# Patient Record
Sex: Female | Born: 1945 | Race: White | Hispanic: No | Marital: Married | Smoking: Former smoker
Health system: Southern US, Community
[De-identification: ages and names within clinical notes are randomized; demographics above are authoritative.]

## PROBLEM LIST (undated history)

## (undated) DIAGNOSIS — J329 Chronic sinusitis, unspecified: Secondary | ICD-10-CM

## (undated) DIAGNOSIS — I712 Thoracic aortic aneurysm, without rupture: Secondary | ICD-10-CM

## (undated) DIAGNOSIS — R112 Nausea with vomiting, unspecified: Secondary | ICD-10-CM

## (undated) DIAGNOSIS — H269 Unspecified cataract: Secondary | ICD-10-CM

## (undated) DIAGNOSIS — E785 Hyperlipidemia, unspecified: Secondary | ICD-10-CM

## (undated) DIAGNOSIS — I10 Essential (primary) hypertension: Secondary | ICD-10-CM

## (undated) DIAGNOSIS — F419 Anxiety disorder, unspecified: Secondary | ICD-10-CM

## (undated) DIAGNOSIS — M1990 Unspecified osteoarthritis, unspecified site: Secondary | ICD-10-CM

## (undated) DIAGNOSIS — G8929 Other chronic pain: Secondary | ICD-10-CM

## (undated) DIAGNOSIS — I6789 Other cerebrovascular disease: Secondary | ICD-10-CM

## (undated) DIAGNOSIS — K2990 Gastroduodenitis, unspecified, without bleeding: Secondary | ICD-10-CM

## (undated) DIAGNOSIS — E119 Type 2 diabetes mellitus without complications: Secondary | ICD-10-CM

## (undated) DIAGNOSIS — R0602 Shortness of breath: Secondary | ICD-10-CM

## (undated) DIAGNOSIS — Z9841 Cataract extraction status, right eye: Secondary | ICD-10-CM

## (undated) DIAGNOSIS — Z8673 Personal history of transient ischemic attack (TIA), and cerebral infarction without residual deficits: Secondary | ICD-10-CM

## (undated) DIAGNOSIS — R7989 Other specified abnormal findings of blood chemistry: Secondary | ICD-10-CM

## (undated) DIAGNOSIS — J309 Allergic rhinitis, unspecified: Secondary | ICD-10-CM

## (undated) DIAGNOSIS — I639 Cerebral infarction, unspecified: Secondary | ICD-10-CM

## (undated) DIAGNOSIS — K449 Diaphragmatic hernia without obstruction or gangrene: Secondary | ICD-10-CM

## (undated) DIAGNOSIS — R05 Cough: Secondary | ICD-10-CM

## (undated) DIAGNOSIS — F17200 Nicotine dependence, unspecified, uncomplicated: Secondary | ICD-10-CM

## (undated) DIAGNOSIS — T7840XA Allergy, unspecified, initial encounter: Secondary | ICD-10-CM

## (undated) DIAGNOSIS — M549 Dorsalgia, unspecified: Secondary | ICD-10-CM

## (undated) DIAGNOSIS — J069 Acute upper respiratory infection, unspecified: Secondary | ICD-10-CM

## (undated) DIAGNOSIS — G500 Trigeminal neuralgia: Secondary | ICD-10-CM

## (undated) DIAGNOSIS — I2510 Atherosclerotic heart disease of native coronary artery without angina pectoris: Secondary | ICD-10-CM

## (undated) DIAGNOSIS — D649 Anemia, unspecified: Secondary | ICD-10-CM

## (undated) DIAGNOSIS — I509 Heart failure, unspecified: Secondary | ICD-10-CM

## (undated) DIAGNOSIS — M5481 Occipital neuralgia: Secondary | ICD-10-CM

## (undated) DIAGNOSIS — F329 Major depressive disorder, single episode, unspecified: Secondary | ICD-10-CM

## (undated) DIAGNOSIS — K253 Acute gastric ulcer without hemorrhage or perforation: Secondary | ICD-10-CM

## (undated) DIAGNOSIS — N189 Chronic kidney disease, unspecified: Secondary | ICD-10-CM

## (undated) DIAGNOSIS — G459 Transient cerebral ischemic attack, unspecified: Secondary | ICD-10-CM

## (undated) DIAGNOSIS — D3000 Benign neoplasm of unspecified kidney: Secondary | ICD-10-CM

## (undated) DIAGNOSIS — J449 Chronic obstructive pulmonary disease, unspecified: Secondary | ICD-10-CM

## (undated) DIAGNOSIS — E559 Vitamin D deficiency, unspecified: Secondary | ICD-10-CM

## (undated) DIAGNOSIS — Z9842 Cataract extraction status, left eye: Secondary | ICD-10-CM

## (undated) DIAGNOSIS — M542 Cervicalgia: Secondary | ICD-10-CM

## (undated) DIAGNOSIS — G479 Sleep disorder, unspecified: Secondary | ICD-10-CM

## (undated) DIAGNOSIS — N183 Chronic kidney disease, stage 3 unspecified: Secondary | ICD-10-CM

## (undated) DIAGNOSIS — Z85528 Personal history of other malignant neoplasm of kidney: Secondary | ICD-10-CM

## (undated) DIAGNOSIS — K219 Gastro-esophageal reflux disease without esophagitis: Secondary | ICD-10-CM

## (undated) DIAGNOSIS — S42111A Displaced fracture of body of scapula, right shoulder, initial encounter for closed fracture: Secondary | ICD-10-CM

## (undated) DIAGNOSIS — K589 Irritable bowel syndrome without diarrhea: Secondary | ICD-10-CM

## (undated) DIAGNOSIS — Z7902 Long term (current) use of antithrombotics/antiplatelets: Secondary | ICD-10-CM

## (undated) DIAGNOSIS — I4719 Other supraventricular tachycardia: Secondary | ICD-10-CM

## (undated) DIAGNOSIS — G2581 Restless legs syndrome: Secondary | ICD-10-CM

## (undated) DIAGNOSIS — J439 Emphysema, unspecified: Secondary | ICD-10-CM

## (undated) DIAGNOSIS — R0683 Snoring: Secondary | ICD-10-CM

## (undated) DIAGNOSIS — Z86010 Personal history of colonic polyps: Secondary | ICD-10-CM

## (undated) DIAGNOSIS — I739 Peripheral vascular disease, unspecified: Secondary | ICD-10-CM

## (undated) DIAGNOSIS — Z8739 Personal history of other diseases of the musculoskeletal system and connective tissue: Secondary | ICD-10-CM

## (undated) DIAGNOSIS — F319 Bipolar disorder, unspecified: Secondary | ICD-10-CM

## (undated) DIAGNOSIS — I700 Atherosclerosis of aorta: Secondary | ICD-10-CM

## (undated) DIAGNOSIS — I5030 Unspecified diastolic (congestive) heart failure: Secondary | ICD-10-CM

## (undated) DIAGNOSIS — K2970 Gastritis, unspecified, without bleeding: Secondary | ICD-10-CM

## (undated) DIAGNOSIS — C801 Malignant (primary) neoplasm, unspecified: Secondary | ICD-10-CM

## (undated) DIAGNOSIS — R1310 Dysphagia, unspecified: Secondary | ICD-10-CM

## (undated) DIAGNOSIS — R42 Dizziness and giddiness: Secondary | ICD-10-CM

## (undated) HISTORY — DX: Trigeminal neuralgia: G50.0

## (undated) HISTORY — DX: Bipolar disorder, unspecified: F31.9

## (undated) HISTORY — DX: Acute gastric ulcer without hemorrhage or perforation: K25.3

## (undated) HISTORY — DX: Nicotine dependence, unspecified, uncomplicated: F17.200

## (undated) HISTORY — DX: Vitamin D deficiency, unspecified: E55.9

## (undated) HISTORY — DX: Unspecified osteoarthritis, unspecified site: M19.90

## (undated) HISTORY — DX: Allergy, unspecified, initial encounter: T78.40XA

## (undated) HISTORY — PX: RECTOCELE REPAIR: SHX761

## (undated) HISTORY — DX: Gastritis, unspecified, without bleeding: K29.70

## (undated) HISTORY — DX: Allergic rhinitis, unspecified: J30.9

## (undated) HISTORY — DX: Dizziness and giddiness: R42

## (undated) HISTORY — DX: Diaphragmatic hernia without obstruction or gangrene: K44.9

## (undated) HISTORY — DX: Sleep disorder, unspecified: G47.9

## (undated) HISTORY — DX: Anxiety disorder, unspecified: F41.9

## (undated) HISTORY — DX: Cough: R05

## (undated) HISTORY — DX: Restless legs syndrome: G25.81

## (undated) HISTORY — DX: Anemia, unspecified: D64.9

## (undated) HISTORY — DX: Dorsalgia, unspecified: M54.9

## (undated) HISTORY — DX: Chronic sinusitis, unspecified: J32.9

## (undated) HISTORY — DX: Atherosclerotic heart disease of native coronary artery without angina pectoris: I25.10

## (undated) HISTORY — PX: ESOPHAGOGASTRODUODENOSCOPY: SHX1529

## (undated) HISTORY — DX: Major depressive disorder, single episode, unspecified: F32.9

## (undated) HISTORY — PX: ORTHOPEDIC SURGERY: SHX850

## (undated) HISTORY — DX: Chronic kidney disease, unspecified: N18.9

## (undated) HISTORY — PX: CATARACT EXTRACTION: SUR2

## (undated) HISTORY — DX: Type 2 diabetes mellitus without complications: E11.9

## (undated) HISTORY — DX: Acute upper respiratory infection, unspecified: J06.9

## (undated) HISTORY — DX: Dysphagia, unspecified: R13.10

## (undated) HISTORY — PX: UPPER GASTROINTESTINAL ENDOSCOPY: SHX188

## (undated) HISTORY — DX: Personal history of other diseases of the musculoskeletal system and connective tissue: Z87.39

## (undated) HISTORY — DX: Gastroduodenitis, unspecified, without bleeding: K29.90

## (undated) HISTORY — DX: Thoracic aortic aneurysm, without rupture: I71.2

## (undated) HISTORY — DX: Occipital neuralgia: M54.81

## (undated) HISTORY — PX: CYSTOCELE REPAIR: SHX163

## (undated) HISTORY — DX: Unspecified cataract: H26.9

## (undated) HISTORY — DX: Other chronic pain: G89.29

## (undated) HISTORY — DX: Personal history of colonic polyps: Z86.010

## (undated) HISTORY — DX: Cervicalgia: M54.2

## (undated) HISTORY — DX: Shortness of breath: R06.02

## (undated) HISTORY — PX: COLONOSCOPY: SHX174

## (undated) HISTORY — PX: KIDNEY SURGERY: SHX687

## (undated) HISTORY — DX: Nausea with vomiting, unspecified: R11.2

## (undated) HISTORY — DX: Snoring: R06.83

## (undated) HISTORY — PX: APPENDECTOMY: SHX54

## (undated) HISTORY — DX: Personal history of transient ischemic attack (TIA), and cerebral infarction without residual deficits: Z86.73

## (undated) HISTORY — DX: Other specified abnormal findings of blood chemistry: R79.89

## (undated) HISTORY — DX: Irritable bowel syndrome without diarrhea: K58.9

## (undated) HISTORY — DX: Personal history of other malignant neoplasm of kidney: Z85.528

## (undated) HISTORY — PX: POLYPECTOMY: SHX149

## (undated) HISTORY — PX: ABDOMINAL HYSTERECTOMY: SHX81

---

## 1966-02-26 HISTORY — PX: TONSILECTOMY, ADENOIDECTOMY, BILATERAL MYRINGOTOMY AND TUBES: SHX2538

## 2004-01-25 ENCOUNTER — Encounter: Admission: RE | Admit: 2004-01-25 | Discharge: 2004-01-25 | Payer: Self-pay | Admitting: Internal Medicine

## 2004-02-16 ENCOUNTER — Encounter: Admission: RE | Admit: 2004-02-16 | Discharge: 2004-02-16 | Payer: Self-pay | Admitting: Orthopedic Surgery

## 2005-03-20 ENCOUNTER — Ambulatory Visit (HOSPITAL_COMMUNITY): Admission: AD | Admit: 2005-03-20 | Discharge: 2005-03-23 | Payer: Self-pay | Admitting: Orthopedic Surgery

## 2005-03-20 ENCOUNTER — Encounter (INDEPENDENT_AMBULATORY_CARE_PROVIDER_SITE_OTHER): Payer: Self-pay | Admitting: *Deleted

## 2006-04-08 ENCOUNTER — Inpatient Hospital Stay (HOSPITAL_COMMUNITY): Admission: EM | Admit: 2006-04-08 | Discharge: 2006-04-09 | Payer: Self-pay | Admitting: Emergency Medicine

## 2006-04-09 ENCOUNTER — Ambulatory Visit: Payer: Self-pay | Admitting: *Deleted

## 2007-03-26 ENCOUNTER — Encounter: Admission: RE | Admit: 2007-03-26 | Discharge: 2007-03-26 | Payer: Self-pay | Admitting: Family Medicine

## 2007-03-31 ENCOUNTER — Encounter: Admission: RE | Admit: 2007-03-31 | Discharge: 2007-03-31 | Payer: Self-pay | Admitting: Family Medicine

## 2007-04-04 ENCOUNTER — Ambulatory Visit: Payer: Self-pay | Admitting: Internal Medicine

## 2007-04-04 DIAGNOSIS — J309 Allergic rhinitis, unspecified: Secondary | ICD-10-CM | POA: Insufficient documentation

## 2007-04-04 DIAGNOSIS — G2581 Restless legs syndrome: Secondary | ICD-10-CM

## 2007-04-04 DIAGNOSIS — M549 Dorsalgia, unspecified: Secondary | ICD-10-CM

## 2007-04-04 DIAGNOSIS — F319 Bipolar disorder, unspecified: Secondary | ICD-10-CM | POA: Insufficient documentation

## 2007-04-04 DIAGNOSIS — M1990 Unspecified osteoarthritis, unspecified site: Secondary | ICD-10-CM

## 2007-04-04 DIAGNOSIS — J449 Chronic obstructive pulmonary disease, unspecified: Secondary | ICD-10-CM | POA: Insufficient documentation

## 2007-04-04 DIAGNOSIS — K589 Irritable bowel syndrome without diarrhea: Secondary | ICD-10-CM

## 2007-04-04 HISTORY — DX: Bipolar disorder, unspecified: F31.9

## 2007-04-04 HISTORY — DX: Unspecified osteoarthritis, unspecified site: M19.90

## 2007-04-04 HISTORY — DX: Allergic rhinitis, unspecified: J30.9

## 2007-04-04 HISTORY — DX: Dorsalgia, unspecified: M54.9

## 2007-04-04 HISTORY — DX: Restless legs syndrome: G25.81

## 2007-04-04 HISTORY — DX: Irritable bowel syndrome, unspecified: K58.9

## 2007-04-07 ENCOUNTER — Ambulatory Visit: Payer: Self-pay | Admitting: Internal Medicine

## 2007-04-07 DIAGNOSIS — K253 Acute gastric ulcer without hemorrhage or perforation: Secondary | ICD-10-CM | POA: Insufficient documentation

## 2007-04-07 DIAGNOSIS — K449 Diaphragmatic hernia without obstruction or gangrene: Secondary | ICD-10-CM

## 2007-04-07 DIAGNOSIS — K2990 Gastroduodenitis, unspecified, without bleeding: Secondary | ICD-10-CM

## 2007-04-07 DIAGNOSIS — K2970 Gastritis, unspecified, without bleeding: Secondary | ICD-10-CM

## 2007-04-07 HISTORY — DX: Gastritis, unspecified, without bleeding: K29.70

## 2007-04-07 HISTORY — DX: Acute gastric ulcer without hemorrhage or perforation: K25.3

## 2007-04-07 HISTORY — DX: Diaphragmatic hernia without obstruction or gangrene: K44.9

## 2007-05-13 ENCOUNTER — Ambulatory Visit: Payer: Self-pay | Admitting: Internal Medicine

## 2007-05-29 ENCOUNTER — Ambulatory Visit (HOSPITAL_COMMUNITY): Admission: RE | Admit: 2007-05-29 | Discharge: 2007-05-29 | Payer: Self-pay | Admitting: Internal Medicine

## 2007-05-29 ENCOUNTER — Ambulatory Visit: Payer: Self-pay | Admitting: Internal Medicine

## 2007-05-29 ENCOUNTER — Encounter: Payer: Self-pay | Admitting: Internal Medicine

## 2007-05-29 DIAGNOSIS — Z860100 Personal history of colon polyps, unspecified: Secondary | ICD-10-CM | POA: Insufficient documentation

## 2007-10-29 ENCOUNTER — Telehealth: Payer: Self-pay | Admitting: Internal Medicine

## 2007-11-19 ENCOUNTER — Encounter: Admission: RE | Admit: 2007-11-19 | Discharge: 2007-11-19 | Payer: Self-pay | Admitting: Urology

## 2007-12-17 ENCOUNTER — Ambulatory Visit: Payer: Self-pay | Admitting: Internal Medicine

## 2007-12-17 DIAGNOSIS — F17200 Nicotine dependence, unspecified, uncomplicated: Secondary | ICD-10-CM

## 2007-12-17 HISTORY — DX: Nicotine dependence, unspecified, uncomplicated: F17.200

## 2007-12-18 ENCOUNTER — Telehealth (INDEPENDENT_AMBULATORY_CARE_PROVIDER_SITE_OTHER): Payer: Self-pay

## 2007-12-24 ENCOUNTER — Ambulatory Visit: Payer: Self-pay | Admitting: Internal Medicine

## 2007-12-24 ENCOUNTER — Ambulatory Visit: Admission: RE | Admit: 2007-12-24 | Discharge: 2007-12-24 | Payer: Self-pay | Admitting: Internal Medicine

## 2007-12-24 DIAGNOSIS — R0602 Shortness of breath: Secondary | ICD-10-CM | POA: Insufficient documentation

## 2007-12-24 HISTORY — DX: Shortness of breath: R06.02

## 2007-12-29 ENCOUNTER — Ambulatory Visit: Payer: Self-pay | Admitting: Internal Medicine

## 2007-12-29 DIAGNOSIS — J329 Chronic sinusitis, unspecified: Secondary | ICD-10-CM | POA: Insufficient documentation

## 2007-12-29 HISTORY — DX: Chronic sinusitis, unspecified: J32.9

## 2008-02-02 ENCOUNTER — Ambulatory Visit (HOSPITAL_COMMUNITY): Admission: RE | Admit: 2008-02-02 | Discharge: 2008-02-03 | Payer: Self-pay | Admitting: Urology

## 2008-02-15 ENCOUNTER — Observation Stay (HOSPITAL_COMMUNITY): Admission: EM | Admit: 2008-02-15 | Discharge: 2008-02-16 | Payer: Self-pay | Admitting: Emergency Medicine

## 2008-02-25 ENCOUNTER — Emergency Department (HOSPITAL_COMMUNITY): Admission: EM | Admit: 2008-02-25 | Discharge: 2008-02-26 | Payer: Self-pay | Admitting: Emergency Medicine

## 2008-02-26 ENCOUNTER — Inpatient Hospital Stay (HOSPITAL_COMMUNITY): Admission: AD | Admit: 2008-02-26 | Discharge: 2008-03-03 | Payer: Self-pay | Admitting: Urology

## 2008-06-01 ENCOUNTER — Ambulatory Visit (HOSPITAL_COMMUNITY): Admission: RE | Admit: 2008-06-01 | Discharge: 2008-06-01 | Payer: Self-pay | Admitting: Urology

## 2008-11-02 ENCOUNTER — Encounter: Admission: RE | Admit: 2008-11-02 | Discharge: 2008-11-02 | Payer: Self-pay | Admitting: Urology

## 2008-12-10 ENCOUNTER — Inpatient Hospital Stay (HOSPITAL_COMMUNITY): Admission: RE | Admit: 2008-12-10 | Discharge: 2008-12-12 | Payer: Self-pay | Admitting: Urology

## 2009-01-07 ENCOUNTER — Ambulatory Visit (HOSPITAL_COMMUNITY): Admission: RE | Admit: 2009-01-07 | Discharge: 2009-01-08 | Payer: Self-pay | Admitting: Interventional Radiology

## 2009-01-07 ENCOUNTER — Encounter (INDEPENDENT_AMBULATORY_CARE_PROVIDER_SITE_OTHER): Payer: Self-pay | Admitting: Interventional Radiology

## 2009-02-02 ENCOUNTER — Telehealth (INDEPENDENT_AMBULATORY_CARE_PROVIDER_SITE_OTHER): Payer: Self-pay | Admitting: *Deleted

## 2009-02-04 ENCOUNTER — Ambulatory Visit (HOSPITAL_COMMUNITY): Admission: RE | Admit: 2009-02-04 | Discharge: 2009-02-04 | Payer: Self-pay | Admitting: Interventional Radiology

## 2009-02-08 ENCOUNTER — Encounter: Admission: RE | Admit: 2009-02-08 | Discharge: 2009-02-08 | Payer: Self-pay | Admitting: Interventional Radiology

## 2009-06-28 ENCOUNTER — Ambulatory Visit (HOSPITAL_COMMUNITY): Admission: RE | Admit: 2009-06-28 | Discharge: 2009-06-28 | Payer: Self-pay | Admitting: Interventional Radiology

## 2009-06-28 ENCOUNTER — Encounter: Admission: RE | Admit: 2009-06-28 | Discharge: 2009-06-28 | Payer: Self-pay | Admitting: Interventional Radiology

## 2009-06-30 ENCOUNTER — Ambulatory Visit: Payer: Self-pay | Admitting: Cardiology

## 2009-06-30 ENCOUNTER — Inpatient Hospital Stay (HOSPITAL_COMMUNITY): Admission: EM | Admit: 2009-06-30 | Discharge: 2009-07-05 | Payer: Self-pay | Admitting: Emergency Medicine

## 2009-07-01 ENCOUNTER — Ambulatory Visit: Payer: Self-pay | Admitting: Vascular Surgery

## 2009-07-01 ENCOUNTER — Ambulatory Visit: Payer: Self-pay | Admitting: Physical Medicine & Rehabilitation

## 2009-07-01 ENCOUNTER — Encounter (INDEPENDENT_AMBULATORY_CARE_PROVIDER_SITE_OTHER): Payer: Self-pay | Admitting: Pediatrics

## 2009-07-04 ENCOUNTER — Encounter (INDEPENDENT_AMBULATORY_CARE_PROVIDER_SITE_OTHER): Payer: Self-pay | Admitting: Neurology

## 2009-07-04 ENCOUNTER — Encounter (INDEPENDENT_AMBULATORY_CARE_PROVIDER_SITE_OTHER): Payer: Self-pay | Admitting: Pediatrics

## 2009-12-12 ENCOUNTER — Observation Stay (HOSPITAL_COMMUNITY): Admission: EM | Admit: 2009-12-12 | Discharge: 2009-12-16 | Payer: Self-pay | Admitting: Emergency Medicine

## 2009-12-12 ENCOUNTER — Ambulatory Visit: Payer: Self-pay | Admitting: Internal Medicine

## 2009-12-15 ENCOUNTER — Encounter: Payer: Self-pay | Admitting: Cardiology

## 2010-03-30 NOTE — Miscellaneous (Signed)
Summary: Orders Update pft charges  Clinical Lists Changes  Orders: Added new Service order of Carbon Monoxide diffusing w/capacity (94720) - Signed Added new Service order of Lung Volumes (94240) - Signed Added new Service order of Spirometry (Pre & Post) (94060) - Signed 

## 2010-03-30 NOTE — Assessment & Plan Note (Signed)
Summary: 6 min walk   Nurse Visit   Vital Signs:  Patient Profile:   65 Years Old Female Height:     65 inches Pulse rate:   73 / minute BP sitting:   132 / 72                 Prior Medications: ADVAIR DISKUS 500-50 MCG/DOSE MISC (FLUTICASONE-SALMETEROL) 1 puff once daily CRESTOR 10 MG TABS (ROSUVASTATIN CALCIUM) once daily NITROFURANTOIN MONOHYD MACRO 100 MG CAPS (NITROFURANTOIN MONOHYD MACRO) 1 tab two times a day VERAMYST 27.5 MCG/SPRAY SUSP (FLUTICASONE FUROATE) as needed FLONASE 50 MCG/ACT SUSP (FLUTICASONE PROPIONATE) as needed VENTOLIN HFA 108 (90 BASE) MCG/ACT AERS (ALBUTEROL SULFATE) as needed NEXIUM 20 MG CPDR (ESOMEPRAZOLE MAGNESIUM) once daily Current Allergies: No known allergies     Orders Added: 1)  Pulmonary Stress (6 min walk) [email or web address]    ]  Six Minute Walk Test Medications taken before test(dose and time): none this morning Supplemental oxygen during the test: No  Lap counter(place a tick mark inside a square for each lap completed) lap 1 complete  lap 2 complete   lap 3 complete   lap 4 complete  lap 5 complete  lap 6 complete  lap 7 complete    Baseline  BP sitting: 132/ 72 Heart rate: 73 Dyspnea ( Borg scale) 0 Fatigue (Borg scale) 0 SPO2 97  End Of Test  BP sitting: 134/ 74 Heart rate: 89 Dyspnea ( Borg scale) 0 Fatigue (Borg scale) 0 SPO2 97  2 Minutes post  BP sitting: 130/ 72 Heart rate: 76 SPO2 97  Stopped or paused before six minutes? No  Interpretation: Number of laps  7 X 48 meters =   336 meters =    meters   Total distance walked in six minutes: 336 meters  Tech ID: Tivis Ringer (December 24, 2007 11:44 AM) Jeremy Johann Comments pt completed test w/ 0 rest breaks and 0 complaints

## 2010-03-30 NOTE — Assessment & Plan Note (Signed)
Summary: pre-op clearance/apc   Visit Type:  Initial Consult Referred by:  Dr. Patsi [facility] PCP:  Dr. Kandis Mannan, Dr. [facility]  Chief Complaint:  pulmonary consult......Marland Kitchenreviewed meds......Marland Kitchenneeds clearnace for surgery.  History of Present Illness: IOV 12/17/2007: 65 year old heavy smoker, normal cxr Jan 2007, obese bmi 31, history of mold exposure in 2001 followed briefly at [facility], has had diagnosis of renal mass and urinary incontinence and vaginal prolapse per history. Due to undergo pelvic floor reconstruction surgery and presents for pre-operative evaluation.  She has chronic cough with some white sputum at baseline. Few weeks/months ago had URTI and had worsening of cough in the post-viral phase but now slowly returning to baseline. In addition, has chronic doe for 1 flight of stairs and lifting heavy objects at work. Also, has chronic sinus drainage. She continues to smoke and has had difficulty quitting. As part of pre-op pulm eval she says she had office spiro at PMD's office which showed "33%" and was placed on advair.   :  Comments: NOTE BELOW WAS ENTERED IN ERROR.......ON THE WRONG ACCT.   Serial Vital Signs/Assessments:  Comments: Ambulatory Pulse Oximetry  Resting; HR_75____    02 Sat_94____  Lap1 (185 feet)   HR_81____   02 Sat_94____ Lap2 (185 feet)   HR_84____   02 Sat_93____    Lap3 (185 feet)   HR_72____   02 Sat_94____  _x__Test Completed without Difficulty ___Test Stopped due to:  Clarise Cruz Duncan Dull)  December 17, 2007 3:06 PM  By: Clarise Cruz Duncan Dull)      Updated Prior Medication List: ADVAIR DISKUS 500-50 MCG/DOSE MISC (FLUTICASONE-SALMETEROL) 1 puff once daily CRESTOR 10 MG TABS (ROSUVASTATIN CALCIUM) once daily NITROFURANTOIN MONOHYD MACRO 100 MG CAPS (NITROFURANTOIN MONOHYD MACRO) 1 tab two times a day VERAMYST 27.5 MCG/SPRAY SUSP (FLUTICASONE FUROATE) as needed FLONASE 50 MCG/ACT SUSP (FLUTICASONE  PROPIONATE) as needed VENTOLIN HFA 108 (90 BASE) MCG/ACT AERS (ALBUTEROL SULFATE) as needed NEXIUM 20 MG CPDR (ESOMEPRAZOLE MAGNESIUM) once daily  Current Allergies: No known allergies   Past Medical History:    IRRITABLE BOWEL SYNDROME (ICD-564.1)    OSTEOARTHRITIS (ICD-715.90)    BACK PAIN, CHRONIC (ICD-724.5)    RHINOSINUSITIS, 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  Diabetes in her sister    and father.  Coronary artery bypass surgery in father and mother.    Thyroid problems in her mother.  No colon cancer.       Social History:    married lives with husband and son    2 dogs and 7 wolves    works at Product/process development scientist at [employer]      She is married.  She works in Investment banker, operational at [employer].    [place].  She continues to smoke a pack per day  There is no    alcohol or drug use.    Risk Factors:  Tobacco use:  current    Year started:  35 yrs  Cigarettes:  Yes -- 2 pack(s) per day Alcohol use:  no   Review of Systems       The patient complains of dyspnea on exertion, prolonged cough, severe indigestion/heartburn, and incontinence.  The patient denies anorexia, fever, weight loss, weight gain, vision loss, decreased hearing, hoarseness, chest pain, syncope, peripheral edema, headaches, hemoptysis, abdominal pain, melena, hematochezia, hematuria, genital sores, muscle weakness, suspicious skin lesions, transient blindness, difficulty walking, depression, unusual weight change, abnormal bleeding, enlarged lymph nodes, angioedema, breast masses, and testicular masses.     Vital  Signs:  Patient Profile:   65 Years Old Female Height:     65 inches Weight:      186.25 pounds BMI:     31.11 O2 Sat:      94 % O2 treatment:    Room Air Temp:     97.8 degrees F oral Pulse rate:   82 / minute BP sitting:   110 / 70  (left arm) Cuff size:   regular  Pt. in pain?   no  Vitals Entered By: Clarise Cruz Duncan Dull) (December 17, 2007 2:35 PM)                  Physical Exam  General:     well developed, well nourished, in no acute distressobese.   Head:     normocephalic and atraumatic Eyes:     PERRLA/EOM intact; conjunctiva and sclera clear Ears:     TMs intact and clear with normal canals Nose:     no deformity, discharge, inflammation, or lesions Mouth:     no deformity or lesions Neck:     no masses, thyromegaly, or abnormal cervical nodes Chest Wall:     no deformities noted Lungs:     coarse BS throughout and prolonged exhilation.   Heart:     regular rate and rhythm, S1, S2 without murmurs, rubs, gallops, or clicks Abdomen:     bowel sounds positive; abdomen soft and non-tender without masses, or organomegaly Msk:     no deformity or scoliosis noted with normal posture Pulses:     pulses normal Extremities:     no clubbing, cyanosis, edema, or deformity noted Neurologic:     CN II-XII grossly intact with normal reflexes, coordination, muscle strength and tone Skin:     intact without lesions or rashes Cervical Nodes:     no significant adenopathy Axillary Nodes:     no significant adenopathy Psych:     alert and cooperative; normal mood and affect; normal attention span and concentration    Pulmonary Function Test Height (in.): 65 Gender: Female    Impression & Recommendations:  Problem # 1:  PREOPERATIVE EXAMINATION (ICD-V72.84) Assessment: New Smoker, obese, likely copd, age 56: already at moderate risk for pulmonary complications from surgery. She ideally needs to quit smoking for >6 weeks prior to surgery but she is  unwilling to do that because she is keen to have surgery soon  plan get full pfts get 6 minute walk test get abg on room air better risk quantification after above tests continue advair    Orders: Consultation Level V (21308) Pulmonary Referral (Pulmonary) Consultation Level V (65784)   Problem # 2:  COPD (ICD-496) get pft's to diagnose and assess severity Her updated medication list for this problem includes:    Advair Diskus 500-50 Mcg/dose Misc (Fluticasone-salmeterol) .[name]... 1 puff once daily    Ventolin Hfa 108 (90 Base) Mcg/act Aers (Albuterol sulfate) .[name]... As needed  Orders: Consultation Level V [id number]) Pulmonary Referral (Pulmonary) Consultation Level V (32951)   Problem # 5:  TOBACCO ABUSE (ICD-305.1) Assessment: New spent 5 minutes counselling on hazards of smoking. She is unwilling to quit smoking as of now. She feels she will get nicotine patch and chantix in post-op period and that should suffice  plan will continue to encourage quitting smoking pre-op Orders: Consultation Level V (88416)   Medications Added to Medication List This Visit: 1)  Advair Diskus 500-50 Mcg/dose Misc (Fluticasone-salmeterol) .[name].. 1 puff once daily 2)  Crestor 10 Mg Tabs (Rosuvastatin calcium) .... Once daily 3)  Nitrofurantoin Monohyd Macro 100 Mg Caps (Nitrofurantoin monohyd macro) .[name].. 1 tab two times a day 4)  Veramyst 27.5 Mcg/spray Susp (Fluticasone furoate) .... As needed 5)  Flonase 50 Mcg/act Susp (Fluticasone propionate) .... As needed 6)  Ventolin Hfa 108 (90 Base) Mcg/act Aers (Albuterol sulfate) .... As needed 7)  Nexium 20 Mg Cpdr (Esomeprazole magnesium) .... Once daily   Patient Instructions: 1)  please have full pfts and walking test at [place] office 2)  pleaes have blood gas test at [facility] 3)  return after above   ]

## 2010-03-30 NOTE — Op Note (Signed)
Summary: EGD    Upper Endoscopy  Procedure date:  04/07/2007  Findings:      1) MULTIPLE ANTRAL ULCERS AND PYLORIC INFLAMMATION. I THINK THEY ARE FROM NSAIDS BASED UPON SIZE, #, APPEARANCE AND HX OF NSAID USE. H. PYLORI AB WAS ALREADY NEGATIVE. 2) 2 CM HIATAL HERNIA 3) OTHERWISE NEGATIVE  Comments:      STOP NSAIDS TAKE NEXIUM 2 TIMES/DAY FOR 1 MONTH RETURN OFFICE VISIT 6-8 WKS

## 2010-03-30 NOTE — Progress Notes (Signed)
Summary: appt  Phone Note Call from Patient   Caller: Patient Call For: [email or web address] Summary of Call: returning your call  Initial call taken by: Rickard Patience,  December 18, 2007 8:26 AM  Follow-up for Phone Call        scheduled pt to see MR on 11/2 at 12:00 Follow-up by: Philipp Deputy CMA,  December 18, 2007 11:38 AM

## 2010-03-30 NOTE — Progress Notes (Signed)
Summary: ULCER ATTACK   Phone Note Call from Patient Call back at [phone] WORK #   Call For: DR Obbie Lewallen Reason for Call: Talk to Nurse Summary of Call: HAVING ULCER ATTACKS. WANTS TO KNOW WHAT TO TAKE TO EASE THE PAIN. USES [facility] ON [address]. Initial call taken by: Leanor [facility],  October 29, 2007 9:14 AM  Follow-up for Phone Call        pt having reflux and pain, Pt not taking nexium properly.  Pt instructed to maintain anti-reflux diet. Advised to avoid caffeine, mint, citrus foods/juices, tomatoes, chocolate. Instructed not to eat within 2 hours of exercise or bed, small meals are better than 3 large. Need to take PPI 30 minutes prior to 1st meal of the day and at bedtime.  Pt instructed to call back if no improvment with dietary and medication changes.  . Follow-up by: Darcey Nora RN,  October 29, 2007 10:57 AM

## 2010-03-30 NOTE — Assessment & Plan Note (Signed)
Summary: rov/to discuss results//td   Visit Type:  Follow-up Referred by:  Dr. Patsi [facility] PCP:  Dr. Kandis Mannan, Dr. [facility]  Chief Complaint:  fu visit.....Kerry Kitchenno problems...reviewed meds...Kerry Kitchenneeds surgery for clearance.......  History of Present Illness: IOV 12/17/2007: 65 year old heavy smoker, normal cxr Jan 2007, obese bmi 31, history of mold exposure in 2001 followed briefly at [facility], has had diagnosis of renal mass and urinary incontinence and vaginal prolapse per history. Due to undergo pelvic floor reconstruction surgery and presents for pre-operative evaluation.  She has chronic cough with some white sputum at baseline. Few weeks/months ago had URTI and had worsening of cough in the post-viral phase but now slowly returning to baseline. In addition, has chronic doe for 1 flight of stairs and lifting heavy objects at work. Also, has chronic sinus drainage. She continues to smoke and has had difficulty quitting. As part of pre-op pulm eval she says she had office spiro at PMD's office which showed "33%" and was placed on advair. REC: PFts, , abg prior to surgery  Followup OV: 12/29/2007: No new symptoms. Stil with DOE for 1 flight stairs; taking advair. Still with sinus drainge despite flonase and veramyst. Here to review test results.  Presents with husband.  Full PFTs 12/24/2007 - is normal. FEv1 2.43/106%, FVC 3.35/107%, Ratio 73 73), TLC 4.4L/86%, DLCO 18/79%. 6 minute walk test 12/24/2007 is normal - 336 meters, no desaturation, no dyspnea/fatigue, normal heart rate and bp response is normal. ABG on room air 12/24/2007  - shows only hypoxemia for age;  31.42/40/65     Updated Prior Medication List: ADVAIR DISKUS 500-50 MCG/DOSE MISC (FLUTICASONE-SALMETEROL) 1 puff once daily CRESTOR 10 MG TABS (ROSUVASTATIN CALCIUM) once daily NITROFURANTOIN MONOHYD MACRO 100 MG CAPS (NITROFURANTOIN MONOHYD MACRO) 1 tab two times a day VERAMYST 27.5 MCG/SPRAY SUSP  (FLUTICASONE FUROATE) as needed FLONASE 50 MCG/ACT SUSP (FLUTICASONE PROPIONATE) as needed VENTOLIN HFA 108 (90 BASE) MCG/ACT AERS (ALBUTEROL SULFATE) as needed NEXIUM 20 MG CPDR (ESOMEPRAZOLE MAGNESIUM) once daily  Current Allergies (reviewed today): No known allergies   Past Medical History:    IRRITABLE BOWEL SYNDROME (ICD-564.1)    OSTEOARTHRITIS (ICD-715.90)    BACK PAIN, CHRONIC (ICD-724.5)    RHINOSINUSITIS, 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 Incomplete Colonoscopy 2005   Family History:    Reviewed history from 12/17/2007 and no changes required:       heart attack- father       Ovarian cancer-mother       asthma-mother       allergies-sister                Ovarian cancer in her mother.  Diabetes in her sister       and father.  Coronary artery bypass surgery in father and mother.       Thyroid problems in her mother.  No colon cancer.  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  She works in Investment banker, operational at [employer].       [place].  She continues to smoke a pack per day  There is no       alcohol or drug use.    Risk Factors: Tobacco use:  current    Year started:  35 yrs    Cigarettes:  Yes -- 2 pack(s) per day Alcohol use:  no   Review of Systems      See HPI   Vital Signs:  Patient Profile:   65 Years Old Female Height:     65 inches Weight:       188.50 pounds BMI:     31.48 O2 Sat:      95 % O2 treatment:    Room Air Temp:     97.7 degrees F oral Pulse rate:   79 / minute BP sitting:   120 / 60  (left arm) Cuff size:   regular  Pt. in pain?   no  Vitals Entered By: Clarise Cruz Duncan Dull) (December 29, 2007 12:32 PM)                  Physical Exam  General:     well developed, well nourished, in no acute distressobese.   Head:     normocephalic and atraumatic Eyes:     PERRLA/EOM intact; conjunctiva and sclera clear Ears:     TMs intact and clear with normal canals Nose:     no deformity, discharge, inflammation, or lesions Mouth:     no deformity or lesions Neck:     no masses, thyromegaly, or abnormal cervical nodes Chest Wall:     no deformities noted Lungs:     coarse BS throughout and prolonged exhilation.   Heart:     regular rate and rhythm, S1, S2 without murmurs, rubs, gallops, or clicks Abdomen:     bowel sounds positive; abdomen soft and non-tender without masses, or organomegaly Msk:     no deformity or scoliosis noted with normal posture Pulses:     pulses normal Extremities:     no clubbing, cyanosis, edema, or deformity noted Neurologic:     CN II-XII grossly intact with normal reflexes, coordination, muscle strength and tone Skin:     intact without lesions or rashes Cervical Nodes:     no significant adenopathy Axillary Nodes:     no significant adenopathy Psych:     alert and cooperative; normal mood and affect; normal attention span and concentration      Impression & Recommendations:  Problem # 1:  PREOPERATIVE EXAMINATION (ICD-V72.84) Assessment: Unchanged AGe >38, Smoking, Obesity seem the only risk factors for pulmonary complications following surgery. If she has upper abdominal surgery incision then that is an addeded risk factor. Alleviating risk factors are hx of normal nutritional, renal status, and independent ADLs. No evidence of COPD on PFT 12/24/2007 (? advair  effet). Arozulah score for post-operative pulmonary complication is 4. If she has upper abdominal incision then score is 18. This translates into 0.5% - 2% risk for major pulmonary complications following surgery. This is LOW-INTERMEDIATE RISK.  She ideally needs  to quit smoking for >6 weeks prior to surgery but she is unwilling to do that because she is keen to have surgery soon  plan Low risk for pulmonary complicationss Continue advair till prior to surgery and hten in post-op period Immeidate post-op admin nebs; albuterol/atrovent  DVT prophylaxis with heparin/lovenox post-op aggressive incentive spirometry to prevent atelectasis    Orders: Consultation Level V (29562) Pulmonary Referral (Pulmonary)  Orders: Est. Patient Level III (13086) Tobacco use cessation intermediate 3-10 minutes (57846)   Problem # 2:  TOBACCO ABUSE (ICD-305.1) Assessment: Unchanged spent 5 minutescounselling on hazards of smoking. She is unwilling to quit smoking as of now. She feels she will get nicotine patch and chantix in post-op period and that should suffice  plan will continue to encourage quitting smoking pre-op  Orders: Est. Patient Level III (96295) Tobacco use cessation intermediate 3-10 minutes (28413)   Problem # 3:  COPD (ICD-496) Assessment: Unchanged No evidence of copd on pft's. ? normal despite smoking hx v ? advair effect  plan continue advair followup and reassess in post surgical followupo Her updated medication list for this problem includes:    Advair Diskus 500-50 Mcg/dose Misc (Fluticasone-salmeterol) .Kerry Powell... 1 puff once daily    Ventolin Hfa 108 (90 Base) Mcg/act Aers (Albuterol sulfate) .Kerry Powell... As needed   Problem # 4:  DYSPNEA (ICD-786.05) Assessment: Unchanged 1 flight. ? due to obesity  plan reassess after surgery  Orders: Est. 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 As needed  Her updated medication list for this problem includes:    Flonase 50 Mcg/act Susp (Fluticasone propionate) .Kerry Powell... As needed    Chlorphen 4 Mg Tabs (Chlorpheniramine maleate) .Kerry Powell... Take 2 tabets at bedtime  Orders: Est. Patient Level III (02725)   Medications Added to Medication List This Visit: 1)  Chlorphen 4 Mg Tabs (Chlorpheniramine maleate) .... Take 2 tabets at bedtime   Patient Instructions: 1)  Stop veramyst 2)  Start chlorpheniramine 2 tabs at bedtime; if too drowsy cut down to 1 tablet at bedtime 3)  Continue flonase scheduled 4)  Contineu advair scheduled 5)  Take your other medicines 6)  If possible, stop smoking and have surgery in 6 weeks 7)  You are at low risk from pulmonary complications from surgery 8)  I wil make specific recommendations for the Urologist for your surgery 9)  Followup 2 months   Prescriptions: CHLORPHEN 4 MG TABS (CHLORPHENIRAMINE MALEATE) take 2 tabets at bedtime  #60 x 1   Entered and Authorized by:   Kalman Shan MD   Signed by:   Kalman Shan MD on 12/29/2007   Method used:   Print then Give to Patient   RxID:   [id number]  ]

## 2010-05-10 LAB — CBC
HCT: 34.7 % — ABNORMAL LOW (ref 36.0–46.0)
HCT: 36.1 % (ref 36.0–46.0)
HCT: 40.1 % (ref 36.0–46.0)
Hemoglobin: 11.3 g/dL — ABNORMAL LOW (ref 12.0–15.0)
Hemoglobin: 12.1 g/dL (ref 12.0–15.0)
Hemoglobin: 13.4 g/dL (ref 12.0–15.0)
MCH: 30.8 pg (ref 26.0–34.0)
MCH: 31.5 pg (ref 26.0–34.0)
MCH: 31.8 pg (ref 26.0–34.0)
MCHC: 32.6 g/dL (ref 30.0–36.0)
MCHC: 33.4 g/dL (ref 30.0–36.0)
MCHC: 33.5 g/dL (ref 30.0–36.0)
MCV: 94 fL (ref 78.0–100.0)
MCV: 94.6 fL (ref 78.0–100.0)
MCV: 95.2 fL (ref 78.0–100.0)
Platelets: 319 10*3/uL (ref 150–400)
Platelets: 348 10*3/uL (ref 150–400)
Platelets: 369 10*3/uL (ref 150–400)
RBC: 3.67 MIL/uL — ABNORMAL LOW (ref 3.87–5.11)
RBC: 3.84 MIL/uL — ABNORMAL LOW (ref 3.87–5.11)
RBC: 4.21 MIL/uL (ref 3.87–5.11)
RDW: 12.4 % (ref 11.5–15.5)
RDW: 12.4 % (ref 11.5–15.5)
RDW: 12.4 % (ref 11.5–15.5)
WBC: 10 10*3/uL (ref 4.0–10.5)
WBC: 12 10*3/uL — ABNORMAL HIGH (ref 4.0–10.5)
WBC: 9.5 10*3/uL (ref 4.0–10.5)

## 2010-05-10 LAB — COMPREHENSIVE METABOLIC PANEL
ALT: 14 U/L (ref 0–35)
ALT: 14 U/L (ref 0–35)
AST: 18 U/L (ref 0–37)
AST: 21 U/L (ref 0–37)
Albumin: 3.7 g/dL (ref 3.5–5.2)
Albumin: 4.4 g/dL (ref 3.5–5.2)
Alkaline Phosphatase: 100 U/L (ref 39–117)
Alkaline Phosphatase: 84 U/L (ref 39–117)
BUN: 22 mg/dL (ref 6–23)
BUN: 27 mg/dL — ABNORMAL HIGH (ref 6–23)
CO2: 22 mEq/L (ref 19–32)
CO2: 24 mEq/L (ref 19–32)
Calcium: 8.9 mg/dL (ref 8.4–10.5)
Calcium: 9.6 mg/dL (ref 8.4–10.5)
Chloride: 101 mEq/L (ref 96–112)
Chloride: 104 mEq/L (ref 96–112)
Creatinine, Ser: 1.54 mg/dL — ABNORMAL HIGH (ref 0.4–1.2)
Creatinine, Ser: 1.86 mg/dL — ABNORMAL HIGH (ref 0.4–1.2)
GFR calc Af Amer: 33 mL/min — ABNORMAL LOW (ref >60)
GFR calc Af Amer: 41 mL/min — ABNORMAL LOW (ref >60)
GFR calc non Af Amer: 27 mL/min — ABNORMAL LOW (ref >60)
GFR calc non Af Amer: 34 mL/min — ABNORMAL LOW (ref >60)
Glucose, Bld: 120 mg/dL — ABNORMAL HIGH (ref 70–99)
Glucose, Bld: 87 mg/dL (ref 70–99)
Potassium: 4 mEq/L (ref 3.5–5.1)
Potassium: 4.5 mEq/L (ref 3.5–5.1)
Sodium: 133 mEq/L — ABNORMAL LOW (ref 135–145)
Sodium: 135 mEq/L (ref 135–145)
Total Bilirubin: 0.7 mg/dL (ref 0.3–1.2)
Total Bilirubin: 0.9 mg/dL (ref 0.3–1.2)
Total Protein: 7.2 g/dL (ref 6.0–8.3)
Total Protein: 8.3 g/dL (ref 6.0–8.3)

## 2010-05-10 LAB — PHOSPHORUS: Phosphorus: 4 mg/dL (ref 2.3–4.6)

## 2010-05-10 LAB — URINALYSIS, ROUTINE W REFLEX MICROSCOPIC
Bilirubin Urine: NEGATIVE
Glucose, UA: NEGATIVE mg/dL
Hgb urine dipstick: NEGATIVE
Ketones, ur: NEGATIVE mg/dL
Nitrite: NEGATIVE
Protein, ur: NEGATIVE mg/dL
Specific Gravity, Urine: 1.03 (ref 1.005–1.030)
Urobilinogen, UA: 0.2 mg/dL (ref 0.0–1.0)
pH: 5.5 (ref 5.0–8.0)

## 2010-05-10 LAB — CARDIAC PANEL(CRET KIN+CKTOT+MB+TROPI)
CK, MB: 1 ng/mL (ref 0.3–4.0)
CK, MB: 1.1 ng/mL (ref 0.3–4.0)
CK, MB: 1.1 ng/mL (ref 0.3–4.0)
Relative Index: INVALID (ref 0.0–2.5)
Relative Index: INVALID (ref 0.0–2.5)
Relative Index: INVALID (ref 0.0–2.5)
Total CK: 61 U/L (ref 7–177)
Total CK: 64 U/L (ref 7–177)
Total CK: 66 U/L (ref 7–177)
Troponin I: <0.01 ng/mL (ref 0.00–0.06)
Troponin I: <0.01 ng/mL (ref 0.00–0.06)
Troponin I: <0.01 ng/mL (ref 0.00–0.06)

## 2010-05-10 LAB — TSH: TSH: 2.351 u[IU]/mL (ref 0.350–4.500)

## 2010-05-10 LAB — MAGNESIUM: Magnesium: 2.1 mg/dL (ref 1.5–2.5)

## 2010-05-10 LAB — BASIC METABOLIC PANEL
BUN: 13 mg/dL (ref 6–23)
CO2: 24 mEq/L (ref 19–32)
Calcium: 8.8 mg/dL (ref 8.4–10.5)
Chloride: 102 mEq/L (ref 96–112)
Creatinine, Ser: 1.57 mg/dL — ABNORMAL HIGH (ref 0.4–1.2)
GFR calc Af Amer: 40 mL/min — ABNORMAL LOW (ref >60)
GFR calc non Af Amer: 33 mL/min — ABNORMAL LOW (ref >60)
Glucose, Bld: 100 mg/dL — ABNORMAL HIGH (ref 70–99)
Potassium: 4 mEq/L (ref 3.5–5.1)
Sodium: 136 mEq/L (ref 135–145)

## 2010-05-10 LAB — URINE MICROSCOPIC-ADD ON

## 2010-05-10 LAB — HEMOGLOBIN A1C
Hgb A1c MFr Bld: 5.9 % — ABNORMAL HIGH (ref <5.7)
Mean Plasma Glucose: 123 mg/dL — ABNORMAL HIGH (ref <117)

## 2010-05-10 LAB — POCT CARDIAC MARKERS
CKMB, poc: <1 ng/mL — ABNORMAL LOW (ref 1.0–8.0)
Myoglobin, poc: 105 ng/mL (ref 12–200)
Troponin i, poc: <0.05 ng/mL (ref 0.00–0.09)

## 2010-05-10 LAB — DIFFERENTIAL
Basophils Absolute: 0 10*3/uL (ref 0.0–0.1)
Basophils Relative: 0 % (ref 0–1)
Eosinophils Absolute: 0.3 10*3/uL (ref 0.0–0.7)
Eosinophils Relative: 3 % (ref 0–5)
Lymphocytes Relative: 27 % (ref 12–46)
Lymphs Abs: 3.2 10*3/uL (ref 0.7–4.0)
Monocytes Absolute: 1.1 10*3/uL — ABNORMAL HIGH (ref 0.1–1.0)
Monocytes Relative: 9 % (ref 3–12)
Neutro Abs: 7.3 10*3/uL (ref 1.7–7.7)
Neutrophils Relative %: 61 % (ref 43–77)

## 2010-05-10 LAB — LIPID PANEL
Cholesterol: 132 mg/dL (ref 0–200)
HDL: 32 mg/dL — ABNORMAL LOW (ref >39)
LDL Cholesterol: 60 mg/dL (ref 0–99)
Total CHOL/HDL Ratio: 4.1 RATIO
Triglycerides: 199 mg/dL — ABNORMAL HIGH (ref <150)
VLDL: 40 mg/dL (ref 0–40)

## 2010-05-16 LAB — BASIC METABOLIC PANEL
BUN: 12 mg/dL (ref 6–23)
BUN: 16 mg/dL (ref 6–23)
BUN: 19 mg/dL (ref 6–23)
CO2: 26 mEq/L (ref 19–32)
CO2: 27 mEq/L (ref 19–32)
CO2: 27 mEq/L (ref 19–32)
Calcium: 8.8 mg/dL (ref 8.4–10.5)
Calcium: 9.1 mg/dL (ref 8.4–10.5)
Calcium: 9.5 mg/dL (ref 8.4–10.5)
Chloride: 102 mEq/L (ref 96–112)
Chloride: 104 mEq/L (ref 96–112)
Chloride: 104 mEq/L (ref 96–112)
Creatinine, Ser: 0.79 mg/dL (ref 0.4–1.2)
Creatinine, Ser: 1.18 mg/dL (ref 0.4–1.2)
Creatinine, Ser: 1.3 mg/dL — ABNORMAL HIGH (ref 0.4–1.2)
GFR calc Af Amer: 50 mL/min — ABNORMAL LOW (ref >60)
GFR calc Af Amer: 56 mL/min — ABNORMAL LOW (ref >60)
GFR calc Af Amer: >60 mL/min (ref >60)
GFR calc non Af Amer: 41 mL/min — ABNORMAL LOW (ref >60)
GFR calc non Af Amer: 46 mL/min — ABNORMAL LOW (ref >60)
GFR calc non Af Amer: >60 mL/min (ref >60)
Glucose, Bld: 102 mg/dL — ABNORMAL HIGH (ref 70–99)
Glucose, Bld: 108 mg/dL — ABNORMAL HIGH (ref 70–99)
Glucose, Bld: 94 mg/dL (ref 70–99)
Potassium: 3.4 mEq/L — ABNORMAL LOW (ref 3.5–5.1)
Potassium: 4.1 mEq/L (ref 3.5–5.1)
Potassium: 4.2 mEq/L (ref 3.5–5.1)
Sodium: 137 mEq/L (ref 135–145)
Sodium: 138 mEq/L (ref 135–145)
Sodium: 139 mEq/L (ref 135–145)

## 2010-05-16 LAB — GLUCOSE, CAPILLARY
Glucose-Capillary: 102 mg/dL — ABNORMAL HIGH (ref 70–99)
Glucose-Capillary: 107 mg/dL — ABNORMAL HIGH (ref 70–99)
Glucose-Capillary: 109 mg/dL — ABNORMAL HIGH (ref 70–99)
Glucose-Capillary: 109 mg/dL — ABNORMAL HIGH (ref 70–99)
Glucose-Capillary: 114 mg/dL — ABNORMAL HIGH (ref 70–99)
Glucose-Capillary: 115 mg/dL — ABNORMAL HIGH (ref 70–99)
Glucose-Capillary: 118 mg/dL — ABNORMAL HIGH (ref 70–99)
Glucose-Capillary: 118 mg/dL — ABNORMAL HIGH (ref 70–99)
Glucose-Capillary: 123 mg/dL — ABNORMAL HIGH (ref 70–99)
Glucose-Capillary: 129 mg/dL — ABNORMAL HIGH (ref 70–99)
Glucose-Capillary: 130 mg/dL — ABNORMAL HIGH (ref 70–99)
Glucose-Capillary: 155 mg/dL — ABNORMAL HIGH (ref 70–99)
Glucose-Capillary: 94 mg/dL (ref 70–99)
Glucose-Capillary: 98 mg/dL (ref 70–99)
Glucose-Capillary: 99 mg/dL (ref 70–99)

## 2010-05-16 LAB — DIFFERENTIAL
Basophils Absolute: 0 10*3/uL (ref 0.0–0.1)
Basophils Relative: 0 % (ref 0–1)
Eosinophils Absolute: 0 10*3/uL (ref 0.0–0.7)
Eosinophils Relative: 0 % (ref 0–5)
Lymphocytes Relative: 11 % — ABNORMAL LOW (ref 12–46)
Lymphs Abs: 1.7 10*3/uL (ref 0.7–4.0)
Monocytes Absolute: 0.4 10*3/uL (ref 0.1–1.0)
Monocytes Relative: 2 % — ABNORMAL LOW (ref 3–12)
Neutro Abs: 13.8 10*3/uL — ABNORMAL HIGH (ref 1.7–7.7)
Neutrophils Relative %: 87 % — ABNORMAL HIGH (ref 43–77)

## 2010-05-16 LAB — CBC
HCT: 43.9 % (ref 36.0–46.0)
Hemoglobin: 15 g/dL (ref 12.0–15.0)
MCHC: 34.1 g/dL (ref 30.0–36.0)
MCV: 94.1 fL (ref 78.0–100.0)
Platelets: 222 10*3/uL (ref 150–400)
RBC: 4.67 MIL/uL (ref 3.87–5.11)
RDW: 13.4 % (ref 11.5–15.5)
WBC: 15.9 10*3/uL — ABNORMAL HIGH (ref 4.0–10.5)

## 2010-05-16 LAB — URINALYSIS, ROUTINE W REFLEX MICROSCOPIC
Bilirubin Urine: NEGATIVE
Glucose, UA: NEGATIVE mg/dL
Hgb urine dipstick: NEGATIVE
Ketones, ur: NEGATIVE mg/dL
Nitrite: NEGATIVE
Protein, ur: NEGATIVE mg/dL
Specific Gravity, Urine: 1.008 (ref 1.005–1.030)
Urobilinogen, UA: 0.2 mg/dL (ref 0.0–1.0)
pH: 7.5 (ref 5.0–8.0)

## 2010-05-16 LAB — COMPREHENSIVE METABOLIC PANEL
ALT: 15 U/L (ref 0–35)
AST: 17 U/L (ref 0–37)
Albumin: 4 g/dL (ref 3.5–5.2)
Alkaline Phosphatase: 94 U/L (ref 39–117)
BUN: 12 mg/dL (ref 6–23)
CO2: 26 mEq/L (ref 19–32)
Calcium: 8.9 mg/dL (ref 8.4–10.5)
Chloride: 103 mEq/L (ref 96–112)
Creatinine, Ser: 0.68 mg/dL (ref 0.4–1.2)
GFR calc Af Amer: >60 mL/min (ref >60)
GFR calc non Af Amer: >60 mL/min (ref >60)
Glucose, Bld: 116 mg/dL — ABNORMAL HIGH (ref 70–99)
Potassium: 3.7 mEq/L (ref 3.5–5.1)
Sodium: 136 mEq/L (ref 135–145)
Total Bilirubin: 0.5 mg/dL (ref 0.3–1.2)
Total Protein: 7.5 g/dL (ref 6.0–8.3)

## 2010-05-16 LAB — LIPID PANEL
Cholesterol: 286 mg/dL — ABNORMAL HIGH (ref 0–200)
HDL: 32 mg/dL — ABNORMAL LOW (ref >39)
LDL Cholesterol: 199 mg/dL — ABNORMAL HIGH (ref 0–99)
Total CHOL/HDL Ratio: 8.9 RATIO
Triglycerides: 276 mg/dL — ABNORMAL HIGH (ref <150)
VLDL: 55 mg/dL — ABNORMAL HIGH (ref 0–40)

## 2010-05-16 LAB — TROPONIN I: Troponin I: 0.02 ng/mL (ref 0.00–0.06)

## 2010-05-16 LAB — HEMOGLOBIN A1C
Hgb A1c MFr Bld: 5.9 % — ABNORMAL HIGH (ref <5.7)
Mean Plasma Glucose: 123 mg/dL — ABNORMAL HIGH (ref <117)

## 2010-05-16 LAB — PROTIME-INR
INR: 0.91 (ref 0.00–1.49)
Prothrombin Time: 12.2 seconds (ref 11.6–15.2)

## 2010-05-16 LAB — CK TOTAL AND CKMB (NOT AT ARMC)
CK, MB: 2.4 ng/mL (ref 0.3–4.0)
Relative Index: 2.4 (ref 0.0–2.5)
Total CK: 102 U/L (ref 7–177)

## 2010-05-16 LAB — APTT: aPTT: 35 seconds (ref 24–37)

## 2010-05-22 ENCOUNTER — Other Ambulatory Visit: Payer: Self-pay | Admitting: Interventional Radiology

## 2010-05-22 DIAGNOSIS — N2889 Other specified disorders of kidney and ureter: Secondary | ICD-10-CM

## 2010-05-31 LAB — CROSSMATCH
ABO/RH(D): A POS
Antibody Screen: NEGATIVE

## 2010-05-31 LAB — CBC
HCT: 36.3 % (ref 36.0–46.0)
HCT: 41 % (ref 36.0–46.0)
Hemoglobin: 12.6 g/dL (ref 12.0–15.0)
Hemoglobin: 13.8 g/dL (ref 12.0–15.0)
MCHC: 33.8 g/dL (ref 30.0–36.0)
MCHC: 34.8 g/dL (ref 30.0–36.0)
MCV: 94.1 fL (ref 78.0–100.0)
MCV: 94.3 fL (ref 78.0–100.0)
Platelets: 259 10*3/uL (ref 150–400)
Platelets: 330 10*3/uL (ref 150–400)
RBC: 3.85 MIL/uL — ABNORMAL LOW (ref 3.87–5.11)
RBC: 4.36 MIL/uL (ref 3.87–5.11)
RDW: 12.5 % (ref 11.5–15.5)
RDW: 13.3 % (ref 11.5–15.5)
WBC: 11.9 10*3/uL — ABNORMAL HIGH (ref 4.0–10.5)
WBC: 17 10*3/uL — ABNORMAL HIGH (ref 4.0–10.5)

## 2010-05-31 LAB — BASIC METABOLIC PANEL
BUN: 10 mg/dL (ref 6–23)
BUN: 12 mg/dL (ref 6–23)
CO2: 26 mEq/L (ref 19–32)
CO2: 27 mEq/L (ref 19–32)
Calcium: 8.7 mg/dL (ref 8.4–10.5)
Calcium: 8.9 mg/dL (ref 8.4–10.5)
Chloride: 100 mEq/L (ref 96–112)
Chloride: 107 mEq/L (ref 96–112)
Creatinine, Ser: 0.65 mg/dL (ref 0.4–1.2)
Creatinine, Ser: 0.75 mg/dL (ref 0.4–1.2)
GFR calc Af Amer: >60 mL/min (ref >60)
GFR calc Af Amer: >60 mL/min (ref >60)
GFR calc non Af Amer: >60 mL/min (ref >60)
GFR calc non Af Amer: >60 mL/min (ref >60)
Glucose, Bld: 119 mg/dL — ABNORMAL HIGH (ref 70–99)
Glucose, Bld: 79 mg/dL (ref 70–99)
Potassium: 4.2 mEq/L (ref 3.5–5.1)
Potassium: 4.6 mEq/L (ref 3.5–5.1)
Sodium: 138 mEq/L (ref 135–145)
Sodium: 140 mEq/L (ref 135–145)

## 2010-05-31 LAB — ABO/RH: ABO/RH(D): A POS

## 2010-05-31 LAB — APTT: aPTT: 38 seconds — ABNORMAL HIGH (ref 24–37)

## 2010-05-31 LAB — PROTIME-INR
INR: 0.94 (ref 0.00–1.49)
Prothrombin Time: 12.5 seconds (ref 11.6–15.2)

## 2010-06-01 LAB — HEMOGLOBIN AND HEMATOCRIT, BLOOD
HCT: 39.7 % (ref 36.0–46.0)
HCT: 39.9 % (ref 36.0–46.0)
Hemoglobin: 13.3 g/dL (ref 12.0–15.0)
Hemoglobin: 13.5 g/dL (ref 12.0–15.0)

## 2010-07-04 ENCOUNTER — Other Ambulatory Visit: Payer: Self-pay

## 2010-07-11 NOTE — Discharge Summary (Signed)
[name], [name]                ACCOUNT NO.:  [id number]   MEDICAL RECORD NO.:  [id number]          PATIENT TYPE:  INP   LOCATION:  1418                         FACILITY:  [facility]   PHYSICIAN:  Sigmund I. Patsi Sears, M.D.DATE OF BIRTH:  [birth year]   DATE OF ADMISSION:  02/26/2008  DATE OF DISCHARGE:  03/03/2008                               DISCHARGE SUMMARY   FINAL DIAGNOSES:  1. Retained bilateral double-J stent.  2. Chronic constipation.  3. Chronic pelvic pain.  4. Depression.   OPERATION THIS ADMISSION:  Took place on March 01, 2008.  Operation is  cystourethroscopy at bedside and double-J stent removal.   DISCHARGE MEDICATIONS:  1. Valium 5 mg q.i.d.  2. Zoloft 50 mg per day x3 days; then Zoloft 100 mg p.o. per day.  3. Tramadol 50 mg t.i.d. p.r.n.  4. Trimethoprim 100 mg 1 p.o. per day.   HISTORY:  There is no copy of the patient's history is as follows:  Ms.  Kerry Powell is a 65 year old female with a history of dysfunctional  elimination syndrome and a history of untreated bipolar disease, status  post pelvic floor prolapse repair in the last month.  Postoperatively,  the patient had complained of flank pain, pelvic and pubic pain, and  chronic constipation.  The patient had urinary retention postoperatively  of 500 mL, was taught to self intermittent catheterize.  However, the  patient had bloody discharge on self-cath and was seen in the emergency  room.  She was felt to have possible infection.  Because of the  patient's chronic pain, exhaustion, depression, a Foley catheter was  placed.  She is admitted to the hospital for further evaluation.  She  had double-J stents placed at the time of surgery because of grade 4  pelvic floor prolapse, chronic dilation and stretching of the ureters  bilaterally.  Double-J stents were placed at time of surgery to protect  the ureters.   PAST MEDICAL HISTORY:  1. Significant for untreated bipolar/depression in the past.  2.  Colonic polyps post excision by Dr. Leone Payor on colonoscopy.  3. Arthritis.  4. Chronic reflux esophagitis.  5. Elevated cholesterol.  6. Hypertension.  7. History of peptic ulcer disease.  8. History of pyelonephritis.   PAST SURGERIES:  1. Decompression of median nerve and carpal tunnel.  2. History of hysterectomy.  3. History of tonsillectomy.   ADMISSION MEDICATIONS:  1. Hydrocodone.  2. Nexium 40 mg a day.  3. Nitrofurantoin 100 mg per day.   ALLERGIES:  NONE KNOWN.   FAMILY HISTORY:  Heart disease, benign familial hematuria, diabetes and  ovarian cancer.   SOCIAL HISTORY:  The patient drinks 6-8 cups of coffee per day and  drinks at least 1 soft drink per day.  She is currently married and is  employed by [employer], currently on medical leave.  She smokes 1-1/2 half  packs a day x 46 years.  She uses no alcohol, no other drugs.   REVIEW OF SYSTEMS:  CONSTITUTIONAL:  Significant for severe fatigue,  urinary frequency, urgency, nocturia, urinary intermittency, some  blurred vision, dizziness,  headache and anxiety.   PHYSICAL EXAMINATION ON ADMISSION:  VITAL SIGNS:  Blood pressure 135/76,  temperature 97.3.  Remaining physical examination is as noted in the H&P  of February 26, 2008.   HOSPITAL COURSE:  The patient was admitted to [facility] where  she remained afebrile during her hospital stay.  She was noted to be  severely constipated and received cathartics during her stay to help her  have a bowel movement.  The patient had severe pubic and pelvic pain,  and underwent CT scan which shows an incidental 1.6 cm exophytic mass in  the right lower pole of the kidney which enhances.  It is compatible  with a small renal cell carcinoma.  Bilateral double-J stents were in  place.  Remaining laboratories showed a normal white blood cell count.   The patient underwent a cystoscopy at the bedside and removal of double-  J stents.  She received cathartics.  A Foley  catheter was placed, but  after double-J stents were removed, the patient was placed on Valium 5  mg q.i.d., and responded to this drug by voiding well with zero postvoid  residual.  She has no more incontinence.  The patient did have a  coughing episode during her hospitalization and did not leak urine.  In  addition, note that the patient has a history of bipolar disorder,  untreated.  After pharmacy consultation, it was elected to begin her on  Zoloft 50 mg 1 per day for 3 days; then Zoloft 100 mg 1 p.o. per day.  She will take Tramadol if she needs it for pain, continue her Valium and  Zoloft, and take trimethoprim 100 mg 1e p.o. per day at home.  The  patient will help her bowel with dietary changes including better cereal  choices in the morning and give herself 1-2 weeks to see if her bowels  will improve at home.  If not, she will be referred for high colonic  enema to relieve her colon problems and will be referred to Dr. Lina Sar for further functional bowel evaluation.  The patient has been on  MiraLax since her surgery at home.  She is discharged in good condition,  voiding well, on the medications as noted above.   FOLLOW UP:  She will return to my office in 1-2 weeks for followup.   FINAL DIAGNOSES:  1. Urinary retention post pelvic floor repair.  2. Retained double-J stents - removed in the hospital.  3. History of untreated bipolar disorder with anxiety, now treated      with Valium and Zoloft.  4. Suspected urinary tract infection treated with Rocephin during the      hospitalization.  5. Incidental right lower pole renal cell carcinoma under observation.      Sigmund I. Patsi Sears, M.D.  Electronically Signed     SIT/MEDQ  D:  03/03/2008  T:  03/03/2008  Job:  [id number]

## 2010-07-11 NOTE — Assessment & Plan Note (Signed)
[facility]                         GASTROENTEROLOGY OFFICE NOTE   Kerry Powell, Kerry Powell                       MRN:          [id number]  DATE:04/04/2007                            DOB:          09-18-1945    REASON FOR CONSULTATION:  Severe stomach and back pain.   ASSESSMENT:  A 65 year old white woman who has had at least a several-  week, if not a couple-month history of epigastric pain radiating to the  intrascapular region.  Workup so far, including barium swallow and  abdominal ultrasound, have been unrevealing.  Laboratory workup from Dr.  Hal Hope has also been unrevealing, a CBC, CMET, amylase, lipase, H.  pylori antibodies negative.  The etiology is not clear.  Esophageal  spasm is possible, biliary dyskinesia is possible, undiagnosed gastric  ulcer, other functional GI disorder certainly seems possible.   She also has chronic intermittently urgent defecation with alternating  bowel habits, including fecal incontinence at times.  This really sounds  like irritable bowel syndrome.  She had an incomplete colonoscopy  because of the inability to withstand the exam just by standard sedation  in 2005.  We will request those records.   Note, she had a questionably trace Hemoccult positive stool on Dr.  Wendee Copp exam on March 28, 2007, but home Hemoccults were negative.  She has brown stool on physical exam today.   RECOMMENDATIONS/PLANS:  1. Upper GI endoscopy to investigate the upper GI symptoms first.      Risks, benefits and indications are explained.  She agrees and      understands.  2. She might need a complete colonoscopy at some point, but I want to      see the records of that to see exactly what was found and how      complete or incomplete the exam was.  She will probably need      propofol therapy.  3. Continue Nexium at this time as well as Carafate as well, as that      has mitigated but not totally relieved her symptoms.  4.  Also note that the patient takes four Advils a day, raising the      possibility of NSAID upper GI disease.   HISTORY:  This is a 65 year old white woman with problems as outlined  above.  These problems described started several weeks to months ago.  The lower GI complaints are chronic.  Full details of her medical  history are outlined on my medical office form.  She is describing a  fairly intense epigastric pain radiating up into the lower chest and  into the intrascapular area, frequently at night.  Now that she is on  Nexium and Carafate, it is a little bit better but is still severe.  Is  episodic.  It is not necessarily related to food.  She had the  ultrasound and barium swallow, as described.  She has waves of nausea  intermittently.  She has a globus sensation at times.  Bowel habits as  described above.  She has not had any rectal bleeding.   MEDICATIONS:  1.  Nexium 40 mg daily.  2. Carafate 4 times daily.  3. Cyclobenzaprine 10 mg at bedtime.  4. Advil 4 daily.   There are no known drug allergies.   PAST MEDICAL HISTORY:  1. Dyslipidemia.  2. Osteoarthritis.  3. Restless legs syndrome.  4. COPD.  5. Bipolar disorder, although she says she does not need medication      for this now.  6. Allergies/sinus problems.  7. Prior hysterectomy in 1977.  8. Appendectomy.  9. Mold and mildew allergies.   FAMILY HISTORY:  Ovarian cancer in her mother.  Diabetes in her sister  and father.  Coronary artery bypass surgery in father and mother.  Thyroid problems in her mother.  No colon cancer.   SOCIAL HISTORY:  She is married.  She works in Investment banker, operational at [employer].  [place].  She continues to smoke a pack per day and says she has cut  down.  She is counseled to quit and as to the reasons why.  There is no  alcohol or drug use.   REVIEW OF SYSTEMS:  Notable for some hoarseness and fatigue.  She says  the hoarseness has been on for these past few months with these other   symptoms.  See medical history report for full details.   PHYSICAL EXAMINATION:  Height 5 feet 6.  Weight 189 pounds.  Blood  pressure 122/72.  Pulse 82.  This is a well-nourished, middle-aged white woman in no acute distress.  HEENT:  Eyes are anicteric.  Normal mouth, lips, and pharynx.  NECK:  Supple.  No mass.  CHEST:  Clear.  HEART:  S1 and S2.  No murmurs or gallops.  ABDOMEN:  Slightly tender in the left lower quadrant.  RECTAL:  Normal tone, brown stool.  Soft.  No mass.  LYMPHATICS:  No neck or supraclavicular nodes.  EXTREMITIES:  No peripheral edema.  SKIN:  No rash.  PSYCH:  She is alert and oriented x3.  NEURO:  Cranial nerves II-XII are intact.   I have reviewed office notes, labs, and records from Dr. Collins Scotland.   Note that the patient has also has a history of an admission in  February, 2008 with transient neurologic deficit, confusion, and  dysarthria.  MRI was negative.  She also had chronic neck and back pain  listed there as well as gastroesophageal reflux disease.   I appreciate the opportunity to care for this patient.     Iva Boop, MD,FACG  Electronically Signed    CEG/MedQ  DD: 04/04/2007  DT: 04/04/2007  Job #: [id number]   cc:   Clydie Braun L. Hal Hope, M.D.

## 2010-07-11 NOTE — Op Note (Signed)
Kerry Powell, Kerry Powell                ACCOUNT NO.:  [id number]   MEDICAL RECORD NO.:  [id number]          PATIENT TYPE:  INP   LOCATION:  1418                         FACILITY:  [facility]   PHYSICIAN:  Sigmund I. Patsi Sears, M.D.DATE OF BIRTH:  Jun 26, 1945   DATE OF PROCEDURE:  03/01/2008  DATE OF DISCHARGE:                               OPERATIVE REPORT   SURGEON:  Sigmund I. Patsi Sears, M.D.   ASSISTANT:  Dr. Aldean Baker.   PROCEDURE:  1. Pan cystourethroscopy.  2. Removal of bilateral double-J soft flex ureteral stents.   PREOPERATIVE DIAGNOSES:  History of pelvic organ prolapse, status post  mesh repair with indwelling stents.   POSTOPERATIVE DIAGNOSES:  History of pelvic organ prolapse, status post  mesh repair with indwelling stents.   INDICATIONS:  This is a 65 year old female with a history of high-grade  pelvic organ prolapse.  Preoperative stents were placed.  While in the  hospital, we elected to remove the stents electively.  Risks and  benefits were discussed.   PROCEDURE IN DETAIL:  The patient was prepped and draped in the supine  position on her hospital bed.  Using a flexible cystoscope, her urethra  and bladder were examined.  Both appeared normal except to double-J  stents seen emanating from each ureteral orifice.  At this point both  stents were grasped and brought to the meatus and removed in their  entirety.  At this point the procedure was ended.  Please note Dr.  Patsi Sears was present throughout the entirety of the case.   ESTIMATED BLOOD LOSS:  Minimal.   URINE OUTPUT:  Unrecorded.   DRAINS:  None.   SPECIMENS:  Two stents were discarded.   DISPOSITION:  The patient will continue in the care of the hospitalist  as planned.      [name], MD      Sigmund I. Patsi Sears, M.D.  Electronically Signed    DW/MEDQ  D:  03/01/2008  T:  03/02/2008  Job:  [id number]

## 2010-07-11 NOTE — H&P (Signed)
Kerry Powell, Kerry Powell                ACCOUNT NO.:  [id number]   MEDICAL RECORD NO.:  [id number]          PATIENT TYPE:  OBV   LOCATION:  1436                         FACILITY:  [facility]   PHYSICIAN:  Excell Seltzer. Annabell Howells, M.D.    DATE OF BIRTH:  May 18, 1945   DATE OF ADMISSION:  02/15/2008  DATE OF DISCHARGE:                              HISTORY & PHYSICAL   Patient of Dr. Alexis Frock.   CHIEF COMPLAINT:  Right flank pain.   HISTORY:  Kerry Powell is a 65 year old white female who is status post a  vault suspension and bilateral ureteral stent placement on February 02, 2008.  She was seen in the office on February 10, 2008.  At that time  she had residual urine of 500 mL.  Culture was negative.  She had the  onset approximately 12 hours ago of severe right flank pain without  fever but with some nausea and decreased urine output.  She was seen in  the emergency room where CT scan revealed nonspecific changes without  significant hydronephrosis, stents were in good position.  She had  moderate bladder volume.  Her white count was 18,500.  A Foley was  placed with a 400+ mL residual.  Urine was positive but she has stents  and a recent vaginal surgery, and negative culture earlier this week.   PAST HISTORY:  Significant for no drug allergies.   MEDICATIONS:  Nexium, Flomax, tramadol.   MEDICAL HISTORY:  Pertinent for arthritis, reflux, hypercholesterolemia,  hypertension, peptic ulcer disease, pyelonephritis.   SURGICAL HISTORY:  Pertinent for decompression of carpal tunnel,  hysterectomy, and a tonsillectomy, as well as the recent surgery which  included cystoscopy, bilateral retrograde pyelograms, bilateral double-J  stent insertion, with a 6 x 24 cm double-J stent on the right and a 4.7  x 24 cm double-J stent on the left.  She had a Conservation officer, historic buildings and a Presenter, broadcasting  urethro-pubovaginal sling.   FAMILY HISTORY:  Significant for  heart disease and diabetes, and stones.   SOCIAL HISTORY:  She drinks significant coffee.  She is a pack-and-a-  half a day smoker.  She denies alcohol.   EXAM:  VITAL SIGNS:  Her most recent blood pressure 97/56, heart rate  69, respirations 16, temperature 97.4.  GENERAL:  She is a well-developed, well-nourished white female in no  acute distress, alert and orient x3.  HEAD/FACE:  Normocephalic, atraumatic.  LUNGS:  Clear with normal effort.  HEART:  Regular rate and rhythm.  ABDOMEN:  Soft, moderately obese with mild right CVA tenderness.  Right  lower quadrant tenderness.  No mass, hepatosplenomegaly, or hernias  noted.  EXTREMITIES:  Have full range of motion without edema.  NEUROLOGIC:  She is grossly intact.  SKIN:  Warm and dry.   ACCESSORY CLINICAL INFLAMMATION:  Noted above.  Her urinalysis has 21-50  white cells.  Too numerous to count red cells and few bacteria.  Nitrite  negative.   IMPRESSION:  Right flank pain which is most likely secondary to renal  back pressure  from urinary retention.  It predominates on the right  because the stent is larger on that side.  Although pyelonephritis is a  possibility, she does have a small right lower pole renal mass that is  being evaluated.   PLAN:  Will leave Foley catheter to straight drainage.  Admit her to 23-  hour evaluation for pain control.  Begin Cipro p.o. pending her culture.  She did received Cipro and Zosyn in the OR.  At this time it may be  worthwhile to consider early stent removal and Autocath instruction.      Excell Seltzer. Annabell Howells, M.D.  Electronically Signed     JJW/MEDQ  D:  02/15/2008  T:  02/15/2008  Job:  [id number]   cc:   Vonzell Schlatter. Patsi Sears, M.D.  Fax: [phone]

## 2010-07-11 NOTE — Op Note (Signed)
Kerry Powell, Kerry Powell                ACCOUNT NO.:  [id number]   MEDICAL RECORD NO.:  [id number]          PATIENT TYPE:  AMB   LOCATION:  DAY                          FACILITY:  [facility]   PHYSICIAN:  Sigmund I. 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   OPERATIONS:  1. Cystourethroscopy.  2. Bilateral retrograde pyelogram.  3. Bilateral double-J stents with 6-French x 24-cm double-J stent on      the right side and 4.7 x 24-cm double-J catheter on the left side;      Transport planner,      with Associate Professor TRAM urethral pubovaginal sling.   SURGEON:  Dr. Patsi Sears.   ANESTHESIA:  General endotracheal.   ESTIMATED BLOOD LOSS:  200 mL.   PREPARATION:  After appropriate preanesthesia, the patient was brought  to the operating room and placed on the operating room table in the  dorsal supine position where general endotracheal anesthesia was  induced.  She was then replaced in dorsal lithotomy position where the  pubis was shaved and prepped with Betadine solution and draped in usual  fashion.   REVIEW OF HISTORY:  Kerry Powell is a 65 year old smoker, with history of  the bipolar affected disorder, COPD, obesity, who was seen complaining  of stress urinary continence, with pelvic floor prolapse with cystocele  which she could feel at the vaginal ports and also a rectocele.  The  patient underwent urodynamics,which showed that the patient had a grade  3 cystocele, but leak-point pressure could not be established at the  time of urodynamics.  The patient is now for anterior vault  repair,  pubovaginal sling and possible rectocele repair.   PROCEDURE IN DETAIL:  With cystourethroscopy accomplished a bilateral  retrograde pyelogram was performed.  The ureters appeared normal in  caliber, with no intraureteral abnormality.  On the left side, however,  I could not pass a 6-French open-ended catheter pass the pelvic ureter.  A retrograde pyelogram showed a patent ureter and indigo carmine was  given and showed blue dye from each ureter.  A guidewire was placed with  some difficulty in the renal pelvis and a 6-French x 24-cm double-J  stent was placed in the right side, but a only 4.7 x 24-cm double-J  stent was placed on the left side.  This will need further investigation  with possible pelvic CT scan in the future.   Attention was then directed to the pelvic floor anatomy.  The patient  had a large midline cystocele, grade 3.  There was no enterocele noted,  but there was detachment of the vaginal apex.  There was a rectocele  which was felt to be a grade 2 only.  There was no enterocele found.  There was urethrocele noted.  It is elected to proceed with uphold vaginal apical reconstruction and a  Solex sling around the urethra, because of the patient's severe smoking,  and deep bronchitic cough.  It was noted that there would be an  additional 25% contraction within the vagina, which showed elevate the  rectocele even more, making it inconsequential.   Outline of the uphold transvaginal sling was then accomplished, from the  right side to around the urethral line, to the left side.  Using a 15-  blade, an incision was accomplished and subcutaneous tissue dissected  with sharp and blunt dissection.  The dissection was accomplished to the  level of the cervix to level of the surgical scar, and also laterally so  that I could feel the ischial spines.  Bleeding was controlled with  electrosurgical unit.  The patient was previously injected with 10 mL of  Xylocaine  with epinephrine 1:100,000.   Using the Capio device sutures were placed 1 fingerbreadth medial and  proximal to the spine, through the ligament.  Following this, the sling  was placed in excellent position and the periapical sling material was  fixed to the apex with 3-0 and also with 2-0 Vicryl interrupted sutures.  The wings of the sling were then brought into the vagina, and the  remaining portion of sling was tacked to the wall of the bladder with 3-  0 Vicryl suture.  Following this, the wound was irrigated with  antibiotic irrigation and closed with two separate 3-0 Vicryl sutures.  No bleeding was noted.  Attention was then directed to the Celexa sling,  4 mL of Xylocaine with epinephrine was injected into the midline  periurethral area, and using a 15 blade, and a 1.5-cm incision was made  in the mid urethra.  The subcutaneous tissue was dissected sharply and  bluntly.  The Solex transurethral sling was then placed in the obturator  internus on the right side and then on the left side.  Inspection  revealed that the sling had excellent contour, with no abnormalities.  The periurethral tissue was taken through the sling.  The sling was not  too tight.  The wound was closed with running 3-0 Vicryl suture.  Vaginal packing x2 was placed and the blue strings tied together.  Cystoscopy revealed that the double-J stents were in excellent position.  The patient was awakened and taken recovery room in good condition.  Estimated blood loss was 200 mL.      Sigmund I. Patsi Sears, M.D.  Electronically Signed     SIT/MEDQ  D:  02/02/2008  T:  02/02/2008  Job:  [id number]   cc:   Kalman Shan, MD  [address] [place] [address]  [name] [address]

## 2010-07-14 NOTE — H&P (Signed)
Kerry Powell, WOODROOF                ACCOUNT NO.:  [id number]   MEDICAL RECORD NO.:  [id number]          PATIENT TYPE:  EMS   LOCATION:  MAJO                         FACILITY:  [facility]   PHYSICIAN:  Deirdre Peer. Polite, M.D. DATE OF BIRTH:  03/07/1945   DATE OF ADMISSION:  04/08/2006  DATE OF DISCHARGE:                              HISTORY & PHYSICAL   CHIEF COMPLAINT:  Neuro deficit characterized by confusional state,  dysarthria x10 minutes.   HISTORY OF PRESENT ILLNESS:  A 65 year old female with known history of  asthma, depression, GERD, chronic neck and back pain who presents to the  office with the above chief complaint.  The patient states she had been  at work at [employer], and while there had a transit confusional state as  described above.  She was packaging products wrong.  She was trying to  talk with a customer and they said her speech was intelligible.  The  patient also complained of some perioral numbness.  She denied any limb  weakness or any visual changes.  Because of that, the patient presented  to her primary M.D. for further evaluation.  At the office, the patient  appears to be back to her baseline.  However, still states that her gait  is off.   PAST MEDICAL HISTORY:  As stated above.   MEDICATIONS:  None.  The patient has been noncompliant with all meds  times at least two years and in the past had been on Depakote, Premarin,  Singulair, Zyrtec, Flonase, Nexium, Ambien.   SOCIAL HISTORY:  Positive for tobacco, one pack per days.  No drugs.  No  alcohol.   PAST SURGICAL HISTORY:  To be determined.   ALLERGIES:  NONE.   FAMILY HISTORY:  Noncontributory.   PHYSICAL EXAMINATION:  GENERAL:  The patient is alert and oriented x3.  VITAL SIGNS:  BP 140/80, weight 180, pulse 72, temp 97.8.  HEENT:  Pupils equal, round, reactive to light.  Anicteric sclerae.  Tympanic membranes pearly.  Moist oral mucosa.  NECK:  Supple with no adenopathy.  No carotid bruits.  CHEST:  Clear to auscultation bilaterally.  CARDIOVASCULAR:  Regular.  No S3.  ABDOMEN:  Soft and nontender.  EXTREMITIES:  No clubbing, cyanosis, or edema.  Pulses 2+.  NEUROLOGIC:  Cranial nerves II-XII are intact.  Motor 5/5.  Deep tendon  reflexes depressed bilateral.  The patient has downgoing toes bilateral.  Tandem gait intact.  No sensory deficits.   ASSESSMENT:  1. Transient neurologic deficit characterized by perioral numbness and      dysarthria.  Rule out transient ischemic attack versus      cerebrovascular accident.  2. Depression.  3. Tobacco dependence.  4. Asthma.   Recommend the patient be admitted to a medicine floor bed for further  evaluation of probable TIA.  The patient will have a STAT CT of the  head, MRI/MRA, carotid Doppler studies.  I will also obtain a 2D echo.  We will obtain baseline lipid status, CBC, and a C-MET.  We will make  further recommendations after review of the  above studies.  Thank you in  advance.      Deirdre Peer. Polite, M.D.  Electronically Signed     RDP/MEDQ  D:  04/08/2006  T:  04/08/2006  Job:  [id number]

## 2010-07-14 NOTE — Discharge Summary (Signed)
Kerry Powell, Kerry Powell                ACCOUNT NO.:  [id number]   MEDICAL RECORD NO.:  [id number]          PATIENT TYPE:  INP   LOCATION:  5023                         FACILITY:  [facility]   PHYSICIAN:  Dionne Ano. Gramig, M.D.DATE OF BIRTH:  12-Jun-1945   DATE OF ADMISSION:  03/20/2005  DATE OF DISCHARGE:  03/23/2005                                 DISCHARGE SUMMARY   ADMISSION DIAGNOSES:  1.  Comminuted complex interarticular distal radius fracture right upper      extremity.  2.  Right middle finger mass.  3.  History of bipolar disorder.  4.  History of chronic obstructive pulmonary disease.  5.  History of gastroesophageal reflux disease.   SURGEON:  Dionne Ano. Amanda Pea, M.D.   CONSULTATIONS:  None.   PROCEDURE:  Open reduction, internal fixation about right distal radius.   HISTORY OF PRESENT ILLNESS:  Briefly, Kerry Powell is a pleasant 65 year old  female who unfortunately sustained a complex interarticular distal radius  fracture about the right wrist requiring surgical intervention.  She was  also noted to have a history of right middle finger mass that electively she  decided to have removed during the time of the open reduction, internal  fixation procedure.   HOSPITAL COURSE:  Kerry Powell was admitted on March 21, 2005 and underwent  an open reduction, internal fixation about the right wrist as well as a  right middle finger mass.  Please see the operative report for full details.  She tolerated the procedure very well and there were no complications.  On  postoperative day #1 the patient was noted to be in a significant amount of  pain.  Apparently she had not been offered any p.o. medications while  inpatient and only using her PCA.  She denied any numbness, tingling,  nausea, vomiting, fevers or chills.  Her vital signs were stable. She was  afebrile.  Oxygen saturation was 94% on 3L.  Evaluation of her right upper  extremity showed digits to have minimal edema.  Her  sensation was intact and  refill was intact.  She had no signs of infection.  She was slightly tender  with range of motion.  Her drain was removed without difficulty.  Chest was  clear to auscultation.  Heart with S1, S2.  Abdomen was nontender and bowel  sounds were positive.  Her situation was discussed at length with her  attending nurses and p.o. pain medications scheduled were ordered as well as  continuation of her PCA.  Ativan was ordered for anxiety related issues.  She would continue standard intravenous antibiotics, elevation and edema  control.  On postoperative day #2 she was alert and oriented and her vital  signs were stable.  She was afebrile and neurovascularly intact to the right  upper extremity.  She was tolerating her diet without difficulties.  Close  observation and antibiotics were continued and pain control and on the  following day, postoperative day #3 she was doing very well overall.  She  had stable vital signs and was afebrile.  Neurovascularly she was intact and  had good  range of motion of the wrist and had no signs of compartment  syndrome and therefore decision was made to discharge her home.   FINAL DIAGNOSES:  1.  Status post open reduction, internal fixation right distal radius.  2.  Right middle finger mass removal.  3.  History of bipolar disorder.  4.  History of chronic obstructive pulmonary disease.   CONDITION ON DISCHARGE:  Improved.   DIET:  Regular.   ACTIVITY:  She is to keep her dressings clean, dry and intact.  She may move  her fingers frequently within the confines of the splint.  She will elevate  frequently.   DISCHARGE MEDICATIONS:  Discharge medications included:  1.  Percocet.  2.  Robaxin.  3.  Peri-Colace.   FOLLOW UP:  Follow up will be in 10 days with Dr. Amanda Pea.  She will call us  to schedule this appointment.      Karie Chimera, P.A.-C.    ______________________________  Dionne Ano. Amanda Pea, M.D.    BB/MEDQ   D:  05/01/2005  T:  05/02/2005  Job:  [id number]

## 2010-07-14 NOTE — Discharge Summary (Signed)
Kerry Powell, Kerry Powell                ACCOUNT NO.:  [id number]   MEDICAL RECORD NO.:  [id number]          PATIENT TYPE:  INP   LOCATION:  5524                         FACILITY:  [facility]   PHYSICIAN:  Deirdre Peer. Polite, M.D. DATE OF BIRTH:  [birth year]   DATE OF ADMISSION:  04/08/2006  DATE OF DISCHARGE:  04/09/2006                               DISCHARGE SUMMARY   DISCHARGE DIAGNOSES:  1. Transient neurologic deficit, characterized by confusion and      dysarthria.  MRI negative for cerebrovascular accident, therefore      consistent with transient ischemic attack.  2. History of depression.  3. Chronic sinusitis.  4. Urinary tract infection.  5. Tobacco dependence.  6. Chronic neck and back pain.  7. History of asthma.  8. Gastroesophageal reflux disease.   DISCHARGE MEDICATIONS:  1. Levaquin 500 mg daily times five days.  2. Zyrtec 10 mg daily.  3. Chantix 0.5 mg daily times three, then 0.5 mg b.i.d. times four,      then 1 mg b.i.d.  4. The patient may resume Nexium and Singulair.  5. Aspirin 81 mg daily, enteric-coated.   The patient is asked to stop smoking.   DISPOSITION:  The patient will be discharged to home in stable  condition.  Again, asked to stop smoking immediately.  The patient will  require an outpatient angiogram to evaluate abnormal MRA studies.  Patient had a CT of the head which showed no acute intracranial  findings, ethmoid sinus mucosal thickening, no significant abnormalities  of the brain, chronic sinusitis.  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  There is no large vessel occlusion.  Carotid ultrasound:  Approximately  40 to 60% stenosis on the left.  Please note that on the imaging, x-ray  shows MRA of the head stenosis of the right A1 and mild left MCA  disease.  Actual dictated report states moderate stenosis in the  proximal right A1 segment.  There is mild irregularity in the  right  middle cerebral artery, negative for large vessel occlusion is seen.  Negative for aneurysm, both vertebral arteries, vascular artery and  posterior cerebral arteries are patent without significant stenosis.  CBC:  White count 12, hemoglobin 14, platelets 310,000.  BMET within  normal limits.  U/A cloudy, nitrite negative, many leukocytes, many  squames, few bacteria.  Urine culture pending.  TSH 2.1.   HISTORY OF PRESENT ILLNESS:  A 65 year old female who presented to the  office after having a neuro deficit at work, characterized by confusion  and dysarthria.  Upon evaluation in the office, we recommended to have  the patient admitted to the hospital for further evaluation.  Please see  dictated H&P for further details on past medical history, meds, social  history, surgical history, allergies and family history.   HOSPITAL COURSE:  Problem #1 - The patient is admitted to a medicine  floor and prepared for evaluation and treatment of neuro deficit.  The  patient underwent CT, MRI, MRA, carotid Doppler as well as 2D  echo.  The  patient's 2D echo is pending at the time of this dictation, however CT,  MRI, MRA as stated above.  MRA did show abnormalities in the A1 segment.  This was reviewed with the neuro-radiologist, Dr. Benard Rink, who  recommended formal angiogram.  At this time, do not recommend the  patient continue in the hospital at this time.  This can be pursued on  an outpatient basis.  Currently the patient is asymptomatic and has been  stable without any recurrent neurologic deficits.  Problem #2 - Probable UTI.  Problem #3 - Chronic sinusitis:  Recommended treatment with Levaquin,  which will cover urine as well as sinus disease.  Problem #4 - Tobacco dependence:  Recommended she stop now.  The patient  is very interested in stopping.  Has been given prescription for  Chantix.   At this time, the patient is hemodynamically stable and will be  discharged to home.   Asked to follow-up with primary MD in approximately  one week.  Please note the patient did have an abnormal MRA and is  recommended to have a formal angiogram.  Our office will schedule this.  The patient has been advised as well of the need to have this follow-up  test and has been strongly encouraged to stop smoking.      Deirdre Peer. Polite, M.D.  Electronically Signed     RDP/MEDQ  D:  04/09/2006  T:  04/10/2006  Job:  [id number]

## 2010-07-14 NOTE — Op Note (Signed)
Kerry Powell, Kerry Powell                ACCOUNT NO.:  [id number]   MEDICAL RECORD NO.:  [id number]          PATIENT TYPE:  OIB   LOCATION:  5501                         FACILITY:  [facility]   PHYSICIAN:  Dionne Ano. Gramig III, M.D.DATE OF BIRTH:  09-14-1945   DATE OF PROCEDURE:  03/20/2005  DATE OF DISCHARGE:                                 OPERATIVE REPORT   PREOPERATIVE DIAGNOSES:  1.  Comminuted complex intra-articular distal radius fracture, right upper      extremity.  2.  Middle finger mass, right hand.  3.  Increased swelling about the volar compartment, right forearm.   POSTOPERATIVE DIAGNOSES:  1.  Comminuted complex intra-articular distal radius fracture, right upper      extremity.  2.  Middle finger mass, right hand.  3.  Increased swelling about the volar compartment, right forearm.   PROCEDURE:  1.  Open reduction and internal fixation, comminuted complex wrist fracture      with DVR volar plate; this was a fracture of greater than 3 parts, intra-      articular in nature, right upper extremity.  2.  Fasciotomy, volar forearm, right upper extremity.  3.  Stress radiography.  4.  Removal of right middle finger mass.   SURGEON:  Dionne Ano. Amanda Pea, M.D.   ASSISTANT:  None.   COMPLICATIONS:  None.   ANESTHESIA:  General.   TOURNIQUET TIME:  Less than an hour.   DRAINS:  One.   INDICATION FOR THE PROCEDURE:  Ms. Kerry Powell presents with the above-  mentioned diagnoses.  I have counseled her in regards to risks and benefits  of surgery including risks of infection, bleeding, anesthesia, damage to  normal structures and failure of surgery to accomplish its intended goals  and relieving symptoms and restoring function.  She has had progressive  swelling, significant pain, deformity and an unstable fracture pattern.  I  have counseled her in regards to risks and benefits of surgery and she  desires to proceed.   OPERATIVE PROCEDURE IN DETAIL:  The patient was seen by  myself and  Anesthesia, taken to the operating suite, underwent preoperative Ancef  administration, was laid supine, given a general anesthetic, and was then  prepped and draped in the usual sterile fashion with Betadine scrub and  paint.  Following this, the patient then underwent elevation of the  tourniquet and under a sterile field, a volar radial approach to the radius  was made.  I performed a deep and superficial volar forearm fasciotomy with  a slightly extended incision.  The patient tolerated this well.  Compartments were released nicely.  There was no nonviable muscle and I was  pleased with this.  Once this was done, I then retracted the FCR tendon  radially and retracted the carpal canal contents ulnar.  The pronator  quadratus was then lifted off of the fracture site via an L-shaped incision.  Periosteal elevation was accomplished for later closure and following this,  ORIF of the fracture was accomplished.  The fracture was reduced,  provisionally held and following this, application of a DVR  plate and screws  was accomplished according to standard AO technique.  The patient tolerated  this well without difficulty.  Once this was done, I performed a stress  radiography, revealing excellent stability.  I was pleased with this and the  findings.  She had excellent stability, good restoration of radial height,  inclination and tilt.  I then closed the pronator quadratus, deflated the  tourniquet, obtained hemostasis, closed the subcu with Vicryl, placed a #7  TLS drain and closed the skin with staple clips.  Following this, I then  inflated the tourniquet and made a mid-lateral incision around the radial  aspect of the middle finger.  I dissected underneath the extensor apparatus,  where a large ganglion approximately 1 x 1.5 cm was located.  It was  circumferentially dissected and removed from the PIP joint, where its origin  occurred.  It was sent for specimen.  This was a  deep mass removal about the  finger.  Following this, I deflated the tourniquet once again and obtained  hemostasis, irrigated and closed the wound with Prolene.  She tolerated this  well.   The patient will be admitted for observation, IV antibiotics, pain  management and general postoperative needs and cares.  I discussed with the  patient these notes, etc, and all questions have been encouraged and  answered.  It was a pleasure to see her today.           ______________________________  Dionne Ano. Everlene Other, M.D.     Nash Mantis  D:  03/21/2005  T:  03/21/2005  Job:  [id number]

## 2010-07-14 NOTE — Assessment & Plan Note (Signed)
[facility]                         GASTROENTEROLOGY OFFICE NOTE   Kerry Powell, Kerry Powell                       MRN:          [id number]  DATE:05/13/2007                            DOB:          04-17-1945    CHIEF COMPLAINT:  Followup after esophagogastroduodenoscopy .   She had multiple NSAID ulcers at her esophagogastroduodenoscopy. This  was on April 07, 2007. She is now on Nexium 40 mg b.i.d. She feels  much better. She is still having interscapular pain and probably has  spine problems causing that. A little bit of constipation which she  notices after starting the Nexium. She has also in her review of systems  had a viral syndrome and some low back pain after coughing. Tylenol has  not helped her arthritis pain. She is now on a steroid pack per Dr.  Ethelene Hal regarding the back pain. She is essentially full of arthritis  she says. She cannot sleep because of the pain at times. Dr. Hal Hope had  given her Ultram to use. However, Dr. Hal Hope does not want her to  overuse that or take that for more than a month it sounds like.   PAST MEDICAL HISTORY:  Otherwise reviewed and unchanged from my note of  April 04, 2007.   Weight 185 pounds. Pulse 100. Blood pressure 124/80 on the right.   ASSESSMENT:  1. 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  2. History of incomplete colonoscopy, has not really had screening.  3. Degenerative arthritis causing quite a bit of problems.   PLAN:  1. Given the mild constipation we are going to change from Nexium to      Pantoprazole 40 mg daily.  2. I am starting her on Celebrex 200 mg daily. This is a selective COX-      inhibitor, and hopefully will be less toxic to her gastrointestinal      tract. I do think she can take NSAIDs, provided she stay on chronic      proton pump inhibitor therapy. That is not a guarantee she will not      have ulcers, but lessens the risk.  Given her overall situation, it      sounds like NSAIDs may be necessary for this lady. Hopefully the      Celebrex will work and be cost effective. However, she could take a      nonselective NSAID, but again must stay on proton pump inhibitor      therapy.  3. We will schedule a screening colonoscopy under Propofol as she      could not tolerate standard sedation when she attempted this in      [place] in 2005.  4. Otherwise, I plan to see her back as needed. She will follow with      Dr. Ethelene Hal and Dr. Hal Hope.     Iva Boop, MD,FACG  Electronically Signed    CEG/MedQ  DD: 05/14/2007  DT: 05/14/2007  Job #: [id number]   cc:   Clydie Braun L. Hal Hope, M.D.  Richard D. Ramos,  M.D. 

## 2010-07-25 ENCOUNTER — Encounter: Payer: Self-pay | Admitting: Radiology

## 2010-10-21 ENCOUNTER — Emergency Department (HOSPITAL_COMMUNITY)
Admission: EM | Admit: 2010-10-21 | Discharge: 2010-10-22 | Disposition: A | Discharge status: Home / Self Care | Payer: Medicare Other | Attending: Emergency Medicine | Admitting: Emergency Medicine

## 2010-10-21 ENCOUNTER — Emergency Department (HOSPITAL_COMMUNITY): Payer: Medicare Other

## 2010-10-21 DIAGNOSIS — S92909A Unspecified fracture of unspecified foot, initial encounter for closed fracture: Secondary | ICD-10-CM | POA: Insufficient documentation

## 2010-10-21 DIAGNOSIS — Z7901 Long term (current) use of anticoagulants: Secondary | ICD-10-CM | POA: Insufficient documentation

## 2010-10-21 DIAGNOSIS — S82899A Other fracture of unspecified lower leg, initial encounter for closed fracture: Secondary | ICD-10-CM | POA: Insufficient documentation

## 2010-10-21 DIAGNOSIS — Z8673 Personal history of transient ischemic attack (TIA), and cerebral infarction without residual deficits: Secondary | ICD-10-CM | POA: Insufficient documentation

## 2010-10-21 DIAGNOSIS — Y92009 Unspecified place in unspecified non-institutional (private) residence as the place of occurrence of the external cause: Secondary | ICD-10-CM | POA: Insufficient documentation

## 2010-10-21 DIAGNOSIS — W108XXA Fall (on) (from) other stairs and steps, initial encounter: Secondary | ICD-10-CM | POA: Insufficient documentation

## 2010-10-21 DIAGNOSIS — I10 Essential (primary) hypertension: Secondary | ICD-10-CM | POA: Insufficient documentation

## 2010-10-31 ENCOUNTER — Emergency Department (HOSPITAL_COMMUNITY)
Admission: EM | Admit: 2010-10-31 | Discharge: 2010-10-31 | Disposition: A | Discharge status: Home / Self Care | Payer: Medicare Other | Attending: Emergency Medicine | Admitting: Emergency Medicine

## 2010-10-31 DIAGNOSIS — Z8673 Personal history of transient ischemic attack (TIA), and cerebral infarction without residual deficits: Secondary | ICD-10-CM | POA: Insufficient documentation

## 2010-10-31 DIAGNOSIS — I10 Essential (primary) hypertension: Secondary | ICD-10-CM | POA: Insufficient documentation

## 2010-10-31 DIAGNOSIS — L0390 Cellulitis, unspecified: Secondary | ICD-10-CM | POA: Insufficient documentation

## 2010-10-31 DIAGNOSIS — L0291 Cutaneous abscess, unspecified: Secondary | ICD-10-CM | POA: Insufficient documentation

## 2010-10-31 DIAGNOSIS — M79609 Pain in unspecified limb: Secondary | ICD-10-CM | POA: Insufficient documentation

## 2010-10-31 LAB — GLUCOSE, CAPILLARY: Glucose-Capillary: 115 mg/dL — ABNORMAL HIGH (ref 70–99)

## 2010-11-03 ENCOUNTER — Emergency Department (HOSPITAL_COMMUNITY): Payer: Medicare Other

## 2010-11-03 ENCOUNTER — Emergency Department (HOSPITAL_COMMUNITY)
Admission: EM | Admit: 2010-11-03 | Discharge: 2010-11-03 | Disposition: A | Discharge status: Home / Self Care | Payer: Medicare Other | Attending: Emergency Medicine | Admitting: Emergency Medicine

## 2010-11-03 DIAGNOSIS — Z79899 Other long term (current) drug therapy: Secondary | ICD-10-CM | POA: Insufficient documentation

## 2010-11-03 DIAGNOSIS — E7800 Pure hypercholesterolemia, unspecified: Secondary | ICD-10-CM | POA: Insufficient documentation

## 2010-11-03 DIAGNOSIS — R110 Nausea: Secondary | ICD-10-CM | POA: Insufficient documentation

## 2010-11-03 DIAGNOSIS — E876 Hypokalemia: Secondary | ICD-10-CM | POA: Insufficient documentation

## 2010-11-03 DIAGNOSIS — F411 Generalized anxiety disorder: Secondary | ICD-10-CM | POA: Insufficient documentation

## 2010-11-03 DIAGNOSIS — I10 Essential (primary) hypertension: Secondary | ICD-10-CM | POA: Insufficient documentation

## 2010-11-03 DIAGNOSIS — M79609 Pain in unspecified limb: Secondary | ICD-10-CM | POA: Insufficient documentation

## 2010-11-03 DIAGNOSIS — T3695XA Adverse effect of unspecified systemic antibiotic, initial encounter: Secondary | ICD-10-CM | POA: Insufficient documentation

## 2010-11-03 DIAGNOSIS — Z8673 Personal history of transient ischemic attack (TIA), and cerebral infarction without residual deficits: Secondary | ICD-10-CM | POA: Insufficient documentation

## 2010-11-03 DIAGNOSIS — F319 Bipolar disorder, unspecified: Secondary | ICD-10-CM | POA: Insufficient documentation

## 2010-11-03 LAB — DIFFERENTIAL
Basophils Absolute: 0 10*3/uL (ref 0.0–0.1)
Basophils Relative: 0 % (ref 0–1)
Eosinophils Absolute: 0.1 10*3/uL (ref 0.0–0.7)
Eosinophils Relative: 0 % (ref 0–5)
Lymphocytes Relative: 25 % (ref 12–46)
Lymphs Abs: 2.9 10*3/uL (ref 0.7–4.0)
Monocytes Absolute: 1.1 10*3/uL — ABNORMAL HIGH (ref 0.1–1.0)
Monocytes Relative: 10 % (ref 3–12)
Neutro Abs: 7.3 10*3/uL (ref 1.7–7.7)
Neutrophils Relative %: 65 % (ref 43–77)

## 2010-11-03 LAB — POCT I-STAT, CHEM 8
BUN: 19 mg/dL (ref 6–23)
Calcium, Ion: 1.13 mmol/L (ref 1.12–1.32)
Chloride: 102 mEq/L (ref 96–112)
Creatinine, Ser: 1.5 mg/dL — ABNORMAL HIGH (ref 0.50–1.10)
Glucose, Bld: 109 mg/dL — ABNORMAL HIGH (ref 70–99)
HCT: 46 % (ref 36.0–46.0)
Hemoglobin: 15.6 g/dL — ABNORMAL HIGH (ref 12.0–15.0)
Potassium: 3 mEq/L — ABNORMAL LOW (ref 3.5–5.1)
Sodium: 139 mEq/L (ref 135–145)
TCO2: 23 mmol/L (ref 0–100)

## 2010-11-03 LAB — POCT I-STAT TROPONIN I: Troponin i, poc: 0 ng/mL (ref 0.00–0.08)

## 2010-11-03 LAB — CBC
HCT: 41.2 % (ref 36.0–46.0)
Hemoglobin: 14.5 g/dL (ref 12.0–15.0)
MCH: 31.3 pg (ref 26.0–34.0)
MCHC: 35.2 g/dL (ref 30.0–36.0)
MCV: 89 fL (ref 78.0–100.0)
Platelets: 404 10*3/uL — ABNORMAL HIGH (ref 150–400)
RBC: 4.63 MIL/uL (ref 3.87–5.11)
RDW: 13.6 % (ref 11.5–15.5)
WBC: 11.3 10*3/uL — ABNORMAL HIGH (ref 4.0–10.5)

## 2010-11-03 MED ORDER — IOHEXOL 300 MG/ML  SOLN
100.0000 mL | Freq: Once | INTRAMUSCULAR | Status: AC | PRN
  Administered 2010-11-03: 100 mL via INTRAVENOUS

## 2010-11-28 LAB — BLOOD GAS, ARTERIAL
Acid-Base Excess: 1.5
Bicarbonate: 25.4 — ABNORMAL HIGH
Drawn by: 101791
FIO2: 21
O2 Saturation: 93.6
Patient temperature: 98.6
TCO2: 22.3
pCO2 arterial: 39.9
pH, Arterial: 7.421 — ABNORMAL HIGH
pO2, Arterial: 65.1 — ABNORMAL LOW

## 2010-12-01 LAB — CBC
HCT: 38.9 % (ref 36.0–46.0)
HCT: 40.3 % (ref 36.0–46.0)
HCT: 40.5 % (ref 36.0–46.0)
HCT: 40.8 % (ref 36.0–46.0)
Hemoglobin: 12.9 g/dL (ref 12.0–15.0)
Hemoglobin: 13.4 g/dL (ref 12.0–15.0)
Hemoglobin: 13.5 g/dL (ref 12.0–15.0)
Hemoglobin: 13.6 g/dL (ref 12.0–15.0)
MCHC: 33 g/dL (ref 30.0–36.0)
MCHC: 33.1 g/dL (ref 30.0–36.0)
MCHC: 33.3 g/dL (ref 30.0–36.0)
MCHC: 33.4 g/dL (ref 30.0–36.0)
MCV: 93.4 fL (ref 78.0–100.0)
MCV: 93.5 fL (ref 78.0–100.0)
MCV: 94.3 fL (ref 78.0–100.0)
MCV: 95.5 fL (ref 78.0–100.0)
Platelets: 316 10*3/uL (ref 150–400)
Platelets: 317 10*3/uL (ref 150–400)
Platelets: 320 10*3/uL (ref 150–400)
Platelets: 347 10*3/uL (ref 150–400)
RBC: 4.08 MIL/uL (ref 3.87–5.11)
RBC: 4.3 MIL/uL (ref 3.87–5.11)
RBC: 4.32 MIL/uL (ref 3.87–5.11)
RBC: 4.36 MIL/uL (ref 3.87–5.11)
RDW: 13.2 % (ref 11.5–15.5)
RDW: 13.3 % (ref 11.5–15.5)
RDW: 13.3 % (ref 11.5–15.5)
RDW: 13.3 % (ref 11.5–15.5)
WBC: 10.3 10*3/uL (ref 4.0–10.5)
WBC: 11.7 10*3/uL — ABNORMAL HIGH (ref 4.0–10.5)
WBC: 16.8 10*3/uL — ABNORMAL HIGH (ref 4.0–10.5)
WBC: 18.5 10*3/uL — ABNORMAL HIGH (ref 4.0–10.5)

## 2010-12-01 LAB — URINALYSIS, ROUTINE W REFLEX MICROSCOPIC
Bilirubin Urine: NEGATIVE
Bilirubin Urine: NEGATIVE
Glucose, UA: NEGATIVE mg/dL
Glucose, UA: NEGATIVE mg/dL
Ketones, ur: NEGATIVE mg/dL
Nitrite: NEGATIVE
Nitrite: NEGATIVE
Protein, ur: >300 mg/dL — AB
Protein, ur: >300 mg/dL — AB
Specific Gravity, Urine: 1.017 (ref 1.005–1.030)
Specific Gravity, Urine: 1.019 (ref 1.005–1.030)
Urobilinogen, UA: 0.2 mg/dL (ref 0.0–1.0)
Urobilinogen, UA: 1 mg/dL (ref 0.0–1.0)
pH: 6 (ref 5.0–8.0)
pH: 6.5 (ref 5.0–8.0)

## 2010-12-01 LAB — COMPREHENSIVE METABOLIC PANEL
ALT: 14 U/L (ref 0–35)
AST: 14 U/L (ref 0–37)
Albumin: 3.4 g/dL — ABNORMAL LOW (ref 3.5–5.2)
Alkaline Phosphatase: 96 U/L (ref 39–117)
BUN: 11 mg/dL (ref 6–23)
CO2: 28 mEq/L (ref 19–32)
Calcium: 9 mg/dL (ref 8.4–10.5)
Chloride: 103 mEq/L (ref 96–112)
Creatinine, Ser: 0.71 mg/dL (ref 0.4–1.2)
GFR calc Af Amer: >60 mL/min (ref >60)
GFR calc non Af Amer: >60 mL/min (ref >60)
Glucose, Bld: 99 mg/dL (ref 70–99)
Potassium: 4.3 mEq/L (ref 3.5–5.1)
Sodium: 138 mEq/L (ref 135–145)
Total Bilirubin: 0.7 mg/dL (ref 0.3–1.2)
Total Protein: 6.6 g/dL (ref 6.0–8.3)

## 2010-12-01 LAB — URINE CULTURE
Colony Count: NO GROWTH
Colony Count: NO GROWTH
Culture: NO GROWTH
Culture: NO GROWTH

## 2010-12-01 LAB — DIFFERENTIAL
Basophils Absolute: 0 10*3/uL (ref 0.0–0.1)
Basophils Absolute: 0.2 10*3/uL — ABNORMAL HIGH (ref 0.0–0.1)
Basophils Relative: 0 % (ref 0–1)
Basophils Relative: 1 % (ref 0–1)
Eosinophils Absolute: 0.2 10*3/uL (ref 0.0–0.7)
Eosinophils Absolute: 0.4 10*3/uL (ref 0.0–0.7)
Eosinophils Relative: 1 % (ref 0–5)
Eosinophils Relative: 4 % (ref 0–5)
Lymphocytes Relative: 16 % (ref 12–46)
Lymphocytes Relative: 32 % (ref 12–46)
Lymphs Abs: 2.9 10*3/uL (ref 0.7–4.0)
Lymphs Abs: 3.7 10*3/uL (ref 0.7–4.0)
Monocytes Absolute: 0.7 10*3/uL (ref 0.1–1.0)
Monocytes Absolute: 1 10*3/uL (ref 0.1–1.0)
Monocytes Relative: 4 % (ref 3–12)
Monocytes Relative: 8 % (ref 3–12)
Neutro Abs: 14.5 10*3/uL — ABNORMAL HIGH (ref 1.7–7.7)
Neutro Abs: 6.5 10*3/uL (ref 1.7–7.7)
Neutrophils Relative %: 55 % (ref 43–77)
Neutrophils Relative %: 79 % — ABNORMAL HIGH (ref 43–77)

## 2010-12-01 LAB — BASIC METABOLIC PANEL
BUN: 8 mg/dL (ref 6–23)
CO2: 29 mEq/L (ref 19–32)
Calcium: 8.5 mg/dL (ref 8.4–10.5)
Chloride: 106 mEq/L (ref 96–112)
Creatinine, Ser: 0.86 mg/dL (ref 0.4–1.2)
GFR calc Af Amer: >60 mL/min (ref >60)
GFR calc non Af Amer: >60 mL/min (ref >60)
Glucose, Bld: 129 mg/dL — ABNORMAL HIGH (ref 70–99)
Potassium: 4.3 mEq/L (ref 3.5–5.1)
Sodium: 139 mEq/L (ref 135–145)

## 2010-12-01 LAB — URINE MICROSCOPIC-ADD ON

## 2010-12-01 LAB — CREATININE, SERUM
Creatinine, Ser: 0.67 mg/dL (ref 0.4–1.2)
GFR calc Af Amer: >60 mL/min (ref >60)
GFR calc non Af Amer: >60 mL/min (ref >60)

## 2010-12-01 LAB — HEMOGLOBIN AND HEMATOCRIT, BLOOD
HCT: 42.9 % (ref 36.0–46.0)
Hemoglobin: 14.4 g/dL (ref 12.0–15.0)

## 2011-02-18 ENCOUNTER — Emergency Department (INDEPENDENT_AMBULATORY_CARE_PROVIDER_SITE_OTHER)
Admission: EM | Admit: 2011-02-18 | Discharge: 2011-02-18 | Disposition: A | Discharge status: Home / Self Care | Payer: Medicare Other | Source: Home / Self Care | Attending: Emergency Medicine | Admitting: Emergency Medicine

## 2011-02-18 ENCOUNTER — Encounter: Payer: Self-pay | Admitting: *Deleted

## 2011-02-18 DIAGNOSIS — K047 Periapical abscess without sinus: Secondary | ICD-10-CM

## 2011-02-18 HISTORY — DX: Cerebral infarction, unspecified: I63.9

## 2011-02-18 HISTORY — DX: Hyperlipidemia, unspecified: E78.5

## 2011-02-18 HISTORY — DX: Transient cerebral ischemic attack, unspecified: G45.9

## 2011-02-18 HISTORY — DX: Essential (primary) hypertension: I10

## 2011-02-18 HISTORY — DX: Gastro-esophageal reflux disease without esophagitis: K21.9

## 2011-02-18 LAB — POCT RAPID STREP A: Streptococcus, Group A Screen (Direct): NEGATIVE

## 2011-02-18 MED ORDER — CLINDAMYCIN HCL 300 MG PO CAPS: 300.0000 mg | ORAL_CAPSULE | Freq: Four times a day (QID) | ORAL | Status: AC

## 2011-02-18 MED ORDER — TRAMADOL HCL 50 MG PO TABS: 100.0000 mg | ORAL_TABLET | Freq: Three times a day (TID) | ORAL | Status: AC | PRN

## 2011-02-18 NOTE — ED Notes (Addendum)
C/O "knot" that started underneath left jaw bone Thurs; swelling and discomfort getting significantly worse.  This AM woke up w/ generalized body aches and feeling feverish.   Has been applying warm compresses and took some Advil.  Also took Catering manager for sneezing.  C/O difficulty swallowing due to swelling, but states that's sometimes normal for her w/ CVA hx.

## 2011-02-18 NOTE — ED Provider Notes (Signed)
History     CSN: [id number]  Arrival date & time 02/18/11  1024   First MD Initiated Contact with Patient 02/18/11 1116      No chief complaint on file.   (Consider location/radiation/quality/duration/timing/severity/associated sxs/prior treatment) HPI Comments: Kerry Powell is a 65 year old female who has had a four-day history of pain and swelling just below the jaw on the left side. It is sore to touch. It feels hot. In addition that she felt feverish and chilled and achy. She denies any headache, nasal congestion, sore throat, oral lesions. She denies any toothache. She has had some hoarseness, postnasal drip, and some sneezing. She denies any coughing, wheezing, or shortness of breath.   Past Medical History  Diagnosis Date  . Stroke     x2 2011  . TIA (transient ischemic attack)   . Hypertension   . Hyperlipidemia   . GERD (gastroesophageal reflux disease)     Past Surgical History  Procedure Date  . Abdominal hysterectomy   . Kidney surgery     benign tumor removal  . Rectocele repair   . Cystocele repair   . Cataract extraction   . Orthopedic surgery     Wrist & elbow    History reviewed. No pertinent family history.  History  Substance Use Topics  . Smoking status: Former Games developer  . Smokeless tobacco: Not on file  . Alcohol Use: No    OB History    Grav Para Term Preterm Abortions TAB SAB Ect Mult Living                  Review of Systems  Constitutional: Positive for chills. Negative for fever.  HENT: Positive for facial swelling, sneezing, neck pain, dental problem and postnasal drip. Negative for ear pain, congestion, sore throat, rhinorrhea, trouble swallowing, neck stiffness and voice change.   Respiratory: Negative for chest tightness and shortness of breath.     Allergies  Sulfa antibiotics  Home Medications   Current Outpatient Rx  Name Route Sig Dispense Refill  . CLOPIDOGREL BISULFATE 75 MG PO TABS Oral Take 75 mg by mouth daily.       [name] ESCITALOPRAM OXALATE 5 MG PO TABS Oral Take 5 mg by mouth as needed.      [name] PANTOPRAZOLE SODIUM PO Oral Take by mouth daily.      [name] RAMELTEON 8 MG PO TABS Oral Take 8 mg by mouth at bedtime.      [name] ROSUVASTATIN CALCIUM 5 MG PO TABS Oral Take 5 mg by mouth daily.      . TRIAMTERENE-HCTZ 37.5-25 MG PO CAPS Oral Take 1 capsule by mouth every morning.      [name] CLINDAMYCIN HCL 300 MG PO CAPS Oral Take 1 capsule (300 mg total) by mouth 4 (four) times daily. 40 capsule 0  . TRAMADOL HCL 50 MG PO TABS Oral Take 2 tablets (100 mg total) by mouth every 8 (eight) hours as needed for pain. Maximum dose= 8 tablets per day 30 tablet 0    BP 143/77  Pulse 103  Temp(Src) 98.4 F (36.9 C) (Oral)  Resp 18  SpO2 98%  Physical Exam  Nursing note and vitals reviewed. Constitutional: She appears well-developed and well-nourished. No distress.  HENT:  Head: Normocephalic and atraumatic. No trismus in the jaw.  Right Ear: External ear normal.  Left Ear: External ear normal.  Nose: Nose normal.  Mouth/Throat: Uvula is midline and oropharynx is clear and moist. Mucous membranes are not pale  and not dry. No oral lesions. Abnormal dentition. Dental abscesses and dental caries present. No uvula swelling. No oropharyngeal exudate, posterior oropharyngeal edema or posterior oropharyngeal erythema.       Her pharynx is clear. No intraoral lesions. There is no swelling of the floor the mouth. There is no swelling over Stensen's duct. She has a few carious teeth on the top and on the bottom. There is no swelling of the gingiva. External exam reveals swelling and tenderness to palpation just below the jaw on the left standing from the angle of the jaw anteriorly. There is no fluctuance. This is very tender to palpation. No erythema or heat. No discrete adenopathy. It does not involve the parotid gland.  Eyes: Conjunctivae and EOM are normal. Pupils are equal, round, and reactive to light.  Neck: Normal range of motion.  Neck supple.  Cardiovascular: Normal rate, regular rhythm and normal heart sounds.   Pulmonary/Chest: Effort normal and breath sounds normal. No respiratory distress.  Lymphadenopathy:    She has no cervical adenopathy.  Skin: She is not diaphoretic.    ED Course  Procedures (including critical care time)  Results for orders placed during the hospital encounter of 02/18/11  POCT RAPID STREP A ([facility] ONLY)      Component Value Range   Streptococcus, Group A Screen (Direct) NEGATIVE  NEGATIVE       Labs Reviewed  POCT RAPID STREP A ([facility] ONLY)   No results found.   1. Dental abscess       MDM  This appears to be a dental infection, so we'll treat with clindamycin and pain meds and suggested she followup with her dentist as soon as possible.        Roque Lias, MD 02/18/11 [phone]

## 2011-03-04 ENCOUNTER — Emergency Department (INDEPENDENT_AMBULATORY_CARE_PROVIDER_SITE_OTHER)
Admission: EM | Admit: 2011-03-04 | Discharge: 2011-03-04 | Disposition: A | Discharge status: Home / Self Care | Payer: Medicare Other | Source: Home / Self Care | Attending: Emergency Medicine | Admitting: Emergency Medicine

## 2011-03-04 ENCOUNTER — Encounter (HOSPITAL_COMMUNITY): Payer: Self-pay | Admitting: Emergency Medicine

## 2011-03-04 DIAGNOSIS — T7840XA Allergy, unspecified, initial encounter: Secondary | ICD-10-CM

## 2011-03-04 DIAGNOSIS — Z888 Allergy status to other drugs, medicaments and biological substances status: Secondary | ICD-10-CM

## 2011-03-04 MED ORDER — METHYLPREDNISOLONE ACETATE 80 MG/ML IJ SUSP
INTRAMUSCULAR | Status: AC
  Filled 2011-03-04: qty 1

## 2011-03-04 MED ORDER — METHYLPREDNISOLONE ACETATE PF 80 MG/ML IJ SUSP
80.0000 mg | Freq: Once | INTRAMUSCULAR | Status: AC
  Administered 2011-03-04: 80 mg via INTRAMUSCULAR

## 2011-03-04 MED ORDER — PREDNISONE 10 MG PO TABS: ORAL_TABLET | ORAL | Status: DC

## 2011-03-04 NOTE — ED Provider Notes (Signed)
History     CSN: [id number]  Arrival date & time 03/04/11  1511   First MD Initiated Contact with Patient 03/04/11 1532      Chief Complaint  Patient presents with  . Rash  . Allergic Reaction    (Consider location/radiation/quality/duration/timing/severity/associated sxs/prior treatment) HPI Comments: Mrs. Koehne was seen here 2 weeks ago with a toothache and given clindamycin. She has been on it since then. Today she developed a generalized, mildly pruritic rash. She denies any difficulty breathing, wheezing, or shortness of breath. She has never had an allergic reaction to a medication previously. She's not taking any other new medications and has had no suspicious exposures or ingestions.  Patient is a 66 y.o. female presenting with rash and allergic reaction.  Rash   Allergic Reaction The primary symptoms are  rash.    Past Medical History  Diagnosis Date  . Stroke     x2 2011  . TIA (transient ischemic attack)   . Hypertension   . Hyperlipidemia   . GERD (gastroesophageal reflux disease)     Past Surgical History  Procedure Date  . Abdominal hysterectomy   . Kidney surgery     benign tumor removal  . Rectocele repair   . Cystocele repair   . Cataract extraction   . Orthopedic surgery     Wrist & elbow    History reviewed. No pertinent family history.  History  Substance Use Topics  . Smoking status: Former Games developer  . Smokeless tobacco: Not on file  . Alcohol Use: No    OB History    Grav Para Term Preterm Abortions TAB SAB Ect Mult Living                  Review of Systems  Constitutional: Negative for fever and chills.  Skin: Positive for rash. Negative for color change, pallor and wound.    Allergies  Sulfa antibiotics  Home Medications   Current Outpatient Rx  Name Route Sig Dispense Refill  . CLOPIDOGREL BISULFATE 75 MG PO TABS Oral Take 75 mg by mouth daily.      [name] ESCITALOPRAM OXALATE 5 MG PO TABS Oral Take 5 mg by mouth as needed.       [name] PANTOPRAZOLE SODIUM PO Oral Take by mouth daily.      [name] PREDNISONE 10 MG PO TABS  Take 4 tabs daily for 4 days, 3 tabs daily for 4 days, 2 tabs daily for 4 days, then 1 tab daily for 4 days.  Take all tabs at one time with food and preferably in the morning except for the first dose. 40 tablet 0  . RAMELTEON 8 MG PO TABS Oral Take 8 mg by mouth at bedtime.      [name] ROSUVASTATIN CALCIUM 5 MG PO TABS Oral Take 5 mg by mouth daily.      . TRIAMTERENE-HCTZ 37.5-25 MG PO CAPS Oral Take 1 capsule by mouth every morning.        BP 144/86  Pulse 88  Temp(Src) 97.9 F (36.6 C) (Oral)  Resp 20  SpO2 97%  Physical Exam  Nursing note and vitals reviewed. Constitutional: She appears well-developed and well-nourished. No distress.  HENT:  Head: Normocephalic and atraumatic.  Right Ear: External ear normal.  Left Ear: External ear normal.  Nose: Nose normal.  Mouth/Throat: Oropharynx is clear and moist. No oropharyngeal exudate.       No intraoral lesions. Her tooth infection has healed up. Pharynx was clear.  Pulmonary/Chest: Effort normal and breath sounds normal. No respiratory distress. She has no wheezes. She has no rales.  Skin: Skin is warm and dry. Rash noted. No abrasion, no bruising, no ecchymosis and no lesion noted. She is not diaphoretic. No erythema. No pallor.       There is a generalized, erythematous rash on the trunk and extremities. It is known the face and of the palms or soles.    ED Course  Procedures (including critical care time)  Labs Reviewed - No data to display No results found.   1. Allergic reaction caused by a drug       MDM          Roque Lias, MD 03/04/11 [phone]

## 2011-03-04 NOTE — ED Notes (Signed)
Pt here with sudden rash breakout all over body from chest to feet with feeling of swollen tongue and dry mouth.pt denies new med change or food allergy.red raised bumps seen all over.pt denies pain but min itching.the patient.pt has hx shingles in the past but states it was only in hair.

## 2012-06-07 ENCOUNTER — Encounter: Payer: Self-pay | Admitting: Internal Medicine

## 2012-06-07 DIAGNOSIS — Z860100 Personal history of colon polyps, unspecified: Secondary | ICD-10-CM

## 2012-06-07 HISTORY — DX: Personal history of colon polyps, unspecified: Z86.0100

## 2012-06-07 HISTORY — DX: Personal history of colonic polyps: Z86.010

## 2012-06-10 ENCOUNTER — Encounter: Payer: Self-pay | Admitting: Internal Medicine

## 2012-12-30 ENCOUNTER — Encounter: Payer: Self-pay | Admitting: Internal Medicine

## 2012-12-31 ENCOUNTER — Telehealth: Payer: Self-pay | Admitting: Emergency Medicine

## 2012-12-31 NOTE — Telephone Encounter (Signed)
LM FOR PT TO CALL BACK TO SET UP F/U APPT W/ DR YAMAGATA POST CT ON 12-11-12. I CALLED DR TANNENBAUM'S OFFICE AND PT APPT WAS CX ON 12-24-12.

## 2013-01-14 ENCOUNTER — Other Ambulatory Visit (HOSPITAL_COMMUNITY): Payer: Self-pay | Admitting: Interventional Radiology

## 2013-01-14 DIAGNOSIS — N2889 Other specified disorders of kidney and ureter: Secondary | ICD-10-CM

## 2013-01-28 ENCOUNTER — Ambulatory Visit
Admission: RE | Admit: 2013-01-28 | Discharge: 2013-01-28 | Disposition: A | Discharge status: Home / Self Care | Payer: MEDICARE | Source: Ambulatory Visit | Attending: Interventional Radiology | Admitting: Interventional Radiology

## 2013-01-28 DIAGNOSIS — N2889 Other specified disorders of kidney and ureter: Secondary | ICD-10-CM

## 2013-02-12 ENCOUNTER — Telehealth: Payer: Self-pay | Admitting: Emergency Medicine

## 2013-02-12 NOTE — Telephone Encounter (Signed)
PT CALLED TO SEE WHAT MEDS SHE CAN TAKE NOW WITH HER "KIDNEY SHRINKAGE".  IE: FOR HEADACHE OR MUSCLE ACHE. ANY MEDS SHE SHOULD AVOID?   4:25PM- CALLED PT BACK AFTER S/W DR Fredia Sorrow- SAID TO START MAINLY WITH TYLENOL IF NEEDED, BUT ANYTHING OTC IS OK .

## 2013-06-26 ENCOUNTER — Emergency Department (HOSPITAL_COMMUNITY)
Admission: EM | Admit: 2013-06-26 | Discharge: 2013-06-27 | Disposition: A | Discharge status: Home / Self Care | Payer: Medicare Other | Attending: Emergency Medicine | Admitting: Emergency Medicine

## 2013-06-26 DIAGNOSIS — Z8639 Personal history of other endocrine, nutritional and metabolic disease: Secondary | ICD-10-CM | POA: Insufficient documentation

## 2013-06-26 DIAGNOSIS — Z79899 Other long term (current) drug therapy: Secondary | ICD-10-CM | POA: Insufficient documentation

## 2013-06-26 DIAGNOSIS — R51 Headache: Secondary | ICD-10-CM | POA: Insufficient documentation

## 2013-06-26 DIAGNOSIS — R519 Headache, unspecified: Secondary | ICD-10-CM

## 2013-06-26 DIAGNOSIS — Z87891 Personal history of nicotine dependence: Secondary | ICD-10-CM | POA: Insufficient documentation

## 2013-06-26 DIAGNOSIS — Z7902 Long term (current) use of antithrombotics/antiplatelets: Secondary | ICD-10-CM | POA: Insufficient documentation

## 2013-06-26 DIAGNOSIS — Z862 Personal history of diseases of the blood and blood-forming organs and certain disorders involving the immune mechanism: Secondary | ICD-10-CM | POA: Insufficient documentation

## 2013-06-26 DIAGNOSIS — N390 Urinary tract infection, site not specified: Secondary | ICD-10-CM

## 2013-06-26 DIAGNOSIS — K219 Gastro-esophageal reflux disease without esophagitis: Secondary | ICD-10-CM | POA: Insufficient documentation

## 2013-06-26 DIAGNOSIS — Z860100 Personal history of colon polyps, unspecified: Secondary | ICD-10-CM | POA: Insufficient documentation

## 2013-06-26 DIAGNOSIS — I10 Essential (primary) hypertension: Secondary | ICD-10-CM

## 2013-06-26 DIAGNOSIS — R0602 Shortness of breath: Secondary | ICD-10-CM | POA: Insufficient documentation

## 2013-06-26 DIAGNOSIS — Z8673 Personal history of transient ischemic attack (TIA), and cerebral infarction without residual deficits: Secondary | ICD-10-CM | POA: Insufficient documentation

## 2013-06-26 DIAGNOSIS — H53149 Visual discomfort, unspecified: Secondary | ICD-10-CM | POA: Insufficient documentation

## 2013-06-26 DIAGNOSIS — R42 Dizziness and giddiness: Secondary | ICD-10-CM | POA: Insufficient documentation

## 2013-06-26 LAB — CBC WITH DIFFERENTIAL/PLATELET
Basophils Absolute: 0 10*3/uL (ref 0.0–0.1)
Basophils Relative: 0 % (ref 0–1)
Eosinophils Absolute: 0.4 10*3/uL (ref 0.0–0.7)
Eosinophils Relative: 3 % (ref 0–5)
HCT: 43.2 % (ref 36.0–46.0)
Hemoglobin: 14.4 g/dL (ref 12.0–15.0)
Lymphocytes Relative: 27 % (ref 12–46)
Lymphs Abs: 3.1 10*3/uL (ref 0.7–4.0)
MCH: 30.4 pg (ref 26.0–34.0)
MCHC: 33.3 g/dL (ref 30.0–36.0)
MCV: 91.1 fL (ref 78.0–100.0)
Monocytes Absolute: 1.1 10*3/uL — ABNORMAL HIGH (ref 0.1–1.0)
Monocytes Relative: 10 % (ref 3–12)
Neutro Abs: 7 10*3/uL (ref 1.7–7.7)
Neutrophils Relative %: 60 % (ref 43–77)
Platelets: 351 10*3/uL (ref 150–400)
RBC: 4.74 MIL/uL (ref 3.87–5.11)
RDW: 13.6 % (ref 11.5–15.5)
WBC: 11.6 10*3/uL — ABNORMAL HIGH (ref 4.0–10.5)

## 2013-06-26 MED ORDER — ACETAMINOPHEN 325 MG PO TABS
650.0000 mg | ORAL_TABLET | Freq: Once | ORAL | Status: AC
  Administered 2013-06-26: 650 mg via ORAL
  Filled 2013-06-26: qty 2

## 2013-06-26 MED ORDER — ONDANSETRON 8 MG PO TBDP
8.0000 mg | ORAL_TABLET | Freq: Once | ORAL | Status: AC
  Administered 2013-06-26: 8 mg via ORAL
  Filled 2013-06-26: qty 1

## 2013-06-26 NOTE — ED Notes (Signed)
Pt states she has had headache and high blood pressure readings all day today. Pt states she has had shortness of breath for months, but that it has become increasingly worse since. Pt has an appointment to perform a "pulmonary test" at [facility] next week to evaluate pt's SOB.

## 2013-06-27 ENCOUNTER — Emergency Department (HOSPITAL_COMMUNITY): Payer: Medicare Other

## 2013-06-27 LAB — URINALYSIS, ROUTINE W REFLEX MICROSCOPIC
Bilirubin Urine: NEGATIVE
Glucose, UA: NEGATIVE mg/dL
Hgb urine dipstick: NEGATIVE
Ketones, ur: NEGATIVE mg/dL
Nitrite: NEGATIVE
Protein, ur: NEGATIVE mg/dL
Specific Gravity, Urine: 1.007 (ref 1.005–1.030)
Urobilinogen, UA: 0.2 mg/dL (ref 0.0–1.0)
pH: 6.5 (ref 5.0–8.0)

## 2013-06-27 LAB — BASIC METABOLIC PANEL
BUN: 19 mg/dL (ref 6–23)
CO2: 27 mEq/L (ref 19–32)
Calcium: 9.6 mg/dL (ref 8.4–10.5)
Chloride: 98 mEq/L (ref 96–112)
Creatinine, Ser: 1.15 mg/dL — ABNORMAL HIGH (ref 0.50–1.10)
GFR calc Af Amer: 55 mL/min — ABNORMAL LOW (ref >90)
GFR calc non Af Amer: 48 mL/min — ABNORMAL LOW (ref >90)
Glucose, Bld: 111 mg/dL — ABNORMAL HIGH (ref 70–99)
Potassium: 4.1 mEq/L (ref 3.7–5.3)
Sodium: 140 mEq/L (ref 137–147)

## 2013-06-27 LAB — URINE MICROSCOPIC-ADD ON

## 2013-06-27 MED ORDER — CEFUROXIME AXETIL 250 MG PO TABS: 250.0000 mg | ORAL_TABLET | Freq: Two times a day (BID) | ORAL | Status: DC

## 2013-06-27 MED ORDER — CEFUROXIME AXETIL 250 MG PO TABS
250.0000 mg | ORAL_TABLET | Freq: Two times a day (BID) | ORAL | Status: DC
  Administered 2013-06-27: 250 mg via ORAL
  Filled 2013-06-27 (×4): qty 1

## 2013-06-27 NOTE — Discharge Instructions (Signed)
Continue on your current medications.  Take antibiotic as prescribed.  Take your blood pressure twice a day, in the morning and in the evening.  Keep a log of your numbers.  Bring that with you to your next doctor's appointment.  Return to the ER for chest pain, worsening shortness of breath, severe headache, weakness, or other concerning symptoms.    DASH Diet The DASH diet stands for "Dietary Approaches to Stop Hypertension." It is a healthy eating plan that has been shown to reduce high blood pressure (hypertension) in as little as 14 days, while also possibly providing other significant health benefits. These other health benefits include reducing the risk of breast cancer after menopause and reducing the risk of type 2 diabetes, heart disease, colon cancer, and stroke. Health benefits also include weight loss and slowing kidney failure in patients with chronic kidney disease.  DIET GUIDELINES  Limit salt (sodium). Your diet should contain less than 1500 mg of sodium daily.  Limit refined or processed carbohydrates. Your diet should include mostly whole grains. Desserts and added sugars should be used sparingly.  Include small amounts of heart-healthy fats. These types of fats include nuts, oils, and tub margarine. Limit saturated and trans fats. These fats have been shown to be harmful in the body. CHOOSING FOODS  The following food groups are based on a 2000 calorie diet. See your Registered Dietitian for individual calorie needs. Grains and Grain Products (6 to 8 servings daily)  Eat More Often: Whole-wheat bread, brown rice, whole-grain or wheat pasta, quinoa, popcorn without added fat or salt (air popped).  Eat Less Often: White bread, white pasta, white rice, cornbread. Vegetables (4 to 5 servings daily)  Eat More Often: Fresh, frozen, and canned vegetables. Vegetables may be raw, steamed, roasted, or grilled with a minimal amount of fat.  Eat Less Often/Avoid: Creamed or fried  vegetables. Vegetables in a cheese sauce. Fruit (4 to 5 servings daily)  Eat More Often: All fresh, canned (in natural juice), or frozen fruits. Dried fruits without added sugar. One hundred percent fruit juice ( cup [237 mL] daily).  Eat Less Often: Dried fruits with added sugar. Canned fruit in light or heavy syrup. [employer], Fish, and Poultry (2 servings or less daily. One serving is 3 to 4 oz [85-114 g]).  Eat More Often: Ninety percent or leaner ground beef, tenderloin, sirloin. Round cuts of beef, chicken breast, [place] breast. All fish. Grill, bake, or broil your meat. Nothing should be fried.  Eat Less Often/Avoid: Fatty cuts of meat, [place], or chicken leg, thigh, or wing. Fried cuts of meat or fish. Dairy (2 to 3 servings)  Eat More Often: Low-fat or fat-free milk, low-fat plain or light yogurt, reduced-fat or part-skim cheese.  Eat Less Often/Avoid: Milk (whole, 2%).Whole milk yogurt. Full-fat cheeses. Nuts, Seeds, and Legumes (4 to 5 servings per week)  Eat More Often: All without added salt.  Eat Less Often/Avoid: Salted nuts and seeds, canned beans with added salt. Fats and Sweets (limited)  Eat More Often: Vegetable oils, tub margarines without trans fats, sugar-free gelatin. Mayonnaise and salad dressings.  Eat Less Often/Avoid: Coconut oils, palm oils, butter, stick margarine, cream, half and half, cookies, candy, pie. FOR MORE INFORMATION The Dash Diet Eating Plan: www.dashdiet.org Document Released: 02/01/2011 Document Revised: 05/07/2011 Document Reviewed: 02/01/2011 [facility] Patient Information 2014 [place], [place].  Headaches, Frequently Asked Questions MIGRAINE HEADACHES Q: What is migraine? What causes it? How can I treat it? A: Generally, migraine headaches begin  as a dull ache. Then they develop into a constant, throbbing, and pulsating pain. You may experience pain at the temples. You may experience pain at the front or back of one or both sides of the  head. The pain is usually accompanied by a combination of:  Nausea.  Vomiting.  Sensitivity to light and noise. Some people (about 15%) experience an aura (see below) before an attack. The cause of migraine is believed to be chemical reactions in the brain. Treatment for migraine may include over-the-counter or prescription medications. It may also include self-help techniques. These include relaxation training and biofeedback.  Q: What is an aura? A: About 15% of people with migraine get an "aura". This is a sign of neurological symptoms that occur before a migraine headache. You may see wavy or jagged lines, dots, or flashing lights. You might experience tunnel vision or blind spots in one or both eyes. The aura can include visual or auditory hallucinations (something imagined). It may include disruptions in smell (such as strange odors), taste or touch. Other symptoms include:  Numbness.  A "pins and needles" sensation.  Difficulty in recalling or speaking the correct word. These neurological events may last as long as 60 minutes. These symptoms will fade as the headache begins. Q: What is a trigger? A: Certain physical or environmental factors can lead to or "trigger" a migraine. These include:  Foods.  Hormonal changes.  Weather.  Stress. It is important to remember that triggers are different for everyone. To help prevent migraine attacks, you need to figure out which triggers affect you. Keep a headache diary. This is a good way to track triggers. The diary will help you talk to your healthcare professional about your condition. Q: Does weather affect migraines? A: Bright sunshine, hot, humid conditions, and drastic changes in barometric pressure may lead to, or "trigger," a migraine attack in some people. But studies have shown that weather does not act as a trigger for everyone with migraines. Q: What is the link between migraine and hormones? A: Hormones start and regulate  many of your body's functions. Hormones keep your body in balance within a constantly changing environment. The levels of hormones in your body are unbalanced at times. Examples are during menstruation, pregnancy, or menopause. That can lead to a migraine attack. In fact, about three quarters of all women with migraine report that their attacks are related to the menstrual cycle.  Q: Is there an increased risk of stroke for migraine sufferers? A: The likelihood of a migraine attack causing a stroke is very remote. That is not to say that migraine sufferers cannot have a stroke associated with their migraines. In persons under age 40, the most common associated factor for stroke is migraine headache. But over the course of a person's normal life span, the occurrence of migraine headache may actually be associated with a reduced risk of dying from cerebrovascular disease due to stroke.  Q: What are acute medications for migraine? A: Acute medications are used to treat the pain of the headache after it has started. Examples over-the-counter medications, NSAIDs, ergots, and triptans.  Q: What are the triptans? A: Triptans are the newest class of abortive medications. They are specifically targeted to treat migraine. Triptans are vasoconstrictors. They moderate some chemical reactions in the brain. The triptans work on receptors in your brain. Triptans help to restore the balance of a neurotransmitter called serotonin. Fluctuations in levels of serotonin are thought to be a main cause of  migraine.  Q: Are over-the-counter medications for migraine effective? A: Over-the-counter, or "OTC," medications may be effective in relieving mild to moderate pain and associated symptoms of migraine. But you should see your caregiver before beginning any treatment regimen for migraine.  Q: What are preventive medications for migraine? A: Preventive medications for migraine are sometimes referred to as "prophylactic"  treatments. They are used to reduce the frequency, severity, and length of migraine attacks. Examples of preventive medications include antiepileptic medications, antidepressants, beta-blockers, calcium channel blockers, and NSAIDs (nonsteroidal anti-inflammatory drugs). Q: Why are anticonvulsants used to treat migraine? A: During the past few years, there has been an increased interest in antiepileptic drugs for the prevention of migraine. They are sometimes referred to as "anticonvulsants". Both epilepsy and migraine may be caused by similar reactions in the brain.  Q: Why are antidepressants used to treat migraine? A: Antidepressants are typically used to treat people with depression. They may reduce migraine frequency by regulating chemical levels, such as serotonin, in the brain.  Q: What alternative therapies are used to treat migraine? A: The term "alternative therapies" is often used to describe treatments considered outside the scope of conventional Western medicine. Examples of alternative therapy include acupuncture, acupressure, and yoga. Another common alternative treatment is herbal therapy. Some herbs are believed to relieve headache pain. Always discuss alternative therapies with your caregiver before proceeding. Some herbal products contain arsenic and other toxins. TENSION HEADACHES Q: What is a tension-type headache? What causes it? How can I treat it? A: Tension-type headaches occur randomly. They are often the result of temporary stress, anxiety, fatigue, or anger. Symptoms include soreness in your temples, a tightening band-like sensation around your head (a "vice-like" ache). Symptoms can also include a pulling feeling, pressure sensations, and contracting head and neck muscles. The headache begins in your forehead, temples, or the back of your head and neck. Treatment for tension-type headache may include over-the-counter or prescription medications. Treatment may also include  self-help techniques such as relaxation training and biofeedback. CLUSTER HEADACHES Q: What is a cluster headache? What causes it? How can I treat it? A: Cluster headache gets its name because the attacks come in groups. The pain arrives with little, if any, warning. It is usually on one side of the head. A tearing or bloodshot eye and a runny nose on the same side of the headache may also accompany the pain. Cluster headaches are believed to be caused by chemical reactions in the brain. They have been described as the most severe and intense of any headache type. Treatment for cluster headache includes prescription medication and oxygen. SINUS HEADACHES Q: What is a sinus headache? What causes it? How can I treat it? A: When a cavity in the bones of the face and skull (a sinus) becomes inflamed, the inflammation will cause localized pain. This condition is usually the result of an allergic reaction, a tumor, or an infection. If your headache is caused by a sinus blockage, such as an infection, you will probably have a fever. An x-ray will confirm a sinus blockage. Your caregiver's treatment might include antibiotics for the infection, as well as antihistamines or decongestants.  REBOUND HEADACHES Q: What is a rebound headache? What causes it? How can I treat it? A: A pattern of taking acute headache medications too often can lead to a condition known as "rebound headache." A pattern of taking too much headache medication includes taking it more than 2 days per week or in excessive amounts. That  means more than the label or a caregiver advises. With rebound headaches, your medications not only stop relieving pain, they actually begin to cause headaches. Doctors treat rebound headache by tapering the medication that is being overused. Sometimes your caregiver will gradually substitute a different type of treatment or medication. Stopping may be a challenge. Regularly overusing a medication increases the  potential for serious side effects. Consult a caregiver if you regularly use headache medications more than 2 days per week or more than the label advises. ADDITIONAL QUESTIONS AND ANSWERS Q: What is biofeedback? A: Biofeedback is a self-help treatment. Biofeedback uses special equipment to monitor your body's involuntary physical responses. Biofeedback monitors:  Breathing.  Pulse.  Heart rate.  Temperature.  Muscle tension.  Brain activity. Biofeedback helps you refine and perfect your relaxation exercises. You learn to control the physical responses that are related to stress. Once the technique has been mastered, you do not need the equipment any more. Q: Are headaches hereditary? A: Four out of five (80%) of people that suffer report a family history of migraine. Scientists are not sure if this is genetic or a family predisposition. Despite the uncertainty, a child has a 50% chance of having migraine if one parent suffers. The child has a 75% chance if both parents suffer.  Q: Can children get headaches? A: By the time they reach high school, most young people have experienced some type of headache. Many safe and effective approaches or medications can prevent a headache from occurring or stop it after it has begun.  Q: What type of doctor should I see to diagnose and treat my headache? A: Start with your primary caregiver. Discuss his or her experience and approach to headaches. Discuss methods of classification, diagnosis, and treatment. Your caregiver may decide to recommend you to a headache specialist, depending upon your symptoms or other physical conditions. Having diabetes, allergies, etc., may require a more comprehensive and inclusive approach to your headache. The National Headache Foundation will provide, upon request, a list of [facility] physician members in your state. Document Released: 05/05/2003 Document Revised: 05/07/2011 Document Reviewed: 10/13/2007 [facility] Patient  Information 2014 [place].  Hypertension As your heart beats, it forces blood through your arteries. This force is your blood pressure. If the pressure is too high, it is called hypertension (HTN) or high blood pressure. HTN is dangerous because you may have it and not know it. High blood pressure may mean that your heart has to work harder to pump blood. Your arteries may be narrow or stiff. The extra work puts you at risk for heart disease, stroke, and other problems.  Blood pressure consists of two numbers, a higher number over a lower, 110/72, for example. It is stated as "110 over 72." The ideal is below 120 for the top number (systolic) and under 80 for the bottom (diastolic). Write down your blood pressure today. You should pay close attention to your blood pressure if you have certain conditions such as:  Heart failure.  Prior heart attack.  Diabetes  Chronic kidney disease.  Prior stroke.  Multiple risk factors for heart disease. To see if you have HTN, your blood pressure should be measured while you are seated with your arm held at the level of the heart. It should be measured at least twice. A one-time elevated blood pressure reading (especially in the Emergency Department) does not mean that you need treatment. There may be conditions in which the blood pressure is different between your  right and left arms. It is important to see your caregiver soon for a recheck. Most people have essential hypertension which means that there is not a specific cause. This type of high blood pressure may be lowered by changing lifestyle factors such as:  Stress.  Smoking.  Lack of exercise.  Excessive weight.  Drug/tobacco/alcohol use.  Eating less salt. Most people do not have symptoms from high blood pressure until it has caused damage to the body. Effective treatment can often prevent, delay or reduce that damage. TREATMENT  When a cause has been identified, treatment for high  blood pressure is directed at the cause. There are a large number of medications to treat HTN. These fall into several categories, and your caregiver will help you select the medicines that are best for you. Medications may have side effects. You should review side effects with your caregiver. If your blood pressure stays high after you have made lifestyle changes or started on medicines,   Your medication(s) may need to be changed.  Other problems may need to be addressed.  Be certain you understand your prescriptions, and know how and when to take your medicine.  Be sure to follow up with your caregiver within the time frame advised (usually within two weeks) to have your blood pressure rechecked and to review your medications.  If you are taking more than one medicine to lower your blood pressure, make sure you know how and at what times they should be taken. Taking two medicines at the same time can result in blood pressure that is too low. SEEK IMMEDIATE MEDICAL CARE IF:  You develop a severe headache, blurred or changing vision, or confusion.  You have unusual weakness or numbness, or a faint feeling.  You have severe chest or abdominal pain, vomiting, or breathing problems. MAKE SURE YOU:   Understand these instructions.  Will watch your condition.  Will get help right away if you are not doing well or get worse. Document Released: 02/12/2005 Document Revised: 05/07/2011 Document Reviewed: 10/03/2007 [facility] Patient Information 2014 [place].  Urinary Tract Infection Urinary tract infections (UTIs) can develop anywhere along your urinary tract. Your urinary tract is your body's drainage system for removing wastes and extra water. Your urinary tract includes two kidneys, two ureters, a bladder, and a urethra. Your kidneys are a pair of bean-shaped organs. Each kidney is about the size of your fist. They are located below your ribs, one on each side of your  spine. CAUSES Infections are caused by microbes, which are microscopic organisms, including fungi, viruses, and bacteria. These organisms are so small that they can only be seen through a microscope. Bacteria are the microbes that most commonly cause UTIs. SYMPTOMS  Symptoms of UTIs may vary by age and gender of the patient and by the location of the infection. Symptoms in young women typically include a frequent and intense urge to urinate and a painful, burning feeling in the bladder or urethra during urination. Older women and men are more likely to be tired, shaky, and weak and have muscle aches and abdominal pain. A fever may mean the infection is in your kidneys. Other symptoms of a kidney infection include pain in your back or sides below the ribs, nausea, and vomiting. DIAGNOSIS To diagnose a UTI, your caregiver will ask you about your symptoms. Your caregiver also will ask to provide a urine sample. The urine sample will be tested for bacteria and white blood cells. White blood cells are  made by your body to help fight infection. TREATMENT  Typically, UTIs can be treated with medication. Because most UTIs are caused by a bacterial infection, they usually can be treated with the use of antibiotics. The choice of antibiotic and length of treatment depend on your symptoms and the type of bacteria causing your infection. HOME CARE INSTRUCTIONS  If you were prescribed antibiotics, take them exactly as your caregiver instructs you. Finish the medication even if you feel better after you have only taken some of the medication.  Drink enough water and fluids to keep your urine clear or pale yellow.  Avoid caffeine, tea, and carbonated beverages. They tend to irritate your bladder.  Empty your bladder often. Avoid holding urine for long periods of time.  Empty your bladder before and after sexual intercourse.  After a bowel movement, women should cleanse from front to back. Use each tissue only  once. SEEK MEDICAL CARE IF:   You have back pain.  You develop a fever.  Your symptoms do not begin to resolve within 3 days. SEEK IMMEDIATE MEDICAL CARE IF:   You have severe back pain or lower abdominal pain.  You develop chills.  You have nausea or vomiting.  You have continued burning or discomfort with urination. MAKE SURE YOU:   Understand these instructions.  Will watch your condition.  Will get help right away if you are not doing well or get worse. Document Released: 11/22/2004 Document Revised: 08/14/2011 Document Reviewed: 03/23/2011 [facility] Patient Information 2014 [place].

## 2013-06-27 NOTE — ED Provider Notes (Signed)
CSN: [id number]     Arrival date & time 06/26/13  2249 History   First MD Initiated Contact with Patient 06/26/13 2307     Chief Complaint  Patient presents with  . Headache  . Hypertension  . Shortness of Breath     (Consider location/radiation/quality/duration/timing/severity/associated sxs/prior Treatment) HPI 68 yo female presents to the ER from home with complaint of right posterior headache, photophobia, elevated blood pressure, and "not feeling well" x 1 day.  Pt has history of HTN, TIA/stroke, gerd.  Pt reports she saw her new PCP on Monday, had her beta blocker medication changed to hyzaar, and was started on wellbutrin for insomnia.  At her visit on Monday, she reports her BP was 174/80.  Today she woke up and felt dizzy, lightheaded.  She has been checking her BP throughout the day, and it has been elevated 170s/100.  No chest pain, abd pain, sob.  HA is behind right ear, goes down neck.   Past Medical History  Diagnosis Date  . Stroke     x2 2011  . TIA (transient ischemic attack)   . Hypertension   . Hyperlipidemia   . GERD (gastroesophageal reflux disease)   . GASTRIC ULCER, ACUTE 04/07/2007    Annotation: EGD 04/07/07 Qualifier: Diagnosis of  By: Carlean Purl MD, Dimas Millin Personal history of colonic adenoma 06/07/2012   Past Surgical History  Procedure Laterality Date  . Abdominal hysterectomy    . Kidney surgery      benign tumor removal  . Rectocele repair    . Cystocele repair    . Cataract extraction    . Orthopedic surgery      Wrist & elbow  . Esophagogastroduodenoscopy    . Colonoscopy     No family history on file. History  Substance Use Topics  . Smoking status: Former Research scientist (life sciences)  . Smokeless tobacco: Not on file  . Alcohol Use: No   OB History   Grav Para Term Preterm Abortions TAB SAB Ect Mult Living                 Review of Systems  All other systems reviewed and are negative.     Allergies  Sulfa antibiotics  Home Medications   Prior  to Admission medications   Medication Sig Start Date End Date Taking? Authorizing Provider  acetaminophen (TYLENOL) 500 MG tablet Take 1,000 mg by mouth every 6 (six) hours as needed for mild pain.   Yes Historical Provider, MD  buPROPion (WELLBUTRIN SR) 150 MG 12 hr tablet Take 150 mg by mouth 2 (two) times daily.   Yes Historical Provider, MD  clopidogrel (PLAVIX) 75 MG tablet Take 75 mg by mouth daily.     Yes Historical Provider, MD  gemfibrozil (LOPID) 600 MG tablet Take 600 mg by mouth 2 (two) times daily before a meal.   Yes Historical Provider, MD  losartan-hydrochlorothiazide (HYZAAR) 100-25 MG per tablet Take 1 tablet by mouth daily.   Yes Historical Provider, MD  omeprazole (PRILOSEC) 20 MG capsule Take 20 mg by mouth daily.   Yes Historical Provider, MD   BP 157/76  Pulse 89  Temp(Src) 98 F (36.7 C) (Oral)  Resp 15  SpO2 96% Physical Exam  Nursing note and vitals reviewed. Constitutional: She is oriented to person, place, and time. She appears well-developed and well-nourished.  HENT:  Head: Normocephalic and atraumatic.  Right Ear: External ear normal.  Left Ear: External ear normal.  Nose: Nose  normal.  Mouth/Throat: Oropharynx is clear and moist.  ttp to right posterior head behind ear, reproduces pain  Eyes: Conjunctivae and EOM are normal. Pupils are equal, round, and reactive to light.  Neck: Normal range of motion. Neck supple. No JVD present. No tracheal deviation present. No thyromegaly present.  Cardiovascular: Normal rate, regular rhythm, normal heart sounds and intact distal pulses.  Exam reveals no gallop and no friction rub.   No murmur heard. Pulmonary/Chest: Effort normal and breath sounds normal. No stridor. No respiratory distress. She has no wheezes. She has no rales. She exhibits no tenderness.  Abdominal: Soft. Bowel sounds are normal. She exhibits no distension and no mass. There is no tenderness. There is no rebound and no guarding.  Musculoskeletal:  Normal range of motion. She exhibits no edema and no tenderness.  Lymphadenopathy:    She has no cervical adenopathy.  Neurological: She is alert and oriented to person, place, and time. She has normal reflexes. No cranial nerve deficit. She exhibits normal muscle tone. Coordination normal.  Skin: Skin is warm and dry. No rash noted. No erythema. No pallor.  Psychiatric: She has a normal mood and affect. Her behavior is normal. Judgment and thought content normal.    ED Course  Procedures (including critical care time) Labs Review Labs Reviewed  CBC WITH DIFFERENTIAL - Abnormal; Notable for the following:    WBC 11.6 (*)    Monocytes Absolute 1.1 (*)    All other components within normal limits  URINALYSIS, ROUTINE W REFLEX MICROSCOPIC - Abnormal; Notable for the following:    APPearance CLOUDY (*)    Leukocytes, UA LARGE (*)    All other components within normal limits  BASIC METABOLIC PANEL - Abnormal; Notable for the following:    Glucose, Bld 111 (*)    Creatinine, Ser 1.15 (*)    GFR calc non Af Amer 48 (*)    GFR calc Af Amer 55 (*)    All other components within normal limits  URINE MICROSCOPIC-ADD ON - Abnormal; Notable for the following:    Squamous Epithelial / LPF FEW (*)    All other components within normal limits  URINE CULTURE    Imaging Review Ct Head Wo Contrast  06/27/2013   CLINICAL DATA:  Headaches and blurred vision. History of TIA and hypertension.  EXAM: CT HEAD WITHOUT CONTRAST  TECHNIQUE: Contiguous axial images were obtained from the base of the skull through the vertex without intravenous contrast.  COMPARISON:  CT HEAD W/O CM dated 12/15/2009; MR HEAD W/O CM dated 06/30/2009  FINDINGS: Ventricles and sulci appear symmetrical. Old lacunar infarct in the left basal ganglia and right periventricular white matter. No mass effect or midline shift. No abnormal extra-axial fluid collections. Gray-white matter junctions are distinct. Basal cisterns are not effaced.  No evidence of acute intracranial hemorrhage. No depressed skull fractures. Mucosal thickening in the paranasal sinuses. Vascular calcifications.  IMPRESSION: No acute intracranial abnormalities. Old lacunar infarcts in the deep white matter. Mucosal thickening in the paranasal sinuses.   Electronically Signed   By: Lucienne Capers M.D.   On: 06/27/2013 00:33     EKG Interpretation   Date/Time:  Friday Jun 26 2013 22:56:28 EDT Ventricular Rate:  85 PR Interval:  181 QRS Duration: 99 QT Interval:  389 QTC Calculation: 463 R Axis:   63 Text Interpretation:  Sinus rhythm No significant change since last  tracing Confirmed by Kortne All  MD, Avyon Herendeen ([id number]) on 06/26/2013 11:19:01 PM  MDM   Final diagnoses:  Hypertension  Headache  Urinary tract infection    68 yo female who has had elevated BP today, but by her report had elevated bp in office on Monday.  No signs of stroke, no end organ damage.  Urine with UTI, will treat.  Pt to f/u with pcm this week, to keep journal of bps over weekend.  Kalman Drape, MD 06/27/13 [phone]

## 2013-06-27 NOTE — ED Notes (Signed)
Patient transported to CT 

## 2013-06-29 LAB — URINE CULTURE
Colony Count: NO GROWTH
Culture: NO GROWTH

## 2013-07-16 ENCOUNTER — Other Ambulatory Visit (HOSPITAL_COMMUNITY): Payer: MEDICARE

## 2014-01-12 ENCOUNTER — Other Ambulatory Visit (HOSPITAL_COMMUNITY): Payer: Self-pay | Admitting: Interventional Radiology

## 2014-01-12 DIAGNOSIS — N2889 Other specified disorders of kidney and ureter: Secondary | ICD-10-CM

## 2014-05-13 ENCOUNTER — Emergency Department (HOSPITAL_BASED_OUTPATIENT_CLINIC_OR_DEPARTMENT_OTHER): Payer: Medicare Other

## 2014-05-13 ENCOUNTER — Encounter (HOSPITAL_BASED_OUTPATIENT_CLINIC_OR_DEPARTMENT_OTHER): Payer: Self-pay | Admitting: *Deleted

## 2014-05-13 ENCOUNTER — Emergency Department (HOSPITAL_BASED_OUTPATIENT_CLINIC_OR_DEPARTMENT_OTHER)
Admission: EM | Admit: 2014-05-13 | Discharge: 2014-05-13 | Disposition: A | Discharge status: Home / Self Care | Payer: Medicare Other | Attending: Emergency Medicine | Admitting: Emergency Medicine

## 2014-05-13 DIAGNOSIS — Z8673 Personal history of transient ischemic attack (TIA), and cerebral infarction without residual deficits: Secondary | ICD-10-CM | POA: Insufficient documentation

## 2014-05-13 DIAGNOSIS — R600 Localized edema: Secondary | ICD-10-CM

## 2014-05-13 DIAGNOSIS — Z86018 Personal history of other benign neoplasm: Secondary | ICD-10-CM | POA: Insufficient documentation

## 2014-05-13 DIAGNOSIS — R2242 Localized swelling, mass and lump, left lower limb: Secondary | ICD-10-CM | POA: Diagnosis not present

## 2014-05-13 DIAGNOSIS — Z87891 Personal history of nicotine dependence: Secondary | ICD-10-CM | POA: Diagnosis not present

## 2014-05-13 DIAGNOSIS — R5383 Other fatigue: Secondary | ICD-10-CM | POA: Diagnosis not present

## 2014-05-13 DIAGNOSIS — Z7902 Long term (current) use of antithrombotics/antiplatelets: Secondary | ICD-10-CM | POA: Diagnosis not present

## 2014-05-13 DIAGNOSIS — J441 Chronic obstructive pulmonary disease with (acute) exacerbation: Secondary | ICD-10-CM | POA: Diagnosis not present

## 2014-05-13 DIAGNOSIS — M7989 Other specified soft tissue disorders: Secondary | ICD-10-CM

## 2014-05-13 DIAGNOSIS — K219 Gastro-esophageal reflux disease without esophagitis: Secondary | ICD-10-CM | POA: Diagnosis not present

## 2014-05-13 DIAGNOSIS — I10 Essential (primary) hypertension: Secondary | ICD-10-CM | POA: Insufficient documentation

## 2014-05-13 DIAGNOSIS — Z86010 Personal history of colonic polyps: Secondary | ICD-10-CM | POA: Insufficient documentation

## 2014-05-13 DIAGNOSIS — Z79899 Other long term (current) drug therapy: Secondary | ICD-10-CM | POA: Insufficient documentation

## 2014-05-13 DIAGNOSIS — Z8739 Personal history of other diseases of the musculoskeletal system and connective tissue: Secondary | ICD-10-CM | POA: Diagnosis not present

## 2014-05-13 HISTORY — DX: Chronic obstructive pulmonary disease, unspecified: J44.9

## 2014-05-13 HISTORY — DX: Benign neoplasm of unspecified kidney: D30.00

## 2014-05-13 LAB — COMPREHENSIVE METABOLIC PANEL
ALT: 20 U/L (ref 0–35)
AST: 24 U/L (ref 0–37)
Albumin: 4.4 g/dL (ref 3.5–5.2)
Alkaline Phosphatase: 103 U/L (ref 39–117)
Anion gap: 9 (ref 5–15)
BUN: 17 mg/dL (ref 6–23)
CO2: 28 mmol/L (ref 19–32)
Calcium: 9.2 mg/dL (ref 8.4–10.5)
Chloride: 102 mmol/L (ref 96–112)
Creatinine, Ser: 1.02 mg/dL (ref 0.50–1.10)
GFR calc Af Amer: 64 mL/min — ABNORMAL LOW (ref >90)
GFR calc non Af Amer: 55 mL/min — ABNORMAL LOW (ref >90)
Glucose, Bld: 106 mg/dL — ABNORMAL HIGH (ref 70–99)
Potassium: 4 mmol/L (ref 3.5–5.1)
Sodium: 139 mmol/L (ref 135–145)
Total Bilirubin: 0.5 mg/dL (ref 0.3–1.2)
Total Protein: 8 g/dL (ref 6.0–8.3)

## 2014-05-13 LAB — CBC WITH DIFFERENTIAL/PLATELET
Basophils Absolute: 0 10*3/uL (ref 0.0–0.1)
Basophils Relative: 0 % (ref 0–1)
Eosinophils Absolute: 0.3 10*3/uL (ref 0.0–0.7)
Eosinophils Relative: 3 % (ref 0–5)
HCT: 42.9 % (ref 36.0–46.0)
Hemoglobin: 14 g/dL (ref 12.0–15.0)
Lymphocytes Relative: 26 % (ref 12–46)
Lymphs Abs: 2.7 10*3/uL (ref 0.7–4.0)
MCH: 30.5 pg (ref 26.0–34.0)
MCHC: 32.6 g/dL (ref 30.0–36.0)
MCV: 93.5 fL (ref 78.0–100.0)
Monocytes Absolute: 0.7 10*3/uL (ref 0.1–1.0)
Monocytes Relative: 7 % (ref 3–12)
Neutro Abs: 6.4 10*3/uL (ref 1.7–7.7)
Neutrophils Relative %: 64 % (ref 43–77)
Platelets: 316 10*3/uL (ref 150–400)
RBC: 4.59 MIL/uL (ref 3.87–5.11)
RDW: 13.9 % (ref 11.5–15.5)
WBC: 10.1 10*3/uL (ref 4.0–10.5)

## 2014-05-13 LAB — TROPONIN I: Troponin I: <0.03 ng/mL (ref <0.031)

## 2014-05-13 LAB — BRAIN NATRIURETIC PEPTIDE: B Natriuretic Peptide: 99.3 pg/mL (ref 0.0–100.0)

## 2014-05-13 MED ORDER — LOSARTAN POTASSIUM-HCTZ 100-25 MG PO TABS: 1.0000 | ORAL_TABLET | Freq: Every day | ORAL | Status: DC

## 2014-05-13 MED ORDER — CLONIDINE HCL 0.1 MG PO TABS
0.1000 mg | ORAL_TABLET | Freq: Once | ORAL | Status: AC
  Administered 2014-05-13: 0.1 mg via ORAL
  Filled 2014-05-13: qty 1

## 2014-05-13 NOTE — ED Notes (Signed)
MD at bedside discussing test results and dispo plan of care. 

## 2014-05-13 NOTE — ED Notes (Addendum)
Reports feet swelling x 3-4 weeks- pt reports she broke left foot 3 years ago and it swells intermittently- eval by ortho doctor today showed foot was fine- recommended to come to to ED for further eval for possible CHF- pt's b/p 208/105 in triage- denies chest pain. Pt also states she was supposed to have a CT scan today to eval kidney and was told to come to ED because bp was so high

## 2014-05-13 NOTE — ED Provider Notes (Signed)
CSN: [id number]     Arrival date & time 05/13/14  1423 History   First MD Initiated Contact with Patient 05/13/14 1541     Chief Complaint  Patient presents with  . Leg Swelling     (Consider location/radiation/quality/duration/timing/severity/associated sxs/prior Treatment) HPI Comments: Patient presents with leg swelling. She has a history of hypertension hyperlipidemia as well as a prior stroke and TIA. She's been off of all her medicines for about a year due to loss of insurance. She states that her blood pressures been uncontrolled during that time. She's had intermittent swelling of her left leg over the last year or so but over the last month her left leg is been persistently swollen and over the last week her right leg is become swollen. She's had some tenderness to both of her legs. She's had shortness of breath over the last couple years but it's unchanged from her baseline. She wasn't prior smoker but quit in 2012. She denies any chest pain or tightness. She denies any pleuritic-type chest pain. She denies any recent injuries to her legs. She had a prior orthopedic injury to her left foot but that was in 2012. She denies any dizziness. She's had some chronic headaches to the right side of her head since she had her prior stroke but this is unchanged from her baseline. She denies any numbness or weakness to her extremities other than some generalized fatigue.   Past Medical History  Diagnosis Date  . Stroke     x2 2011  . TIA (transient ischemic attack)   . Hypertension   . Hyperlipidemia   . GERD (gastroesophageal reflux disease)   . GASTRIC ULCER, ACUTE 04/07/2007    Annotation: EGD 04/07/07 Qualifier: Diagnosis of  By: Carlean Purl MD, Dimas Millin Personal history of colonic adenoma 06/07/2012  . Kidney tumor (benign)   . COPD (chronic obstructive pulmonary disease)    Past Surgical History  Procedure Laterality Date  . Abdominal hysterectomy    . Kidney surgery      benign  tumor removal  . Rectocele repair    . Cystocele repair    . Cataract extraction    . Orthopedic surgery      Wrist & elbow  . Esophagogastroduodenoscopy    . Colonoscopy     No family history on file. History  Substance Use Topics  . Smoking status: Former Research scientist (life sciences)  . Smokeless tobacco: Not on file  . Alcohol Use: No   OB History    No data available     Review of Systems  Constitutional: Positive for fatigue. Negative for fever, chills and diaphoresis.  HENT: Negative for congestion, rhinorrhea and sneezing.   Eyes: Negative.   Respiratory: Positive for shortness of breath (At baseline). Negative for cough and chest tightness.   Cardiovascular: Positive for leg swelling. Negative for chest pain.  Gastrointestinal: Negative for nausea, vomiting, abdominal pain, diarrhea and blood in stool.  Genitourinary: Negative for frequency, hematuria, flank pain and difficulty urinating.  Musculoskeletal: Negative for back pain and arthralgias.  Skin: Negative for rash.  Neurological: Negative for dizziness, speech difficulty, weakness, numbness and headaches.      Allergies  Sulfa antibiotics  Home Medications   Prior to Admission medications   Medication Sig Start Date End Date Taking? Authorizing Provider  acetaminophen (TYLENOL) 500 MG tablet Take 1,000 mg by mouth every 6 (six) hours as needed for mild pain.   Yes Historical Provider, MD  buPROPion [facility] SR)  150 MG 12 hr tablet Take 150 mg by mouth 2 (two) times daily.    Historical Provider, MD  cefUROXime (CEFTIN) 250 MG tablet Take 1 tablet (250 mg total) by mouth 2 (two) times daily with a meal. 06/27/13   Linton Flemings, MD  clopidogrel (PLAVIX) 75 MG tablet Take 75 mg by mouth daily.      Historical Provider, MD  gemfibrozil (LOPID) 600 MG tablet Take 600 mg by mouth 2 (two) times daily before a meal.    Historical Provider, MD  losartan-hydrochlorothiazide (HYZAAR) 100-25 MG per tablet Take 1 tablet by mouth daily.  05/13/14   Malvin Johns, MD  omeprazole (PRILOSEC) 20 MG capsule Take 20 mg by mouth daily.    Historical Provider, MD   BP 182/92 mmHg  Pulse 77  Temp(Src) 97.8 F (36.6 C) (Oral)  Resp 14  Ht 5' 5.5" (1.664 m)  Wt 222 lb (100.699 kg)  BMI 36.37 kg/m2  SpO2 93% Physical Exam  Constitutional: She is oriented to person, place, and time. She appears well-developed and well-nourished.  HENT:  Head: Normocephalic and atraumatic.  Mouth/Throat: Oropharynx is clear and moist.  Eyes: Pupils are equal, round, and reactive to light.  Neck: Normal range of motion. Neck supple.  Cardiovascular: Normal rate, regular rhythm and normal heart sounds.   Pulmonary/Chest: Effort normal and breath sounds normal. No respiratory distress. She has no wheezes. She has no rales. She exhibits no tenderness.  Abdominal: Soft. Bowel sounds are normal. There is no tenderness. There is no rebound and no guarding.  Musculoskeletal: Normal range of motion. She exhibits edema (2+ pitting edema bilaterally, left slightly greater than right).  Lymphadenopathy:    She has no cervical adenopathy.  Neurological: She is alert and oriented to person, place, and time. She has normal strength. No cranial nerve deficit or sensory deficit. GCS eye subscore is 4. GCS verbal subscore is 5. GCS motor subscore is 6.  Skin: Skin is warm and dry. No rash noted.  Psychiatric: She has a normal mood and affect.    ED Course  Procedures (including critical care time) Labs Review Labs Reviewed  COMPREHENSIVE METABOLIC PANEL - Abnormal; Notable for the following:    Glucose, Bld 106 (*)    GFR calc non Af Amer 55 (*)    GFR calc Af Amer 64 (*)    All other components within normal limits  CBC WITH DIFFERENTIAL/PLATELET  TROPONIN I  BRAIN NATRIURETIC PEPTIDE    Imaging Review Dg Chest 2 View  05/13/2014   CLINICAL DATA:  Leg swelling.  EXAM: CHEST  2 VIEW  COMPARISON:  CT 11/03/2010 and chest radiograph 12/12/2009  FINDINGS:  Heart and mediastinum are within normal limits. Heart size is normal. Atherosclerotic calcifications at the aortic arch. Lucency in the upper lungs compatible with emphysematous changes. No focal airspace disease. No acute bone abnormality.  IMPRESSION: No active cardiopulmonary disease.  Emphysematous changes.   Electronically Signed   By: Markus Daft M.D.   On: 05/13/2014 16:44   US Venous Img Lower Bilateral  05/13/2014   CLINICAL DATA:  Subacute onset of bilateral lower extremity swelling for 3 weeks, with left knee pain and swelling for 1 week. Initial encounter.  EXAM: BILATERAL LOWER EXTREMITY VENOUS DOPPLER ULTRASOUND  TECHNIQUE: Gray-scale sonography with graded compression, as well as color Doppler and duplex ultrasound were performed to evaluate the lower extremity deep venous systems from the level of the common femoral vein and including the common femoral, femoral,  profunda femoral, popliteal and calf veins including the posterior tibial, peroneal and gastrocnemius veins when visible. The superficial great saphenous vein was also interrogated. Spectral Doppler was utilized to evaluate flow at rest and with distal augmentation maneuvers in the common femoral, femoral and popliteal veins.  COMPARISON:  None.  FINDINGS: RIGHT LOWER EXTREMITY  Common Femoral Vein: No evidence of thrombus. Normal compressibility, respiratory phasicity and response to augmentation.  Saphenofemoral Junction: No evidence of thrombus. Normal compressibility and flow on color Doppler imaging.  Profunda Femoral Vein: No evidence of thrombus. Normal compressibility and flow on color Doppler imaging.  Femoral Vein: No evidence of thrombus. Normal compressibility, respiratory phasicity and response to augmentation.  Popliteal Vein: No evidence of thrombus. Normal compressibility, respiratory phasicity and response to augmentation.  Calf Veins: No evidence of thrombus. Normal compressibility and flow on color Doppler imaging.   Superficial Great Saphenous Vein: No evidence of thrombus. Normal compressibility and flow on color Doppler imaging.  Venous Reflux:  None.  Other Findings:  None.  LEFT LOWER EXTREMITY  Common Femoral Vein: No evidence of thrombus. Normal compressibility, respiratory phasicity and response to augmentation.  Saphenofemoral Junction: No evidence of thrombus. Normal compressibility and flow on color Doppler imaging.  Profunda Femoral Vein: No evidence of thrombus. Normal compressibility and flow on color Doppler imaging.  Femoral Vein: No evidence of thrombus. Normal compressibility, respiratory phasicity and response to augmentation.  Popliteal Vein: No evidence of thrombus. Normal compressibility, respiratory phasicity and response to augmentation.  Calf Veins: No evidence of thrombus. Normal compressibility and flow on color Doppler imaging.  Superficial Great Saphenous Vein: No evidence of thrombus. Normal compressibility and flow on color Doppler imaging.  Venous Reflux:  None.  Other Findings:  None.  IMPRESSION: No evidence of deep venous thrombosis.   Electronically Signed   By: Garald Balding M.D.   On: 05/13/2014 17:47     EKG Interpretation   Date/Time:  Thursday May 13 2014 15:02:14 EDT Ventricular Rate:  92 PR Interval:  180 QRS Duration: 88 QT Interval:  368 QTC Calculation: 455 R Axis:   66 Text Interpretation:  Normal sinus rhythm Biatrial enlargement Abnormal  ECG since last tracing no significant change Confirmed by Isadore Palecek  MD,  Moody Robben ([id number]) on 05/13/2014 4:10:55 PM      MDM   Final diagnoses:  Leg swelling  Essential hypertension  Pedal edema    Patient presents with markedly elevated blood pressure. She does not have any chest pain or acute shortness of breath. Her chest x-ray is clear without evidence of pulmonary edema. She does have some pedal edema which is been worsening over the last month or so. She was previously on losartan/hydrochlorothiazide. I will start  her back on this medication. She does not have any neurologic deficits. There is no evidence of a hypertensive emergency or urgency. She was given dose of clonidine in the ED and her blood pressure is starting to come down. She has no evidence of DVT. She does have insurance now and I gave her a list of local primary care physicians here at [facility] as an option for possible follow-up. I also advised her to call around if that is not an option for other primary care physicians. I advised her return here to the ED if she has any other worsening symptoms.    Malvin Johns, MD 05/13/14 [phone]

## 2014-05-13 NOTE — Discharge Instructions (Signed)
Edema °Edema is an abnormal buildup of fluids in your body tissues. Edema is somewhat dependent on gravity to pull the fluid to the lowest place in your body. That makes the condition more common in the legs and thighs (lower extremities). Painless swelling of the feet and ankles is common and becomes more likely as you get older. It is also common in looser tissues, like around your eyes.  °When the affected area is squeezed, the fluid may move out of that spot and leave a dent for a few moments. This dent is called pitting.  °CAUSES  °There are many possible causes of edema. Eating too much salt and being on your feet or sitting for a long time can cause edema in your legs and ankles. Hot weather may make edema worse. Common medical causes of edema include: °· Heart failure. °· Liver disease. °· Kidney disease. °· Weak blood vessels in your legs. °· Cancer. °· An injury. °· Pregnancy. °· Some medications. °· Obesity.  °SYMPTOMS  °Edema is usually painless. Your skin may look swollen or shiny.  °DIAGNOSIS  °Your health care provider may be able to diagnose edema by asking about your medical history and doing a physical exam. You may need to have tests such as X-rays, an electrocardiogram, or blood tests to check for medical conditions that may cause edema.  °TREATMENT  °Edema treatment depends on the cause. If you have heart, liver, or kidney disease, you need the treatment appropriate for these conditions. General treatment may include: °· Elevation of the affected body part above the level of your heart. °· Compression of the affected body part. Pressure from elastic bandages or support stockings squeezes the tissues and forces fluid back into the blood vessels. This keeps fluid from entering the tissues. °· Restriction of fluid and salt intake. °· Use of a water pill (diuretic). These medications are appropriate only for some types of edema. They pull fluid out of your body and make you urinate more often. This  gets rid of fluid and reduces swelling, but diuretics can have side effects. Only use diuretics as directed by your health care provider. °HOME CARE INSTRUCTIONS  °· Keep the affected body part above the level of your heart when you are lying down.   °· Do not sit still or stand for prolonged periods.   °· Do not put anything directly under your knees when lying down. °· Do not wear constricting clothing or garters on your upper legs.   °· Exercise your legs to work the fluid back into your blood vessels. This may help the swelling go down.   °· Wear elastic bandages or support stockings to reduce ankle swelling as directed by your health care provider.   °· Eat a low-salt diet to reduce fluid if your health care provider recommends it.   °· Only take medicines as directed by your health care provider.  °SEEK MEDICAL CARE IF:  °· Your edema is not responding to treatment. °· You have heart, liver, or kidney disease and notice symptoms of edema. °· You have edema in your legs that does not improve after elevating them.   °· You have sudden and unexplained weight gain. °SEEK IMMEDIATE MEDICAL CARE IF:  °· You develop shortness of breath or chest pain.   °· You cannot breathe when you lie down. °· You develop pain, redness, or warmth in the swollen areas.   °· You have heart, liver, or kidney disease and suddenly get edema. °· You have a fever and your symptoms suddenly get worse. °MAKE SURE YOU:  °·   Understand these instructions. °· Will watch your condition. °· Will get help right away if you are not doing well or get worse. °Document Released: 02/12/2005 Document Revised: 06/29/2013 Document Reviewed: 12/05/2012 °ExitCare® Patient Information ©2015 [employer]. This information is not intended to replace advice given to you by your health care provider. Make sure you discuss any questions you have with your health care provider. °Hypertension °Hypertension, commonly called high blood pressure, is when the force of  blood pumping through your arteries is too strong. Your arteries are the blood vessels that carry blood from your heart throughout your body. A blood pressure reading consists of a higher number over a lower number, such as 110/72. The higher number (systolic) is the pressure inside your arteries when your heart pumps. The lower number (diastolic) is the pressure inside your arteries when your heart relaxes. Ideally you want your blood pressure below 120/80. °Hypertension forces your heart to work harder to pump blood. Your arteries may become narrow or stiff. Having hypertension puts you at risk for heart disease, stroke, and other problems.  °RISK FACTORS °Some risk factors for high blood pressure are controllable. Others are not.  °Risk factors you cannot control include:  °· Race. You may be at higher risk if you are African American. °· Age. Risk increases with age. °· Gender. Men are at higher risk than women before age 45 years. After age 65, women are at higher risk than men. °Risk factors you can control include: °· Not getting enough exercise or physical activity. °· Being overweight. °· Getting too much fat, sugar, calories, or salt in your diet. °· Drinking too much alcohol. °SIGNS AND SYMPTOMS °Hypertension does not usually cause signs or symptoms. Extremely high blood pressure (hypertensive crisis) may cause headache, anxiety, shortness of breath, and nosebleed. °DIAGNOSIS  °To check if you have hypertension, your health care provider will measure your blood pressure while you are seated, with your arm held at the level of your heart. It should be measured at least twice using the same arm. Certain conditions can cause a difference in blood pressure between your right and left arms. A blood pressure reading that is higher than normal on one occasion does not mean that you need treatment. If one blood pressure reading is high, ask your health care provider about having it checked again. °TREATMENT    °Treating high blood pressure includes making lifestyle changes and possibly taking medicine. Living a healthy lifestyle can help lower high blood pressure. You may need to change some of your habits. °Lifestyle changes may include: °· Following the DASH diet. This diet is high in fruits, vegetables, and whole grains. It is low in salt, red meat, and added sugars. °· Getting at least 2½ hours of brisk physical activity every week. °· Losing weight if necessary. °· Not smoking. °· Limiting alcoholic beverages. °· Learning ways to reduce stress. ° If lifestyle changes are not enough to get your blood pressure under control, your health care provider may prescribe medicine. You may need to take more than one. Work closely with your health care provider to understand the risks and benefits. °HOME CARE INSTRUCTIONS °· Have your blood pressure rechecked as directed by your health care provider.   °· Take medicines only as directed by your health care provider. Follow the directions carefully. Blood pressure medicines must be taken as prescribed. The medicine does not work as well when you skip doses. Skipping doses also puts you at risk for problems.   °·   Do not smoke.   °· Monitor your blood pressure at home as directed by your health care provider.  °SEEK MEDICAL CARE IF:  °· You think you are having a reaction to medicines taken. °· You have recurrent headaches or feel dizzy. °· You have swelling in your ankles. °· You have trouble with your vision. °SEEK IMMEDIATE MEDICAL CARE IF: °· You develop a severe headache or confusion. °· You have unusual weakness, numbness, or feel faint. °· You have severe chest or abdominal pain. °· You vomit repeatedly. °· You have trouble breathing. °MAKE SURE YOU:  °· Understand these instructions. °· Will watch your condition. °· Will get help right away if you are not doing well or get worse. °Document Released: 02/12/2005 Document Revised: 06/29/2013 Document Reviewed:  12/05/2012 °ExitCare® Patient Information ©2015 [employer]. This information is not intended to replace advice given to you by your health care provider. Make sure you discuss any questions you have with your health care provider. ° °

## 2014-06-18 ENCOUNTER — Encounter: Payer: Self-pay | Admitting: Internal Medicine

## 2014-08-23 ENCOUNTER — Other Ambulatory Visit: Payer: Self-pay

## 2015-03-20 DIAGNOSIS — R600 Localized edema: Secondary | ICD-10-CM | POA: Insufficient documentation

## 2015-03-30 DIAGNOSIS — E119 Type 2 diabetes mellitus without complications: Secondary | ICD-10-CM

## 2015-03-30 HISTORY — DX: Type 2 diabetes mellitus without complications: E11.9

## 2015-04-26 ENCOUNTER — Other Ambulatory Visit (INDEPENDENT_AMBULATORY_CARE_PROVIDER_SITE_OTHER): Payer: Medicare Other

## 2015-04-26 ENCOUNTER — Encounter: Payer: Self-pay | Admitting: Internal Medicine

## 2015-04-26 ENCOUNTER — Ambulatory Visit (INDEPENDENT_AMBULATORY_CARE_PROVIDER_SITE_OTHER): Payer: Medicare Other | Admitting: Internal Medicine

## 2015-04-26 VITALS — BP 154/86 | HR 88 | Temp 97.7°F | Resp 16 | Wt 226.0 lb

## 2015-04-26 DIAGNOSIS — Z8639 Personal history of other endocrine, nutritional and metabolic disease: Secondary | ICD-10-CM | POA: Diagnosis not present

## 2015-04-26 DIAGNOSIS — Z8739 Personal history of other diseases of the musculoskeletal system and connective tissue: Secondary | ICD-10-CM

## 2015-04-26 DIAGNOSIS — R600 Localized edema: Secondary | ICD-10-CM | POA: Diagnosis not present

## 2015-04-26 DIAGNOSIS — Z8673 Personal history of transient ischemic attack (TIA), and cerebral infarction without residual deficits: Secondary | ICD-10-CM | POA: Diagnosis not present

## 2015-04-26 DIAGNOSIS — K219 Gastro-esophageal reflux disease without esophagitis: Secondary | ICD-10-CM | POA: Insufficient documentation

## 2015-04-26 DIAGNOSIS — N189 Chronic kidney disease, unspecified: Secondary | ICD-10-CM | POA: Diagnosis not present

## 2015-04-26 DIAGNOSIS — R0683 Snoring: Secondary | ICD-10-CM

## 2015-04-26 DIAGNOSIS — R059 Cough, unspecified: Secondary | ICD-10-CM

## 2015-04-26 DIAGNOSIS — I10 Essential (primary) hypertension: Secondary | ICD-10-CM | POA: Diagnosis not present

## 2015-04-26 DIAGNOSIS — I152 Hypertension secondary to endocrine disorders: Secondary | ICD-10-CM | POA: Insufficient documentation

## 2015-04-26 DIAGNOSIS — M109 Gout, unspecified: Secondary | ICD-10-CM | POA: Insufficient documentation

## 2015-04-26 DIAGNOSIS — R1310 Dysphagia, unspecified: Secondary | ICD-10-CM | POA: Insufficient documentation

## 2015-04-26 DIAGNOSIS — R05 Cough: Secondary | ICD-10-CM

## 2015-04-26 DIAGNOSIS — E1159 Type 2 diabetes mellitus with other circulatory complications: Secondary | ICD-10-CM | POA: Insufficient documentation

## 2015-04-26 HISTORY — DX: Snoring: R06.83

## 2015-04-26 HISTORY — DX: Chronic kidney disease, unspecified: N18.9

## 2015-04-26 HISTORY — DX: Personal history of other diseases of the musculoskeletal system and connective tissue: Z87.39

## 2015-04-26 HISTORY — DX: Dysphagia, unspecified: R13.10

## 2015-04-26 HISTORY — DX: Personal history of transient ischemic attack (TIA), and cerebral infarction without residual deficits: Z86.73

## 2015-04-26 HISTORY — DX: Cough, unspecified: R05.9

## 2015-04-26 LAB — COMPREHENSIVE METABOLIC PANEL
ALT: 15 U/L (ref 0–35)
AST: 16 U/L (ref 0–37)
Albumin: 4.7 g/dL (ref 3.5–5.2)
Alkaline Phosphatase: 118 U/L — ABNORMAL HIGH (ref 39–117)
BUN: 21 mg/dL (ref 6–23)
CO2: 27 mEq/L (ref 19–32)
Calcium: 9.3 mg/dL (ref 8.4–10.5)
Chloride: 100 mEq/L (ref 96–112)
Creatinine, Ser: 1.31 mg/dL — ABNORMAL HIGH (ref 0.40–1.20)
GFR: 42.65 mL/min — ABNORMAL LOW (ref >60.00)
Glucose, Bld: 123 mg/dL — ABNORMAL HIGH (ref 70–99)
Potassium: 3.8 mEq/L (ref 3.5–5.1)
Sodium: 136 mEq/L (ref 135–145)
Total Bilirubin: 0.4 mg/dL (ref 0.2–1.2)
Total Protein: 7.9 g/dL (ref 6.0–8.3)

## 2015-04-26 LAB — TSH: TSH: 4.04 u[IU]/mL (ref 0.35–4.50)

## 2015-04-26 LAB — CBC WITH DIFFERENTIAL/PLATELET
Basophils Absolute: 0 10*3/uL (ref 0.0–0.1)
Basophils Relative: 0.2 % (ref 0.0–3.0)
Eosinophils Absolute: 0.4 10*3/uL (ref 0.0–0.7)
Eosinophils Relative: 3 % (ref 0.0–5.0)
HCT: 40.4 % (ref 36.0–46.0)
Hemoglobin: 13.4 g/dL (ref 12.0–15.0)
Lymphocytes Relative: 26.1 % (ref 12.0–46.0)
Lymphs Abs: 3.5 10*3/uL (ref 0.7–4.0)
MCHC: 33.3 g/dL (ref 30.0–36.0)
MCV: 90.4 fl (ref 78.0–100.0)
Monocytes Absolute: 0.8 10*3/uL (ref 0.1–1.0)
Monocytes Relative: 6.2 % (ref 3.0–12.0)
Neutro Abs: 8.7 10*3/uL — ABNORMAL HIGH (ref 1.4–7.7)
Neutrophils Relative %: 64.5 % (ref 43.0–77.0)
Platelets: 361 10*3/uL (ref 150.0–400.0)
RBC: 4.47 Mil/uL (ref 3.87–5.11)
RDW: 14.1 % (ref 11.5–15.5)
WBC: 13.5 10*3/uL — ABNORMAL HIGH (ref 4.0–10.5)

## 2015-04-26 LAB — BRAIN NATRIURETIC PEPTIDE: Pro B Natriuretic peptide (BNP): 20 pg/mL (ref 0.0–100.0)

## 2015-04-26 LAB — HEMOGLOBIN A1C: Hgb A1c MFr Bld: 7.3 % — ABNORMAL HIGH (ref 4.6–6.5)

## 2015-04-26 LAB — T4, FREE: Free T4: 0.99 ng/dL (ref 0.60–1.60)

## 2015-04-26 NOTE — Assessment & Plan Note (Signed)
2011-she states it was a series of multiple small strokes Taking Plavix daily Blood pressure elevated here today-stressed the importance of getting her blood pressure better controlled Taking gemfibrozil Encouraged regular exercise and weight loss

## 2015-04-26 NOTE — Assessment & Plan Note (Signed)
She feels her GERD is well controlled ? Control given the cough and dysphagia Continue omeprazole 20 mg daily for now

## 2015-04-26 NOTE — Assessment & Plan Note (Signed)
She is a sleep study several years ago and was found only to have restless leg syndrome, not sleep apnea Since that time she has gained significant weight She snores, has witnessed apnea, has daytime fatigue and gasps for air at night She likely has sleep apnea and I have encouraged her to go for another sleep study-she would like to defer for now since she has some many things going on Discussed potential consequences of untreated sleep apnea

## 2015-04-26 NOTE — Progress Notes (Signed)
Subjective:    Patient ID: Kerry Powell, female    DOB: 1946/01/18, 70 y.o.   MRN: [id number]  HPI She is here to establish with a new primary care physician. She is here for follow-up and has many questions.  Ckd:  She was told in her past that she had stage III kidney disease, but her urologist told her after having blood work and everything was normal. 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 She wonders if she has kidney disease and how bad it is.  Continuous headaches: She has been having constant headaches. She has depression and anxiety due to the pain.  Saw neuro and then saw pain management.  Injections did not help.  Neuro is going to send her to [facility] for a nerve ablation.  His thought to be a pinched nerve at C2. The pain is from the neck and radiates up the head to the eye and around her cheek to the chin.  The pain is burning and very painful.  She was taken off the gabapentin and place on a different medication that worked for a few days only - the diclofenac.  She feels the pain causes anxiety and depression, as well as chronic fatigue, but she does not feel that she has these problems outside of the chronic head pain.    Depression, anxiety: She is taking Cymbalta daily as prescribed. She feels some depression and anxiety related to her chronic headaches, but denies any depression or anxiety outside of that.   CVA in 2011:  She had several small strokes.  She had paralysis on the right side, drooping of the face, jerking of left leg, and memory problems.  The left leg feels weird even now and sometimes she does have some jerking movements.    Leg and feet swellling:  She takes lasix every three days.  The leg swelling is not controlled and she is concerned about that. She is currently not exercising regularly. She feels she is compliant with a low sodium diet.  Hoarse, coughing:  She gets hoarse and coughs when she starts talking.   She has gained weight, especially since her stroke. She wants to have her tsh checked.  Her tsh was checked a couple of months ago and was normal.  It feels like something is three never throat when she swallows.  She does not wear her dentures - not comfortable, so she needs to eats soft foods.  She does have heartburn, but feels it is controlled.  Hypertension: She is taking her medication daily. She is compliant with a low sodium diet.  She denies chest pain. She does experience shortness of breath when she bends over only, but not walking on flat surface. She has chronic leg edema. She has palpitations when she lays down at night only.. She is not exercising regularly.  She does monitor her blood pressure at home.    GERD:  She is taking her medication daily as prescribed.  She denies any GERD symptoms and feels her GERD is well controlled. May feel it once a week depending on what she eats.   She follows with urology, neurology, pain management.   She sometimes can not catch her breath at night.  She snores.  She has witnessed apneas.  She was tested for sleep apnea prior to 2011 and was only told she had 2011.  She was told she had RLS.  She has gained  weight since her last sleep apnea test.   She drinks cranberry juice and coke.  She typically drinks on average 3 Cokes a day. She does not drink much water.     Medications and allergies reviewed with patient and updated if appropriate.  Patient Active Problem List   Diagnosis Date Noted  . History of CVA (cerebrovascular accident) 04/26/2015  . History of gout 04/26/2015  . CKD (chronic kidney disease) 04/26/2015  . Essential hypertension, benign 04/26/2015  . POSTNASAL DRIP SYNDROME 12/29/2007  . Personal history of colonic adenoma 05/29/2007  . HIATAL HERNIA 04/07/2007  . RESTLESS LEG SYNDROME 04/04/2007  . 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 COPD 04/04/2007  . IRRITABLE BOWEL SYNDROME 04/04/2007  . OSTEOARTHRITIS 04/04/2007    . BACK PAIN, CHRONIC 04/04/2007    Current Outpatient Prescriptions on File Prior to Visit  Medication Sig Dispense Refill  . buPROPion (WELLBUTRIN SR) 150 MG 12 hr tablet Take 150 mg by mouth 2 (two) times daily.    . clopidogrel (PLAVIX) 75 MG tablet Take 75 mg by mouth daily.      [name] gemfibrozil (LOPID) 600 MG tablet Take 600 mg by mouth 2 (two) times daily before a meal.    . omeprazole (PRILOSEC) 20 MG capsule Take 20 mg by mouth daily.     No current facility-administered medications on file prior to visit.    Past Medical History  Diagnosis Date  . Stroke [facility])     x2 2011  . TIA (transient ischemic attack)   . Hypertension   . Hyperlipidemia   . GERD (gastroesophageal reflux disease)   . GASTRIC ULCER, ACUTE 04/07/2007    Annotation: EGD 04/07/07 Qualifier: Diagnosis of  By: Carlean Purl MD, Dimas Millin Personal history of colonic adenoma 06/07/2012  . Kidney tumor (benign)   . COPD (chronic obstructive pulmonary disease) [facility])     Past Surgical History  Procedure Laterality Date  . Abdominal hysterectomy    . Kidney surgery      benign tumor removal  . Rectocele repair    . Cystocele repair    . Cataract extraction    . Orthopedic surgery      Wrist & elbow  . Esophagogastroduodenoscopy    . Colonoscopy      Social History   Social History  . Marital Status: Married    Spouse Name: N/A  . Number of Children: N/A  . Years of Education: N/A   Social History Main Topics  . Smoking status: Former Research scientist (life sciences)  . Smokeless tobacco: None  . Alcohol Use: No  . Drug Use: No  . Sexual Activity: Not Asked   Other Topics Concern  . None   Social History Narrative    Family History  Problem Relation Age of Onset  . Congestive Heart Failure Mother   . Cancer Mother     ovarian  . Heart disease Father   . Diabetes Father   . Kidney disease Father   . Diabetes Sister   . Kidney disease Sister     Review of Systems  Constitutional: Positive for fatigue.  Negative for fever and chills.  HENT: Negative for trouble swallowing.        Dry mouth  Eyes: Negative for visual disturbance.  Respiratory: Positive for apnea and shortness of breath (mostly with bending over). Negative for cough and wheezing.   Cardiovascular: Positive for palpitations (at night when laying down) and leg swelling (controlled with laying down). Negative for chest  pain.  Gastrointestinal: Negative for abdominal pain, diarrhea, constipation and blood in stool.       GERD once a week, on medication  Endocrine: Negative for polydipsia and polyuria.  Musculoskeletal: Positive for arthralgias (hands).  Neurological: Positive for dizziness, light-headedness (mild) and headaches.  Psychiatric/Behavioral: Negative for dysphoric mood. The patient is not nervous/anxious.        Objective:   Filed Vitals:   04/26/15 1314  BP: 154/86  Pulse: 88  Temp: 97.7 F (36.5 C)  Resp: 16   Filed Weights   04/26/15 1314  Weight: 226 lb (102.513 kg)   Body mass index is 37.02 kg/(m^2).   Physical Exam Constitutional: She appears well-developed and well-nourished. No distress.  HENT:  Head: Normocephalic and atraumatic.  Right Ear: External ear normal. Normal ear canal and TM Left Ear: External ear normal.  Normal ear canal and TM Mouth/Throat: Oropharynx is clear and moist.  Normal bilateral ear canals and tympanic membranes  Eyes: Conjunctivae and EOM are normal.  Neck: Neck supple. No tracheal deviation present. No thyromegaly present.  No carotid bruit  Cardiovascular: Normal rate, regular rhythm and normal heart sounds.   No murmur heard.  2+ pitting edema. Pulmonary/Chest: Effort normal and breath sounds normal. No respiratory distress. She has no wheezes. She has no rales.  Abdominal: Soft. She exhibits no distension. There is no tenderness.  Lymphadenopathy: She has no cervical adenopathy.  Skin: Skin is warm and dry. She is not diaphoretic.  Psychiatric: She has a  normal mood and affect. Her behavior is normal.       Assessment & Plan:   See Problem List for Assessment and Plan of chronic medical problems.  Her husband will be having back surgery this week and she does not drive so she will not be able to  Advised her to follow-up when she is able, but we will communicate through my chart for now

## 2015-04-26 NOTE — Assessment & Plan Note (Signed)
No episodes on current dose of allopurinol Continue 300 mg daily

## 2015-04-26 NOTE — Patient Instructions (Addendum)
  We have reviewed your prior records including labs and tests today.  Test(s) ordered today. Your results will be released to [place] (or called to you) after review, usually within 72hours after test completion. If any changes need to be made, you will be notified at that same time.  Medications reviewed and updated.  No changes recommended at this time.   A swallowing evaluation was ordered - you will hear from our office to schedule this.    Work on [employer] coke and juice intake.    Please followup when you can - sometimes in the next couple of months.[name]

## 2015-04-26 NOTE — Assessment & Plan Note (Signed)
Bilateral leg edema Taking Lasix 20 mg every 3 days, which does not control her symptoms Check CMP to see if we can increase the Lasix and better define her questionable kidney disease Edema is likely multifactorial Stressed the importance of low sodium diet, regular exercise and weight loss The amlodipine may be contributing we can consider decreasing the future, but advised her we may need to increase or add a different blood pressure medication

## 2015-04-26 NOTE — Assessment & Plan Note (Signed)
Experiencing dysphagia, hoarseness and coughing with eating Possible uncontrolled GERD Will order a swallow evaluation May need to adjust her medication May need to refer to GI for EGD

## 2015-04-26 NOTE — Assessment & Plan Note (Signed)
Blood pressure elevated here today ? Controlled Advised we may need to increase her medication Advised her to start monitoring her blood pressure regularly at home this evening is controlled or not-she will let me know if it is not controlled -- can consider changing to losartan 100 mg  to valsartan 320 mg May be able to increase furosemide for leg swelling or decrease amlodipine for leg swelling Check blood work first

## 2015-04-26 NOTE — Assessment & Plan Note (Signed)
She was told by her previous doctor that she did have chronic kidney disease, stage III, but her doctor told her she did not have any problems with her blood work and she wonders how her kidneys are Check CMP

## 2015-04-27 ENCOUNTER — Encounter: Payer: Self-pay | Admitting: Internal Medicine

## 2015-04-27 DIAGNOSIS — E119 Type 2 diabetes mellitus without complications: Secondary | ICD-10-CM | POA: Insufficient documentation

## 2015-04-27 DIAGNOSIS — E1122 Type 2 diabetes mellitus with diabetic chronic kidney disease: Secondary | ICD-10-CM | POA: Insufficient documentation

## 2015-04-27 HISTORY — DX: Type 2 diabetes mellitus without complications: E11.9

## 2015-04-30 ENCOUNTER — Encounter: Payer: Self-pay | Admitting: Internal Medicine

## 2015-04-30 DIAGNOSIS — M542 Cervicalgia: Secondary | ICD-10-CM

## 2015-04-30 DIAGNOSIS — G8929 Other chronic pain: Secondary | ICD-10-CM

## 2015-04-30 HISTORY — DX: Other chronic pain: G89.29

## 2015-05-14 ENCOUNTER — Encounter: Payer: Self-pay | Admitting: Internal Medicine

## 2015-05-14 DIAGNOSIS — Z8673 Personal history of transient ischemic attack (TIA), and cerebral infarction without residual deficits: Secondary | ICD-10-CM

## 2015-05-14 DIAGNOSIS — R1310 Dysphagia, unspecified: Secondary | ICD-10-CM

## 2015-05-16 ENCOUNTER — Ambulatory Visit (HOSPITAL_COMMUNITY)
Admission: RE | Admit: 2015-05-16 | Discharge: 2015-05-16 | Disposition: A | Discharge status: Home / Self Care | Payer: Medicare Other | Source: Ambulatory Visit | Attending: Urology | Admitting: Urology

## 2015-05-16 ENCOUNTER — Other Ambulatory Visit (HOSPITAL_COMMUNITY): Payer: Self-pay | Admitting: Urology

## 2015-05-16 DIAGNOSIS — C641 Malignant neoplasm of right kidney, except renal pelvis: Secondary | ICD-10-CM | POA: Insufficient documentation

## 2015-05-16 DIAGNOSIS — R918 Other nonspecific abnormal finding of lung field: Secondary | ICD-10-CM | POA: Insufficient documentation

## 2015-05-17 NOTE — Addendum Note (Signed)
Addended by: Binnie Rail on: 05/17/2015 08:44 PM   Modules accepted: Orders

## 2015-05-31 ENCOUNTER — Other Ambulatory Visit (HOSPITAL_COMMUNITY): Payer: Self-pay | Admitting: Internal Medicine

## 2015-05-31 DIAGNOSIS — R1310 Dysphagia, unspecified: Secondary | ICD-10-CM

## 2015-06-09 ENCOUNTER — Ambulatory Visit (HOSPITAL_COMMUNITY)
Admission: RE | Admit: 2015-06-09 | Discharge: 2015-06-09 | Disposition: A | Discharge status: Home / Self Care | Payer: Medicare Other | Source: Ambulatory Visit | Attending: Internal Medicine | Admitting: Internal Medicine

## 2015-06-09 DIAGNOSIS — R1310 Dysphagia, unspecified: Secondary | ICD-10-CM | POA: Diagnosis not present

## 2015-07-26 ENCOUNTER — Encounter: Payer: Self-pay | Admitting: Internal Medicine

## 2015-07-26 ENCOUNTER — Ambulatory Visit (INDEPENDENT_AMBULATORY_CARE_PROVIDER_SITE_OTHER): Payer: Medicare Other | Admitting: Internal Medicine

## 2015-07-26 VITALS — BP 142/86 | HR 89 | Temp 97.6°F | Resp 16 | Wt 228.0 lb

## 2015-07-26 DIAGNOSIS — R1310 Dysphagia, unspecified: Secondary | ICD-10-CM | POA: Diagnosis not present

## 2015-07-26 DIAGNOSIS — E119 Type 2 diabetes mellitus without complications: Secondary | ICD-10-CM | POA: Diagnosis not present

## 2015-07-26 DIAGNOSIS — R600 Localized edema: Secondary | ICD-10-CM

## 2015-07-26 DIAGNOSIS — I10 Essential (primary) hypertension: Secondary | ICD-10-CM

## 2015-07-26 DIAGNOSIS — M542 Cervicalgia: Secondary | ICD-10-CM

## 2015-07-26 DIAGNOSIS — G8929 Other chronic pain: Secondary | ICD-10-CM

## 2015-07-26 DIAGNOSIS — N189 Chronic kidney disease, unspecified: Secondary | ICD-10-CM

## 2015-07-26 DIAGNOSIS — F419 Anxiety disorder, unspecified: Secondary | ICD-10-CM | POA: Insufficient documentation

## 2015-07-26 MED ORDER — METOPROLOL SUCCINATE ER 25 MG PO TB24: 25.0000 mg | ORAL_TABLET | Freq: Every day | ORAL | Status: DC

## 2015-07-26 MED ORDER — CENTRUM SILVER 50+WOMEN PO TABS: 1.0000 | ORAL_TABLET | Freq: Every day | ORAL | Status: DC

## 2015-07-26 MED ORDER — AMLODIPINE BESYLATE 5 MG PO TABS: 5.0000 mg | ORAL_TABLET | Freq: Every day | ORAL | Status: DC

## 2015-07-26 MED ORDER — PANTOPRAZOLE SODIUM 40 MG PO TBEC: 40.0000 mg | DELAYED_RELEASE_TABLET | Freq: Every day | ORAL | Status: DC

## 2015-07-26 MED ORDER — ROSUVASTATIN CALCIUM 10 MG PO TABS: 10.0000 mg | ORAL_TABLET | Freq: Every day | ORAL | Status: DC

## 2015-07-26 MED ORDER — AMITRIPTYLINE HCL 25 MG PO TABS: 25.0000 mg | ORAL_TABLET | Freq: Every day | ORAL | Status: DC

## 2015-07-26 MED ORDER — TRAMADOL HCL 50 MG PO TABS: 50.0000 mg | ORAL_TABLET | Freq: Three times a day (TID) | ORAL | Status: DC | PRN

## 2015-07-26 NOTE — Patient Instructions (Signed)
  Test(s) ordered today. Your results will be released to [place] (or called to you) after review, usually within 72hours after test completion. If any changes need to be made, you will be notified at that same time.   Medications reviewed and updated.  Changes include decreasing the amlodipine to 5mg  and starting metoprolol for your blood pressure.  Decreasing cymbalta to 30 mg daily and starting amitriptyline 25 mg at night.   Your prescription(s) have been submitted to your pharmacy. Please take as directed and contact our office if you believe you are having problem(s) with the medication(s).  A referral was ordered for GI and neuro.   Please followup in 2 weeks

## 2015-07-26 NOTE — Assessment & Plan Note (Signed)
Recheck A1c at her next visit Stressed regular exercise and weight loss

## 2015-07-26 NOTE — Assessment & Plan Note (Signed)
Swallow evaluation complete-within normal limits Will refer to GI for possible EGD

## 2015-07-26 NOTE — Progress Notes (Signed)
Pre visit review using our clinic review tool, if applicable. No additional management support is needed unless otherwise documented below in the visit note. 

## 2015-07-26 NOTE — Assessment & Plan Note (Signed)
Taking Cymbalta 60 mg daily-we will decrease to 30 mg daily to start amitriptyline to see if that helps with her occipital neuralgia Follow-up next week

## 2015-07-26 NOTE — Assessment & Plan Note (Signed)
Blood pressure elevated here today and should be lower given her chronic kidney disease Amlodipine is likely causing the swelling-we will try decreasing this to 5 mg daily Continue losartan 100 mg daily Start metoprolol 25 mg daily Follow-up next week to recheck blood pressure Blood work at her next visit

## 2015-07-26 NOTE — Progress Notes (Signed)
Subjective:    Patient ID: Kerry Powell, female    DOB: 1945-08-27, 70 y.o.   MRN: [id number]  HPI She is here for follow up.  Leg swelling:  She has been having leg swelling over the past year and it has been getting worse.  She had a cold recently and stopped her normal medication and she did not have any swelling.  She feels the swelling is related to her medications. She does take the furosemide every 2-3 days and she is unsure if that helps.  Hypertension: She is taking her medication daily. She is somewhat compliant with a low sodium diet.  She denies chest pain, palpitations, and regular headaches. She is not exercising regularly.  She does monitor her blood pressure at home on occasion.    Chronic neck pain, headaches: She has been suffering with chronic neck pain and headaches for years. She has seen neurology, neurosurgery, orthopedics and pain management. They'll have different opinions regarding treatment and she would prefer to avoid surgery and nerve blocks. She is taking Cymbalta diclofenac and he did not seem to be helping. She does take tramadol, but that does not seem to provide much relief as well. She has tried gabapentin in the past, but it did not help. She would like to have some relief of the pain. She does not feel like she has any quality of life because of the severe, constant pain.  Anxiety: She is taking 60 mg of the Cymbalta daily. She still feels anxious at times.  Dysphagia: She did have a swallowing evaluation and was told this was normal. She would like to have this evaluated further because she still has some difficulty swallowing. She feels this started after she was intubated.  Medications and allergies reviewed with patient and updated if appropriate.  Patient Active Problem List   Diagnosis Date Noted  . Chronic neck pain 04/30/2015  . Diabetes ([place]) 04/27/2015  . History of CVA (cerebrovascular accident) 04/26/2015  . History of gout 04/26/2015    . CKD (chronic kidney disease) 04/26/2015  . Essential hypertension, benign 04/26/2015  . Snoring 04/26/2015  . Localized edema 04/26/2015  . Dysphagia 04/26/2015  . Cough 04/26/2015  . GERD (gastroesophageal reflux disease) 04/26/2015  . Personal history of colonic adenoma 05/29/2007  . HIATAL HERNIA 04/07/2007  . RESTLESS LEG SYNDROME 04/04/2007  . 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 COPD 04/04/2007  . IRRITABLE BOWEL SYNDROME 04/04/2007  . OSTEOARTHRITIS 04/04/2007  . BACK PAIN, CHRONIC 04/04/2007    Current Outpatient Prescriptions on File Prior to Visit  Medication Sig Dispense Refill  . ACETAMINOPHEN-BUTALBITAL 50-325 MG TABS Take by mouth every 6 (six) hours.    [name] allopurinol (ZYLOPRIM) 300 MG tablet Take 300 mg by mouth daily.    [name] AMLODIPINE BESYLATE PO Take 10 mg by mouth daily.    [name] buPROPion (WELLBUTRIN SR) 150 MG 12 hr tablet Take 150 mg by mouth 2 (two) times daily.    . clopidogrel (PLAVIX) 75 MG tablet Take 75 mg by mouth daily.      . diclofenac (CATAFLAM) 50 MG tablet Take 50 mg by mouth.    . DULoxetine (CYMBALTA) 30 MG capsule Take 30 mg by mouth daily.    . furosemide (LASIX) 20 MG tablet Take 20 mg by mouth every 3 (three) days.    [name] gemfibrozil (LOPID) 600 MG tablet Take 600 mg by mouth 2 (two) times daily before a meal.    . losartan (COZAAR) 100  MG tablet Take 100 mg by mouth daily.    . Pantothenic Acid 500 MG TABS Take by mouth.     No current facility-administered medications on file prior to visit.    Past Medical History  Diagnosis Date  . Stroke [facility])     x2 2011  . TIA (transient ischemic attack)   . Hypertension   . Hyperlipidemia   . GERD (gastroesophageal reflux disease)   . GASTRIC ULCER, ACUTE 04/07/2007    Annotation: EGD 04/07/07 Qualifier: Diagnosis of  By: Carlean Purl MD, Dimas Millin Personal history of colonic adenoma 06/07/2012  . Kidney tumor (benign)   . COPD (chronic obstructive pulmonary disease) [facility])     Past Surgical  History  Procedure Laterality Date  . Abdominal hysterectomy    . Kidney surgery      benign tumor removal  . Rectocele repair    . Cystocele repair    . Cataract extraction    . Orthopedic surgery      Wrist & elbow  . Esophagogastroduodenoscopy    . Colonoscopy      Social History   Social History  . Marital Status: Married    Spouse Name: N/A  . Number of Children: N/A  . Years of Education: N/A   Social History Main Topics  . Smoking status: Former Research scientist (life sciences)  . Smokeless tobacco: Not on file  . Alcohol Use: No  . Drug Use: No  . Sexual Activity: Not on file   Other Topics Concern  . Not on file   Social History Narrative    Family History  Problem Relation Age of Onset  . Congestive Heart Failure Mother   . Cancer Mother     ovarian  . Heart disease Father   . Diabetes Father   . Kidney disease Father   . Diabetes Sister   . Kidney disease Sister     Review of Systems  Constitutional: Negative for fever.  HENT: Positive for trouble swallowing.   Respiratory: Positive for shortness of breath (better) and wheezing (when sob). Negative for cough.   Cardiovascular: Positive for leg swelling. Negative for chest pain and palpitations.  Neurological: Positive for headaches. Negative for dizziness and light-headedness.       Objective:   Filed Vitals:   07/26/15 1349  BP: 142/86  Pulse: 89  Temp: 97.6 F (36.4 C)  Resp: 16   Filed Weights   07/26/15 1349  Weight: 228 lb (103.42 kg)   Body mass index is 37.35 kg/(m^2).   Physical Exam Constitutional: Appears well-developed and well-nourished. No distress.  Neck: Neck supple. No tracheal deviation present. No thyromegaly present.  No carotid bruit. No cervical adenopathy.   Cardiovascular: Normal rate, regular rhythm and normal heart sounds.   No murmur heard.  No edema Pulmonary/Chest: Effort normal and breath sounds normal. No respiratory distress. No wheezes.       Assessment & Plan:     See Problem List for Assessment and Plan of chronic medical problems.

## 2015-07-26 NOTE — Assessment & Plan Note (Addendum)
Stressed better blood pressure control She is currently taking diclofenac and discussed with her that ideally she should not take this due to her chronic kidney disease, especially if she does not feel it is helping Check CMP at her next visit

## 2015-07-26 NOTE — Assessment & Plan Note (Signed)
We'll decrease amlodipine to 5 mg daily Continue furosemide every 2-3 days Increase activity and work on weight loss

## 2015-07-26 NOTE — Assessment & Plan Note (Signed)
Associated with headaches-occipital neuralgia We'll refer to neurology for their opinion regarding treatment Has seen pain management, orthopedics and neurosurgery-all have different suggestions and she would prefer not to do an intervention or surgery Gabapentin, tramadol and neck brace were not effective only provided temporary relief Trial of amitriptyline at bedtime-we will need to decrease Cymbalta dose

## 2015-07-27 ENCOUNTER — Telehealth: Payer: Self-pay | Admitting: Internal Medicine

## 2015-07-27 DIAGNOSIS — M5481 Occipital neuralgia: Secondary | ICD-10-CM

## 2015-07-27 NOTE — Telephone Encounter (Signed)
Please attempt PA -- for occipital neuralgia.  Has not tolerated gabapentin.

## 2015-07-27 NOTE — Telephone Encounter (Signed)
Patient states that insurance will not cover amitriptyline

## 2015-07-27 NOTE — Telephone Encounter (Signed)
Would you like a PA done or to change Med. Please advise.

## 2015-07-28 NOTE — Telephone Encounter (Signed)
PA initiated via CoverMyMeds VEM[employer]Y

## 2015-07-29 NOTE — Telephone Encounter (Signed)
APPROVED 04/30/2015 - 07/28/2016. Pt advised

## 2015-08-05 ENCOUNTER — Other Ambulatory Visit (INDEPENDENT_AMBULATORY_CARE_PROVIDER_SITE_OTHER): Payer: Medicare Other

## 2015-08-05 ENCOUNTER — Encounter: Payer: Self-pay | Admitting: Internal Medicine

## 2015-08-05 DIAGNOSIS — E119 Type 2 diabetes mellitus without complications: Secondary | ICD-10-CM

## 2015-08-05 DIAGNOSIS — I10 Essential (primary) hypertension: Secondary | ICD-10-CM | POA: Diagnosis not present

## 2015-08-05 DIAGNOSIS — R1310 Dysphagia, unspecified: Secondary | ICD-10-CM | POA: Diagnosis not present

## 2015-08-05 DIAGNOSIS — N189 Chronic kidney disease, unspecified: Secondary | ICD-10-CM

## 2015-08-05 LAB — CBC WITH DIFFERENTIAL/PLATELET
Basophils Absolute: 0 10*3/uL (ref 0.0–0.1)
Basophils Relative: 0.4 % (ref 0.0–3.0)
Eosinophils Absolute: 0.6 10*3/uL (ref 0.0–0.7)
Eosinophils Relative: 5.5 % — ABNORMAL HIGH (ref 0.0–5.0)
HCT: 41 % (ref 36.0–46.0)
Hemoglobin: 13.5 g/dL (ref 12.0–15.0)
Lymphocytes Relative: 31 % (ref 12.0–46.0)
Lymphs Abs: 3.3 10*3/uL (ref 0.7–4.0)
MCHC: 33 g/dL (ref 30.0–36.0)
MCV: 90.9 fl (ref 78.0–100.0)
Monocytes Absolute: 0.8 10*3/uL (ref 0.1–1.0)
Monocytes Relative: 7.3 % (ref 3.0–12.0)
Neutro Abs: 6 10*3/uL (ref 1.4–7.7)
Neutrophils Relative %: 55.8 % (ref 43.0–77.0)
Platelets: 320 10*3/uL (ref 150.0–400.0)
RBC: 4.51 Mil/uL (ref 3.87–5.11)
RDW: 13.9 % (ref 11.5–15.5)
WBC: 10.7 10*3/uL — ABNORMAL HIGH (ref 4.0–10.5)

## 2015-08-05 LAB — LIPID PANEL
Cholesterol: 158 mg/dL (ref 0–200)
HDL: 33.8 mg/dL — ABNORMAL LOW (ref >39.00)
NonHDL: 124.3
Total CHOL/HDL Ratio: 5
Triglycerides: 224 mg/dL — ABNORMAL HIGH (ref 0.0–149.0)
VLDL: 44.8 mg/dL — ABNORMAL HIGH (ref 0.0–40.0)

## 2015-08-05 LAB — COMPREHENSIVE METABOLIC PANEL
ALT: 14 U/L (ref 0–35)
AST: 14 U/L (ref 0–37)
Albumin: 4.1 g/dL (ref 3.5–5.2)
Alkaline Phosphatase: 126 U/L — ABNORMAL HIGH (ref 39–117)
BUN: 21 mg/dL (ref 6–23)
CO2: 28 mEq/L (ref 19–32)
Calcium: 9 mg/dL (ref 8.4–10.5)
Chloride: 104 mEq/L (ref 96–112)
Creatinine, Ser: 1.26 mg/dL — ABNORMAL HIGH (ref 0.40–1.20)
GFR: 44.57 mL/min — ABNORMAL LOW (ref >60.00)
Glucose, Bld: 139 mg/dL — ABNORMAL HIGH (ref 70–99)
Potassium: 3.9 mEq/L (ref 3.5–5.1)
Sodium: 140 mEq/L (ref 135–145)
Total Bilirubin: 0.3 mg/dL (ref 0.2–1.2)
Total Protein: 7.2 g/dL (ref 6.0–8.3)

## 2015-08-05 LAB — LDL CHOLESTEROL, DIRECT: Direct LDL: 97 mg/dL

## 2015-08-05 LAB — HEMOGLOBIN A1C: Hgb A1c MFr Bld: 7 % — ABNORMAL HIGH (ref 4.6–6.5)

## 2015-08-05 LAB — TSH: TSH: 6.42 u[IU]/mL — ABNORMAL HIGH (ref 0.35–4.50)

## 2015-08-09 ENCOUNTER — Ambulatory Visit (INDEPENDENT_AMBULATORY_CARE_PROVIDER_SITE_OTHER): Payer: Medicare Other | Admitting: Internal Medicine

## 2015-08-09 ENCOUNTER — Encounter: Payer: Self-pay | Admitting: Internal Medicine

## 2015-08-09 VITALS — BP 130/84 | HR 84 | Temp 97.6°F | Resp 18 | Wt 223.0 lb

## 2015-08-09 DIAGNOSIS — I10 Essential (primary) hypertension: Secondary | ICD-10-CM

## 2015-08-09 DIAGNOSIS — G8929 Other chronic pain: Secondary | ICD-10-CM

## 2015-08-09 DIAGNOSIS — R7989 Other specified abnormal findings of blood chemistry: Secondary | ICD-10-CM

## 2015-08-09 DIAGNOSIS — M542 Cervicalgia: Secondary | ICD-10-CM

## 2015-08-09 DIAGNOSIS — F419 Anxiety disorder, unspecified: Secondary | ICD-10-CM | POA: Diagnosis not present

## 2015-08-09 DIAGNOSIS — N189 Chronic kidney disease, unspecified: Secondary | ICD-10-CM

## 2015-08-09 DIAGNOSIS — E119 Type 2 diabetes mellitus without complications: Secondary | ICD-10-CM

## 2015-08-09 DIAGNOSIS — R946 Abnormal results of thyroid function studies: Secondary | ICD-10-CM | POA: Diagnosis not present

## 2015-08-09 HISTORY — DX: Other specified abnormal findings of blood chemistry: R79.89

## 2015-08-09 MED ORDER — DULOXETINE HCL 60 MG PO CPEP
60.0000 mg | ORAL_CAPSULE | Freq: Every day | ORAL | Status: DC
Start: 2015-08-09 — End: 2015-10-19

## 2015-08-09 MED ORDER — AMITRIPTYLINE HCL 10 MG PO TABS: 10.0000 mg | ORAL_TABLET | Freq: Every day | ORAL | Status: DC

## 2015-08-09 NOTE — Assessment & Plan Note (Signed)
Recent TSH elevated Will recheck TSH, thyroid antibodies and free T4 in approximately 2 months Discussed that she may need medication.

## 2015-08-09 NOTE — Progress Notes (Signed)
Pre visit review using our clinic review tool, if applicable. No additional management support is needed unless otherwise documented below in the visit note. 

## 2015-08-09 NOTE — Assessment & Plan Note (Signed)
Dose of Cymbalta decreased in order to start amitriptyline and she has had increased anxiety. She also thinks that she has not been able to stay awake as well during the day from the amitriptyline and that is increasing her anxiety is well We will increase Cymbalta back 60 titrate as needed

## 2015-08-09 NOTE — Assessment & Plan Note (Signed)
Arthritis of the neck, headaches, occipital neuralgia Improvement with amitriptyline, but the medication is making her too drowsy Decrease amitriptyline dose to 10 mg at bedtime-we will titrate dose as needed

## 2015-08-09 NOTE — Progress Notes (Signed)
Subjective:    Patient ID: Kerry Powell, female    DOB: 1945-06-02, 70 y.o.   MRN: [id number]  HPI She is here for follow-up.  Occipital neuralgia:  She started amitriptyline 25 at night and feels very sleeping when she wakes up in the morning and during the day. He has significantly helped the pain and itching sensation in the back of her head. She was very excited regarding the improvement in that symptom, but is struggling to stay awake during the day. . Anxiety: We did decrease the Cymbalta from 60 mg daily to 30 mg at last visit since we were starting the amitriptyline. She has had increased anxiety.  She thinks her being tired throughout the day may be contributing to increased anxiety is well. She wonders if there is a different medication that she should take to help with some of the anxiety.     Hypertension: She is taking her medication daily-related had metoprolol at her last visit. She is compliant with a low sodium diet.  She denies chest pain, palpitations, edema, shortness of breath and lightheadedness. She is  not exercising regularly, But would like to start.  She does not monitor her blood pressure at home.     Medications and allergies reviewed with patient and updated if appropriate.  Patient Active Problem List   Diagnosis Date Noted  . Anxiety 07/26/2015  . Chronic neck pain 04/30/2015  . Diabetes ([place]) 04/27/2015  . History of CVA (cerebrovascular accident) 04/26/2015  . History of gout 04/26/2015  . CKD (chronic kidney disease) 04/26/2015  . Essential hypertension, benign 04/26/2015  . Snoring 04/26/2015  . Localized edema 04/26/2015  . Dysphagia 04/26/2015  . Cough 04/26/2015  . GERD (gastroesophageal reflux disease) 04/26/2015  . Personal history of colonic adenoma 05/29/2007  . HIATAL HERNIA 04/07/2007  . RESTLESS LEG SYNDROME 04/04/2007  . 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 COPD 04/04/2007  . IRRITABLE BOWEL SYNDROME 04/04/2007  .  OSTEOARTHRITIS 04/04/2007  . BACK PAIN, CHRONIC 04/04/2007    Current Outpatient Prescriptions on File Prior to Visit  Medication Sig Dispense Refill  . ACETAMINOPHEN-BUTALBITAL 50-325 MG TABS Take 50 mg by mouth as needed.     [name] allopurinol (ZYLOPRIM) 300 MG tablet Take 300 mg by mouth daily.    [name] amitriptyline (ELAVIL) 25 MG tablet Take 1 tablet (25 mg total) by mouth at bedtime. 30 tablet 5  . amLODipine (NORVASC) 5 MG tablet Take 1 tablet (5 mg total) by mouth daily. 30 tablet 3  . clopidogrel (PLAVIX) 75 MG tablet Take 75 mg by mouth daily.      . DULoxetine (CYMBALTA) 30 MG capsule Take 30 mg by mouth daily.    . furosemide (LASIX) 20 MG tablet Take 20 mg by mouth every 3 (three) days.    [name] losartan (COZAAR) 100 MG tablet Take 100 mg by mouth daily.    . metoprolol succinate (TOPROL-XL) 25 MG 24 hr tablet Take 1 tablet (25 mg total) by mouth daily. 30 tablet 3  . Multiple Vitamins-Minerals (CENTRUM SILVER 50+WOMEN) TABS Take 1 tablet by mouth daily.    . pantoprazole (PROTONIX) 40 MG tablet Take 1 tablet (40 mg total) by mouth daily. 30 tablet 3  . rosuvastatin (CRESTOR) 10 MG tablet Take 1 tablet (10 mg total) by mouth daily. 90 tablet 3   No current facility-administered medications on file prior to visit.    Past Medical History  Diagnosis Date  . Stroke [facility])  x2 2011  . TIA (transient ischemic attack)   . Hypertension   . Hyperlipidemia   . GERD (gastroesophageal reflux disease)   . GASTRIC ULCER, ACUTE 04/07/2007    Annotation: EGD 04/07/07 Qualifier: Diagnosis of  By: Carlean Purl MD, Dimas Millin Personal history of colonic adenoma 06/07/2012  . Kidney tumor (benign)   . COPD (chronic obstructive pulmonary disease) [facility])     Past Surgical History  Procedure Laterality Date  . Abdominal hysterectomy    . Kidney surgery      benign tumor removal  . Rectocele repair    . Cystocele repair    . Cataract extraction    . Orthopedic surgery      Wrist & elbow  .  Esophagogastroduodenoscopy    . Colonoscopy      Social History   Social History  . Marital Status: Married    Spouse Name: N/A  . Number of Children: N/A  . Years of Education: N/A   Social History Main Topics  . Smoking status: Former Research scientist (life sciences)  . Smokeless tobacco: Not on file  . Alcohol Use: No  . Drug Use: No  . Sexual Activity: Not on file   Other Topics Concern  . Not on file   Social History Narrative    Family History  Problem Relation Age of Onset  . Congestive Heart Failure Mother   . Cancer Mother     ovarian  . Heart disease Father   . Diabetes Father   . Kidney disease Father   . Diabetes Sister   . Kidney disease Sister     Review of Systems  Constitutional: Positive for fatigue.  Respiratory: Negative for shortness of breath.   Cardiovascular: Negative for chest pain, palpitations and leg swelling.  Musculoskeletal: Positive for neck pain and neck stiffness.  Neurological: Positive for headaches (from being tired).       Objective:   Filed Vitals:   08/09/15 1138  BP: 130/84  Pulse: 84  Temp: 97.6 F (36.4 C)  Resp: 18   Filed Weights   08/09/15 1138  Weight: 223 lb (101.152 kg)   Body mass index is 36.53 kg/(m^2).   Physical Exam CFollow-up in 6 monthsonstitutional: Appears well-developed and well-nourished. No distress.  Neck: Neck supple. No tracheal deviation present. No thyromegaly present.  No carotid bruit. No cervical adenopathy.   Cardiovascular: Normal rate, regular rhythm and normal heart sounds.   No murmur heard.  No edema Pulmonary/Chest: Effort normal and breath sounds normal. No respiratory distress. No wheezes.         Assessment & Plan:    See Problem List for Assessment and Plan of chronic medical problems.

## 2015-08-09 NOTE — Assessment & Plan Note (Signed)
A1c slightly improved, 7.0 No need for medication at this time  stressed regular exercise Work on weight loss Diabetic diet

## 2015-08-09 NOTE — Assessment & Plan Note (Signed)
Kidney function stable We will continue to monitor Avoid NSAIDs

## 2015-08-09 NOTE — Patient Instructions (Addendum)
Decrease the amitriptyline to 10 mg at night.   Increase cymbalta back to 60 mg daily.    Have blood work done in 2-3 months to recheck your thyroid blood work.   Follow up in 6 months.

## 2015-08-09 NOTE — Assessment & Plan Note (Signed)
BP well controlled Current regimen effective and well tolerated Continue current medications at current doses  

## 2015-08-10 ENCOUNTER — Encounter: Payer: Self-pay | Admitting: Internal Medicine

## 2015-08-15 NOTE — Telephone Encounter (Signed)
Referral to [facility] Neurology  They do not treat neck pain/   Who referral to?

## 2015-08-15 NOTE — Telephone Encounter (Signed)
It is actually occipital neuralgia.  I will enter a new referral.

## 2015-08-23 ENCOUNTER — Other Ambulatory Visit: Payer: Self-pay | Admitting: Emergency Medicine

## 2015-08-23 MED ORDER — AMLODIPINE BESYLATE 5 MG PO TABS: 5.0000 mg | ORAL_TABLET | Freq: Every day | ORAL | Status: DC

## 2015-08-23 MED ORDER — METOPROLOL SUCCINATE ER 25 MG PO TB24: 25.0000 mg | ORAL_TABLET | Freq: Every day | ORAL | Status: DC

## 2015-09-22 ENCOUNTER — Encounter: Payer: Self-pay | Admitting: Internal Medicine

## 2015-09-24 MED ORDER — BUTALBITAL-ACETAMINOPHEN 50-325 MG PO TABS: 50.0000 mg | ORAL_TABLET | ORAL | 0 refills | Status: DC | PRN

## 2015-09-24 MED ORDER — ALLOPURINOL 300 MG PO TABS: 300.0000 mg | ORAL_TABLET | Freq: Every day | ORAL | 1 refills | Status: DC

## 2015-10-06 ENCOUNTER — Encounter: Payer: Self-pay | Admitting: Internal Medicine

## 2015-10-06 DIAGNOSIS — Z1283 Encounter for screening for malignant neoplasm of skin: Secondary | ICD-10-CM

## 2015-10-19 ENCOUNTER — Encounter: Payer: Self-pay | Admitting: Internal Medicine

## 2015-10-19 ENCOUNTER — Ambulatory Visit (INDEPENDENT_AMBULATORY_CARE_PROVIDER_SITE_OTHER): Payer: Medicare Other | Admitting: Internal Medicine

## 2015-10-19 VITALS — BP 130/80 | HR 80 | Ht 65.0 in | Wt 223.4 lb

## 2015-10-19 DIAGNOSIS — M438X2 Other specified deforming dorsopathies, cervical region: Secondary | ICD-10-CM

## 2015-10-19 DIAGNOSIS — M489 Spondylopathy, unspecified: Secondary | ICD-10-CM

## 2015-10-19 DIAGNOSIS — R499 Unspecified voice and resonance disorder: Secondary | ICD-10-CM | POA: Diagnosis not present

## 2015-10-19 DIAGNOSIS — J387 Other diseases of larynx: Secondary | ICD-10-CM | POA: Diagnosis not present

## 2015-10-19 DIAGNOSIS — K219 Gastro-esophageal reflux disease without esophagitis: Secondary | ICD-10-CM

## 2015-10-19 DIAGNOSIS — R1310 Dysphagia, unspecified: Secondary | ICD-10-CM | POA: Diagnosis not present

## 2015-10-19 MED ORDER — PANTOPRAZOLE SODIUM 40 MG PO TBEC: 40.0000 mg | DELAYED_RELEASE_TABLET | Freq: Two times a day (BID) | ORAL | 1 refills | Status: DC

## 2015-10-19 NOTE — Patient Instructions (Addendum)
You have been scheduled for a Barium Esophogram at [facility]iology (1st floor of the hospital) on 10/26/15 at 10:30 am. Please arrive 15 minutes prior to your appointment for registration. Make certain not to have anything to eat or drink 6 hours prior to your test. If you need to reschedule for any reason, please contact radiology at [phone] to do so. __________________________________________________________________ A barium swallow is an examination that concentrates on views of the esophagus. This tends to be a double contrast exam (barium and two liquids which, when combined, create a gas to distend the wall of the oesophagus) or single contrast (non-ionic iodine based). The study is usually tailored to your symptoms so a good history is essential. Attention is paid during the study to the form, structure and configuration of the esophagus, looking for functional disorders (such as aspiration, dysphagia, achalasia, motility and reflux) EXAMINATION You may be asked to change into a gown, depending on the type of swallow being performed. A radiologist and radiographer will perform the procedure. The radiologist will advise you of the type of contrast selected for your procedure and direct you during the exam. You will be asked to stand, sit or lie in several different positions and to hold a small amount of fluid in your mouth before being asked to swallow while the imaging is performed .In some instances you may be asked to swallow barium coated marshmallows to assess the motility of a solid food bolus. The exam can be recorded as a digital or video fluoroscopy procedure. POST PROCEDURE It will take 1-2 days for the barium to pass through your system. To facilitate this, it is important, unless otherwise directed, to increase your fluids for the next 24-48hrs and to resume your normal diet.  This test typically takes about 30 minutes to  perform. __________________________________________________________________________________  We have sent the following medications to your pharmacy for you to pick up at your convenience: Pantoprazole twice a day.    I appreciate the opportunity to care for you. Silvano Rusk, MD, [facility]

## 2015-10-19 NOTE — Progress Notes (Signed)
Referred by Binnie Rail, MD [place], [place] [id number]  Subjective:    Patient ID: Kerry Powell, female    DOB: 1945/08/09, 70 y.o.   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 Has had worsening problems with dysphagia to solids and liquids "I get strangled". Can awaken at night - often does - strangling on salive and dyspneic, coughing. Has not worn dentures to eat since she got them 3 yrs ago - loose. Eats soft foods Gets strangled and loses breath Raspy voice No ENT evaluation to date  Sxs x years  heartburm ok on daily PPI mostly.  After TEE a few yrs ago awakened during withdrawal and had pain in esophageal area for some time.  Specch path eval 05/2015 Pt presents with functional oropharyngeal swallow ability without aspiration/penetration of any consistency tested.  Minimal delay in oral transiting and premature spillage of thin into pharynx with swallow triggering at pyriform sinus with thin.  Pharyngeal swallow was strong without residuals.   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  Pt scored 38/45 - which per RSI screen indicates high likelihood of LPR.  Provided pt with information regarding LPR given her known h/o reflux.    Of note, pt complains of globus with and without intake.  Thanks for this referral.      Impact on safety and function Mild aspiration risk   Allergies  Allergen Reactions  . Clindamycin Rash  . Sulfa Antibiotics Nausea And Vomiting   Outpatient Medications Prior to Visit  Medication Sig Dispense Refill  . ACETAMINOPHEN-BUTALBITAL 50-325 MG TABS Take 50 mg by mouth as needed. 30 each 0  . allopurinol (ZYLOPRIM) 300 MG tablet Take 1 tablet (300 mg total) by mouth daily. 90 tablet 1  .  amitriptyline (ELAVIL) 10 MG tablet Take 1 tablet (10 mg total) by mouth at bedtime. 90 tablet 1  . amLODipine (NORVASC) 5 MG tablet Take 1 tablet (5 mg total) by mouth daily. 90 tablet 1  . clopidogrel (PLAVIX) 75 MG tablet Take 75 mg by mouth daily.      . furosemide (LASIX) 20 MG tablet Take 20 mg by mouth every 3 (three) days.    [name] losartan (COZAAR) 100 MG tablet Take 100 mg by mouth daily.    . metoprolol succinate (TOPROL-XL) 25 MG 24 hr tablet Take 1 tablet (25 mg total) by mouth daily. 90 tablet 1  . Multiple Vitamins-Minerals (CENTRUM SILVER 50+WOMEN) TABS Take 1 tablet by mouth daily.    . rosuvastatin (CRESTOR) 10 MG tablet Take 1 tablet (10 mg total) by mouth daily. 90 tablet 3  . pantoprazole (PROTONIX) 40 MG tablet Take 1 tablet (40 mg total) by mouth daily. 30 tablet 3  . DULoxetine (CYMBALTA) 60 MG capsule Take 1 capsule (60 mg total) by mouth daily. 90 capsule 3   No facility-administered medications prior to visit.    Past Medical History:  Diagnosis Date  . COPD (chronic obstructive pulmonary disease) ([place])   . GASTRIC ULCER, ACUTE 04/07/2007   Annotation: EGD 04/07/07 Qualifier: Diagnosis of  By: Carlean Purl MD, Dimas Millin   . GERD (gastroesophageal reflux disease)   . Hyperlipidemia   . Hypertension   . Kidney tumor (benign)   . Personal history  of colonic adenoma 06/07/2012  . Stroke [facility])    x2 2011  . TIA (transient ischemic attack)    Past Surgical History:  Procedure Laterality Date  . ABDOMINAL HYSTERECTOMY    . CATARACT EXTRACTION    . COLONOSCOPY    . CYSTOCELE REPAIR    . ESOPHAGOGASTRODUODENOSCOPY    . KIDNEY SURGERY     benign tumor removal  . ORTHOPEDIC SURGERY     Wrist & elbow  . RECTOCELE REPAIR     Social History   Social History  . Marital status: Married    Spouse name: N/A  . Number of children: 2  . Years of education: N/A   Occupational History  . Retired    Social History Main Topics  . Smoking status: Former Research scientist (life sciences)  . Smokeless  tobacco: Never Used  . Alcohol use No  . Drug use: No  . Sexual activity: Not Asked   Other Topics Concern  . None   Social History Narrative   Married, retired   1 son 1 daughter   2 caffeine/day   Family History  Problem Relation Age of Onset  . Congestive Heart Failure Mother   . Cancer Mother     ovarian  . Heart disease Father   . Diabetes Father   . Kidney disease Father   . Diabetes Sister   . Kidney disease Sister      Review of Systems Constant pain right side of head - over ear and base of neck - a little better w/ amitriptyline behind ear and down  Sleeping difficulty w/ above pain Arthritis sxs, fatigue, pedal edema, coughing as above and some hoarseness All other ROS negative or as per HPI     Objective:   Physical Exam @BP  130/80 (BP Location: Left Arm, Patient Position: Sitting, Cuff Size: Large)   Pulse 80   Ht 5\' 5"  (1.651 m)   Wt 223 lb 6.4 oz (101.3 kg)   BMI 37.18 kg/m @  General:  Well-developed, well-nourished and in no acute distress Eyes:  anicteric. ENT:   Mouth and posterior pharynx free of lesions.  Neck:   supple w/o thyromegaly or mass. There is considerable hypertrophy and prominence of C 7 area Lungs: Clear to auscultation bilaterally. Heart:  S1S2, no rubs, murmurs, gallops. Abdomen:  soft, non-tender, no hepatosplenomegaly, hernia, or mass and BS+.  Lymph:  no cervical or supraclavicular adenopathy. Extremities:   no edema, cyanosis or clubbing Skin   no rash. Neuro:  A&O x 3.  Psych:  appropriate mood and  Affect.   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     Assessment & Plan:   Encounter Diagnoses  Name Primary?  [name] Dysphagia Yes  . Gastroesophageal reflux disease, esophagitis presence not  specified   . Change in voice   . Laryngopharyngeal reflux (LPR) - ?   . Cervical spine disease    Ba swallow, tablet first evaluatuon ? Mano/pH impedance ? ENT evaluation ? EGD/dili - off Plavix reviewed risks of routine EGD but higher risk procedure due to need to hold clopidogrel which creates a rare but extra risk of possible stroke. Await neuro evaluation  - ? Relationship to any of these sxs Suspect C  spine issues could be playing a major role and possible unifying dx She cannot eat with her dentures so that is playing a role in dysphagia also though not all sxs  I appreciate the opportunity to care for this patient. Cc::Stacy Lorretta Harp, MD

## 2015-10-20 ENCOUNTER — Ambulatory Visit (INDEPENDENT_AMBULATORY_CARE_PROVIDER_SITE_OTHER): Payer: Medicare Other | Admitting: Neurology

## 2015-10-20 ENCOUNTER — Encounter: Payer: Self-pay | Admitting: Neurology

## 2015-10-20 VITALS — BP 136/72 | HR 91 | Ht 65.5 in | Wt 225.0 lb

## 2015-10-20 DIAGNOSIS — M5481 Occipital neuralgia: Secondary | ICD-10-CM | POA: Diagnosis not present

## 2015-10-20 DIAGNOSIS — G4486 Cervicogenic headache: Secondary | ICD-10-CM

## 2015-10-20 DIAGNOSIS — R51 Headache: Secondary | ICD-10-CM

## 2015-10-20 DIAGNOSIS — M5030 Other cervical disc degeneration, unspecified cervical region: Secondary | ICD-10-CM

## 2015-10-20 MED ORDER — NORTRIPTYLINE HCL 10 MG PO CAPS
10.0000 mg | ORAL_CAPSULE | Freq: Every day | ORAL | 2 refills | Status: DC
Start: 2015-10-20 — End: 2016-02-08

## 2015-10-20 NOTE — Progress Notes (Addendum)
NEUROLOGY CONSULTATION NOTE  Kerry Powell MRN: [id number] DOB: 03/02/1945  Referring provider: Dr. Quay Burow Primary care provider: Dr. Quay Burow  Reason for consult:  Occipital neuralgia  HISTORY OF PRESENT ILLNESS: Kerry Powell is a 70 year old right-handed woman with HTN, CKD, diabetes, and history of stroke who presents for cervicogenic headache and occipital neuralgia.  History obtained by patient, her husband (who accompanies her) and PCP notes.  She has had history of headaches and chronic neck pain for many years.  They started in 2011 following a stroke, however they have gotten worse over the past year.  She reports burning and itching along the right side of her head from the occiput to the temple.  She also reports the severe pressure-like pain from the right occipital region, around the right ear, and radiating to the right eye and down the right side of her neck.  She has seen several specialists, including neurologists, pain specialists and orthopedic surgeons.  She was told something different by everybody.  One specialist told her it was occipital neuralgia, another told her it was from the neck, while another told her it was a cranial nerve issue.  She underwent steroid injections in the neck and occipital nerve blocks, which were ineffective.  She tried gabapentin, Cymbalta and Lyrica, which were all ineffective.  Epidural injections were recommended but she didn't pursue it.  She recently was started on amitriptyline which helped the burning and itching, but not the headache pain.  She is taking 10mg  at bedtime because 20mg  caused drowsiness.  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  MRI of cervical spine from 01/21/15 showed degenerative disc disease with spurring and degenerative edema at C1-C2, with nerve root impingement at C1-2, C2-3, C3-4 and C4-5.  PAST  MEDICAL HISTORY: Past Medical History:  Diagnosis Date  . COPD (chronic obstructive pulmonary disease) ([place])   . GASTRIC ULCER, ACUTE 04/07/2007   Annotation: EGD 04/07/07 Qualifier: Diagnosis of  By: Carlean Purl MD, Dimas Millin   . GERD (gastroesophageal reflux disease)   . Hyperlipidemia   . Hypertension   . Kidney tumor (benign)   . Personal history of colonic adenoma 06/07/2012  . Stroke [facility])    x2 2011  . TIA (transient ischemic attack)     PAST SURGICAL HISTORY: Past Surgical History:  Procedure Laterality Date  . ABDOMINAL HYSTERECTOMY    . CATARACT EXTRACTION    . COLONOSCOPY    . CYSTOCELE REPAIR    . ESOPHAGOGASTRODUODENOSCOPY    . KIDNEY SURGERY     benign tumor removal  . ORTHOPEDIC SURGERY     Wrist & elbow  . RECTOCELE REPAIR      MEDICATIONS: Current Outpatient Prescriptions on File Prior to Visit  Medication Sig Dispense Refill  . ACETAMINOPHEN-BUTALBITAL 50-325 MG TABS Take 50 mg by mouth as needed. 30 each 0  . allopurinol (ZYLOPRIM) 300 MG tablet Take 1 tablet (300 mg total) by mouth daily. 90 tablet 1  . amLODipine (NORVASC) 5 MG tablet Take 1 tablet (5 mg total) by mouth daily. 90 tablet 1  . clopidogrel (PLAVIX) 75 MG tablet Take 75 mg by mouth daily.      . furosemide (LASIX) 20 MG tablet Take 20 mg by mouth every 3 (three) days.    [name] losartan (COZAAR) 100 MG tablet Take 100 mg by mouth daily.    . metoprolol succinate (  TOPROL-XL) 25 MG 24 hr tablet Take 1 tablet (25 mg total) by mouth daily. 90 tablet 1  . Multiple Vitamins-Minerals (CENTRUM SILVER 50+WOMEN) TABS Take 1 tablet by mouth daily.    . pantoprazole (PROTONIX) 40 MG tablet Take 1 tablet (40 mg total) by mouth 2 (two) times daily. 180 tablet 1  . rosuvastatin (CRESTOR) 10 MG tablet Take 1 tablet (10 mg total) by mouth daily. 90 tablet 3   No current facility-administered medications on file prior to visit.     ALLERGIES: Allergies  Allergen Reactions  . Clindamycin Rash  . Sulfa  Antibiotics Nausea And Vomiting    FAMILY HISTORY: Family History  Problem Relation Age of Onset  . Congestive Heart Failure Mother   . Cancer Mother     ovarian  . Heart disease Father   . Diabetes Father   . Kidney disease Father   . Diabetes Sister   . Kidney disease Sister     SOCIAL HISTORY: Social History   Social History  . Marital status: Married    Spouse name: N/A  . Number of children: 2  . Years of education: N/A   Occupational History  . Retired    Social History Main Topics  . Smoking status: Former Research scientist (life sciences)  . Smokeless tobacco: Never Used  . Alcohol use No  . Drug use: No  . Sexual activity: Not on file   Other Topics Concern  . Not on file   Social History Narrative   Married, retired   1 son 1 daughter   2 caffeine/day    REVIEW OF SYSTEMS: Constitutional: No fevers, chills, or sweats, no generalized fatigue, change in appetite Eyes: No visual changes, double vision, eye pain Ear, nose and throat: No hearing loss, ear pain, nasal congestion, sore throat Cardiovascular: No chest pain, palpitations Respiratory:  No shortness of breath at rest or with exertion, wheezes GastrointestinaI: No nausea, vomiting, diarrhea, abdominal pain, fecal incontinence Genitourinary:  No dysuria, urinary retention or frequency Musculoskeletal:  No neck pain, back pain Integumentary: No rash, pruritus, skin lesions Neurological: as above Psychiatric: No depression, insomnia, anxiety Endocrine: No palpitations, fatigue, diaphoresis, mood swings, change in appetite, change in weight, increased thirst Hematologic/Lymphatic:  No purpura, petechiae. Allergic/Immunologic: no itchy/runny eyes, nasal congestion, recent allergic reactions, rashes  PHYSICAL EXAM: Vitals:   10/20/15 1242  BP: 136/72  Pulse: 91   General: No acute distress.  Patient appears well-groomed.  Head:  Normocephalic/atraumatic Eyes:  fundi examined but not visualized Neck: supple, right  paraspinal tenderness, full range of motion Back: No paraspinal tenderness Heart: regular rate and rhythm Lungs: Clear to auscultation bilaterally. Vascular: No carotid bruits. Neurological Exam: Mental status: alert and oriented to person, place, and time, recent and remote memory intact, fund of knowledge intact, attention and concentration intact, speech fluent and not dysarthric, language intact. Cranial nerves: CN I: not tested CN II: pupils equal, round and reactive to light, visual fields intact CN III, IV, VI:  full range of motion, no nystagmus, no ptosis CN V: facial sensation intact CN VII: upper and lower face symmetric CN VIII: hearing intact CN IX, X: gag intact, uvula midline CN XI: sternocleidomastoid and trapezius muscles intact CN XII: tongue midline Bulk & Tone: normal, no fasciculations. Motor:  5/5 throughout  Sensation: temperature and vibration sensation intact. Deep Tendon Reflexes:  2+ throughout, toes downgoing.  Finger to nose testing:  Without dysmetria.  Heel to shin:  Without dysmetria.  Gait:  Normal station and  stride.  Able to turn and tandem walk. Romberg negative.  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  We will refer her to another pain specialist for epidural injections 2.  In meantime, we will discontinue amitriptyline and start nortriptyline 10mg  at bedtime, which may be better tolerated.  We can increase dose as tolerated. 3.  PCP may want to consider NSAIDs to help treat arthritic pain. 4.  Follow up in 3 months.  Thank you for allowing me to take part in the care of this patient.  Metta Clines, DO  CC:  Billey Gosling, MD

## 2015-10-20 NOTE — Patient Instructions (Signed)
I think it is all coming from the neck 1.  Stop amitriptyline.  Instead, start nortriptyline 10mg  at bedtime.  If headaches not improved, and if tolerating, we can increase dose in 4 weeks. 2.  Refer to pain specialist.  I think epidural injections would be appropriate 3.  Follow up in 3 months.

## 2015-10-26 ENCOUNTER — Ambulatory Visit (HOSPITAL_COMMUNITY)
Admission: RE | Admit: 2015-10-26 | Discharge: 2015-10-26 | Disposition: A | Discharge status: Home / Self Care | Payer: Medicare Other | Source: Ambulatory Visit | Attending: Internal Medicine | Admitting: Internal Medicine

## 2015-10-26 ENCOUNTER — Encounter: Payer: Self-pay | Admitting: Internal Medicine

## 2015-10-26 DIAGNOSIS — K228 Other specified diseases of esophagus: Secondary | ICD-10-CM | POA: Diagnosis not present

## 2015-10-26 DIAGNOSIS — R1310 Dysphagia, unspecified: Secondary | ICD-10-CM | POA: Insufficient documentation

## 2015-10-26 NOTE — Progress Notes (Signed)
Some narrowing at top of esophagus - I think appropriate to schedule EGD and Maloney dilation  Need to check w/ prescriber of clopidogrel re: holding x 5 d I discussed these possibilities w/ her OV when I ordered DG esophagus

## 2015-11-03 NOTE — Progress Notes (Signed)
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 She has a prominent cricopharyngeal impression - tight at top of esophagus.  Based upon these findings and her symptoms I think an EGD and possible dilation make sense but she doesn't have to do it. It is appropriate in my opinion  In order for me to understand things more and possibly help her sxs. It's really up to her to do it.  Other testing options (if she still feels bad) and wants further evaluation include esophageal manometry and pH/impedance testing.  She may need an REV to discuss further perhaps - sorry but I thought I had explained the possible sequence ov events at [place]

## 2015-11-09 ENCOUNTER — Ambulatory Visit (AMBULATORY_SURGERY_CENTER): Payer: Self-pay | Admitting: *Deleted

## 2015-11-09 VITALS — Ht 65.5 in | Wt 228.0 lb

## 2015-11-09 DIAGNOSIS — K222 Esophageal obstruction: Secondary | ICD-10-CM

## 2015-11-09 NOTE — Progress Notes (Signed)
No egg or soy allergy known to patient  No issues with past sedation with any surgeries  or procedures, no intubation problems  No diet pills per patient No home 02 use per patient  takes blood thinners per patient - she is on plavix- ok to hold x 5 days per S burns Pt denies issues with constipation  No A fib or A flutter

## 2015-11-18 ENCOUNTER — Other Ambulatory Visit (INDEPENDENT_AMBULATORY_CARE_PROVIDER_SITE_OTHER): Payer: Medicare Other

## 2015-11-18 DIAGNOSIS — R7989 Other specified abnormal findings of blood chemistry: Secondary | ICD-10-CM

## 2015-11-18 DIAGNOSIS — R946 Abnormal results of thyroid function studies: Secondary | ICD-10-CM | POA: Diagnosis not present

## 2015-11-18 LAB — T4, FREE: Free T4: 0.7 ng/dL (ref 0.60–1.60)

## 2015-11-18 LAB — TSH: TSH: 2.39 u[IU]/mL (ref 0.35–4.50)

## 2015-11-19 ENCOUNTER — Encounter: Payer: Self-pay | Admitting: Internal Medicine

## 2015-11-19 LAB — THYROID ANTIBODIES
Thyroglobulin Ab: <1 IU/mL (ref <2)
Thyroperoxidase Ab SerPl-aCnc: <1 IU/mL (ref <9)

## 2015-11-24 ENCOUNTER — Encounter: Payer: Self-pay | Admitting: Internal Medicine

## 2015-11-24 ENCOUNTER — Ambulatory Visit (AMBULATORY_SURGERY_CENTER): Payer: Medicare Other | Admitting: Internal Medicine

## 2015-11-24 VITALS — BP 131/81 | HR 81 | Temp 98.0°F | Resp 15 | Ht 65.0 in | Wt 225.0 lb

## 2015-11-24 DIAGNOSIS — R1310 Dysphagia, unspecified: Secondary | ICD-10-CM | POA: Diagnosis not present

## 2015-11-24 DIAGNOSIS — K222 Esophageal obstruction: Secondary | ICD-10-CM | POA: Diagnosis not present

## 2015-11-24 MED ORDER — SODIUM CHLORIDE 0.9 % IV SOLN: 500.0000 mL | INTRAVENOUS | Status: DC

## 2015-11-24 NOTE — Progress Notes (Signed)
Entered in error at 1518 and 1515 that polyps were removed from colon. Error in entry.

## 2015-11-24 NOTE — Op Note (Signed)
[facility] Patient Name: Camber Drinnen Procedure Date: 11/24/2015 3:26 PM MRN: [id number] Endoscopist: Gatha Mayer , MD Age: 70 Referring MD:  Date of Birth: 1945/10/01 Gender: Female Account #: [id number] Procedure:                Upper GI endoscopy Indications:              Dysphagia Medicines:                Propofol per Anesthesia, Monitored Anesthesia Care Procedure:                Pre-Anesthesia Assessment:                           - Prior to the procedure, a History and Physical                            was performed, and patient medications and                            allergies were reviewed. The patient's tolerance of                            previous anesthesia was also reviewed. The risks                            and benefits of the procedure and the sedation                            options and risks were discussed with the patient.                            All questions were answered, and informed consent                            was obtained. Prior Anticoagulants: The patient                            last took Plavix (clopidogrel) 5 days prior to the                            procedure. ASA Grade Assessment: III - A patient                            with severe systemic disease. After reviewing the                            risks and benefits, the patient was deemed in                            satisfactory condition to undergo the procedure.                           After obtaining informed consent, the endoscope was  passed under direct vision. Throughout the                            procedure, the patient's blood pressure, pulse, and                            oxygen saturations were monitored continuously. The                            Model GIF-[id number]) scope was introduced                            through the mouth, and advanced to the second part                            of duodenum.  The upper GI endoscopy was                            accomplished without difficulty. The patient                            tolerated the procedure well. Scope In: Scope Out: Findings:                 The esophagus was normal.                           A 2 cm hiatal hernia was present.                           The examined duodenum was normal.                           The exam was otherwise without abnormality.                           The scope was withdrawn. Dilation was performed in                            the entire esophagus with a Maloney dilator with no                            resistance at 81 Fr. Estimated blood loss: none. Complications:            No immediate complications. Estimated Blood Loss:     Estimated blood loss: none. Impression:               - Normal esophagus.                           - 2 cm hiatal hernia.                           - Normal examined duodenum.                           - The examination  was otherwise normal.                           - Dilation performed in the entire esophagus. [place] W/O HEME                           - No specimens collected. Recommendation:           - Patient has a contact number available for                            emergencies. The signs and symptoms of potential                            delayed complications were discussed with the                            patient. Return to normal activities tomorrow.                            Written discharge instructions were provided to the                            patient.                           - Clear liquids x 1 hour then soft foods rest of                            day. Start prior diet tomorrow.                           - Continue present medications.                           - Resume Plavix (clopidogrel) at prior dose                            tomorrow.                           - SHE HAD A PROMINENT  CRICOPHARYNGEAL IMPRESSION                            SEEN ON BA SWALLOW - SO DILATION WAS PERFORMED.                           SHE SHOULD CALL BACK IN A FEW WEEKS IF THIS DOES                            NOT SOLVE HER PROBLEMS. Gatha Mayer, MD 11/24/2015 3:37:54 PM This report has been signed electronically.

## 2015-11-24 NOTE — Progress Notes (Signed)
Called to room to assist during endoscopic procedure.  Patient ID and intended procedure confirmed with present staff. Received instructions for my participation in the procedure from the performing physician.  

## 2015-11-24 NOTE — Progress Notes (Signed)
A and O x3. Report to RN. Tolerated MAC anesthesia well..de 

## 2015-11-24 NOTE — Patient Instructions (Signed)
Impressions/recommendations:  Hiatal hernia (handout given) Dilation (dilation diet handout given)  YOU HAD AN ENDOSCOPIC PROCEDURE TODAY AT [place]:   Refer to the procedure report that was given to you for any specific questions about what was found during the examination.  If the procedure report does not answer your questions, please call your gastroenterologist to clarify.  If you requested that your care partner not be given the details of your procedure findings, then the procedure report has been included in a sealed envelope for you to review at your convenience later.  YOU SHOULD EXPECT: Some feelings of bloating in the abdomen. Passage of more gas than usual.  Walking can help get rid of the air that was put into your GI tract during the procedure and reduce the bloating. If you had a lower endoscopy (such as a colonoscopy or flexible sigmoidoscopy) you may notice spotting of blood in your stool or on the toilet paper. If you underwent a bowel prep for your procedure, you may not have a normal bowel movement for a few days.  Please Note:  You might notice some irritation and congestion in your nose or some drainage.  This is from the oxygen used during your procedure.  There is no need for concern and it should clear up in a day or so.  SYMPTOMS TO REPORT IMMEDIATELY:   Following upper endoscopy (EGD)  Vomiting of blood or coffee ground material  New chest pain or pain under the shoulder blades  Painful or persistently difficult swallowing  New shortness of breath  Fever of 100F or higher  Black, tarry-looking stools  For urgent or emergent issues, a gastroenterologist can be reached at any hour by calling [phone].   DIET:  We do recommend a small meal at first, but then you may proceed to your regular diet.  Drink plenty of fluids but you should avoid alcoholic beverages for 24 hours.  ACTIVITY:  You should plan to take it easy for the rest of today  and you should NOT DRIVE or use heavy machinery until tomorrow (because of the sedation medicines used during the test).    FOLLOW UP: Our staff will call the number listed on your records the next business day following your procedure to check on you and address any questions or concerns that you may have regarding the information given to you following your procedure. If we do not reach you, we will leave a message.  However, if you are feeling well and you are not experiencing any problems, there is no need to return our call.  We will assume that you have returned to your regular daily activities without incident.  If any biopsies were taken you will be contacted by phone or by letter within the next 1-3 weeks.  Please call us at [phone] if you have not heard about the biopsies in 3 weeks.    SIGNATURES/CONFIDENTIALITY: You and/or your care partner have signed paperwork which will be entered into your electronic medical record.  These signatures attest to the fact that that the information above on your After Visit Summary has been reviewed and is understood.  Full responsibility of the confidentiality of this discharge information lies with you and/or your care-partner.

## 2015-11-25 ENCOUNTER — Telehealth: Payer: Self-pay | Admitting: *Deleted

## 2015-11-25 NOTE — Telephone Encounter (Signed)
  Follow up Call-  Call back number 11/24/2015  Post procedure Call Back phone  # [phone]  Permission to leave phone message Yes  Some recent data might be hidden     Patient questions:  Do you have a fever, pain , or abdominal swelling? No. Pain Score  0 *  Have you tolerated food without any problems? Yes.    Have you been able to return to your normal activities? Yes.    Do you have any questions about your discharge instructions: Diet   No. Medications  No. Follow up visit  No.  Do you have questions or concerns about your Care? No.  Actions: * If pain score is 4 or above: No action needed, pain <4.

## 2015-12-04 LAB — IFOBT (OCCULT BLOOD): IFOBT: NEGATIVE

## 2016-01-04 ENCOUNTER — Encounter: Payer: Self-pay | Admitting: Internal Medicine

## 2016-01-10 ENCOUNTER — Ambulatory Visit: Payer: Medicare Other | Admitting: Neurology

## 2016-02-04 ENCOUNTER — Other Ambulatory Visit: Payer: Self-pay | Admitting: Internal Medicine

## 2016-02-06 NOTE — Telephone Encounter (Signed)
Pt last seen on 08/09/15, you have not filled this before. Okay to fill?

## 2016-02-08 ENCOUNTER — Ambulatory Visit (INDEPENDENT_AMBULATORY_CARE_PROVIDER_SITE_OTHER): Payer: Medicare Other | Admitting: Internal Medicine

## 2016-02-08 ENCOUNTER — Other Ambulatory Visit (INDEPENDENT_AMBULATORY_CARE_PROVIDER_SITE_OTHER): Payer: Medicare Other

## 2016-02-08 ENCOUNTER — Encounter: Payer: Self-pay | Admitting: Internal Medicine

## 2016-02-08 VITALS — BP 142/90 | HR 88 | Temp 97.6°F | Resp 16 | Wt 239.0 lb

## 2016-02-08 DIAGNOSIS — G500 Trigeminal neuralgia: Secondary | ICD-10-CM | POA: Insufficient documentation

## 2016-02-08 DIAGNOSIS — R7989 Other specified abnormal findings of blood chemistry: Secondary | ICD-10-CM

## 2016-02-08 DIAGNOSIS — I10 Essential (primary) hypertension: Secondary | ICD-10-CM

## 2016-02-08 DIAGNOSIS — J069 Acute upper respiratory infection, unspecified: Secondary | ICD-10-CM | POA: Insufficient documentation

## 2016-02-08 DIAGNOSIS — R946 Abnormal results of thyroid function studies: Secondary | ICD-10-CM

## 2016-02-08 DIAGNOSIS — E119 Type 2 diabetes mellitus without complications: Secondary | ICD-10-CM

## 2016-02-08 DIAGNOSIS — N189 Chronic kidney disease, unspecified: Secondary | ICD-10-CM | POA: Diagnosis not present

## 2016-02-08 HISTORY — DX: Acute upper respiratory infection, unspecified: J06.9

## 2016-02-08 HISTORY — DX: Trigeminal neuralgia: G50.0

## 2016-02-08 LAB — COMPREHENSIVE METABOLIC PANEL
ALT: 16 U/L (ref 0–35)
AST: 17 U/L (ref 0–37)
Albumin: 4.6 g/dL (ref 3.5–5.2)
Alkaline Phosphatase: 128 U/L — ABNORMAL HIGH (ref 39–117)
BUN: 14 mg/dL (ref 6–23)
CO2: 28 mEq/L (ref 19–32)
Calcium: 9.3 mg/dL (ref 8.4–10.5)
Chloride: 90 mEq/L — ABNORMAL LOW (ref 96–112)
Creatinine, Ser: 0.96 mg/dL (ref 0.40–1.20)
GFR: 60.91 mL/min (ref >60.00)
Glucose, Bld: 113 mg/dL — ABNORMAL HIGH (ref 70–99)
Potassium: 4.6 mEq/L (ref 3.5–5.1)
Sodium: 127 mEq/L — ABNORMAL LOW (ref 135–145)
Total Bilirubin: 0.4 mg/dL (ref 0.2–1.2)
Total Protein: 7.6 g/dL (ref 6.0–8.3)

## 2016-02-08 LAB — HEMOGLOBIN A1C: Hgb A1c MFr Bld: 7.4 % — ABNORMAL HIGH (ref 4.6–6.5)

## 2016-02-08 LAB — TSH: TSH: 2.71 u[IU]/mL (ref 0.35–4.50)

## 2016-02-08 MED ORDER — AMOXICILLIN-POT CLAVULANATE 875-125 MG PO TABS: 1.0000 | ORAL_TABLET | Freq: Two times a day (BID) | ORAL | 0 refills | Status: DC

## 2016-02-08 NOTE — Assessment & Plan Note (Addendum)
Not on medication - anticipate needing to start medication Has gained weight Not compliant with a diabetic diet Check a1c - will start medication after blood work

## 2016-02-08 NOTE — Patient Instructions (Addendum)
  Test(s) ordered today. Your results will be released to MyChart (or called to you) after review, usually within 72hours after test completion. If any changes need to be made, you will be notified at that same time.  All other Health Maintenance issues reviewed.   All recommended immunizations and age-appropriate screenings are up-to-date or discussed.  No immunizations administered today.   Medications reviewed and updated.  No changes recommended at this time.   Please followup in 3 months   

## 2016-02-08 NOTE — Assessment & Plan Note (Signed)
BP elevated today, but is typically lower Start monitoring BP at home Low sodium diet Continue amlodipine,metoprolol, lasix and losartan at current doses cmp

## 2016-02-08 NOTE — Assessment & Plan Note (Signed)
Symptoms present for over two weeks and not improving /worsening Will start antibiotics - augmentin BID x 10 days Call if no improvement

## 2016-02-08 NOTE — Progress Notes (Signed)
Pre visit review using our clinic review tool, if applicable. No additional management support is needed unless otherwise documented below in the visit note. 

## 2016-02-08 NOTE — Assessment & Plan Note (Signed)
Rechecked and was normal  - will recheck today

## 2016-02-08 NOTE — Progress Notes (Signed)
Subjective:    Patient ID: Kerry Powell, female    DOB: 10/17/1945, 70 y.o.   MRN: [id number]  HPI The patient is here for follow up.  Hypertension: She is taking her medication daily. She is not compliant with a low sodium diet.  She denies chest pain, palpitations. She is not exercising regularly.  She does not monitor her blood pressure at home.    Diabetes: She has not been started on medication yet.  She has not been compliant with a diabetic diet. She is not exercising regularly and has been sedentary.  She has gained weight.    Trigeminal neuralgia:  Her pain has gotten worse.  The pain is constant.  She is not sleeping.  She has seen three different neurologists and a pain management doctor.  She is thinking about seeing neurosurgery and considering surgery - she thinks she was referred by the last neurologist, but has not heard anything.  With the pain she is not moving around much and has had increased congestion.  She has a cough - braking up phlegm at times.  The phlegm is very thick and yellow.  She has occasional wheeze and sob, which is more chronic.[name]  She denies fever.  She has had her symptoms for 2-3 weeks.    Medications and allergies reviewed with patient and updated if appropriate.  Patient Active Problem List   Diagnosis Date Noted  . Elevated TSH 08/09/2015  . Anxiety 07/26/2015  . Chronic neck pain 04/30/2015  . Diabetes ([place]) 04/27/2015  . History of CVA (cerebrovascular accident) 04/26/2015  . History of gout 04/26/2015  . CKD (chronic kidney disease) 04/26/2015  . Essential hypertension, benign 04/26/2015  . Snoring 04/26/2015  . Localized edema 04/26/2015  . Dysphagia 04/26/2015  . Cough 04/26/2015  . GERD (gastroesophageal reflux disease) 04/26/2015  . Personal history of colonic adenoma 05/29/2007  . HIATAL HERNIA 04/07/2007  . RESTLESS LEG SYNDROME 04/04/2007  . 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 COPD 04/04/2007  . IRRITABLE BOWEL SYNDROME  04/04/2007  . OSTEOARTHRITIS 04/04/2007  . BACK PAIN, CHRONIC 04/04/2007    Current Outpatient Prescriptions on File Prior to Visit  Medication Sig Dispense Refill  . ACETAMINOPHEN-BUTALBITAL 50-325 MG TABS Take 50 mg by mouth as needed. 30 each 0  . allopurinol (ZYLOPRIM) 300 MG tablet Take 1 tablet (300 mg total) by mouth daily. 90 tablet 1  . amLODipine (NORVASC) 5 MG tablet Take 1 tablet (5 mg total) by mouth daily. 90 tablet 1  . clopidogrel (PLAVIX) 75 MG tablet Take 75 mg by mouth daily.      . furosemide (LASIX) 20 MG tablet Take 20 mg by mouth every 3 (three) days.    [name] losartan (COZAAR) 100 MG tablet Take 100 mg by mouth daily.    . metoprolol succinate (TOPROL-XL) 25 MG 24 hr tablet Take 1 tablet (25 mg total) by mouth daily. 90 tablet 1  . pantoprazole (PROTONIX) 40 MG tablet Take 1 tablet (40 mg total) by mouth 2 (two) times daily. 180 tablet 1  . rosuvastatin (CRESTOR) 10 MG tablet Take 1 tablet (10 mg total) by mouth daily. 90 tablet 3   No current facility-administered medications on file prior to visit.     Past Medical History:  Diagnosis Date  . Anxiety   . Arthritis   . Cataract    removed both eyes  . COPD (chronic obstructive pulmonary disease) ([place])    pt denies - told this while was  smoking -  . GASTRIC ULCER, ACUTE 04/07/2007   Annotation: EGD 04/07/07 Qualifier: Diagnosis of  By: Carlean Purl MD, Dimas Millin   . GERD (gastroesophageal reflux disease)   . Hyperlipidemia   . Hypertension   . Kidney tumor (benign)   . Personal history of colonic adenoma 06/07/2012  . Stroke [facility])    x2 2011  . TIA (transient ischemic attack)     Past Surgical History:  Procedure Laterality Date  . ABDOMINAL HYSTERECTOMY    . CATARACT EXTRACTION    . COLONOSCOPY    . CYSTOCELE REPAIR    . ESOPHAGOGASTRODUODENOSCOPY    . KIDNEY SURGERY     benign tumor removal  . ORTHOPEDIC SURGERY     Wrist & elbow  . POLYPECTOMY    . RECTOCELE REPAIR    . UPPER GASTROINTESTINAL  ENDOSCOPY      Social History   Social History  . Marital status: Married    Spouse name: N/A  . Number of children: 2  . Years of education: N/A   Occupational History  . Retired    Social History Main Topics  . Smoking status: Former Smoker    Quit date: 06/30/2009  . Smokeless tobacco: Never Used  . Alcohol use No  . Drug use: No  . Sexual activity: Not on file   Other Topics Concern  . Not on file   Social History Narrative   Married, retired   1 son 1 daughter   2 caffeine/day    Family History  Problem Relation Age of Onset  . Congestive Heart Failure Mother   . Cancer Mother     ovarian  . Heart disease Father   . Diabetes Father   . Kidney disease Father   . Diabetes Sister   . Kidney disease Sister   . Colon cancer Neg Hx   . Colon polyps Neg Hx   . Esophageal cancer Neg Hx   . Rectal cancer Neg Hx   . Stomach cancer Neg Hx     Review of Systems  Constitutional: Negative for fever.  HENT: Positive for trouble swallowing (occ) and voice change.   Respiratory: Positive for cough (productive at times), shortness of breath and wheezing.   Cardiovascular: Positive for leg swelling (mild). Negative for chest pain and palpitations.  Gastrointestinal: Negative for abdominal pain.       No gerd  Neurological: Positive for light-headedness (occ), numbness and headaches.       Objective:   Vitals:   02/08/16 1320  BP: (!) 154/108  Pulse: 88  Resp: 16  Temp: 97.6 F (36.4 C)   Filed Weights   02/08/16 1320  Weight: 239 lb (108.4 kg)   Body mass index is 39.77 kg/m.   Physical Exam    Constitutional: Appears well-developed and well-nourished. No distress.  HENT:  Head: Normocephalic and atraumatic.  Neck: Neck supple. No tracheal deviation present. No thyromegaly present.  No cervical lymphadenopathy Cardiovascular: Normal rate, regular rhythm and normal heart sounds.   No murmur heard. No carotid bruit .  No edema Pulmonary/Chest: Effort  normal and breath sounds normal. No respiratory distress. No has no wheezes. No rales.  Skin: Skin is warm and dry. Not diaphoretic.  Psychiatric: Normal mood and affect. Behavior is normal.      Assessment & Plan:    See Problem List for Assessment and Plan of chronic medical problems.   F/u in 3 months for diabetes, htn

## 2016-02-08 NOTE — Assessment & Plan Note (Addendum)
Has seen 3 neurologists and pain management Has tried several medications that have not helped In constant severe pain that is affecting the quality of her life Advised her to follow up with Dr Dimas Millin - last neurologist - consider change in medication - request referral to neurosurgery if it has not been done

## 2016-02-09 ENCOUNTER — Other Ambulatory Visit: Payer: Self-pay | Admitting: Emergency Medicine

## 2016-02-09 MED ORDER — METFORMIN HCL 500 MG PO TABS: 500.0000 mg | ORAL_TABLET | Freq: Two times a day (BID) | ORAL | 1 refills | Status: DC

## 2016-02-10 ENCOUNTER — Encounter: Payer: Self-pay | Admitting: Internal Medicine

## 2016-02-13 ENCOUNTER — Other Ambulatory Visit: Payer: Self-pay | Admitting: Internal Medicine

## 2016-02-13 MED ORDER — OMEPRAZOLE 20 MG PO CPDR: 20.0000 mg | DELAYED_RELEASE_CAPSULE | Freq: Every day | ORAL | 1 refills | Status: DC

## 2016-02-13 NOTE — Telephone Encounter (Signed)
Please advise, Do not see on current med list.

## 2016-02-14 MED ORDER — CEFDINIR 300 MG PO CAPS: 300.0000 mg | ORAL_CAPSULE | Freq: Two times a day (BID) | ORAL | 0 refills | Status: DC

## 2016-02-14 NOTE — Addendum Note (Signed)
Addended by: Binnie Rail on: 02/14/2016 08:34 AM   Modules accepted: Orders

## 2016-03-16 ENCOUNTER — Other Ambulatory Visit: Payer: Self-pay | Admitting: Internal Medicine

## 2016-04-02 ENCOUNTER — Telehealth: Payer: Self-pay | Admitting: Internal Medicine

## 2016-04-02 MED ORDER — ROSUVASTATIN CALCIUM 10 MG PO TABS: 10.0000 mg | ORAL_TABLET | Freq: Every day | ORAL | 3 refills | Status: DC

## 2016-04-02 NOTE — Telephone Encounter (Signed)
Pt request refill for rosuvastatin (CRESTOR) 10 MG tablet 90 days supply send to [facility]. Please help

## 2016-05-08 ENCOUNTER — Ambulatory Visit: Payer: Medicare Other | Admitting: Internal Medicine

## 2016-05-09 ENCOUNTER — Other Ambulatory Visit (INDEPENDENT_AMBULATORY_CARE_PROVIDER_SITE_OTHER): Payer: Medicare Other

## 2016-05-09 ENCOUNTER — Ambulatory Visit (INDEPENDENT_AMBULATORY_CARE_PROVIDER_SITE_OTHER): Payer: Medicare Other | Admitting: Internal Medicine

## 2016-05-09 ENCOUNTER — Encounter: Payer: Self-pay | Admitting: Internal Medicine

## 2016-05-09 VITALS — BP 126/78 | HR 87 | Resp 16 | Wt 235.0 lb

## 2016-05-09 DIAGNOSIS — F419 Anxiety disorder, unspecified: Secondary | ICD-10-CM | POA: Diagnosis not present

## 2016-05-09 DIAGNOSIS — E119 Type 2 diabetes mellitus without complications: Secondary | ICD-10-CM

## 2016-05-09 DIAGNOSIS — Z8739 Personal history of other diseases of the musculoskeletal system and connective tissue: Secondary | ICD-10-CM | POA: Diagnosis not present

## 2016-05-09 DIAGNOSIS — N189 Chronic kidney disease, unspecified: Secondary | ICD-10-CM

## 2016-05-09 DIAGNOSIS — I10 Essential (primary) hypertension: Secondary | ICD-10-CM | POA: Diagnosis not present

## 2016-05-09 DIAGNOSIS — K219 Gastro-esophageal reflux disease without esophagitis: Secondary | ICD-10-CM

## 2016-05-09 DIAGNOSIS — R7989 Other specified abnormal findings of blood chemistry: Secondary | ICD-10-CM

## 2016-05-09 DIAGNOSIS — Z1159 Encounter for screening for other viral diseases: Secondary | ICD-10-CM | POA: Diagnosis not present

## 2016-05-09 DIAGNOSIS — M5481 Occipital neuralgia: Secondary | ICD-10-CM | POA: Insufficient documentation

## 2016-05-09 DIAGNOSIS — R946 Abnormal results of thyroid function studies: Secondary | ICD-10-CM

## 2016-05-09 HISTORY — DX: Occipital neuralgia: M54.81

## 2016-05-09 LAB — COMPREHENSIVE METABOLIC PANEL
ALT: 18 U/L (ref 0–35)
AST: 14 U/L (ref 0–37)
Albumin: 4.2 g/dL (ref 3.5–5.2)
Alkaline Phosphatase: 93 U/L (ref 39–117)
BUN: 24 mg/dL — ABNORMAL HIGH (ref 6–23)
CO2: 29 mEq/L (ref 19–32)
Calcium: 9.6 mg/dL (ref 8.4–10.5)
Chloride: 102 mEq/L (ref 96–112)
Creatinine, Ser: 1.18 mg/dL (ref 0.40–1.20)
GFR: 47.97 mL/min — ABNORMAL LOW (ref >60.00)
Glucose, Bld: 121 mg/dL — ABNORMAL HIGH (ref 70–99)
Potassium: 4.3 mEq/L (ref 3.5–5.1)
Sodium: 139 mEq/L (ref 135–145)
Total Bilirubin: 0.3 mg/dL (ref 0.2–1.2)
Total Protein: 7.2 g/dL (ref 6.0–8.3)

## 2016-05-09 LAB — LIPID PANEL
Cholesterol: 149 mg/dL (ref 0–200)
HDL: 38.9 mg/dL — ABNORMAL LOW (ref >39.00)
LDL Cholesterol: 76 mg/dL (ref 0–99)
NonHDL: 110.55
Total CHOL/HDL Ratio: 4
Triglycerides: 173 mg/dL — ABNORMAL HIGH (ref 0.0–149.0)
VLDL: 34.6 mg/dL (ref 0.0–40.0)

## 2016-05-09 LAB — TSH: TSH: 1.59 u[IU]/mL (ref 0.35–4.50)

## 2016-05-09 LAB — URIC ACID: Uric Acid, Serum: 3.4 mg/dL (ref 2.4–7.0)

## 2016-05-09 LAB — HEMOGLOBIN A1C: Hgb A1c MFr Bld: 7.5 % — ABNORMAL HIGH (ref 4.6–6.5)

## 2016-05-09 NOTE — Assessment & Plan Note (Signed)
Taking cymbalta Diclofenac as needed acet-butalbital as needed Following with Dr Dimas Millin

## 2016-05-09 NOTE — Progress Notes (Signed)
Pre visit review using our clinic review tool, if applicable. No additional management support is needed unless otherwise documented below in the visit note. 

## 2016-05-09 NOTE — Patient Instructions (Addendum)
  Test(s) ordered today. Your results will be released to [place] (or called to you) after review, usually within 72hours after test completion. If any changes need to be made, you will be notified at that same time.  All other Health Maintenance issues reviewed.   All recommended immunizations and age-appropriate screenings are up-to-date or discussed.  No immunizations administered today.   Medications reviewed and updated.  No changes recommended at this time.    Please followup in 6 months for a physical / wellness

## 2016-05-09 NOTE — Assessment & Plan Note (Signed)
BP well controlled Current regimen effective and well tolerated Continue current medications at current doses cmp  

## 2016-05-09 NOTE — Assessment & Plan Note (Signed)
No episodes of gout on allopurinol Check uric acid level Continue allopurinol

## 2016-05-09 NOTE — Assessment & Plan Note (Signed)
Was elevated at one point, continue to monitor Check tsh

## 2016-05-09 NOTE — Progress Notes (Signed)
Subjective:    Patient ID: Kerry Powell, female    DOB: 1945-04-29, 71 y.o.   MRN: [id number]  HPI The patient is here for follow up.  Hypertension: She is taking her medication daily. She is not compliant with a low sodium diet.  She denies chest pain, palpitations, edema, and lightheadedness. She is not exercising regularly.  She does not monitor her blood pressure at home.    Diabetes: She is taking her medication daily as prescribed - metformin was started 3 months ago. She denies side effects She is not compliant with a diabetic diet. She is not exercising regularly.    Hyperlipidemia: She is taking her medication daily. She is not compliant with a low fat/cholesterol diet. She is not exercising regularly. She denies myalgias.   GERD:  She is taking her medication daily as prescribed.  She denies any GERD symptoms and feels her GERD is well controlled.   Gout:  She has had some pain in her toe recently - it was transient.  She was not sure if it was gout or not. She is taking the allpurinol daily.   Medications and allergies reviewed with patient and updated if appropriate.  Patient Active Problem List   Diagnosis Date Noted  . Trigeminal neuralgia of right side of face 02/08/2016  . Elevated TSH 08/09/2015  . Anxiety 07/26/2015  . Chronic neck pain 04/30/2015  . Diabetes ([place]) 04/27/2015  . History of CVA (cerebrovascular accident) 04/26/2015  . History of gout 04/26/2015  . CKD (chronic kidney disease) 04/26/2015  . Essential hypertension, benign 04/26/2015  . Snoring 04/26/2015  . Localized edema 04/26/2015  . Dysphagia 04/26/2015  . GERD (gastroesophageal reflux disease) 04/26/2015  . Personal history of colonic adenoma 05/29/2007  . HIATAL HERNIA 04/07/2007  . RESTLESS LEG SYNDROME 04/04/2007  . 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 COPD 04/04/2007  . IRRITABLE BOWEL SYNDROME 04/04/2007  . OSTEOARTHRITIS 04/04/2007  . BACK PAIN, CHRONIC 04/04/2007     Current Outpatient Prescriptions on File Prior to Visit  Medication Sig Dispense Refill  . ACETAMINOPHEN-BUTALBITAL 50-325 MG TABS Take 50 mg by mouth as needed. 30 each 0  . allopurinol (ZYLOPRIM) 300 MG tablet Take 1 tablet (300 mg total) by mouth daily. 90 tablet 1  . amLODipine (NORVASC) 5 MG tablet TAKE 1 TABLET (5 MG TOTAL) BY MOUTH DAILY. 90 tablet 2  . clopidogrel (PLAVIX) 75 MG tablet Take 75 mg by mouth daily.      . DULOXETINE HCL PO Take by mouth daily.    . furosemide (LASIX) 20 MG tablet Take 20 mg by mouth every 3 (three) days.    [name] losartan (COZAAR) 100 MG tablet Take 100 mg by mouth daily.    . metFORMIN (GLUCOPHAGE) 500 MG tablet Take 1 tablet (500 mg total) by mouth 2 (two) times daily with a meal. 180 tablet 1  . metoprolol succinate (TOPROL-XL) 25 MG 24 hr tablet TAKE 1 TABLET (25 MG TOTAL) BY MOUTH DAILY. 90 tablet 2  . pantoprazole (PROTONIX) 40 MG tablet TAKE 1 TABLET BY MOUTH EVERY DAY 90 tablet 3  . rosuvastatin (CRESTOR) 10 MG tablet Take 1 tablet (10 mg total) by mouth daily. 90 tablet 3   No current facility-administered medications on file prior to visit.     Past Medical History:  Diagnosis Date  . Anxiety   . Arthritis   . Cataract    removed both eyes  . COPD (chronic obstructive pulmonary disease) ([place])  pt denies - told this while was smoking -  . GASTRIC ULCER, ACUTE 04/07/2007   Annotation: EGD 04/07/07 Qualifier: Diagnosis of  By: Carlean Purl MD, Dimas Millin   . GERD (gastroesophageal reflux disease)   . Hyperlipidemia   . Hypertension   . Kidney tumor (benign)   . Personal history of colonic adenoma 06/07/2012  . Stroke [facility])    x2 2011  . TIA (transient ischemic attack)     Past Surgical History:  Procedure Laterality Date  . ABDOMINAL HYSTERECTOMY    . CATARACT EXTRACTION    . COLONOSCOPY    . CYSTOCELE REPAIR    . ESOPHAGOGASTRODUODENOSCOPY    . KIDNEY SURGERY     benign tumor removal  . ORTHOPEDIC SURGERY     Wrist & elbow  .  POLYPECTOMY    . RECTOCELE REPAIR    . UPPER GASTROINTESTINAL ENDOSCOPY      Social History   Social History  . Marital status: Married    Spouse name: N/A  . Number of children: 2  . Years of education: N/A   Occupational History  . Retired    Social History Main Topics  . Smoking status: Former Smoker    Quit date: 06/30/2009  . Smokeless tobacco: Never Used  . Alcohol use No  . Drug use: No  . Sexual activity: Not Asked   Other Topics Concern  . None   Social History Narrative   Married, retired   1 son 1 daughter   2 caffeine/day    Family History  Problem Relation Age of Onset  . Congestive Heart Failure Mother   . Cancer Mother     ovarian  . Heart disease Father   . Diabetes Father   . Kidney disease Father   . Diabetes Sister   . Kidney disease Sister   . Colon cancer Neg Hx   . Colon polyps Neg Hx   . Esophageal cancer Neg Hx   . Rectal cancer Neg Hx   . Stomach cancer Neg Hx     Review of Systems  Constitutional: Negative for chills and fever.  Respiratory: Positive for shortness of breath (with exertionn - she feels it is related to her weight). Negative for cough and wheezing.   Cardiovascular: Positive for leg swelling (mild). Negative for chest pain and palpitations.  Neurological: Positive for headaches. Negative for light-headedness.       Objective:   Vitals:   05/09/16 1116  BP: 126/78  Pulse: 87  Resp: 16   Wt Readings from Last 3 Encounters:  05/09/16 235 lb (106.6 kg)  02/08/16 239 lb (108.4 kg)  11/24/15 225 lb (102.1 kg)   Body mass index is 39.11 kg/m.   Physical Exam    Constitutional: Appears well-developed and well-nourished. No distress.  HENT:  Head: Normocephalic and atraumatic.  Neck: Neck supple. No tracheal deviation present. No thyromegaly present.  No cervical lymphadenopathy Cardiovascular: Normal rate, regular rhythm and normal heart sounds.   No murmur heard. No carotid bruit .  No  edema Pulmonary/Chest: Effort normal and breath sounds normal. No respiratory distress. No has no wheezes. No rales.  Skin: Skin is warm and dry. Not diaphoretic.  Psychiatric: Normal mood and affect. Behavior is normal.      Assessment & Plan:    See Problem List for Assessment and Plan of chronic medical problems.   FU in 6 months - wellness with nurse, CPE with me

## 2016-05-09 NOTE — Assessment & Plan Note (Addendum)
GERD controlled Continue daily medication - protonix 40  Mg daily

## 2016-05-09 NOTE — Assessment & Plan Note (Addendum)
Started on metformin 3 months ago Has not been compliant with a diabetic diet Not exercising Check a1c Low sugar / carb diet -- stop donuts, coke, juice Stressed regular exercise, weight loss

## 2016-05-09 NOTE — Assessment & Plan Note (Signed)
Taking cymbalta daily

## 2016-05-09 NOTE — Assessment & Plan Note (Signed)
cmp

## 2016-05-10 LAB — HEPATITIS C ANTIBODY: HCV Ab: NEGATIVE

## 2016-05-12 ENCOUNTER — Encounter: Payer: Self-pay | Admitting: Internal Medicine

## 2016-05-28 ENCOUNTER — Other Ambulatory Visit: Payer: Self-pay | Admitting: Internal Medicine

## 2016-06-25 ENCOUNTER — Telehealth: Payer: Self-pay | Admitting: *Deleted

## 2016-06-25 DIAGNOSIS — E7800 Pure hypercholesterolemia, unspecified: Secondary | ICD-10-CM

## 2016-06-25 DIAGNOSIS — E1169 Type 2 diabetes mellitus with other specified complication: Secondary | ICD-10-CM | POA: Insufficient documentation

## 2016-06-25 DIAGNOSIS — E785 Hyperlipidemia, unspecified: Secondary | ICD-10-CM | POA: Insufficient documentation

## 2016-06-25 NOTE — Telephone Encounter (Signed)
We can try lipitor 20 mg daily - it is not as strong as crestor so it will need to be a higher dose.  Ok to send if she wants to try it.  Needs cmp, lipid panel 6-8 weeks after starting it (ordered)

## 2016-06-25 NOTE — Telephone Encounter (Signed)
Rec'd fax stating pt is requesting alternative for crestor Med is too expensive w/plan...Kerry Powell

## 2016-06-26 NOTE — Telephone Encounter (Signed)
Called pt no answer LMOM RTC.../lmb 

## 2016-06-27 ENCOUNTER — Encounter: Payer: Self-pay | Admitting: Internal Medicine

## 2016-06-27 NOTE — Telephone Encounter (Signed)
ok 

## 2016-06-27 NOTE — Telephone Encounter (Signed)
Called pt gave her MD response. Pt states she is just going to stay w/the crestor. Pharmacy was trying to save her some money, even w/the lipitor it's only a $10 difference so she will continue taking the crestor...Kerry Powell

## 2016-06-28 MED ORDER — CLOPIDOGREL BISULFATE 75 MG PO TABS: 75.0000 mg | ORAL_TABLET | Freq: Every day | ORAL | 1 refills | Status: DC

## 2016-10-22 ENCOUNTER — Other Ambulatory Visit: Payer: Self-pay | Admitting: Internal Medicine

## 2016-10-31 DIAGNOSIS — M5412 Radiculopathy, cervical region: Secondary | ICD-10-CM | POA: Insufficient documentation

## 2016-11-08 ENCOUNTER — Encounter: Payer: Self-pay | Admitting: Internal Medicine

## 2016-11-08 ENCOUNTER — Other Ambulatory Visit: Payer: Self-pay | Admitting: Internal Medicine

## 2016-11-09 ENCOUNTER — Ambulatory Visit: Payer: Medicare Other

## 2016-11-09 ENCOUNTER — Ambulatory Visit: Payer: Medicare Other | Admitting: Internal Medicine

## 2016-11-16 NOTE — Progress Notes (Signed)
Pre visit review using our clinic review tool, if applicable. No additional management support is needed unless otherwise documented below in the visit note. 

## 2016-11-16 NOTE — Progress Notes (Addendum)
Subjective:   Kerry Powell is a 71 y.o. female who presents for Medicare Annual (Subsequent) preventive examination.  Review of Systems:  No ROS.  Medicare Wellness Visit. Additional risk factors are reflected in the social history.  Cardiac Risk Factors include: advanced age (>25men, >56 women);diabetes mellitus;hypertension;obesity (BMI >30kg/m2);sedentary lifestyle Sleep patterns: feels rested on waking, gets up 1-2 times nightly to void and sleeps 3 hours nightly.  Patient reports insomnia issues, discussed recommended sleep tips and stress reduction tips.   Home Safety/Smoke Alarms: Feels safe in home. Smoke alarms in place.  Living environment; residence and Firearm Safety: [place], no firearms Lives with husband, no needs for DME, good support system. Seat Belt Safety/Bike Helmet: Wears seat belt.    Objective:     Vitals: BP (!) 144/85   Pulse 79   Temp 97.9 F (36.6 C)   Resp 20   Ht 5\' 5"  (1.651 m)   Wt 234 lb (106.1 kg)   SpO2 98%   BMI 38.94 kg/m   Body mass index is 38.94 kg/m.   Tobacco History  Smoking Status  . Former Smoker  . Quit date: 06/30/2009  Smokeless Tobacco  . Never Used     Counseling given: Not Answered   Past Medical History:  Diagnosis Date  . Anxiety   . Arthritis   . Cataract    removed both eyes  . COPD (chronic obstructive pulmonary disease) ([place])    pt denies - told this while was smoking -  . GASTRIC ULCER, ACUTE 04/07/2007   Annotation: EGD 04/07/07 Qualifier: Diagnosis of  By: Carlean Purl MD, Dimas Millin   . GERD (gastroesophageal reflux disease)   . Hyperlipidemia   . Hypertension   . Kidney tumor (benign)   . Personal history of colonic adenoma 06/07/2012  . Stroke [facility])    x2 2011  . TIA (transient ischemic attack)    Past Surgical History:  Procedure Laterality Date  . ABDOMINAL HYSTERECTOMY    . CATARACT EXTRACTION    . COLONOSCOPY    . CYSTOCELE REPAIR    . ESOPHAGOGASTRODUODENOSCOPY    . KIDNEY SURGERY      benign tumor removal  . ORTHOPEDIC SURGERY     Wrist & elbow  . POLYPECTOMY    . RECTOCELE REPAIR    . UPPER GASTROINTESTINAL ENDOSCOPY     Family History  Problem Relation Age of Onset  . Congestive Heart Failure Mother   . Cancer Mother        ovarian  . Heart disease Father   . Diabetes Father   . Kidney disease Father   . Diabetes Sister   . Kidney disease Sister   . Colon cancer Neg Hx   . Colon polyps Neg Hx   . Esophageal cancer Neg Hx   . Rectal cancer Neg Hx   . Stomach cancer Neg Hx    History  Sexual Activity  . Sexual activity: Not on file    Outpatient Encounter Prescriptions as of 11/19/2016  Medication Sig  . ACETAMINOPHEN-BUTALBITAL 50-325 MG TABS TAKE 1 TAB BY MOUTH AS NEEDED.  [name] allopurinol (ZYLOPRIM) 300 MG tablet TAKE 1 TABLET (300 MG TOTAL) BY MOUTH DAILY.  [name] amLODipine (NORVASC) 5 MG tablet TAKE 1 TABLET (5 MG TOTAL) BY MOUTH DAILY.  [name] clopidogrel (PLAVIX) 75 MG tablet Take 1 tablet (75 mg total) by mouth daily.  . diclofenac (CATAFLAM) 50 MG tablet Take 50 mg by mouth.  . diclofenac (VOLTAREN) 50  MG EC tablet Take 50 mg by mouth 3 (three) times daily.  . DULoxetine (CYMBALTA) 60 MG capsule Take 120 mg by mouth.  [name] ketoconazole (NIZORAL) 2 % cream APPLY TO AFFECTED AREA UP TO TWICE A DAY AS NEEDED  . losartan (COZAAR) 100 MG tablet TAKE 1 TABLET (100 MG TOTAL) BY MOUTH DAILY.  . metFORMIN (GLUCOPHAGE) 500 MG tablet TAKE 1 TABLET (500 MG TOTAL) BY MOUTH 2 (TWO) TIMES DAILY WITH A MEAL.  . metoprolol succinate (TOPROL-XL) 25 MG 24 hr tablet TAKE 1 TABLET (25 MG TOTAL) BY MOUTH DAILY.  [name] omeprazole (PRILOSEC) 20 MG capsule Take 20 mg by mouth daily.  . pantoprazole (PROTONIX) 40 MG tablet TAKE 1 TABLET BY MOUTH EVERY DAY  . rosuvastatin (CRESTOR) 10 MG tablet Take 1 tablet (10 mg total) by mouth daily.  [name] zolpidem (AMBIEN) 10 MG tablet 1 po 1 hour before bedtime  . [DISCONTINUED] ACETAMINOPHEN-BUTALBITAL 50-325 MG TABS TAKE 1 TAB BY MOUTH AS NEEDED.    . [DISCONTINUED] allopurinol (ZYLOPRIM) 300 MG tablet Take 300 mg by mouth.  . furosemide (LASIX) 20 MG tablet Take 1 tablet (20 mg total) by mouth every 3 (three) days.  . [DISCONTINUED] furosemide (LASIX) 20 MG tablet Take 20 mg by mouth every 3 (three) days.   No facility-administered encounter medications on file as of 11/19/2016.     Activities of Daily Living In your present state of health, do you have any difficulty performing the following activities: 11/19/2016  Hearing? N  Vision? N  Difficulty concentrating or making decisions? N  Walking or climbing stairs? N  Dressing or bathing? N  Doing errands, shopping? N  Preparing Food and eating ? N  Using the Toilet? N  In the past six months, have you accidently leaked urine? N  Do you have problems with loss of bowel control? N  Managing your Medications? N  Managing your Finances? N  Housekeeping or managing your Housekeeping? N  Some recent data might be hidden    Patient Care Team: Binnie Rail, MD as PCP - General (Internal Medicine) Grayland Ormond, MD as Referring Physician (Neurosurgery) Gatha Mayer, MD as Consulting Physician (Gastroenterology) Jackelyn Poling, MD as Referring Physician (Psychiatry)    Assessment:    Physical assessment deferred to PCP.  Exercise Activities and Dietary recommendations Current Exercise Habits: The patient does not participate in regular exercise at present (chair exercise pamphlets provided), Exercise limited by: orthopedic condition(s)  Diet (meal preparation, eat out, water intake, caffeinated beverages, dairy products, fruits and vegetables): in general, a "healthy" diet    Reviewed heart healthy and diabetic diet, encouraged patient to increase daily water intake.   Goals    . stay as healthy and as independent as possible      Fall Risk Fall Risk  11/19/2016 10/20/2015 04/26/2015  Falls in the past year? Yes No No  Number falls in past yr: 1 - -  Follow up Falls  prevention discussed - -   Depression Screen PHQ 2/9 Scores 11/19/2016 04/26/2015  PHQ - 2 Score 2 1  PHQ- 9 Score 8 -     Cognitive Function MMSE - Mini Mental State Exam 11/19/2016  Orientation to time 5  Orientation to Place 5  Registration 3  Attention/ Calculation 4  Recall 1  Language- name 2 objects 2  Language- repeat 1  Language- follow 3 step command 3  Language- read & follow direction 1  Write a sentence 1  Copy design 1  Total score 27         There is no immunization history on file for this patient. Screening Tests Health Maintenance  Topic Date Due  . OPHTHALMOLOGY EXAM  03/16/1955  . TETANUS/TDAP  03/15/1964  . MAMMOGRAM  03/16/1995  . DEXA SCAN  03/15/2010  . HEMOGLOBIN A1C  11/09/2016  . PNA vac Low Risk Adult (1 of 2 - PCV13) 02/26/2017 (Originally 03/15/2010)  . INFLUENZA VACCINE  05/26/2017 (Originally 09/26/2016)  . COLONOSCOPY  04/06/2017  . FOOT EXAM  11/19/2017  . Hepatitis C Screening  Completed      Plan:    Continue doing brain stimulating activities (puzzles, reading, adult coloring books, staying active) to keep memory sharp.   Continue to eat heart healthy diet (full of fruits, vegetables, whole grains, lean protein, water--limit salt, fat, and sugar intake) and increase physical activity as tolerated.   I have personally reviewed and noted the following in the patient's chart:   . Medical and social history . Use of alcohol, tobacco or illicit drugs  . Current medications and supplements . Functional ability and status . Nutritional status . Physical activity . Advanced directives . List of other physicians . Vitals . Screenings to include cognitive, depression, and falls . Referrals and appointments  In addition, I have reviewed and discussed with patient certain preventive protocols, quality metrics, and best practice recommendations. A written personalized care plan for preventive services as well as general preventive  health recommendations were provided to patient.     Michiel Cowboy, RN  11/19/2016   Medical screening examination/treatment/procedure(s) were performed by non-physician practitioner and as supervising physician I was immediately available for consultation/collaboration. I agree with above. Binnie Rail, MD

## 2016-11-18 NOTE — Progress Notes (Signed)
Subjective:    Patient ID: Kerry Powell, female    DOB: 10-26-1945, 71 y.o.   MRN: [id number]  HPI The patient is here for follow up.  Hypertension: She is taking her medication daily. She is compliant with a low sodium diet.  She denies chest pain, palpitations, edema. She is not exercising regularly.      Diabetes: She is taking her medication daily as prescribed. She is compliant with a diabetic diet. She is not exercising regularly.  She checks her feet daily and denies foot lesions. She is up-to-date with an ophthalmology examination.   GERD:  She is taking her medication daily as prescribed.  She denies any GERD symptoms and feels her GERD is well controlled.   Hyperlipidemia: She is taking her medication daily. She is compliant with a low fat/cholesterol diet. She is not exercising regularly. She denies myalgias.   H/o stroke 2011:  She is taking the statin and plavix daily.    Chronic neck pain, occipital neuralgia, cervical radiculopathy:  Has an appointment with neurosurgery on 10/4 for second opinion.  She has continuous pain that is severe and would consider surgery.    She strangles a lot - feels like there is phlegm in her throat..  She has burning and pulsating pain in right arm and right upper chest, which she thinks is from her neck..   She is taking ambien and getting some sleep.  The neck and head pain is severe and keeps her awake.   Medications and allergies reviewed with patient and updated if appropriate.  Patient Active Problem List   Diagnosis Date Noted  . Hyperlipidemia 06/25/2016  . Occipital neuralgia 05/09/2016  . Trigeminal neuralgia of right side of face 02/08/2016  . Anxiety 07/26/2015  . Chronic neck pain 04/30/2015  . Diabetes ([place]) 04/27/2015  . History of CVA (cerebrovascular accident) 04/26/2015  . History of gout 04/26/2015  . CKD (chronic kidney disease) 04/26/2015  . Essential hypertension, benign 04/26/2015  . Snoring 04/26/2015  .  Localized edema 04/26/2015  . Dysphagia 04/26/2015  . GERD (gastroesophageal reflux disease) 04/26/2015  . Personal history of colonic adenoma 05/29/2007  . HIATAL HERNIA 04/07/2007  . RESTLESS LEG SYNDROME 04/04/2007  . 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 COPD 04/04/2007  . IRRITABLE BOWEL SYNDROME 04/04/2007  . OSTEOARTHRITIS 04/04/2007  . BACK PAIN, CHRONIC 04/04/2007    Current Outpatient Prescriptions on File Prior to Visit  Medication Sig Dispense Refill  . allopurinol (ZYLOPRIM) 300 MG tablet TAKE 1 TABLET (300 MG TOTAL) BY MOUTH DAILY. 90 tablet 0  . amLODipine (NORVASC) 5 MG tablet TAKE 1 TABLET (5 MG TOTAL) BY MOUTH DAILY. 90 tablet 2  . clopidogrel (PLAVIX) 75 MG tablet Take 1 tablet (75 mg total) by mouth daily. 90 tablet 1  . diclofenac (VOLTAREN) 50 MG EC tablet Take 50 mg by mouth 3 (three) times daily.    . DULoxetine (CYMBALTA) 60 MG capsule Take 120 mg by mouth.    . losartan (COZAAR) 100 MG tablet TAKE 1 TABLET (100 MG TOTAL) BY MOUTH DAILY. 90 tablet 3  . metFORMIN (GLUCOPHAGE) 500 MG tablet TAKE 1 TABLET (500 MG TOTAL) BY MOUTH 2 (TWO) TIMES DAILY WITH A MEAL. 180 tablet 0  . metoprolol succinate (TOPROL-XL) 25 MG 24 hr tablet TAKE 1 TABLET (25 MG TOTAL) BY MOUTH DAILY. 90 tablet 2  . pantoprazole (PROTONIX) 40 MG tablet TAKE 1 TABLET BY MOUTH EVERY DAY 90 tablet 3  . rosuvastatin (CRESTOR)  10 MG tablet Take 1 tablet (10 mg total) by mouth daily. 90 tablet 3   No current facility-administered medications on file prior to visit.     Past Medical History:  Diagnosis Date  . Anxiety   . Arthritis   . Cataract    removed both eyes  . COPD (chronic obstructive pulmonary disease) ([place])    pt denies - told this while was smoking -  . GASTRIC ULCER, ACUTE 04/07/2007   Annotation: EGD 04/07/07 Qualifier: Diagnosis of  By: Carlean Purl MD, Dimas Millin   . GERD (gastroesophageal reflux disease)   . Hyperlipidemia   . Hypertension   . Kidney tumor (benign)   . Personal  history of colonic adenoma 06/07/2012  . Stroke [facility])    x2 2011  . TIA (transient ischemic attack)     Past Surgical History:  Procedure Laterality Date  . ABDOMINAL HYSTERECTOMY    . CATARACT EXTRACTION    . COLONOSCOPY    . CYSTOCELE REPAIR    . ESOPHAGOGASTRODUODENOSCOPY    . KIDNEY SURGERY     benign tumor removal  . ORTHOPEDIC SURGERY     Wrist & elbow  . POLYPECTOMY    . RECTOCELE REPAIR    . UPPER GASTROINTESTINAL ENDOSCOPY      Social History   Social History  . Marital status: Married    Spouse name: N/A  . Number of children: 2  . Years of education: N/A   Occupational History  . Retired    Social History Main Topics  . Smoking status: Former Smoker    Quit date: 06/30/2009  . Smokeless tobacco: Never Used  . Alcohol use No  . Drug use: No  . Sexual activity: Not Asked   Other Topics Concern  . None   Social History Narrative   Married, retired   1 son 1 daughter   2 caffeine/day    Family History  Problem Relation Age of Onset  . Congestive Heart Failure Mother   . Cancer Mother        ovarian  . Heart disease Father   . Diabetes Father   . Kidney disease Father   . Diabetes Sister   . Kidney disease Sister   . Colon cancer Neg Hx   . Colon polyps Neg Hx   . Esophageal cancer Neg Hx   . Rectal cancer Neg Hx   . Stomach cancer Neg Hx     Review of Systems  Constitutional: Negative for chills and fever.  Respiratory: Positive for cough, shortness of breath and wheezing.   Cardiovascular: Negative for chest pain, palpitations and leg swelling.  Musculoskeletal: Positive for neck pain (chronic).  Neurological: Positive for headaches (chronic).       Objective:   Vitals:   11/19/16 1513  BP: (!) 144/85  Pulse: 79  Resp: 20  Temp: 97.9 F (36.6 C)  SpO2: 98%   Wt Readings from Last 3 Encounters:  11/19/16 234 lb (106.1 kg)  05/09/16 235 lb (106.6 kg)  02/08/16 239 lb (108.4 kg)   Body mass index is 38.94 kg/m.    Physical Exam    Constitutional: Appears well-developed and well-nourished. No distress.  HENT:  Head: Normocephalic and atraumatic.  Neck: Neck supple. No tracheal deviation present. No thyromegaly present.  No cervical lymphadenopathy Cardiovascular: Normal rate, regular rhythm and normal heart sounds.   No murmur heard. No carotid bruit .  Mild b/l LE non pitting edema Pulmonary/Chest: Effort normal and breath  sounds normal. No respiratory distress. No has no wheezes. No rales.  Skin: Skin is warm and dry. Not diaphoretic.  Psychiatric: Normal mood and affect. Behavior is normal.      Assessment & Plan:    See Problem List for Assessment and Plan of chronic medical problems.

## 2016-11-19 ENCOUNTER — Encounter: Payer: Self-pay | Admitting: Internal Medicine

## 2016-11-19 ENCOUNTER — Other Ambulatory Visit (INDEPENDENT_AMBULATORY_CARE_PROVIDER_SITE_OTHER): Payer: Medicare Other

## 2016-11-19 ENCOUNTER — Ambulatory Visit (INDEPENDENT_AMBULATORY_CARE_PROVIDER_SITE_OTHER): Payer: Medicare Other | Admitting: Internal Medicine

## 2016-11-19 VITALS — BP 144/85 | HR 79 | Temp 97.9°F | Resp 20 | Ht 65.0 in | Wt 234.0 lb

## 2016-11-19 DIAGNOSIS — E119 Type 2 diabetes mellitus without complications: Secondary | ICD-10-CM | POA: Diagnosis not present

## 2016-11-19 DIAGNOSIS — R600 Localized edema: Secondary | ICD-10-CM | POA: Diagnosis not present

## 2016-11-19 DIAGNOSIS — Z8739 Personal history of other diseases of the musculoskeletal system and connective tissue: Secondary | ICD-10-CM

## 2016-11-19 DIAGNOSIS — K219 Gastro-esophageal reflux disease without esophagitis: Secondary | ICD-10-CM | POA: Diagnosis not present

## 2016-11-19 DIAGNOSIS — E7800 Pure hypercholesterolemia, unspecified: Secondary | ICD-10-CM | POA: Diagnosis not present

## 2016-11-19 DIAGNOSIS — Z0000 Encounter for general adult medical examination without abnormal findings: Secondary | ICD-10-CM

## 2016-11-19 DIAGNOSIS — I10 Essential (primary) hypertension: Secondary | ICD-10-CM | POA: Diagnosis not present

## 2016-11-19 DIAGNOSIS — Z8673 Personal history of transient ischemic attack (TIA), and cerebral infarction without residual deficits: Secondary | ICD-10-CM | POA: Diagnosis not present

## 2016-11-19 LAB — CBC WITH DIFFERENTIAL/PLATELET
Basophils Absolute: 0.1 10*3/uL (ref 0.0–0.1)
Basophils Relative: 0.5 % (ref 0.0–3.0)
Eosinophils Absolute: 0.5 10*3/uL (ref 0.0–0.7)
Eosinophils Relative: 4.4 % (ref 0.0–5.0)
HCT: 42.3 % (ref 36.0–46.0)
Hemoglobin: 13.6 g/dL (ref 12.0–15.0)
Lymphocytes Relative: 28.5 % (ref 12.0–46.0)
Lymphs Abs: 3.5 10*3/uL (ref 0.7–4.0)
MCHC: 32.1 g/dL (ref 30.0–36.0)
MCV: 91.8 fl (ref 78.0–100.0)
Monocytes Absolute: 1 10*3/uL (ref 0.1–1.0)
Monocytes Relative: 7.7 % (ref 3.0–12.0)
Neutro Abs: 7.2 10*3/uL (ref 1.4–7.7)
Neutrophils Relative %: 58.9 % (ref 43.0–77.0)
Platelets: 323 10*3/uL (ref 150.0–400.0)
RBC: 4.61 Mil/uL (ref 3.87–5.11)
RDW: 15.1 % (ref 11.5–15.5)
WBC: 12.3 10*3/uL — ABNORMAL HIGH (ref 4.0–10.5)

## 2016-11-19 LAB — COMPREHENSIVE METABOLIC PANEL
ALT: 14 U/L (ref 0–35)
AST: 15 U/L (ref 0–37)
Albumin: 4.5 g/dL (ref 3.5–5.2)
Alkaline Phosphatase: 109 U/L (ref 39–117)
BUN: 21 mg/dL (ref 6–23)
CO2: 29 mEq/L (ref 19–32)
Calcium: 9.6 mg/dL (ref 8.4–10.5)
Chloride: 100 mEq/L (ref 96–112)
Creatinine, Ser: 1.12 mg/dL (ref 0.40–1.20)
GFR: 50.87 mL/min — ABNORMAL LOW (ref >60.00)
Glucose, Bld: 106 mg/dL — ABNORMAL HIGH (ref 70–99)
Potassium: 4.2 mEq/L (ref 3.5–5.1)
Sodium: 139 mEq/L (ref 135–145)
Total Bilirubin: 0.5 mg/dL (ref 0.2–1.2)
Total Protein: 7.6 g/dL (ref 6.0–8.3)

## 2016-11-19 MED ORDER — FUROSEMIDE 20 MG PO TABS
20.0000 mg | ORAL_TABLET | ORAL | 5 refills | Status: DC
Start: 2016-11-19 — End: 2017-12-11

## 2016-11-19 MED ORDER — BUTALBITAL-ACETAMINOPHEN 50-325 MG PO TABS: ORAL_TABLET | ORAL | 2 refills | Status: DC

## 2016-11-19 NOTE — Assessment & Plan Note (Addendum)
BP Readings from Last 3 Encounters:  11/19/16 (!) 144/85  05/09/16 126/78  02/08/16 (!) 142/90   Continue current medications cmp

## 2016-11-19 NOTE — Assessment & Plan Note (Signed)
Continue plavix and statin

## 2016-11-19 NOTE — Assessment & Plan Note (Signed)
Check a1c Unable to exercise due to severe neck pain Foot exam done today

## 2016-11-19 NOTE — Assessment & Plan Note (Signed)
GERD controlled Continue daily medication  

## 2016-11-19 NOTE — Assessment & Plan Note (Signed)
Taking lasix as needed - has not been taking it recently

## 2016-11-19 NOTE — Assessment & Plan Note (Signed)
Continue crestor Will check lipid panel at next visit - will consider increasing crestor to 20 mg

## 2016-11-19 NOTE — Patient Instructions (Addendum)
Dr. Katy Fitch, Dr. Bing Plume, eye doctors  Continue doing brain stimulating activities (puzzles, reading, adult coloring books, staying active) to keep memory sharp.   Continue to eat heart healthy diet (full of fruits, vegetables, whole grains, lean protein, water--limit salt, fat, and sugar intake) and increase physical activity as tolerated.   Kerry Powell , Thank you for taking time to come for your Medicare Wellness Visit. I appreciate your ongoing commitment to your health goals. Please review the following plan we discussed and let me know if I can assist you in the future.   These are the goals we discussed: Goals    . stay as healthy and as independent as possible       This is a list of the screening recommended for you and due dates:  Health Maintenance  Topic Date Due  . Complete foot exam   03/16/1955  . Eye exam for diabetics  03/16/1955  . Tetanus Vaccine  03/15/1964  . Mammogram  03/16/1995  . DEXA scan (bone density measurement)  03/15/2010  . Hemoglobin A1C  11/09/2016  . Pneumonia vaccines (1 of 2 - PCV13) 02/26/2017*  . Flu Shot  05/26/2017*  . Colon Cancer Screening  04/06/2017  .  Hepatitis C: One time screening is recommended by Center for Disease Control  (CDC) for  adults born from [birth year] through 1965.   Completed  *Topic was postponed. The date shown is not the original due date.    Test(s) ordered today. Your results will be released to [facility] (or called to you) after review, usually within 72hours after test completion. If any changes need to be made, you will be notified at that same time.   Medications reviewed and updated.  Changes include continuing the lasix as needed.   Your prescription(s) have been submitted to your pharmacy. Please take as directed and contact our office if you believe you are having problem(s) with the medication(s).    Please followup in 6 months

## 2016-11-19 NOTE — Assessment & Plan Note (Signed)
No gout flares Continue allopurinol at current dose

## 2016-11-20 LAB — HEMOGLOBIN A1C: Hgb A1c MFr Bld: 7.6 % — ABNORMAL HIGH (ref 4.6–6.5)

## 2016-11-22 ENCOUNTER — Other Ambulatory Visit: Payer: Self-pay | Admitting: Emergency Medicine

## 2016-11-22 MED ORDER — METFORMIN HCL 1000 MG PO TABS: 1000.0000 mg | ORAL_TABLET | Freq: Two times a day (BID) | ORAL | 1 refills | Status: DC

## 2016-11-29 ENCOUNTER — Encounter: Payer: Self-pay | Admitting: Internal Medicine

## 2016-11-30 ENCOUNTER — Other Ambulatory Visit: Payer: Self-pay | Admitting: Internal Medicine

## 2017-01-09 ENCOUNTER — Encounter: Payer: Self-pay | Admitting: Internal Medicine

## 2017-01-12 ENCOUNTER — Other Ambulatory Visit: Payer: Self-pay | Admitting: Internal Medicine

## 2017-01-18 ENCOUNTER — Other Ambulatory Visit: Payer: Self-pay | Admitting: Internal Medicine

## 2017-01-21 ENCOUNTER — Other Ambulatory Visit: Payer: Self-pay | Admitting: Internal Medicine

## 2017-01-21 ENCOUNTER — Encounter: Payer: Self-pay | Admitting: Internal Medicine

## 2017-01-23 ENCOUNTER — Other Ambulatory Visit: Payer: Self-pay | Admitting: Emergency Medicine

## 2017-01-23 MED ORDER — METFORMIN HCL 1000 MG PO TABS: 1000.0000 mg | ORAL_TABLET | Freq: Two times a day (BID) | ORAL | 1 refills | Status: DC

## 2017-02-04 ENCOUNTER — Other Ambulatory Visit: Payer: Self-pay | Admitting: Internal Medicine

## 2017-04-01 ENCOUNTER — Other Ambulatory Visit: Payer: Self-pay | Admitting: Internal Medicine

## 2017-04-01 NOTE — Telephone Encounter (Signed)
Last refill sent 11/19/16 #60 with 2 refills

## 2017-04-04 ENCOUNTER — Other Ambulatory Visit (HOSPITAL_COMMUNITY): Payer: Self-pay | Admitting: Urology

## 2017-04-04 ENCOUNTER — Ambulatory Visit (HOSPITAL_COMMUNITY)
Admission: RE | Admit: 2017-04-04 | Discharge: 2017-04-04 | Disposition: A | Discharge status: Home / Self Care | Payer: Medicare Other | Source: Ambulatory Visit | Attending: Urology | Admitting: Urology

## 2017-04-04 DIAGNOSIS — Z85528 Personal history of other malignant neoplasm of kidney: Secondary | ICD-10-CM | POA: Diagnosis present

## 2017-04-15 ENCOUNTER — Telehealth: Payer: Self-pay | Admitting: Emergency Medicine

## 2017-04-15 DIAGNOSIS — E119 Type 2 diabetes mellitus without complications: Secondary | ICD-10-CM

## 2017-04-15 DIAGNOSIS — I10 Essential (primary) hypertension: Secondary | ICD-10-CM

## 2017-04-15 DIAGNOSIS — E785 Hyperlipidemia, unspecified: Secondary | ICD-10-CM

## 2017-04-15 NOTE — Telephone Encounter (Signed)
LVM for pt to call back and discuss.  

## 2017-04-15 NOTE — Telephone Encounter (Signed)
Received fax from pharmacy asking if pt could switch from rosuvastatin to simvastatin due to cost.

## 2017-04-15 NOTE — Telephone Encounter (Signed)
I would prefer lipitor  - 20 mg daily.  If ok with patient.

## 2017-04-16 NOTE — Telephone Encounter (Signed)
Spoke with pt, she is okay with staying on the Crestor until her appt. She would like to discuss decreasing the amount of meds that she was on. She would like to have her blood work done before the visit. Advsied pt to work on diet and exercise to help with sugars and cholesterol.

## 2017-05-17 ENCOUNTER — Other Ambulatory Visit (INDEPENDENT_AMBULATORY_CARE_PROVIDER_SITE_OTHER): Payer: Medicare Other

## 2017-05-17 DIAGNOSIS — E785 Hyperlipidemia, unspecified: Secondary | ICD-10-CM | POA: Diagnosis not present

## 2017-05-17 DIAGNOSIS — E119 Type 2 diabetes mellitus without complications: Secondary | ICD-10-CM | POA: Diagnosis not present

## 2017-05-17 DIAGNOSIS — I10 Essential (primary) hypertension: Secondary | ICD-10-CM | POA: Diagnosis not present

## 2017-05-17 LAB — CBC WITH DIFFERENTIAL/PLATELET
Basophils Absolute: 0.1 10*3/uL (ref 0.0–0.1)
Basophils Relative: 0.8 % (ref 0.0–3.0)
Eosinophils Absolute: 0.3 10*3/uL (ref 0.0–0.7)
Eosinophils Relative: 3.3 % (ref 0.0–5.0)
HCT: 40.5 % (ref 36.0–46.0)
Hemoglobin: 13.2 g/dL (ref 12.0–15.0)
Lymphocytes Relative: 26.6 % (ref 12.0–46.0)
Lymphs Abs: 2.5 10*3/uL (ref 0.7–4.0)
MCHC: 32.5 g/dL (ref 30.0–36.0)
MCV: 91.5 fl (ref 78.0–100.0)
Monocytes Absolute: 0.7 10*3/uL (ref 0.1–1.0)
Monocytes Relative: 7 % (ref 3.0–12.0)
Neutro Abs: 5.9 10*3/uL (ref 1.4–7.7)
Neutrophils Relative %: 62.3 % (ref 43.0–77.0)
Platelets: 338 10*3/uL (ref 150.0–400.0)
RBC: 4.42 Mil/uL (ref 3.87–5.11)
RDW: 14.5 % (ref 11.5–15.5)
WBC: 9.5 10*3/uL (ref 4.0–10.5)

## 2017-05-17 LAB — LIPID PANEL
Cholesterol: 154 mg/dL (ref 0–200)
HDL: 45.2 mg/dL (ref >39.00)
LDL Cholesterol: 83 mg/dL (ref 0–99)
NonHDL: 109.03
Total CHOL/HDL Ratio: 3
Triglycerides: 129 mg/dL (ref 0.0–149.0)
VLDL: 25.8 mg/dL (ref 0.0–40.0)

## 2017-05-17 LAB — COMPREHENSIVE METABOLIC PANEL
ALT: 16 U/L (ref 0–35)
AST: 15 U/L (ref 0–37)
Albumin: 4.2 g/dL (ref 3.5–5.2)
Alkaline Phosphatase: 102 U/L (ref 39–117)
BUN: 24 mg/dL — ABNORMAL HIGH (ref 6–23)
CO2: 27 mEq/L (ref 19–32)
Calcium: 9.6 mg/dL (ref 8.4–10.5)
Chloride: 102 mEq/L (ref 96–112)
Creatinine, Ser: 1.41 mg/dL — ABNORMAL HIGH (ref 0.40–1.20)
GFR: 38.95 mL/min — ABNORMAL LOW (ref >60.00)
Glucose, Bld: 135 mg/dL — ABNORMAL HIGH (ref 70–99)
Potassium: 4.2 mEq/L (ref 3.5–5.1)
Sodium: 140 mEq/L (ref 135–145)
Total Bilirubin: 0.4 mg/dL (ref 0.2–1.2)
Total Protein: 7.3 g/dL (ref 6.0–8.3)

## 2017-05-17 LAB — TSH: TSH: 3.5 u[IU]/mL (ref 0.35–4.50)

## 2017-05-19 NOTE — Progress Notes (Signed)
Subjective:    Patient ID: Kerry Powell, female    DOB: 04/01/1945, 72 y.o.   MRN: [id number]  HPI The patient is here for follow up.  Chronic neck pain/head:  She had two injections in her head and she has been pain free for two months.   She has been taking the diclofenac daily - she is concerned the headaches will come back.  She is also taking cymbalta daily.  She takes acetaminophen-butalbital only as needed.   Hypertension: She is taking her medication daily. She is compliant with a low sodium diet.  She denies chest pain, palpitations, edema, shortness of breath and regular headaches. She is not exercising regularly.  She does not monitor her blood pressure at home.    Diabetes: She is taking her medication daily as prescribed. She is compliant with a diabetic diet. She is not exercising regularly.       GERD:  She is taking her medication daily as prescribed.  She denies any GERD symptoms and feels her GERD is well controlled.   Hyperlipidemia: She is taking her medication daily. She is compliant with a low fat/cholesterol diet. She is not exercising regularly. She denies myalgias.   Gout;  She is taking allopurinol daily.  She denies any gout symptoms.    She just saw urology.  No evidence of kidney tumor.   Medications and allergies reviewed with patient and updated if appropriate.  Patient Active Problem List   Diagnosis Date Noted  . Hyperlipidemia 06/25/2016  . Occipital neuralgia 05/09/2016  . Trigeminal neuralgia of right side of face 02/08/2016  . Anxiety 07/26/2015  . Chronic neck pain 04/30/2015  . Diabetes ([place]) 04/27/2015  . History of CVA (cerebrovascular accident) 04/26/2015  . History of gout 04/26/2015  . CKD (chronic kidney disease) 04/26/2015  . Essential hypertension, benign 04/26/2015  . Snoring 04/26/2015  . Localized edema 04/26/2015  . Dysphagia 04/26/2015  . GERD (gastroesophageal reflux disease) 04/26/2015  . Personal history of colonic  adenoma 05/29/2007  . HIATAL HERNIA 04/07/2007  . RESTLESS LEG SYNDROME 04/04/2007  . 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 COPD 04/04/2007  . IRRITABLE BOWEL SYNDROME 04/04/2007  . OSTEOARTHRITIS 04/04/2007  . BACK PAIN, CHRONIC 04/04/2007    Current Outpatient Medications on File Prior to Visit  Medication Sig Dispense Refill  . ACETAMINOPHEN-BUTALBITAL 50-325 MG TABS TAKE 1 TABLET BY MOUTH EVERY DAY AS NEEDED 30 each 0  . allopurinol (ZYLOPRIM) 300 MG tablet TAKE 1 TABLET BY MOUTH EVERY DAY 90 tablet 1  . amLODipine (NORVASC) 5 MG tablet TAKE 1 TABLET (5 MG TOTAL) BY MOUTH DAILY. 90 tablet 3  . clopidogrel (PLAVIX) 75 MG tablet TAKE 1 TABLET BY MOUTH EVERY DAY 90 tablet 1  . diclofenac (VOLTAREN) 50 MG EC tablet Take 50 mg by mouth 3 (three) times daily.    . DULoxetine (CYMBALTA) 60 MG capsule Take 120 mg by mouth.    . furosemide (LASIX) 20 MG tablet Take 1 tablet (20 mg total) by mouth every 3 (three) days. 30 tablet 5  . ketoconazole (NIZORAL) 2 % cream APPLY TO AFFECTED AREA UP TO TWICE A DAY AS NEEDED    . losartan (COZAAR) 100 MG tablet TAKE 1 TABLET (100 MG TOTAL) BY MOUTH DAILY. 90 tablet 3  . metFORMIN (GLUCOPHAGE) 1000 MG tablet Take 1 tablet (1,000 mg total) by mouth 2 (two) times daily with a meal. 180 tablet 1  . metoprolol succinate (TOPROL-XL) 25 MG 24 hr tablet  TAKE 1 TABLET (25 MG TOTAL) BY MOUTH DAILY. 90 tablet 1  . omeprazole (PRILOSEC) 20 MG capsule Take 20 mg by mouth daily.    . pantoprazole (PROTONIX) 40 MG tablet TAKE 1 TABLET BY MOUTH EVERY DAY 90 tablet 1  . rosuvastatin (CRESTOR) 10 MG tablet Take 1 tablet (10 mg total) by mouth daily. 90 tablet 3  . zolpidem (AMBIEN) 10 MG tablet 1 po 1 hour before bedtime     No current facility-administered medications on file prior to visit.     Past Medical History:  Diagnosis Date  . Anxiety   . Arthritis   . Cataract    removed both eyes  . COPD (chronic obstructive pulmonary disease) ([place])    pt denies -  told this while was smoking -  . GASTRIC ULCER, ACUTE 04/07/2007   Annotation: EGD 04/07/07 Qualifier: Diagnosis of  By: Carlean Purl MD, Dimas Millin   . GERD (gastroesophageal reflux disease)   . Hyperlipidemia   . Hypertension   . Kidney tumor (benign)   . Personal history of colonic adenoma 06/07/2012  . Stroke [facility])    x2 2011  . TIA (transient ischemic attack)     Past Surgical History:  Procedure Laterality Date  . ABDOMINAL HYSTERECTOMY    . CATARACT EXTRACTION    . COLONOSCOPY    . CYSTOCELE REPAIR    . ESOPHAGOGASTRODUODENOSCOPY    . KIDNEY SURGERY     benign tumor removal  . ORTHOPEDIC SURGERY     Wrist & elbow  . POLYPECTOMY    . RECTOCELE REPAIR    . UPPER GASTROINTESTINAL ENDOSCOPY      Social History   Socioeconomic History  . Marital status: Married    Spouse name: Not on file  . Number of children: 2  . Years of education: Not on file  . Highest education level: Not on file  Occupational History  . Occupation: Retired  Scientific laboratory technician  . Financial resource strain: Not on file  . Food insecurity:    Worry: Not on file    Inability: Not on file  . Transportation needs:    Medical: Not on file    Non-medical: Not on file  Tobacco Use  . Smoking status: Former Smoker    Last attempt to quit: 06/30/2009    Years since quitting: 7.8  . Smokeless tobacco: Never Used  Substance and Sexual Activity  . Alcohol use: No  . Drug use: No  . Sexual activity: Not on file  Lifestyle  . Physical activity:    Days per week: Not on file    Minutes per session: Not on file  . Stress: Not on file  Relationships  . Social connections:    Talks on phone: Not on file    Gets together: Not on file    Attends religious service: Not on file    Active member of club or organization: Not on file    Attends meetings of clubs or organizations: Not on file    Relationship status: Not on file  Other Topics Concern  . Not on file  Social History Narrative   Married, retired    1 son 1 daughter   2 caffeine/day    Family History  Problem Relation Age of Onset  . Congestive Heart Failure Mother   . Cancer Mother        ovarian  . Heart disease Father   . Diabetes Father   . Kidney disease Father   .  Diabetes Sister   . Kidney disease Sister   . Colon cancer Neg Hx   . Colon polyps Neg Hx   . Esophageal cancer Neg Hx   . Rectal cancer Neg Hx   . Stomach cancer Neg Hx     Review of Systems  Constitutional: Negative for chills and fever.  Respiratory: Positive for shortness of breath (with stairs or lifting ). Negative for cough and wheezing.   Cardiovascular: Negative for chest pain, palpitations and leg swelling.  Neurological: Negative for light-headedness and headaches.       Objective:   Vitals:   05/20/17 1536  BP: (!) 150/94  Pulse: 84  Resp: 16  Temp: 98.3 F (36.8 C)  SpO2: 91%   BP Readings from Last 3 Encounters:  05/20/17 (!) 150/94  11/19/16 (!) 144/85  05/09/16 126/78   Wt Readings from Last 3 Encounters:  05/20/17 229 lb (103.9 kg)  11/19/16 234 lb (106.1 kg)  05/09/16 235 lb (106.6 kg)   Body mass index is 38.11 kg/m.   Physical Exam    Constitutional: Appears well-developed and well-nourished. No distress.  HENT:  Head: Normocephalic and atraumatic.  Neck: Neck supple. No tracheal deviation present. No thyromegaly present.  No cervical lymphadenopathy Cardiovascular: Normal rate, regular rhythm and normal heart sounds.   2/6 systolic  murmur heard. No carotid bruit .  No edema Pulmonary/Chest: Effort normal and breath sounds normal. No respiratory distress. No has no wheezes. No rales.  Skin: Skin is warm and dry. Not diaphoretic.  Psychiatric: Normal mood and affect. Behavior is normal.      Assessment & Plan:    See Problem List for Assessment and Plan of chronic medical problems.

## 2017-05-19 NOTE — Patient Instructions (Addendum)
  Test(s) ordered today. Your results will be released to [facility] (or called to you) after review, usually within 72hours after test completion. If any changes need to be made, you will be notified at that same time.   Medications reviewed and updated.  Changes include stopping losartan and starting telmisartan.  Increase amlodipine to 7.5 mg daily.   Your prescription(s) have been submitted to your pharmacy. Please take as directed and contact our office if you believe you are having problem(s) with the medication(s).    Please followup in 3 weeks

## 2017-05-20 ENCOUNTER — Encounter: Payer: Self-pay | Admitting: Internal Medicine

## 2017-05-20 ENCOUNTER — Other Ambulatory Visit (INDEPENDENT_AMBULATORY_CARE_PROVIDER_SITE_OTHER): Payer: Medicare Other

## 2017-05-20 ENCOUNTER — Ambulatory Visit: Payer: Medicare Other | Admitting: Internal Medicine

## 2017-05-20 VITALS — BP 150/94 | HR 84 | Temp 98.3°F | Resp 16 | Wt 229.0 lb

## 2017-05-20 DIAGNOSIS — G8929 Other chronic pain: Secondary | ICD-10-CM

## 2017-05-20 DIAGNOSIS — N189 Chronic kidney disease, unspecified: Secondary | ICD-10-CM

## 2017-05-20 DIAGNOSIS — Z8739 Personal history of other diseases of the musculoskeletal system and connective tissue: Secondary | ICD-10-CM | POA: Diagnosis not present

## 2017-05-20 DIAGNOSIS — M542 Cervicalgia: Secondary | ICD-10-CM | POA: Diagnosis not present

## 2017-05-20 DIAGNOSIS — E119 Type 2 diabetes mellitus without complications: Secondary | ICD-10-CM

## 2017-05-20 DIAGNOSIS — K219 Gastro-esophageal reflux disease without esophagitis: Secondary | ICD-10-CM

## 2017-05-20 DIAGNOSIS — I10 Essential (primary) hypertension: Secondary | ICD-10-CM

## 2017-05-20 LAB — HEMOGLOBIN A1C: Hgb A1c MFr Bld: 7.4 % — ABNORMAL HIGH (ref 4.6–6.5)

## 2017-05-20 MED ORDER — AMLODIPINE BESYLATE 5 MG PO TABS: 7.5000 mg | ORAL_TABLET | Freq: Every day | ORAL | 3 refills | Status: DC

## 2017-05-20 MED ORDER — TELMISARTAN 80 MG PO TABS: 80.0000 mg | ORAL_TABLET | Freq: Every day | ORAL | 3 refills | Status: DC

## 2017-05-20 NOTE — Assessment & Plan Note (Signed)
Controlled, no gout symptoms Continue allopurinol daily

## 2017-05-20 NOTE — Assessment & Plan Note (Signed)
GERD controlled Continue daily medication  

## 2017-05-20 NOTE — Assessment & Plan Note (Signed)
a1c not done  - will add to bloodwork Will dec metformin if possible cmp in 3 weeks stressed starting exercise

## 2017-05-20 NOTE — Assessment & Plan Note (Addendum)
Not controlled BP at home 165, 170/90's Stop losartan given recalls - start telmisartan 80 mg daily Increase amlodipine to 7.5 mg daily Recheck cmp at next visit F/u in 3 weeks

## 2017-05-20 NOTE — Assessment & Plan Note (Signed)
Chronic neck and posterior head pain Pain improved with injections Now no pain  Still taking cymbalta - continue Taking diclofenac - no pain -- but concerned pain will come back Advised to d/c diclofenac due to worsening of CKD

## 2017-05-20 NOTE — Assessment & Plan Note (Signed)
Worsening of CKD - ? Due to diclofenac, ? Uncontrolled BP Stop diclofenac cmp in 3 weeks May need to revise other medications Stressed starting exercise to limit other medications May need nephrology referral

## 2017-05-21 ENCOUNTER — Encounter: Payer: Self-pay | Admitting: Internal Medicine

## 2017-05-22 ENCOUNTER — Encounter: Payer: Self-pay | Admitting: Internal Medicine

## 2017-05-22 ENCOUNTER — Other Ambulatory Visit: Payer: Self-pay | Admitting: Internal Medicine

## 2017-05-23 ENCOUNTER — Encounter: Payer: Self-pay | Admitting: Internal Medicine

## 2017-05-23 DIAGNOSIS — Z85528 Personal history of other malignant neoplasm of kidney: Secondary | ICD-10-CM | POA: Insufficient documentation

## 2017-05-23 HISTORY — DX: Personal history of other malignant neoplasm of kidney: Z85.528

## 2017-05-30 ENCOUNTER — Ambulatory Visit: Payer: Self-pay | Admitting: *Deleted

## 2017-05-30 NOTE — Telephone Encounter (Signed)
Pt  Has   Symptoms   Of  dizziness  With  Loose  Stools  And   Nausea  As  Well .  Pt is  Awake  And  Alert  Denies  Any  Numbness    Or  Slurred  Speech . Appointment  Made  With  Jodi Mourning  For tomorrow  Am. Advised  Not to  Drive   And  Call or proceed  To [place] if any  Increase  In  Symptoms   Reason for Disposition . [1] MODERATE dizziness (e.g., interferes with normal activities) AND [2] has been evaluated by physician for this  Answer Assessment - Initial Assessment Questions 1. DESCRIPTION: "Describe your dizziness."       Feels  Like  Balance  Is  Off    2. LIGHTHEADED: "Do you feel lightheaded?" (e.g., somewhat faint, woozy, weak upon standing)       Weak    Upon  Standing    3. VERTIGO: "Do you feel like either you or the room is spinning or tilting?" (i.e. vertigo)         Unbalanced    4. SEVERITY: "How bad is it?"  "Do you feel like you are going to faint?" "Can you stand and walk?"   - MILD - walking normally   - MODERATE - interferes with normal activities (e.g., work, school)    - SEVERE - unable to stand, requires support to walk, feels like passing out now.       Moderate   5. ONSET:  "When did the dizziness begin?"        4  Days  Ago  6. AGGRAVATING FACTORS: "Does anything make it worse?" (e.g., standing, change in head position)       Changing  In  Head  position -gives  Fluttering  Sensation   7. HEART RATE: "Can you tell me your heart rate?" "How many beats in 15 seconds?"  (Note: not all patients can do this)         76      158/103    8. CAUSE: "What do you think is causing the dizziness?"      Possibly  bp   9. RECURRENT SYMPTOM: "Have you had dizziness before?" If so, ask: "When was the last time?" "What happened that time?"        20  Years   Ago   Had neurological   Issues   10. OTHER SYMPTOMS: "Do you have any other symptoms?" (e.g., fever, chest pain, vomiting, diarrhea, bleeding)          Pt  Had  Vomiting x  1  And   Frequent loose  Stools  X 4  Days   With  Waves  Of  Nausea    11. PREGNANCY: "Is there any chance you are pregnant?" "When was your last menstrual period?"      n/a  Protocols used: DIZZINESS [facility]

## 2017-05-31 ENCOUNTER — Encounter: Payer: Self-pay | Admitting: Family

## 2017-05-31 ENCOUNTER — Ambulatory Visit: Payer: Medicare Other | Admitting: Family

## 2017-05-31 VITALS — BP 138/90 | HR 80 | Temp 98.1°F | Ht 65.0 in | Wt 228.1 lb

## 2017-05-31 DIAGNOSIS — R42 Dizziness and giddiness: Secondary | ICD-10-CM | POA: Diagnosis not present

## 2017-05-31 MED ORDER — MECLIZINE HCL 25 MG PO TABS: 25.0000 mg | ORAL_TABLET | Freq: Three times a day (TID) | ORAL | 0 refills | Status: DC | PRN

## 2017-05-31 MED ORDER — CEFDINIR 300 MG PO CAPS: 300.0000 mg | ORAL_CAPSULE | Freq: Two times a day (BID) | ORAL | 0 refills | Status: DC

## 2017-05-31 MED ORDER — PROMETHAZINE HCL 25 MG PO TABS
25.0000 mg | ORAL_TABLET | Freq: Three times a day (TID) | ORAL | 0 refills | Status: DC | PRN
Start: 2017-05-31 — End: 2017-06-10

## 2017-05-31 NOTE — Progress Notes (Signed)
Kerry Powell is a 72 y.o. female with the following history as recorded in [employer]:  Patient Active Problem List   Diagnosis Date Noted  . H/O renal cell carcinoma 05/23/2017  . Hyperlipidemia 06/25/2016  . Occipital neuralgia 05/09/2016  . Trigeminal neuralgia of right side of face 02/08/2016  . Anxiety 07/26/2015  . Chronic neck pain 04/30/2015  . Diabetes ([place]) 04/27/2015  . History of CVA (cerebrovascular accident) 04/26/2015  . History of gout 04/26/2015  . CKD (chronic kidney disease) 04/26/2015  . Essential hypertension, benign 04/26/2015  . Snoring 04/26/2015  . Localized edema 04/26/2015  . Dysphagia 04/26/2015  . GERD (gastroesophageal reflux disease) 04/26/2015  . Personal history of colonic adenoma 05/29/2007  . HIATAL HERNIA 04/07/2007  . RESTLESS LEG SYNDROME 04/04/2007  . 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 COPD 04/04/2007  . IRRITABLE BOWEL SYNDROME 04/04/2007  . OSTEOARTHRITIS 04/04/2007  . BACK PAIN, CHRONIC 04/04/2007    Current Outpatient Medications  Medication Sig Dispense Refill  . ACETAMINOPHEN-BUTALBITAL 50-325 MG TABS TAKE 1 TABLET BY MOUTH EVERY DAY AS NEEDED 30 each 0  . allopurinol (ZYLOPRIM) 300 MG tablet TAKE 1 TABLET BY MOUTH EVERY DAY 90 tablet 1  . amLODipine (NORVASC) 5 MG tablet Take 1.5 tablets (7.5 mg total) by mouth daily. 135 tablet 3  . clopidogrel (PLAVIX) 75 MG tablet TAKE 1 TABLET BY MOUTH EVERY DAY 90 tablet 1  . cycloSPORINE (RESTASIS MULTIDOSE) 0.05 % ophthalmic emulsion INSTILL 1 DROP INTO BOTH EYES TWICE A DAY    . diclofenac (CATAFLAM) 50 MG tablet Take by mouth.    . diclofenac (VOLTAREN) 50 MG EC tablet Take 50 mg by mouth 3 (three) times daily.    . DULoxetine (CYMBALTA) 60 MG capsule Take 120 mg by mouth.    . fluorometholone (FML FORTE) 0.25 % ophthalmic suspension INSTILL 1 DROP INTO BOTH EYES TWICE A DAY FOR FOUR WEEKS THEN D/C    . furosemide (LASIX) 20 MG tablet Take 1 tablet (20 mg total) by mouth every 3  (three) days. 30 tablet 5  . ketoconazole (NIZORAL) 2 % cream APPLY TO AFFECTED AREA UP TO TWICE A DAY AS NEEDED    . losartan (COZAAR) 100 MG tablet Take by mouth.    . metFORMIN (GLUCOPHAGE) 1000 MG tablet Take 1 tablet (1,000 mg total) by mouth 2 (two) times daily with a meal. 180 tablet 1  . metoprolol succinate (TOPROL-XL) 25 MG 24 hr tablet TAKE 1 TABLET (25 MG TOTAL) BY MOUTH DAILY. 90 tablet 1  . neomycin-polymyxin b-dexamethasone (MAXITROL) 3.5-10000-0.1 OINT APPLY A SMALL AMOUNT INTO LEFT EYE AT BEDTIME    . pantoprazole (PROTONIX) 40 MG tablet TAKE 1 TABLET BY MOUTH EVERY DAY 90 tablet 1  . rosuvastatin (CRESTOR) 10 MG tablet TAKE 1 TABLET (10 MG TOTAL) BY MOUTH DAILY. 90 tablet 3  . telmisartan (MICARDIS) 80 MG tablet Take 1 tablet (80 mg total) by mouth daily. 90 tablet 3  . zaleplon (SONATA) 5 MG capsule 1-2 po 1 hour before bedtime    . cefdinir (OMNICEF) 300 MG capsule Take 1 capsule (300 mg total) by mouth 2 (two) times daily. 20 capsule 0  . meclizine (ANTIVERT) 25 MG tablet Take 1 tablet (25 mg total) by mouth 3 (three) times daily as needed for dizziness. 30 tablet 0  . promethazine (PHENERGAN) 25 MG tablet Take 1 tablet (25 mg total) by mouth every 8 (eight) hours as needed for nausea or vomiting. 20 tablet 0   No current  facility-administered medications for this visit.     Allergies: Gabapentin; Clindamycin; and Sulfa antibiotics  Past Medical History:  Diagnosis Date  . Anxiety   . Arthritis   . Cataract    removed both eyes  . COPD (chronic obstructive pulmonary disease) ([place])    pt denies - told this while was smoking -  . GASTRIC ULCER, ACUTE 04/07/2007   Annotation: EGD 04/07/07 Qualifier: Diagnosis of  By: Carlean Purl MD, Dimas Millin   . GERD (gastroesophageal reflux disease)   . Hyperlipidemia   . Hypertension   . Kidney tumor (benign)   . Personal history of colonic adenoma 06/07/2012  . Stroke [facility])    x2 2011  . TIA (transient ischemic attack)     Past  Surgical History:  Procedure Laterality Date  . ABDOMINAL HYSTERECTOMY    . CATARACT EXTRACTION    . COLONOSCOPY    . CYSTOCELE REPAIR    . ESOPHAGOGASTRODUODENOSCOPY    . KIDNEY SURGERY     benign tumor removal  . ORTHOPEDIC SURGERY     Wrist & elbow  . POLYPECTOMY    . RECTOCELE REPAIR    . UPPER GASTROINTESTINAL ENDOSCOPY      Family History  Problem Relation Age of Onset  . Congestive Heart Failure Mother   . Cancer Mother        ovarian  . Heart disease Father   . Diabetes Father   . Kidney disease Father   . Diabetes Sister   . Kidney disease Sister   . Colon cancer Neg Hx   . Colon polyps Neg Hx   . Esophageal cancer Neg Hx   . Rectal cancer Neg Hx   . Stomach cancer Neg Hx     Social History   Tobacco Use  . Smoking status: Former Smoker    Last attempt to quit: 06/30/2009    Years since quitting: 7.9  . Smokeless tobacco: Never Used  Substance Use Topics  . Alcohol use: No    Subjective:  Patient presents with concerns for dizziness x 4 days; notes that she feels like she is having "waves of dizziness." No history of vertigo; no vomiting but has felt nausea; + loose stools; notes that she feels hot/ overheated- blood pressure was up on one occasion earlier this week; denies any chest pain on exertion; notes she is very tired and irritable; has tried OTC Mucinex/ Sin-ex; feels pressure in her head/ behind her eyes; no blurred vision; has noticed metallic taste in her mouth in the past 1-2 weeks;    Objective:  Vitals:   05/31/17 0857  BP: 138/90  Pulse: 80  Temp: 98.1 F (36.7 C)  TempSrc: Oral  SpO2: 98%  Weight: 228 lb 1.3 oz (103.5 kg)  Height: 5\' 5"  (1.651 m)    General: Well developed, well nourished, in no acute distress  Skin : Warm and dry.  Head: Normocephalic and atraumatic  Eyes: Sclera and conjunctiva clear; pupils round and reactive to light; extraocular movements intact  Ears: External normal; canals clear; tympanic membranes normal   Oropharynx: Pink, supple. No suspicious lesions  Neck: Supple without thyromegaly, adenopathy  Lungs: Respirations unlabored; clear to auscultation bilaterally without wheeze, rales, rhonchi  CVS exam: normal rate and regular rhythm.  Musculoskeletal: No deformities; no active joint inflammation  Extremities: No edema, cyanosis, clubbing  Vessels: Symmetric bilaterally  Neurologic: Alert and oriented; speech intact; face symmetrical; moves all extremities well; CNII-XII intact without focal deficit   Assessment:  1. Dizziness     Plan:  Suspect vertigo/ underlying sinus/ inner ear infection; EKG shows NSR; reassurance provided; will treat with Antivert 25 mg tid prn; continue Flonase as well; Rx for Omnicef 300 mg bid x 10 days; follow-up worse, no better- if symptoms persist, will need to get imaging done. Strict ER precautions for upcoming weekend;   No follow-ups on file.  Orders Placed This Encounter  Procedures  . EKG 12-Lead    Requested Prescriptions   Signed Prescriptions Disp Refills  . meclizine (ANTIVERT) 25 MG tablet 30 tablet 0    Sig: Take 1 tablet (25 mg total) by mouth 3 (three) times daily as needed for dizziness.  . promethazine (PHENERGAN) 25 MG tablet 20 tablet 0    Sig: Take 1 tablet (25 mg total) by mouth every 8 (eight) hours as needed for nausea or vomiting.  . cefdinir (OMNICEF) 300 MG capsule 20 capsule 0    Sig: Take 1 capsule (300 mg total) by mouth 2 (two) times daily.

## 2017-05-31 NOTE — Patient Instructions (Signed)

## 2017-06-03 ENCOUNTER — Encounter: Payer: Self-pay | Admitting: Family

## 2017-06-03 DIAGNOSIS — R29818 Other symptoms and signs involving the nervous system: Secondary | ICD-10-CM

## 2017-06-04 ENCOUNTER — Encounter: Payer: Self-pay | Admitting: Internal Medicine

## 2017-06-04 NOTE — Telephone Encounter (Signed)
Please call her and let her know that I got her e-mail. I am sorry that she is feeling so badly. I am worried about the nature of these symptoms and her lack of response to the Antivert; it is probably best for her to go on and go to the ER- they can get a picture of her brain done very quickly. If she refuses ER, I am happy to get MRI in the outpatient setting but it may take a few days to get this done.Let me know her response.

## 2017-06-04 NOTE — Telephone Encounter (Signed)
Spoke with patient today. Info given to her but she didn't want to go to the ER to wait all day. She said she didn't pick up her antibiotic because pharmacists told her it could cause diarrhea  for up to a week. She then wanted to know if you felt the Antivert might be causing your symptoms. She stated she has an apt for labs on this Friday and apt to see Dr. Quay Burow on Monday. She wanted to know if you could possibly consult with Dr. Quay Burow when you were back in the office and get her thoughts regarding a plan and me follow up with her tomorrow. So as of now hold off on MRI scan and ER visit until she hears back from [place] (after you talk to Dr. Quay Burow)

## 2017-06-05 MED ORDER — DIAZEPAM 5 MG PO TABS: 5.0000 mg | ORAL_TABLET | Freq: Two times a day (BID) | ORAL | 0 refills | Status: DC | PRN

## 2017-06-05 NOTE — Telephone Encounter (Signed)
Spoke with patient; explained that her PCP is in agreement that she does need to get outpatient MRI as quickly as possible;  Will also send in Valium as an alternative to the Antivert for the dizziness; she is in agreement; she has not started antibiotics given at last appointment but will hold the prescription for now.

## 2017-06-08 ENCOUNTER — Ambulatory Visit
Admission: RE | Admit: 2017-06-08 | Discharge: 2017-06-08 | Disposition: A | Discharge status: Home / Self Care | Payer: Medicare Other | Source: Ambulatory Visit | Attending: Family | Admitting: Family

## 2017-06-08 DIAGNOSIS — R29818 Other symptoms and signs involving the nervous system: Secondary | ICD-10-CM

## 2017-06-09 ENCOUNTER — Ambulatory Visit
Admission: RE | Admit: 2017-06-09 | Discharge: 2017-06-09 | Disposition: A | Discharge status: Home / Self Care | Payer: Medicare Other | Source: Ambulatory Visit | Attending: Family | Admitting: Family

## 2017-06-09 NOTE — Progress Notes (Signed)
Subjective:    Patient ID: Kerry Powell, female    DOB: [birth year], 72 y.o.   MRN: [id number]  HPI The patient is here for follow up.      Dizziness:  She saw Mickel Baas on 4/5 for dizziness for 4 days.  The dizziness was coming in waves.  She had pressure in her head and behind her eyes.  She felt hot/overheated.  Some loose stools and nausea.  Mickel Baas was concerned about a sinus infection and prescribed omnicef but she did not take it - she was concerned about it worsening her diarrhea .  She has a stated some changes in her personality and overall not feeling well.  MRI was done yesterday - stable since 2016 - no explantion for dizziness.  There was small fluid level in right sphenoid sinus.  The dizziness would come if she looked to the right or left - it is a wave of dizziness/lightheadedness.  She will have it when she is walking or if the TV becomes lighter suddenly.    She did try the Valium, but it made her very sleepy.  The meclizine did not work.  In going through some of her medications she did state that after her last visit she not only stop the diclofenac, which I asked her to stop due to her chronic kidney disease-she also stopped the Cymbalta cold [place].  She was taking 120 mg daily.  Discussed with her that that could be causing some of the above symptoms.  CKD:  We stopped the diclofenac three weeks ago.  She is drinking water with lemon.    Chronic neck and posterior head pain:  She is not taking diclofenac or cymbatla.  She stopped both suddenly.  Her pain has increased.  She has increased pain - her injections have worn off.  She follows up for additional injections tomorrow.  Hypertension:  She stopped losartan three weeks ago and started telmisartan.  We also increased amlodipine to 7.5 mg daily.  She thinks the swelling in her legs has gotten slightly worse and may need to take the Lasix more frequently.  She denies any chest pain or palpitations.  Medications and allergies  reviewed with patient and updated if appropriate.  Patient Active Problem List   Diagnosis Date Noted  . Dizziness 06/10/2017  . H/O renal cell carcinoma 05/23/2017  . Hyperlipidemia 06/25/2016  . Occipital neuralgia 05/09/2016  . Anxiety 07/26/2015  . Chronic neck pain 04/30/2015  . Diabetes ([place]) 04/27/2015  . History of CVA (cerebrovascular accident) 04/26/2015  . History of gout 04/26/2015  . CKD (chronic kidney disease) 04/26/2015  . Essential hypertension, benign 04/26/2015  . Snoring 04/26/2015  . Localized edema 04/26/2015  . Dysphagia 04/26/2015  . GERD (gastroesophageal reflux disease) 04/26/2015  . Personal history of colonic adenoma 05/29/2007  . HIATAL HERNIA 04/07/2007  . RESTLESS LEG SYNDROME 04/04/2007  . 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 COPD 04/04/2007  . IRRITABLE BOWEL SYNDROME 04/04/2007  . OSTEOARTHRITIS 04/04/2007    Current Outpatient Medications on File Prior to Visit  Medication Sig Dispense Refill  . ACETAMINOPHEN-BUTALBITAL 50-325 MG TABS TAKE 1 TABLET BY MOUTH EVERY DAY AS NEEDED 30 each 0  . allopurinol (ZYLOPRIM) 300 MG tablet TAKE 1 TABLET BY MOUTH EVERY DAY 90 tablet 1  . amLODipine (NORVASC) 5 MG tablet Take 1.5 tablets (7.5 mg total) by mouth daily. 135 tablet 3  . clopidogrel (PLAVIX) 75 MG tablet TAKE 1 TABLET BY MOUTH EVERY DAY 90  tablet 1  . cycloSPORINE (RESTASIS MULTIDOSE) 0.05 % ophthalmic emulsion INSTILL 1 DROP INTO BOTH EYES TWICE A DAY    . diazepam (VALIUM) 5 MG tablet Take 1 tablet (5 mg total) by mouth every 12 (twelve) hours as needed. For dizziness 30 tablet 0  . fluorometholone (FML FORTE) 0.25 % ophthalmic suspension INSTILL 1 DROP INTO BOTH EYES TWICE A DAY FOR FOUR WEEKS THEN D/C    . furosemide (LASIX) 20 MG tablet Take 1 tablet (20 mg total) by mouth every 3 (three) days. 30 tablet 5  . ketoconazole (NIZORAL) 2 % cream APPLY TO AFFECTED AREA UP TO TWICE A DAY AS NEEDED    . losartan (COZAAR) 100 MG tablet Take by  mouth.    . metFORMIN (GLUCOPHAGE) 1000 MG tablet Take 1 tablet (1,000 mg total) by mouth 2 (two) times daily with a meal. 180 tablet 1  . metoprolol succinate (TOPROL-XL) 25 MG 24 hr tablet TAKE 1 TABLET (25 MG TOTAL) BY MOUTH DAILY. 90 tablet 1  . neomycin-polymyxin b-dexamethasone (MAXITROL) 3.5-10000-0.1 OINT APPLY A SMALL AMOUNT INTO LEFT EYE AT BEDTIME    . pantoprazole (PROTONIX) 40 MG tablet TAKE 1 TABLET BY MOUTH EVERY DAY 90 tablet 1  . rosuvastatin (CRESTOR) 10 MG tablet TAKE 1 TABLET (10 MG TOTAL) BY MOUTH DAILY. 90 tablet 3  . telmisartan (MICARDIS) 80 MG tablet Take 1 tablet (80 mg total) by mouth daily. 90 tablet 3  . zaleplon (SONATA) 5 MG capsule 1-2 po 1 hour before bedtime     No current facility-administered medications on file prior to visit.     Past Medical History:  Diagnosis Date  . Anxiety   . Arthritis   . Cataract    removed both eyes  . COPD (chronic obstructive pulmonary disease) ([place])    pt denies - told this while was smoking -  . GASTRIC ULCER, ACUTE 04/07/2007   Annotation: EGD 04/07/07 Qualifier: Diagnosis of  By: Carlean Purl MD, Dimas Millin   . GERD (gastroesophageal reflux disease)   . Hyperlipidemia   . Hypertension   . Kidney tumor (benign)   . Personal history of colonic adenoma 06/07/2012  . Stroke [facility])    x2 2011  . TIA (transient ischemic attack)     Past Surgical History:  Procedure Laterality Date  . ABDOMINAL HYSTERECTOMY    . CATARACT EXTRACTION    . COLONOSCOPY    . CYSTOCELE REPAIR    . ESOPHAGOGASTRODUODENOSCOPY    . KIDNEY SURGERY     benign tumor removal  . ORTHOPEDIC SURGERY     Wrist & elbow  . POLYPECTOMY    . RECTOCELE REPAIR    . UPPER GASTROINTESTINAL ENDOSCOPY      Social History   Socioeconomic History  . Marital status: Married    Spouse name: Not on file  . Number of children: 2  . Years of education: Not on file  . Highest education level: Not on file  Occupational History  . Occupation: Retired  Photographer  . Financial resource strain: Not on file  . Food insecurity:    Worry: Not on file    Inability: Not on file  . Transportation needs:    Medical: Not on file    Non-medical: Not on file  Tobacco Use  . Smoking status: Former Smoker    Last attempt to quit: 06/30/2009    Years since quitting: 7.9  . Smokeless tobacco: Never Used  Substance and Sexual Activity  .  Alcohol use: No  . Drug use: No  . Sexual activity: Not on file  Lifestyle  . Physical activity:    Days per week: Not on file    Minutes per session: Not on file  . Stress: Not on file  Relationships  . Social connections:    Talks on phone: Not on file    Gets together: Not on file    Attends religious service: Not on file    Active member of club or organization: Not on file    Attends meetings of clubs or organizations: Not on file    Relationship status: Not on file  Other Topics Concern  . Not on file  Social History Narrative   Married, retired   1 son 1 daughter   2 caffeine/day    Family History  Problem Relation Age of Onset  . Congestive Heart Failure Mother   . Cancer Mother        ovarian  . Heart disease Father   . Diabetes Father   . Kidney disease Father   . Diabetes Sister   . Kidney disease Sister   . Colon cancer Neg Hx   . Colon polyps Neg Hx   . Esophageal cancer Neg Hx   . Rectal cancer Neg Hx   . Stomach cancer Neg Hx     Review of Systems  Constitutional: Negative for chills and fever.  HENT: Negative for sinus pressure (pressure behind eyes).   Respiratory: Positive for cough (allergy related), shortness of breath (allergy related) and wheezing (allergy related).   Cardiovascular: Positive for leg swelling (slightly worse). Negative for chest pain and palpitations.  Musculoskeletal: Positive for neck pain.  Neurological: Positive for headaches.       Objective:   Vitals:   06/10/17 1354  BP: 118/76  Pulse: 94  Resp: 16  Temp: (!) 97.5 F (36.4 C)  SpO2: 95%    BP Readings from Last 3 Encounters:  06/10/17 118/76  05/31/17 138/90  05/20/17 (!) 150/94   Wt Readings from Last 3 Encounters:  06/10/17 234 lb (106.1 kg)  05/31/17 228 lb 1.3 oz (103.5 kg)  05/20/17 229 lb (103.9 kg)   Body mass index is 38.94 kg/m.   Physical Exam    Constitutional: Appears well-developed and well-nourished. No distress.  HENT:  Head: Normocephalic and atraumatic.  Neck: Neck supple. No tracheal deviation present. No thyromegaly present.  No cervical lymphadenopathy Cardiovascular: Normal rate, regular rhythm and normal heart sounds.   No murmur heard. No carotid bruit .  Trace bilateral lower extremity edema Pulmonary/Chest: Effort normal and breath sounds normal. No respiratory distress. No has no wheezes. No rales.  Skin: Skin is warm and dry. Not diaphoretic.  Psychiatric: Normal mood and affect. Behavior is normal.      Assessment & Plan:    See Problem List for Assessment and Plan of chronic medical problems.

## 2017-06-10 ENCOUNTER — Other Ambulatory Visit (INDEPENDENT_AMBULATORY_CARE_PROVIDER_SITE_OTHER): Payer: Medicare Other

## 2017-06-10 ENCOUNTER — Telehealth: Payer: Self-pay | Admitting: Internal Medicine

## 2017-06-10 ENCOUNTER — Encounter: Payer: Self-pay | Admitting: Internal Medicine

## 2017-06-10 ENCOUNTER — Other Ambulatory Visit: Payer: Self-pay | Admitting: Family

## 2017-06-10 ENCOUNTER — Ambulatory Visit: Payer: Medicare Other | Admitting: Internal Medicine

## 2017-06-10 VITALS — BP 118/76 | HR 94 | Temp 97.5°F | Resp 16 | Wt 234.0 lb

## 2017-06-10 DIAGNOSIS — I10 Essential (primary) hypertension: Secondary | ICD-10-CM

## 2017-06-10 DIAGNOSIS — E7800 Pure hypercholesterolemia, unspecified: Secondary | ICD-10-CM

## 2017-06-10 DIAGNOSIS — E119 Type 2 diabetes mellitus without complications: Secondary | ICD-10-CM

## 2017-06-10 DIAGNOSIS — M5481 Occipital neuralgia: Secondary | ICD-10-CM | POA: Diagnosis not present

## 2017-06-10 DIAGNOSIS — R600 Localized edema: Secondary | ICD-10-CM

## 2017-06-10 DIAGNOSIS — R42 Dizziness and giddiness: Secondary | ICD-10-CM | POA: Diagnosis not present

## 2017-06-10 DIAGNOSIS — N289 Disorder of kidney and ureter, unspecified: Secondary | ICD-10-CM | POA: Diagnosis not present

## 2017-06-10 DIAGNOSIS — N189 Chronic kidney disease, unspecified: Secondary | ICD-10-CM

## 2017-06-10 DIAGNOSIS — E7849 Other hyperlipidemia: Secondary | ICD-10-CM

## 2017-06-10 HISTORY — DX: Dizziness and giddiness: R42

## 2017-06-10 LAB — COMPREHENSIVE METABOLIC PANEL
ALT: 15 U/L (ref 0–35)
AST: 13 U/L (ref 0–37)
Albumin: 4.1 g/dL (ref 3.5–5.2)
Alkaline Phosphatase: 103 U/L (ref 39–117)
BUN: 23 mg/dL (ref 6–23)
CO2: 26 mEq/L (ref 19–32)
Calcium: 8.9 mg/dL (ref 8.4–10.5)
Chloride: 102 mEq/L (ref 96–112)
Creatinine, Ser: 1.33 mg/dL — ABNORMAL HIGH (ref 0.40–1.20)
GFR: 41.65 mL/min — ABNORMAL LOW (ref >60.00)
Glucose, Bld: 135 mg/dL — ABNORMAL HIGH (ref 70–99)
Potassium: 4.1 mEq/L (ref 3.5–5.1)
Sodium: 138 mEq/L (ref 135–145)
Total Bilirubin: 0.4 mg/dL (ref 0.2–1.2)
Total Protein: 7.2 g/dL (ref 6.0–8.3)

## 2017-06-10 LAB — LIPID PANEL
Cholesterol: 156 mg/dL (ref 0–200)
HDL: 41.8 mg/dL (ref >39.00)
LDL Cholesterol: 79 mg/dL (ref 0–99)
NonHDL: 114.13
Total CHOL/HDL Ratio: 4
Triglycerides: 174 mg/dL — ABNORMAL HIGH (ref 0.0–149.0)
VLDL: 34.8 mg/dL (ref 0.0–40.0)

## 2017-06-10 NOTE — Assessment & Plan Note (Signed)
Slightly increased-possibly related to increased dose of amlodipine Continue Lasix as needed

## 2017-06-10 NOTE — Telephone Encounter (Signed)
Patient would like to know if she needs to do labs before her 3 month fu in July.  Patient states she prefers to have labs done the week prior to her appt if needed.   States a message can be sent to her through [place].

## 2017-06-10 NOTE — Assessment & Plan Note (Signed)
BP well controlled Current regimen effective and well tolerated Continue current medications at current doses cmp done today

## 2017-06-10 NOTE — Telephone Encounter (Signed)
Yes, blood work ordered 

## 2017-06-10 NOTE — Patient Instructions (Addendum)
Start taking claritin or zyrtec daily.  Use the valium as needed for the dizziness - you can take 1/2 of a pill or a full pill.   If your dizziness has not improved we can consider PT.      For your neck pain you can take tylenol or the acetaminophen-butulbital.  If we have to we can try tramadol.     Follow up in 3 months

## 2017-06-10 NOTE — Assessment & Plan Note (Signed)
Worsening pain since injections have worn off Stopped Cymbalta-she is unsure if it was helping No longer able to take diclofenac due to chronic kidney disease Tylenol as needed Acetaminophen-butalbital as needed She may be able to have repeat injections tomorrow-has follow-up

## 2017-06-10 NOTE — Assessment & Plan Note (Addendum)
MRI over the weekend showed no cause - stable since 2016 Likely benign paroxysmal positional vertigo-may also be related to stopping Cymbalta cold turkey-was taking 120 mg daily Meclizine not effective Taking valium prn-advised she can try taking half of the pill if this makes her too drowsy Consider PT if no improvement

## 2017-06-10 NOTE — Assessment & Plan Note (Signed)
Stop diclofenac Kidney function improved slightly We will continue to monitor Continue increased fluids

## 2017-06-11 NOTE — Telephone Encounter (Signed)
Spoke with pt to inform.  

## 2017-06-18 ENCOUNTER — Encounter: Payer: Self-pay | Admitting: Internal Medicine

## 2017-06-18 NOTE — Telephone Encounter (Signed)
Do we know when Dr Quay Burow will return?  If soon, I think she may be best to answer the question  I am not sure what date the procedure she mentions is scheduled, so this may be a factor if Dr Quay Burow is not back relatively soon

## 2017-06-18 NOTE — Telephone Encounter (Signed)
Per schedule she will return back on Monday 06/24/17. Will forward to MD../lmb

## 2017-06-19 ENCOUNTER — Encounter: Payer: Self-pay | Admitting: Emergency Medicine

## 2017-06-24 ENCOUNTER — Other Ambulatory Visit: Payer: Self-pay | Admitting: Internal Medicine

## 2017-06-27 ENCOUNTER — Encounter: Payer: Self-pay | Admitting: Internal Medicine

## 2017-06-30 ENCOUNTER — Other Ambulatory Visit: Payer: Self-pay | Admitting: Internal Medicine

## 2017-06-30 NOTE — Progress Notes (Signed)
Subjective:    Patient ID: Kerry Powell, female    DOB: 11-17-1945, 72 y.o.   MRN: [id number]  HPI The patient is here for follow up.  Chronic neck and posterior head pain:  She stopped diclofenac upon my advise due to CKD.  She had injections 06/26/17 and it did not help.  Now they want to do a nerve ablation.  The first set of injections did help and her pain is better-currently 3/10.  She does not think the second injections helped much.  The injections were very painful to have been done she is not sure if she wants to do anything further.  Increased irritability:  She did also stop the cymbalta abruptly when she stoped the diclofenac.  She was under the impression that she was not able to take that medication.  Over the past few weeks she has felt more irritable and depressed.  She has no motivation to do anything.  She does not want to cook.  She has less energy and feels tired.  She is not handling things as well.  There has been some family stresses as well.  When she gets up from the chair or computer her legs hurt and feel weak.  She is not sleeping - she is up and down going to the bathroom.  She is fatigued.    Medications and allergies reviewed with patient and updated if appropriate.  Patient Active Problem List   Diagnosis Date Noted  . Dizziness 06/10/2017  . H/O renal cell carcinoma 05/23/2017  . Hyperlipidemia 06/25/2016  . Occipital neuralgia 05/09/2016  . Anxiety 07/26/2015  . Chronic neck pain 04/30/2015  . Diabetes ([place]) 04/27/2015  . History of CVA (cerebrovascular accident) 04/26/2015  . History of gout 04/26/2015  . CKD (chronic kidney disease) 04/26/2015  . Essential hypertension, benign 04/26/2015  . Snoring 04/26/2015  . Localized edema 04/26/2015  . Dysphagia 04/26/2015  . GERD (gastroesophageal reflux disease) 04/26/2015  . Personal history of colonic adenoma 05/29/2007  . HIATAL HERNIA 04/07/2007  . RESTLESS LEG SYNDROME 04/04/2007  .  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 COPD 04/04/2007  . IRRITABLE BOWEL SYNDROME 04/04/2007  . OSTEOARTHRITIS 04/04/2007    Current Outpatient Medications on File Prior to Visit  Medication Sig Dispense Refill  . ACETAMINOPHEN-BUTALBITAL 50-325 MG TABS TAKE 1 TABLET BY MOUTH EVERY DAY AS NEEDED 30 each 0  . allopurinol (ZYLOPRIM) 300 MG tablet TAKE 1 TABLET BY MOUTH EVERY DAY 90 tablet 1  . amLODipine (NORVASC) 5 MG tablet Take 1.5 tablets (7.5 mg total) by mouth daily. 135 tablet 3  . clopidogrel (PLAVIX) 75 MG tablet TAKE 1 TABLET BY MOUTH EVERY DAY 90 tablet 1  . diazepam (VALIUM) 5 MG tablet Take 1 tablet (5 mg total) by mouth every 12 (twelve) hours as needed. For dizziness 30 tablet 0  . fluorometholone (FML FORTE) 0.25 % ophthalmic suspension INSTILL 1 DROP INTO BOTH EYES TWICE A DAY FOR FOUR WEEKS THEN D/C    . furosemide (LASIX) 20 MG tablet Take 1 tablet (20 mg total) by mouth every 3 (three) days. 30 tablet 5  . ketoconazole (NIZORAL) 2 % cream APPLY TO AFFECTED AREA UP TO TWICE A DAY AS NEEDED    . metFORMIN (GLUCOPHAGE) 1000 MG tablet Take 1 tablet (1,000 mg total) by mouth 2 (two) times daily with a meal. 180 tablet 1  . metoprolol succinate (TOPROL-XL) 25 MG 24 hr tablet TAKE 1 TABLET (25 MG TOTAL) BY MOUTH DAILY.  90 tablet 1  . pantoprazole (PROTONIX) 40 MG tablet TAKE 1 TABLET BY MOUTH EVERY DAY 90 tablet 1  . rosuvastatin (CRESTOR) 10 MG tablet TAKE 1 TABLET (10 MG TOTAL) BY MOUTH DAILY. 90 tablet 3  . telmisartan (MICARDIS) 80 MG tablet Take 1 tablet (80 mg total) by mouth daily. 90 tablet 3   No current facility-administered medications on file prior to visit.     Past Medical History:  Diagnosis Date  . Anxiety   . Arthritis   . Cataract    removed both eyes  . COPD (chronic obstructive pulmonary disease) ([place])    pt denies - told this while was smoking -  . GASTRIC ULCER, ACUTE 04/07/2007   Annotation: EGD 04/07/07 Qualifier: Diagnosis of  By: Carlean Purl MD, Dimas Millin   . GERD (gastroesophageal reflux disease)   . Hyperlipidemia   . Hypertension   . Kidney tumor (benign)   . Personal history of colonic adenoma 06/07/2012  . Stroke [facility])    x2 2011  . TIA (transient ischemic attack)     Past Surgical History:  Procedure Laterality Date  . ABDOMINAL HYSTERECTOMY    . CATARACT EXTRACTION    . COLONOSCOPY    . CYSTOCELE REPAIR    . ESOPHAGOGASTRODUODENOSCOPY    . KIDNEY SURGERY     benign tumor removal  . ORTHOPEDIC SURGERY     Wrist & elbow  . POLYPECTOMY    . RECTOCELE REPAIR    . UPPER GASTROINTESTINAL ENDOSCOPY      Social History   Socioeconomic History  . Marital status: Married    Spouse name: Not on file  . Number of children: 2  . Years of education: Not on file  . Highest education level: Not on file  Occupational History  . Occupation: Retired  Scientific laboratory technician  . Financial resource strain: Not on file  . Food insecurity:    Worry: Not on file    Inability: Not on file  . Transportation needs:    Medical: Not on file    Non-medical: Not on file  Tobacco Use  . Smoking status: Former Smoker    Last attempt to quit: 06/30/2009    Years since quitting: 8.0  . Smokeless tobacco: Never Used  Substance and Sexual Activity  . Alcohol use: No  . Drug use: No  . Sexual activity: Not on file  Lifestyle  . Physical activity:    Days per week: Not on file    Minutes per session: Not on file  . Stress: Not on file  Relationships  . Social connections:    Talks on phone: Not on file    Gets together: Not on file    Attends religious service: Not on file    Active member of club or organization: Not on file    Attends meetings of clubs or organizations: Not on file    Relationship status: Not on file  Other Topics Concern  . Not on file  Social History Narrative   Married, retired   1 son 1 daughter   2 caffeine/day    Family History  Problem Relation Age of Onset  . Congestive Heart Failure Mother   . Cancer  Mother        ovarian  . Heart disease Father   . Diabetes Father   . Kidney disease Father   . Diabetes Sister   . Kidney disease Sister   . Colon cancer Neg Hx   .  Colon polyps Neg Hx   . Esophageal cancer Neg Hx   . Rectal cancer Neg Hx   . Stomach cancer Neg Hx     Review of Systems  Constitutional: Positive for fatigue. Negative for appetite change, chills and fever.  Musculoskeletal: Positive for back pain and neck pain.  Neurological: Positive for weakness (Generalized-not localized). Negative for headaches.  Psychiatric/Behavioral: Positive for agitation, dysphoric mood and sleep disturbance. The patient is nervous/anxious.        Objective:   Vitals:   07/01/17 1524  BP: 126/80  Pulse: 86  Resp: 16  Temp: 97.6 F (36.4 C)  SpO2: 95%   BP Readings from Last 3 Encounters:  07/01/17 126/80  06/10/17 118/76  05/31/17 138/90   Wt Readings from Last 3 Encounters:  07/01/17 234 lb (106.1 kg)  06/10/17 234 lb (106.1 kg)  05/31/17 228 lb 1.3 oz (103.5 kg)   Body mass index is 38.94 kg/m.   Physical Exam  Constitutional: She appears well-developed and well-nourished. No distress.  Skin: Skin is warm and dry. She is not diaphoretic.  Psychiatric: Her behavior is normal. Judgment and thought content normal.  Depressed mood and affect, crying intermittently, slightly anxious at times            Assessment & Plan:    See Problem List for Assessment and Plan of chronic medical problems.

## 2017-07-01 ENCOUNTER — Ambulatory Visit: Payer: Medicare Other | Admitting: Internal Medicine

## 2017-07-01 ENCOUNTER — Ambulatory Visit: Payer: Medicare Other | Admitting: Family Medicine

## 2017-07-01 ENCOUNTER — Encounter: Payer: Self-pay | Admitting: Internal Medicine

## 2017-07-01 VITALS — BP 126/80 | HR 86 | Temp 97.6°F | Resp 16 | Wt 234.0 lb

## 2017-07-01 DIAGNOSIS — F329 Major depressive disorder, single episode, unspecified: Secondary | ICD-10-CM | POA: Insufficient documentation

## 2017-07-01 DIAGNOSIS — M542 Cervicalgia: Secondary | ICD-10-CM | POA: Diagnosis not present

## 2017-07-01 DIAGNOSIS — F3289 Other specified depressive episodes: Secondary | ICD-10-CM | POA: Diagnosis not present

## 2017-07-01 DIAGNOSIS — F32A Depression, unspecified: Secondary | ICD-10-CM | POA: Insufficient documentation

## 2017-07-01 DIAGNOSIS — F419 Anxiety disorder, unspecified: Secondary | ICD-10-CM

## 2017-07-01 DIAGNOSIS — G8929 Other chronic pain: Secondary | ICD-10-CM

## 2017-07-01 HISTORY — DX: Depression, unspecified: F32.A

## 2017-07-01 MED ORDER — DULOXETINE HCL 30 MG PO CPEP: ORAL_CAPSULE | ORAL | 3 refills | Status: DC

## 2017-07-01 NOTE — Assessment & Plan Note (Signed)
She is primarily experiencing depression, but there is also some anxiety Restart Cymbalta-30 mg x 2 weeks and then 60 mg daily Continue diazepam as needed

## 2017-07-01 NOTE — Patient Instructions (Signed)
Restart the Cymbalta - take 30 mg daily for two weeks and then increase it to 60 mg daily.   Follow up with me in 6-8 weeks

## 2017-07-01 NOTE — Assessment & Plan Note (Signed)
Improvement with injections and pain level currently 3/10 Headaches improved Is unsure if she wants to do any further procedures as far as further injections or nerve ablation No longer taking diclofenac due to chronic kidney disease

## 2017-07-01 NOTE — Assessment & Plan Note (Signed)
Since stopping the Cymbalta she has been depressed, more irritable, experiencing decreased motivation and has not been sleeping as well She has had some depression in the past and she understands this is a chemical imbalance Discussed options Restart Cymbalta-start 30 mg daily for 2 weeks and then increase to 60 mg daily Follow-up in July, sooner if needed

## 2017-07-02 LAB — HM DIABETES EYE EXAM

## 2017-07-06 ENCOUNTER — Encounter: Payer: Self-pay | Admitting: Internal Medicine

## 2017-07-14 ENCOUNTER — Other Ambulatory Visit: Payer: Self-pay | Admitting: Internal Medicine

## 2017-07-24 ENCOUNTER — Other Ambulatory Visit: Payer: Self-pay | Admitting: Internal Medicine

## 2017-09-04 ENCOUNTER — Other Ambulatory Visit: Payer: Self-pay | Admitting: Internal Medicine

## 2017-09-08 NOTE — Progress Notes (Signed)
Subjective:    Patient ID: Kerry Powell, female    DOB: [birth year], 72 y.o.   MRN: [id number]  HPI The patient is here for follow up.  Hypertension: She is taking her medication daily. She is compliant with a low sodium diet.  She denies chest pain, palpitations, edema, shortness of breath and regular headaches. She is not exercising regularly.      Hyperlipidemia: She is taking her medication daily. She is compliant with a low fat/cholesterol diet. She is not exercising regularly. She denies myalgias.   Diabetes: She is taking her medication daily as prescribed. She is compliant with a diabetic diet. She is not exercising regularly.  She checks her feet daily and denies foot lesions. She is up-to-date with an ophthalmology examination.   Depression, irritability:  We restarted her cymbalta three months ago.  She feels her depression and anxiety are controlled.   Chronic neck pain:  Injections helped some.  Was considering a nerve ablation.  Not taking diclofenac.  The pain is always there, but it is not severe like it was before.    Nausea, vomiting:  She has occasional waves of nausea.  She has been vomiting after dinner. It can occur 3-4 hours after eating.  She will get a lower abdominal cramping prior to vomiting.  For a while she had diarrhea nad vomiting, then she had constipation and vomiting, but now her stools are normal.  She denies GERD, reflux.  She feels most of this is related to not sleeping.  She feels once her sleep gets better everything else will get better.  She is not sleeping well.  She has been on Ambien and Sonata in the past and they were not effective.  Part of her problem is she has difficulty falling to sleep because her husband snores.  She often does not go to sleep until 5 5:30 in the morning.  She often has to sleep during the day because she is so exhausted.  In the past she has gotten herself back on a good schedule by staying up all day and all night and  then going to sleep the next night at a reasonable time.  Medications and allergies reviewed with patient and updated if appropriate.  Patient Active Problem List   Diagnosis Date Noted  . Depression 07/01/2017  . Dizziness 06/10/2017  . H/O renal cell carcinoma 05/23/2017  . Hyperlipidemia 06/25/2016  . Occipital neuralgia 05/09/2016  . Anxiety 07/26/2015  . Chronic neck pain 04/30/2015  . Diabetes ([place]) 04/27/2015  . History of CVA (cerebrovascular accident) 04/26/2015  . History of gout 04/26/2015  . CKD (chronic kidney disease) 04/26/2015  . Essential hypertension, benign 04/26/2015  . Snoring 04/26/2015  . Localized edema 04/26/2015  . Dysphagia 04/26/2015  . GERD (gastroesophageal reflux disease) 04/26/2015  . Personal history of colonic adenoma 05/29/2007  . HIATAL HERNIA 04/07/2007  . RESTLESS LEG SYNDROME 04/04/2007  . 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 COPD 04/04/2007  . IRRITABLE BOWEL SYNDROME 04/04/2007  . OSTEOARTHRITIS 04/04/2007    Current Outpatient Medications on File Prior to Visit  Medication Sig Dispense Refill  . ACETAMINOPHEN-BUTALBITAL 50-325 MG TABS TAKE 1 TABLET BY MOUTH EVERY DAY AS NEEDED 30 each 0  . allopurinol (ZYLOPRIM) 300 MG tablet TAKE 1 TABLET BY MOUTH EVERY DAY 90 tablet 1  . amLODipine (NORVASC) 5 MG tablet Take 1.5 tablets (7.5 mg total) by mouth daily. 135 tablet 3  . clopidogrel (PLAVIX) 75 MG tablet TAKE 1  TABLET BY MOUTH EVERY DAY 90 tablet 1  . DULoxetine (CYMBALTA) 30 MG capsule TAKE 30 MG DAILY FOR TWO WEEKS THEN INCREASE TO 60 MG DAILY 180 capsule 0  . furosemide (LASIX) 20 MG tablet Take 1 tablet (20 mg total) by mouth every 3 (three) days. 30 tablet 5  . ketoconazole (NIZORAL) 2 % cream APPLY TO AFFECTED AREA UP TO TWICE A DAY AS NEEDED    . metFORMIN (GLUCOPHAGE) 1000 MG tablet TAKE 1 TABLET (1,000 MG TOTAL) BY MOUTH 2 (TWO) TIMES DAILY WITH A MEAL. 180 tablet 1  . metoprolol succinate (TOPROL-XL) 25 MG 24 hr tablet TAKE 1  TABLET BY MOUTH EVERY DAY 90 tablet 1  . pantoprazole (PROTONIX) 40 MG tablet TAKE 1 TABLET BY MOUTH EVERY DAY 90 tablet 1  . rosuvastatin (CRESTOR) 10 MG tablet TAKE 1 TABLET (10 MG TOTAL) BY MOUTH DAILY. 90 tablet 3  . telmisartan (MICARDIS) 80 MG tablet Take 1 tablet (80 mg total) by mouth daily. 90 tablet 3   No current facility-administered medications on file prior to visit.     Past Medical History:  Diagnosis Date  . Anxiety   . Arthritis   . Cataract    removed both eyes  . COPD (chronic obstructive pulmonary disease) ([place])    pt denies - told this while was smoking -  . GASTRIC ULCER, ACUTE 04/07/2007   Annotation: EGD 04/07/07 Qualifier: Diagnosis of  By: Carlean Purl MD, Dimas Millin   . GERD (gastroesophageal reflux disease)   . Hyperlipidemia   . Hypertension   . Kidney tumor (benign)   . Personal history of colonic adenoma 06/07/2012  . Stroke [facility])    x2 2011  . TIA (transient ischemic attack)     Past Surgical History:  Procedure Laterality Date  . ABDOMINAL HYSTERECTOMY    . CATARACT EXTRACTION    . COLONOSCOPY    . CYSTOCELE REPAIR    . ESOPHAGOGASTRODUODENOSCOPY    . KIDNEY SURGERY     benign tumor removal  . ORTHOPEDIC SURGERY     Wrist & elbow  . POLYPECTOMY    . RECTOCELE REPAIR    . UPPER GASTROINTESTINAL ENDOSCOPY      Social History   Socioeconomic History  . Marital status: Married    Spouse name: Not on file  . Number of children: 2  . Years of education: Not on file  . Highest education level: Not on file  Occupational History  . Occupation: Retired  Scientific laboratory technician  . Financial resource strain: Not on file  . Food insecurity:    Worry: Not on file    Inability: Not on file  . Transportation needs:    Medical: Not on file    Non-medical: Not on file  Tobacco Use  . Smoking status: Former Smoker    Last attempt to quit: 06/30/2009    Years since quitting: 8.2  . Smokeless tobacco: Never Used  Substance and Sexual Activity  . Alcohol  use: No  . Drug use: No  . Sexual activity: Not on file  Lifestyle  . Physical activity:    Days per week: Not on file    Minutes per session: Not on file  . Stress: Not on file  Relationships  . Social connections:    Talks on phone: Not on file    Gets together: Not on file    Attends religious service: Not on file    Active member of club or organization: Not  on file    Attends meetings of clubs or organizations: Not on file    Relationship status: Not on file  Other Topics Concern  . Not on file  Social History Narrative   Married, retired   1 son 1 daughter   2 caffeine/day    Family History  Problem Relation Age of Onset  . Congestive Heart Failure Mother   . Cancer Mother        ovarian  . Heart disease Father   . Diabetes Father   . Kidney disease Father   . Diabetes Sister   . Kidney disease Sister   . Colon cancer Neg Hx   . Colon polyps Neg Hx   . Esophageal cancer Neg Hx   . Rectal cancer Neg Hx   . Stomach cancer Neg Hx     Review of Systems  Constitutional: Positive for fatigue. Negative for chills and fever.  HENT: Positive for postnasal drip.   Respiratory: Positive for cough (PND) and shortness of breath. Negative for wheezing.   Cardiovascular: Positive for leg swelling (controlled with lasix). Negative for chest pain and palpitations.  Gastrointestinal: Positive for abdominal pain (prior to vomiting), nausea and vomiting.  Musculoskeletal: Positive for neck pain.  Neurological: Positive for headaches. Negative for light-headedness.  Psychiatric/Behavioral: Positive for decreased concentration (controlled) and sleep disturbance. The patient is nervous/anxious (controlled).        Objective:   Vitals:   09/09/17 1335  BP: 126/74  Pulse: 81  Resp: 16  Temp: 97.6 F (36.4 C)  SpO2: 96%   BP Readings from Last 3 Encounters:  09/09/17 126/74  07/01/17 126/80  06/10/17 118/76   Wt Readings from Last 3 Encounters:  09/09/17 234 lb  (106.1 kg)  07/01/17 234 lb (106.1 kg)  06/10/17 234 lb (106.1 kg)   Body mass index is 38.94 kg/m.   Physical Exam    Constitutional: Appears well-developed and well-nourished. No distress.  HENT:  Head: Normocephalic and atraumatic.  Neck: Neck supple. No tracheal deviation present. No thyromegaly present.  No cervical lymphadenopathy Cardiovascular: Normal rate, regular rhythm and normal heart sounds.   No murmur heard. No carotid bruit .  No edema Pulmonary/Chest: Effort normal and breath sounds normal. No respiratory distress. No has no wheezes. No rales.  Skin: Skin is warm and dry. Not diaphoretic.  Psychiatric: Normal mood and affect. Behavior is normal.      Assessment & Plan:    See Problem List for Assessment and Plan of chronic medical problems.

## 2017-09-09 ENCOUNTER — Other Ambulatory Visit (INDEPENDENT_AMBULATORY_CARE_PROVIDER_SITE_OTHER): Payer: Medicare Other

## 2017-09-09 ENCOUNTER — Encounter: Payer: Self-pay | Admitting: Internal Medicine

## 2017-09-09 ENCOUNTER — Ambulatory Visit: Payer: Medicare Other | Admitting: Internal Medicine

## 2017-09-09 VITALS — BP 126/74 | HR 81 | Temp 97.6°F | Resp 16 | Wt 234.0 lb

## 2017-09-09 DIAGNOSIS — F419 Anxiety disorder, unspecified: Secondary | ICD-10-CM | POA: Diagnosis not present

## 2017-09-09 DIAGNOSIS — M542 Cervicalgia: Secondary | ICD-10-CM

## 2017-09-09 DIAGNOSIS — R112 Nausea with vomiting, unspecified: Secondary | ICD-10-CM

## 2017-09-09 DIAGNOSIS — E119 Type 2 diabetes mellitus without complications: Secondary | ICD-10-CM

## 2017-09-09 DIAGNOSIS — N189 Chronic kidney disease, unspecified: Secondary | ICD-10-CM | POA: Diagnosis not present

## 2017-09-09 DIAGNOSIS — G479 Sleep disorder, unspecified: Secondary | ICD-10-CM

## 2017-09-09 DIAGNOSIS — F3289 Other specified depressive episodes: Secondary | ICD-10-CM

## 2017-09-09 DIAGNOSIS — G8929 Other chronic pain: Secondary | ICD-10-CM | POA: Diagnosis not present

## 2017-09-09 DIAGNOSIS — I10 Essential (primary) hypertension: Secondary | ICD-10-CM | POA: Diagnosis not present

## 2017-09-09 HISTORY — DX: Nausea with vomiting, unspecified: R11.2

## 2017-09-09 HISTORY — DX: Sleep disorder, unspecified: G47.9

## 2017-09-09 LAB — VITAMIN D 25 HYDROXY (VIT D DEFICIENCY, FRACTURES): VITD: 8.08 ng/mL — ABNORMAL LOW (ref 30.00–100.00)

## 2017-09-09 LAB — COMPREHENSIVE METABOLIC PANEL
ALT: 16 U/L (ref 0–35)
AST: 14 U/L (ref 0–37)
Albumin: 4.3 g/dL (ref 3.5–5.2)
Alkaline Phosphatase: 107 U/L (ref 39–117)
BUN: 24 mg/dL — ABNORMAL HIGH (ref 6–23)
CO2: 26 mEq/L (ref 19–32)
Calcium: 9.1 mg/dL (ref 8.4–10.5)
Chloride: 103 mEq/L (ref 96–112)
Creatinine, Ser: 1.59 mg/dL — ABNORMAL HIGH (ref 0.40–1.20)
GFR: 33.87 mL/min — ABNORMAL LOW (ref >60.00)
Glucose, Bld: 149 mg/dL — ABNORMAL HIGH (ref 70–99)
Potassium: 4.3 mEq/L (ref 3.5–5.1)
Sodium: 137 mEq/L (ref 135–145)
Total Bilirubin: 0.4 mg/dL (ref 0.2–1.2)
Total Protein: 7.2 g/dL (ref 6.0–8.3)

## 2017-09-09 LAB — CBC WITH DIFFERENTIAL/PLATELET
Basophils Absolute: 0.1 10*3/uL (ref 0.0–0.1)
Basophils Relative: 0.5 % (ref 0.0–3.0)
Eosinophils Absolute: 0.4 10*3/uL (ref 0.0–0.7)
Eosinophils Relative: 3.8 % (ref 0.0–5.0)
HCT: 36.7 % (ref 36.0–46.0)
Hemoglobin: 12.1 g/dL (ref 12.0–15.0)
Lymphocytes Relative: 22.2 % (ref 12.0–46.0)
Lymphs Abs: 2.2 10*3/uL (ref 0.7–4.0)
MCHC: 33 g/dL (ref 30.0–36.0)
MCV: 91.8 fl (ref 78.0–100.0)
Monocytes Absolute: 0.7 10*3/uL (ref 0.1–1.0)
Monocytes Relative: 6.8 % (ref 3.0–12.0)
Neutro Abs: 6.6 10*3/uL (ref 1.4–7.7)
Neutrophils Relative %: 66.7 % (ref 43.0–77.0)
Platelets: 331 10*3/uL (ref 150.0–400.0)
RBC: 4 Mil/uL (ref 3.87–5.11)
RDW: 14.9 % (ref 11.5–15.5)
WBC: 10 10*3/uL (ref 4.0–10.5)

## 2017-09-09 LAB — LIPID PANEL
Cholesterol: 163 mg/dL (ref 0–200)
HDL: 38.9 mg/dL — ABNORMAL LOW (ref >39.00)
NonHDL: 124.35
Total CHOL/HDL Ratio: 4
Triglycerides: 223 mg/dL — ABNORMAL HIGH (ref 0.0–149.0)
VLDL: 44.6 mg/dL — ABNORMAL HIGH (ref 0.0–40.0)

## 2017-09-09 LAB — LDL CHOLESTEROL, DIRECT: Direct LDL: 97 mg/dL

## 2017-09-09 LAB — HEMOGLOBIN A1C: Hgb A1c MFr Bld: 7.2 % — ABNORMAL HIGH (ref 4.6–6.5)

## 2017-09-09 NOTE — Assessment & Plan Note (Signed)
She denies any GERD symptoms and feels her GERD is well controlled She feels the nausea and vomiting is related to not sleeping, which could be playing a role She deferred GI referral at this time, but if her symptoms do not improve with improving her sleep she will let me know so that I can refer her

## 2017-09-09 NOTE — Assessment & Plan Note (Signed)
Check CMP, vitamin D level

## 2017-09-09 NOTE — Assessment & Plan Note (Addendum)
Sonata, ambien not effective Never tried lunesta or belsomra-We could consider trying 1 of these if needed in the near future Discussed sleep hygiene-she needs to not nap during the day and start to go to bed at an earlier time and on a consistent basis

## 2017-09-09 NOTE — Assessment & Plan Note (Signed)
Controlled, stable Continue current dose of medication  

## 2017-09-09 NOTE — Patient Instructions (Addendum)
  Test(s) ordered today. Your results will be released to MyChart (or called to you) after review, usually within 72hours after test completion. If any changes need to be made, you will be notified at that same time.  Medications reviewed and updated.  No changes recommended at this time.    Please followup in 6 months   

## 2017-09-09 NOTE — Assessment & Plan Note (Signed)
Taking metformin Check A1c Will adjust medication if needed

## 2017-09-09 NOTE — Assessment & Plan Note (Signed)
Blood pressure controlled Continue current medication and current dose CMP

## 2017-09-09 NOTE — Assessment & Plan Note (Signed)
Chronic neck pain-still considering nerve ablation Pain is better than it has been in the past

## 2017-09-10 ENCOUNTER — Other Ambulatory Visit: Payer: Self-pay | Admitting: Internal Medicine

## 2017-09-10 MED ORDER — VITAMIN D (ERGOCALCIFEROL) 1.25 MG (50000 UNIT) PO CAPS: 50000.0000 [IU] | ORAL_CAPSULE | ORAL | 0 refills | Status: DC

## 2017-10-27 IMAGING — DX DG CHEST 2V
2 series · 2 of 2 positions shown · non-contrast
Comparison: Chest radiographs 03/17/2015 and earlier.

CLINICAL DATA: 70-year-old female with right side renal cell
carcinoma status post cryoablation in 3000. Subsequent encounter.

EXAM:
CHEST  2 VIEW

[chest pa]
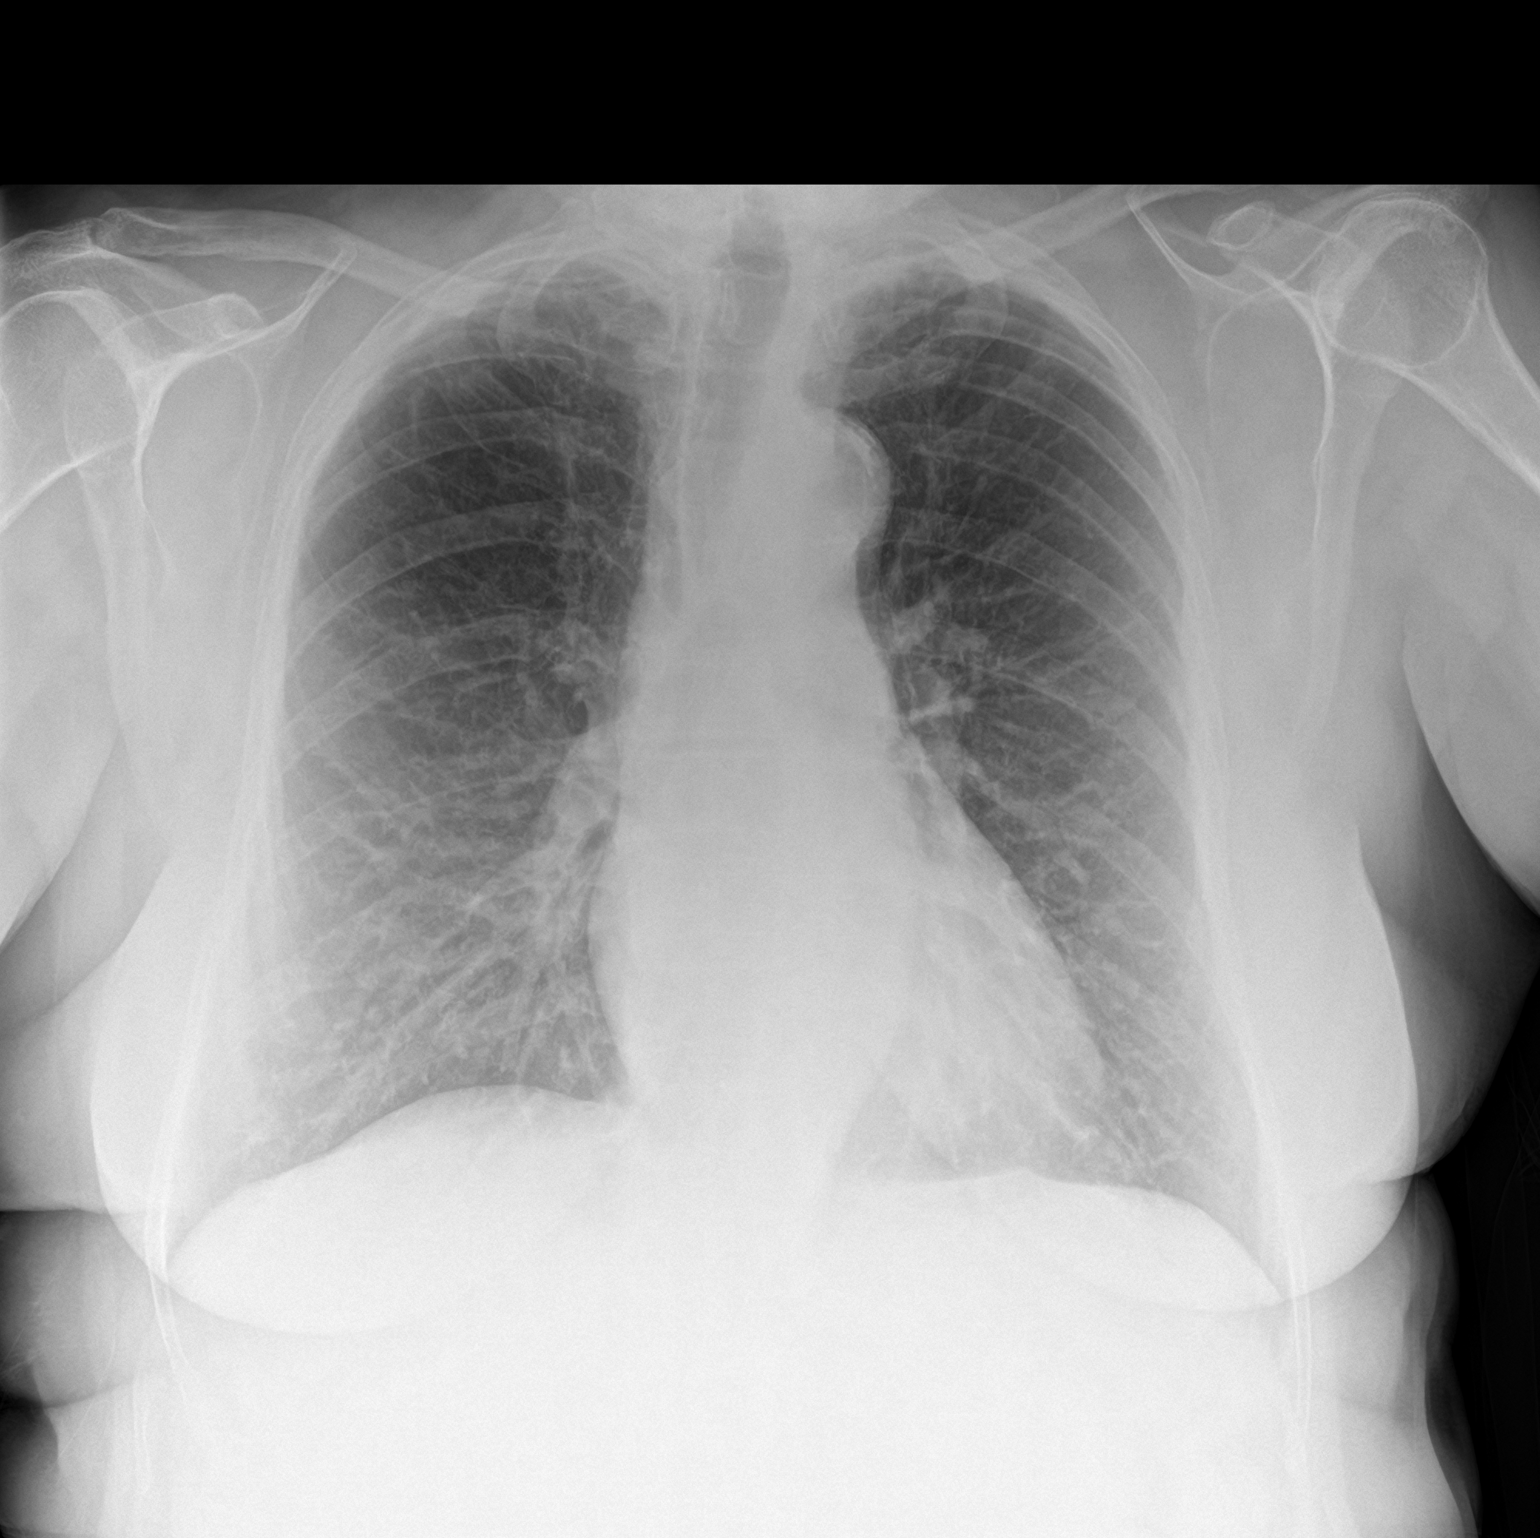

[chest lat]
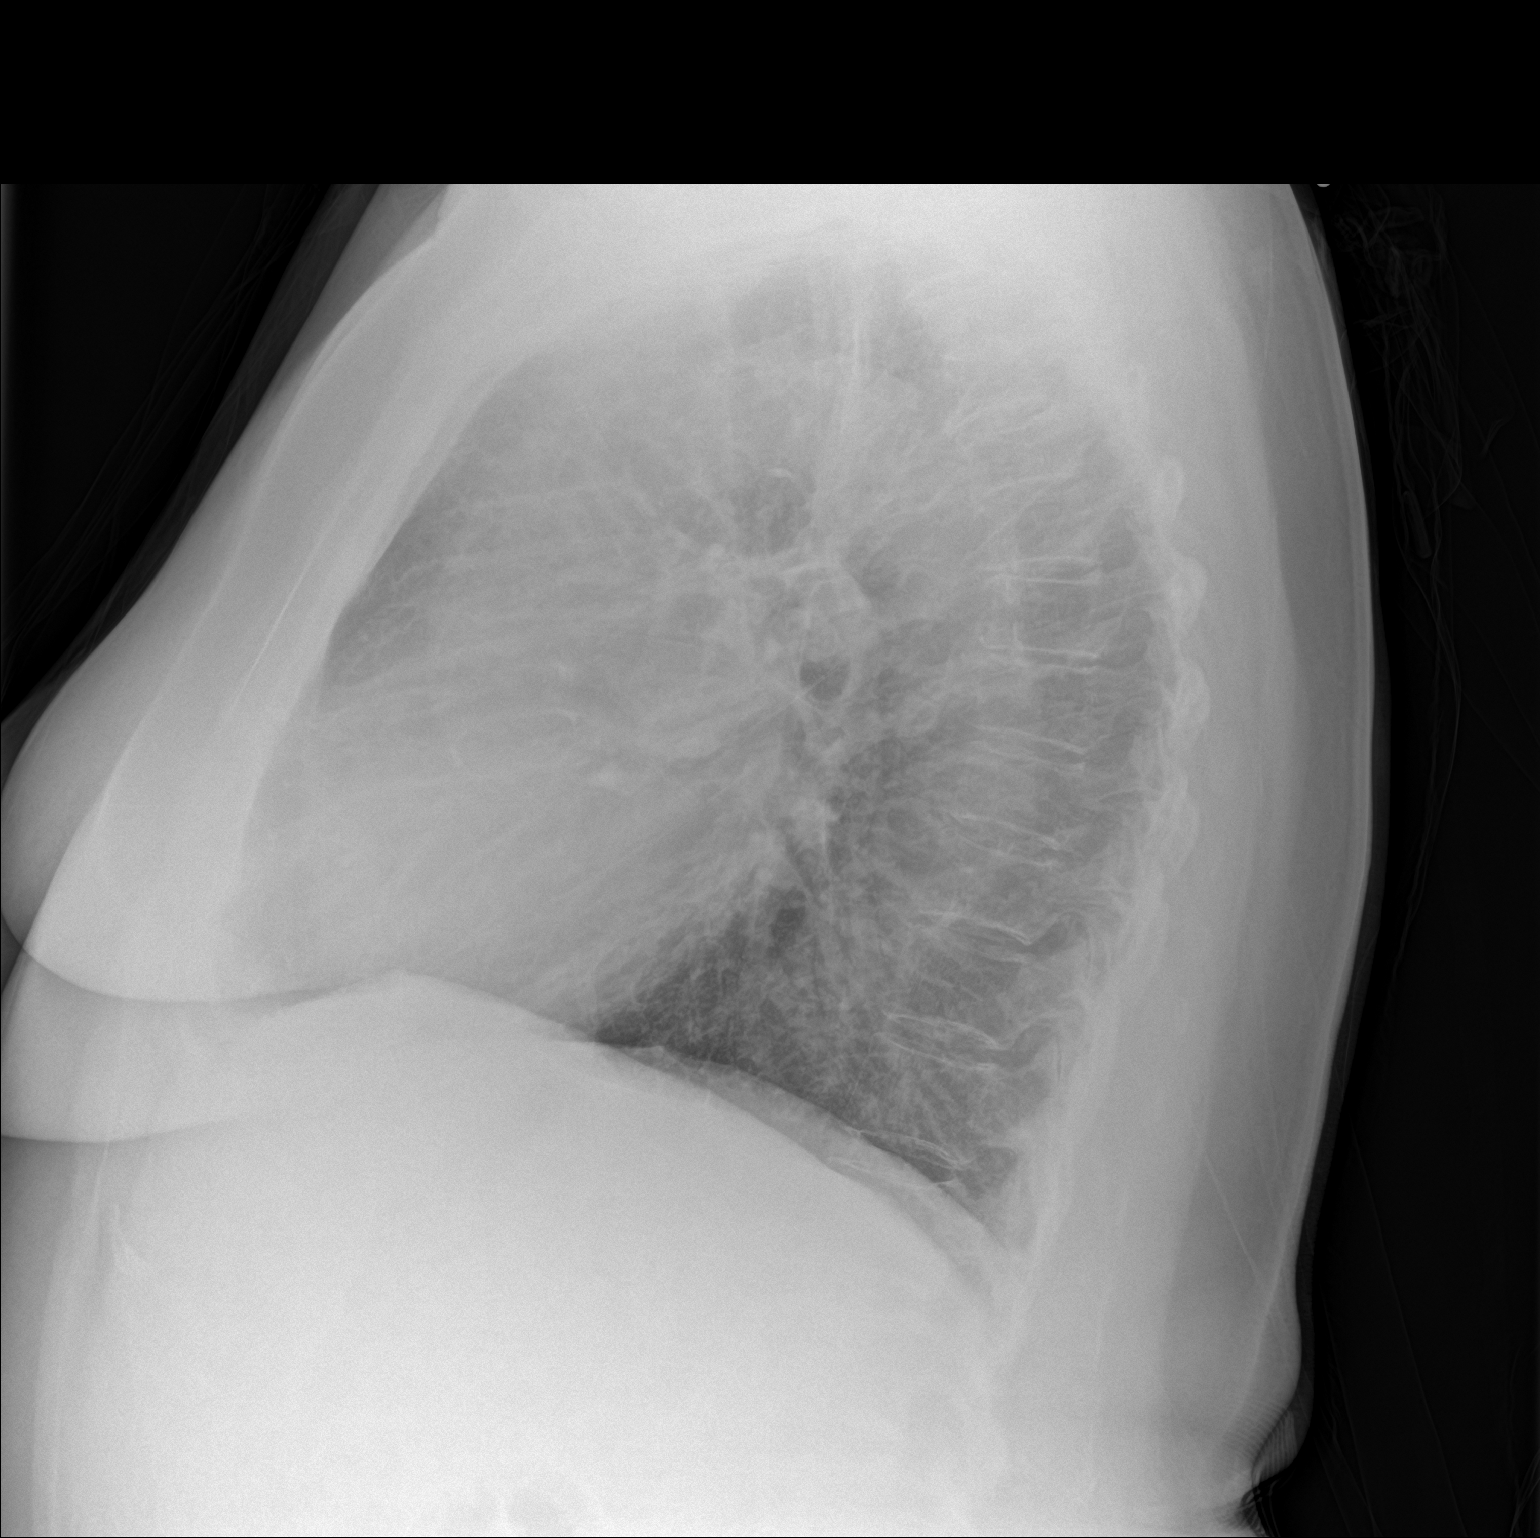

[2 of 2 positions shown; findings below may reference images not displayed]

FINDINGS: Mildly increased basilar predominant interstitial markings in both
lungs, but might in part be related to radiographic technique.
Stable and normal lung volumes. No pneumothorax, pleural effusion,
discrete pulmonary nodule, or consolidation. Stable visualized
osseous structures. Mediastinal contours remain normal.

Calcified aortic atherosclerosis.
IMPRESSION: Suggestion of increased basilar predominant interstitial markings
since [REDACTED], but might be artifact due to technique. Otherwise top
differential considerations would include pulmonary vascular
congestion and viral/atypical respiratory infection.

No other acute cardiopulmonary abnormality.

## 2017-11-02 ENCOUNTER — Other Ambulatory Visit: Payer: Self-pay | Admitting: Internal Medicine

## 2017-11-03 ENCOUNTER — Other Ambulatory Visit: Payer: Self-pay | Admitting: Internal Medicine

## 2017-11-04 ENCOUNTER — Other Ambulatory Visit: Payer: Self-pay | Admitting: Internal Medicine

## 2017-11-04 MED ORDER — BUTALBITAL-ACETAMINOPHEN 50-325 MG PO TABS: 1.0000 | ORAL_TABLET | Freq: Every day | ORAL | 0 refills | Status: DC | PRN

## 2017-11-04 NOTE — Telephone Encounter (Signed)
Are you ok with alternative?

## 2017-11-04 NOTE — Telephone Encounter (Signed)
Are you ok with sending in a 90 day supply of this medication.

## 2017-11-08 ENCOUNTER — Other Ambulatory Visit: Payer: Self-pay

## 2017-11-08 MED ORDER — LOSARTAN POTASSIUM 100 MG PO TABS: ORAL_TABLET | ORAL | 0 refills | Status: DC

## 2017-11-27 ENCOUNTER — Other Ambulatory Visit: Payer: Self-pay | Admitting: Internal Medicine

## 2017-11-28 ENCOUNTER — Other Ambulatory Visit: Payer: Self-pay | Admitting: Internal Medicine

## 2017-12-05 ENCOUNTER — Other Ambulatory Visit: Payer: Self-pay

## 2017-12-05 ENCOUNTER — Encounter: Payer: Self-pay | Admitting: Internal Medicine

## 2017-12-05 MED ORDER — TELMISARTAN 80 MG PO TABS: 80.0000 mg | ORAL_TABLET | Freq: Every day | ORAL | 1 refills | Status: DC

## 2017-12-11 ENCOUNTER — Other Ambulatory Visit: Payer: Self-pay | Admitting: Internal Medicine

## 2018-01-14 DIAGNOSIS — M25562 Pain in left knee: Secondary | ICD-10-CM | POA: Insufficient documentation

## 2018-01-15 ENCOUNTER — Ambulatory Visit: Payer: Self-pay | Admitting: *Deleted

## 2018-01-15 NOTE — Telephone Encounter (Signed)
"  Pt states that she has seen [facility] and was told by them to call and speak with Dr. Quay Burow nurse to discuss the medication she is on and the medication that they want to prescribe for her to make sure that taking everything is okay because of kidney disease. Prednisone 12 pack"             Pt called back regarding message above. She went to [facility] for pain between her shoulder blades. She saw Dr. Gladstone Lighter and he is telling her that she has arthritis in her neck and wants to try Prednisone.  Wanted to check with Dr. Quay Burow before starting this because of her kidney disease. Also if there is an arthritis medication that Dr. Quay Burow would recommend for her to try. If so, Dr. Gladstone Lighter would like to recommend the patient to a rheumatologist.  Pt is requesting a call back [phone] either before 1:15 or call after 4:00, today, she has a dental appointment. Or reply back thru MyChart today.

## 2018-01-15 NOTE — Telephone Encounter (Signed)
  Reason for Disposition . Caller has URGENT medication question about med that PCP prescribed and triager unable to answer question  Answer Assessment - Initial Assessment Questions 1. REASON FOR CALL or QUESTION: "What is your reason for calling today?" or "How can I best help you?" or "What question do you have that I can help answer?"     A question regarding whether she can take prednisone?  Protocols used: MEDICATION QUESTION CALL-A-AH, INFORMATION ONLY CALL-A-AH

## 2018-01-15 NOTE — Telephone Encounter (Signed)
Left detailed message letting pt know. 

## 2018-01-15 NOTE — Telephone Encounter (Signed)
Ok to take prednisone.   The safest medication for her arthritis is tylenol.  It may a good idea for her to see a rheumatologist

## 2018-01-15 NOTE — Telephone Encounter (Signed)
Please advise if prednisone is ok for her to take due to kidney disease.

## 2018-02-05 ENCOUNTER — Encounter: Payer: Self-pay | Admitting: Internal Medicine

## 2018-02-06 ENCOUNTER — Encounter: Payer: Self-pay | Admitting: Internal Medicine

## 2018-02-06 DIAGNOSIS — I712 Thoracic aortic aneurysm, without rupture, unspecified: Secondary | ICD-10-CM

## 2018-02-06 HISTORY — DX: Thoracic aortic aneurysm, without rupture: I71.2

## 2018-02-06 HISTORY — DX: Thoracic aortic aneurysm, without rupture, unspecified: I71.20

## 2018-03-06 ENCOUNTER — Telehealth: Payer: Self-pay

## 2018-03-06 DIAGNOSIS — E119 Type 2 diabetes mellitus without complications: Secondary | ICD-10-CM

## 2018-03-06 DIAGNOSIS — N189 Chronic kidney disease, unspecified: Secondary | ICD-10-CM

## 2018-03-06 DIAGNOSIS — I10 Essential (primary) hypertension: Secondary | ICD-10-CM

## 2018-03-06 DIAGNOSIS — E7849 Other hyperlipidemia: Secondary | ICD-10-CM

## 2018-03-06 NOTE — Telephone Encounter (Signed)
Copied from [place] [id number]. Topic: General - Other >> Mar 06, 2018  1:45 PM Virl Axe D wrote: Reason for CRM: Pt would like to know if Dr. Quay Burow wants her to have labs done prior to her OV on 03/12/18. Please advise pt.

## 2018-03-06 NOTE — Telephone Encounter (Signed)
Blood work ordered  -- if she can get it done prior great, if not she can do it that day

## 2018-03-07 NOTE — Telephone Encounter (Signed)
Pt aware of response below.  

## 2018-03-11 NOTE — Progress Notes (Signed)
Subjective:    Patient ID: Kerry Powell, female    DOB: 08-02-1945, 73 y.o.   MRN: [id number]  HPI The patient is here for follow up.  Hypertension: She is taking her medication daily. She is compliant with a low sodium diet.  She denies chest pain, palpitations, edema.  She does have chronic shortness of breath at times, especially when she gets severe pain in her mid back.  She also has chronic headaches from her neck pain.  These are unchanged.  She is not exercising regularly.  She does not monitor her blood pressure at home.    Hyperlipidemia: She is taking her medication daily. She is somewhat compliant with a low fat/cholesterol diet. She is not exercising regularly. She denies myalgias.   Diabetes: She is taking her medication daily as prescribed. She is somewhat compliant with a diabetic diet. She is not exercising regularly.  She checks her feet daily and denies foot lesions. She is up-to-date with an ophthalmology examination.   Depression, irritability:  She is taking cymbalta.  Overall she thinks her depression/anxiety/irritability are controlled.  She is happy with her current dose of medication.  Chronic neck pain, neuralgia:  She is getting injections and they help some.  She is following with orthopedics.  She has chronic pain.  GERD:  She is taking her medication daily as prescribed.  She denies any GERD symptoms and feels her GERD is well controlled.   Mid central back pain;  It catches her and the pain is intense.  It occurs when she is active.  It will last 5-10 minutes.  Rubbing the area helps.    Red bump on right forearm - it has been there for 3-4 months.  It itches a lot and she constantly is itching it.  She tried cortisone cream and several creams.  She was wondering if she needed to see dermatology.  Medications and allergies reviewed with patient and updated if appropriate.  Patient Active Problem List   Diagnosis Date Noted  . Thoracic aortic aneurysm  ([place]) 02/06/2018  . Sleep difficulties 09/09/2017  . Nausea and vomiting 09/09/2017  . Depression 07/01/2017  . Dizziness 06/10/2017  . H/O renal cell carcinoma 05/23/2017  . Hyperlipidemia 06/25/2016  . Occipital neuralgia 05/09/2016  . Anxiety 07/26/2015  . Chronic neck pain 04/30/2015  . Diabetes ([place]) 04/27/2015  . History of CVA (cerebrovascular accident) 04/26/2015  . History of gout 04/26/2015  . CKD (chronic kidney disease) 04/26/2015  . Essential hypertension, benign 04/26/2015  . Snoring 04/26/2015  . Localized edema 04/26/2015  . Dysphagia 04/26/2015  . GERD (gastroesophageal reflux disease) 04/26/2015  . Personal history of colonic adenoma 05/29/2007  . HIATAL HERNIA 04/07/2007  . RESTLESS LEG SYNDROME 04/04/2007  . 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 COPD 04/04/2007  . IRRITABLE BOWEL SYNDROME 04/04/2007  . OSTEOARTHRITIS 04/04/2007    Current Outpatient Medications on File Prior to Visit  Medication Sig Dispense Refill  . ACETAMINOPHEN-BUTALBITAL 50-325 MG TABS Take 1 tablet by mouth daily as needed. 90 each 0  . allopurinol (ZYLOPRIM) 300 MG tablet TAKE 1 TABLET BY MOUTH EVERY DAY 90 tablet 1  . amLODipine (NORVASC) 5 MG tablet Take 1.5 tablets (7.5 mg total) by mouth daily. 135 tablet 3  . clopidogrel (PLAVIX) 75 MG tablet TAKE 1 TABLET BY MOUTH EVERY DAY 90 tablet 1  . DULoxetine (CYMBALTA) 30 MG capsule TAKE 30 MG DAILY FOR TWO WEEKS THEN INCREASE TO 60 MG DAILY 180 capsule 0  .  furosemide (LASIX) 20 MG tablet TAKE 1 TABLET (20 MG TOTAL) BY MOUTH EVERY 3 (THREE) DAYS. 30 tablet 2  . ketoconazole (NIZORAL) 2 % cream APPLY TO AFFECTED AREA UP TO TWICE A DAY AS NEEDED    . metFORMIN (GLUCOPHAGE) 1000 MG tablet TAKE 1 TABLET (1,000 MG TOTAL) BY MOUTH 2 (TWO) TIMES DAILY WITH A MEAL. 180 tablet 1  . metoprolol succinate (TOPROL-XL) 25 MG 24 hr tablet TAKE 1 TABLET BY MOUTH EVERY DAY 90 tablet 1  . pantoprazole (PROTONIX) 40 MG tablet TAKE 1 TABLET BY MOUTH EVERY  DAY 90 tablet 1  . rosuvastatin (CRESTOR) 10 MG tablet TAKE 1 TABLET (10 MG TOTAL) BY MOUTH DAILY. 90 tablet 3  . telmisartan (MICARDIS) 80 MG tablet Take 1 tablet (80 mg total) by mouth daily. 90 tablet 1   No current facility-administered medications on file prior to visit.     Past Medical History:  Diagnosis Date  . Anxiety   . Arthritis   . Cataract    removed both eyes  . COPD (chronic obstructive pulmonary disease) ([place])    pt denies - told this while was smoking -  . GASTRIC ULCER, ACUTE 04/07/2007   Annotation: EGD 04/07/07 Qualifier: Diagnosis of  By: Carlean Purl MD, Dimas Millin   . GERD (gastroesophageal reflux disease)   . Hyperlipidemia   . Hypertension   . Kidney tumor (benign)   . Personal history of colonic adenoma 06/07/2012  . Stroke [facility])    x2 2011  . TIA (transient ischemic attack)     Past Surgical History:  Procedure Laterality Date  . ABDOMINAL HYSTERECTOMY    . CATARACT EXTRACTION    . COLONOSCOPY    . CYSTOCELE REPAIR    . ESOPHAGOGASTRODUODENOSCOPY    . KIDNEY SURGERY     benign tumor removal  . ORTHOPEDIC SURGERY     Wrist & elbow  . POLYPECTOMY    . RECTOCELE REPAIR    . UPPER GASTROINTESTINAL ENDOSCOPY      Social History   Socioeconomic History  . Marital status: Married    Spouse name: Not on file  . Number of children: 2  . Years of education: Not on file  . Highest education level: Not on file  Occupational History  . Occupation: Retired  Scientific laboratory technician  . Financial resource strain: Not on file  . Food insecurity:    Worry: Not on file    Inability: Not on file  . Transportation needs:    Medical: Not on file    Non-medical: Not on file  Tobacco Use  . Smoking status: Former Smoker    Last attempt to quit: 06/30/2009    Years since quitting: 8.7  . Smokeless tobacco: Never Used  Substance and Sexual Activity  . Alcohol use: No  . Drug use: No  . Sexual activity: Not on file  Lifestyle  . Physical activity:    Days per week:  Not on file    Minutes per session: Not on file  . Stress: Not on file  Relationships  . Social connections:    Talks on phone: Not on file    Gets together: Not on file    Attends religious service: Not on file    Active member of club or organization: Not on file    Attends meetings of clubs or organizations: Not on file    Relationship status: Not on file  Other Topics Concern  . Not on file  Social  History Narrative   Married, retired   1 son 1 daughter   2 caffeine/day    Family History  Problem Relation Age of Onset  . Congestive Heart Failure Mother   . Cancer Mother        ovarian  . Heart disease Father   . Diabetes Father   . Kidney disease Father   . Diabetes Sister   . Kidney disease Sister   . Colon cancer Neg Hx   . Colon polyps Neg Hx   . Esophageal cancer Neg Hx   . Rectal cancer Neg Hx   . Stomach cancer Neg Hx     Review of Systems  Constitutional: Negative for chills and fever.  Respiratory: Positive for shortness of breath (with mid back pain). Negative for cough and wheezing.   Cardiovascular: Negative for chest pain, palpitations and leg swelling.  Musculoskeletal: Positive for back pain and neck pain.  Neurological: Positive for headaches (right posterior  - chronic). Negative for dizziness and light-headedness.       Objective:   Vitals:   03/12/18 1327  BP: (!) 142/84  Pulse: 79  Resp: 16  Temp: 97.9 F (36.6 C)  SpO2: 95%   BP Readings from Last 3 Encounters:  03/12/18 (!) 142/84  09/09/17 126/74  07/01/17 126/80   Wt Readings from Last 3 Encounters:  03/12/18 229 lb (103.9 kg)  09/09/17 234 lb (106.1 kg)  07/01/17 234 lb (106.1 kg)   Body mass index is 38.11 kg/m.   Physical Exam    Constitutional: Appears well-developed and well-nourished. No distress.  HENT:  Head: Normocephalic and atraumatic.  Neck: Neck supple. No tracheal deviation present. No thyromegaly present.  No cervical lymphadenopathy Cardiovascular:  Normal rate, regular rhythm and normal heart sounds.   No murmur heard. No carotid bruit .  Mild bilateral lower extremity edema Pulmonary/Chest: Effort normal and breath sounds normal. No respiratory distress. No has no wheezes. No rales.  Skin: Skin is warm and dry. Not diaphoretic.  Red circular raised lesion right anterior forearm without surrounding swelling or erythema-we will see dermatology Psychiatric: Normal mood and affect. Behavior is normal.      Assessment & Plan:    See Problem List for Assessment and Plan of chronic medical problems.

## 2018-03-12 ENCOUNTER — Encounter: Payer: Self-pay | Admitting: Internal Medicine

## 2018-03-12 ENCOUNTER — Other Ambulatory Visit (INDEPENDENT_AMBULATORY_CARE_PROVIDER_SITE_OTHER): Payer: Medicare Other

## 2018-03-12 ENCOUNTER — Ambulatory Visit: Payer: Medicare Other | Admitting: Internal Medicine

## 2018-03-12 VITALS — BP 142/84 | HR 79 | Temp 97.9°F | Resp 16 | Ht 65.0 in | Wt 229.0 lb

## 2018-03-12 DIAGNOSIS — N189 Chronic kidney disease, unspecified: Secondary | ICD-10-CM

## 2018-03-12 DIAGNOSIS — I712 Thoracic aortic aneurysm, without rupture, unspecified: Secondary | ICD-10-CM

## 2018-03-12 DIAGNOSIS — F3289 Other specified depressive episodes: Secondary | ICD-10-CM

## 2018-03-12 DIAGNOSIS — E7849 Other hyperlipidemia: Secondary | ICD-10-CM

## 2018-03-12 DIAGNOSIS — E119 Type 2 diabetes mellitus without complications: Secondary | ICD-10-CM | POA: Diagnosis not present

## 2018-03-12 DIAGNOSIS — I10 Essential (primary) hypertension: Secondary | ICD-10-CM

## 2018-03-12 DIAGNOSIS — F419 Anxiety disorder, unspecified: Secondary | ICD-10-CM | POA: Diagnosis not present

## 2018-03-12 DIAGNOSIS — E559 Vitamin D deficiency, unspecified: Secondary | ICD-10-CM | POA: Insufficient documentation

## 2018-03-12 HISTORY — DX: Vitamin D deficiency, unspecified: E55.9

## 2018-03-12 LAB — CBC WITH DIFFERENTIAL/PLATELET
Basophils Absolute: 0 10*3/uL (ref 0.0–0.1)
Basophils Relative: 0.5 % (ref 0.0–3.0)
Eosinophils Absolute: 0.4 10*3/uL (ref 0.0–0.7)
Eosinophils Relative: 3.2 % (ref 0.0–5.0)
HCT: 37.9 % (ref 36.0–46.0)
Hemoglobin: 12 g/dL (ref 12.0–15.0)
Lymphocytes Relative: 19.9 % (ref 12.0–46.0)
Lymphs Abs: 2.2 10*3/uL (ref 0.7–4.0)
MCHC: 31.7 g/dL (ref 30.0–36.0)
MCV: 85.9 fl (ref 78.0–100.0)
Monocytes Absolute: 1.1 10*3/uL — ABNORMAL HIGH (ref 0.1–1.0)
Monocytes Relative: 10.1 % (ref 3.0–12.0)
Neutro Abs: 7.2 10*3/uL (ref 1.4–7.7)
Neutrophils Relative %: 66.3 % (ref 43.0–77.0)
Platelets: 343 10*3/uL (ref 150.0–400.0)
RBC: 4.41 Mil/uL (ref 3.87–5.11)
RDW: 15.7 % — ABNORMAL HIGH (ref 11.5–15.5)
WBC: 10.9 10*3/uL — ABNORMAL HIGH (ref 4.0–10.5)

## 2018-03-12 LAB — COMPREHENSIVE METABOLIC PANEL
ALT: 10 U/L (ref 0–35)
AST: 12 U/L (ref 0–37)
Albumin: 4.2 g/dL (ref 3.5–5.2)
Alkaline Phosphatase: 114 U/L (ref 39–117)
BUN: 24 mg/dL — ABNORMAL HIGH (ref 6–23)
CO2: 28 mEq/L (ref 19–32)
Calcium: 9.7 mg/dL (ref 8.4–10.5)
Chloride: 102 mEq/L (ref 96–112)
Creatinine, Ser: 1.43 mg/dL — ABNORMAL HIGH (ref 0.40–1.20)
GFR: 38.23 mL/min — ABNORMAL LOW (ref >60.00)
Glucose, Bld: 131 mg/dL — ABNORMAL HIGH (ref 70–99)
Potassium: 4.2 mEq/L (ref 3.5–5.1)
Sodium: 136 mEq/L (ref 135–145)
Total Bilirubin: 0.5 mg/dL (ref 0.2–1.2)
Total Protein: 7.3 g/dL (ref 6.0–8.3)

## 2018-03-12 LAB — VITAMIN D 25 HYDROXY (VIT D DEFICIENCY, FRACTURES): VITD: 28 ng/mL — ABNORMAL LOW (ref 30.00–100.00)

## 2018-03-12 LAB — LIPID PANEL
Cholesterol: 151 mg/dL (ref 0–200)
HDL: 38 mg/dL — ABNORMAL LOW (ref >39.00)
NonHDL: 112.98
Total CHOL/HDL Ratio: 4
Triglycerides: 211 mg/dL — ABNORMAL HIGH (ref 0.0–149.0)
VLDL: 42.2 mg/dL — ABNORMAL HIGH (ref 0.0–40.0)

## 2018-03-12 LAB — LDL CHOLESTEROL, DIRECT: Direct LDL: 92 mg/dL

## 2018-03-12 LAB — HEMOGLOBIN A1C: Hgb A1c MFr Bld: 7.5 % — ABNORMAL HIGH (ref 4.6–6.5)

## 2018-03-12 LAB — TSH: TSH: 3.47 u[IU]/mL (ref 0.35–4.50)

## 2018-03-12 NOTE — Assessment & Plan Note (Signed)
Check lipid panel  Continue daily statin  healthy diet encouraged  

## 2018-03-12 NOTE — Assessment & Plan Note (Signed)
Check vitamin d level Finished high dose  Taking 1000 units daily

## 2018-03-12 NOTE — Patient Instructions (Addendum)
  Tests ordered today. Your results will be released to MyChart (or called to you) after review, usually within 72hours after test completion. If any changes need to be made, you will be notified at that same time.  Medications reviewed and updated.  Changes include :   none      Please followup in 6 months   

## 2018-03-12 NOTE — Assessment & Plan Note (Signed)
Controlled, stable Continue current dose of medication  

## 2018-03-12 NOTE — Assessment & Plan Note (Signed)
BP Readings from Last 3 Encounters:  03/12/18 (!) 142/84  09/09/17 126/74  07/01/17 126/80   BP well controlled Current regimen effective and well tolerated Continue current medications at current doses cmp

## 2018-03-12 NOTE — Assessment & Plan Note (Signed)
CMP Avoid NSAIDs

## 2018-03-12 NOTE — Assessment & Plan Note (Signed)
Check A1c Continue metformin-we will adjust dose if needed Stressed low sugar/carbohydrate diet Follow-up in 6 months

## 2018-03-12 NOTE — Assessment & Plan Note (Signed)
Will see CTS at [facility]

## 2018-03-13 ENCOUNTER — Encounter: Payer: Self-pay | Admitting: Internal Medicine

## 2018-03-15 ENCOUNTER — Other Ambulatory Visit: Payer: Self-pay | Admitting: Internal Medicine

## 2018-03-15 ENCOUNTER — Encounter: Payer: Self-pay | Admitting: Internal Medicine

## 2018-03-15 DIAGNOSIS — R0602 Shortness of breath: Secondary | ICD-10-CM

## 2018-03-15 DIAGNOSIS — M549 Dorsalgia, unspecified: Secondary | ICD-10-CM

## 2018-03-15 MED ORDER — DAPAGLIFLOZIN PROPANEDIOL 5 MG PO TABS: 5.0000 mg | ORAL_TABLET | Freq: Every day | ORAL | 5 refills | Status: DC

## 2018-03-18 MED ORDER — GLIMEPIRIDE 2 MG PO TABS: 2.0000 mg | ORAL_TABLET | Freq: Every day | ORAL | 5 refills | Status: DC

## 2018-03-30 ENCOUNTER — Other Ambulatory Visit: Payer: Self-pay | Admitting: Internal Medicine

## 2018-04-11 ENCOUNTER — Encounter: Payer: Self-pay | Admitting: Internal Medicine

## 2018-04-11 ENCOUNTER — Telehealth: Payer: Self-pay | Admitting: *Deleted

## 2018-04-11 ENCOUNTER — Other Ambulatory Visit: Payer: Self-pay | Admitting: Internal Medicine

## 2018-04-11 MED ORDER — DULOXETINE HCL 60 MG PO CPEP: 60.0000 mg | ORAL_CAPSULE | Freq: Every day | ORAL | 0 refills | Status: DC

## 2018-04-11 NOTE — Telephone Encounter (Signed)
Pt sent email needing rx for her cymbalta. Pt is now taking 60 mg since updated script for 60 mg to [facility]

## 2018-04-17 ENCOUNTER — Encounter: Payer: Self-pay | Admitting: Cardiology

## 2018-04-17 ENCOUNTER — Ambulatory Visit: Payer: Medicare Other | Admitting: Cardiology

## 2018-04-17 VITALS — BP 142/80 | HR 99 | Ht 65.0 in | Wt 230.0 lb

## 2018-04-17 DIAGNOSIS — I712 Thoracic aortic aneurysm, without rupture, unspecified: Secondary | ICD-10-CM

## 2018-04-17 DIAGNOSIS — I10 Essential (primary) hypertension: Secondary | ICD-10-CM | POA: Diagnosis not present

## 2018-04-17 DIAGNOSIS — R0609 Other forms of dyspnea: Secondary | ICD-10-CM

## 2018-04-17 DIAGNOSIS — R0600 Dyspnea, unspecified: Secondary | ICD-10-CM

## 2018-04-17 DIAGNOSIS — R0602 Shortness of breath: Secondary | ICD-10-CM | POA: Insufficient documentation

## 2018-04-17 NOTE — Patient Instructions (Signed)
Medication Instructions:  Your physician recommends that you continue on your current medications as directed. Please refer to the Current Medication list given to you today.  If you need a refill on your cardiac medications before your next appointment, please call your pharmacy.   Lab work: Today: BMET  If you have labs (blood work) drawn today and your tests are completely normal, you will receive your results only by: [name] MyChart Message (if you have MyChart) OR . A paper copy in the mail If you have any lab test that is abnormal or we need to change your treatment, we will call you to review the results.  Testing/Procedures: Your physician has requested that you have an echocardiogram. Echocardiography is a painless test that uses sound waves to create images of your heart. It provides your doctor with information about the size and shape of your heart and how well your heart's chambers and valves are working. This procedure takes approximately one hour. There are no restrictions for this procedure.  Your physician has requested that you have a lexiscan myoview. For further information please visit [email or web address]. Please follow instruction sheet, as given.  Schedule around 04/18/19, Non-Cardiac CT Angiography (CTA), is a special type of CT scan that uses a computer to produce multi-dimensional views of major blood vessels throughout the body. In CT angiography, a contrast material is injected through an IV to help visualize the blood vessels  Non-Cardiac CT Angiography (CTA) of Abdomen and Pelvis, is a special type of CT scan that uses a computer to produce multi-dimensional views of major blood vessels throughout the body. In CT angiography, a contrast material is injected through an IV to help visualize the blood vessels   Follow-Up: As needed.

## 2018-04-17 NOTE — Progress Notes (Signed)
Cardiology Office Note    Date:  04/17/2018   ID:  Charmain, Diosdado [birth year], MRN [id number]  PCP:  Binnie Rail, MD  Cardiologist:  Fransico Him, MD   Chief Complaint  Patient presents with  . New Patient (Initial Visit)    SOB    History of Present Illness:  Kerry Powell is a 73 y.o. female who is being seen today for the evaluation of SOB at the request of Burns, [name], MD.  This is a 73yo female with a history of CKD, COPD, DM, CVA, HTN, thoracic aortic aneurysm followed at [facility].      Past Medical History:  Diagnosis Date  . Anxiety   . Arthritis   . BACK PAIN, CHRONIC 04/04/2007   Qualifier: Diagnosis of  By: Carlean Purl MD, Dimas Millin BIPOLAR AFFECTIVE DISORDER 04/04/2007   Qualifier: History of  By: Carlean Purl MD, Dimas Millin Cataract    removed both eyes  . Chronic neck pain 04/30/2015   Cervical spondylosis [facility] Intermittent numbness and tingling in occipital region and headaches S/p 2 occipital nerve blocks - only temp relief no relief with gabapentin, ultram, brace MRI neck - facet arthrosis   . CKD (chronic kidney disease) 04/26/2015  . COPD (chronic obstructive pulmonary disease) ([place])    pt denies - told this while was smoking -  . Cough 04/26/2015  . Depression 07/01/2017  . Diabetes ([place]) 04/27/2015   Diagnosed 03/2015   . Dizziness 06/10/2017  . Dysphagia 04/26/2015  . DYSPNEA 12/24/2007   Qualifier: Diagnosis of  By: Glennis Brink    . Elevated TSH 08/09/2015  . GASTRIC ULCER, ACUTE 04/07/2007   Annotation: EGD 04/07/07 Qualifier: Diagnosis of  By: Carlean Purl MD, FACG, [place] 04/07/2007   Annotation: EGD 04/07/07 Qualifier: Diagnosis of  By: Carlean Purl MD, Dimas Millin   . GERD (gastroesophageal reflux disease)   . H/O renal cell carcinoma 05/23/2017   Dx 2010, s/p cryoablation Follows with urology - Dr Lovena Neighbours  . HIATAL HERNIA 04/07/2007   Annotation: EGD 04/07/07 Qualifier: Diagnosis of  By: Carlean Purl MD, Dimas Millin  History of CVA (cerebrovascular accident) 04/26/2015  . History of gout 04/26/2015  . Hyperlipidemia   . Hypertension   . Irritable bowel syndrome 04/04/2007   Qualifier: Diagnosis of  By: Carlean Purl MD, Dimas Millin Kidney tumor (benign)   . Nausea and vomiting 09/09/2017  . 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 OSTEOARTHRITIS 04/04/2007   Qualifier: Diagnosis of  By: Carlean Purl MD, Dimas Millin Personal history of colonic adenoma 06/07/2012  . POSTNASAL DRIP SYNDROME 12/29/2007   Qualifier: Diagnosis of  By: Chase Caller MD, [name]    . RESTLESS LEG SYNDROME 04/04/2007   Qualifier: Diagnosis of  By: Carlean Purl MD, FACG, [place], 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   Qualifier: Diagnosis of  By: Carlean Purl MD, Dimas Millin Sleep difficulties 09/09/2017  . Snoring 04/26/2015  . Stroke [facility])    x2 2011  . Thoracic aortic aneurysm ([place]) 02/06/2018   Seen on MRI 01/25/18 of thoracic spine ordered by [facility] -- referred to CTS to monitor  -- 3.5 cm  . TIA (transient ischemic attack)   . TOBACCO ABUSE 12/17/2007   Qualifier: Diagnosis of  By: Chase Caller MD, [name]    .  Trigeminal neuralgia of right side of face 02/08/2016  . URI (upper respiratory infection) 02/08/2016  . Vitamin D deficiency 03/12/2018    Past Surgical History:  Procedure Laterality Date  . ABDOMINAL HYSTERECTOMY    . CATARACT EXTRACTION    . COLONOSCOPY    . CYSTOCELE REPAIR    . ESOPHAGOGASTRODUODENOSCOPY    . KIDNEY SURGERY     benign tumor removal  . ORTHOPEDIC SURGERY     Wrist & elbow  . POLYPECTOMY    . RECTOCELE REPAIR    . UPPER GASTROINTESTINAL ENDOSCOPY      Current Medications: Current Meds  Medication Sig  . ACETAMINOPHEN-BUTALBITAL 50-325 MG TABS Take 1 tablet by mouth daily as needed.  [name] allopurinol (ZYLOPRIM) 300 MG tablet TAKE 1 TABLET BY MOUTH EVERY DAY  . amLODipine (NORVASC) 5 MG tablet Take 1.5 tablets (7.5 mg total) by mouth daily.  .  clopidogrel (PLAVIX) 75 MG tablet TAKE 1 TABLET BY MOUTH EVERY DAY  . DULoxetine (CYMBALTA) 60 MG capsule Take 1 capsule (60 mg total) by mouth daily.  . furosemide (LASIX) 20 MG tablet TAKE 1 TABLET (20 MG TOTAL) BY MOUTH EVERY 3 (THREE) DAYS.  [name] ketoconazole (NIZORAL) 2 % cream APPLY TO AFFECTED AREA UP TO TWICE A DAY AS NEEDED  . metFORMIN (GLUCOPHAGE) 1000 MG tablet TAKE 1 TABLET (1,000 MG TOTAL) BY MOUTH 2 (TWO) TIMES DAILY WITH A MEAL.  . metoprolol succinate (TOPROL-XL) 25 MG 24 hr tablet TAKE 1 TABLET BY MOUTH EVERY DAY  . pantoprazole (PROTONIX) 40 MG tablet TAKE 1 TABLET BY MOUTH EVERY DAY  . rosuvastatin (CRESTOR) 10 MG tablet TAKE 1 TABLET (10 MG TOTAL) BY MOUTH DAILY.  [name] telmisartan (MICARDIS) 80 MG tablet Take 1 tablet (80 mg total) by mouth daily.    Allergies:   Gabapentin; Clindamycin; and Sulfa antibiotics   Social History   Socioeconomic History  . Marital status: Married    Spouse name: Not on file  . Number of children: 2  . Years of education: Not on file  . Highest education level: Not on file  Occupational History  . Occupation: Retired  Scientific laboratory technician  . Financial resource strain: Not on file  . Food insecurity:    Worry: Not on file    Inability: Not on file  . Transportation needs:    Medical: Not on file    Non-medical: Not on file  Tobacco Use  . Smoking status: Former Smoker    Last attempt to quit: 06/30/2009    Years since quitting: 8.8  . Smokeless tobacco: Never Used  Substance and Sexual Activity  . Alcohol use: No  . Drug use: No  . Sexual activity: Not on file  Lifestyle  . Physical activity:    Days per week: Not on file    Minutes per session: Not on file  . Stress: Not on file  Relationships  . Social connections:    Talks on phone: Not on file    Gets together: Not on file    Attends religious service: Not on file    Active member of club or organization: Not on file    Attends meetings of clubs or organizations: Not on file     Relationship status: Not on file  Other Topics Concern  . Not on file  Social History Narrative   Married, retired   1 son 1 daughter   2 caffeine/day     Family History:  The patient's family history includes  Cancer in her mother; Congestive Heart Failure in her mother; Diabetes in her father and sister; Heart disease in her father; Kidney disease in her father and sister.   ROS:   Please see the history of present illness.    ROS All other systems reviewed and are negative.  No flowsheet data found.     PHYSICAL EXAM:   VS:  BP (!) 142/80   Pulse 99   Ht 5\' 5"  (1.651 m)   Wt 230 lb (104.3 kg)   SpO2 93%   BMI 38.27 kg/m    GEN: Well nourished, well developed, in no acute distress  HEENT: normal  Neck: no JVD, carotid bruits, or masses Cardiac: RRR; no murmurs, rubs, or gallops,no edema.  Intact distal pulses bilaterally.  Respiratory:  clear to auscultation bilaterally, normal work of breathing GI: soft, nontender, nondistended, + BS MS: no deformity or atrophy  Skin: warm and dry, no rash Neuro:  Alert and Oriented x 3, Strength and sensation are intact Psych: euthymic mood, full affect  Wt Readings from Last 3 Encounters:  04/17/18 230 lb (104.3 kg)  03/12/18 229 lb (103.9 kg)  09/09/17 234 lb (106.1 kg)      Studies/Labs Reviewed:   EKG:  EKG is not ordered today.   Recent Labs: 03/12/2018: ALT 10; BUN 24; Creatinine, Ser 1.43; Hemoglobin 12.0; Platelets 343.0; Potassium 4.2; Sodium 136; TSH 3.47   Lipid Panel    Component Value Date/Time   CHOL 151 03/12/2018 1407   TRIG 211.0 (H) 03/12/2018 1407   HDL 38.00 (L) 03/12/2018 1407   CHOLHDL 4 03/12/2018 1407   VLDL 42.2 (H) 03/12/2018 1407   LDLCALC 79 06/10/2017 1204   LDLDIRECT 92.0 03/12/2018 1407    Additional studies/ records that were reviewed today include:  Office notes from PCP and EKG    ASSESSMENT:    1. Thoracic aortic aneurysm without rupture ([place])   2. Essential hypertension,  benign   3. DOE (dyspnea on exertion)      PLAN:  In order of problems listed above:  1.  Thoracic aortic aneurysm  - last Chest CT showed a mild descending fusiform aortic aneurysm 3.5cm. I will get an abdominal CTA and pelvis as well to assess the rest of her aorta .  If that is normal then we will repeat a chest CTA in 1 year to assess for stability of her aneurysm.  BP is controlled.  Continue statin.   2.  HTN - BP is controlled on exam today.  Continue amlodipine 7.5mg  daily, Toprol XL 25mg  daily and Micardis 80mg  daily.    3.  DOE - she has had problems recently with SOB and pain between her shoulder blades when she exerts herself.  She says that it will get very intense to the point her husband will have to massage her back and then radiates into her back. She has CRFs including tobacco use, PVD, HTN, DM and hyperlipidemia.  This may be an anginal equivalent.  I will get a 2D echo and lexiscan myoview to assess LVF and rule out coronary ischemia.     Medication Adjustments/Labs and Tests Ordered: Current medicines are reviewed at length with the patient today.  Concerns regarding medicines are outlined above.  Medication changes, Labs and Tests ordered today are listed in the Patient Instructions below.  Patient Instructions  Medication Instructions:  Your physician recommends that you continue on your current medications as directed. Please refer to the Current Medication list given  to you today.  If you need a refill on your cardiac medications before your next appointment, please call your pharmacy.   Lab work: Today: BMET  If you have labs (blood work) drawn today and your tests are completely normal, you will receive your results only by: [name] MyChart Message (if you have MyChart) OR . A paper copy in the mail If you have any lab test that is abnormal or we need to change your treatment, we will call you to review the results.  Testing/Procedures: Your physician has  requested that you have an echocardiogram. Echocardiography is a painless test that uses sound waves to create images of your heart. It provides your doctor with information about the size and shape of your heart and how well your heart's chambers and valves are working. This procedure takes approximately one hour. There are no restrictions for this procedure.  Your physician has requested that you have a lexiscan myoview. For further information please visit [email or web address]. Please follow instruction sheet, as given.  Schedule around 04/18/19, Non-Cardiac CT Angiography (CTA), is a special type of CT scan that uses a computer to produce multi-dimensional views of major blood vessels throughout the body. In CT angiography, a contrast material is injected through an IV to help visualize the blood vessels  Non-Cardiac CT Angiography (CTA) of Abdomen and Pelvis, is a special type of CT scan that uses a computer to produce multi-dimensional views of major blood vessels throughout the body. In CT angiography, a contrast material is injected through an IV to help visualize the blood vessels   Follow-Up: As needed.       Signed, Fransico Him, MD  04/17/2018 2:31 PM    [address], [place], [place]  [address] Phone: [phone]; Fax: [phone]

## 2018-04-18 LAB — BASIC METABOLIC PANEL
BUN/Creatinine Ratio: 14 (ref 12–28)
BUN: 21 mg/dL (ref 8–27)
CO2: 23 mmol/L (ref 20–29)
Calcium: 9.8 mg/dL (ref 8.7–10.3)
Chloride: 100 mmol/L (ref 96–106)
Creatinine, Ser: 1.5 mg/dL — ABNORMAL HIGH (ref 0.57–1.00)
GFR calc Af Amer: 40 mL/min/{1.73_m2} — ABNORMAL LOW (ref >59)
GFR calc non Af Amer: 34 mL/min/{1.73_m2} — ABNORMAL LOW (ref >59)
Glucose: 107 mg/dL — ABNORMAL HIGH (ref 65–99)
Potassium: 4.6 mmol/L (ref 3.5–5.2)
Sodium: 139 mmol/L (ref 134–144)

## 2018-04-22 ENCOUNTER — Telehealth (HOSPITAL_COMMUNITY): Payer: Self-pay

## 2018-04-22 NOTE — Telephone Encounter (Signed)
Patient given detailed instructions for there test. Patient stated that she would be here for her test. S.Jamin Humphries EMTP

## 2018-04-24 ENCOUNTER — Ambulatory Visit (HOSPITAL_BASED_OUTPATIENT_CLINIC_OR_DEPARTMENT_OTHER): Payer: Medicare Other

## 2018-04-24 ENCOUNTER — Ambulatory Visit (HOSPITAL_COMMUNITY): Payer: Medicare Other | Attending: Internal Medicine

## 2018-04-24 DIAGNOSIS — R0609 Other forms of dyspnea: Secondary | ICD-10-CM | POA: Insufficient documentation

## 2018-04-24 DIAGNOSIS — R0600 Dyspnea, unspecified: Secondary | ICD-10-CM

## 2018-04-24 LAB — MYOCARDIAL PERFUSION IMAGING
LV dias vol: 73 mL (ref 46–106)
LV sys vol: 23 mL
Peak HR: 91 {beats}/min
Rest HR: 69 {beats}/min
SDS: 3
SRS: 1
SSS: 4
TID: 1.01

## 2018-04-24 LAB — ECHOCARDIOGRAM COMPLETE
Height: 65 in
Weight: 3680 oz

## 2018-04-24 MED ORDER — TECHNETIUM TC 99M TETROFOSMIN IV KIT
32.8000 | PACK | Freq: Once | INTRAVENOUS | Status: AC | PRN
  Administered 2018-04-24: 32.8 via INTRAVENOUS
  Filled 2018-04-24: qty 33

## 2018-04-24 MED ORDER — TECHNETIUM TC 99M TETROFOSMIN IV KIT
10.3000 | PACK | Freq: Once | INTRAVENOUS | Status: AC | PRN
  Administered 2018-04-24: 10.3 via INTRAVENOUS
  Filled 2018-04-24: qty 11

## 2018-04-24 MED ORDER — REGADENOSON 0.4 MG/5ML IV SOLN
0.4000 mg | Freq: Once | INTRAVENOUS | Status: AC
  Administered 2018-04-24: 0.4 mg via INTRAVENOUS

## 2018-04-25 ENCOUNTER — Encounter: Payer: Self-pay | Admitting: Cardiology

## 2018-04-25 ENCOUNTER — Telehealth: Payer: Self-pay

## 2018-04-25 DIAGNOSIS — R0602 Shortness of breath: Secondary | ICD-10-CM

## 2018-04-25 NOTE — Telephone Encounter (Signed)
Follow Up:; ° ° °Returning your call. °

## 2018-04-25 NOTE — Telephone Encounter (Signed)
Spoke with the patient, she expressed understanding. She accepted having a BNP on 04/30/18. She had no further questions.   Notes recorded by Sueanne Margarita, MD on 04/24/2018 at 1:58 PM EST Please have patient come in for a BNP ------  Notes recorded by Sueanne Margarita, MD on 04/24/2018 at 1:57 PM EST Please let patient know that echo showed normal LV function

## 2018-04-25 NOTE — Telephone Encounter (Signed)
Duplicate

## 2018-04-30 ENCOUNTER — Other Ambulatory Visit: Payer: Medicare Other | Admitting: *Deleted

## 2018-04-30 DIAGNOSIS — R0602 Shortness of breath: Secondary | ICD-10-CM

## 2018-05-01 LAB — PRO B NATRIURETIC PEPTIDE: NT-Pro BNP: 171 pg/mL (ref 0–301)

## 2018-05-02 ENCOUNTER — Encounter: Payer: Self-pay | Admitting: Cardiology

## 2018-05-02 NOTE — Telephone Encounter (Signed)
See result note for BNP.

## 2018-05-02 NOTE — Telephone Encounter (Signed)
New Message ° °Patient returning Ben's phone call about lab results. °

## 2018-05-08 ENCOUNTER — Ambulatory Visit (INDEPENDENT_AMBULATORY_CARE_PROVIDER_SITE_OTHER)
Admission: RE | Admit: 2018-05-08 | Discharge: 2018-05-08 | Disposition: A | Discharge status: Home / Self Care | Payer: Medicare Other | Source: Ambulatory Visit | Attending: Cardiology | Admitting: Cardiology

## 2018-05-08 ENCOUNTER — Other Ambulatory Visit: Payer: Self-pay

## 2018-05-08 DIAGNOSIS — I712 Thoracic aortic aneurysm, without rupture, unspecified: Secondary | ICD-10-CM

## 2018-05-08 MED ORDER — IOPAMIDOL (ISOVUE-370) INJECTION 76%
75.0000 mL | Freq: Once | INTRAVENOUS | Status: AC | PRN
  Administered 2018-05-08: 75 mL via INTRAVENOUS

## 2018-05-15 ENCOUNTER — Telehealth: Payer: Self-pay

## 2018-05-15 DIAGNOSIS — I701 Atherosclerosis of renal artery: Secondary | ICD-10-CM

## 2018-05-15 DIAGNOSIS — R935 Abnormal findings on diagnostic imaging of other abdominal regions, including retroperitoneum: Secondary | ICD-10-CM

## 2018-05-15 DIAGNOSIS — N280 Ischemia and infarction of kidney: Secondary | ICD-10-CM

## 2018-05-15 NOTE — Telephone Encounter (Signed)
Patient was scheduled with Vascular surgery on 05/19/18.

## 2018-05-15 NOTE — Telephone Encounter (Signed)
Spoke with the patient, she expressed understanding about having a referral to vascular surgery. She had no further questions. Spoke with the Vascular surgery office, their provider is going to review the patient's case and determine if she can wait two week or not. The office will call back.

## 2018-05-15 NOTE — Telephone Encounter (Signed)
-----   Message from Sueanne Margarita, MD sent at 05/11/2018 11:49 PM EDT ----- Patient has abnormal abdominal CT with evidence of atheromatous plaque and thrombus in the distal descending thoracic aorta, moderate plaque with eccentric thrombus in the juxtarenal aorta.  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 I would like patient seen by vascular surgery early this week

## 2018-05-16 ENCOUNTER — Telehealth (HOSPITAL_COMMUNITY): Payer: Self-pay | Admitting: Rehabilitation

## 2018-05-16 NOTE — Telephone Encounter (Signed)
The above patient or their representative was contacted and gave the following answers to these questions:         Do you have any of the following symptoms? No  Fever                    Cough                   Shortness of breath  Do  you have any of the following other symptoms? No   muscle pain         vomiting,        diarrhea        rash         weakness        red eye        abdominal pain         bruising          bruising or bleeding              joint pain           severe headache    Have you been in contact with someone who was or has been sick in the past 2 weeks? No  Yes                 Unsure                         Unable to assess   Does the person that you were in contact with have any of the following symptoms?   Cough         shortness of breath           muscle pain         vomiting,            diarrhea            rash            weakness           fever            red eye           abdominal pain           bruising  or  bleeding                joint pain                severe headache               Have you  or someone you have been in contact with traveled internationally in the last month?       No  If yes, which countries?   Have you  or someone you have been in contact with traveled outside [place] in th last month?  No       If yes, which state and city?   COMMENTS OR ACTION PLAN FOR THIS PATIENT:

## 2018-05-19 ENCOUNTER — Encounter: Payer: Self-pay | Admitting: Surgery

## 2018-05-19 ENCOUNTER — Ambulatory Visit: Payer: Medicare Other | Admitting: Surgery

## 2018-05-19 ENCOUNTER — Other Ambulatory Visit: Payer: Self-pay

## 2018-05-19 VITALS — BP 128/84 | HR 85 | Resp 20 | Ht 65.0 in | Wt 232.0 lb

## 2018-05-19 DIAGNOSIS — I712 Thoracic aortic aneurysm, without rupture, unspecified: Secondary | ICD-10-CM

## 2018-05-19 NOTE — Progress Notes (Signed)
[facility] [place]  Patient name: Kerry Powell MRN: [id number] DOB: [birth year] Sex: female   REQUESTING PROVIDER:    Dr. Radford Pax   REASON FOR CONSULT:    TAAA  HISTORY OF PRESENT ILLNESS:   Kerry Powell is a 73 y.o. female, who is referred for evaluation of PAD.  She recently had a CT scan that revealed a high grade right renal artery steno she is a former smoker sis with an occluded left renal artery.  Her left kidney has atrophied.  The patient is medically managed for hypertension.  She takes a statin for hypercholesterolemia.  She is a diabetic.  Her most recent hemoglobin A1c is 7.5.  She has chronic renal insufficiency.  Most recent creatinine in February 2020 was 1.5.  She has a thoracic aneurysm being followed by [facility].  She has a history of stroke.    PAST MEDICAL HISTORY    Past Medical History:  Diagnosis Date  . Anxiety   . Arthritis   . BACK PAIN, CHRONIC 04/04/2007   Qualifier: Diagnosis of  By: Carlean Purl MD, Dimas Millin BIPOLAR AFFECTIVE DISORDER 04/04/2007   Qualifier: History of  By: Carlean Purl MD, Dimas Millin Cataract    removed both eyes  . Chronic neck pain 04/30/2015   Cervical spondylosis [facility] Intermittent numbness and tingling in occipital region and headaches S/p 2 occipital nerve blocks - only temp relief no relief with gabapentin, ultram, brace MRI neck - facet arthrosis   . CKD (chronic kidney disease) 04/26/2015  . COPD (chronic obstructive pulmonary disease) ([place])    pt denies - told this while was smoking -  . Cough 04/26/2015  . Depression 07/01/2017  . Diabetes ([place]) 04/27/2015   Diagnosed 03/2015   . Dizziness 06/10/2017  . Dysphagia 04/26/2015  . DYSPNEA 12/24/2007   Qualifier: Diagnosis of  By: Glennis Brink    . Elevated TSH 08/09/2015  . GASTRIC ULCER, ACUTE 04/07/2007   Annotation: EGD 04/07/07 Qualifier: Diagnosis of  By: Carlean Purl MD, FACG, [place] 04/07/2007   Annotation: EGD 04/07/07 Qualifier: Diagnosis of  By: Carlean Purl MD, Dimas Millin   . GERD (gastroesophageal reflux disease)   . H/O renal cell carcinoma 05/23/2017   Dx 2010, s/p cryoablation Follows with urology - Dr Lovena Neighbours  . HIATAL HERNIA 04/07/2007   Annotation: EGD 04/07/07 Qualifier: Diagnosis of  By: Carlean Purl MD, Dimas Millin History of CVA (cerebrovascular accident) 04/26/2015  . History of gout 04/26/2015  . Hyperlipidemia   . Hypertension   . Irritable bowel syndrome 04/04/2007   Qualifier: Diagnosis of  By: Carlean Purl MD, Dimas Millin Kidney tumor (benign)   . Nausea and vomiting 09/09/2017  . 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 OSTEOARTHRITIS 04/04/2007   Qualifier: Diagnosis of  By: Carlean Purl MD, Dimas Millin Personal history of colonic adenoma 06/07/2012  . POSTNASAL DRIP SYNDROME 12/29/2007   Qualifier: Diagnosis of  By: Chase Caller MD, [name]    . RESTLESS LEG SYNDROME 04/04/2007   Qualifier: Diagnosis of  By: Carlean Purl MD, FACG, [place], 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   Qualifier: Diagnosis of  By: Carlean Purl MD, Dimas Millin Sleep difficulties 09/09/2017  . Snoring 04/26/2015  . Stroke [facility])    x2 2011  . Thoracic  aortic aneurysm ([place]) 02/06/2018   Seen on MRI 01/25/18 of thoracic spine ordered by [facility] -- referred to CTS to monitor  -- 3.5 cm  . TIA (transient ischemic attack)   . TOBACCO ABUSE 12/17/2007   Qualifier: Diagnosis of  By: Chase Caller MD, [name]    . Trigeminal neuralgia of right side of face 02/08/2016  . URI (upper respiratory infection) 02/08/2016  . Vitamin D deficiency 03/12/2018     FAMILY HISTORY   Family History  Problem Relation Age of Onset  . Congestive Heart Failure Mother   . Cancer Mother        ovarian  . Heart disease Father   . Diabetes Father   . Kidney disease Father   . Diabetes Sister   . Kidney disease Sister   . Colon cancer Neg Hx   .  Colon polyps Neg Hx   . Esophageal cancer Neg Hx   . Rectal cancer Neg Hx   . Stomach cancer Neg Hx     SOCIAL HISTORY:   Social History   Socioeconomic History  . Marital status: Married    Spouse name: Not on file  . Number of children: 2  . Years of education: Not on file  . Highest education level: Not on file  Occupational History  . Occupation: Retired  Scientific laboratory technician  . Financial resource strain: Not on file  . Food insecurity:    Worry: Not on file    Inability: Not on file  . Transportation needs:    Medical: Not on file    Non-medical: Not on file  Tobacco Use  . Smoking status: Former Smoker    Last attempt to quit: 06/30/2009    Years since quitting: 8.8  . Smokeless tobacco: Never Used  Substance and Sexual Activity  . Alcohol use: No  . Drug use: No  . Sexual activity: Not on file  Lifestyle  . Physical activity:    Days per week: Not on file    Minutes per session: Not on file  . Stress: Not on file  Relationships  . Social connections:    Talks on phone: Not on file    Gets together: Not on file    Attends religious service: Not on file    Active member of club or organization: Not on file    Attends meetings of clubs or organizations: Not on file    Relationship status: Not on file  . Intimate partner violence:    Fear of current or ex partner: Not on file    Emotionally abused: Not on file    Physically abused: Not on file    Forced sexual activity: Not on file  Other Topics Concern  . Not on file  Social History Narrative   Married, retired   1 son 1 daughter   2 caffeine/day    ALLERGIES:    Allergies  Allergen Reactions  . Gabapentin Other (See Comments)    Fluid retention   . Clindamycin Rash  . Sulfa Antibiotics Nausea And Vomiting    CURRENT MEDICATIONS:    Current Outpatient Medications  Medication Sig Dispense Refill  . ACETAMINOPHEN-BUTALBITAL 50-325 MG TABS Take 1 tablet by mouth daily as needed. 90 each 0  .  allopurinol (ZYLOPRIM) 300 MG tablet TAKE 1 TABLET BY MOUTH EVERY DAY 90 tablet 1  . amLODipine (NORVASC) 5 MG tablet Take 1.5 tablets (7.5 mg total) by mouth daily. 135 tablet 3  . clopidogrel (PLAVIX) 75 MG  tablet TAKE 1 TABLET BY MOUTH EVERY DAY 90 tablet 1  . DULoxetine (CYMBALTA) 60 MG capsule Take 1 capsule (60 mg total) by mouth daily. 90 capsule 0  . furosemide (LASIX) 20 MG tablet TAKE 1 TABLET (20 MG TOTAL) BY MOUTH EVERY 3 (THREE) DAYS. 30 tablet 2  . glimepiride (AMARYL) 2 MG tablet Take 1 tablet (2 mg total) by mouth daily before breakfast. 30 tablet 5  . metFORMIN (GLUCOPHAGE) 1000 MG tablet TAKE 1 TABLET (1,000 MG TOTAL) BY MOUTH 2 (TWO) TIMES DAILY WITH A MEAL. 180 tablet 1  . metoprolol succinate (TOPROL-XL) 25 MG 24 hr tablet TAKE 1 TABLET BY MOUTH EVERY DAY 90 tablet 1  . pantoprazole (PROTONIX) 40 MG tablet TAKE 1 TABLET BY MOUTH EVERY DAY 90 tablet 1  . rosuvastatin (CRESTOR) 10 MG tablet TAKE 1 TABLET (10 MG TOTAL) BY MOUTH DAILY. 90 tablet 3  . telmisartan (MICARDIS) 80 MG tablet Take 1 tablet (80 mg total) by mouth daily. 90 tablet 1  . ketoconazole (NIZORAL) 2 % cream APPLY TO AFFECTED AREA UP TO TWICE A DAY AS NEEDED     No current facility-administered medications for this visit.     REVIEW OF SYSTEMS:   [X]  denotes positive finding, [ ]  denotes negative finding Cardiac  Comments:  Chest pain or chest pressure: x   Shortness of breath upon exertion: x   Short of breath when lying flat:    Irregular heart rhythm:        Vascular    Pain in calf, thigh, or hip brought on by ambulation:    Pain in feet at night that wakes you up from your sleep:     Blood clot in your veins:    Leg swelling:  x       Pulmonary    Oxygen at home:    Productive cough:     Wheezing:         Neurologic    Sudden weakness in arms or legs:     Sudden numbness in arms or legs:     Sudden onset of difficulty speaking or slurred speech:    Temporary loss of vision in one eye:      Problems with dizziness:         Gastrointestinal    Blood in stool:      Vomited blood:         Genitourinary    Burning when urinating:     Blood in urine:        Psychiatric    Major depression:         Hematologic    Bleeding problems:    Problems with blood clotting too easily:        Skin    Rashes or ulcers:        Constitutional    Fever or chills:     PHYSICAL EXAM:   Vitals:   05/19/18 0947  BP: 128/84  Pulse: 85  Resp: 20  SpO2: 95%  Weight: 105.2 kg  Height: 5\' 5"  (1.651 m)    GENERAL: The patient is a well-nourished female, in no acute distress. The vital signs are documented above. CARDIAC: There is a regular rate and rhythm.  VASCULAR: Palpable pedal pulses.  2+ pitting edema bilateral lower extremity PULMONARY: Nonlabored respirations MUSCULOSKELETAL: There are no major deformities or cyanosis. NEUROLOGIC: No focal weakness or paresthesias are detected. SKIN: There are no ulcers or rashes noted. PSYCHIATRIC: The patient has a  normal affect.  STUDIES:   I have reviewed her CT scan with the following findings: VASCULAR  1. Interval occlusion of the left renal artery since previous scan. 2. High-grade stenosis of the superior right renal artery. 3. Extensive atheromatous plaque and nonocclusive mural thrombus in the distal descending thoracic and abdominal aorta, without aneurysm or stenosis.  NON-VASCULAR:.  1. No acute findings. 2. Left renal parenchymal atrophy  ASSESSMENT and PLAN   Thoracic aneurysm: Per report, the maximum diameter is 3.5 cm.  Dr. Radford Pax has her scheduled for follow-up in 1 year.  I will also see her in March 2021 to discuss the findings of her CT scan of her thoracic aneurysm  Renal: The patient has an occluded left renal artery which is chronic based on the appearance of the atrophied left kidney.  Her main right renal artery appears to be widely patent and the stenosis identified is in the accessory right  renal artery supplying the lower pole of right kidney.  No intervention of this vessel is recommended.  Leg swelling: I have recommended that she get compression stockings.   Leia Alf, MD, FACS [facility] [phone] Pager [phone]

## 2018-05-21 ENCOUNTER — Encounter: Payer: Self-pay | Admitting: Surgery

## 2018-05-27 ENCOUNTER — Other Ambulatory Visit: Payer: Self-pay | Admitting: Internal Medicine

## 2018-06-20 ENCOUNTER — Telehealth: Payer: Medicare Other | Admitting: Physician Assistant

## 2018-06-20 DIAGNOSIS — B9789 Other viral agents as the cause of diseases classified elsewhere: Principal | ICD-10-CM

## 2018-06-20 DIAGNOSIS — J069 Acute upper respiratory infection, unspecified: Secondary | ICD-10-CM

## 2018-06-20 DIAGNOSIS — Z7189 Other specified counseling: Secondary | ICD-10-CM

## 2018-06-20 MED ORDER — SALINE SPRAY 0.65 % NA SOLN: 1.0000 | NASAL | 0 refills | Status: DC | PRN

## 2018-06-20 MED ORDER — BENZONATATE 100 MG PO CAPS: 100.0000 mg | ORAL_CAPSULE | Freq: Three times a day (TID) | ORAL | 0 refills | Status: DC | PRN

## 2018-06-20 NOTE — Progress Notes (Signed)
E-Visit for Corona Virus Screening  Based on your current symptoms, you may very well have the virus, however your symptoms are mild. Currently, not all patients are being tested. If the symptoms are mild and there is not a known exposure, performing the test is not indicated.  Coronavirus disease 2019 (COVID-19) is a respiratory illness that can spread from person to person. The virus that causes COVID-19 is a new virus that was first identified in the country of [place] but is now found in multiple other countries and has spread to the [place].  Symptoms associated with the virus are mild to severe fever, cough, and shortness of breath. There is currently no vaccine to protect against COVID-19, and there is no specific antiviral treatment for the virus.   To be considered HIGH RISK for Coronavirus (COVID-19), you have to meet the following criteria:  . Traveled to [place], [place], [place], [place] or [place]; or in the [place] to [place], [place], [place], or [place]; and have fever, cough, and shortness of breath within the last 2 weeks of travel OR  . Been in close contact with a person diagnosed with COVID-19 within the last 2 weeks and have fever, cough, and shortness of breath  . IF YOU DO NOT MEET THESE CRITERIA, YOU ARE CONSIDERED LOW RISK FOR COVID-19.   It is vitally important that if you feel that you have an infection such as this virus or any other virus that you stay home and away from places where you may spread it to others.  You should self-quarantine for 14 days if you have symptoms that could potentially be coronavirus and avoid contact with people age 73 and older.   You can use medication such as A prescription cough medication called Tessalon Perles 100 mg. You may take 1-2 capsules every 8 hours as needed for cough. You may use nasal saline for the nasal congestion. You may also take acetaminophen (Tylenol) as needed for fever, sore throat, body aches, and  headache. Make sure you are drinking at least 64 oz of water a day.   Due to your symptom set, I had you enrolled you in [facility] [place].  This is an interactive plan of care for potential COVID-19 patients allowing for remote monitoring, education, symptom management, medication reminders, and tracking vital signs. If you are using your smartphone/MyChart app, you will receive a daily reminder for 14 days to complete a symptom check-in questionnaire and enter your temperature reading (if you have access to a thermometer).  If you do not have smartphone access, you will receive a daily email reminder for 14 days to complete a symptom check-in questionnaire and temperature reading. If your symptoms are worsening, or your temperature is over 100.3, you will receive a notice that the care team has been notified, and you are to call 911 if you are in distress. Please let me know if you have any questions regarding this.       Reduce your risk of any infection by using the same precautions used for avoiding the common cold or flu:  [name] Wash your hands often with soap and warm water for at least 20 seconds.  If soap and water are not readily available, use an alcohol-based hand sanitizer with at least 60% alcohol.  . If coughing or sneezing, cover your mouth and nose by coughing or sneezing into the elbow areas of your shirt or coat, into a tissue or into your sleeve (  not your hands). . Avoid shaking hands with others and consider head nods or verbal greetings only. . Avoid touching your eyes, nose, or mouth with unwashed hands.  . Avoid close contact with people who are sick. . Avoid places or events with large numbers of people in one location, like concerts or sporting events. . Carefully consider travel plans you have or are making. . If you are planning any travel outside or inside the [place], visit the CDC's Travelers' Health webpage for the latest health notices. . If you have some symptoms  but not all symptoms, continue to monitor at home and seek medical attention if your symptoms worsen. . If you are having a medical emergency, call 911.  HOME CARE . Only take medications as instructed by your medical team. . Drink plenty of fluids and get plenty of rest. . A steam or ultrasonic humidifier can help if you have congestion.   GET HELP RIGHT AWAY IF: . You develop worsening fever. . You become short of breath . You cough up blood. . Your symptoms become more severe MAKE SURE YOU   Understand these instructions.  Will watch your condition.  Will get help right away if you are not doing well or get worse.  Your e-visit answers were reviewed by a board certified advanced clinical practitioner to complete your personal care plan.  Depending on the condition, your plan could have included both over the counter or prescription medications.  If there is a problem please reply once you have received a response from your provider. Your safety is important to [place].  If you have drug allergies check your prescription carefully.    You can use MyChart to ask questions about today's visit, request a non-urgent call back, or ask for a work or school excuse for 24 hours related to this e-Visit. If it has been greater than 24 hours you will need to follow up with your provider, or enter a new e-Visit to address those concerns. You will get an e-mail in the next two days asking about your experience.  I hope that your e-visit has been valuable and will speed your recovery. Thank you for using e-visits.  I have spent 5 minutes in review of e-visit questionnaire, review and updating patient chart, medical decision making and response to patient.    Tenna Delaine, PA-C

## 2018-07-04 ENCOUNTER — Other Ambulatory Visit: Payer: Self-pay | Admitting: Internal Medicine

## 2018-07-28 ENCOUNTER — Encounter: Payer: Self-pay | Admitting: Internal Medicine

## 2018-08-05 ENCOUNTER — Other Ambulatory Visit: Payer: Self-pay

## 2018-08-05 MED ORDER — METFORMIN HCL 500 MG PO TABS: 500.0000 mg | ORAL_TABLET | Freq: Two times a day (BID) | ORAL | 3 refills | Status: DC

## 2018-08-05 NOTE — Telephone Encounter (Signed)
Copied from [place] [id number]. Topic: Quick Communication - See Telephone Encounter >> Aug 05, 2018 11:24 AM Loma [place] wrote: CRM for notification. See Telephone encounter for: 08/05/18. [facility]ney called on pt, Ebony speaking. States wants to just leave [place] a message as follows: Dr Royce Macadamia is and has recommended to pt to reduce Metformin to 500 mg twice daily. If her function drop below 30% she will need to stop it all together. It would be ideal if an alternate regimen was in place not including the metformin.

## 2018-08-05 NOTE — Telephone Encounter (Signed)
Let patient know her metformin needs to be decreased to 500 mg twice daily due to her kidney function.  If her sugars go up we will need to consider insulin - we will discuss this at her next visit  New rx for metformin pending

## 2018-09-09 NOTE — Progress Notes (Signed)
Subjective:    Patient ID: Kerry Powell, female    DOB: 1945/11/28, 73 y.o.   MRN: [id number]  HPI The patient is here for follow up.  She is not exercising regularly.     Scratchy/scaly/ rough throat, when she swallows she chokes, sometimes wakes up choking, h/o esophageal dilation:  She takes the protonix daily. She denies GERD.  She has had an esophageal dilation in the past-2017 and had similar symptoms at that time.  She does not want to see GI at this time.  She wonders if her neck issues is contributing.  Hypertension: She is taking her medication daily. She is compliant with a low sodium diet.  She denies chest pain, and regular headaches.      Hyperlipidemia: She is taking her medication daily. She is compliant with a low fat/cholesterol diet. She denies myalgias.   Diabetes: She is taking her medication daily as prescribed, but just started the glimepiride yesterday. She is compliant with a diabetic diet.  She monitors her sugars and they have been running 126.  She is up-to-date with an ophthalmology examination.   Depression, irritability, Anxiety: She is taking her medication daily as prescribed. She denies any side effects from the medication. She feels her anxiety is well controlled and she is happy with her current dose of medication.   GERD:  She is taking her medication daily as prescribed.  She denies any GERD symptoms and feels her GERD is well controlled.   Chronic neck pain, neuralgia:  She follows with orthopedics.  She has gotten injections.    Medications and allergies reviewed with patient and updated if appropriate.  Patient Active Problem List   Diagnosis Date Noted  . SOB (shortness of breath) 04/17/2018  . Vitamin D deficiency 03/12/2018  . Thoracic aortic aneurysm ([place]) 02/06/2018  . Sleep difficulties 09/09/2017  . Nausea and vomiting 09/09/2017  . Depression 07/01/2017  . Dizziness 06/10/2017  . H/O renal cell carcinoma 05/23/2017  .  Hyperlipidemia 06/25/2016  . Occipital neuralgia 05/09/2016  . Anxiety 07/26/2015  . Chronic neck pain 04/30/2015  . Diabetes ([place]) 04/27/2015  . History of CVA (cerebrovascular accident) 04/26/2015  . History of gout 04/26/2015  . CKD (chronic kidney disease) 04/26/2015  . Essential hypertension, benign 04/26/2015  . Snoring 04/26/2015  . Localized edema 04/26/2015  . Dysphagia 04/26/2015  . GERD (gastroesophageal reflux disease) 04/26/2015  . Personal history of colonic adenoma 05/29/2007  . HIATAL HERNIA 04/07/2007  . RESTLESS LEG SYNDROME 04/04/2007  . 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 COPD 04/04/2007  . IRRITABLE BOWEL SYNDROME 04/04/2007  . OSTEOARTHRITIS 04/04/2007    Current Outpatient Medications on File Prior to Visit  Medication Sig Dispense Refill  . ACETAMINOPHEN-BUTALBITAL 50-325 MG TABS Take 1 tablet by mouth daily as needed. 90 each 0  . allopurinol (ZYLOPRIM) 300 MG tablet TAKE 1 TABLET BY MOUTH EVERY DAY 90 tablet 1  . amLODipine (NORVASC) 5 MG tablet TAKE 1.5 TABLETS (7.5 MG TOTAL) BY MOUTH DAILY. 135 tablet 1  . benzonatate (TESSALON) 100 MG capsule Take 1-2 capsules (100-200 mg total) by mouth 3 (three) times daily as needed for cough. 40 capsule 0  . clopidogrel (PLAVIX) 75 MG tablet TAKE 1 TABLET BY MOUTH EVERY DAY 90 tablet 1  . DULoxetine (CYMBALTA) 60 MG capsule TAKE 1 CAPSULE BY MOUTH EVERY DAY 90 capsule 0  . furosemide (LASIX) 20 MG tablet TAKE 1 TABLET (20 MG TOTAL) BY MOUTH EVERY 3 (THREE) DAYS. [place]  tablet 2  . glimepiride (AMARYL) 2 MG tablet Take 1 tablet (2 mg total) by mouth daily before breakfast. 30 tablet 5  . ketoconazole (NIZORAL) 2 % cream APPLY TO AFFECTED AREA UP TO TWICE A DAY AS NEEDED    . metFORMIN (GLUCOPHAGE) 500 MG tablet Take 1 tablet (500 mg total) by mouth 2 (two) times daily with a meal. 180 tablet 3  . metoprolol succinate (TOPROL-XL) 25 MG 24 hr tablet TAKE 1 TABLET BY MOUTH EVERY DAY 90 tablet 1  . pantoprazole (PROTONIX)  40 MG tablet TAKE 1 TABLET BY MOUTH EVERY DAY 90 tablet 1  . rosuvastatin (CRESTOR) 10 MG tablet TAKE 1 TABLET (10 MG TOTAL) BY MOUTH DAILY. 90 tablet 3  . sodium chloride (OCEAN) 0.65 % SOLN nasal spray Place 1 spray into both nostrils as needed for congestion. 60 mL 0  . telmisartan (MICARDIS) 80 MG tablet Take 1 tablet (80 mg total) by mouth daily. 90 tablet 1   No current facility-administered medications on file prior to visit.     Past Medical History:  Diagnosis Date  . Anxiety   . Arthritis   . BACK PAIN, CHRONIC 04/04/2007   Qualifier: Diagnosis of  By: Carlean Purl MD, Dimas Millin BIPOLAR AFFECTIVE DISORDER 04/04/2007   Qualifier: History of  By: Carlean Purl MD, Dimas Millin Cataract    removed both eyes  . Chronic neck pain 04/30/2015   Cervical spondylosis [facility] Intermittent numbness and tingling in occipital region and headaches S/p 2 occipital nerve blocks - only temp relief no relief with gabapentin, ultram, brace MRI neck - facet arthrosis   . CKD (chronic kidney disease) 04/26/2015  . COPD (chronic obstructive pulmonary disease) ([place])    pt denies - told this while was smoking -  . Cough 04/26/2015  . Depression 07/01/2017  . Diabetes ([place]) 04/27/2015   Diagnosed 03/2015   . Dizziness 06/10/2017  . Dysphagia 04/26/2015  . DYSPNEA 12/24/2007   Qualifier: Diagnosis of  By: Glennis Brink    . Elevated TSH 08/09/2015  . GASTRIC ULCER, ACUTE 04/07/2007   Annotation: EGD 04/07/07 Qualifier: Diagnosis of  By: Carlean Purl MD, FACG, [place] 04/07/2007   Annotation: EGD 04/07/07 Qualifier: Diagnosis of  By: Carlean Purl MD, Dimas Millin   . GERD (gastroesophageal reflux disease)   . H/O renal cell carcinoma 05/23/2017   Dx 2010, s/p cryoablation Follows with urology - Dr Lovena Neighbours  . HIATAL HERNIA 04/07/2007   Annotation: EGD 04/07/07 Qualifier: Diagnosis of  By: Carlean Purl MD, Dimas Millin History of CVA (cerebrovascular accident) 04/26/2015  . History of gout 04/26/2015   . Hyperlipidemia   . Hypertension   . Irritable bowel syndrome 04/04/2007   Qualifier: Diagnosis of  By: Carlean Purl MD, Dimas Millin Kidney tumor (benign)   . Nausea and vomiting 09/09/2017  . 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 OSTEOARTHRITIS 04/04/2007   Qualifier: Diagnosis of  By: Carlean Purl MD, Dimas Millin Personal history of colonic adenoma 06/07/2012  . POSTNASAL DRIP SYNDROME 12/29/2007   Qualifier: Diagnosis of  By: Chase Caller MD, [name]    . RESTLESS LEG SYNDROME 04/04/2007   Qualifier: Diagnosis of  By: Carlean Purl MD, Cindra Presume, 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   Qualifier: Diagnosis of  By: Carlean Purl MD, Dimas Millin  Sleep difficulties 09/09/2017  . Snoring 04/26/2015  . Stroke [facility])    x2 2011  . Thoracic aortic aneurysm ([place]) 02/06/2018   Seen on MRI 01/25/18 of thoracic spine ordered by [facility] -- referred to CTS to monitor  -- 3.5 cm  . TIA (transient ischemic attack)   . TOBACCO ABUSE 12/17/2007   Qualifier: Diagnosis of  By: Chase Caller MD, [name]    . Trigeminal neuralgia of right side of face 02/08/2016  . URI (upper respiratory infection) 02/08/2016  . Vitamin D deficiency 03/12/2018    Past Surgical History:  Procedure Laterality Date  . ABDOMINAL HYSTERECTOMY    . CATARACT EXTRACTION    . COLONOSCOPY    . CYSTOCELE REPAIR    . ESOPHAGOGASTRODUODENOSCOPY    . KIDNEY SURGERY     benign tumor removal  . ORTHOPEDIC SURGERY     Wrist & elbow  . POLYPECTOMY    . RECTOCELE REPAIR    . UPPER GASTROINTESTINAL ENDOSCOPY      Social History   Socioeconomic History  . Marital status: Married    Spouse name: Not on file  . Number of children: 2  . Years of education: Not on file  . Highest education level: Not on file  Occupational History  . Occupation: Retired  Scientific laboratory technician  . Financial resource strain: Not on file  . Food insecurity    Worry: Not on file    Inability: Not on file  .  Transportation needs    Medical: Not on file    Non-medical: Not on file  Tobacco Use  . Smoking status: Former Smoker    Quit date: 06/30/2009    Years since quitting: 9.2  . Smokeless tobacco: Never Used  Substance and Sexual Activity  . Alcohol use: No  . Drug use: No  . Sexual activity: Not on file  Lifestyle  . Physical activity    Days per week: Not on file    Minutes per session: Not on file  . Stress: Not on file  Relationships  . Social Herbalist on phone: Not on file    Gets together: Not on file    Attends religious service: Not on file    Active member of club or organization: Not on file    Attends meetings of clubs or organizations: Not on file    Relationship status: Not on file  Other Topics Concern  . Not on file  Social History Narrative   Married, retired   1 son 1 daughter   2 caffeine/day    Family History  Problem Relation Age of Onset  . Congestive Heart Failure Mother   . Cancer Mother        ovarian  . Heart disease Father   . Diabetes Father   . Kidney disease Father   . Diabetes Sister   . Kidney disease Sister   . Colon cancer Neg Hx   . Colon polyps Neg Hx   . Esophageal cancer Neg Hx   . Rectal cancer Neg Hx   . Stomach cancer Neg Hx     Review of Systems  Constitutional: Negative for chills and fever.  HENT: Negative for sore throat (scratchy throat) and trouble swallowing.   Respiratory: Positive for choking and shortness of breath (with exertion). Negative for cough and wheezing.   Cardiovascular: Positive for palpitations (with activity) and leg swelling (occ). Negative for chest pain.  Gastrointestinal: Negative for abdominal pain.  Neurological: Negative  for light-headedness and headaches.       Objective:   Vitals:   09/10/18 1325  BP: 132/60  Pulse: 95  Resp: 18  Temp: (!) 97.5 F (36.4 C)  SpO2: 90%   BP Readings from Last 3 Encounters:  09/10/18 132/60  05/19/18 128/84  04/17/18 (!) 142/80    Wt Readings from Last 3 Encounters:  09/10/18 232 lb (105.2 kg)  05/19/18 232 lb (105.2 kg)  04/24/18 230 lb (104.3 kg)   Body mass index is 38.61 kg/m.   Physical Exam    Constitutional: Appears well-developed and well-nourished. No distress.  HENT:  Head: Normocephalic and atraumatic.  Neck: Neck supple. No tracheal deviation present. No thyromegaly present.  No cervical lymphadenopathy Cardiovascular: Normal rate, regular rhythm and normal heart sounds.   No murmur heard. No carotid bruit .  No edema Pulmonary/Chest: Effort normal and breath sounds normal. No respiratory distress. No has no wheezes. No rales.  Skin: Skin is warm and dry. Not diaphoretic.  Psychiatric: Normal mood and affect. Behavior is normal.      Assessment & Plan:    See Problem List for Assessment and Plan of chronic medical problems.

## 2018-09-10 ENCOUNTER — Other Ambulatory Visit (INDEPENDENT_AMBULATORY_CARE_PROVIDER_SITE_OTHER): Payer: Medicare Other

## 2018-09-10 ENCOUNTER — Ambulatory Visit (INDEPENDENT_AMBULATORY_CARE_PROVIDER_SITE_OTHER): Payer: Medicare Other | Admitting: Internal Medicine

## 2018-09-10 ENCOUNTER — Encounter: Payer: Self-pay | Admitting: Internal Medicine

## 2018-09-10 ENCOUNTER — Other Ambulatory Visit: Payer: Self-pay

## 2018-09-10 VITALS — BP 132/60 | HR 95 | Temp 97.5°F | Resp 18 | Ht 65.0 in | Wt 232.0 lb

## 2018-09-10 DIAGNOSIS — K219 Gastro-esophageal reflux disease without esophagitis: Secondary | ICD-10-CM

## 2018-09-10 DIAGNOSIS — E119 Type 2 diabetes mellitus without complications: Secondary | ICD-10-CM | POA: Diagnosis not present

## 2018-09-10 DIAGNOSIS — R1310 Dysphagia, unspecified: Secondary | ICD-10-CM

## 2018-09-10 DIAGNOSIS — I10 Essential (primary) hypertension: Secondary | ICD-10-CM

## 2018-09-10 DIAGNOSIS — F3289 Other specified depressive episodes: Secondary | ICD-10-CM

## 2018-09-10 DIAGNOSIS — E7849 Other hyperlipidemia: Secondary | ICD-10-CM

## 2018-09-10 DIAGNOSIS — F419 Anxiety disorder, unspecified: Secondary | ICD-10-CM

## 2018-09-10 LAB — CBC WITH DIFFERENTIAL/PLATELET
Basophils Absolute: 0.1 10*3/uL (ref 0.0–0.1)
Basophils Relative: 0.7 % (ref 0.0–3.0)
Eosinophils Absolute: 0.4 10*3/uL (ref 0.0–0.7)
Eosinophils Relative: 3.4 % (ref 0.0–5.0)
HCT: 37.4 % (ref 36.0–46.0)
Hemoglobin: 11.6 g/dL — ABNORMAL LOW (ref 12.0–15.0)
Lymphocytes Relative: 21.9 % (ref 12.0–46.0)
Lymphs Abs: 2.2 10*3/uL (ref 0.7–4.0)
MCHC: 31.1 g/dL (ref 30.0–36.0)
MCV: 81.8 fl (ref 78.0–100.0)
Monocytes Absolute: 0.8 10*3/uL (ref 0.1–1.0)
Monocytes Relative: 7.3 % (ref 3.0–12.0)
Neutro Abs: 6.9 10*3/uL (ref 1.4–7.7)
Neutrophils Relative %: 66.7 % (ref 43.0–77.0)
Platelets: 361 10*3/uL (ref 150.0–400.0)
RBC: 4.57 Mil/uL (ref 3.87–5.11)
RDW: 16.9 % — ABNORMAL HIGH (ref 11.5–15.5)
WBC: 10.3 10*3/uL (ref 4.0–10.5)

## 2018-09-10 LAB — COMPREHENSIVE METABOLIC PANEL
ALT: 13 U/L (ref 0–35)
AST: 13 U/L (ref 0–37)
Albumin: 4.5 g/dL (ref 3.5–5.2)
Alkaline Phosphatase: 115 U/L (ref 39–117)
BUN: 25 mg/dL — ABNORMAL HIGH (ref 6–23)
CO2: 28 mEq/L (ref 19–32)
Calcium: 9.1 mg/dL (ref 8.4–10.5)
Chloride: 101 mEq/L (ref 96–112)
Creatinine, Ser: 1.51 mg/dL — ABNORMAL HIGH (ref 0.40–1.20)
GFR: 33.73 mL/min — ABNORMAL LOW (ref >60.00)
Glucose, Bld: 124 mg/dL — ABNORMAL HIGH (ref 70–99)
Potassium: 4.6 mEq/L (ref 3.5–5.1)
Sodium: 136 mEq/L (ref 135–145)
Total Bilirubin: 0.4 mg/dL (ref 0.2–1.2)
Total Protein: 7.5 g/dL (ref 6.0–8.3)

## 2018-09-10 LAB — HEMOGLOBIN A1C: Hgb A1c MFr Bld: 7.5 % — ABNORMAL HIGH (ref 4.6–6.5)

## 2018-09-10 NOTE — Assessment & Plan Note (Signed)
BP well controlled Current regimen effective and well tolerated Continue current medications at current doses cmp  

## 2018-09-10 NOTE — Patient Instructions (Addendum)
  Tests ordered today. Your results will be released to MyChart (or called to you) after review.  If any changes need to be made, you will be notified at that same time.    Medications reviewed and updated.  Changes include :   none     Please followup in 6 months   

## 2018-09-10 NOTE — Assessment & Plan Note (Signed)
GERD controlled Continue daily medication  

## 2018-09-10 NOTE — Assessment & Plan Note (Signed)
Check lipid panel  Continue daily statin Regular exercise and healthy diet encouraged  

## 2018-09-10 NOTE — Assessment & Plan Note (Signed)
Controlled, stable Continue current dose of medication  

## 2018-09-10 NOTE — Assessment & Plan Note (Signed)
Has increased anxiety/stress try to take care of her husband and son who both rely heavily on her Overall fairly controlled Continue Cymbalta at current dose

## 2018-09-10 NOTE — Assessment & Plan Note (Signed)
She is having symptoms of dysphasia-scratchy throat, rough throat, choking when swallowing History of esophageal dilation in 2017 No GERD-taking Protonix daily Discussed that she may need to have this dilated again-does not want to see GI at this time and we will just monitor Her cervical spine arthritis could be contributing, but discussed that if this does get worse she needs to see GI

## 2018-09-10 NOTE — Assessment & Plan Note (Signed)
Check A1c She is taking metformin as prescribed She did start glimepiride but not until yesterday so her A1c will not reflect this She is doing fairly well with her diet No regular exercise Ideally should work on weight loss

## 2018-09-11 ENCOUNTER — Other Ambulatory Visit: Payer: Self-pay | Admitting: Internal Medicine

## 2018-09-29 ENCOUNTER — Other Ambulatory Visit: Payer: Self-pay | Admitting: Internal Medicine

## 2018-10-10 ENCOUNTER — Ambulatory Visit: Payer: Self-pay | Admitting: *Deleted

## 2018-10-10 NOTE — Telephone Encounter (Signed)
   Reason for Disposition . Extra heart beats OR irregular heart beating   (i.e., "palpitations")    Reported 3 day history of shortness of breath with coughing episodes and at rest, heart racing, and weakness  Answer Assessment - Initial Assessment Questions 1. RESPIRATORY STATUS: "Describe your breathing?" (e.g., wheezing, shortness of breath, unable to speak, severe coughing)      Short of breath with coughing episodes, and intermittently at rest  2. ONSET: "When did this breathing problem begin?"      3 days ago  3. PATTERN "Does the difficult breathing come and go, or has it been constant since it started?"  With coughing episodes and intermittently at rest       4. SEVERITY: "How bad is your breathing?" (e.g., mild, moderate, severe)    - MILD: No SOB at rest, mild SOB with walking, speaks normally in sentences, can lay down, no retractions, pulse < 100.    - MODERATE: SOB at rest, SOB with minimal exertion and prefers to sit, cannot lie down flat, speaks in phrases, mild retractions, audible wheezing, pulse 100-120.    - SEVERE: Very SOB at rest, speaks in single words, struggling to breathe, sitting hunched forward, retractions, pulse > 120      Severe and pulse rates between 119-150   5. RECURRENT SYMPTOM: "Have you had difficulty breathing before?" If so, ask: "When was the last time?" and "What happened that time?"       6. 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 LUNG HISTORY: "Do you have any history of lung disease?"  (e.g., pulmonary embolus, asthma, emphysema)     Denies history of lung disease, quit smoking in 2011 8. CAUSE: "What do you think is causing the breathing problem?"      Possibly URI  9. OTHER SYMPTOMS: "Do you have any other symptoms? (e.g., dizziness, runny nose, cough, chest pain, fever)     Sneezing, head  congestion, cough  10. PREGNANCY: "Is there any chance you are pregnant?" "When was your last menstrual period?"       n/a 11. TRAVEL: "Have you traveled out of the country in the last month?" (e.g., travel history, exposures)       Denies travel  Protocols used: BREATHING DIFFICULTY-A-AH  Patient describes a 3 day history of coughing causing severe shortness of breath and intermittent shortness of breath at rest.  She states her pulse has been running from 119-150.  Patient checked blood pressure while we were on the phone - BP 165/86 pulse 103.  Patient also mentioned several times that she feels a chest fullness that is not usual for her.  Advised patient she should have someone take her to ER, and she should wear a mask.

## 2018-11-06 ENCOUNTER — Telehealth: Payer: Self-pay | Admitting: Internal Medicine

## 2018-11-06 DIAGNOSIS — E119 Type 2 diabetes mellitus without complications: Secondary | ICD-10-CM

## 2018-11-06 DIAGNOSIS — E559 Vitamin D deficiency, unspecified: Secondary | ICD-10-CM

## 2018-11-06 DIAGNOSIS — I10 Essential (primary) hypertension: Secondary | ICD-10-CM

## 2018-11-06 NOTE — Telephone Encounter (Signed)
Not sure if you feel like she needs more labs. If not I can let her know an A1c can be done in office

## 2018-11-06 NOTE — Telephone Encounter (Signed)
Labs ordered.

## 2018-11-06 NOTE — Telephone Encounter (Signed)
Patient is calling because she has scheduled an appt on 11/12/2018 with Dr. Quay Burow. She is requesting an order to be placed to have her A1C checked prior to her 11/12/2018 appt. Please advise [phone]

## 2018-11-07 ENCOUNTER — Encounter: Payer: Self-pay | Admitting: Internal Medicine

## 2018-11-07 DIAGNOSIS — R52 Pain, unspecified: Secondary | ICD-10-CM

## 2018-11-07 NOTE — Telephone Encounter (Signed)
Pt aware.

## 2018-11-08 NOTE — Telephone Encounter (Signed)
Sounds like she needs a covid test for active disease and [facility] -- she needs to get this done and get the results before she can come to her appt and needs to self quarantine.  Lab ordered

## 2018-11-11 ENCOUNTER — Other Ambulatory Visit (INDEPENDENT_AMBULATORY_CARE_PROVIDER_SITE_OTHER): Payer: Medicare Other

## 2018-11-11 ENCOUNTER — Other Ambulatory Visit: Payer: Self-pay | Admitting: Registered"

## 2018-11-11 DIAGNOSIS — I10 Essential (primary) hypertension: Secondary | ICD-10-CM | POA: Diagnosis not present

## 2018-11-11 DIAGNOSIS — E559 Vitamin D deficiency, unspecified: Secondary | ICD-10-CM

## 2018-11-11 DIAGNOSIS — E119 Type 2 diabetes mellitus without complications: Secondary | ICD-10-CM

## 2018-11-11 DIAGNOSIS — Z20822 Contact with and (suspected) exposure to covid-19: Secondary | ICD-10-CM

## 2018-11-11 LAB — BASIC METABOLIC PANEL
BUN: 21 mg/dL (ref 6–23)
CO2: 27 mEq/L (ref 19–32)
Calcium: 9.5 mg/dL (ref 8.4–10.5)
Chloride: 101 mEq/L (ref 96–112)
Creatinine, Ser: 1.61 mg/dL — ABNORMAL HIGH (ref 0.40–1.20)
GFR: 31.31 mL/min — ABNORMAL LOW (ref >60.00)
Glucose, Bld: 261 mg/dL — ABNORMAL HIGH (ref 70–99)
Potassium: 4.6 mEq/L (ref 3.5–5.1)
Sodium: 136 mEq/L (ref 135–145)

## 2018-11-11 LAB — HEMOGLOBIN A1C: Hgb A1c MFr Bld: 7.4 % — ABNORMAL HIGH (ref 4.6–6.5)

## 2018-11-11 LAB — VITAMIN D 25 HYDROXY (VIT D DEFICIENCY, FRACTURES): VITD: 28.41 ng/mL — ABNORMAL LOW (ref 30.00–100.00)

## 2018-11-12 ENCOUNTER — Ambulatory Visit: Payer: Medicare Other | Admitting: Internal Medicine

## 2018-11-13 LAB — NOVEL CORONAVIRUS, NAA: SARS-CoV-2, NAA: NOT DETECTED

## 2018-11-13 NOTE — Progress Notes (Signed)
Subjective:    Patient ID: Kerry Powell, female    DOB: [birth year], 73 y.o.   MRN: [id number]  HPI The patient is here for follow up.   Diabetes: She is taking her medication daily as prescribed. She is compliant with a diabetic diet.  She is very tired and does not feel like doing anything.  Her mid back in the center she has a grabbing feeling.  She does follow with a back specialist and the concern is that this is related to arthritis and muscle spasms as a result.  She has the pain only with activity, lifting, pulling.  It takes her breath away.  Massage to the area helps after 4-5 minutes.  She has not tried heat. She feels it daily, multiple times a day.  It is significantly affecting her quality of life and lifestyle.  She is having difficulty doing anything because of the pain.  The back specialist can do a nerve ablation, but they are not sure if that would help this pain or not.  She does not want to have this procedure done.  She does have a TENS unit, but her daughter Kerry Powell and she has not tried that yet.  She has been meaning to try that.   Medications and allergies reviewed with patient and updated if appropriate.  Patient Active Problem List   Diagnosis Date Noted  . SOB (shortness of breath) 04/17/2018  . Vitamin D deficiency 03/12/2018  . Thoracic aortic aneurysm ([place]) 02/06/2018  . Sleep difficulties 09/09/2017  . Nausea and vomiting 09/09/2017  . Depression 07/01/2017  . Dizziness 06/10/2017  . H/O renal cell carcinoma 05/23/2017  . Hyperlipidemia 06/25/2016  . Occipital neuralgia 05/09/2016  . Anxiety 07/26/2015  . Chronic neck pain 04/30/2015  . Diabetes ([place]) 04/27/2015  . History of CVA (cerebrovascular accident) 04/26/2015  . History of gout 04/26/2015  . CKD (chronic kidney disease) 04/26/2015  . Essential hypertension, benign 04/26/2015  . Snoring 04/26/2015  . Localized edema 04/26/2015  . Dysphagia 04/26/2015  . GERD (gastroesophageal  reflux disease) 04/26/2015  . Personal history of colonic adenoma 05/29/2007  . HIATAL HERNIA 04/07/2007  . RESTLESS LEG SYNDROME 04/04/2007  . 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 COPD 04/04/2007  . IRRITABLE BOWEL SYNDROME 04/04/2007  . OSTEOARTHRITIS 04/04/2007    Current Outpatient Medications on File Prior to Visit  Medication Sig Dispense Refill  . ACETAMINOPHEN-BUTALBITAL 50-325 MG TABS Take 1 tablet by mouth daily as needed. 90 each 0  . allopurinol (ZYLOPRIM) 300 MG tablet TAKE 1 TABLET BY MOUTH EVERY DAY 90 tablet 1  . amLODipine (NORVASC) 5 MG tablet TAKE 1.5 TABLETS (7.5 MG TOTAL) BY MOUTH DAILY. 135 tablet 1  . clopidogrel (PLAVIX) 75 MG tablet TAKE 1 TABLET BY MOUTH EVERY DAY 90 tablet 1  . DULoxetine (CYMBALTA) 60 MG capsule TAKE 1 CAPSULE BY MOUTH EVERY DAY 90 capsule 0  . glimepiride (AMARYL) 2 MG tablet Take 1 tablet (2 mg total) by mouth daily before breakfast. 30 tablet 5  . ketoconazole (NIZORAL) 2 % cream APPLY TO AFFECTED AREA UP TO TWICE A DAY AS NEEDED    . metFORMIN (GLUCOPHAGE) 500 MG tablet Take 1 tablet (500 mg total) by mouth 2 (two) times daily with a meal. 180 tablet 3  . metoprolol succinate (TOPROL-XL) 25 MG 24 hr tablet TAKE 1 TABLET BY MOUTH EVERY DAY 90 tablet 1  . pantoprazole (PROTONIX) 40 MG tablet TAKE 1 TABLET BY MOUTH EVERY DAY 90 tablet  1  . rosuvastatin (CRESTOR) 10 MG tablet TAKE 1 TABLET BY MOUTH EVERY DAY 90 tablet 1  . sodium chloride (OCEAN) 0.65 % SOLN nasal spray Place 1 spray into both nostrils as needed for congestion. 60 mL 0  . telmisartan (MICARDIS) 80 MG tablet TAKE 1 TABLET BY MOUTH EVERY DAY 90 tablet 1   No current facility-administered medications on file prior to visit.     Past Medical History:  Diagnosis Date  . Anxiety   . Arthritis   . BACK PAIN, CHRONIC 04/04/2007   Qualifier: Diagnosis of  By: Carlean Purl MD, Dimas Millin BIPOLAR AFFECTIVE DISORDER 04/04/2007   Qualifier: History of  By: Carlean Purl MD, Dimas Millin Cataract    removed both eyes  . Chronic neck pain 04/30/2015   Cervical spondylosis [facility] Intermittent numbness and tingling in occipital region and headaches S/p 2 occipital nerve blocks - only temp relief no relief with gabapentin, ultram, brace MRI neck - facet arthrosis   . CKD (chronic kidney disease) 04/26/2015  . COPD (chronic obstructive pulmonary disease) ([place])    pt denies - told this while was smoking -  . Cough 04/26/2015  . Depression 07/01/2017  . Diabetes ([place]) 04/27/2015   Diagnosed 03/2015   . Dizziness 06/10/2017  . Dysphagia 04/26/2015  . DYSPNEA 12/24/2007   Qualifier: Diagnosis of  By: Glennis Brink    . Elevated TSH 08/09/2015  . GASTRIC ULCER, ACUTE 04/07/2007   Annotation: EGD 04/07/07 Qualifier: Diagnosis of  By: Carlean Purl MD, FACG, [place] 04/07/2007   Annotation: EGD 04/07/07 Qualifier: Diagnosis of  By: Carlean Purl MD, Dimas Millin   . GERD (gastroesophageal reflux disease)   . H/O renal cell carcinoma 05/23/2017   Dx 2010, s/p cryoablation Follows with urology - Dr Lovena Neighbours  . HIATAL HERNIA 04/07/2007   Annotation: EGD 04/07/07 Qualifier: Diagnosis of  By: Carlean Purl MD, Dimas Millin History of CVA (cerebrovascular accident) 04/26/2015  . History of gout 04/26/2015  . Hyperlipidemia   . Hypertension   . Irritable bowel syndrome 04/04/2007   Qualifier: Diagnosis of  By: Carlean Purl MD, Dimas Millin Kidney tumor (benign)   . Nausea and vomiting 09/09/2017  . 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 OSTEOARTHRITIS 04/04/2007   Qualifier: Diagnosis of  By: Carlean Purl MD, Dimas Millin Personal history of colonic adenoma 06/07/2012  . POSTNASAL DRIP SYNDROME 12/29/2007   Qualifier: Diagnosis of  By: Chase Caller MD, [name]    . RESTLESS LEG SYNDROME 04/04/2007   Qualifier: Diagnosis of  By: Carlean Purl MD, FACG, [place], 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   Qualifier: Diagnosis of  By: Carlean Purl MD, Dimas Millin Sleep difficulties 09/09/2017  . Snoring 04/26/2015  . Stroke [facility])    x2 2011  . Thoracic aortic aneurysm ([place]) 02/06/2018   Seen on MRI 01/25/18 of thoracic spine ordered by [facility] -- referred to CTS to monitor  -- 3.5 cm  . TIA (transient ischemic attack)   . TOBACCO ABUSE 12/17/2007   Qualifier: Diagnosis of  By: Chase Caller MD, [name]    . Trigeminal neuralgia of right side of face 02/08/2016  . URI (upper respiratory infection) 02/08/2016  . Vitamin D deficiency 03/12/2018    Past Surgical History:  Procedure Laterality Date  . ABDOMINAL HYSTERECTOMY    .  CATARACT EXTRACTION    . COLONOSCOPY    . CYSTOCELE REPAIR    . ESOPHAGOGASTRODUODENOSCOPY    . KIDNEY SURGERY     benign tumor removal  . ORTHOPEDIC SURGERY     Wrist & elbow  . POLYPECTOMY    . RECTOCELE REPAIR    . UPPER GASTROINTESTINAL ENDOSCOPY      Social History   Socioeconomic History  . Marital status: Married    Spouse name: Not on file  . Number of children: 2  . Years of education: Not on file  . Highest education level: Not on file  Occupational History  . Occupation: Retired  Scientific laboratory technician  . Financial resource strain: Not on file  . Food insecurity    Worry: Not on file    Inability: Not on file  . Transportation needs    Medical: Not on file    Non-medical: Not on file  Tobacco Use  . Smoking status: Former Smoker    Quit date: 06/30/2009    Years since quitting: 9.3  . Smokeless tobacco: Never Used  Substance and Sexual Activity  . Alcohol use: No  . Drug use: No  . Sexual activity: Not on file  Lifestyle  . Physical activity    Days per week: Not on file    Minutes per session: Not on file  . Stress: Not on file  Relationships  . Social Herbalist on phone: Not on file    Gets together: Not on file    Attends religious service: Not on file    Active member of club or organization: Not on file    Attends meetings of clubs or organizations: Not on file     Relationship status: Not on file  Other Topics Concern  . Not on file  Social History Narrative   Married, retired   1 son 1 daughter   2 caffeine/day    Family History  Problem Relation Age of Onset  . Congestive Heart Failure Mother   . Cancer Mother        ovarian  . Heart disease Father   . Diabetes Father   . Kidney disease Father   . Diabetes Sister   . Kidney disease Sister   . Colon cancer Neg Hx   . Colon polyps Neg Hx   . Esophageal cancer Neg Hx   . Rectal cancer Neg Hx   . Stomach cancer Neg Hx     Review of Systems  Constitutional: Positive for fatigue. Negative for fever.  Musculoskeletal: Positive for arthralgias, back pain and myalgias.  Neurological: Positive for numbness and headaches.  Psychiatric/Behavioral: Positive for sleep disturbance.       Objective:   Vitals:   11/14/18 1502  BP: 118/60  Pulse: 83  Temp: 97.9 F (36.6 C)  SpO2: 96%   BP Readings from Last 3 Encounters:  11/14/18 118/60  09/10/18 132/60  05/19/18 128/84   Wt Readings from Last 3 Encounters:  11/14/18 232 lb (105.2 kg)  09/10/18 232 lb (105.2 kg)  05/19/18 232 lb (105.2 kg)   Body mass index is 38.61 kg/m.   Physical Exam Constitutional:      Appearance: Normal appearance.  Musculoskeletal:     Comments: No tenderness along thoracic spine with palpation, no tenderness in parathoracic muscles-no active spasm  Skin:    General: Skin is warm and dry.  Neurological:     Mental Status: She is alert.     Sensory:  No sensory deficit.     Motor: No weakness.           Assessment & Plan:    See Problem List for Assessment and Plan of chronic medical problems.

## 2018-11-14 ENCOUNTER — Encounter: Payer: Self-pay | Admitting: Internal Medicine

## 2018-11-14 ENCOUNTER — Other Ambulatory Visit: Payer: Self-pay

## 2018-11-14 ENCOUNTER — Ambulatory Visit (INDEPENDENT_AMBULATORY_CARE_PROVIDER_SITE_OTHER): Payer: Medicare Other | Admitting: Internal Medicine

## 2018-11-14 VITALS — BP 118/60 | HR 83 | Temp 97.9°F | Ht 65.0 in | Wt 232.0 lb

## 2018-11-14 DIAGNOSIS — E119 Type 2 diabetes mellitus without complications: Secondary | ICD-10-CM

## 2018-11-14 DIAGNOSIS — M62830 Muscle spasm of back: Secondary | ICD-10-CM | POA: Diagnosis not present

## 2018-11-14 DIAGNOSIS — M62838 Other muscle spasm: Secondary | ICD-10-CM | POA: Insufficient documentation

## 2018-11-14 MED ORDER — CYCLOBENZAPRINE HCL 5 MG PO TABS: 5.0000 mg | ORAL_TABLET | Freq: Three times a day (TID) | ORAL | 1 refills | Status: DC | PRN

## 2018-11-14 NOTE — Assessment & Plan Note (Signed)
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

## 2018-11-14 NOTE — Assessment & Plan Note (Signed)
Lab Results  Component Value Date   HGBA1C 7.4 (H) 11/11/2018   Sugars adequately controlled for her Need to monitor kidney function closely-May need to discontinue oral medications

## 2018-11-14 NOTE — Patient Instructions (Addendum)
  Medications reviewed and updated.  Changes include :   Try flexeril for your back pain - this is a muscle relaxer.  Your prescription(s) have been submitted to your pharmacy. Please take as directed and contact our office if you believe you are having problem(s) with the medication(s).    Update me next week

## 2018-11-27 ENCOUNTER — Other Ambulatory Visit: Payer: Self-pay | Admitting: Internal Medicine

## 2018-12-01 ENCOUNTER — Other Ambulatory Visit: Payer: Self-pay | Admitting: Internal Medicine

## 2018-12-09 ENCOUNTER — Other Ambulatory Visit: Payer: Self-pay | Admitting: Internal Medicine

## 2018-12-26 ENCOUNTER — Other Ambulatory Visit: Payer: Self-pay | Admitting: Internal Medicine

## 2019-01-28 ENCOUNTER — Telehealth: Payer: Self-pay

## 2019-01-28 NOTE — Telephone Encounter (Signed)
Copied from [place] [id number]. Topic: General - Inquiry >> Jan 27, 2019  3:01 PM Alease Frame wrote: Reason for CRM: Patient would like a call back from Tammy Dr Quay Burow nurse . Please advise

## 2019-01-28 NOTE — Progress Notes (Signed)
Virtual Visit via Video Note  I connected with Kerry Powell on 01/29/19 at 11:15 AM EST by a video enabled telemedicine application and verified that I am speaking with the correct person using two identifiers.   I discussed the limitations of evaluation and management by telemedicine and the availability of in person appointments. The patient expressed understanding and agreed to proceed.  Present for the visit:  Myself, Dr Billey Gosling, Greggory Brandy.  The patient is currently at home and I am in the office.    No referring provider.    History of Present Illness: She is here for an acute visit for cold symptoms.   Her symptoms started more than one week.  She is experiencing significant fatigue, productive cough, nasal congestion, some sore throat mostly from drainage, nausea and headaches.  Her voice is hoarse.  She has not had any fevers or chills.  She does not feel short of breath.  She denies body aches.  Her appetite is decreased, but she is drinking plenty of fluids.  She also has been experiencing a dull ache when she goes to urinate.  She denies any true pain with urination.  She did not have anyone over for Thanksgiving.  She feels her risk for Covid is low.  She has tried taking mucinex that did not help, saline spray    Review of Systems  Constitutional: Positive for malaise/fatigue. Negative for chills and fever.       Dec appetite  HENT: Positive for congestion and sore throat. Negative for ear pain and sinus pain.   Respiratory: Positive for cough and sputum production. Negative for shortness of breath and wheezing.   Gastrointestinal: Positive for nausea. Negative for diarrhea.  Musculoskeletal: Negative for myalgias.  Neurological: Positive for headaches.     Social History   Socioeconomic History  . Marital status: Married    Spouse name: Not on file  . Number of children: 2  . Years of education: Not on file  . Highest education level: Not on file   Occupational History  . Occupation: Retired  Scientific laboratory technician  . Financial resource strain: Not on file  . Food insecurity    Worry: Not on file    Inability: Not on file  . Transportation needs    Medical: Not on file    Non-medical: Not on file  Tobacco Use  . Smoking status: Former Smoker    Quit date: 06/30/2009    Years since quitting: 9.5  . Smokeless tobacco: Never Used  Substance and Sexual Activity  . Alcohol use: No  . Drug use: No  . Sexual activity: Not on file  Lifestyle  . Physical activity    Days per week: Not on file    Minutes per session: Not on file  . Stress: Not on file  Relationships  . Social Herbalist on phone: Not on file    Gets together: Not on file    Attends religious service: Not on file    Active member of club or organization: Not on file    Attends meetings of clubs or organizations: Not on file    Relationship status: Not on file  Other Topics Concern  . Not on file  Social History Narrative   Married, retired   1 son 1 daughter   2 caffeine/day     Observations/Objective: Appears well in NAD [place] voice Breathing normal Skin appears warm and dry   Temp 96.7,  Sugar  this morning 127 BP 133/81,  Pulse 82  Assessment and Plan:  See Problem List for Assessment and Plan of chronic medical problems.   Follow Up Instructions:    I discussed the assessment and treatment plan with the patient. The patient was provided an opportunity to ask questions and all were answered. The patient agreed with the plan and demonstrated an understanding of the instructions.   The patient was advised to call back or seek an in-person evaluation if the symptoms worsen or if the condition fails to improve as anticipated.    Binnie Rail, MD

## 2019-01-28 NOTE — Telephone Encounter (Signed)
Pt complained of not feeling well. Very fatigue, cough, light headed. She has been tested for COVID and it came back negative. I put her on your schedule tomorrow for a facetime. FYI

## 2019-01-29 ENCOUNTER — Encounter: Payer: Self-pay | Admitting: Internal Medicine

## 2019-01-29 ENCOUNTER — Ambulatory Visit (INDEPENDENT_AMBULATORY_CARE_PROVIDER_SITE_OTHER): Payer: Medicare Other | Admitting: Internal Medicine

## 2019-01-29 DIAGNOSIS — J069 Acute upper respiratory infection, unspecified: Secondary | ICD-10-CM | POA: Diagnosis not present

## 2019-01-29 MED ORDER — PROMETHAZINE-DM 6.25-15 MG/5ML PO SYRP: 5.0000 mL | ORAL_SOLUTION | Freq: Four times a day (QID) | ORAL | 0 refills | Status: DC | PRN

## 2019-01-29 MED ORDER — HYDROCODONE-HOMATROPINE 5-1.5 MG/5ML PO SYRP: 5.0000 mL | ORAL_SOLUTION | Freq: Three times a day (TID) | ORAL | 0 refills | Status: DC | PRN

## 2019-01-29 MED ORDER — CEFDINIR 300 MG PO CAPS: 300.0000 mg | ORAL_CAPSULE | Freq: Two times a day (BID) | ORAL | 0 refills | Status: DC

## 2019-01-29 NOTE — Assessment & Plan Note (Signed)
Concern for bacterial cause She feels she is low risk for Covid, but discussed that if her symptoms do not improve or worsen she will need to be retested Symptoms sound more bacterial in nature Start Omnicef twice daily x10 days Hycodan cough syrup Over-the-counter cold medications as needed Continue increased rest and fluids Call if symptoms worsen or do not improve

## 2019-02-04 ENCOUNTER — Encounter: Payer: Self-pay | Admitting: Internal Medicine

## 2019-02-16 ENCOUNTER — Other Ambulatory Visit: Payer: Self-pay | Admitting: Internal Medicine

## 2019-02-17 ENCOUNTER — Ambulatory Visit: Payer: Medicare Other | Attending: Internal Medicine

## 2019-02-17 DIAGNOSIS — Z20822 Contact with and (suspected) exposure to covid-19: Secondary | ICD-10-CM

## 2019-02-18 LAB — NOVEL CORONAVIRUS, NAA: SARS-CoV-2, NAA: NOT DETECTED

## 2019-02-26 ENCOUNTER — Encounter: Payer: Self-pay | Admitting: Internal Medicine

## 2019-03-02 ENCOUNTER — Other Ambulatory Visit: Payer: Self-pay | Admitting: Internal Medicine

## 2019-03-06 ENCOUNTER — Other Ambulatory Visit: Payer: Self-pay | Admitting: Internal Medicine

## 2019-03-13 ENCOUNTER — Ambulatory Visit: Payer: Medicare Other | Admitting: Internal Medicine

## 2019-03-26 ENCOUNTER — Ambulatory Visit (HOSPITAL_COMMUNITY)
Admission: EM | Admit: 2019-03-26 | Discharge: 2019-03-26 | Disposition: A | Discharge status: Home / Self Care | Payer: Medicare Other | Attending: Family Medicine | Admitting: Family Medicine

## 2019-03-26 ENCOUNTER — Other Ambulatory Visit: Payer: Self-pay

## 2019-03-26 ENCOUNTER — Ambulatory Visit (INDEPENDENT_AMBULATORY_CARE_PROVIDER_SITE_OTHER): Payer: Medicare Other

## 2019-03-26 ENCOUNTER — Encounter (HOSPITAL_COMMUNITY): Payer: Self-pay

## 2019-03-26 DIAGNOSIS — Z79899 Other long term (current) drug therapy: Secondary | ICD-10-CM | POA: Insufficient documentation

## 2019-03-26 DIAGNOSIS — R0982 Postnasal drip: Secondary | ICD-10-CM | POA: Diagnosis not present

## 2019-03-26 DIAGNOSIS — Z20822 Contact with and (suspected) exposure to covid-19: Secondary | ICD-10-CM | POA: Diagnosis not present

## 2019-03-26 DIAGNOSIS — Z85528 Personal history of other malignant neoplasm of kidney: Secondary | ICD-10-CM | POA: Diagnosis not present

## 2019-03-26 DIAGNOSIS — Z7984 Long term (current) use of oral hypoglycemic drugs: Secondary | ICD-10-CM | POA: Diagnosis not present

## 2019-03-26 DIAGNOSIS — Z8249 Family history of ischemic heart disease and other diseases of the circulatory system: Secondary | ICD-10-CM | POA: Diagnosis not present

## 2019-03-26 DIAGNOSIS — E559 Vitamin D deficiency, unspecified: Secondary | ICD-10-CM | POA: Insufficient documentation

## 2019-03-26 DIAGNOSIS — Z888 Allergy status to other drugs, medicaments and biological substances status: Secondary | ICD-10-CM | POA: Diagnosis not present

## 2019-03-26 DIAGNOSIS — R0602 Shortness of breath: Secondary | ICD-10-CM

## 2019-03-26 DIAGNOSIS — Z7952 Long term (current) use of systemic steroids: Secondary | ICD-10-CM | POA: Diagnosis not present

## 2019-03-26 DIAGNOSIS — E785 Hyperlipidemia, unspecified: Secondary | ICD-10-CM | POA: Insufficient documentation

## 2019-03-26 DIAGNOSIS — Z882 Allergy status to sulfonamides status: Secondary | ICD-10-CM | POA: Diagnosis not present

## 2019-03-26 DIAGNOSIS — Z833 Family history of diabetes mellitus: Secondary | ICD-10-CM | POA: Diagnosis not present

## 2019-03-26 DIAGNOSIS — E1122 Type 2 diabetes mellitus with diabetic chronic kidney disease: Secondary | ICD-10-CM | POA: Insufficient documentation

## 2019-03-26 DIAGNOSIS — Z87891 Personal history of nicotine dependence: Secondary | ICD-10-CM | POA: Insufficient documentation

## 2019-03-26 DIAGNOSIS — R000 Tachycardia, unspecified: Secondary | ICD-10-CM | POA: Diagnosis not present

## 2019-03-26 DIAGNOSIS — I712 Thoracic aortic aneurysm, without rupture: Secondary | ICD-10-CM | POA: Diagnosis not present

## 2019-03-26 DIAGNOSIS — Z881 Allergy status to other antibiotic agents status: Secondary | ICD-10-CM | POA: Diagnosis not present

## 2019-03-26 DIAGNOSIS — K219 Gastro-esophageal reflux disease without esophagitis: Secondary | ICD-10-CM | POA: Insufficient documentation

## 2019-03-26 DIAGNOSIS — N189 Chronic kidney disease, unspecified: Secondary | ICD-10-CM | POA: Diagnosis not present

## 2019-03-26 DIAGNOSIS — R600 Localized edema: Secondary | ICD-10-CM

## 2019-03-26 DIAGNOSIS — I700 Atherosclerosis of aorta: Secondary | ICD-10-CM | POA: Insufficient documentation

## 2019-03-26 DIAGNOSIS — Z8673 Personal history of transient ischemic attack (TIA), and cerebral infarction without residual deficits: Secondary | ICD-10-CM | POA: Insufficient documentation

## 2019-03-26 DIAGNOSIS — I129 Hypertensive chronic kidney disease with stage 1 through stage 4 chronic kidney disease, or unspecified chronic kidney disease: Secondary | ICD-10-CM | POA: Diagnosis not present

## 2019-03-26 DIAGNOSIS — Z720 Tobacco use: Secondary | ICD-10-CM | POA: Diagnosis not present

## 2019-03-26 MED ORDER — PREDNISONE 20 MG PO TABS: 20.0000 mg | ORAL_TABLET | Freq: Two times a day (BID) | ORAL | 0 refills | Status: DC

## 2019-03-26 NOTE — ED Triage Notes (Signed)
Pt states has had non-productive cough, nasal congestion, SOB x 3.5 months and has had two negative covid tests. Pt noticeably sob with talking; states when she bends over it "feels like something is in my throat".  Also c/o fatigue, lightheadedness mild;  H/o occipital neuralgia and has pain/tightness b/t shoulder blades with SOB with exertion.   Denies: abd pain, n/v/d, fever, chills.  Pt Rx Lasix 3 times/week as needed, states has not taken this past week. +1 edema bilat legs.

## 2019-03-26 NOTE — ED Provider Notes (Signed)
[place]    CSN: [id number] Arrival date & time: 03/26/19  1545      History   Chief Complaint Chief Complaint  Patient presents with  . Shortness of Breath  . Cough    HPI Kerry Powell is a 74 y.o. female.   HPI  This is a pleasant 74 year old woman who states she has been having shortness of breath for 3 and half months.  It has not been thoroughly worked up, she states, because she is unable to get into see her primary care doctor with her symptoms.  There are restrictions because of coronavirus.  She has had 2 coronavirus test that are negative.  A third test is performed today.  She also has some runny nose postnasal drip symptoms that may be allergies or infectious.  She was treated with a course of antibiotics and cough medicine.  Her shortness of breath and coughing persist, and appear to be getting worse.  When she leans forward at the waist she feels like her throat is closing and she "chokes".  She states that she has pain and tightness between her shoulder blades.  She thinks this is part of her occipital neuralgia.  She is noticeably more short of breath with any walking or exertion.  At times she feels like her heart is pounding.  She does not have chest pain.  She does have swelling in her ankles but feels like this has been stable.  She takes Lasix 3 times a week to control it. I have reviewed her medical record.  Her last chest x-ray was normal in 2018.  She had an echocardiogram in March 2020 that showed no congestive heart failure, normal ejection fraction, mild left ventricular thickening.  She had a BNP done at that time which was normal.  She has been told by her cardiologist that she does not have congestive heart failure.  She was told by her nephrologist that she might. She was a smoker until 2011.  She does not think she has any COPD or lung damage from her years of cigarette smoking.  She is not using any inhalers, does not feel like she has  wheezing. She has an appointment with her primary care doctor on 2/1-20 21.  She came in today because her shortness of breath felt worse, and it bothered her.  Past Medical History:  Diagnosis Date  . Anxiety   . Arthritis   . BACK PAIN, CHRONIC 04/04/2007   Qualifier: Diagnosis of  By: Carlean Purl MD, Dimas Millin BIPOLAR AFFECTIVE DISORDER 04/04/2007   Qualifier: History of  By: Carlean Purl MD, Dimas Millin Cataract    removed both eyes  . Chronic neck pain 04/30/2015   Cervical spondylosis [facility] Intermittent numbness and tingling in occipital region and headaches S/p 2 occipital nerve blocks - only temp relief no relief with gabapentin, ultram, brace MRI neck - facet arthrosis   . CKD (chronic kidney disease) 04/26/2015  . COPD (chronic obstructive pulmonary disease) ([place])    pt denies - told this while was smoking -  . Cough 04/26/2015  . Depression 07/01/2017  . Diabetes ([place]) 04/27/2015   Diagnosed 03/2015   . Dizziness 06/10/2017  . Dysphagia 04/26/2015  . DYSPNEA 12/24/2007   Qualifier: Diagnosis of  By: Glennis Brink    . Elevated TSH 08/09/2015  . GASTRIC ULCER, ACUTE 04/07/2007   Annotation: EGD 04/07/07 Qualifier: Diagnosis of  By: Carlean Purl MD,  FACG, [place] 04/07/2007   Annotation: EGD 04/07/07 Qualifier: Diagnosis of  By: Carlean Purl MD, Dimas Millin GERD (gastroesophageal reflux disease)   . H/O renal cell carcinoma 05/23/2017   Dx 2010, s/p cryoablation Follows with urology - Dr Lovena Neighbours  . HIATAL HERNIA 04/07/2007   Annotation: EGD 04/07/07 Qualifier: Diagnosis of  By: Carlean Purl MD, Dimas Millin History of CVA (cerebrovascular accident) 04/26/2015  . History of gout 04/26/2015  . Hyperlipidemia   . Hypertension   . Irritable bowel syndrome 04/04/2007   Qualifier: Diagnosis of  By: Carlean Purl MD, Dimas Millin Kidney tumor (benign)   . Nausea and vomiting 09/09/2017  . 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 OSTEOARTHRITIS 04/04/2007   Qualifier: Diagnosis of  By: Carlean Purl MD, Dimas Millin Personal history of colonic adenoma 06/07/2012  . POSTNASAL DRIP SYNDROME 12/29/2007   Qualifier: Diagnosis of  By: Chase Caller MD, [name]    . RESTLESS LEG SYNDROME 04/04/2007   Qualifier: Diagnosis of  By: Carlean Purl MD, FACG, [place], 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   Qualifier: Diagnosis of  By: Carlean Purl MD, Dimas Millin Sleep difficulties 09/09/2017  . Snoring 04/26/2015  . Stroke [facility])    x2 2011  . Thoracic aortic aneurysm ([place]) 02/06/2018   Seen on MRI 01/25/18 of thoracic spine ordered by [facility] -- referred to CTS to monitor  -- 3.5 cm  . TIA (transient ischemic attack)   . TOBACCO ABUSE 12/17/2007   Qualifier: Diagnosis of  By: Chase Caller MD, [name]    . Trigeminal neuralgia of right side of face 02/08/2016  . URI (upper respiratory infection) 02/08/2016  . Vitamin D deficiency 03/12/2018    Patient Active Problem List   Diagnosis Date Noted  . Spasm of thoracic back muscle 11/14/2018  . SOB (shortness of breath) 04/17/2018  . Vitamin D deficiency 03/12/2018  . Thoracic aortic aneurysm ([place]) 02/06/2018  . Sleep difficulties 09/09/2017  . Nausea and vomiting 09/09/2017  . Depression 07/01/2017  . Dizziness 06/10/2017  . H/O renal cell carcinoma 05/23/2017  . Hyperlipidemia 06/25/2016  . Occipital neuralgia 05/09/2016  . URI (upper respiratory infection) 02/08/2016  . Anxiety 07/26/2015  . Chronic neck pain 04/30/2015  . Diabetes ([name]) 04/27/2015  . History of CVA (cerebrovascular accident) 04/26/2015  . History of gout 04/26/2015  . CKD (chronic kidney disease) 04/26/2015  . Essential hypertension, benign 04/26/2015  . Snoring 04/26/2015  . Localized edema 04/26/2015  . Dysphagia 04/26/2015  . GERD (gastroesophageal reflux disease) 04/26/2015  . Personal history of colonic adenoma 05/29/2007  . HIATAL HERNIA 04/07/2007  . RESTLESS LEG SYNDROME 04/04/2007  .  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 COPD 04/04/2007  . IRRITABLE BOWEL SYNDROME 04/04/2007  . OSTEOARTHRITIS 04/04/2007    Past Surgical History:  Procedure Laterality Date  . ABDOMINAL HYSTERECTOMY    . CATARACT EXTRACTION    . COLONOSCOPY    . CYSTOCELE REPAIR    . ESOPHAGOGASTRODUODENOSCOPY    . KIDNEY SURGERY     benign tumor removal  . ORTHOPEDIC SURGERY     Wrist & elbow  . POLYPECTOMY    . RECTOCELE REPAIR    . UPPER GASTROINTESTINAL ENDOSCOPY      OB History   No obstetric history on file.      Home Medications    Prior  to Admission medications   Medication Sig Start Date End Date Taking? Authorizing Provider  allopurinol (ZYLOPRIM) 300 MG tablet TAKE 1 TABLET BY MOUTH EVERY DAY 03/02/19  Yes Burns, [name], MD  amLODipine (NORVASC) 5 MG tablet TAKE 1.5 TABLETS (7.5 MG TOTAL) BY MOUTH DAILY. 12/09/18  Yes Burns, [name], MD  clopidogrel (PLAVIX) 75 MG tablet TAKE 1 TABLET BY MOUTH EVERY DAY 12/26/18  Yes Burns, [name], MD  DULoxetine (CYMBALTA) 60 MG capsule TAKE 1 CAPSULE BY MOUTH EVERY DAY 11/27/18  Yes Burns, [name], MD  furosemide (LASIX) 20 MG tablet Take 20 mg by mouth. 3 times a week as needed for swelling   Yes [provider]  glimepiride (AMARYL) 2 MG tablet TAKE 1 TABLET BY MOUTH DAILY BEFORE BREAKFAST 03/06/19  Yes Burns, [name], MD  metFORMIN (GLUCOPHAGE) 500 MG tablet Take 1 tablet (500 mg total) by mouth 2 (two) times daily with a meal. 08/05/18  Yes Burns, [name], MD  metoprolol succinate (TOPROL-XL) 25 MG 24 hr tablet TAKE 1 TABLET BY MOUTH EVERY DAY 12/01/18  Yes Burns, [name], MD  pantoprazole (PROTONIX) 40 MG tablet TAKE 1 TABLET BY MOUTH EVERY DAY 09/30/18  Yes Burns, [name], MD  rosuvastatin (CRESTOR) 10 MG tablet TAKE 1 TABLET BY MOUTH EVERY DAY 03/06/19  Yes Burns, [name], MD  telmisartan (MICARDIS) 80 MG tablet TAKE 1 TABLET BY MOUTH EVERY DAY Patient taking differently: 40 mg.  09/12/18  Yes Burns, [name], MD  cyclobenzaprine  (FLEXERIL) 5 MG tablet Take 1 tablet (5 mg total) by mouth 3 (three) times daily as needed for muscle spasms. 11/14/18   Binnie Rail, MD  ketoconazole (NIZORAL) 2 % cream APPLY TO AFFECTED AREA UP TO TWICE A DAY AS NEEDED 11/14/15   [provider]  predniSONE (DELTASONE) 20 MG tablet Take 1 tablet (20 mg total) by mouth 2 (two) times daily with a meal. 03/26/19   Raylene Everts, MD  promethazine-dextromethorphan (PROMETHAZINE-DM) 6.25-15 MG/5ML syrup Take 5 mLs by mouth 4 (four) times daily as needed for cough. 01/29/19   Binnie Rail, MD  sodium chloride (OCEAN) 0.65 % SOLN nasal spray Place 1 spray into both nostrils as needed for congestion. 06/20/18   Leonie Douglas, PA-C    Family History Family History  Problem Relation Age of Onset  . Congestive Heart Failure Mother   . Cancer Mother        ovarian  . Heart disease Father   . Diabetes Father   . Kidney disease Father   . Diabetes Sister   . Kidney disease Sister   . Colon cancer Neg Hx   . Colon polyps Neg Hx   . Esophageal cancer Neg Hx   . Rectal cancer Neg Hx   . Stomach cancer Neg Hx     Social History Social History   Tobacco Use  . Smoking status: Former Smoker    Quit date: 06/30/2009    Years since quitting: 9.7  . Smokeless tobacco: Never Used  Substance Use Topics  . Alcohol use: No  . Drug use: No     Allergies   Gabapentin, Clindamycin, and Sulfa antibiotics   Review of Systems Review of Systems  Constitutional: Positive for fatigue. Negative for chills, fever and unexpected weight change.  HENT: Positive for congestion, postnasal drip and rhinorrhea.   Respiratory: Positive for cough, chest tightness and shortness of breath.   Cardiovascular: Positive for palpitations and leg  swelling.  Gastrointestinal: Negative for nausea and vomiting.  Musculoskeletal: Positive for myalgias.  Neurological: Positive for headaches.     Physical Exam Triage Vital Signs ED Triage Vitals   Enc Vitals Group     BP 03/26/19 1616 (!) 143/85     Pulse Rate 03/26/19 1616 96     Resp 03/26/19 1616 20     Temp 03/26/19 1616 98.1 F (36.7 C)     Temp Source 03/26/19 1616 Oral     SpO2 03/26/19 1616 96 %     Weight --      Height --      Head Circumference --      Peak Flow --      Pain Score 03/26/19 1618 0     Pain Loc --      Pain Edu? --      Excl. in [place]? --    No data found.  Updated Vital Signs BP (!) 143/85 (BP Location: Right Arm)   Pulse 96   Temp 98.1 F (36.7 C) (Oral)   Resp 20   SpO2 96%      Physical Exam Constitutional:      General: She is not in acute distress.    Appearance: She is well-developed. She is obese.  HENT:     Head: Normocephalic and atraumatic.     Mouth/Throat:     Comments: Mouth dry Eyes:     Conjunctiva/sclera: Conjunctivae normal.     Pupils: Pupils are equal, round, and reactive to light.  Cardiovascular:     Rate and Rhythm: Normal rate and regular rhythm.  No extrasystoles are present.    Heart sounds: No murmur. No gallop.   Pulmonary:     Effort: Pulmonary effort is normal. Tachypnea present. No respiratory distress.     Breath sounds: Normal breath sounds. No decreased breath sounds, wheezing, rhonchi or rales.     Comments: Rate 22 at rest Chest:     Chest wall: No tenderness.  Abdominal:     General: Bowel sounds are normal. There is no distension.     Palpations: Abdomen is soft. There is no hepatomegaly or splenomegaly.  Musculoskeletal:        General: Normal range of motion.     Cervical back: Normal range of motion.     Right lower leg: Edema present.     Left lower leg: Edema present.  Skin:    General: Skin is warm and dry.  Neurological:     Mental Status: She is alert.  Psychiatric:        Mood and Affect: Mood normal.        Behavior: Behavior normal.      UC Treatments / Results  Labs (all labs ordered are listed, but only abnormal results are displayed) Labs Reviewed  NOVEL  CORONAVIRUS, NAA (HOSP ORDER, SEND-OUT TO REF LAB; TAT 18-24 HRS)    EKG-normal sinus rhythm rate 93, normal intervals, no ST or T wave changes   Radiology DG Chest 2 View  Result Date: 03/26/2019 CLINICAL DATA:  Shortness of breath for 2 months.  Ex-smoker. EXAM: CHEST - 2 VIEW COMPARISON:  04/04/2017 chest radiograph. FINDINGS: Mild hyperinflation. Midline trachea. Normal heart size. Tortuous thoracic aorta. Atherosclerosis in the transverse aorta. No pleural effusion or pneumothorax. Diffuse peribronchial thickening. IMPRESSION: No acute cardiopulmonary disease. Peribronchial thickening which may relate to chronic bronchitis or smoking. Aortic Atherosclerosis (ICD10-I70.0). Electronically Signed   By: Abigail Miyamoto M.D.   On:  03/26/2019 17:14    Procedures Procedures (including critical care time)  Medications Ordered in UC Medications - No data to display  Initial Impression / Assessment and Plan / UC Course  I have reviewed the triage vital signs and the nursing notes.  Pertinent labs & imaging results that were available during my care of the patient were reviewed by me and considered in my medical decision making (see chart for details).    I explained to the patient that work-up of shortness of breath included checking the lungs and heart.  She did have a recent evaluation to check for heart failure, 11 months ago, that was negative.  She does have a smoking history.  EKG was normal.  Chest x-ray was suggestive of hyperinflation and chronic bronchitis. I am going to treat with 5 days of prednisone 20 twice daily.  She has an appointment with her physician next week.  If she has a good response to this, it may help her primary care doctor diagnostically.  Final Clinical Impressions(s) / UC Diagnoses   Final diagnoses:  Shortness of breath     Discharge Instructions     Drink more water/fluids Take the prednisone 2 x a day for 5 days See Dr Quay Burow on 03/30/2019   ED  Prescriptions    Medication Sig Dispense Auth. Provider   predniSONE (DELTASONE) 20 MG tablet Take 1 tablet (20 mg total) by mouth 2 (two) times daily with a meal. 10 tablet Raylene Everts, MD     PDMP not reviewed this encounter.   Raylene Everts, MD 03/26/19 [id number]

## 2019-03-26 NOTE — Discharge Instructions (Signed)
Drink more water/fluids Take the prednisone 2 x a day for 5 days See Dr Quay Burow on 03/30/2019

## 2019-03-28 LAB — NOVEL CORONAVIRUS, NAA (HOSP ORDER, SEND-OUT TO REF LAB; TAT 18-24 HRS): SARS-CoV-2, NAA: NOT DETECTED

## 2019-03-29 NOTE — Progress Notes (Signed)
Subjective:    Patient ID: Kerry Powell, female    DOB: July 27, 1945, 74 y.o.   MRN: [id number]  HPI The patient is here for follow up of their chronic medical problems, including diabetes, hypertension, hyperlipidemia, ckd, gout, gerd, depression, h/o cva.  She has had chronic DOE.  Now she has SOB with talking, walking and mild exertion.  This started about 2 months ago.    She was recently seen at the respiratory clinic for SOB x 2 months.  She was sleeping all the time, had no energy, had cough, SOB - we did a virtual visit in December.  She went to urgent care on 1/28 and was tested for covid which was neg.  A chest xray was neg.  She took prednisone x 5 days.  Her SOB and cough are better.  Her body aches are better. She still has drainage.     Her sugars have been in 120-130's.  New complaint feels like her sugars are low in the afternoon it has to drink orange juice.  She typically does not check her sugars when she feels this way-she just assumes that they are low.     Medications and allergies reviewed with patient and updated if appropriate.  Patient Active Problem List   Diagnosis Date Noted  . Spasm of thoracic back muscle 11/14/2018  . SOB (shortness of breath) 04/17/2018  . Vitamin D deficiency 03/12/2018  . Thoracic aortic aneurysm ([place]) 02/06/2018  . Sleep difficulties 09/09/2017  . Nausea and vomiting 09/09/2017  . Depression 07/01/2017  . Dizziness 06/10/2017  . H/O renal cell carcinoma 05/23/2017  . Hyperlipidemia 06/25/2016  . Occipital neuralgia 05/09/2016  . URI (upper respiratory infection) 02/08/2016  . Anxiety 07/26/2015  . Chronic neck pain 04/30/2015  . Diabetes ([place]) 04/27/2015  . History of CVA (cerebrovascular accident) 04/26/2015  . History of gout 04/26/2015  . CKD (chronic kidney disease) 04/26/2015  . Essential hypertension, benign 04/26/2015  . Snoring 04/26/2015  . Localized edema 04/26/2015  . Dysphagia 04/26/2015  . GERD  (gastroesophageal reflux disease) 04/26/2015  . Personal history of colonic adenoma 05/29/2007  . HIATAL HERNIA 04/07/2007  . RESTLESS LEG SYNDROME 04/04/2007  . 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 COPD 04/04/2007  . IRRITABLE BOWEL SYNDROME 04/04/2007  . OSTEOARTHRITIS 04/04/2007    Current Outpatient Medications on File Prior to Visit  Medication Sig Dispense Refill  . allopurinol (ZYLOPRIM) 300 MG tablet TAKE 1 TABLET BY MOUTH EVERY DAY 90 tablet 0  . amLODipine (NORVASC) 5 MG tablet TAKE 1.5 TABLETS (7.5 MG TOTAL) BY MOUTH DAILY. 135 tablet 0  . clopidogrel (PLAVIX) 75 MG tablet TAKE 1 TABLET BY MOUTH EVERY DAY 90 tablet 1  . cyclobenzaprine (FLEXERIL) 5 MG tablet Take 1 tablet (5 mg total) by mouth 3 (three) times daily as needed for muscle spasms. 30 tablet 1  . DULoxetine (CYMBALTA) 60 MG capsule TAKE 1 CAPSULE BY MOUTH EVERY DAY 90 capsule 1  . furosemide (LASIX) 20 MG tablet Take 20 mg by mouth. 3 times a week as needed for swelling    . glimepiride (AMARYL) 2 MG tablet TAKE 1 TABLET BY MOUTH DAILY BEFORE BREAKFAST 90 tablet 1  . metFORMIN (GLUCOPHAGE) 500 MG tablet Take 1 tablet (500 mg total) by mouth 2 (two) times daily with a meal. 180 tablet 3  . metoprolol succinate (TOPROL-XL) 25 MG 24 hr tablet TAKE 1 TABLET BY MOUTH EVERY DAY 90 tablet 1  . pantoprazole (PROTONIX) 40 MG  tablet TAKE 1 TABLET BY MOUTH EVERY DAY 90 tablet 1  . predniSONE (DELTASONE) 20 MG tablet Take 1 tablet (20 mg total) by mouth 2 (two) times daily with a meal. 10 tablet 0  . rosuvastatin (CRESTOR) 10 MG tablet TAKE 1 TABLET BY MOUTH EVERY DAY 90 tablet 1  . sodium chloride (OCEAN) 0.65 % SOLN nasal spray Place 1 spray into both nostrils as needed for congestion. 60 mL 0  . telmisartan (MICARDIS) 80 MG tablet TAKE 1 TABLET BY MOUTH EVERY DAY (Patient taking differently: 40 mg. ) 90 tablet 1   No current facility-administered medications on file prior to visit.    Past Medical History:  Diagnosis  Date  . Anxiety   . Arthritis   . BACK PAIN, CHRONIC 04/04/2007   Qualifier: Diagnosis of  By: Carlean Purl MD, Dimas Millin BIPOLAR AFFECTIVE DISORDER 04/04/2007   Qualifier: History of  By: Carlean Purl MD, Dimas Millin Cataract    removed both eyes  . Chronic neck pain 04/30/2015   Cervical spondylosis [facility] Intermittent numbness and tingling in occipital region and headaches S/p 2 occipital nerve blocks - only temp relief no relief with gabapentin, ultram, brace MRI neck - facet arthrosis   . CKD (chronic kidney disease) 04/26/2015  . COPD (chronic obstructive pulmonary disease) ([place])    pt denies - told this while was smoking -  . Cough 04/26/2015  . Depression 07/01/2017  . Diabetes ([place]) 04/27/2015   Diagnosed 03/2015   . Dizziness 06/10/2017  . Dysphagia 04/26/2015  . DYSPNEA 12/24/2007   Qualifier: Diagnosis of  By: Glennis Brink    . Elevated TSH 08/09/2015  . GASTRIC ULCER, ACUTE 04/07/2007   Annotation: EGD 04/07/07 Qualifier: Diagnosis of  By: Carlean Purl MD, FACG, [place] 04/07/2007   Annotation: EGD 04/07/07 Qualifier: Diagnosis of  By: Carlean Purl MD, Dimas Millin   . GERD (gastroesophageal reflux disease)   . H/O renal cell carcinoma 05/23/2017   Dx 2010, s/p cryoablation Follows with urology - Dr Lovena Neighbours  . HIATAL HERNIA 04/07/2007   Annotation: EGD 04/07/07 Qualifier: Diagnosis of  By: Carlean Purl MD, Dimas Millin History of CVA (cerebrovascular accident) 04/26/2015  . History of gout 04/26/2015  . Hyperlipidemia   . Hypertension   . Irritable bowel syndrome 04/04/2007   Qualifier: Diagnosis of  By: Carlean Purl MD, Dimas Millin Kidney tumor (benign)   . Nausea and vomiting 09/09/2017  . 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 OSTEOARTHRITIS 04/04/2007   Qualifier: Diagnosis of  By: Carlean Purl MD, Dimas Millin Personal history of colonic adenoma 06/07/2012  . POSTNASAL DRIP SYNDROME 12/29/2007   Qualifier:  Diagnosis of  By: Chase Caller MD, [name]    . RESTLESS LEG SYNDROME 04/04/2007   Qualifier: Diagnosis of  By: Carlean Purl MD, FACG, [place], 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   Qualifier: Diagnosis of  By: Carlean Purl MD, Dimas Millin Sleep difficulties 09/09/2017  . Snoring 04/26/2015  . Stroke [facility])    x2 2011  . Thoracic aortic aneurysm ([place]) 02/06/2018   Seen on MRI 01/25/18 of thoracic spine ordered by [facility] -- referred to CTS to monitor  -- 3.5 cm  . TIA (transient ischemic attack)   . TOBACCO ABUSE 12/17/2007   Qualifier: Diagnosis of  By: Chase Caller MD, [name]    .  Trigeminal neuralgia of right side of face 02/08/2016  . URI (upper respiratory infection) 02/08/2016  . Vitamin D deficiency 03/12/2018    Past Surgical History:  Procedure Laterality Date  . ABDOMINAL HYSTERECTOMY    . CATARACT EXTRACTION    . COLONOSCOPY    . CYSTOCELE REPAIR    . ESOPHAGOGASTRODUODENOSCOPY    . KIDNEY SURGERY     benign tumor removal  . ORTHOPEDIC SURGERY     Wrist & elbow  . POLYPECTOMY    . RECTOCELE REPAIR    . UPPER GASTROINTESTINAL ENDOSCOPY      Social History   Socioeconomic History  . Marital status: Married    Spouse name: Not on file  . Number of children: 2  . Years of education: Not on file  . Highest education level: Not on file  Occupational History  . Occupation: Retired  Tobacco Use  . Smoking status: Former Smoker    Quit date: 06/30/2009    Years since quitting: 9.7  . Smokeless tobacco: Never Used  Substance and Sexual Activity  . Alcohol use: No  . Drug use: No  . Sexual activity: Not on file  Other Topics Concern  . Not on file  Social History Narrative   Married, retired   1 son 1 daughter   2 caffeine/day   Social Determinants of Health   Financial Resource Strain:   . Difficulty of Paying Living Expenses: Not on file  Food Insecurity:   . Worried About Charity fundraiser in the Last Year: Not on file  . Ran Out of Food in the Last Year: Not  on file  Transportation Needs:   . Lack of Transportation (Medical): Not on file  . Lack of Transportation (Non-Medical): Not on file  Physical Activity:   . Days of Exercise per Week: Not on file  . Minutes of Exercise per Session: Not on file  Stress:   . Feeling of Stress : Not on file  Social Connections:   . Frequency of Communication with Friends and Family: Not on file  . Frequency of Social Gatherings with Friends and Family: Not on file  . Attends Religious Services: Not on file  . Active Member of Clubs or Organizations: Not on file  . Attends Archivist Meetings: Not on file  . Marital Status: Not on file    Family History  Problem Relation Age of Onset  . Congestive Heart Failure Mother   . Cancer Mother        ovarian  . Heart disease Father   . Diabetes Father   . Kidney disease Father   . Diabetes Sister   . Kidney disease Sister   . Colon cancer Neg Hx   . Colon polyps Neg Hx   . Esophageal cancer Neg Hx   . Rectal cancer Neg Hx   . Stomach cancer Neg Hx     Review of Systems  Constitutional: Negative for chills and fever.  Respiratory: Positive for cough (rare sputum - white), shortness of breath (with exertion, with walking) and wheezing (? little - resolves with cough). Negative for chest tightness.   Cardiovascular: Positive for palpitations and leg swelling. Negative for chest pain.  Neurological: Negative for light-headedness and headaches.       Objective:   Vitals:   03/30/19 1403  BP: (!) 154/90  Pulse: 78  Resp: 20  Temp: 97.8 F (36.6 C)  SpO2: 97%   BP Readings from Last 3 Encounters:  03/30/19 (!) 154/90  03/26/19 (!) 143/85  11/14/18 118/60   Wt Readings from Last 3 Encounters:  03/30/19 235 lb (106.6 kg)  11/14/18 232 lb (105.2 kg)  09/10/18 232 lb (105.2 kg)   Body mass index is 39.11 kg/m.   Physical Exam    Constitutional: Appears well-developed and well-nourished. No distress.  HENT:  Head:  Normocephalic and atraumatic.  Neck: Neck supple. No tracheal deviation present. No thyromegaly present.  No cervical lymphadenopathy Cardiovascular: Normal rate, regular rhythm and normal heart sounds.   No murmur heard. No carotid bruit .  Mild bilateral lower extremity nonpitting edema Pulmonary/Chest: Effort normal and breath sounds normal. No respiratory distress. No has no wheezes. No rales.  Skin: Skin is warm and dry. Not diaphoretic.  Psychiatric: Normal mood and affect. Behavior is normal.      Assessment & Plan:    See Problem List for Assessment and Plan of chronic medical problems.    This visit occurred during the SARS-CoV-2 public health emergency.  Safety protocols were in place, including screening questions prior to the visit, additional usage of staff PPE, and extensive cleaning of exam room while observing appropriate contact time as indicated for disinfecting solutions.

## 2019-03-30 ENCOUNTER — Encounter: Payer: Self-pay | Admitting: Internal Medicine

## 2019-03-30 ENCOUNTER — Other Ambulatory Visit: Payer: Self-pay

## 2019-03-30 ENCOUNTER — Ambulatory Visit: Payer: Medicare Other | Admitting: Internal Medicine

## 2019-03-30 VITALS — BP 154/90 | HR 78 | Temp 97.8°F | Resp 20 | Ht 65.0 in | Wt 235.0 lb

## 2019-03-30 DIAGNOSIS — K219 Gastro-esophageal reflux disease without esophagitis: Secondary | ICD-10-CM | POA: Diagnosis not present

## 2019-03-30 DIAGNOSIS — E7849 Other hyperlipidemia: Secondary | ICD-10-CM | POA: Diagnosis not present

## 2019-03-30 DIAGNOSIS — E559 Vitamin D deficiency, unspecified: Secondary | ICD-10-CM | POA: Diagnosis not present

## 2019-03-30 DIAGNOSIS — I10 Essential (primary) hypertension: Secondary | ICD-10-CM | POA: Diagnosis not present

## 2019-03-30 DIAGNOSIS — R0602 Shortness of breath: Secondary | ICD-10-CM

## 2019-03-30 DIAGNOSIS — E119 Type 2 diabetes mellitus without complications: Secondary | ICD-10-CM | POA: Diagnosis not present

## 2019-03-30 DIAGNOSIS — N189 Chronic kidney disease, unspecified: Secondary | ICD-10-CM

## 2019-03-30 LAB — COMPREHENSIVE METABOLIC PANEL
ALT: 13 U/L (ref 0–35)
AST: 12 U/L (ref 0–37)
Albumin: 4.2 g/dL (ref 3.5–5.2)
Alkaline Phosphatase: 95 U/L (ref 39–117)
BUN: 25 mg/dL — ABNORMAL HIGH (ref 6–23)
CO2: 30 mEq/L (ref 19–32)
Calcium: 9.3 mg/dL (ref 8.4–10.5)
Chloride: 101 mEq/L (ref 96–112)
Creatinine, Ser: 1.39 mg/dL — ABNORMAL HIGH (ref 0.40–1.20)
GFR: 37.06 mL/min — ABNORMAL LOW (ref >60.00)
Glucose, Bld: 103 mg/dL — ABNORMAL HIGH (ref 70–99)
Potassium: 4.4 mEq/L (ref 3.5–5.1)
Sodium: 137 mEq/L (ref 135–145)
Total Bilirubin: 0.3 mg/dL (ref 0.2–1.2)
Total Protein: 7 g/dL (ref 6.0–8.3)

## 2019-03-30 LAB — LIPID PANEL
Cholesterol: 155 mg/dL (ref 0–200)
HDL: 44.8 mg/dL (ref >39.00)
LDL Cholesterol: 89 mg/dL (ref 0–99)
NonHDL: 110.03
Total CHOL/HDL Ratio: 3
Triglycerides: 107 mg/dL (ref 0.0–149.0)
VLDL: 21.4 mg/dL (ref 0.0–40.0)

## 2019-03-30 LAB — BRAIN NATRIURETIC PEPTIDE: Pro B Natriuretic peptide (BNP): 238 pg/mL — ABNORMAL HIGH (ref 0.0–100.0)

## 2019-03-30 LAB — CBC WITH DIFFERENTIAL/PLATELET
Basophils Absolute: 0.1 10*3/uL (ref 0.0–0.1)
Basophils Relative: 0.4 % (ref 0.0–3.0)
Eosinophils Absolute: 0 10*3/uL (ref 0.0–0.7)
Eosinophils Relative: 0.2 % (ref 0.0–5.0)
HCT: 35.8 % — ABNORMAL LOW (ref 36.0–46.0)
Hemoglobin: 11.1 g/dL — ABNORMAL LOW (ref 12.0–15.0)
Lymphocytes Relative: 14.7 % (ref 12.0–46.0)
Lymphs Abs: 2 10*3/uL (ref 0.7–4.0)
MCHC: 31.1 g/dL (ref 30.0–36.0)
MCV: 85.2 fl (ref 78.0–100.0)
Monocytes Absolute: 0.8 10*3/uL (ref 0.1–1.0)
Monocytes Relative: 6.2 % (ref 3.0–12.0)
Neutro Abs: 10.7 10*3/uL — ABNORMAL HIGH (ref 1.4–7.7)
Neutrophils Relative %: 78.5 % — ABNORMAL HIGH (ref 43.0–77.0)
Platelets: 368 10*3/uL (ref 150.0–400.0)
RBC: 4.2 Mil/uL (ref 3.87–5.11)
RDW: 16.3 % — ABNORMAL HIGH (ref 11.5–15.5)
WBC: 13.6 10*3/uL — ABNORMAL HIGH (ref 4.0–10.5)

## 2019-03-30 LAB — VITAMIN D 25 HYDROXY (VIT D DEFICIENCY, FRACTURES): VITD: 27.3 ng/mL — ABNORMAL LOW (ref 30.00–100.00)

## 2019-03-30 MED ORDER — PREDNISONE 20 MG PO TABS: 20.0000 mg | ORAL_TABLET | Freq: Every day | ORAL | 0 refills | Status: AC

## 2019-03-30 MED ORDER — UMECLIDINIUM-VILANTEROL 62.5-25 MCG/INH IN AEPB: 1.0000 | INHALATION_SPRAY | Freq: Every day | RESPIRATORY_TRACT | 5 refills | Status: DC

## 2019-03-30 NOTE — Assessment & Plan Note (Signed)
Vitamin D deficiency-chronic Taking vitamin D Check level

## 2019-03-30 NOTE — Assessment & Plan Note (Signed)
Chronic Blood pressure elevated here today, but better at home-typically in the 130s We will continue current medication at current doses for now Continue to monitor BP at home CMP

## 2019-03-30 NOTE — Assessment & Plan Note (Signed)
Chronic BNP, CMP ?  Shortness of breath related to overload versus COPD exacerbation - more likely the latter

## 2019-03-30 NOTE — Assessment & Plan Note (Signed)
Sugars 120-130s at home, has feeling of hypoglycemia in the afternoon and often drinks juice because of that Will try holding glimepiride Continue to monitor sugars at home A1c today

## 2019-03-30 NOTE — Patient Instructions (Addendum)
  Blood work was ordered.     Medications reviewed and updated.  Changes include :   Hold the glimepiride for now and monitor your sugars. Try the inhaler once a day - rinse your mouth out after using it.  After you finish the prednisone course - continue 20 mg for once a day for 5 more days.     Your prescription(s) have been submitted to your pharmacy. Please take as directed and contact our office if you believe you are having problem(s) with the medication(s).  A referral was ordered for pulmonary   Please followup in 6 months

## 2019-03-30 NOTE — Assessment & Plan Note (Signed)
Chronic Check lipid panel  Continue daily statin Regular exercise and healthy diet encouraged  

## 2019-03-30 NOTE — Assessment & Plan Note (Signed)
Chronic GERD controlled Continue daily medication  

## 2019-03-30 NOTE — Assessment & Plan Note (Signed)
Acute on chronic SOB that started about two months ago Likely COPD exacerbation Does not seem like she has an active infection, so will hold off on Abx covid neg, CXR shows no PNA Prednisone helped - still has one day left -- will extend with lower dose - 20 mg daily Start Anoro daily BNP, cbc, cmp to r/o other causes Will refer to pulm

## 2019-03-31 ENCOUNTER — Other Ambulatory Visit: Payer: Self-pay

## 2019-03-31 LAB — HEMOGLOBIN A1C: Hgb A1c MFr Bld: 7.3 % — ABNORMAL HIGH (ref 4.6–6.5)

## 2019-04-05 ENCOUNTER — Encounter: Payer: Self-pay | Admitting: Internal Medicine

## 2019-04-08 ENCOUNTER — Telehealth: Payer: Self-pay | Admitting: Internal Medicine

## 2019-04-08 MED ORDER — AMOXICILLIN-POT CLAVULANATE 875-125 MG PO TABS: 1.0000 | ORAL_TABLET | Freq: Two times a day (BID) | ORAL | 0 refills | Status: DC

## 2019-04-08 MED ORDER — LEVOFLOXACIN 500 MG PO TABS: 500.0000 mg | ORAL_TABLET | Freq: Every day | ORAL | 0 refills | Status: DC

## 2019-04-08 NOTE — Addendum Note (Signed)
Addended by: Binnie Rail on: 04/08/2019 03:41 PM   Modules accepted: Orders

## 2019-04-08 NOTE — Telephone Encounter (Signed)
    Pt c/o medication issue:  1. Name of Medication: levofloxacin (LEVAQUIN) 500 MG tablet  2. How are you currently taking this medication (dosage and times per day)? n/a  3. Are you having a reaction (difficulty breathing--STAT)? no  4. What is your medication issue? Patient calling to request amoxicillin rather than Levaquin. She is concerned about possible side effects/ joint pain. Please call

## 2019-04-20 ENCOUNTER — Ambulatory Visit (INDEPENDENT_AMBULATORY_CARE_PROVIDER_SITE_OTHER)
Admission: RE | Admit: 2019-04-20 | Discharge: 2019-04-20 | Disposition: A | Discharge status: Home / Self Care | Payer: Medicare Other | Source: Ambulatory Visit | Attending: Cardiology | Admitting: Cardiology

## 2019-04-20 ENCOUNTER — Other Ambulatory Visit: Payer: Self-pay

## 2019-04-20 DIAGNOSIS — I712 Thoracic aortic aneurysm, without rupture, unspecified: Secondary | ICD-10-CM

## 2019-04-20 MED ORDER — IOHEXOL 350 MG/ML SOLN
80.0000 mL | Freq: Once | INTRAVENOUS | Status: AC | PRN
  Administered 2019-04-20: 12:00:00 80 mL via INTRAVENOUS

## 2019-04-21 ENCOUNTER — Telehealth: Payer: Self-pay | Admitting: Cardiology

## 2019-04-21 NOTE — Telephone Encounter (Signed)
Spoke with patient and notified her of results. Patient verbalized understanding. Results have been forwarded to PCP.   Called [facility] specialist to get her an appointment. Spoke with Elmo Putt who said they would call back first thing tomorrow morning to get an appointment for the patient.

## 2019-04-21 NOTE — Telephone Encounter (Signed)
Diane from [facility] calling with a call report on the patient's CT angio report.

## 2019-04-21 NOTE — Telephone Encounter (Signed)
Please let patient know that CHest CT showed an abnormality in her lung that needs followup in 6-12 weeks.  Please forward this to PCP and get patient an appt with her PCP to discuss this in the next week.    Also she has significant atherosclerosis of her descending aorta with possible ulcerated plaque.  Please get her in with Vascular surgery tomorrow.

## 2019-04-21 NOTE — Telephone Encounter (Signed)
Kerry Powell with [facility] called to give report to a nurse on these findings for patient's CT.  IMPRESSION: 1. Developing plaque-like region of probable pleuroparenchymal scarring at the left lung apex compared to relatively remote prior imaging from September of 2012. However, due to the change in appearance since the prior study and the patient's underlying emphysema and presumed smoking history, a developing malignancy such as a Pancoast tumor is difficult to exclude entirely. Recommend follow-up CT scan of the chest in 6-12 weeks to assess for stability. If there is evidence of interval change, then PET-CT would likely be warranted. 2. Extensive, irregular and ulcerated fibrofatty atherosclerotic plaque throughout the descending thoracic aorta with multifocal areas of small penetrating atherosclerotic ulcers. No evidence of aneurysm or dissection. Aortic Atherosclerosis (ICD10-170.0) 3. Coronary artery calcifications. 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   Will forward to Dr. Radford Pax to review.

## 2019-04-22 NOTE — Telephone Encounter (Signed)
Follow up   Kerry Powell from [facility] is calling back and says she doesn't need a call back, ty

## 2019-04-22 NOTE — Telephone Encounter (Signed)
Follow up  Lyn from [facility] returning call to get clarification on which doctor the pt needs to see

## 2019-04-24 ENCOUNTER — Telehealth (HOSPITAL_COMMUNITY): Payer: Self-pay

## 2019-04-24 NOTE — Telephone Encounter (Signed)

## 2019-04-26 NOTE — Progress Notes (Signed)
Subjective:    Patient ID: Kerry Powell, female    DOB: 09/28/1945, 74 y.o.   MRN: [id number]  HPI The patient is here for an acute visit.   She was asked to follow-up here by Dr. Radford Pax after her recent CT scan of her chest.  She had an extensive irregular and ulcerated fibrofatty atherosclerotic plaque throughout the descending thoracic aorta with multifocal areas of small penetrating atherosclerotic ulcers.  She saw cardiovascular surgery this morning.  The CT scan also showed a developing plaque-like region of probable pleural-parenchymal scarring at the left lung apex compared to her last CT scan September 2012.  Radiologist stated that due to the change in appearance and the patient's underlying emphysema and presumed smoking history I developing malignancy such as a Pancoast tumor is difficult to exclude entirely.  Radiology recommended a follow-up CT scan of the chest in 6-12 weeks to assess stability.  If there is any change a PET CT is necessary.    Her SOB has gotten worse.  She still has a cough with sputum in her chest which she does not bring up.  She is sleeping a lot. She continues to have mid central back pain that hurts more with movement and certain activities.  She is taking Anoro daily and it helps, but does not last all day.        CT ANGIO CHEST AORTA W &/OR WO CONTRAST CLINICAL DATA:  74 year old female with a history of thoracic aortic aneurysm  EXAM: CT ANGIOGRAPHY CHEST WITH CONTRAST  TECHNIQUE: Multidetector CT imaging of the chest was performed using the standard protocol during bolus administration of intravenous contrast. Multiplanar CT image reconstructions and MIPs were obtained to evaluate the vascular anatomy.  CONTRAST:  23mL OMNIPAQUE IOHEXOL 350 MG/ML SOLN  COMPARISON:  Prior CTA abdomen and pelvis 05/08/2018; prior CTA of the chest 11/03/2010  FINDINGS: Cardiovascular: 2 vessel aortic arch. The right brachiocephalic and left common  carotid artery share a common origin. Heterogeneous, irregular and ulcerated atherosclerotic plaque noted beginning at the aortic isthmus and extending throughout the descending thoracic aorta. There are likely numerous small penetrating atherosclerotic ulcers. No evidence of dissection. The aortic root is normal in caliber. The tubular portion of the ascending thoracic aorta is at the upper limits of normal in size at 3.8 cm. The main pulmonary artery is normal in size. Calcifications are visualized along the coronary arteries. The heart is normal in size. No pericardial effusion.  Mediastinum/Nodes: No enlarged mediastinal, hilar, or axillary lymph nodes. Thyroid gland, trachea, and esophagus demonstrate no significant findings.  Lungs/Pleura: Moderately severe centrilobular pulmonary emphysema demonstrates slight interval progression compared to 2017. Increasing conspicuity of pleuroparenchymal scarring, particularly in the left lung apex. A somewhat masslike region of soft tissue density with spiculated margins measures approximately 3.0 x 1.9 cm.  Upper Abdomen: No acute abnormality.  Musculoskeletal: No chest wall abnormality. No acute or significant osseous findings.  Review of the MIP images confirms the above findings.  IMPRESSION: 1. Developing plaque-like region of probable pleuroparenchymal scarring at the left lung apex compared to relatively remote prior imaging from September of 2012. However, due to the change in appearance since the prior study and the patient's underlying emphysema and presumed smoking history, a developing malignancy such as a Pancoast tumor is difficult to exclude entirely. Recommend follow-up CT scan of the chest in 6-12 weeks to assess for stability. If there is evidence of interval change, then PET-CT would likely be warranted. 2. Extensive,  irregular and ulcerated fibrofatty atherosclerotic plaque throughout the descending thoracic aorta  with multifocal areas of small penetrating atherosclerotic ulcers. No evidence of aneurysm or dissection. Aortic Atherosclerosis (ICD10-170.0) 3. Coronary artery calcifications. 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  These results will be called to the ordering clinician or representative by the Radiologist Assistant, and communication documented in the PACS or zVision Dashboard.  Signed,  Criselda Peaches, MD, [place]  [facility]  [facility]  Electronically Signed   By: Jacqulynn Cadet M.D.   On: 04/21/2019 15:12    Medications and allergies reviewed with patient and updated if appropriate.  Patient Active Problem List   Diagnosis Date Noted  . Spasm of thoracic back muscle 11/14/2018  . SOB (shortness of breath) 04/17/2018  . Vitamin D deficiency 03/12/2018  . Thoracic aortic aneurysm ([place]) 02/06/2018  . Sleep difficulties 09/09/2017  . Nausea and vomiting 09/09/2017  . Depression 07/01/2017  . Dizziness 06/10/2017  . H/O renal cell carcinoma 05/23/2017  . Hyperlipidemia 06/25/2016  . Occipital neuralgia 05/09/2016  . URI (upper respiratory infection) 02/08/2016  . Anxiety 07/26/2015  . Chronic neck pain 04/30/2015  . Diabetes ([place]) 04/27/2015  . History of CVA (cerebrovascular accident) 04/26/2015  . History of gout 04/26/2015  . CKD (chronic kidney disease) 04/26/2015  . Essential hypertension, benign 04/26/2015  . Snoring 04/26/2015  . Localized edema 04/26/2015  . Dysphagia 04/26/2015  . GERD (gastroesophageal reflux disease) 04/26/2015  . Personal history of colonic adenoma 05/29/2007  . HIATAL HERNIA 04/07/2007  . RESTLESS LEG SYNDROME 04/04/2007  . 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 COPD 04/04/2007  . IRRITABLE BOWEL SYNDROME 04/04/2007  . OSTEOARTHRITIS 04/04/2007    Current Outpatient Medications on File Prior to Visit  Medication Sig Dispense Refill  . allopurinol  (ZYLOPRIM) 300 MG tablet TAKE 1 TABLET BY MOUTH EVERY DAY 90 tablet 0  . amLODipine (NORVASC) 5 MG tablet TAKE 1.5 TABLETS (7.5 MG TOTAL) BY MOUTH DAILY. 135 tablet 0  . clopidogrel (PLAVIX) 75 MG tablet TAKE 1 TABLET BY MOUTH EVERY DAY 90 tablet 1  . cyclobenzaprine (FLEXERIL) 5 MG tablet Take 1 tablet (5 mg total) by mouth 3 (three) times daily as needed for muscle spasms. 30 tablet 1  . DULoxetine (CYMBALTA) 60 MG capsule TAKE 1 CAPSULE BY MOUTH EVERY DAY 90 capsule 1  . furosemide (LASIX) 20 MG tablet Take 20 mg by mouth. 3 times a week as needed for swelling    . glimepiride (AMARYL) 2 MG tablet TAKE 1 TABLET BY MOUTH DAILY BEFORE BREAKFAST 90 tablet 1  . metFORMIN (GLUCOPHAGE) 500 MG tablet Take 1 tablet (500 mg total) by mouth 2 (two) times daily with a meal. 180 tablet 3  . metoprolol succinate (TOPROL-XL) 25 MG 24 hr tablet TAKE 1 TABLET BY MOUTH EVERY DAY 90 tablet 1  . pantoprazole (PROTONIX) 40 MG tablet TAKE 1 TABLET BY MOUTH EVERY DAY 90 tablet 1  . rosuvastatin (CRESTOR) 10 MG tablet TAKE 1 TABLET BY MOUTH EVERY DAY 90 tablet 1  . sodium chloride (OCEAN) 0.65 % SOLN nasal spray Place 1 spray into both nostrils as needed for congestion. 60 mL 0  . telmisartan (MICARDIS) 80 MG tablet TAKE 1 TABLET BY MOUTH EVERY DAY (Patient taking differently: 40 mg. ) 90 tablet 1  . umeclidinium-vilanterol (ANORO ELLIPTA) 62.5-25 MCG/INH AEPB Inhale 1 puff into the lungs daily. 60 each 5   No current facility-administered medications on file  prior to visit.    Past Medical History:  Diagnosis Date  . Anxiety   . Arthritis   . BACK PAIN, CHRONIC 04/04/2007   Qualifier: Diagnosis of  By: Carlean Purl MD, Dimas Millin BIPOLAR AFFECTIVE DISORDER 04/04/2007   Qualifier: History of  By: Carlean Purl MD, Dimas Millin Cataract    removed both eyes  . Chronic neck pain 04/30/2015   Cervical spondylosis [facility] Intermittent numbness and tingling in occipital region and headaches S/p 2  occipital nerve blocks - only temp relief no relief with gabapentin, ultram, brace MRI neck - facet arthrosis   . CKD (chronic kidney disease) 04/26/2015  . COPD (chronic obstructive pulmonary disease) ([place])    pt denies - told this while was smoking -  . Cough 04/26/2015  . Depression 07/01/2017  . Diabetes ([place]) 04/27/2015   Diagnosed 03/2015   . Dizziness 06/10/2017  . Dysphagia 04/26/2015  . DYSPNEA 12/24/2007   Qualifier: Diagnosis of  By: Glennis Brink    . Elevated TSH 08/09/2015  . GASTRIC ULCER, ACUTE 04/07/2007   Annotation: EGD 04/07/07 Qualifier: Diagnosis of  By: Carlean Purl MD, FACG, [place] 04/07/2007   Annotation: EGD 04/07/07 Qualifier: Diagnosis of  By: Carlean Purl MD, Dimas Millin   . GERD (gastroesophageal reflux disease)   . H/O renal cell carcinoma 05/23/2017   Dx 2010, s/p cryoablation Follows with urology - Dr Lovena Neighbours  . HIATAL HERNIA 04/07/2007   Annotation: EGD 04/07/07 Qualifier: Diagnosis of  By: Carlean Purl MD, Dimas Millin History of CVA (cerebrovascular accident) 04/26/2015  . History of gout 04/26/2015  . Hyperlipidemia   . Hypertension   . Irritable bowel syndrome 04/04/2007   Qualifier: Diagnosis of  By: Carlean Purl MD, Dimas Millin Kidney tumor (benign)   . Nausea and vomiting 09/09/2017  . 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 OSTEOARTHRITIS 04/04/2007   Qualifier: Diagnosis of  By: Carlean Purl MD, Dimas Millin Personal history of colonic adenoma 06/07/2012  . POSTNASAL DRIP SYNDROME 12/29/2007   Qualifier: Diagnosis of  By: Chase Caller MD, [name]    . RESTLESS LEG SYNDROME 04/04/2007   Qualifier: Diagnosis of  By: Carlean Purl MD, FACG, [place], 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   Qualifier: Diagnosis of  By: Carlean Purl MD, Dimas Millin Sleep difficulties 09/09/2017  . Snoring 04/26/2015  . Stroke [facility])    x2 2011  . Thoracic aortic aneurysm ([place]) 02/06/2018   Seen on MRI 01/25/18 of thoracic spine ordered  by [facility] -- referred to CTS to monitor  -- 3.5 cm  . TIA (transient ischemic attack)   . TOBACCO ABUSE 12/17/2007   Qualifier: Diagnosis of  By: Chase Caller MD, [name]    . Trigeminal neuralgia of right side of face 02/08/2016  . URI (upper respiratory infection) 02/08/2016  . Vitamin D deficiency 03/12/2018    Past Surgical History:  Procedure Laterality Date  . ABDOMINAL HYSTERECTOMY    . CATARACT EXTRACTION    . COLONOSCOPY    . CYSTOCELE REPAIR    . ESOPHAGOGASTRODUODENOSCOPY    . KIDNEY SURGERY     benign tumor removal  . ORTHOPEDIC SURGERY     Wrist & elbow  . POLYPECTOMY    . RECTOCELE REPAIR    . UPPER GASTROINTESTINAL ENDOSCOPY      Social  History   Socioeconomic History  . Marital status: Married    Spouse name: Not on file  . Number of children: 2  . Years of education: Not on file  . Highest education level: Not on file  Occupational History  . Occupation: Retired  Tobacco Use  . Smoking status: Former Smoker    Quit date: 06/30/2009    Years since quitting: 9.8  . Smokeless tobacco: Never Used  Substance and Sexual Activity  . Alcohol use: No  . Drug use: No  . Sexual activity: Not on file  Other Topics Concern  . Not on file  Social History Narrative   Married, retired   1 son 1 daughter   2 caffeine/day   Social Determinants of Health   Financial Resource Strain:   . Difficulty of Paying Living Expenses: Not on file  Food Insecurity:   . Worried About Charity fundraiser in the Last Year: Not on file  . Ran Out of Food in the Last Year: Not on file  Transportation Needs:   . Lack of Transportation (Medical): Not on file  . Lack of Transportation (Non-Medical): Not on file  Physical Activity:   . Days of Exercise per Week: Not on file  . Minutes of Exercise per Session: Not on file  Stress:   . Feeling of Stress : Not on file  Social Connections:   . Frequency of Communication with Friends and Family: Not on file  . Frequency of  Social Gatherings with Friends and Family: Not on file  . Attends Religious Services: Not on file  . Active Member of Clubs or Organizations: Not on file  . Attends Archivist Meetings: Not on file  . Marital Status: Not on file    Family History  Problem Relation Age of Onset  . Congestive Heart Failure Mother   . Cancer Mother        ovarian  . Heart disease Father   . Diabetes Father   . Kidney disease Father   . Diabetes Sister   . Kidney disease Sister   . Colon cancer Neg Hx   . Colon polyps Neg Hx   . Esophageal cancer Neg Hx   . Rectal cancer Neg Hx   . Stomach cancer Neg Hx     Review of Systems  Constitutional: Positive for fatigue. Negative for fever.  Respiratory: Positive for cough, shortness of breath and wheezing (mild, intermittent).   Musculoskeletal: Positive for back pain (mid back ).       Objective:   Vitals:   04/27/19 1423  BP: 136/74  Pulse: 86  Resp: 16  Temp: 97.6 F (36.4 C)  SpO2: 94%   BP Readings from Last 3 Encounters:  04/27/19 136/74  04/27/19 130/82  03/30/19 (!) 154/90   Wt Readings from Last 3 Encounters:  04/27/19 235 lb (106.6 kg)  04/27/19 235 lb (106.6 kg)  03/30/19 235 lb (106.6 kg)   Body mass index is 39.11 kg/m.   Physical Exam    Constitutional: Appears well-developed and well-nourished. No distress.  Head: Normocephalic and atraumatic.  Neck: Neck supple. No tracheal deviation present. No thyromegaly present.  No cervical lymphadenopathy Cardiovascular: Normal rate, regular rhythm and normal heart sounds.  No murmur heard. No carotid bruit .  No edema Pulmonary/Chest: Effort normal and breath sounds normal. No respiratory distress. No has no wheezes. No rales.  Skin: Skin is warm and dry. Not diaphoretic.  Psychiatric: Normal mood and affect.  Behavior is normal.       Assessment & Plan:    She has not heard from pulmonary so we arranged a visit for her.  Reviewed CT scan and repeat Ct scan  was ordered.   See Problem List for Assessment and Plan of chronic medical problems.     This visit occurred during the SARS-CoV-2 public health emergency.  Safety protocols were in place, including screening questions prior to the visit, additional usage of staff PPE, and extensive cleaning of exam room while observing appropriate contact time as indicated for disinfecting solutions.

## 2019-04-27 ENCOUNTER — Ambulatory Visit: Payer: Medicare Other | Admitting: Surgery

## 2019-04-27 ENCOUNTER — Other Ambulatory Visit: Payer: Self-pay

## 2019-04-27 ENCOUNTER — Encounter: Payer: Self-pay | Admitting: Internal Medicine

## 2019-04-27 ENCOUNTER — Ambulatory Visit: Payer: Medicare Other | Admitting: Internal Medicine

## 2019-04-27 ENCOUNTER — Encounter: Payer: Self-pay | Admitting: Surgery

## 2019-04-27 VITALS — BP 136/74 | HR 86 | Temp 97.6°F | Resp 16 | Ht 65.0 in | Wt 235.0 lb

## 2019-04-27 VITALS — BP 130/82 | HR 78 | Temp 98.1°F | Resp 20 | Ht 65.0 in | Wt 235.0 lb

## 2019-04-27 DIAGNOSIS — J929 Pleural plaque without asbestos: Secondary | ICD-10-CM | POA: Diagnosis not present

## 2019-04-27 DIAGNOSIS — I712 Thoracic aortic aneurysm, without rupture, unspecified: Secondary | ICD-10-CM

## 2019-04-27 NOTE — Assessment & Plan Note (Signed)
Seen on recent CT scan - ? Scaring vs developing pancoast tumor Discussed f/u needed - will order Ct scan to be done 6 weeks after last CT Will be seeing pulmonary for SOB, cough and hopefully they can review CT and give her more information regarding this

## 2019-04-27 NOTE — Progress Notes (Signed)
[facility] [place]  Patient name: Kerry Powell MRN: [id number] DOB: 07/18/1945 Sex: female   REQUESTING PROVIDER:    Dr. Radford Pax   REASON FOR CONSULT:    Thoracic aortic patjology  HISTORY OF PRESENT ILLNESS:   Kerry Powell is a 74 y.o. female, who I initially met last year for evaluation of PAD.  At that time the patient had a abdominal CT scan that showed a accessory right renal artery stenosis and an occluded left renal artery with an atrophic left kidney.  No intervention was recommended.  The patient was also dealing with leg swelling, for which I recommended compression stockings.  She was having a thoracic aortic aneurysm followed by [facility].  She recently had a CT scan which showed significant thoracic aortic pathology and so she is here today to discuss this with me.   The patient is medically managed for hypertension.  She takes a statin for hypercholesterolemia.  She is a diabetic.  Her most recent hemoglobin A1c is 7.5.  She has chronic renal insufficiency.  She has a history of stroke.   PAST MEDICAL HISTORY    Past Medical History:  Diagnosis Date  . Anxiety   . Arthritis   . BACK PAIN, CHRONIC 04/04/2007   Qualifier: Diagnosis of  By: Carlean Purl MD, Dimas Millin BIPOLAR AFFECTIVE DISORDER 04/04/2007   Qualifier: History of  By: Carlean Purl MD, Dimas Millin Cataract    removed both eyes  . Chronic neck pain 04/30/2015   Cervical spondylosis [facility] Intermittent numbness and tingling in occipital region and headaches S/p 2 occipital nerve blocks - only temp relief no relief with gabapentin, ultram, brace MRI neck - facet arthrosis   . CKD (chronic kidney disease) 04/26/2015  . COPD (chronic obstructive pulmonary disease) ([place])    pt denies - told this while was smoking -  . Cough 04/26/2015  . Depression 07/01/2017  . Diabetes ([place]) 04/27/2015   Diagnosed 03/2015   . Dizziness  06/10/2017  . Dysphagia 04/26/2015  . DYSPNEA 12/24/2007   Qualifier: Diagnosis of  By: Glennis Brink    . Elevated TSH 08/09/2015  . GASTRIC ULCER, ACUTE 04/07/2007   Annotation: EGD 04/07/07 Qualifier: Diagnosis of  By: Carlean Purl MD, FACG, [place] 04/07/2007   Annotation: EGD 04/07/07 Qualifier: Diagnosis of  By: Carlean Purl MD, Dimas Millin   . GERD (gastroesophageal reflux disease)   . H/O renal cell carcinoma 05/23/2017   Dx 2010, s/p cryoablation Follows with urology - Dr Lovena Neighbours  . HIATAL HERNIA 04/07/2007   Annotation: EGD 04/07/07 Qualifier: Diagnosis of  By: Carlean Purl MD, Dimas Millin History of CVA (cerebrovascular accident) 04/26/2015  . History of gout 04/26/2015  . Hyperlipidemia   . Hypertension   . Irritable bowel syndrome 04/04/2007   Qualifier: Diagnosis of  By: Carlean Purl MD, Dimas Millin Kidney tumor (benign)   . Nausea and vomiting 09/09/2017  . 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 OSTEOARTHRITIS 04/04/2007   Qualifier: Diagnosis of  By: Carlean Purl MD, Dimas Millin Personal history of colonic adenoma 06/07/2012  . POSTNASAL DRIP SYNDROME 12/29/2007   Qualifier: Diagnosis of  By: Chase Caller MD, [name]    . RESTLESS LEG SYNDROME 04/04/2007   Qualifier: Diagnosis of  By: Carlean Purl MD, Dimas Millin  RHINOSINUSITIS, 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   Qualifier: Diagnosis of  By: Carlean Purl MD, Dimas Millin Sleep difficulties 09/09/2017  . Snoring 04/26/2015  . Stroke [facility])    x2 2011  . Thoracic aortic aneurysm ([place]) 02/06/2018   Seen on MRI 01/25/18 of thoracic spine ordered by [facility] -- referred to CTS to monitor  -- 3.5 cm  . TIA (transient ischemic attack)   . TOBACCO ABUSE 12/17/2007   Qualifier: Diagnosis of  By: Chase Caller MD, [name]    . Trigeminal neuralgia of right side of face 02/08/2016  . URI (upper respiratory infection) 02/08/2016  . Vitamin D deficiency 03/12/2018     FAMILY HISTORY   Family History   Problem Relation Age of Onset  . Congestive Heart Failure Mother   . Cancer Mother        ovarian  . Heart disease Father   . Diabetes Father   . Kidney disease Father   . Diabetes Sister   . Kidney disease Sister   . Colon cancer Neg Hx   . Colon polyps Neg Hx   . Esophageal cancer Neg Hx   . Rectal cancer Neg Hx   . Stomach cancer Neg Hx     SOCIAL HISTORY:   Social History   Socioeconomic History  . Marital status: Married    Spouse name: Not on file  . Number of children: 2  . Years of education: Not on file  . Highest education level: Not on file  Occupational History  . Occupation: Retired  Tobacco Use  . Smoking status: Former Smoker    Quit date: 06/30/2009    Years since quitting: 9.8  . Smokeless tobacco: Never Used  Substance and Sexual Activity  . Alcohol use: No  . Drug use: No  . Sexual activity: Not on file  Other Topics Concern  . Not on file  Social History Narrative   Married, retired   1 son 1 daughter   2 caffeine/day   Social Determinants of Health   Financial Resource Strain:   . Difficulty of Paying Living Expenses: Not on file  Food Insecurity:   . Worried About Charity fundraiser in the Last Year: Not on file  . Ran Out of Food in the Last Year: Not on file  Transportation Needs:   . Lack of Transportation (Medical): Not on file  . Lack of Transportation (Non-Medical): Not on file  Physical Activity:   . Days of Exercise per Week: Not on file  . Minutes of Exercise per Session: Not on file  Stress:   . Feeling of Stress : Not on file  Social Connections:   . Frequency of Communication with Friends and Family: Not on file  . Frequency of Social Gatherings with Friends and Family: Not on file  . Attends Religious Services: Not on file  . Active Member of Clubs or Organizations: Not on file  . Attends Archivist Meetings: Not on file  . Marital Status: Not on file  Intimate Partner Violence:   . Fear of Current or  Ex-Partner: Not on file  . Emotionally Abused: Not on file  . Physically Abused: Not on file  . Sexually Abused: Not on file    ALLERGIES:    Allergies  Allergen Reactions  . Gabapentin Other (See Comments)    Fluid retention   . Clindamycin Rash  . Sulfa Antibiotics Nausea And Vomiting    CURRENT MEDICATIONS:    Current Outpatient  Medications  Medication Sig Dispense Refill  . allopurinol (ZYLOPRIM) 300 MG tablet TAKE 1 TABLET BY MOUTH EVERY DAY 90 tablet 0  . amLODipine (NORVASC) 5 MG tablet TAKE 1.5 TABLETS (7.5 MG TOTAL) BY MOUTH DAILY. 135 tablet 0  . amoxicillin-clavulanate (AUGMENTIN) 875-125 MG tablet Take 1 tablet by mouth 2 (two) times daily. 20 tablet 0  . cholecalciferol (VITAMIN D3) 25 MCG (1000 UNIT) tablet     . clopidogrel (PLAVIX) 75 MG tablet TAKE 1 TABLET BY MOUTH EVERY DAY 90 tablet 1  . cyclobenzaprine (FLEXERIL) 5 MG tablet Take 1 tablet (5 mg total) by mouth 3 (three) times daily as needed for muscle spasms. 30 tablet 1  . DULoxetine (CYMBALTA) 60 MG capsule TAKE 1 CAPSULE BY MOUTH EVERY DAY 90 capsule 1  . furosemide (LASIX) 20 MG tablet Take 20 mg by mouth. 3 times a week as needed for swelling    . glimepiride (AMARYL) 2 MG tablet TAKE 1 TABLET BY MOUTH DAILY BEFORE BREAKFAST 90 tablet 1  . metFORMIN (GLUCOPHAGE) 500 MG tablet Take 1 tablet (500 mg total) by mouth 2 (two) times daily with a meal. 180 tablet 3  . metoprolol succinate (TOPROL-XL) 25 MG 24 hr tablet TAKE 1 TABLET BY MOUTH EVERY DAY 90 tablet 1  . pantoprazole (PROTONIX) 40 MG tablet TAKE 1 TABLET BY MOUTH EVERY DAY 90 tablet 1  . rosuvastatin (CRESTOR) 10 MG tablet TAKE 1 TABLET BY MOUTH EVERY DAY 90 tablet 1  . sodium chloride (OCEAN) 0.65 % SOLN nasal spray Place 1 spray into both nostrils as needed for congestion. 60 mL 0  . telmisartan (MICARDIS) 80 MG tablet TAKE 1 TABLET BY MOUTH EVERY DAY (Patient taking differently: 40 mg. ) 90 tablet 1  . umeclidinium-vilanterol (ANORO ELLIPTA)  62.5-25 MCG/INH AEPB Inhale 1 puff into the lungs daily. 60 each 5   No current facility-administered medications for this visit.    REVIEW OF SYSTEMS:   [X]  denotes positive finding, [ ]  denotes negative finding Cardiac  Comments:  Chest pain or chest pressure:    Shortness of breath upon exertion: x   Short of breath when lying flat: x   Irregular heart rhythm:        Vascular    Pain in calf, thigh, or hip brought on by ambulation: x   Pain in feet at night that wakes you up from your sleep:     Blood clot in your veins:    Leg swelling:  x       Pulmonary    Oxygen at home:    Productive cough:     Wheezing:         Neurologic    Sudden weakness in arms or legs:     Sudden numbness in arms or legs:     Sudden onset of difficulty speaking or slurred speech:    Temporary loss of vision in one eye:     Problems with dizziness:         Gastrointestinal    Blood in stool:      Vomited blood:         Genitourinary    Burning when urinating:     Blood in urine:        Psychiatric    Major depression:         Hematologic    Bleeding problems:    Problems with blood clotting too easily:        Skin  Rashes or ulcers:        Constitutional    Fever or chills:     PHYSICAL EXAM:   Vitals:   04/27/19 1013  BP: 130/82  Pulse: 78  Resp: 20  Temp: 98.1 F (36.7 C)  SpO2: 94%  Weight: 235 lb (106.6 kg)  Height: 5' 5"  (1.651 m)    GENERAL: The patient is a well-nourished female, in no acute distress. The vital signs are documented above. CARDIAC: There is a regular rate and rhythm.  VASCULAR: Palpable pedal pulses bilaterally.  Trace bilateral edema. PULMONARY: Nonlabored respirations ABDOMEN: Soft and non-tender with normal pitched bowel sounds.  MUSCULOSKELETAL: There are no major deformities or cyanosis. NEUROLOGIC: No focal weakness or paresthesias are detected. SKIN: There are no ulcers or rashes noted. PSYCHIATRIC: The patient has a normal  affect.  STUDIES:   I have reviewed the following CTA: 1. Developing plaque-like region of probable pleuroparenchymal scarring at the left lung apex compared to relatively remote prior imaging from September of 2012. However, due to the change in appearance since the prior study and the patient's underlying emphysema and presumed smoking history, a developing malignancy such as a Pancoast tumor is difficult to exclude entirely. Recommend follow-up CT scan of the chest in 6-12 weeks to assess for stability. If there is evidence of interval change, then PET-CT would likely be warranted. 2. Extensive, irregular and ulcerated fibrofatty atherosclerotic plaque throughout the descending thoracic aorta with multifocal areas of small penetrating atherosclerotic ulcers. No evidence of aneurysm or dissection. Aortic Atherosclerosis (ICD10-170.0) 3. Coronary artery calcifications. 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  ASSESSMENT and PLAN   Renal: Known left renal artery occlusion.  The main right renal artery is widely patent.  No intervention has been recommended.  She does have difficult to control hypertension however I do not think that nephrectomy is warranted at least at this point.  Leg swelling: I again encouraged her to wear her compression stockings, which she has not been doing.  Thoracic aorta: The patient has extensive irregularities throughout her descending thoracic aorta.  There is no evidence of dissection or aneurysm.  I think this needs to be monitored over time for progression of disease.  At this time no intervention is recommended.  I have ordered a repeat CT angiogram for 1 year.   Leia Alf, MD, FACS [facility] [phone] Pager [phone]

## 2019-04-27 NOTE — Patient Instructions (Addendum)
  A CT scan was ordered - they will call you to schedule this.     You will see Dr Tamala Julian, the lung doctor 3/10.

## 2019-04-28 ENCOUNTER — Other Ambulatory Visit: Payer: Self-pay | Admitting: Internal Medicine

## 2019-05-01 ENCOUNTER — Ambulatory Visit: Payer: Medicare Other | Attending: Internal Medicine

## 2019-05-01 DIAGNOSIS — Z23 Encounter for immunization: Secondary | ICD-10-CM | POA: Insufficient documentation

## 2019-05-01 NOTE — Progress Notes (Signed)
   Covid-19 Vaccination Clinic  Name:  Kerry Powell    MRN: [id number] DOB: 17-Feb-1946  05/01/2019  [name] was observed post Covid-19 immunization for 15 minutes without incident. She was provided with Vaccine Information Sheet and instruction to access the V-Safe system.   [name] was instructed to call 911 with any severe reactions post vaccine: [name] Difficulty breathing  . Swelling of face and throat  . A fast heartbeat  . A bad rash all over body  . Dizziness and weakness

## 2019-05-06 ENCOUNTER — Other Ambulatory Visit: Payer: Self-pay

## 2019-05-06 ENCOUNTER — Ambulatory Visit: Payer: Medicare Other | Admitting: Internal Medicine

## 2019-05-06 ENCOUNTER — Encounter: Payer: Self-pay | Admitting: Internal Medicine

## 2019-05-06 ENCOUNTER — Telehealth: Payer: Self-pay | Admitting: Internal Medicine

## 2019-05-06 VITALS — BP 136/82 | HR 92 | Temp 98.0°F | Ht 65.0 in | Wt 236.8 lb

## 2019-05-06 DIAGNOSIS — M542 Cervicalgia: Secondary | ICD-10-CM | POA: Diagnosis not present

## 2019-05-06 DIAGNOSIS — J948 Other specified pleural conditions: Secondary | ICD-10-CM

## 2019-05-06 DIAGNOSIS — M5030 Other cervical disc degeneration, unspecified cervical region: Secondary | ICD-10-CM | POA: Diagnosis not present

## 2019-05-06 MED ORDER — CETIRIZINE HCL 10 MG PO TABS: 10.0000 mg | ORAL_TABLET | Freq: Every day | ORAL | 11 refills | Status: DC

## 2019-05-06 MED ORDER — FLUTICASONE PROPIONATE 50 MCG/ACT NA SUSP: 2.0000 | Freq: Every day | NASAL | 11 refills | Status: DC

## 2019-05-06 MED ORDER — MONTELUKAST SODIUM 10 MG PO TABS: 10.0000 mg | ORAL_TABLET | Freq: Every day | ORAL | 11 refills | Status: DC

## 2019-05-06 MED ORDER — IPRATROPIUM-ALBUTEROL 0.5-2.5 (3) MG/3ML IN SOLN: 3.0000 mL | Freq: Four times a day (QID) | RESPIRATORY_TRACT | 5 refills | Status: DC

## 2019-05-06 NOTE — Progress Notes (Signed)
Synopsis: Dyspnea, DJD, abnormal CT chest  Assessment & Plan:  Problem 1 apical pleuroparenchymal scarring: looks pretty benign to me.  I guess will be ultra-conservative and get repeat CT in 3 months.  Problem 2 cervical DJD, back spasms- has pretty significant disease on last MRI, as symptoms are progressing will check another Problem 3 DOE- more related to when she gets back spasms (odd pain characteristic, almost sounds like nerve entrapment), probably has some baseline symptomatic COPD but difficult to tease out.  Anoro powder is not tolerated well. Problem 4 Sinus drainage, postnasal drip- should try treating this too as probably not helping sensation of dyspnea Problem 5 muscular deconditioning Problem 6 thoracic aneurysm- annual imaging per vascular surgery  - Duonebs instead of anoro - MRI C spine, potential re-referral to ortho vs. NSGY - Flonase, singulair - Continue humidifier at home - Repeat CT chest May 2021 then can be annual (needs this anyway for thoracic aneurysm monitoring) - RTC after CT chest  MDM . I reviewed prior external note(s) from Dr. Quay Burow 04/27/19 . I reviewed the result(s) of MRI C spine 10/08/16 . I have ordered meds and imaging above  Review of patient's 2/22 CT chest images revealed tortuous aorta with lots of plaques, bi-apical pleural changes that look benign. The patient's images have been independently reviewed by me.     End of visit medications:  Current Outpatient Medications:  .  allopurinol (ZYLOPRIM) 300 MG tablet, TAKE 1 TABLET BY MOUTH EVERY DAY, Disp: 90 tablet, Rfl: 0 .  amLODipine (NORVASC) 5 MG tablet, TAKE 1.5 TABLETS (7.5 MG TOTAL) BY MOUTH DAILY., Disp: 135 tablet, Rfl: 0 .  cholecalciferol (VITAMIN D3) 25 MCG (1000 UNIT) tablet, , Disp: , Rfl:  .  clopidogrel (PLAVIX) 75 MG tablet, TAKE 1 TABLET BY MOUTH EVERY DAY, Disp: 90 tablet, Rfl: 1 .  cyclobenzaprine (FLEXERIL) 5 MG tablet, Take 1 tablet (5 mg total) by mouth 3 (three)  times daily as needed for muscle spasms., Disp: 30 tablet, Rfl: 1 .  DULoxetine (CYMBALTA) 60 MG capsule, TAKE 1 CAPSULE BY MOUTH EVERY DAY, Disp: 90 capsule, Rfl: 1 .  furosemide (LASIX) 20 MG tablet, Take 20 mg by mouth. 3 times a week as needed for swelling, Disp: , Rfl:  .  metFORMIN (GLUCOPHAGE) 500 MG tablet, Take 1 tablet (500 mg total) by mouth 2 (two) times daily with a meal., Disp: 180 tablet, Rfl: 3 .  metoprolol succinate (TOPROL-XL) 25 MG 24 hr tablet, TAKE 1 TABLET BY MOUTH EVERY DAY, Disp: 90 tablet, Rfl: 1 .  pantoprazole (PROTONIX) 40 MG tablet, TAKE 1 TABLET BY MOUTH EVERY DAY, Disp: 90 tablet, Rfl: 1 .  rosuvastatin (CRESTOR) 10 MG tablet, TAKE 1 TABLET BY MOUTH EVERY DAY, Disp: 90 tablet, Rfl: 1 .  sodium chloride (OCEAN) 0.65 % SOLN nasal spray, Place 1 spray into both nostrils as needed for congestion., Disp: 60 mL, Rfl: 0 .  telmisartan (MICARDIS) 80 MG tablet, TAKE 1 TABLET BY MOUTH EVERY DAY (Patient taking differently: 40 mg. ), Disp: 90 tablet, Rfl: 1 .  umeclidinium-vilanterol (ANORO ELLIPTA) 62.5-25 MCG/INH AEPB, Inhale 1 puff into the lungs daily., Disp: 60 each, Rfl: 5 .  cetirizine (ZYRTEC ALLERGY) 10 MG tablet, Take 1 tablet (10 mg total) by mouth daily., Disp: 30 tablet, Rfl: 11 .  fluticasone (FLONASE) 50 MCG/ACT nasal spray, Place 2 sprays into both nostrils daily., Disp: 11.1 mL, Rfl: 11 .  montelukast (SINGULAIR) 10 MG tablet, Take 1 tablet (10  mg total) by mouth at bedtime., Disp: 30 tablet, Rfl: [place], MD [facility] 05/06/2019 3:07 PM    Subjective:   PATIENT ID: Kerry Powell GENDER: female DOB: Apr 11, 1945, MRN: [id number]  Chief Complaint  Patient presents with  . Pulmonary Consult    referred by Dr Billey Gosling for evaluation of shortness of breath    HPI Myriad of issues:  (1) Episodic dyspnea - Brought on by severe pain between her scapula making breathing hurt - Relieved with stretching and rest, worse  with activity - No changes with inhaler use when this happens  (2) DOE, more of a chronic issue - Not as much of a bother - MMRC 1  (3) Anoro intolerance - Makes her mouth extremely dry and irritated  (4) Constant sinus drainage, congestion, and postnasal drip - Unclear workup and treatment to date  (5) Weakness and lightning-like feelings in hands, worsening over time, combined with her scapular pain, she wonders if her C spine DJD worsening  (6) Strange sensation in legs that worsens with standing: "like fluid is draining down my legs when I stand" causing her to need to stand for a few seconds before being able to do anything.  Ancillary information including prior medications, full medical/surgical/family/social histoies, and PFTs (when available) are listed below and have been reviewed.   ROS + symptoms in bold Fevers, chills, weight loss Nausea, vomiting, diarrhea Shortness of breath, wheezing, cough Chest pain, palpitations, lower ext edema   Objective:   Vitals:   05/06/19 1430  BP: 136/82  Pulse: 92  Temp: 98 F (36.7 C)  TempSrc: Temporal  SpO2: 94%  Weight: 236 lb 12.8 oz (107.4 kg)  Height: _0  (1.651 m)   94% on RA BMI Readings from Last 3 Encounters:  05/06/19 39.41 kg/m  04/27/19 39.11 kg/m  04/27/19 39.11 kg/m   Wt Readings from Last 3 Encounters:  05/06/19 236 lb 12.8 oz (107.4 kg)  04/27/19 235 lb (106.6 kg)  04/27/19 235 lb (106.6 kg)    GEN: kyphotic woman in no acute distress HEENT: trachea midline, mucus membranes dry and irritated CV: Regular rate and rhythm, +murmur, extremities are warm PULM: clear, no wheezing GI: Soft, +BS EXT: no edema, has thenar and hypothenar wasting bilaterally NEURO: Moves all 4 extremities   Ancillary Information    Past Medical History:  Diagnosis Date  . Anxiety   . Arthritis   . BACK PAIN, CHRONIC 04/04/2007   Qualifier: Diagnosis of  By: Carlean Purl MD, Dimas Millin BIPOLAR AFFECTIVE  DISORDER 04/04/2007   Qualifier: History of  By: Carlean Purl MD, Dimas Millin Cataract    removed both eyes  . Chronic neck pain 04/30/2015   Cervical spondylosis [facility] Intermittent numbness and tingling in occipital region and headaches S/p 2 occipital nerve blocks - only temp relief no relief with gabapentin, ultram, brace MRI neck - facet arthrosis   . CKD (chronic kidney disease) 04/26/2015  . COPD (chronic obstructive pulmonary disease) ([place])    pt denies - told this while was smoking -  . Cough 04/26/2015  . Depression 07/01/2017  . Diabetes ([place]) 04/27/2015   Diagnosed 03/2015   . Dizziness 06/10/2017  . Dysphagia 04/26/2015  . DYSPNEA 12/24/2007   Qualifier: Diagnosis of  By: Glennis Brink    . Elevated TSH 08/09/2015  . GASTRIC ULCER, ACUTE 04/07/2007   Annotation: EGD 04/07/07 Qualifier: Diagnosis of  By: Carlean Purl MD, FACG, [place] 04/07/2007   Annotation: EGD 04/07/07 Qualifier: Diagnosis of  By: Carlean Purl MD, Dimas Millin GERD (gastroesophageal reflux disease)   . H/O renal cell carcinoma 05/23/2017   Dx 2010, s/p cryoablation Follows with urology - Dr Lovena Neighbours  . HIATAL HERNIA 04/07/2007   Annotation: EGD 04/07/07 Qualifier: Diagnosis of  By: Carlean Purl MD, Dimas Millin History of CVA (cerebrovascular accident) 04/26/2015  . History of gout 04/26/2015  . Hyperlipidemia   . Hypertension   . Irritable bowel syndrome 04/04/2007   Qualifier: Diagnosis of  By: Carlean Purl MD, Dimas Millin Kidney tumor (benign)   . Nausea and vomiting 09/09/2017  . Occipital neuralgia 05/09/2016   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 Dr Dimas Millin in [place]  . OSTEOARTHRITIS 04/04/2007   Qualifier: Diagnosis of  By: Carlean Purl MD, Dimas Millin Personal history of colonic adenoma 06/07/2012  . POSTNASAL DRIP SYNDROME 12/29/2007   Qualifier: Diagnosis of  By: Chase Caller MD, [name]    . RESTLESS LEG SYNDROME 04/04/2007   Qualifier: Diagnosis of  By: Carlean Purl MD, FACG, [place], 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   Qualifier: Diagnosis of  By: Carlean Purl MD, Dimas Millin Sleep difficulties 09/09/2017  . Snoring 04/26/2015  . Stroke [facility])    x2 2011  . Thoracic aortic aneurysm ([place]) 02/06/2018   Seen on MRI 01/25/18 of thoracic spine ordered by [facility] -- referred to CTS to monitor  -- 3.5 cm  . TIA (transient ischemic attack)   . TOBACCO ABUSE 12/17/2007   Qualifier: Diagnosis of  By: Chase Caller MD, [name]    . Trigeminal neuralgia of right side of face 02/08/2016  . URI (upper respiratory infection) 02/08/2016  . Vitamin D deficiency 03/12/2018     Family History  Problem Relation Age of Onset  . Congestive Heart Failure Mother   . Cancer Mother        ovarian  . Heart disease Father   . Diabetes Father   . Kidney disease Father   . Diabetes Sister   . Kidney disease Sister   . Colon cancer Neg Hx   . Colon polyps Neg Hx   . Esophageal cancer Neg Hx   . Rectal cancer Neg Hx   . Stomach cancer Neg Hx      Past Surgical History:  Procedure Laterality Date  . ABDOMINAL HYSTERECTOMY    . CATARACT EXTRACTION    . COLONOSCOPY    . CYSTOCELE REPAIR    . ESOPHAGOGASTRODUODENOSCOPY    . KIDNEY SURGERY     benign tumor removal  . ORTHOPEDIC SURGERY     Wrist & elbow  . POLYPECTOMY    . RECTOCELE REPAIR    . UPPER GASTROINTESTINAL ENDOSCOPY      Social History   Socioeconomic History  . Marital status: Married    Spouse name: Not on file  . Number of children: 2  . Years of education: Not on file  . Highest education level: Not on file  Occupational History  . Occupation: Retired  Tobacco Use  . Smoking status: Former Smoker    Packs/day: 1.00    Years: 46.00    Pack years: 46.00    Quit date: 06/30/2009    Years since quitting: 9.8  . Smokeless tobacco: Never Used  Substance and Sexual Activity  . Alcohol use: No  . Drug  use: No  . Sexual activity: Not on file  Other Topics Concern  . Not on file  Social History Narrative    Married, retired   1 son 1 daughter   2 caffeine/day   Social Determinants of Health   Financial Resource Strain:   . Difficulty of Paying Living Expenses: Not on file  Food Insecurity:   . Worried About Charity fundraiser in the Last Year: Not on file  . Ran Out of Food in the Last Year: Not on file  Transportation Needs:   . Lack of Transportation (Medical): Not on file  . Lack of Transportation (Non-Medical): Not on file  Physical Activity:   . Days of Exercise per Week: Not on file  . Minutes of Exercise per Session: Not on file  Stress:   . Feeling of Stress : Not on file  Social Connections:   . Frequency of Communication with Friends and Family: Not on file  . Frequency of Social Gatherings with Friends and Family: Not on file  . Attends Religious Services: Not on file  . Active Member of Clubs or Organizations: Not on file  . Attends Archivist Meetings: Not on file  . Marital Status: Not on file  Intimate Partner Violence:   . Fear of Current or Ex-Partner: Not on file  . Emotionally Abused: Not on file  . Physically Abused: Not on file  . Sexually Abused: Not on file     Allergies  Allergen Reactions  . Gabapentin Other (See Comments)    Fluid retention   . Clindamycin Rash  . Sulfa Antibiotics Nausea And Vomiting     CBC    Component Value Date/Time   WBC 13.6 (H) 03/30/2019 1504   RBC 4.20 03/30/2019 1504   HGB 11.1 (L) 03/30/2019 1504   HCT 35.8 (L) 03/30/2019 1504   PLT 368.0 03/30/2019 1504   MCV 85.2 03/30/2019 1504   MCH 30.5 05/13/2014 1520   MCHC 31.1 03/30/2019 1504   RDW 16.3 (H) 03/30/2019 1504   LYMPHSABS 2.0 03/30/2019 1504   MONOABS 0.8 03/30/2019 1504   EOSABS 0.0 03/30/2019 1504   BASOSABS 0.1 03/30/2019 1504    Pulmonary Functions Testing Results: No flowsheet data found.  Outpatient Medications Prior to Visit  Medication Sig Dispense Refill  . allopurinol (ZYLOPRIM) 300 MG tablet TAKE 1 TABLET BY MOUTH EVERY DAY  90 tablet 0  . amLODipine (NORVASC) 5 MG tablet TAKE 1.5 TABLETS (7.5 MG TOTAL) BY MOUTH DAILY. 135 tablet 0  . cholecalciferol (VITAMIN D3) 25 MCG (1000 UNIT) tablet     . clopidogrel (PLAVIX) 75 MG tablet TAKE 1 TABLET BY MOUTH EVERY DAY 90 tablet 1  . cyclobenzaprine (FLEXERIL) 5 MG tablet Take 1 tablet (5 mg total) by mouth 3 (three) times daily as needed for muscle spasms. 30 tablet 1  . DULoxetine (CYMBALTA) 60 MG capsule TAKE 1 CAPSULE BY MOUTH EVERY DAY 90 capsule 1  . furosemide (LASIX) 20 MG tablet Take 20 mg by mouth. 3 times a week as needed for swelling    . metFORMIN (GLUCOPHAGE) 500 MG tablet Take 1 tablet (500 mg total) by mouth 2 (two) times daily with a meal. 180 tablet 3  . metoprolol succinate (TOPROL-XL) 25 MG 24 hr tablet TAKE 1 TABLET BY MOUTH EVERY DAY 90 tablet 1  . pantoprazole (PROTONIX) 40 MG tablet TAKE 1 TABLET BY MOUTH EVERY DAY 90 tablet 1  . rosuvastatin (CRESTOR) 10 MG tablet TAKE 1  TABLET BY MOUTH EVERY DAY 90 tablet 1  . sodium chloride (OCEAN) 0.65 % SOLN nasal spray Place 1 spray into both nostrils as needed for congestion. 60 mL 0  . telmisartan (MICARDIS) 80 MG tablet TAKE 1 TABLET BY MOUTH EVERY DAY (Patient taking differently: 40 mg. ) 90 tablet 1  . umeclidinium-vilanterol (ANORO ELLIPTA) 62.5-25 MCG/INH AEPB Inhale 1 puff into the lungs daily. 60 each 5  . glimepiride (AMARYL) 2 MG tablet TAKE 1 TABLET BY MOUTH DAILY BEFORE BREAKFAST 90 tablet 1   No facility-administered medications prior to visit.

## 2019-05-06 NOTE — Telephone Encounter (Signed)
Spoke with patient. She stated that her insurance will not cover the RX for Zyrtec. I advised her that she will probably have the get the medication OTC. Advised her the cheapest place to get the Zyrtec will be at one of the [facility] outpatient pharmacies for $4. She verbalized understanding.   While on the phone, she stated that Dr. Tamala Julian may refer her to an orthopedics doctor depending on her MRI results. She would like to be referred to Dr. Susa Day at [facility]. Advised her that I would let Dr. Tamala Julian know.

## 2019-05-06 NOTE — Patient Instructions (Addendum)
-   I think the changes on your CT are not going to be anything serious - Stop anoro - Start duonebs 4x daily - Keep using humidifier - Start flonase, singulair for your sinus drainage - MRI c spine and potential orthopedics referral - CT Chest in May then follow up with me

## 2019-05-07 NOTE — Telephone Encounter (Signed)
Thanks, will keep an eye out for MRI.

## 2019-05-08 ENCOUNTER — Telehealth: Payer: Self-pay | Admitting: Internal Medicine

## 2019-05-08 DIAGNOSIS — J948 Other specified pleural conditions: Secondary | ICD-10-CM

## 2019-05-08 MED ORDER — IPRATROPIUM-ALBUTEROL 0.5-2.5 (3) MG/3ML IN SOLN: 3.0000 mL | Freq: Four times a day (QID) | RESPIRATORY_TRACT | 5 refills | Status: DC

## 2019-05-08 NOTE — Telephone Encounter (Signed)
Spoke with pt, she states [facility] didn't receive her Rx for the nebulizer medication (duoneb). I sent the medication to the pharmacy and nothing further is needed.

## 2019-05-11 ENCOUNTER — Telehealth: Payer: Self-pay | Admitting: Internal Medicine

## 2019-05-11 NOTE — Telephone Encounter (Signed)
Patient states suppose to get the Nebulizer machine this afternoon 05/11/2019.  [facility] is sending her the Nebulizer medication.  Patient phone number is [phone].

## 2019-05-11 NOTE — Telephone Encounter (Signed)
Called and spoke with pt in regards to message received. Pt stated she received a call stating that her nebulizer should be delivered today and has already contacted about her medication too. Nothing further needed.

## 2019-05-26 ENCOUNTER — Encounter: Payer: Self-pay | Admitting: Cardiology

## 2019-05-26 ENCOUNTER — Ambulatory Visit: Payer: Medicare Other | Admitting: Cardiology

## 2019-05-26 ENCOUNTER — Other Ambulatory Visit: Payer: Self-pay

## 2019-05-26 VITALS — BP 142/82 | HR 88 | Ht 65.5 in | Wt 235.8 lb

## 2019-05-26 DIAGNOSIS — I739 Peripheral vascular disease, unspecified: Secondary | ICD-10-CM | POA: Diagnosis not present

## 2019-05-26 DIAGNOSIS — R0602 Shortness of breath: Secondary | ICD-10-CM | POA: Diagnosis not present

## 2019-05-26 DIAGNOSIS — R079 Chest pain, unspecified: Secondary | ICD-10-CM | POA: Diagnosis not present

## 2019-05-26 DIAGNOSIS — E785 Hyperlipidemia, unspecified: Secondary | ICD-10-CM | POA: Diagnosis not present

## 2019-05-26 MED ORDER — METOPROLOL TARTRATE 100 MG PO TABS: ORAL_TABLET | ORAL | 0 refills | Status: DC

## 2019-05-26 MED ORDER — ROSUVASTATIN CALCIUM 20 MG PO TABS: 20.0000 mg | ORAL_TABLET | Freq: Every day | ORAL | 3 refills | Status: DC

## 2019-05-26 NOTE — Patient Instructions (Addendum)
Medication Instructions:  Your physician has recommended you make the following change in your medication:  1-INCREASE Crestor 20 mg by mouth daily  *If you need a refill on your cardiac medications before your next appointment, please call your pharmacy*  Lab Work: Your physician recommends that you return for lab work in: 6 weeks for ALT and fasting lipid and liver panel  Your physician recommends that you return for lab work before your test BMET If you have labs (blood work) drawn today and your tests are completely normal, you will receive your results only by: [name] MyChart Message (if you have MyChart) OR . A paper copy in the mail If you have any lab test that is abnormal or we need to change your treatment, we will call you to review the results.  Testing/Procedures: Your physician has requested that you have cardiac CT. Cardiac computed tomography (CT) is a painless test that uses an x-ray machine to take clear, detailed pictures of your heart. For further information please visit [email or web address]. Please follow instruction sheet as given.  Your physician has requested that you have a lower or upper extremity arterial duplex. This test is an ultrasound of the arteries in the legs or arms. It looks at arterial blood flow in the legs and arms. Allow one hour for Lower and Upper Arterial scans. There are no restrictions or special instructions.  Follow-Up: At [facility], you and your health needs are our priority.  As part of our continuing mission to provide you with exceptional heart care, we have created designated Provider Care Teams.  These Care Teams include your primary Cardiologist (physician) and Advanced Practice Providers (APPs -  Physician Assistants and Nurse Practitioners) who all work together to provide you with the care you need, when you need it.  We recommend signing up for the patient portal called "MyChart".  Sign up information is provided on this After Visit  Summary.  MyChart is used to connect with patients for Virtual Visits (Telemedicine).  Patients are able to view lab/test results, encounter notes, upcoming appointments, etc.  Non-urgent messages can be sent to your provider as well.   To learn more about what you can do with MyChart, go to [email or web address].    Your next appointment:   1 year(s)  The format for your next appointment:   Either In Person or Virtual  Provider:   You may see Fransico Him, MD or one of the following Advanced Practice Providers on your designated Care Team:    Melina Copa, PA-C  Ermalinda Barrios, PA-C   Your cardiac CT will be scheduled at one of the below locations:   [facility] [address] [place], [place] [address] [phone]  If scheduled at [facility], please arrive at the [facility] main entrance of [facility] 30 minutes prior to test start time. Proceed to the [facility] Radiology Department (first floor) to check-in and test prep.  Please follow these instructions carefully (unless otherwise directed):  On the Night Before the Test: . Be sure to Drink plenty of water. . Do not consume any caffeinated/decaffeinated beverages or chocolate 12 hours prior to your test. . Do not take any antihistamines 12 hours prior to your test. . If you take Metformin do not take 24 hours prior to test.  On the Day of the Test: . Drink plenty of water. Do not drink any water within one hour of the test. . Do not eat any food  4 hours prior to the test. . You may take your regular medications prior to the test.  . Take metoprolol (Lopressor) 100 mg two hours prior to test. . HOLD Furosemide morning of the test. . FEMALES- please wear underwire-free bra if available      After the Test: . Drink plenty of water. . After receiving IV contrast, you may experience a mild flushed feeling. This is normal. . On occasion, you may experience a mild rash up to 24 hours after  the test. This is not dangerous. If this occurs, you can take Benadryl 25 mg and increase your fluid intake. . If you experience trouble breathing, this can be serious. If it is severe call 911 IMMEDIATELY. If it is mild, please call our office. . If you take any of these medications: Metformin, please do not take 48 hours after completing test unless otherwise instructed.   Once we have confirmed authorization from your insurance company, we will call you to set up a date and time for your test.   For non-scheduling related questions, please contact the cardiac imaging nurse navigator should you have any questions/concerns: Marchia Bond, RN Navigator Cardiac Imaging [facility] [phone] office  For scheduling needs, including cancellations and rescheduling, please call [phone].

## 2019-05-26 NOTE — Addendum Note (Signed)
Addended by: Aris Georgia, Eliasar Hlavaty L on: 05/26/2019 03:48 PM   Modules accepted: Orders

## 2019-05-26 NOTE — Progress Notes (Signed)
Cardiology Office Note:    Date:  05/26/2019   ID:  CHRISTI HEELAN, DOB 30-Sep-1945, MRN [id number]  PCP:  Binnie Rail, MD  Cardiologist:  Fransico Him, MD    Referring MD: Binnie Rail, MD   Chief Complaint  Patient presents with  . Shortness of Breath    History of Present Illness:    Kerry Powell is a 74 y.o. female with a hx of Bipolar disorder, CKD, DOPD, DM2, HTN, HLD, GERD, CVA and aortic aneurysm.  She was first seen by me a year ago for evaluation of thoracic descending aortic aneurysm that was 3.5cm.  Chest CTA was done showing occluded left renal artery, high grade stenosis of the superior right renal artery, extensive atheromatous plaque and nonocclusive mural thrombus in the distal descending thoracic and abdominal aorta without aneurysm or stenosis and scattered plaque in the iliac arterial systems with no stenosis.   She underwent repeat Chest CTA 03/2019 showing developing plaque-like region of probable pleuroparenchymal scarring at the left lung apex compared to relatively remote prior imaging from September of 2012. However, due to the change in appearance since the prior study and the patient's underlying emphysema and presumed smoking history, a developing malignancy such as a Pancoast tumor was difficult to exclude entirely. There was extensive, irregular and ulcerated fibrofatty atherosclerotic plaque throughout the descending thoracic aorta with multifocal areas of small penetrating atherosclerotic ulcers. No evidence of aneurysm or dissection. There were also coronary calcifications noted. She is followed yearly by Dr. Arita Miss for her PVD.  It was recommended that we repeat Chest CT in 6-10 weeks for followup.    She was seen by Pulmonary for SOB and abnormal chest CT.  Dr. Tamala Julian reviewed Chest CT and felt it was likely benign but agreed with followup CT in 3 months and that has been ordered by PCp and pulmonary who will be following up on it.  She is still having  difficulty with DOE that she tells me has gotten worse.  She is taking some inhalers but not really helping.  She is also taking flonase and singulair.    She tells me today that recently she has been getting spells where all of a sudden she will have a severe squeezing sensation between her shoulder blades in her back and at the same time she will have severe SOB. Her pulmonologist felt it could be related to back spasm with nerve entrapment and she had a CT of her cervical spine ordered in the near future.  She also tells me that she has been having a lot of problems with LE pain when she stands or walks.    She denies any  PND, orthopnea, LE edema, dizziness, palpitations or syncope. She is compliant with her meds and is tolerating meds with no SE.    Past Medical History:  Diagnosis Date  . Anxiety   . Arthritis   . BACK PAIN, CHRONIC 04/04/2007   Qualifier: Diagnosis of  By: Carlean Purl MD, Dimas Millin BIPOLAR AFFECTIVE DISORDER 04/04/2007   Qualifier: History of  By: Carlean Purl MD, Dimas Millin Cataract    removed both eyes  . Chronic neck pain 04/30/2015   Cervical spondylosis [facility] Intermittent numbness and tingling in occipital region and headaches S/p 2 occipital nerve blocks - only temp relief no relief with gabapentin, ultram, brace MRI neck - facet arthrosis   . CKD (chronic kidney disease) 04/26/2015  . COPD (  chronic obstructive pulmonary disease) ([place])    pt denies - told this while was smoking -  . Cough 04/26/2015  . Depression 07/01/2017  . Diabetes ([place]) 04/27/2015   Diagnosed 03/2015   . Dizziness 06/10/2017  . Dysphagia 04/26/2015  . DYSPNEA 12/24/2007   Qualifier: Diagnosis of  By: Glennis Brink    . Elevated TSH 08/09/2015  . GASTRIC ULCER, ACUTE 04/07/2007   Annotation: EGD 04/07/07 Qualifier: Diagnosis of  By: Carlean Purl MD, FACG, [place] 04/07/2007   Annotation: EGD 04/07/07 Qualifier: Diagnosis of  By: Carlean Purl MD, Dimas Millin   . GERD  (gastroesophageal reflux disease)   . H/O renal cell carcinoma 05/23/2017   Dx 2010, s/p cryoablation Follows with urology - Dr Lovena Neighbours  . HIATAL HERNIA 04/07/2007   Annotation: EGD 04/07/07 Qualifier: Diagnosis of  By: Carlean Purl MD, Dimas Millin History of CVA (cerebrovascular accident) 04/26/2015  . History of gout 04/26/2015  . Hyperlipidemia   . Hypertension   . Irritable bowel syndrome 04/04/2007   Qualifier: Diagnosis of  By: Carlean Purl MD, Dimas Millin Kidney tumor (benign)   . Nausea and vomiting 09/09/2017  . 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 OSTEOARTHRITIS 04/04/2007   Qualifier: Diagnosis of  By: Carlean Purl MD, Dimas Millin Personal history of colonic adenoma 06/07/2012  . POSTNASAL DRIP SYNDROME 12/29/2007   Qualifier: Diagnosis of  By: Chase Caller MD, [name]    . RESTLESS LEG SYNDROME 04/04/2007   Qualifier: Diagnosis of  By: Carlean Purl MD, FACG, [employer], 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   Qualifier: Diagnosis of  By: Carlean Purl MD, Dimas Millin Sleep difficulties 09/09/2017  . Snoring 04/26/2015  . Stroke [facility])    x2 2011  . Thoracic aortic aneurysm ([place]) 02/06/2018   Seen on MRI 01/25/18 of thoracic spine ordered by [facility] -- referred to CTS to monitor  -- 3.5 cm  . TIA (transient ischemic attack)   . TOBACCO ABUSE 12/17/2007   Qualifier: Diagnosis of  By: Chase Caller MD, [name]    . Trigeminal neuralgia of right side of face 02/08/2016  . URI (upper respiratory infection) 02/08/2016  . Vitamin D deficiency 03/12/2018    Past Surgical History:  Procedure Laterality Date  . ABDOMINAL HYSTERECTOMY    . CATARACT EXTRACTION    . COLONOSCOPY    . CYSTOCELE REPAIR    . ESOPHAGOGASTRODUODENOSCOPY    . KIDNEY SURGERY     benign tumor removal  . ORTHOPEDIC SURGERY     Wrist & elbow  . POLYPECTOMY    . RECTOCELE REPAIR    . UPPER GASTROINTESTINAL ENDOSCOPY      Current Medications: Current Meds  Medication Sig   . allopurinol (ZYLOPRIM) 300 MG tablet TAKE 1 TABLET BY MOUTH EVERY DAY  . amLODipine (NORVASC) 5 MG tablet TAKE 1.5 TABLETS (7.5 MG TOTAL) BY MOUTH DAILY.  . cetirizine (ZYRTEC ALLERGY) 10 MG tablet Take 1 tablet (10 mg total) by mouth daily.  . cholecalciferol (VITAMIN D3) 25 MCG (1000 UNIT) tablet   . clopidogrel (PLAVIX) 75 MG tablet TAKE 1 TABLET BY MOUTH EVERY DAY  . cyclobenzaprine (FLEXERIL) 5 MG tablet Take 1 tablet (5 mg total) by mouth 3 (three) times daily as needed for muscle spasms.  . DULoxetine (CYMBALTA) 60 MG capsule TAKE 1 CAPSULE BY MOUTH EVERY DAY  . fluticasone (  FLONASE) 50 MCG/ACT nasal spray Place 2 sprays into both nostrils daily.  . furosemide (LASIX) 20 MG tablet Take 20 mg by mouth. 3 times a week as needed for swelling  . ipratropium-albuterol (DUONEB) 0.5-2.5 (3) MG/3ML SOLN Take 3 mLs by nebulization 4 (four) times daily.  . metFORMIN (GLUCOPHAGE) 500 MG tablet Take 1 tablet (500 mg total) by mouth 2 (two) times daily with a meal.  . metoprolol succinate (TOPROL-XL) 25 MG 24 hr tablet TAKE 1 TABLET BY MOUTH EVERY DAY  . montelukast (SINGULAIR) 10 MG tablet Take 1 tablet (10 mg total) by mouth at bedtime.  . pantoprazole (PROTONIX) 40 MG tablet TAKE 1 TABLET BY MOUTH EVERY DAY  . rosuvastatin (CRESTOR) 10 MG tablet TAKE 1 TABLET BY MOUTH EVERY DAY  . sodium chloride (OCEAN) 0.65 % SOLN nasal spray Place 1 spray into both nostrils as needed for congestion.  [name] telmisartan (MICARDIS) 80 MG tablet TAKE 1 TABLET BY MOUTH EVERY DAY (Patient taking differently: 40 mg. )  . umeclidinium-vilanterol (ANORO ELLIPTA) 62.5-25 MCG/INH AEPB Inhale 1 puff into the lungs daily.     Allergies:   Gabapentin, Clindamycin, and Sulfa antibiotics   Social History   Socioeconomic History  . Marital status: Married    Spouse name: Not on file  . Number of children: 2  . Years of education: Not on file  . Highest education level: Not on file  Occupational History  . Occupation:  Retired  Tobacco Use  . Smoking status: Former Smoker    Packs/day: 1.00    Years: 46.00    Pack years: 46.00    Quit date: 06/30/2009    Years since quitting: 9.9  . Smokeless tobacco: Never Used  Substance and Sexual Activity  . Alcohol use: No  . Drug use: No  . Sexual activity: Not on file  Other Topics Concern  . Not on file  Social History Narrative   Married, retired   1 son 1 daughter   2 caffeine/day   Social Determinants of Radio broadcast assistant Strain:   . Difficulty of Paying Living Expenses:   Food Insecurity:   . Worried About Charity fundraiser in the Last Year:   . Arboriculturist in the Last Year:   Transportation Needs:   . Film/video editor (Medical):   [name] Lack of Transportation (Non-Medical):   Physical Activity:   . Days of Exercise per Week:   . Minutes of Exercise per Session:   Stress:   . Feeling of Stress :   Social Connections:   . Frequency of Communication with Friends and Family:   . Frequency of Social Gatherings with Friends and Family:   . Attends Religious Services:   . Active Member of Clubs or Organizations:   . Attends Archivist Meetings:   [name] Marital Status:      Family History: The patient's family history includes Cancer in her mother; Congestive Heart Failure in her mother; Diabetes in her father and sister; Heart disease in her father; Kidney disease in her father and sister. There is no history of Colon cancer, Colon polyps, Esophageal cancer, Rectal cancer, or Stomach cancer.  ROS:   Please see the history of present illness.    ROS  All other systems reviewed and negative.   EKGs/Labs/Other Studies Reviewed:    The following studies were reviewed today: Chest CT, Abdominal CT  EKG:  EKG is no ordered today.   Recent Labs:  03/30/2019: ALT 13; BUN 25; Creatinine, Ser 1.39; Hemoglobin 11.1; Platelets 368.0; Potassium 4.4; Pro B Natriuretic peptide (BNP) 238.0; Sodium 137   Recent Lipid Panel     Component Value Date/Time   CHOL 155 03/30/2019 1504   TRIG 107.0 03/30/2019 1504   HDL 44.80 03/30/2019 1504   CHOLHDL 3 03/30/2019 1504   VLDL 21.4 03/30/2019 1504   LDLCALC 89 03/30/2019 1504   LDLDIRECT 92.0 03/12/2018 1407    Physical Exam:    VS:  BP (!) 142/82   Pulse 88   Ht 5' 5.5" (1.664 m)   Wt 235 lb 12.8 oz (107 kg)   SpO2 92%   BMI 38.64 kg/m     Wt Readings from Last 3 Encounters:  05/26/19 235 lb 12.8 oz (107 kg)  05/06/19 236 lb 12.8 oz (107.4 kg)  04/27/19 235 lb (106.6 kg)     GEN:  Well nourished, well developed in no acute distress HEENT: Normal NECK: No JVD; No carotid bruits LYMPHATICS: No lymphadenopathy CARDIAC: RRR, no murmurs, rubs, gallops RESPIRATORY:  Clear to auscultation without rales, wheezing or rhonchi  ABDOMEN: Soft, non-tender, non-distended MUSCULOSKELETAL:  No edema; No deformity  SKIN: Warm and dry NEUROLOGIC:  Alert and oriented x 3 PSYCHIATRIC:  Normal affect   ASSESSMENT:    1. Claudication in peripheral vascular disease ([place])   2. Chest pain of uncertain etiology   3. SOB (shortness of breath)    PLAN:    In order of problems listed above:  1.  Thoracic aortic aneurysm  -last Chest CT 03/2019 showed extensive irregular and ulcerated atherosclerotic plaque in the descending thoracic aorta with multifocal areas of small penetrating ulcers but no evidence of aneurysm or dissection -Abdominal CTA a year ago showed atheromatous plaque and nonocclusive mural thrombus in distal descending thoracic and abdominal aorta with no aneurysm and left renal artery occlusion and right superior renal artery with high grade stenosis. -Dr. Arita Miss is following her PVD -continue statin  2.  HTN  -BP is fairly well controlled on exam today.   -Continue amlodipine 7.5mg  daily, Toprol XL 25mg  daily and Micardis 80mg  daily.    3.  DOE  -this is likely related to underlying COPD which is followed by Pulmonary -she has had problems  recently with worsening SOB with an intense pain between her shoulder blades when she exerts herself and at rest.   -she had a nuclear stress test a year ago for this that showed no ischemia -she has coronary calcifications noted on chest CTA and we discussed that nuclear stress test can miss some patients with significant CAD -since her symptoms continue to persist and may be worse, I have recommended that we get a coronary CTA to assess for CAD  4.  Thigh pain and leg pain -she is having severe pain in her thighs when she stands as well as pain at night to the point she has trouble getting to sleep -given her significant vascular disease, I have recommended getting LE arterial dopplers -if dopplers are normal then consider a trial of Requip at night  5. HLD -LDL goal < 70 given her PVD -her LDL was 89 last month -increase Crestor to 20mg  daily and repeat FLP and ALT in 6 weeks   Medication Adjustments/Labs and Tests Ordered: Current medicines are reviewed at length with the patient today.  Concerns regarding medicines are outlined above.  Orders Placed This Encounter  Procedures  . CT CORONARY MORPH W/CTA COR W/SCORE W/CA W/CM &/  OR WO/CM  . CT CORONARY FRACTIONAL FLOW RESERVE DATA PREP  . CT CORONARY FRACTIONAL FLOW RESERVE FLUID ANALYSIS  . VAS [place] LOWER EXTREMITY ARTERIAL DUPLEX   Meds ordered this encounter  Medications  . metoprolol tartrate (LOPRESSOR) 100 MG tablet    Sig: Take one tablet by mouth two hours before CT procedure.    Dispense:  1 tablet    Refill:  0    Signed, Fransico Him, MD  05/26/2019 3:23 PM    [place]

## 2019-05-27 ENCOUNTER — Ambulatory Visit: Payer: Medicare Other | Attending: Internal Medicine

## 2019-05-27 DIAGNOSIS — Z23 Encounter for immunization: Secondary | ICD-10-CM

## 2019-05-27 NOTE — Progress Notes (Signed)
   Covid-19 Vaccination Clinic  Name:  Kerry Powell    MRN: [id number] DOB: 1945/12/29  05/27/2019  [name] was observed post Covid-19 immunization for 15 minutes without incident. She was provided with Vaccine Information Sheet and instruction to access the V-Safe system.   [name] was instructed to call 911 with any severe reactions post vaccine: [name] Difficulty breathing  . Swelling of face and throat  . A fast heartbeat  . A bad rash all over body  . Dizziness and weakness   Immunizations Administered    Name Date Dose VIS Date Route   Pfizer COVID-19 Vaccine 05/27/2019  2:32 PM 0.3 mL 02/06/2019 Intramuscular   Manufacturer: [place]   Lot: [id number]   [place]: [id number]

## 2019-05-28 ENCOUNTER — Other Ambulatory Visit: Payer: Medicare Other

## 2019-05-29 ENCOUNTER — Other Ambulatory Visit: Payer: Self-pay | Admitting: Internal Medicine

## 2019-05-29 ENCOUNTER — Other Ambulatory Visit: Payer: Self-pay | Admitting: Cardiology

## 2019-05-29 DIAGNOSIS — I739 Peripheral vascular disease, unspecified: Secondary | ICD-10-CM

## 2019-05-30 ENCOUNTER — Other Ambulatory Visit: Payer: Self-pay

## 2019-05-30 ENCOUNTER — Ambulatory Visit
Admission: RE | Admit: 2019-05-30 | Discharge: 2019-05-30 | Disposition: A | Discharge status: Home / Self Care | Payer: Medicare Other | Source: Ambulatory Visit | Attending: Internal Medicine | Admitting: Internal Medicine

## 2019-05-30 DIAGNOSIS — M5030 Other cervical disc degeneration, unspecified cervical region: Secondary | ICD-10-CM

## 2019-05-30 DIAGNOSIS — M542 Cervicalgia: Secondary | ICD-10-CM

## 2019-06-05 ENCOUNTER — Ambulatory Visit (HOSPITAL_COMMUNITY)
Admission: RE | Admit: 2019-06-05 | Discharge: 2019-06-05 | Disposition: A | Discharge status: Home / Self Care | Payer: Medicare Other | Source: Ambulatory Visit | Attending: Cardiovascular Disease | Admitting: Cardiovascular Disease

## 2019-06-05 ENCOUNTER — Other Ambulatory Visit: Payer: Self-pay

## 2019-06-05 DIAGNOSIS — I739 Peripheral vascular disease, unspecified: Secondary | ICD-10-CM

## 2019-06-05 DIAGNOSIS — M4712 Other spondylosis with myelopathy, cervical region: Secondary | ICD-10-CM

## 2019-06-08 ENCOUNTER — Other Ambulatory Visit: Payer: Self-pay

## 2019-06-08 DIAGNOSIS — R079 Chest pain, unspecified: Secondary | ICD-10-CM

## 2019-06-08 DIAGNOSIS — R0602 Shortness of breath: Secondary | ICD-10-CM

## 2019-06-08 DIAGNOSIS — I10 Essential (primary) hypertension: Secondary | ICD-10-CM

## 2019-06-08 NOTE — Telephone Encounter (Signed)
Called patient, placed referral to Dr. Susa Day per her request to re evaluate her cervical DJD.  Erskine Emery MD

## 2019-06-08 NOTE — Telephone Encounter (Signed)
Dr. Smith please advise

## 2019-06-16 ENCOUNTER — Other Ambulatory Visit: Payer: Self-pay

## 2019-06-16 ENCOUNTER — Other Ambulatory Visit: Payer: Medicare Other

## 2019-06-16 DIAGNOSIS — R0602 Shortness of breath: Secondary | ICD-10-CM

## 2019-06-16 DIAGNOSIS — R079 Chest pain, unspecified: Secondary | ICD-10-CM

## 2019-06-16 DIAGNOSIS — M542 Cervicalgia: Secondary | ICD-10-CM | POA: Insufficient documentation

## 2019-06-16 DIAGNOSIS — I10 Essential (primary) hypertension: Secondary | ICD-10-CM

## 2019-06-17 LAB — BASIC METABOLIC PANEL
BUN/Creatinine Ratio: 15 (ref 12–28)
BUN: 18 mg/dL (ref 8–27)
CO2: 23 mmol/L (ref 20–29)
Calcium: 9.2 mg/dL (ref 8.7–10.3)
Chloride: 99 mmol/L (ref 96–106)
Creatinine, Ser: 1.24 mg/dL — ABNORMAL HIGH (ref 0.57–1.00)
GFR calc Af Amer: 49 mL/min/{1.73_m2} — ABNORMAL LOW (ref >59)
GFR calc non Af Amer: 43 mL/min/{1.73_m2} — ABNORMAL LOW (ref >59)
Glucose: 264 mg/dL — ABNORMAL HIGH (ref 65–99)
Potassium: 5.1 mmol/L (ref 3.5–5.2)
Sodium: 137 mmol/L (ref 134–144)

## 2019-06-25 ENCOUNTER — Telehealth: Payer: Self-pay

## 2019-06-25 ENCOUNTER — Telehealth (HOSPITAL_COMMUNITY): Payer: Self-pay | Admitting: Emergency Medicine

## 2019-06-25 NOTE — Telephone Encounter (Signed)
Reaching out to patient to offer assistance regarding upcoming cardiac imaging study; pt verbalizes understanding of appt date/time, parking situation and where to check in, pre-test NPO status and medications ordered, and verified current allergies; name and call back number provided for further questions should they arise Kerry Bond RN Navigator Cardiac [facility] [place] [facility] [phone] office [phone] cell   Spoke to patient about IV hydration protocol and why I am having her arrive for test early. Pt verbalized understanding.   Kerry Powell

## 2019-06-25 NOTE — Telephone Encounter (Signed)
Received surgical clearance form from [facility] for a procedure. Per Dr. Quay Burow pts needs a surgical clearance appointment. LVM letting pt know and to call back to schedule at her convenience.

## 2019-06-26 ENCOUNTER — Ambulatory Visit (HOSPITAL_COMMUNITY): Admission: RE | Admit: 2019-06-26 | Payer: Medicare Other | Source: Ambulatory Visit

## 2019-06-26 ENCOUNTER — Inpatient Hospital Stay (HOSPITAL_COMMUNITY): Admission: RE | Admit: 2019-06-26 | Payer: Medicare Other | Source: Ambulatory Visit

## 2019-06-27 ENCOUNTER — Other Ambulatory Visit: Payer: Self-pay | Admitting: Internal Medicine

## 2019-06-28 NOTE — Progress Notes (Signed)
Subjective:    Patient ID: Kerry Powell, female    DOB: 04-13-1945, 74 y.o.   MRN: [id number]  HPI She is here for pre-operative clearance at the request of Dr Idelle Jo DDS for two implants scheduled for 07/01/2019.   She denies any personal or family history of problems with anesthesia or bleeding/blood clot problems.    She has no concerns.  She is taking all her medication as prescribed.   She is not exercising regularly.  With her daily activities she denies chest pain, palpitations and lightheadedness.  She does have shortness of breath, which is being evaluated further by cardiology and pulmonary.  She did have a cardiac stress test just over 1 year ago which showed no ischemia.   She is still having back spasms in her mid back.  She has seen orthopedics for this.  He did give her an injection but advised there is not much more that he could do.  She takes the Flexeril only at night, which does help her get through the night.  She typically has some muscle spasms during the day when she is active.  She cannot take the Flexeril during the day because it makes her too drowsy.  Medications and allergies reviewed with patient and updated if appropriate.  Patient Active Problem List   Diagnosis Date Noted  . Pleural plaque 04/27/2019  . Spasm of thoracic back muscle 11/14/2018  . SOB (shortness of breath) 04/17/2018  . Vitamin D deficiency 03/12/2018  . Thoracic aortic aneurysm ([place]) 02/06/2018  . Sleep difficulties 09/09/2017  . Nausea and vomiting 09/09/2017  . Depression 07/01/2017  . Dizziness 06/10/2017  . H/O renal cell carcinoma 05/23/2017  . Hyperlipidemia 06/25/2016  . Occipital neuralgia 05/09/2016  . Anxiety 07/26/2015  . Chronic neck pain 04/30/2015  . Diabetes ([place]) 04/27/2015  . History of CVA (cerebrovascular accident) 04/26/2015  . History of gout 04/26/2015  . CKD (chronic kidney disease) 04/26/2015  . Essential hypertension, benign 04/26/2015  .  Snoring 04/26/2015  . Localized edema 04/26/2015  . Dysphagia 04/26/2015  . GERD (gastroesophageal reflux disease) 04/26/2015  . Personal history of colonic adenoma 05/29/2007  . HIATAL HERNIA 04/07/2007  . RESTLESS LEG SYNDROME 04/04/2007  . 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 COPD 04/04/2007  . IRRITABLE BOWEL SYNDROME 04/04/2007  . OSTEOARTHRITIS 04/04/2007    Current Outpatient Medications on File Prior to Visit  Medication Sig Dispense Refill  . allopurinol (ZYLOPRIM) 300 MG tablet TAKE 1 TABLET BY MOUTH EVERY DAY 90 tablet 0  . amLODipine (NORVASC) 5 MG tablet TAKE 1.5 TABLETS (7.5 MG TOTAL) BY MOUTH DAILY. 135 tablet 0  . cetirizine (ZYRTEC ALLERGY) 10 MG tablet Take 1 tablet (10 mg total) by mouth daily. 30 tablet 11  . cholecalciferol (VITAMIN D3) 25 MCG (1000 UNIT) tablet     . clopidogrel (PLAVIX) 75 MG tablet TAKE 1 TABLET BY MOUTH EVERY DAY 90 tablet 1  . cyclobenzaprine (FLEXERIL) 5 MG tablet Take 1 tablet (5 mg total) by mouth 3 (three) times daily as needed for muscle spasms. 30 tablet 1  . DULoxetine (CYMBALTA) 60 MG capsule TAKE 1 CAPSULE BY MOUTH EVERY DAY 90 capsule 1  . fluticasone (FLONASE) 50 MCG/ACT nasal spray Place 2 sprays into both nostrils daily. 11.1 mL 11  . furosemide (LASIX) 20 MG tablet Take 20 mg by mouth. 3 times a week as needed for swelling    . ipratropium-albuterol (DUONEB) 0.5-2.5 (3) MG/3ML SOLN Take 3 mLs by  nebulization 4 (four) times daily. 360 mL 5  . metFORMIN (GLUCOPHAGE) 500 MG tablet Take 1 tablet (500 mg total) by mouth 2 (two) times daily with a meal. 180 tablet 3  . metoprolol succinate (TOPROL-XL) 25 MG 24 hr tablet TAKE 1 TABLET BY MOUTH EVERY DAY 90 tablet 1  . metoprolol tartrate (LOPRESSOR) 100 MG tablet Take one tablet by mouth two hours before CT procedure. 1 tablet 0  . montelukast (SINGULAIR) 10 MG tablet Take 1 tablet (10 mg total) by mouth at bedtime. 30 tablet 11  . pantoprazole (PROTONIX) 40 MG tablet TAKE 1 TABLET BY  MOUTH EVERY DAY 90 tablet 1  . rosuvastatin (CRESTOR) 20 MG tablet Take 1 tablet (20 mg total) by mouth daily. 90 tablet 3  . sodium chloride (OCEAN) 0.65 % SOLN nasal spray Place 1 spray into both nostrils as needed for congestion. 60 mL 0  . telmisartan (MICARDIS) 80 MG tablet TAKE 1 TABLET BY MOUTH EVERY DAY (Patient taking differently: 40 mg. ) 90 tablet 1  . umeclidinium-vilanterol (ANORO ELLIPTA) 62.5-25 MCG/INH AEPB Inhale 1 puff into the lungs daily. 60 each 5   No current facility-administered medications on file prior to visit.    Past Medical History:  Diagnosis Date  . Anxiety   . Arthritis   . BACK PAIN, CHRONIC 04/04/2007   Qualifier: Diagnosis of  By: Carlean Purl MD, Dimas Millin BIPOLAR AFFECTIVE DISORDER 04/04/2007   Qualifier: History of  By: Carlean Purl MD, Dimas Millin Cataract    removed both eyes  . Chronic neck pain 04/30/2015   Cervical spondylosis [facility] Intermittent numbness and tingling in occipital region and headaches S/p 2 occipital nerve blocks - only temp relief no relief with gabapentin, ultram, brace MRI neck - facet arthrosis   . CKD (chronic kidney disease) 04/26/2015  . COPD (chronic obstructive pulmonary disease) ([place])    pt denies - told this while was smoking -  . Cough 04/26/2015  . Depression 07/01/2017  . Diabetes ([place]) 04/27/2015   Diagnosed 03/2015   . Dizziness 06/10/2017  . Dysphagia 04/26/2015  . DYSPNEA 12/24/2007   Qualifier: Diagnosis of  By: Glennis Brink    . Elevated TSH 08/09/2015  . GASTRIC ULCER, ACUTE 04/07/2007   Annotation: EGD 04/07/07 Qualifier: Diagnosis of  By: Carlean Purl MD, FACG, [place] 04/07/2007   Annotation: EGD 04/07/07 Qualifier: Diagnosis of  By: Carlean Purl MD, Dimas Millin   . GERD (gastroesophageal reflux disease)   . H/O renal cell carcinoma 05/23/2017   Dx 2010, s/p cryoablation Follows with urology - Dr Lovena Neighbours  . HIATAL HERNIA 04/07/2007   Annotation: EGD 04/07/07 Qualifier: Diagnosis of  By:  Carlean Purl MD, Dimas Millin History of CVA (cerebrovascular accident) 04/26/2015  . History of gout 04/26/2015  . Hyperlipidemia   . Hypertension   . Irritable bowel syndrome 04/04/2007   Qualifier: Diagnosis of  By: Carlean Purl MD, Dimas Millin Kidney tumor (benign)   . Nausea and vomiting 09/09/2017  . 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 OSTEOARTHRITIS 04/04/2007   Qualifier: Diagnosis of  By: Carlean Purl MD, Dimas Millin Personal history of colonic adenoma 06/07/2012  . POSTNASAL DRIP SYNDROME 12/29/2007   Qualifier: Diagnosis of  By: Chase Caller MD, [name]    . RESTLESS LEG SYNDROME 04/04/2007   Qualifier: Diagnosis of  By: Carlean Purl MD, Cindra Presume, 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   Qualifier: Diagnosis of  By: Carlean Purl MD, Dimas Millin Sleep difficulties 09/09/2017  . Snoring 04/26/2015  . Stroke [facility])    x2 2011  . Thoracic aortic aneurysm ([place]) 02/06/2018   Seen on MRI 01/25/18 of thoracic spine ordered by [facility] -- referred to CTS to monitor  -- 3.5 cm  . TIA (transient ischemic attack)   . TOBACCO ABUSE 12/17/2007   Qualifier: Diagnosis of  By: Chase Caller MD, [name]    . Trigeminal neuralgia of right side of face 02/08/2016  . URI (upper respiratory infection) 02/08/2016  . Vitamin D deficiency 03/12/2018    Past Surgical History:  Procedure Laterality Date  . ABDOMINAL HYSTERECTOMY    . CATARACT EXTRACTION    . COLONOSCOPY    . CYSTOCELE REPAIR    . ESOPHAGOGASTRODUODENOSCOPY    . KIDNEY SURGERY     benign tumor removal  . ORTHOPEDIC SURGERY     Wrist & elbow  . POLYPECTOMY    . RECTOCELE REPAIR    . UPPER GASTROINTESTINAL ENDOSCOPY      Social History   Socioeconomic History  . Marital status: Married    Spouse name: Not on file  . Number of children: 2  . Years of education: Not on file  . Highest education level: Not on file  Occupational History  . Occupation: Retired  Tobacco Use  .  Smoking status: Former Smoker    Packs/day: 1.00    Years: 46.00    Pack years: 46.00    Quit date: 06/30/2009    Years since quitting: 10.0  . Smokeless tobacco: Never Used  Substance and Sexual Activity  . Alcohol use: No  . Drug use: No  . Sexual activity: Not on file  Other Topics Concern  . Not on file  Social History Narrative   Married, retired   1 son 1 daughter   2 caffeine/day   Social Determinants of Radio broadcast assistant Strain:   . Difficulty of Paying Living Expenses:   Food Insecurity:   . Worried About Charity fundraiser in the Last Year:   . Arboriculturist in the Last Year:   Transportation Needs:   . Film/video editor (Medical):   [name] Lack of Transportation (Non-Medical):   Physical Activity:   . Days of Exercise per Week:   . Minutes of Exercise per Session:   Stress:   . Feeling of Stress :   Social Connections:   . Frequency of Communication with Friends and Family:   . Frequency of Social Gatherings with Friends and Family:   . Attends Religious Services:   . Active Member of Clubs or Organizations:   . Attends Archivist Meetings:   [name] Marital Status:     Family History  Problem Relation Age of Onset  . Congestive Heart Failure Mother   . Cancer Mother        ovarian  . Heart disease Father   . Diabetes Father   . Kidney disease Father   . Diabetes Sister   . Kidney disease Sister   . Colon cancer Neg Hx   . Colon polyps Neg Hx   . Esophageal cancer Neg Hx   . Rectal cancer Neg Hx   . Stomach cancer Neg Hx     Review of Systems  Constitutional: Positive for chills. Negative for fever.  HENT: Positive for voice change.   Respiratory: Positive for shortness of breath (with exertion). Negative for cough and wheezing.   Cardiovascular: Positive for palpitations (once in a while). Negative for chest pain and leg swelling.  Gastrointestinal: Negative for abdominal pain, blood in stool (no black stool), constipation,  diarrhea and nausea.       No gerd  Genitourinary: Negative for dysuria and hematuria.  Musculoskeletal: Positive for back pain.  Skin: Negative for rash.  Neurological: Positive for headaches. Negative for dizziness and light-headedness.  Psychiatric/Behavioral: Negative for dysphoric mood.       Objective:   Vitals:   06/29/19 1000  BP: (!) 142/70  Pulse: 71  Resp: 16  Temp: 98.2 F (36.8 C)  SpO2: 94%   Filed Weights   06/29/19 1000  Weight: 238 lb (108 kg)   Body mass index is 39 kg/m.  BP Readings from Last 3 Encounters:  06/29/19 (!) 142/70  05/26/19 (!) 142/82  05/06/19 136/82    Wt Readings from Last 3 Encounters:  06/29/19 238 lb (108 kg)  05/26/19 235 lb 12.8 oz (107 kg)  05/06/19 236 lb 12.8 oz (107.4 kg)     Physical Exam Constitutional: She appears well-developed and well-nourished. No distress.  HENT:  Head: Normocephalic and atraumatic.  Eyes: Conjunctivae and EOM are normal.  Neck: Neck supple. No tracheal deviation present. No thyromegaly present.  No carotid bruit  Cardiovascular: Normal rate, regular rhythm and normal heart sounds.  No murmur heard.  No edema. Pulmonary/Chest: Effort normal and breath sounds normal. No respiratory distress. She has no wheezes. She has no rales.  Abdominal: Soft. She exhibits no distension. There is no tenderness.  Lymphadenopathy: She has no cervical adenopathy.  Skin: Skin is warm and dry. She is not diaphoretic.  Psychiatric: She has a normal mood and affect. Her behavior is normal.        Assessment & Plan:      See Problem List for Assessment and Plan of chronic medical problems.    This visit occurred during the SARS-CoV-2 public health emergency.  Safety protocols were in place, including screening questions prior to the visit, additional usage of staff PPE, and extensive cleaning of exam room while observing appropriate contact time as indicated for disinfecting solutions.

## 2019-06-29 ENCOUNTER — Ambulatory Visit: Payer: Medicare Other | Admitting: Internal Medicine

## 2019-06-29 ENCOUNTER — Encounter: Payer: Self-pay | Admitting: Internal Medicine

## 2019-06-29 ENCOUNTER — Other Ambulatory Visit: Payer: Self-pay

## 2019-06-29 VITALS — BP 142/70 | HR 71 | Temp 98.2°F | Resp 16 | Ht 65.5 in | Wt 238.0 lb

## 2019-06-29 DIAGNOSIS — Z01818 Encounter for other preprocedural examination: Secondary | ICD-10-CM | POA: Diagnosis not present

## 2019-06-29 DIAGNOSIS — M62830 Muscle spasm of back: Secondary | ICD-10-CM | POA: Diagnosis not present

## 2019-06-29 DIAGNOSIS — I10 Essential (primary) hypertension: Secondary | ICD-10-CM | POA: Diagnosis not present

## 2019-06-29 DIAGNOSIS — E119 Type 2 diabetes mellitus without complications: Secondary | ICD-10-CM | POA: Diagnosis not present

## 2019-06-29 NOTE — Assessment & Plan Note (Signed)
Lab Results  Component Value Date   HGBA1C 7.3 (H) 03/30/2019   Sugars reasonably controlled Continue current medications

## 2019-06-29 NOTE — Assessment & Plan Note (Signed)
Chronic-tends to occur when she is active Takes Flexeril at night and does not have any overnight, but cannot take Flexeril during the day Discussed with her trying baclofen during the day to see if that helps-she will think about this and let me know

## 2019-06-29 NOTE — Assessment & Plan Note (Signed)
Acute-here for a visit for preoperative clearance for dental procedure in 2 days Her current medical problems are currently controlled and stable She is undergoing further evaluation by cardiology and pulmonary for her shortness of breath, but does not thought to be related to cardiac ischemia She will hold the Plavix tomorrow and Wednesday and then restart Thursday if okay with the dentist Continue other medications as is Cleared for procedure-we will be having IV sedation

## 2019-06-29 NOTE — Assessment & Plan Note (Signed)
Chronic Blood pressure reasonably controlled Continue current medications

## 2019-06-29 NOTE — Patient Instructions (Signed)
Think about trying baclofen for your back spasms during the day.    Hold the plavix tomorrow and Wednesday.  Restart it Thursday if ok with the dentist.

## 2019-06-30 ENCOUNTER — Ambulatory Visit (INDEPENDENT_AMBULATORY_CARE_PROVIDER_SITE_OTHER)
Admission: RE | Admit: 2019-06-30 | Discharge: 2019-06-30 | Disposition: A | Discharge status: Home / Self Care | Payer: Medicare Other | Source: Ambulatory Visit | Attending: Internal Medicine | Admitting: Internal Medicine

## 2019-06-30 DIAGNOSIS — J948 Other specified pleural conditions: Secondary | ICD-10-CM | POA: Diagnosis not present

## 2019-07-01 ENCOUNTER — Telehealth (HOSPITAL_COMMUNITY): Payer: Self-pay | Admitting: Emergency Medicine

## 2019-07-01 NOTE — Telephone Encounter (Signed)
Attempted to call patient regarding upcoming cardiac CT appointment. °Left message on voicemail with name and callback number °Marc Leichter RN Navigator Cardiac Imaging °[facility] °[phone] Office °[phone] Cell ° °

## 2019-07-01 NOTE — Telephone Encounter (Signed)
error 

## 2019-07-02 ENCOUNTER — Other Ambulatory Visit: Payer: Self-pay

## 2019-07-02 ENCOUNTER — Telehealth: Payer: Self-pay

## 2019-07-02 ENCOUNTER — Encounter: Payer: Self-pay | Admitting: Cardiology

## 2019-07-02 ENCOUNTER — Ambulatory Visit (HOSPITAL_COMMUNITY)
Admission: RE | Admit: 2019-07-02 | Discharge: 2019-07-02 | Disposition: A | Discharge status: Home / Self Care | Payer: Medicare Other | Source: Ambulatory Visit | Attending: Cardiology | Admitting: Cardiology

## 2019-07-02 DIAGNOSIS — R0602 Shortness of breath: Secondary | ICD-10-CM | POA: Diagnosis present

## 2019-07-02 DIAGNOSIS — I700 Atherosclerosis of aorta: Secondary | ICD-10-CM | POA: Insufficient documentation

## 2019-07-02 DIAGNOSIS — R079 Chest pain, unspecified: Secondary | ICD-10-CM | POA: Insufficient documentation

## 2019-07-02 LAB — BASIC METABOLIC PANEL
Anion gap: 9 (ref 5–15)
BUN: 20 mg/dL (ref 8–23)
CO2: 24 mmol/L (ref 22–32)
Calcium: 8.9 mg/dL (ref 8.9–10.3)
Chloride: 100 mmol/L (ref 98–111)
Creatinine, Ser: 1.5 mg/dL — ABNORMAL HIGH (ref 0.44–1.00)
GFR calc Af Amer: 39 mL/min — ABNORMAL LOW (ref >60)
GFR calc non Af Amer: 34 mL/min — ABNORMAL LOW (ref >60)
Glucose, Bld: 142 mg/dL — ABNORMAL HIGH (ref 70–99)
Potassium: 4.8 mmol/L (ref 3.5–5.1)
Sodium: 133 mmol/L — ABNORMAL LOW (ref 135–145)

## 2019-07-02 MED ORDER — SODIUM CHLORIDE 0.9 % WEIGHT BASED INFUSION
3.0000 mL/kg/h | INTRAVENOUS | Status: AC
  Administered 2019-07-02: 3 mL/kg/h via INTRAVENOUS

## 2019-07-02 MED ORDER — ROSUVASTATIN CALCIUM 40 MG PO TABS
40.0000 mg | ORAL_TABLET | Freq: Every day | ORAL | 3 refills | Status: DC
Start: 2019-07-02 — End: 2020-09-13

## 2019-07-02 MED ORDER — IOHEXOL 350 MG/ML SOLN
80.0000 mL | Freq: Once | INTRAVENOUS | Status: AC | PRN
  Administered 2019-07-02: 80 mL via INTRAVENOUS

## 2019-07-02 MED ORDER — NITROGLYCERIN 0.4 MG SL SUBL
SUBLINGUAL_TABLET | SUBLINGUAL | Status: AC
  Filled 2019-07-02: qty 2

## 2019-07-02 MED ORDER — SODIUM CHLORIDE 0.9 % WEIGHT BASED INFUSION: 1.0000 mL/kg/h | INTRAVENOUS | Status: DC

## 2019-07-02 NOTE — Telephone Encounter (Signed)
-----   Message from Sueanne Margarita, MD sent at 07/02/2019  2:15 PM EDT ----- Calcified plaque in the descending aorta.  Given this finding her LDL goal is < 70 and last check was 89.  Please increase crestor to 40mg  daily and check FLP and ALT in 6 weeks

## 2019-07-02 NOTE — Telephone Encounter (Signed)
The patient has been notified of the result and verbalized understanding.  All questions (if any) were answered. Antonieta Iba, RN 07/02/2019 4:00 PM

## 2019-07-07 ENCOUNTER — Telehealth: Payer: Self-pay | Admitting: Internal Medicine

## 2019-07-07 ENCOUNTER — Other Ambulatory Visit: Payer: Medicare Other

## 2019-07-07 DIAGNOSIS — R918 Other nonspecific abnormal finding of lung field: Secondary | ICD-10-CM

## 2019-07-07 NOTE — Telephone Encounter (Signed)
Kerry Furbish, MD  P [facility] Triage Pool  Chest Without contrast  Follow up lung mass  Schedule in Aug 2021   I will call patient   Thank you   Order has been placed for the CT to be performed in August 2021. Recall has been placed for pt's follow up in August. Nothing further needed.

## 2019-07-08 ENCOUNTER — Encounter: Payer: Self-pay | Admitting: Cardiology

## 2019-07-08 DIAGNOSIS — I2510 Atherosclerotic heart disease of native coronary artery without angina pectoris: Secondary | ICD-10-CM | POA: Insufficient documentation

## 2019-07-14 DIAGNOSIS — D6869 Other thrombophilia: Secondary | ICD-10-CM | POA: Insufficient documentation

## 2019-07-21 LAB — HM DIABETES EYE EXAM

## 2019-07-24 ENCOUNTER — Other Ambulatory Visit: Payer: Self-pay | Admitting: Internal Medicine

## 2019-07-25 ENCOUNTER — Other Ambulatory Visit: Payer: Self-pay | Admitting: Internal Medicine

## 2019-08-10 ENCOUNTER — Other Ambulatory Visit: Payer: Medicare Other | Admitting: *Deleted

## 2019-08-10 ENCOUNTER — Other Ambulatory Visit: Payer: Self-pay

## 2019-08-10 DIAGNOSIS — I739 Peripheral vascular disease, unspecified: Secondary | ICD-10-CM

## 2019-08-10 DIAGNOSIS — R0602 Shortness of breath: Secondary | ICD-10-CM

## 2019-08-10 DIAGNOSIS — E785 Hyperlipidemia, unspecified: Secondary | ICD-10-CM

## 2019-08-10 DIAGNOSIS — R079 Chest pain, unspecified: Secondary | ICD-10-CM

## 2019-08-10 LAB — LIPID PANEL
Chol/HDL Ratio: 3.8 ratio (ref 0.0–4.4)
Cholesterol, Total: 132 mg/dL (ref 100–199)
HDL: 35 mg/dL — ABNORMAL LOW (ref >39)
LDL Chol Calc (NIH): 74 mg/dL (ref 0–99)
Triglycerides: 129 mg/dL (ref 0–149)
VLDL Cholesterol Cal: 23 mg/dL (ref 5–40)

## 2019-08-10 LAB — ALT: ALT: 12 IU/L (ref 0–32)

## 2019-08-12 ENCOUNTER — Encounter: Payer: Self-pay | Admitting: Internal Medicine

## 2019-08-12 MED ORDER — ALLOPURINOL 300 MG PO TABS: 300.0000 mg | ORAL_TABLET | Freq: Every day | ORAL | 1 refills | Status: DC

## 2019-08-12 MED ORDER — AMLODIPINE BESYLATE 5 MG PO TABS: 7.5000 mg | ORAL_TABLET | Freq: Every day | ORAL | 1 refills | Status: DC

## 2019-08-30 ENCOUNTER — Other Ambulatory Visit: Payer: Self-pay | Admitting: Cardiology

## 2019-08-30 ENCOUNTER — Other Ambulatory Visit: Payer: Self-pay | Admitting: Internal Medicine

## 2019-09-04 ENCOUNTER — Encounter: Payer: Self-pay | Admitting: Internal Medicine

## 2019-09-29 NOTE — Patient Instructions (Addendum)
Blood work was ordered.    All other Health Maintenance issues reviewed.   All recommended immunizations and age-appropriate screenings are up-to-date or discussed.  No immunization administered today.   Medications reviewed and updated.  Changes include :  none   Your prescription(s) have been submitted to your pharmacy. Please take as directed and contact our office if you believe you are having problem(s) with the medication(s).  A referral was ordered for Dr Ardis Hughs.    Please followup in 6 months    Health Maintenance, Female Adopting a healthy lifestyle and getting preventive care are important in promoting health and wellness. Ask your health care provider about:  The right schedule for you to have regular tests and exams.  Things you can do on your own to prevent diseases and keep yourself healthy. What should I know about diet, weight, and exercise? Eat a healthy diet   Eat a diet that includes plenty of vegetables, fruits, low-fat dairy products, and lean protein.  Do not eat a lot of foods that are high in solid fats, added sugars, or sodium. Maintain a healthy weight Body mass index (BMI) is used to identify weight problems. It estimates body fat based on height and weight. Your health care provider can help determine your BMI and help you achieve or maintain a healthy weight. Get regular exercise Get regular exercise. This is one of the most important things you can do for your health. Most adults should:  Exercise for at least 150 minutes each week. The exercise should increase your heart rate and make you sweat (moderate-intensity exercise).  Do strengthening exercises at least twice a week. This is in addition to the moderate-intensity exercise.  Spend less time sitting. Even light physical activity can be beneficial. Watch cholesterol and blood lipids Have your blood tested for lipids and cholesterol at 74 years of age, then have this test every 5  years. Have your cholesterol levels checked more often if:  Your lipid or cholesterol levels are high.  You are older than 74 years of age.  You are at high risk for heart disease. What should I know about cancer screening? Depending on your health history and family history, you may need to have cancer screening at various ages. This may include screening for:  Breast cancer.  Cervical cancer.  Colorectal cancer.  Skin cancer.  Lung cancer. What should I know about heart disease, diabetes, and high blood pressure? Blood pressure and heart disease  High blood pressure causes heart disease and increases the risk of stroke. This is more likely to develop in people who have high blood pressure readings, are of African descent, or are overweight.  Have your blood pressure checked: ? Every 3-5 years if you are 36-41 years of age. ? Every year if you are 35 years old or older. Diabetes Have regular diabetes screenings. This checks your fasting blood sugar level. Have the screening done:  Once every three years after age 63 if you are at a normal weight and have a low risk for diabetes.  More often and at a younger age if you are overweight or have a high risk for diabetes. What should I know about preventing infection? Hepatitis B If you have a higher risk for hepatitis B, you should be screened for this virus. Talk with your health care provider to find out if you are at risk for hepatitis B infection. Hepatitis C Testing is recommended for:  Everyone born from [birth year] through 1965.  Anyone with known risk factors for hepatitis C. Sexually transmitted infections (STIs)  Get screened for STIs, including gonorrhea and chlamydia, if: ? You are sexually active and are younger than 74 years of age. ? You are older than 74 years of age and your health care provider tells you that you are at risk for this type of infection. ? Your sexual activity has changed since you were last  screened, and you are at increased risk for chlamydia or gonorrhea. Ask your health care provider if you are at risk.  Ask your health care provider about whether you are at high risk for HIV. Your health care provider may recommend a prescription medicine to help prevent HIV infection. If you choose to take medicine to prevent HIV, you should first get tested for HIV. You should then be tested every 3 months for as long as you are taking the medicine. Pregnancy  If you are about to stop having your period (premenopausal) and you may become pregnant, seek counseling before you get pregnant.  Take 400 to 800 micrograms (mcg) of folic acid every day if you become pregnant.  Ask for birth control (contraception) if you want to prevent pregnancy. Osteoporosis and menopause Osteoporosis is a disease in which the bones lose minerals and strength with aging. This can result in bone fractures. If you are 60 years old or older, or if you are at risk for osteoporosis and fractures, ask your health care provider if you should:  Be screened for bone loss.  Take a calcium or vitamin D supplement to lower your risk of fractures.  Be given hormone replacement therapy (HRT) to treat symptoms of menopause. Follow these instructions at home: Lifestyle  Do not use any products that contain nicotine or tobacco, such as cigarettes, e-cigarettes, and chewing tobacco. If you need help quitting, ask your health care provider.  Do not use street drugs.  Do not share needles.  Ask your health care provider for help if you need support or information about quitting drugs. Alcohol use  Do not drink alcohol if: ? Your health care provider tells you not to drink. ? You are pregnant, may be pregnant, or are planning to become pregnant.  If you drink alcohol: ? Limit how much you use to 0-1 drink a day. ? Limit intake if you are breastfeeding.  Be aware of how much alcohol is in your drink. In the [place] one  drink equals one 12 oz bottle of beer (355 mL), one 5 oz glass of wine (148 mL), or one 1 oz glass of hard liquor (44 mL). General instructions  Schedule regular health, dental, and eye exams.  Stay current with your vaccines.  Tell your health care provider if: ? You often feel depressed. ? You have ever been abused or do not feel safe at home. Summary  Adopting a healthy lifestyle and getting preventive care are important in promoting health and wellness.  Follow your health care provider's instructions about healthy diet, exercising, and getting tested or screened for diseases.  Follow your health care provider's instructions on monitoring your cholesterol and blood pressure. This information is not intended to replace advice given to you by your health care provider. Make sure you discuss any questions you have with your health care provider. Document Revised: 02/05/2018 Document Reviewed: 02/05/2018 Elsevier Patient Education  2020 [employer].

## 2019-09-29 NOTE — Progress Notes (Signed)
Subjective:    Patient ID: Kerry Powell, female    DOB: [birth year], 74 y.o.   MRN: [id number]  HPI She is here for a physical exam.   She is itching all over.   She does have increased stress.  She feels her medication is helping overall.  If she avoid bending over and certain movements and avoid her SOB her back is not as bad.  If she does not move too much then her joint pain gets worse.  Medications and allergies reviewed with patient and updated if appropriate.  Patient Active Problem List   Diagnosis Date Noted  . CAD (coronary artery disease), native coronary artery   . Pleural plaque 04/27/2019  . Spasm of thoracic back muscle 11/14/2018  . SOB (shortness of breath) 04/17/2018  . Vitamin D deficiency 03/12/2018  . Thoracic aortic aneurysm ([place]) 02/06/2018  . Sleep difficulties 09/09/2017  . Nausea and vomiting 09/09/2017  . Depression 07/01/2017  . Dizziness 06/10/2017  . H/O renal cell carcinoma 05/23/2017  . Hyperlipidemia 06/25/2016  . Occipital neuralgia 05/09/2016  . Anxiety 07/26/2015  . Chronic neck pain 04/30/2015  . Diabetes ([place]) 04/27/2015  . History of CVA (cerebrovascular accident) 04/26/2015  . History of gout 04/26/2015  . CKD (chronic kidney disease) 04/26/2015  . Essential hypertension, benign 04/26/2015  . Snoring 04/26/2015  . Localized edema 04/26/2015  . Dysphagia 04/26/2015  . GERD (gastroesophageal reflux disease) 04/26/2015  . Personal history of colonic adenoma 05/29/2007  . HIATAL HERNIA 04/07/2007  . RESTLESS LEG SYNDROME 04/04/2007  . 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 COPD 04/04/2007  . IRRITABLE BOWEL SYNDROME 04/04/2007  . OSTEOARTHRITIS 04/04/2007    Current Outpatient Medications on File Prior to Visit  Medication Sig Dispense Refill  . allopurinol (ZYLOPRIM) 300 MG tablet Take 1 tablet (300 mg total) by mouth daily. 90 tablet 1  . amLODipine (NORVASC) 5 MG tablet Take 1.5 tablets (7.5 mg total) by mouth daily. 135  tablet 1  . cetirizine (ZYRTEC ALLERGY) 10 MG tablet Take 1 tablet (10 mg total) by mouth daily. 30 tablet 11  . cholecalciferol (VITAMIN D3) 25 MCG (1000 UNIT) tablet     . clopidogrel (PLAVIX) 75 MG tablet TAKE 1 TABLET BY MOUTH EVERY DAY 90 tablet 1  . cyclobenzaprine (FLEXERIL) 5 MG tablet TAKE 1 TABLET BY MOUTH THREE TIMES A DAY AS NEEDED FOR MUSCLE SPASMS (NON FORMULARY) 30 tablet 1  . DULoxetine (CYMBALTA) 60 MG capsule TAKE 1 CAPSULE BY MOUTH EVERY DAY 90 capsule 1  . fluticasone (FLONASE) 50 MCG/ACT nasal spray Place 2 sprays into both nostrils daily. 11.1 mL 11  . furosemide (LASIX) 20 MG tablet Take 20 mg by mouth. 3 times a week as needed for swelling    . ipratropium-albuterol (DUONEB) 0.5-2.5 (3) MG/3ML SOLN Take 3 mLs by nebulization 4 (four) times daily. 360 mL 5  . metFORMIN (GLUCOPHAGE) 500 MG tablet Take 1 tablet (500 mg total) by mouth 2 (two) times daily with a meal. 180 tablet 3  . metoprolol succinate (TOPROL-XL) 25 MG 24 hr tablet TAKE 1 TABLET BY MOUTH EVERY DAY 90 tablet 1  . montelukast (SINGULAIR) 10 MG tablet Take 1 tablet (10 mg total) by mouth at bedtime. 30 tablet 11  . pantoprazole (PROTONIX) 40 MG tablet TAKE 1 TABLET BY MOUTH EVERY DAY 90 tablet 1  . rosuvastatin (CRESTOR) 40 MG tablet Take 1 tablet (40 mg total) by mouth daily. 90 tablet 3  . sodium chloride (OCEAN)  0.65 % SOLN nasal spray Place 1 spray into both nostrils as needed for congestion. 60 mL 0  . telmisartan (MICARDIS) 80 MG tablet TAKE 1 TABLET BY MOUTH EVERY DAY (Patient taking differently: 40 mg. ) 90 tablet 1  . umeclidinium-vilanterol (ANORO ELLIPTA) 62.5-25 MCG/INH AEPB Inhale 1 puff into the lungs daily. 60 each 5   No current facility-administered medications on file prior to visit.    Past Medical History:  Diagnosis Date  . Anxiety   . Arthritis   . BACK PAIN, CHRONIC 04/04/2007   Qualifier: Diagnosis of  By: Carlean Purl MD, Dimas Millin BIPOLAR AFFECTIVE DISORDER 04/04/2007    Qualifier: History of  By: Carlean Purl MD, Dimas Millin   . CAD (coronary artery disease), native coronary artery    mild CAD with no obstructive disease by coronary CTA 06/2019  . Cataract    removed both eyes  . Chronic neck pain 04/30/2015   Cervical spondylosis [facility] Intermittent numbness and tingling in occipital region and headaches S/p 2 occipital nerve blocks - only temp relief no relief with gabapentin, ultram, brace MRI neck - facet arthrosis   . CKD (chronic kidney disease) 04/26/2015  . COPD (chronic obstructive pulmonary disease) ([place])    pt denies - told this while was smoking -  . Cough 04/26/2015  . Depression 07/01/2017  . Diabetes ([place]) 04/27/2015   Diagnosed 03/2015   . Dizziness 06/10/2017  . Dysphagia 04/26/2015  . DYSPNEA 12/24/2007   Qualifier: Diagnosis of  By: Glennis Brink    . Elevated TSH 08/09/2015  . GASTRIC ULCER, ACUTE 04/07/2007   Annotation: EGD 04/07/07 Qualifier: Diagnosis of  By: Carlean Purl MD, FACG, [place] 04/07/2007   Annotation: EGD 04/07/07 Qualifier: Diagnosis of  By: Carlean Purl MD, Dimas Millin   . GERD (gastroesophageal reflux disease)   . H/O renal cell carcinoma 05/23/2017   Dx 2010, s/p cryoablation Follows with urology - Dr Lovena Neighbours  . HIATAL HERNIA 04/07/2007   Annotation: EGD 04/07/07 Qualifier: Diagnosis of  By: Carlean Purl MD, Dimas Millin History of CVA (cerebrovascular accident) 04/26/2015  . History of gout 04/26/2015  . Hyperlipidemia   . Hypertension   . Irritable bowel syndrome 04/04/2007   Qualifier: Diagnosis of  By: Carlean Purl MD, Dimas Millin Kidney tumor (benign)   . Nausea and vomiting 09/09/2017  . 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 OSTEOARTHRITIS 04/04/2007   Qualifier: Diagnosis of  By: Carlean Purl MD, Dimas Millin Personal history of colonic adenoma 06/07/2012  . POSTNASAL DRIP SYNDROME 12/29/2007   Qualifier: Diagnosis of  By: Chase Caller MD, [name]    .  RESTLESS LEG SYNDROME 04/04/2007   Qualifier: Diagnosis of  By: Carlean Purl MD, FACG, [place], 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   Qualifier: Diagnosis of  By: Carlean Purl MD, Dimas Millin Sleep difficulties 09/09/2017  . Snoring 04/26/2015  . Stroke [facility])    x2 2011  . Thoracic aortic aneurysm ([place]) 02/06/2018   Seen on MRI 01/25/18 of thoracic spine ordered by [facility] -- referred to CTS to monitor  -- 3.5 cm  . TIA (transient ischemic attack)   . TOBACCO ABUSE 12/17/2007   Qualifier: Diagnosis of  By: Chase Caller MD, [name]    . Trigeminal neuralgia of right side of face 02/08/2016  . URI (upper respiratory infection)  02/08/2016  . Vitamin D deficiency 03/12/2018    Past Surgical History:  Procedure Laterality Date  . ABDOMINAL HYSTERECTOMY    . CATARACT EXTRACTION    . COLONOSCOPY    . CYSTOCELE REPAIR    . ESOPHAGOGASTRODUODENOSCOPY    . KIDNEY SURGERY     benign tumor removal  . ORTHOPEDIC SURGERY     Wrist & elbow  . POLYPECTOMY    . RECTOCELE REPAIR    . UPPER GASTROINTESTINAL ENDOSCOPY      Social History   Socioeconomic History  . Marital status: Married    Spouse name: Not on file  . Number of children: 2  . Years of education: Not on file  . Highest education level: Not on file  Occupational History  . Occupation: Retired  Tobacco Use  . Smoking status: Former Smoker    Packs/day: 1.00    Years: 46.00    Pack years: 46.00    Quit date: 06/30/2009    Years since quitting: 10.2  . Smokeless tobacco: Never Used  Vaping Use  . Vaping Use: Never used  Substance and Sexual Activity  . Alcohol use: No  . Drug use: No  . Sexual activity: Not on file  Other Topics Concern  . Not on file  Social History Narrative   Married, retired   1 son 1 daughter   2 caffeine/day   Social Determinants of Radio broadcast assistant Strain:   . Difficulty of Paying Living Expenses:   Food Insecurity:   . Worried About Charity fundraiser in the Last Year:   . Arts development officer in the Last Year:   Transportation Needs:   . Film/video editor (Medical):   [name] Lack of Transportation (Non-Medical):   Physical Activity:   . Days of Exercise per Week:   . Minutes of Exercise per Session:   Stress:   . Feeling of Stress :   Social Connections:   . Frequency of Communication with Friends and Family:   . Frequency of Social Gatherings with Friends and Family:   . Attends Religious Services:   . Active Member of Clubs or Organizations:   . Attends Archivist Meetings:   [name] Marital Status:     Family History  Problem Relation Age of Onset  . Congestive Heart Failure Mother   . Cancer Mother        ovarian  . Heart disease Father   . Diabetes Father   . Kidney disease Father   . Diabetes Sister   . Kidney disease Sister   . Colon cancer Neg Hx   . Colon polyps Neg Hx   . Esophageal cancer Neg Hx   . Rectal cancer Neg Hx   . Stomach cancer Neg Hx     Review of Systems  Constitutional: Negative for chills and fever.  Eyes: Positive for visual disturbance (blurry vision - may need laser treatment, started on restasis).  Respiratory: Positive for cough (dry), shortness of breath and wheezing.   Cardiovascular: Positive for leg swelling (lasix prn). Negative for chest pain and palpitations.  Gastrointestinal: Negative for abdominal pain, blood in stool, constipation, diarrhea and nausea.       Has difficulty having a BM - feels the muscle in the lower rectum is not working - has rectocele.   No gerd  Genitourinary: Positive for difficulty urinating. Negative for dysuria and hematuria.  Musculoskeletal: Positive for arthralgias and back pain.  Skin: Negative  for rash.       itching  Neurological: Positive for light-headedness (from neb treatments) and headaches.  Hematological: Bruises/bleeds easily.  Psychiatric/Behavioral: Positive for dysphoric mood. The patient is nervous/anxious.        Objective:   Vitals:   09/30/19 1434   BP: 124/82  Pulse: 82  Temp: 98.3 F (36.8 C)  SpO2: 92%   Filed Weights   09/30/19 1434  Weight: 233 lb (105.7 kg)   Body mass index is 38.77 kg/m.  BP Readings from Last 3 Encounters:  09/30/19 124/82  07/02/19 (!) 146/75  06/29/19 (!) 142/70    Wt Readings from Last 3 Encounters:  09/30/19 233 lb (105.7 kg)  07/02/19 234 lb (106.1 kg)  06/29/19 238 lb (108 kg)     Physical Exam Constitutional: She appears well-developed and well-nourished. No distress.  HENT:  Head: Normocephalic and atraumatic.  Right Ear: External ear normal. Normal ear canal and TM Left Ear: External ear normal.  Normal ear canal and TM Mouth/Throat: Oropharynx is clear and moist.  Eyes: Conjunctivae and EOM are normal.  Neck: Neck supple. No tracheal deviation present. No thyromegaly present.  No carotid bruit  Cardiovascular: Normal rate, regular rhythm and normal heart sounds.   No murmur heard.  No edema. Pulmonary/Chest: Effort normal and breath sounds normal. No respiratory distress. She has no wheezes. She has no rales.  Breast: deferred   Abdominal: Soft. She exhibits no distension. There is no tenderness.  Lymphadenopathy: She has no cervical adenopathy.  Skin: Skin is warm and dry. She is not diaphoretic.  Psychiatric: She has a normal mood and affect. Her behavior is normal.        Assessment & Plan:   Physical exam: Screening blood work    ordered Immunizations  Declined all vaccines, may consider shingrix Colonoscopy  Due - referred Mammogram  Due - will think about it Gyn  n/a Dexa  declined Eye exams  Up to date  - Dr  Kathlen Mody Exercise  none Weight  Advised weight loss Substance abuse  none  See Problem List for Assessment and Plan of chronic medical problems.   This visit occurred during the SARS-CoV-2 public health emergency.  Safety protocols were in place, including screening questions prior to the visit, additional usage of staff PPE, and extensive cleaning of  exam room while observing appropriate contact time as indicated for disinfecting solutions.

## 2019-09-30 ENCOUNTER — Encounter: Payer: Self-pay | Admitting: Internal Medicine

## 2019-09-30 ENCOUNTER — Ambulatory Visit (INDEPENDENT_AMBULATORY_CARE_PROVIDER_SITE_OTHER): Payer: Medicare Other | Admitting: Internal Medicine

## 2019-09-30 ENCOUNTER — Other Ambulatory Visit: Payer: Self-pay

## 2019-09-30 VITALS — BP 124/82 | HR 82 | Temp 98.3°F | Ht 65.0 in | Wt 233.0 lb

## 2019-09-30 DIAGNOSIS — I10 Essential (primary) hypertension: Secondary | ICD-10-CM

## 2019-09-30 DIAGNOSIS — Z0000 Encounter for general adult medical examination without abnormal findings: Secondary | ICD-10-CM

## 2019-09-30 DIAGNOSIS — J449 Chronic obstructive pulmonary disease, unspecified: Secondary | ICD-10-CM

## 2019-09-30 DIAGNOSIS — F3289 Other specified depressive episodes: Secondary | ICD-10-CM

## 2019-09-30 DIAGNOSIS — E119 Type 2 diabetes mellitus without complications: Secondary | ICD-10-CM | POA: Diagnosis not present

## 2019-09-30 DIAGNOSIS — N189 Chronic kidney disease, unspecified: Secondary | ICD-10-CM

## 2019-09-30 DIAGNOSIS — E559 Vitamin D deficiency, unspecified: Secondary | ICD-10-CM

## 2019-09-30 DIAGNOSIS — I712 Thoracic aortic aneurysm, without rupture, unspecified: Secondary | ICD-10-CM

## 2019-09-30 DIAGNOSIS — M5481 Occipital neuralgia: Secondary | ICD-10-CM

## 2019-09-30 DIAGNOSIS — F419 Anxiety disorder, unspecified: Secondary | ICD-10-CM

## 2019-09-30 DIAGNOSIS — K219 Gastro-esophageal reflux disease without esophagitis: Secondary | ICD-10-CM

## 2019-09-30 DIAGNOSIS — Z1211 Encounter for screening for malignant neoplasm of colon: Secondary | ICD-10-CM

## 2019-09-30 DIAGNOSIS — Z8739 Personal history of other diseases of the musculoskeletal system and connective tissue: Secondary | ICD-10-CM

## 2019-09-30 DIAGNOSIS — E7849 Other hyperlipidemia: Secondary | ICD-10-CM

## 2019-09-30 MED ORDER — BUTALBITAL-ACETAMINOPHEN 50-325 MG PO TABS: ORAL_TABLET | ORAL | 3 refills | Status: DC

## 2019-09-30 NOTE — Assessment & Plan Note (Signed)
Chronic Check lipid panel  Continue daily statin Regular exercise and healthy diet encouraged  

## 2019-09-30 NOTE — Assessment & Plan Note (Signed)
Chronic Following with pulmonary-Management per them

## 2019-09-30 NOTE — Assessment & Plan Note (Signed)
Chronic Continue allopurinol

## 2019-09-30 NOTE — Assessment & Plan Note (Signed)
Check vitamin D level  taking vitamin D daily

## 2019-09-30 NOTE — Assessment & Plan Note (Signed)
Chronic °Continue metformin °Check a1c °Low sugar / carb diet °Stressed regular exercise ° ° °

## 2019-09-30 NOTE — Assessment & Plan Note (Signed)
Chronic GERD controlled Continue daily medication  

## 2019-09-30 NOTE — Assessment & Plan Note (Signed)
Chronic BP well controlled Current regimen effective and well tolerated Continue current medications at current doses cmp  

## 2019-09-30 NOTE — Assessment & Plan Note (Signed)
Chronic Sees nephro cmp

## 2019-09-30 NOTE — Assessment & Plan Note (Signed)
Chronic Monitored by cardiothoracic surgery

## 2019-09-30 NOTE — Assessment & Plan Note (Signed)
Chronic Controlled, stable Continue current dose of medication Continue Cymbalta at current dose

## 2019-09-30 NOTE — Assessment & Plan Note (Signed)
chronic Scalp is so sore and itches Neck is sore

## 2019-09-30 NOTE — Assessment & Plan Note (Signed)
Chronic Controlled, stable Continue current dose of medication cymbalta 60 mg daily

## 2019-10-01 LAB — CBC WITH DIFFERENTIAL/PLATELET
Absolute Monocytes: 747 cells/uL (ref 200–950)
Basophils Absolute: 51 cells/uL (ref 0–200)
Basophils Relative: 0.5 %
Eosinophils Absolute: 354 cells/uL (ref 15–500)
Eosinophils Relative: 3.5 %
HCT: 35 % (ref 35.0–45.0)
Hemoglobin: 10.6 g/dL — ABNORMAL LOW (ref 11.7–15.5)
Lymphs Abs: 2313 cells/uL (ref 850–3900)
MCH: 25.3 pg — ABNORMAL LOW (ref 27.0–33.0)
MCHC: 30.3 g/dL — ABNORMAL LOW (ref 32.0–36.0)
MCV: 83.5 fL (ref 80.0–100.0)
MPV: 12.2 fL (ref 7.5–12.5)
Monocytes Relative: 7.4 %
Neutro Abs: 6636 cells/uL (ref 1500–7800)
Neutrophils Relative %: 65.7 %
Platelets: 392 10*3/uL (ref 140–400)
RBC: 4.19 10*6/uL (ref 3.80–5.10)
RDW: 17.2 % — ABNORMAL HIGH (ref 11.0–15.0)
Total Lymphocyte: 22.9 %
WBC: 10.1 10*3/uL (ref 3.8–10.8)

## 2019-10-01 LAB — HEMOGLOBIN A1C
Hgb A1c MFr Bld: 7.4 % of total Hgb — ABNORMAL HIGH (ref <5.7)
Mean Plasma Glucose: 166 (calc)
eAG (mmol/L): 9.2 (calc)

## 2019-10-01 LAB — COMPREHENSIVE METABOLIC PANEL
AG Ratio: 1.6 (calc) (ref 1.0–2.5)
ALT: 16 U/L (ref 6–29)
AST: 15 U/L (ref 10–35)
Albumin: 4.2 g/dL (ref 3.6–5.1)
Alkaline phosphatase (APISO): 116 U/L (ref 37–153)
BUN/Creatinine Ratio: 15 (calc) (ref 6–22)
BUN: 21 mg/dL (ref 7–25)
CO2: 27 mmol/L (ref 20–32)
Calcium: 9.4 mg/dL (ref 8.6–10.4)
Chloride: 104 mmol/L (ref 98–110)
Creat: 1.44 mg/dL — ABNORMAL HIGH (ref 0.60–0.93)
Globulin: 2.7 g/dL (calc) (ref 1.9–3.7)
Glucose, Bld: 105 mg/dL — ABNORMAL HIGH (ref 65–99)
Potassium: 4.9 mmol/L (ref 3.5–5.3)
Sodium: 137 mmol/L (ref 135–146)
Total Bilirubin: 0.3 mg/dL (ref 0.2–1.2)
Total Protein: 6.9 g/dL (ref 6.1–8.1)

## 2019-10-01 LAB — TSH: TSH: 2.12 mIU/L (ref 0.40–4.50)

## 2019-10-01 LAB — LIPID PANEL
Cholesterol: 132 mg/dL (ref <200)
HDL: 44 mg/dL — ABNORMAL LOW (ref >=50)
LDL Cholesterol (Calc): 69 mg/dL (calc)
Non-HDL Cholesterol (Calc): 88 mg/dL (calc) (ref <130)
Total CHOL/HDL Ratio: 3 (calc) (ref <5.0)
Triglycerides: 111 mg/dL (ref <150)

## 2019-10-01 LAB — VITAMIN D 25 HYDROXY (VIT D DEFICIENCY, FRACTURES): Vit D, 25-Hydroxy: 25 ng/mL — ABNORMAL LOW (ref 30–100)

## 2019-10-02 ENCOUNTER — Other Ambulatory Visit: Payer: Self-pay | Admitting: Internal Medicine

## 2019-10-08 ENCOUNTER — Other Ambulatory Visit: Payer: Medicare Other

## 2019-10-09 ENCOUNTER — Encounter: Payer: Self-pay | Admitting: Internal Medicine

## 2019-10-27 ENCOUNTER — Ambulatory Visit: Payer: Medicare Other | Admitting: Critical Care Medicine

## 2019-11-06 ENCOUNTER — Encounter: Payer: Self-pay | Admitting: Internal Medicine

## 2019-11-18 ENCOUNTER — Encounter: Payer: Self-pay | Admitting: Internal Medicine

## 2019-11-18 NOTE — Progress Notes (Signed)
Outside notes received. Information abstracted. Notes sent to scan.  

## 2019-12-06 NOTE — Progress Notes (Signed)
Subjective:    Patient ID: Kerry Powell, female    DOB: 09-May-1945, 74 y.o.   MRN: [id number]  HPI The patient is here for an acute visit.   ? Low vitamin d level - the past couple of months more days than not she feels like her muscles and joints are not there when she gets up to walk.  She is joint pain and muscle pain.  She saw on the Internet her symptoms could be a low vitamin d or K2 level.  She wondered if she needed to increase her vitamin D.  Both her joints and muscles hurt. She has an appt with ortho regarding her wrist and can discuss her bilateral thumb pain.  She also has weakness in her thumbs.  Sometimes her finger joints popped up and hurt.  She is stiffness in the morning that lasts more than 1 hour.  All of her joints hurt.  She did not sleep last night.  Her sleep varies.  She often does the called at different times-between 2 AM and 4 AM.  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  She has never taken anything for it.  Some nights she will not sleep at all.  She does feel fatigued and overall does not feel well.     Medications and allergies reviewed with patient and updated if appropriate.  Patient Active Problem List   Diagnosis Date Noted  . CAD (coronary artery disease), native coronary artery   . Pleural plaque 04/27/2019  . Spasm of thoracic back muscle 11/14/2018  . SOB (shortness of breath) 04/17/2018  . Vitamin D deficiency 03/12/2018  . Thoracic aortic aneurysm ([place]) 02/06/2018  . Sleep difficulties 09/09/2017  . Depression 07/01/2017  . Dizziness 06/10/2017  . H/O renal cell carcinoma 05/23/2017  . Hyperlipidemia 06/25/2016  . Occipital neuralgia 05/09/2016  . Anxiety 07/26/2015  . Chronic neck pain 04/30/2015  . Diabetes ([place]) 04/27/2015  . History of CVA (cerebrovascular accident) 04/26/2015  . History of gout 04/26/2015  . CKD (chronic kidney disease) 04/26/2015  .  Essential hypertension, benign 04/26/2015  . Snoring 04/26/2015  . Localized edema 04/26/2015  . Dysphagia 04/26/2015  . GERD (gastroesophageal reflux disease) 04/26/2015  . Personal history of colonic adenoma 05/29/2007  . HIATAL HERNIA 04/07/2007  . RESTLESS LEG SYNDROME 04/04/2007  . 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 COPD (chronic obstructive pulmonary disease) ([place]) 04/04/2007  . IRRITABLE BOWEL SYNDROME 04/04/2007  . OSTEOARTHRITIS 04/04/2007    Current Outpatient Medications on File Prior to Visit  Medication Sig Dispense Refill  . ACETAMINOPHEN-BUTALBITAL 50-325 MG TABS TAKE 1 TABLET BY MOUTH EVERY DAY AS NEEDED 120 tablet 3  . allopurinol (ZYLOPRIM) 300 MG tablet Take 1 tablet (300 mg total) by mouth daily. 90 tablet 1  . amLODipine (NORVASC) 5 MG tablet Take 1.5 tablets (7.5 mg total) by mouth daily. 135 tablet 1  . cetirizine (ZYRTEC ALLERGY) 10 MG tablet Take 1 tablet (10 mg total) by mouth daily. 30 tablet 11  . cholecalciferol (VITAMIN D3) 25 MCG (1000 UNIT) tablet Take 2,000 Units by mouth daily.    . clopidogrel (PLAVIX) 75 MG tablet TAKE 1 TABLET BY MOUTH EVERY DAY 90 tablet 1  . cyclobenzaprine (FLEXERIL) 5 MG tablet TAKE 1 TABLET BY MOUTH THREE TIMES A DAY AS NEEDED FOR MUSCLE SPASMS (NON FORMULARY) 30 tablet 1  . DULoxetine (CYMBALTA) 60 MG capsule  TAKE 1 CAPSULE BY MOUTH EVERY DAY 90 capsule 1  . fluticasone (FLONASE) 50 MCG/ACT nasal spray Place 2 sprays into both nostrils daily. 11.1 mL 11  . furosemide (LASIX) 20 MG tablet Take 20 mg by mouth. 3 times a week as needed for swelling    . ipratropium-albuterol (DUONEB) 0.5-2.5 (3) MG/3ML SOLN Take 3 mLs by nebulization 4 (four) times daily. 360 mL 5  . metFORMIN (GLUCOPHAGE) 500 MG tablet Take 1 tablet (500 mg total) by mouth 2 (two) times daily with a meal. 180 tablet 3  . metoprolol succinate (TOPROL-XL) 25 MG 24 hr tablet TAKE 1 TABLET BY MOUTH EVERY DAY 90 tablet 1  . montelukast (SINGULAIR) 10 MG tablet Take  1 tablet (10 mg total) by mouth at bedtime. 30 tablet 11  . pantoprazole (PROTONIX) 40 MG tablet TAKE 1 TABLET BY MOUTH EVERY DAY 90 tablet 1  . rosuvastatin (CRESTOR) 40 MG tablet Take 1 tablet (40 mg total) by mouth daily. 90 tablet 3  . sodium chloride (OCEAN) 0.65 % SOLN nasal spray Place 1 spray into both nostrils as needed for congestion. 60 mL 0  . telmisartan (MICARDIS) 40 MG tablet Take 40 mg by mouth daily.    [name] umeclidinium-vilanterol (ANORO ELLIPTA) 62.5-25 MCG/INH AEPB Inhale 1 puff into the lungs daily. 60 each 5   No current facility-administered medications on file prior to visit.    Past Medical History:  Diagnosis Date  . Anxiety   . Arthritis   . BACK PAIN, CHRONIC 04/04/2007   Qualifier: Diagnosis of  By: Carlean Purl MD, Dimas Millin BIPOLAR AFFECTIVE DISORDER 04/04/2007   Qualifier: History of  By: Carlean Purl MD, Dimas Millin   . CAD (coronary artery disease), native coronary artery    mild CAD with no obstructive disease by coronary CTA 06/2019  . Cataract    removed both eyes  . Chronic neck pain 04/30/2015   Cervical spondylosis [place] [facility] Intermittent numbness and tingling in occipital region and headaches S/p 2 occipital nerve blocks - only temp relief no relief with gabapentin, ultram, brace MRI neck - facet arthrosis   . CKD (chronic kidney disease) 04/26/2015  . COPD (chronic obstructive pulmonary disease) ([place])    pt denies - told this while was smoking -  . Cough 04/26/2015  . Depression 07/01/2017  . Diabetes ([place]) 04/27/2015   Diagnosed 03/2015   . Dizziness 06/10/2017  . Dysphagia 04/26/2015  . DYSPNEA 12/24/2007   Qualifier: Diagnosis of  By: Glennis Brink    . Elevated TSH 08/09/2015  . GASTRIC ULCER, ACUTE 04/07/2007   Annotation: EGD 04/07/07 Qualifier: Diagnosis of  By: Carlean Purl MD, FACG, [place] 04/07/2007   Annotation: EGD 04/07/07 Qualifier: Diagnosis of  By: Carlean Purl MD, Dimas Millin   . GERD (gastroesophageal reflux disease)   .  H/O renal cell carcinoma 05/23/2017   Dx 2010, s/p cryoablation Follows with urology - Dr Lovena Neighbours  . HIATAL HERNIA 04/07/2007   Annotation: EGD 04/07/07 Qualifier: Diagnosis of  By: Carlean Purl MD, Dimas Millin History of CVA (cerebrovascular accident) 04/26/2015  . History of gout 04/26/2015  . Hyperlipidemia   . Hypertension   . Irritable bowel syndrome 04/04/2007   Qualifier: Diagnosis of  By: Carlean Purl MD, Dimas Millin Kidney tumor (benign)   . Nausea and vomiting 09/09/2017  . 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  OSTEOARTHRITIS 04/04/2007   Qualifier: Diagnosis of  By: Carlean Purl MD, Dimas Millin Personal history of colonic adenoma 06/07/2012  . POSTNASAL DRIP SYNDROME 12/29/2007   Qualifier: Diagnosis of  By: Chase Caller MD, [name]    . RESTLESS LEG SYNDROME 04/04/2007   Qualifier: Diagnosis of  By: Carlean Purl MD, FACG, [place], 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   Qualifier: Diagnosis of  By: Carlean Purl MD, Dimas Millin Sleep difficulties 09/09/2017  . Snoring 04/26/2015  . Stroke [facility])    x2 2011  . Thoracic aortic aneurysm ([place]) 02/06/2018   Seen on MRI 01/25/18 of thoracic spine ordered [facility] -- referred to CTS to monitor  -- 3.5 cm  . TIA (transient ischemic attack)   . TOBACCO ABUSE 12/17/2007   Qualifier: Diagnosis of  By: Chase Caller MD, [name]    . Trigeminal neuralgia of right side of face 02/08/2016  . URI (upper respiratory infection) 02/08/2016  . Vitamin D deficiency 03/12/2018    Past Surgical History:  Procedure Laterality Date  . ABDOMINAL HYSTERECTOMY    . CATARACT EXTRACTION    . COLONOSCOPY    . CYSTOCELE REPAIR    . ESOPHAGOGASTRODUODENOSCOPY    . KIDNEY SURGERY     benign tumor removal  . ORTHOPEDIC SURGERY     Wrist & elbow  . POLYPECTOMY    . RECTOCELE REPAIR    . UPPER GASTROINTESTINAL ENDOSCOPY      Social History   Socioeconomic History  . Marital status: Married    Spouse name: Not  on file  . Number of children: 2  . Years of education: Not on file  . Highest education level: Not on file  Occupational History  . Occupation: Retired  Tobacco Use  . Smoking status: Former Smoker    Packs/day: 1.00    Years: 46.00    Pack years: 46.00    Quit date: 06/30/2009    Years since quitting: 10.4  . Smokeless tobacco: Never Used  Vaping Use  . Vaping Use: Never used  Substance and Sexual Activity  . Alcohol use: No  . Drug use: No  . Sexual activity: Not on file  Other Topics Concern  . Not on file  Social History Narrative   Married, retired   1 son 1 daughter   2 caffeine/day   Social Determinants of Health   Financial Resource Strain:   . Difficulty of Paying Living Expenses: Not on file  Food Insecurity:   . Worried About Charity fundraiser in the Last Year: Not on file  . Ran Out of Food in the Last Year: Not on file  Transportation Needs:   . Lack of Transportation (Medical): Not on file  . Lack of Transportation (Non-Medical): Not on file  Physical Activity:   . Days of Exercise per Week: Not on file  . Minutes of Exercise per Session: Not on file  Stress:   . Feeling of Stress : Not on file  Social Connections:   . Frequency of Communication with Friends and Family: Not on file  . Frequency of Social Gatherings with Friends and Family: Not on file  . Attends Religious Services: Not on file  . Active Member of Clubs or Organizations: Not on file  . Attends Archivist Meetings: Not on file  . Marital Status: Not on file    Family History  Problem Relation Age of Onset  . Congestive Heart Failure Mother   .  Cancer Mother        ovarian  . Heart disease Father   . Diabetes Father   . Kidney disease Father   . Diabetes Sister   . Kidney disease Sister   . Colon cancer Neg Hx   . Colon polyps Neg Hx   . Esophageal cancer Neg Hx   . Rectal cancer Neg Hx   . Stomach cancer Neg Hx     Review of Systems  Constitutional: Negative  for fever.  Respiratory: Positive for shortness of breath (with exertion).   Musculoskeletal: Positive for arthralgias, back pain, joint swelling and myalgias (back, arms, legs).  Skin: Negative for rash.  Neurological: Positive for headaches. Negative for light-headedness.       Objective:   Vitals:   12/07/19 1306  BP: 130/68  Pulse: 72  Temp: 98.3 F (36.8 C)  SpO2: 94%   BP Readings from Last 3 Encounters:  12/07/19 130/68  09/30/19 124/82  07/02/19 (!) 146/75   Wt Readings from Last 3 Encounters:  12/07/19 233 lb (105.7 kg)  09/30/19 233 lb (105.7 kg)  07/02/19 234 lb (106.1 kg)   Body mass index is 38.77 kg/m.   Physical Exam Constitutional:      General: She is not in acute distress.    Appearance: Normal appearance. She is not ill-appearing.  HENT:     Head: Normocephalic and atraumatic.  Cardiovascular:     Rate and Rhythm: Normal rate and regular rhythm.  Pulmonary:     Effort: Pulmonary effort is normal. No respiratory distress.     Breath sounds: No wheezing or rales.  Musculoskeletal:     Right lower leg: Edema (Trace) present.     Left lower leg: Edema (Trace) present.     Comments: Muscle tenderness in back, arms and legs with palpation.  Some tenderness in certain joints with palpation.  Skin:    General: Skin is warm and dry.  Neurological:     General: No focal deficit present.     Mental Status: She is alert.            Assessment & Plan:    See Problem List for Assessment and Plan of chronic medical problems.    This visit occurred during the SARS-CoV-2 public health emergency.  Safety protocols were in place, including screening questions prior to the visit, additional usage of staff PPE, and extensive cleaning of exam room while observing appropriate contact time as indicated for disinfecting solutions.

## 2019-12-07 ENCOUNTER — Other Ambulatory Visit: Payer: Self-pay

## 2019-12-07 ENCOUNTER — Ambulatory Visit: Payer: Medicare Other | Admitting: Internal Medicine

## 2019-12-07 ENCOUNTER — Encounter: Payer: Self-pay | Admitting: Internal Medicine

## 2019-12-07 VITALS — BP 130/68 | HR 72 | Temp 98.3°F | Ht 65.0 in | Wt 233.0 lb

## 2019-12-07 DIAGNOSIS — M2550 Pain in unspecified joint: Secondary | ICD-10-CM | POA: Diagnosis not present

## 2019-12-07 DIAGNOSIS — R5383 Other fatigue: Secondary | ICD-10-CM | POA: Insufficient documentation

## 2019-12-07 DIAGNOSIS — M791 Myalgia, unspecified site: Secondary | ICD-10-CM | POA: Diagnosis not present

## 2019-12-07 DIAGNOSIS — D649 Anemia, unspecified: Secondary | ICD-10-CM

## 2019-12-07 DIAGNOSIS — G2581 Restless legs syndrome: Secondary | ICD-10-CM

## 2019-12-07 DIAGNOSIS — E559 Vitamin D deficiency, unspecified: Secondary | ICD-10-CM

## 2019-12-07 LAB — SEDIMENTATION RATE: Sed Rate: 76 mm/hr — ABNORMAL HIGH (ref 0–30)

## 2019-12-07 LAB — CBC WITH DIFFERENTIAL/PLATELET
Basophils Absolute: 0.1 10*3/uL (ref 0.0–0.1)
Basophils Relative: 0.7 % (ref 0.0–3.0)
Eosinophils Absolute: 0.3 10*3/uL (ref 0.0–0.7)
Eosinophils Relative: 2.6 % (ref 0.0–5.0)
HCT: 33.7 % — ABNORMAL LOW (ref 36.0–46.0)
Hemoglobin: 10.2 g/dL — ABNORMAL LOW (ref 12.0–15.0)
Lymphocytes Relative: 16.9 % (ref 12.0–46.0)
Lymphs Abs: 2 10*3/uL (ref 0.7–4.0)
MCHC: 30.4 g/dL (ref 30.0–36.0)
MCV: 79.4 fl (ref 78.0–100.0)
Monocytes Absolute: 0.8 10*3/uL (ref 0.1–1.0)
Monocytes Relative: 6.7 % (ref 3.0–12.0)
Neutro Abs: 8.8 10*3/uL — ABNORMAL HIGH (ref 1.4–7.7)
Neutrophils Relative %: 73.1 % (ref 43.0–77.0)
Platelets: 394 10*3/uL (ref 150.0–400.0)
RBC: 4.24 Mil/uL (ref 3.87–5.11)
RDW: 17.5 % — ABNORMAL HIGH (ref 11.5–15.5)
WBC: 12.1 10*3/uL — ABNORMAL HIGH (ref 4.0–10.5)

## 2019-12-07 LAB — CK: Total CK: 118 U/L (ref 7–177)

## 2019-12-07 LAB — C-REACTIVE PROTEIN: CRP: 1.3 mg/dL (ref 0.5–20.0)

## 2019-12-07 NOTE — Assessment & Plan Note (Addendum)
Acute on chronic Likely multifactorial She did not sleep at all last night and her sleep is inconsistent which is likely contributing Stressed that she needs to have a bedtime and wake time and be consistent with it.  No sleeping during the day Discussed possibly starting something for RLS, but she deferred for now-does not want to take more medications Stop acetaminophen-butalbital, which she is taking on a daily basis and could be contributing to some of her symptoms-advised not to take more than once a week Trial of cyclobenzaprine at night to help with some of her muscle pain and sleep Check lab work today-CBC, and rule out autoimmune disease May need to be retested for sleep apnea

## 2019-12-07 NOTE — Patient Instructions (Addendum)
  Blood work was ordered.     Medications reviewed and updated.  Changes include :   Stop the acetaminophen - butalbital - you can take once a week only.   Start flexeril (cyclobenzaprine 5 mg at bedtime).    Go to bed at the same time every night.       Please followup in 6 weeks

## 2019-12-07 NOTE — Assessment & Plan Note (Signed)
Chronic Has not been in any medication and deferred medication at this time because she does not want to take any more medication This could be affecting her sleep quality Trial of Flexeril at night to help her sleep and muscle pain We will be checking CBC, iron level

## 2019-12-07 NOTE — Assessment & Plan Note (Signed)
Acute on chronic She has had some joint pain but it seems to be worse now ?  Autoimmune in nature versus diffuse osteoarthritis Check ANA, CRP, ESR, RF Further evaluation and treatment depending on blood work

## 2019-12-07 NOTE — Assessment & Plan Note (Signed)
New problem She describes diffuse myalgias that do cause significant pain ?  Autoimmune,?  Statin related Check ESR, CRP, ANA, CCP, RF, CK It does not help that she is not sleeping consistently and did not sleep at all last night-advised Flexeril 5 mg at night

## 2019-12-07 NOTE — Assessment & Plan Note (Signed)
Chronic Mild anemia Check CBC, iron levels Possibly related to chronic kidney disease

## 2019-12-07 NOTE — Assessment & Plan Note (Signed)
Chronic Advised that I do not think her vitamin D deficiency is causing any of her symptoms since it is mild Continue vitamin D daily Check level

## 2019-12-08 LAB — VITAMIN D 25 HYDROXY (VIT D DEFICIENCY, FRACTURES): VITD: 34.21 ng/mL (ref 30.00–100.00)

## 2019-12-10 LAB — ANA: Anti Nuclear Antibody (ANA): NEGATIVE

## 2019-12-10 LAB — IRON,TIBC AND FERRITIN PANEL
%SAT: 5 % (calc) — ABNORMAL LOW (ref 16–45)
Ferritin: 6 ng/mL — ABNORMAL LOW (ref 16–288)
Iron: 20 ug/dL — ABNORMAL LOW (ref 45–160)
TIBC: 440 mcg/dL (calc) (ref 250–450)

## 2019-12-10 LAB — RHEUMATOID FACTOR: Rheumatoid fact SerPl-aCnc: <14 IU/mL (ref <14)

## 2019-12-12 ENCOUNTER — Other Ambulatory Visit: Payer: Self-pay | Admitting: Internal Medicine

## 2019-12-12 DIAGNOSIS — R700 Elevated erythrocyte sedimentation rate: Secondary | ICD-10-CM

## 2019-12-24 DIAGNOSIS — M79644 Pain in right finger(s): Secondary | ICD-10-CM | POA: Insufficient documentation

## 2019-12-24 DIAGNOSIS — M79645 Pain in left finger(s): Secondary | ICD-10-CM | POA: Insufficient documentation

## 2019-12-26 ENCOUNTER — Encounter: Payer: Self-pay | Admitting: Internal Medicine

## 2019-12-29 ENCOUNTER — Encounter: Payer: Self-pay | Admitting: Internal Medicine

## 2019-12-29 ENCOUNTER — Other Ambulatory Visit: Payer: Self-pay | Admitting: Internal Medicine

## 2019-12-30 MED ORDER — CYCLOBENZAPRINE HCL 5 MG PO TABS
ORAL_TABLET | ORAL | 1 refills | Status: DC
Start: 2019-12-30 — End: 2020-02-14

## 2020-01-04 ENCOUNTER — Ambulatory Visit: Payer: Medicare Other | Admitting: Adult Health

## 2020-01-04 ENCOUNTER — Other Ambulatory Visit: Payer: Self-pay

## 2020-01-04 ENCOUNTER — Encounter: Payer: Self-pay | Admitting: Adult Health

## 2020-01-04 VITALS — BP 110/60 | HR 64 | Temp 97.3°F | Ht 65.0 in | Wt 233.8 lb

## 2020-01-04 DIAGNOSIS — J929 Pleural plaque without asbestos: Secondary | ICD-10-CM | POA: Diagnosis not present

## 2020-01-04 DIAGNOSIS — J449 Chronic obstructive pulmonary disease, unspecified: Secondary | ICD-10-CM

## 2020-01-04 DIAGNOSIS — R1310 Dysphagia, unspecified: Secondary | ICD-10-CM | POA: Diagnosis not present

## 2020-01-04 MED ORDER — METHOCARBAMOL 500 MG PO TABS: 500.0000 mg | ORAL_TABLET | Freq: Two times a day (BID) | ORAL | 5 refills | Status: DC | PRN

## 2020-01-04 NOTE — Patient Instructions (Addendum)
Set up for CT chest .  Continue on ANORO 1 puff daily , rinse after use.  Albuterol inhaler or Duoneb As needed  Wheezing .  Activity as tolerated.  Mucinex Twice daily  As needed  Cough/congestiion  Delsym 2 tsp Twice daily  As needed  Cough.  Continue on Zyrtec daily  Continue on Protonix 40mg  daily .  Add Pepcid 20mg  At bedtime   Refer to GI for dysphagia.  Follow up with Dr Silas Flood in 2-3 weeks with PFT and As needed   Please contact office for sooner follow up if symptoms do not improve or worsen or seek emergency care

## 2020-01-04 NOTE — Progress Notes (Signed)
@Patient  ID: Kerry Powell, female    DOB: 10-Jul-1945, 74 y.o.   MRN: [id number]  Chief Complaint  Patient presents with  . Follow-up    Referring provider: Binnie Rail, MD  HPI: 74 year old female former smoker seen for pulmonary consult March 2021 for abnormal CT chest and dyspnea  TEST/EVENTS :  CT chest April 21, 2019 showed moderate emphysema slight interval progression.  Increasing pleural-parenchymal scarring especially in the left lung apex.  Somewhat masslike region of soft tissue density with spiculated margins measuring 3.0 x 1.9 cm.  CT chest Jul 01, 2019 emphysema, stable biapical pleural thickening.  No significant change in the left apex asymmetry  01/04/2020 Follow up : Lung mass , Cough  Patient returns for a follow-up visit.  Patient complains that her cough and congestion have persisted and continued to be worrisome.  She has daily cough and congestion seems to be worse with eating.  Has episodes where she feels that she has difficulty swallowing.  Feels that food does not go all the way down easily.  Patient has nasal congestion postnasal drainage. She has underlying emphysema.  She is a former smoker.  She is on Anoro daily. As above she has a abnormal CT chest with masslike soft tissue density and the left apex. This is being followed up on serial CT.  She denies any hemoptysis, unintentional weight loss. .   Allergies  Allergen Reactions  . Gabapentin Other (See Comments)    Fluid retention   . Clindamycin Rash  . Sulfa Antibiotics Nausea And Vomiting    Immunization History  Administered Date(s) Administered  . PFIZER SARS-COV-2 Vaccination 05/01/2019, 05/27/2019    Past Medical History:  Diagnosis Date  . Anxiety   . Arthritis   . BACK PAIN, CHRONIC 04/04/2007   Qualifier: Diagnosis of  By: Carlean Purl MD, Dimas Millin BIPOLAR AFFECTIVE DISORDER 04/04/2007   Qualifier: History of  By: Carlean Purl MD, Dimas Millin   . CAD (coronary artery  disease), native coronary artery    mild CAD with no obstructive disease by coronary CTA 06/2019  . Cataract    removed both eyes  . Chronic neck pain 04/30/2015   Cervical spondylosis [place] [facility] Intermittent numbness and tingling in occipital region and headaches S/p 2 occipital nerve blocks - only temp relief no relief with gabapentin, ultram, brace MRI neck - facet arthrosis   . CKD (chronic kidney disease) 04/26/2015  . COPD (chronic obstructive pulmonary disease) ([place])    pt denies - told this while was smoking -  . Cough 04/26/2015  . Depression 07/01/2017  . Diabetes ([place]) 04/27/2015   Diagnosed 03/2015   . Dizziness 06/10/2017  . Dysphagia 04/26/2015  . DYSPNEA 12/24/2007   Qualifier: Diagnosis of  By: Glennis Brink    . Elevated TSH 08/09/2015  . GASTRIC ULCER, ACUTE 04/07/2007   Annotation: EGD 04/07/07 Qualifier: Diagnosis of  By: Carlean Purl MD, FACG, [place] 04/07/2007   Annotation: EGD 04/07/07 Qualifier: Diagnosis of  By: Carlean Purl MD, Dimas Millin   . GERD (gastroesophageal reflux disease)   . H/O renal cell carcinoma 05/23/2017   Dx 2010, s/p cryoablation Follows with urology - Dr Lovena Neighbours  . HIATAL HERNIA 04/07/2007   Annotation: EGD 04/07/07 Qualifier: Diagnosis of  By: Carlean Purl MD, Dimas Millin History of CVA (cerebrovascular accident) 04/26/2015  . History of gout 04/26/2015  . Hyperlipidemia   . Hypertension   .  Irritable bowel syndrome 04/04/2007   Qualifier: Diagnosis of  By: Carlean Purl MD, Dimas Millin Kidney tumor (benign)   . Nausea and vomiting 09/09/2017  . 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 OSTEOARTHRITIS 04/04/2007   Qualifier: Diagnosis of  By: Carlean Purl MD, Dimas Millin Personal history of colonic adenoma 06/07/2012  . POSTNASAL DRIP SYNDROME 12/29/2007   Qualifier: Diagnosis of  By: Chase Caller MD, [name]    . RESTLESS LEG SYNDROME 04/04/2007   Qualifier: Diagnosis of  By: Carlean Purl MD, FACG,  [place], 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   Qualifier: Diagnosis of  By: Carlean Purl MD, Dimas Millin Sleep difficulties 09/09/2017  . Snoring 04/26/2015  . Stroke [facility])    x2 2011  . Thoracic aortic aneurysm ([place]) 02/06/2018   Seen on MRI 01/25/18 of thoracic spine ordered by [facility] -- referred to CTS to monitor  -- 3.5 cm  . TIA (transient ischemic attack)   . TOBACCO ABUSE 12/17/2007   Qualifier: Diagnosis of  By: Chase Caller MD, [name]    . Trigeminal neuralgia of right side of face 02/08/2016  . URI (upper respiratory infection) 02/08/2016  . Vitamin D deficiency 03/12/2018    Tobacco History: Social History   Tobacco Use  Smoking Status Former Smoker  . Packs/day: 1.00  . Years: 46.00  . Pack years: 46.00  . Quit date: 06/30/2009  . Years since quitting: 10.5  Smokeless Tobacco Never Used   Counseling given: Not Answered   Outpatient Medications Prior to Visit  Medication Sig Dispense Refill  . ACETAMINOPHEN-BUTALBITAL 50-325 MG TABS TAKE 1 TABLET BY MOUTH EVERY DAY AS NEEDED (Patient taking differently: TAKE 1 TABLET by mouth once a week) 120 tablet 3  . allopurinol (ZYLOPRIM) 300 MG tablet Take 1 tablet (300 mg total) by mouth daily. 90 tablet 1  . amLODipine (NORVASC) 5 MG tablet Take 1.5 tablets (7.5 mg total) by mouth daily. 135 tablet 1  . cetirizine (ZYRTEC ALLERGY) 10 MG tablet Take 1 tablet (10 mg total) by mouth daily. 30 tablet 11  . cholecalciferol (VITAMIN D3) 25 MCG (1000 UNIT) tablet Take 2,000 Units by mouth daily.    . clopidogrel (PLAVIX) 75 MG tablet TAKE 1 TABLET BY MOUTH EVERY DAY 90 tablet 1  . cyclobenzaprine (FLEXERIL) 5 MG tablet TAKE 1 TABLET BY MOUTH THREE TIMES A DAY AS NEEDED FOR MUSCLE SPASMS (NON FORMULARY) (Patient taking differently: at bedtime. TAKE 1 TABLET AT HS FOR MUSCLE SPASMS (NON FORMULARY)) 270 tablet 1  . DULoxetine (CYMBALTA) 60 MG capsule TAKE 1 CAPSULE BY MOUTH EVERY DAY 90 capsule 1  . ferrous sulfate 325 (65 FE) MG  tablet Take 325 mg by mouth daily with breakfast.    . fluticasone (FLONASE) 50 MCG/ACT nasal spray Place 2 sprays into both nostrils daily. 11.1 mL 11  . furosemide (LASIX) 20 MG tablet Take 20 mg by mouth. 3 times a week as needed for swelling    . ipratropium-albuterol (DUONEB) 0.5-2.5 (3) MG/3ML SOLN Take 3 mLs by nebulization 4 (four) times daily. 360 mL 5  . metFORMIN (GLUCOPHAGE) 500 MG tablet Take 1 tablet (500 mg total) by mouth 2 (two) times daily with a meal. 180 tablet 3  . methocarbamol (ROBAXIN) 500 MG tablet Take 1 tablet (500 mg total) by mouth 2 (two) times daily as needed for muscle spasms. 60 tablet 5  . metoprolol succinate (  TOPROL-XL) 25 MG 24 hr tablet TAKE 1 TABLET BY MOUTH EVERY DAY 90 tablet 1  . montelukast (SINGULAIR) 10 MG tablet Take 1 tablet (10 mg total) by mouth at bedtime. 30 tablet 11  . pantoprazole (PROTONIX) 40 MG tablet TAKE 1 TABLET BY MOUTH EVERY DAY 90 tablet 1  . rosuvastatin (CRESTOR) 40 MG tablet Take 1 tablet (40 mg total) by mouth daily. 90 tablet 3  . sodium chloride (OCEAN) 0.65 % SOLN nasal spray Place 1 spray into both nostrils as needed for congestion. 60 mL 0  . telmisartan (MICARDIS) 40 MG tablet Take 40 mg by mouth daily.    [name] umeclidinium-vilanterol (ANORO ELLIPTA) 62.5-25 MCG/INH AEPB Inhale 1 puff into the lungs daily. 60 each 5   No facility-administered medications prior to visit.     Review of Systems:   Constitutional:   No  weight loss, night sweats,  Fevers, chills, + fatigue, or  lassitude.  HEENT:   No headaches,  Difficulty swallowing,  Tooth/dental problems, or  Sore throat,                No sneezing, itching, ear ache,  +nasal congestion, post nasal drip,   CV:  No chest pain,  Orthopnea, PND, swelling in lower extremities, anasarca, dizziness, palpitations, syncope.   GI  No heartburn, indigestion, abdominal pain, nausea, vomiting, diarrhea, change in bowel habits, loss of appetite, bloody stools.   Resp:    No chest  wall deformity  Skin: no rash or lesions.  GU: no dysuria, change in color of urine, no urgency or frequency.  No flank pain, no hematuria   MS:  No joint pain or swelling.  No decreased range of motion.  No back pain.    Physical Exam  BP 110/60 (BP Location: Left Arm, Patient Position: Sitting, Cuff Size: Normal)   Pulse 64   Temp (!) 97.3 F (36.3 C) (Temporal)   Ht 5\' 5"  (1.651 m)   Wt 233 lb 12.8 oz (106.1 kg)   SpO2 95%   BMI 38.91 kg/m   GEN: A/Ox3; pleasant , NAD, well nourished    HEENT:  [place]/AT,   NOSE-clear, THROAT-clear, no lesions, no postnasal drip or exudate noted.   NECK:  Supple w/ fair ROM; no JVD; normal carotid impulses w/o bruits; no thyromegaly or nodules palpated; no lymphadenopathy.    RESP  Clear  P & A; w/o, wheezes/ rales/ or rhonchi. no accessory muscle use, no dullness to percussion  CARD:  RRR, no m/r/g, no peripheral edema, pulses intact, no cyanosis or clubbing.  GI:   Soft & nt; nml bowel sounds; no organomegaly or masses detected.   Musco: Warm bil, no deformities or joint swelling noted.   Neuro: alert, no focal deficits noted.    Skin: Warm, no lesions or rashes    Lab Results:     Imaging: No results found.    No flowsheet data found.  No results found for: NITRICOXIDE      Assessment & Plan:   No problem-specific Assessment & Plan notes found for this encounter.     [name], NP 01/04/2020

## 2020-01-04 NOTE — Addendum Note (Signed)
Addended by: Binnie Rail on: 01/04/2020 07:41 AM   Modules accepted: Orders

## 2020-01-05 NOTE — Telephone Encounter (Signed)
Called and spoke with pt due to needing to reschedule her OV and PFT which was originally scheduled on 12/9 due to having a scheduling conflict. Pt's appts have now been rescheduled for 02/15/20.  While speaking with pt, pt was asking if she could get CT scheduled. An order was placed when pt last saw Dr. Tamala Julian and the scan was originally scheduled but then she had to cancel that scan. Pt OV with TP yesterday 11/8 and TP stated for pt to have CT scheduled.  PCCs, please advise if the order from Dr. Tamala Julian is okay to be used or if we should place new order under TP.

## 2020-01-05 NOTE — Telephone Encounter (Signed)
Pt was originally scheduled for CT at [facility] in August.  I called them to reschedule and had to leave a vm.  Will route message back to triage so nurse can make pt aware I left a vm for [facility] to call pt to reschedule.  PT can also call them at [phone].  I will give to [facility] to make sure that authorization received back in August is still good.

## 2020-01-05 NOTE — Assessment & Plan Note (Signed)
Dysphagia.  Patient has chronic dysphagia with difficulty swallowing with frequent coughing.  Feeling the sensation that food will not go all the way down. Patient is continue on her PPI.  We will add in Pepcid at bedtime.  Aspiration precautions discussed.  Refer to GI.

## 2020-01-05 NOTE — Assessment & Plan Note (Signed)
Abnormal CT chest with left apex soft tissue density.  Needs serial follow-up.  CT chest is pending

## 2020-01-05 NOTE — Assessment & Plan Note (Signed)
COPD and emphysema.  Needs formal PFTs.  For now we will continue on Anoro.  Add in trigger prevention for chronic cough with Mucinex Delsym.  Control for GERD and postnasal drainage. Patient has ongoing dysphagia which may be contributed to her chronic cough as well.  Will refer to GI for further evaluation  Plan  Patient Instructions  Set up for CT chest .  Continue on ANORO 1 puff daily , rinse after use.  Albuterol inhaler or Duoneb As needed  Wheezing .  Activity as tolerated.  Mucinex Twice daily  As needed  Cough/congestiion  Delsym 2 tsp Twice daily  As needed  Cough.  Continue on Zyrtec daily  Continue on Protonix 40mg  daily .  Add Pepcid 20mg  At bedtime   Refer to GI for dysphagia.  Follow up with Dr Silas Flood in 2-3 weeks with PFT and As needed   Please contact office for sooner follow up if symptoms do not improve or worsen or seek emergency care

## 2020-01-07 ENCOUNTER — Encounter: Payer: Self-pay | Admitting: Internal Medicine

## 2020-01-19 NOTE — Progress Notes (Signed)
Subjective:    Patient ID: Kerry Powell, female    DOB: Jan 17, 1946, 74 y.o.   MRN: [id number]  HPI The patient is here for follow up of their chronic medical problems, including DM, htn, hyperlipidemia, CKD, gout, gerd, depression, h/o CVA, diffuse myalgias  She is taking all of her medications as prescribed.   She is not exercising regularly.     She still has muscle spasms  Medications and allergies reviewed with patient and updated if appropriate.  Patient Active Problem List   Diagnosis Date Noted  . Polyarthralgia 12/07/2019  . Myalgia 12/07/2019  . Anemia 12/07/2019  . Fatigue 12/07/2019  . CAD (coronary artery disease), native coronary artery   . Pleural plaque 04/27/2019  . Spasm of thoracic back muscle 11/14/2018  . SOB (shortness of breath) 04/17/2018  . Vitamin D deficiency 03/12/2018  . Thoracic aortic aneurysm ([place]) 02/06/2018  . Sleep difficulties 09/09/2017  . Depression 07/01/2017  . Dizziness 06/10/2017  . H/O renal cell carcinoma 05/23/2017  . Hyperlipidemia 06/25/2016  . Occipital neuralgia 05/09/2016  . Anxiety 07/26/2015  . Chronic neck pain 04/30/2015  . Diabetes ([place]) 04/27/2015  . History of CVA (cerebrovascular accident) 04/26/2015  . History of gout 04/26/2015  . CKD (chronic kidney disease) 04/26/2015  . Essential hypertension, benign 04/26/2015  . Snoring 04/26/2015  . Localized edema 04/26/2015  . Dysphagia 04/26/2015  . GERD (gastroesophageal reflux disease) 04/26/2015  . Personal history of colonic adenoma 05/29/2007  . HIATAL HERNIA 04/07/2007  . RESTLESS LEG SYNDROME 04/04/2007  . 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 COPD (chronic obstructive pulmonary disease) ([place]) 04/04/2007  . IRRITABLE BOWEL SYNDROME 04/04/2007  . OSTEOARTHRITIS 04/04/2007    Current Outpatient Medications on File Prior to Visit  Medication Sig Dispense Refill  . ACETAMINOPHEN-BUTALBITAL 50-325 MG TABS TAKE 1 TABLET BY MOUTH EVERY DAY AS NEEDED  (Patient taking differently: TAKE 1 TABLET by mouth once a week) 120 tablet 3  . allopurinol (ZYLOPRIM) 300 MG tablet Take 1 tablet (300 mg total) by mouth daily. 90 tablet 1  . amLODipine (NORVASC) 5 MG tablet Take 1.5 tablets (7.5 mg total) by mouth daily. 135 tablet 1  . cetirizine (ZYRTEC ALLERGY) 10 MG tablet Take 1 tablet (10 mg total) by mouth daily. 30 tablet 11  . cholecalciferol (VITAMIN D3) 25 MCG (1000 UNIT) tablet Take 2,000 Units by mouth daily.    . clopidogrel (PLAVIX) 75 MG tablet TAKE 1 TABLET BY MOUTH EVERY DAY 90 tablet 1  . Dextromethorphan Polistirex (DELSYM PO) Take by mouth.    . DULoxetine (CYMBALTA) 60 MG capsule TAKE 1 CAPSULE BY MOUTH EVERY DAY 90 capsule 1  . famotidine (PEPCID) 20 MG tablet Take 20 mg by mouth at bedtime.    . ferrous sulfate 325 (65 FE) MG tablet Take 325 mg by mouth daily with breakfast.    . fluticasone (FLONASE) 50 MCG/ACT nasal spray Place 2 sprays into both nostrils daily. 11.1 mL 11  . furosemide (LASIX) 20 MG tablet Take 20 mg by mouth. 3 times a week as needed for swelling    . ipratropium-albuterol (DUONEB) 0.5-2.5 (3) MG/3ML SOLN Take 3 mLs by nebulization 4 (four) times daily. 360 mL 5  . metFORMIN (GLUCOPHAGE) 500 MG tablet Take 1 tablet (500 mg total) by mouth 2 (two) times daily with a meal. 180 tablet 3  . methocarbamol (ROBAXIN) 500 MG tablet Take 1 tablet (500 mg total) by mouth 2 (two) times daily as needed for muscle spasms.  60 tablet 5  . metoprolol succinate (TOPROL-XL) 25 MG 24 hr tablet TAKE 1 TABLET BY MOUTH EVERY DAY 90 tablet 1  . metoprolol succinate (TOPROL-XL) 50 MG 24 hr tablet Take 50 mg by mouth daily.    . montelukast (SINGULAIR) 10 MG tablet Take 1 tablet (10 mg total) by mouth at bedtime. 30 tablet 11  . pantoprazole (PROTONIX) 40 MG tablet TAKE 1 TABLET BY MOUTH EVERY DAY 90 tablet 1  . Phenylephrine-DM-GG (MUCINEX FAST-MAX CONGEST COUGH PO) Take by mouth.    . rosuvastatin (CRESTOR) 40 MG tablet Take 1 tablet (40  mg total) by mouth daily. 90 tablet 3  . telmisartan (MICARDIS) 40 MG tablet Take 40 mg by mouth daily.    [name] umeclidinium-vilanterol (ANORO ELLIPTA) 62.5-25 MCG/INH AEPB Inhale 1 puff into the lungs daily. 60 each 5  . cyclobenzaprine (FLEXERIL) 5 MG tablet TAKE 1 TABLET BY MOUTH THREE TIMES A DAY AS NEEDED FOR MUSCLE SPASMS (NON FORMULARY) (Patient not taking: Reported on 01/20/2020) 270 tablet 1   No current facility-administered medications on file prior to visit.    Past Medical History:  Diagnosis Date  . Anxiety   . Arthritis   . BACK PAIN, CHRONIC 04/04/2007   Qualifier: Diagnosis of  By: Carlean Purl MD, Dimas Millin BIPOLAR AFFECTIVE DISORDER 04/04/2007   Qualifier: History of  By: Carlean Purl MD, Dimas Millin   . CAD (coronary artery disease), native coronary artery    mild CAD with no obstructive disease by coronary CTA 06/2019  . Cataract    removed both eyes  . Chronic neck pain 04/30/2015   Cervical spondylosis [facility] Intermittent numbness and tingling in occipital region and headaches S/p 2 occipital nerve blocks - only temp relief no relief with gabapentin, ultram, brace MRI neck - facet arthrosis   . CKD (chronic kidney disease) 04/26/2015  . COPD (chronic obstructive pulmonary disease) ([place])    pt denies - told this while was smoking -  . Cough 04/26/2015  . Depression 07/01/2017  . Diabetes ([place]) 04/27/2015   Diagnosed 03/2015   . Dizziness 06/10/2017  . Dysphagia 04/26/2015  . DYSPNEA 12/24/2007   Qualifier: Diagnosis of  By: Glennis Brink    . Elevated TSH 08/09/2015  . GASTRIC ULCER, ACUTE 04/07/2007   Annotation: EGD 04/07/07 Qualifier: Diagnosis of  By: Carlean Purl MD, FACG, [place] 04/07/2007   Annotation: EGD 04/07/07 Qualifier: Diagnosis of  By: Carlean Purl MD, Dimas Millin   . GERD (gastroesophageal reflux disease)   . H/O renal cell carcinoma 05/23/2017   Dx 2010, s/p cryoablation Follows with urology - Dr Lovena Neighbours  . HIATAL HERNIA 04/07/2007    Annotation: EGD 04/07/07 Qualifier: Diagnosis of  By: Carlean Purl MD, Dimas Millin History of CVA (cerebrovascular accident) 04/26/2015  . History of gout 04/26/2015  . Hyperlipidemia   . Hypertension   . Irritable bowel syndrome 04/04/2007   Qualifier: Diagnosis of  By: Carlean Purl MD, Dimas Millin Kidney tumor (benign)   . Nausea and vomiting 09/09/2017  . 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 OSTEOARTHRITIS 04/04/2007   Qualifier: Diagnosis of  By: Carlean Purl MD, Dimas Millin Personal history of colonic adenoma 06/07/2012  . POSTNASAL DRIP SYNDROME 12/29/2007   Qualifier: Diagnosis of  By: Chase Caller MD, [name]    . RESTLESS LEG SYNDROME 04/04/2007   Qualifier:  Diagnosis of  By: Carlean Purl MD, Cindra Presume, 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   Qualifier: Diagnosis of  By: Carlean Purl MD, Dimas Millin Sleep difficulties 09/09/2017  . Snoring 04/26/2015  . Stroke [facility])    x2 2011  . Thoracic aortic aneurysm ([place]) 02/06/2018   Seen on MRI 01/25/18 of thoracic spine ordered by [facility] -- referred to CTS to monitor  -- 3.5 cm  . TIA (transient ischemic attack)   . TOBACCO ABUSE 12/17/2007   Qualifier: Diagnosis of  By: Chase Caller MD, [name]    . Trigeminal neuralgia of right side of face 02/08/2016  . URI (upper respiratory infection) 02/08/2016  . Vitamin D deficiency 03/12/2018    Past Surgical History:  Procedure Laterality Date  . ABDOMINAL HYSTERECTOMY    . CATARACT EXTRACTION    . COLONOSCOPY    . CYSTOCELE REPAIR    . ESOPHAGOGASTRODUODENOSCOPY    . KIDNEY SURGERY     benign tumor removal  . ORTHOPEDIC SURGERY     Wrist & elbow  . POLYPECTOMY    . RECTOCELE REPAIR    . UPPER GASTROINTESTINAL ENDOSCOPY      Social History   Socioeconomic History  . Marital status: Married    Spouse name: Not on file  . Number of children: 2  . Years of education: Not on file  . Highest education level: Not on file  Occupational  History  . Occupation: Retired  Tobacco Use  . Smoking status: Former Smoker    Packs/day: 1.00    Years: 46.00    Pack years: 46.00    Quit date: 06/30/2009    Years since quitting: 10.5  . Smokeless tobacco: Never Used  Vaping Use  . Vaping Use: Never used  Substance and Sexual Activity  . Alcohol use: No  . Drug use: No  . Sexual activity: Not on file  Other Topics Concern  . Not on file  Social History Narrative   Married, retired   1 son 1 daughter   2 caffeine/day   Social Determinants of Health   Financial Resource Strain:   . Difficulty of Paying Living Expenses: Not on file  Food Insecurity:   . Worried About Charity fundraiser in the Last Year: Not on file  . Ran Out of Food in the Last Year: Not on file  Transportation Needs:   . Lack of Transportation (Medical): Not on file  . Lack of Transportation (Non-Medical): Not on file  Physical Activity:   . Days of Exercise per Week: Not on file  . Minutes of Exercise per Session: Not on file  Stress:   . Feeling of Stress : Not on file  Social Connections:   . Frequency of Communication with Friends and Family: Not on file  . Frequency of Social Gatherings with Friends and Family: Not on file  . Attends Religious Services: Not on file  . Active Member of Clubs or Organizations: Not on file  . Attends Archivist Meetings: Not on file  . Marital Status: Not on file    Family History  Problem Relation Age of Onset  . Congestive Heart Failure Mother   . Cancer Mother        ovarian  . Heart disease Father   . Diabetes Father   . Kidney disease Father   . Diabetes Sister   . Kidney disease Sister   . Colon cancer Neg Hx   . Colon  polyps Neg Hx   . Esophageal cancer Neg Hx   . Rectal cancer Neg Hx   . Stomach cancer Neg Hx     Review of Systems  Constitutional: Positive for fever. Negative for chills.  Respiratory: Positive for cough, shortness of breath and wheezing.   Cardiovascular:  Negative for chest pain, palpitations and leg swelling.  Musculoskeletal: Positive for arthralgias, back pain, myalgias and neck pain.  Neurological: Negative for light-headedness and headaches.       Objective:   Vitals:   01/20/20 1546  BP: 128/78  Pulse: 71  Temp: 98 F (36.7 C)  SpO2: 94%   BP Readings from Last 3 Encounters:  01/20/20 128/78  01/04/20 110/60  12/07/19 130/68   Wt Readings from Last 3 Encounters:  01/20/20 232 lb (105.2 kg)  01/04/20 233 lb 12.8 oz (106.1 kg)  12/07/19 233 lb (105.7 kg)   Body mass index is 38.61 kg/m.   Physical Exam    Constitutional: Appears well-developed and well-nourished. No distress.  HENT:  Head: Normocephalic and atraumatic.  Neck: Neck supple. No tracheal deviation present. No thyromegaly present.  No cervical lymphadenopathy Cardiovascular: Normal rate, regular rhythm and normal heart sounds.   No murmur heard. No carotid bruit .  No edema Pulmonary/Chest: Effort normal and breath sounds normal. No respiratory distress. No has no wheezes. No rales.  Skin: Skin is warm and dry. Not diaphoretic.  Psychiatric: Normal mood and affect. Behavior is normal.      Assessment & Plan:    See Problem List for Assessment and Plan of chronic medical problems.    This visit occurred during the SARS-CoV-2 public health emergency.  Safety protocols were in place, including screening questions prior to the visit, additional usage of staff PPE, and extensive cleaning of exam room while observing appropriate contact time as indicated for disinfecting solutions.

## 2020-01-19 NOTE — Patient Instructions (Addendum)
   Medications changes include :   Stop the glimepiride.   Increase pantoprazole to 40 mg twice daily.   Your prescription(s) have been submitted to your pharmacy. Please take as directed and contact our office if you believe you are having problem(s) with the medication(s).     Please followup in 6 months

## 2020-01-20 ENCOUNTER — Other Ambulatory Visit: Payer: Self-pay

## 2020-01-20 ENCOUNTER — Encounter: Payer: Self-pay | Admitting: Internal Medicine

## 2020-01-20 ENCOUNTER — Ambulatory Visit: Payer: Medicare Other | Admitting: Internal Medicine

## 2020-01-20 VITALS — BP 128/78 | HR 71 | Temp 98.0°F | Ht 65.0 in | Wt 232.0 lb

## 2020-01-20 DIAGNOSIS — F419 Anxiety disorder, unspecified: Secondary | ICD-10-CM

## 2020-01-20 DIAGNOSIS — E1122 Type 2 diabetes mellitus with diabetic chronic kidney disease: Secondary | ICD-10-CM

## 2020-01-20 DIAGNOSIS — R600 Localized edema: Secondary | ICD-10-CM

## 2020-01-20 DIAGNOSIS — R700 Elevated erythrocyte sedimentation rate: Secondary | ICD-10-CM | POA: Insufficient documentation

## 2020-01-20 DIAGNOSIS — K219 Gastro-esophageal reflux disease without esophagitis: Secondary | ICD-10-CM | POA: Diagnosis not present

## 2020-01-20 DIAGNOSIS — E7849 Other hyperlipidemia: Secondary | ICD-10-CM

## 2020-01-20 DIAGNOSIS — N1831 Chronic kidney disease, stage 3a: Secondary | ICD-10-CM

## 2020-01-20 DIAGNOSIS — I5189 Other ill-defined heart diseases: Secondary | ICD-10-CM

## 2020-01-20 DIAGNOSIS — F3289 Other specified depressive episodes: Secondary | ICD-10-CM

## 2020-01-20 DIAGNOSIS — I10 Essential (primary) hypertension: Secondary | ICD-10-CM | POA: Diagnosis not present

## 2020-01-20 DIAGNOSIS — Z8739 Personal history of other diseases of the musculoskeletal system and connective tissue: Secondary | ICD-10-CM

## 2020-01-20 MED ORDER — PANTOPRAZOLE SODIUM 40 MG PO TBEC
40.0000 mg | DELAYED_RELEASE_TABLET | Freq: Two times a day (BID) | ORAL | 1 refills | Status: DC
Start: 2020-01-20 — End: 2020-02-14

## 2020-01-20 NOTE — Assessment & Plan Note (Signed)
Chronic Last a1c 7.4% Having side effects with glimepiride - advised to stop - ? Hypoglycemia in afternoon Continue metformin 500 mg twice daily Will likely need a second medication - will see if our pharmacist can help [place] with a medication that is covered and less likely to cause hypoglycemia

## 2020-01-20 NOTE — Assessment & Plan Note (Signed)
Chronic Denies GERD ? Controlled or not - ? Silent GERD and aspiration Increase protonix to 40 mg [facility]

## 2020-01-20 NOTE — Assessment & Plan Note (Signed)
Chronic Stable, controlled Continue furosemide 20 mg 3 times a week as needed

## 2020-01-20 NOTE — Assessment & Plan Note (Signed)
Last blood work showed elevated ESR ANA and RF negative Discussed possible muscle inflammation Advised her to think about seeing a rheumatologist-at this time she does not want to

## 2020-01-20 NOTE — Assessment & Plan Note (Signed)
Chronic Controlled, stable Continue Cymbalta 60 mg daily  

## 2020-01-20 NOTE — Assessment & Plan Note (Addendum)
Chronic BP well controlled Continue amlodipine 7.5 mg daily,

## 2020-01-20 NOTE — Assessment & Plan Note (Signed)
Chronic Continue crestor 40 mg daily 

## 2020-01-20 NOTE — Assessment & Plan Note (Signed)
Chronic No issues with gout Continue allopurinol 300 mg daily

## 2020-01-25 ENCOUNTER — Ambulatory Visit: Payer: Medicare Other | Admitting: Internal Medicine

## 2020-01-25 ENCOUNTER — Encounter: Payer: Self-pay | Admitting: Internal Medicine

## 2020-01-28 ENCOUNTER — Telehealth: Payer: Self-pay | Admitting: Pharmacist

## 2020-01-28 ENCOUNTER — Encounter: Payer: Self-pay | Admitting: Internal Medicine

## 2020-01-28 NOTE — Telephone Encounter (Signed)
Patient reported to PCP that she is having trouble affording medications. Contacted patient via phone to discuss.  [facility]. BCBS Medicare [facility], so prescriptions will be cheaper at either of those pharmacies. Patient agreed to transfer rx's to [facility]. She will contact [facility] to [facility] by January 1st.  Pt reports butalbital-APAP ~$35-40 per fill. Per formulary this version is not covered, but the butalbital-APAP-caffeine version is covered at Tier 3, so the price would be the same anyway. Advised patient to use GoodRx for cost savings without insurance.  Patient also wanted to discuss fluconazole rx prescribed to her son Marshell Levan (she sent MyChart message to Dr Quay Burow about this today). Discussed indications for fluconazole and potential side effects -most common side effects are mild headache or GI upset. Assured patient that fluconazole treats more than yeast infections/sex-related infections.

## 2020-02-02 ENCOUNTER — Other Ambulatory Visit: Payer: Self-pay

## 2020-02-02 ENCOUNTER — Ambulatory Visit (INDEPENDENT_AMBULATORY_CARE_PROVIDER_SITE_OTHER)
Admission: RE | Admit: 2020-02-02 | Discharge: 2020-02-02 | Disposition: A | Discharge status: Home / Self Care | Payer: Medicare Other | Source: Ambulatory Visit | Attending: Internal Medicine | Admitting: Internal Medicine

## 2020-02-02 DIAGNOSIS — R918 Other nonspecific abnormal finding of lung field: Secondary | ICD-10-CM | POA: Diagnosis not present

## 2020-02-04 ENCOUNTER — Ambulatory Visit: Payer: Medicare Other | Admitting: Internal Medicine

## 2020-02-07 ENCOUNTER — Other Ambulatory Visit: Payer: Self-pay | Admitting: Internal Medicine

## 2020-02-12 ENCOUNTER — Other Ambulatory Visit: Payer: Self-pay | Admitting: Pulmonary Disease

## 2020-02-12 DIAGNOSIS — R1310 Dysphagia, unspecified: Secondary | ICD-10-CM

## 2020-02-12 NOTE — Progress Notes (Signed)
pft  

## 2020-02-14 ENCOUNTER — Encounter: Payer: Self-pay | Admitting: Internal Medicine

## 2020-02-14 MED ORDER — PANTOPRAZOLE SODIUM 40 MG PO TBEC: 40.0000 mg | DELAYED_RELEASE_TABLET | Freq: Two times a day (BID) | ORAL | 1 refills | Status: DC

## 2020-02-15 ENCOUNTER — Ambulatory Visit (INDEPENDENT_AMBULATORY_CARE_PROVIDER_SITE_OTHER): Payer: Medicare Other | Admitting: Internal Medicine

## 2020-02-15 ENCOUNTER — Ambulatory Visit: Payer: Medicare Other | Admitting: Internal Medicine

## 2020-02-15 ENCOUNTER — Other Ambulatory Visit: Payer: Self-pay

## 2020-02-15 ENCOUNTER — Encounter: Payer: Self-pay | Admitting: Internal Medicine

## 2020-02-15 VITALS — BP 136/82 | HR 85 | Temp 98.1°F | Ht 65.0 in | Wt 230.6 lb

## 2020-02-15 DIAGNOSIS — R1310 Dysphagia, unspecified: Secondary | ICD-10-CM

## 2020-02-15 DIAGNOSIS — J410 Simple chronic bronchitis: Secondary | ICD-10-CM | POA: Diagnosis not present

## 2020-02-15 DIAGNOSIS — R053 Chronic cough: Secondary | ICD-10-CM | POA: Diagnosis not present

## 2020-02-15 DIAGNOSIS — K219 Gastro-esophageal reflux disease without esophagitis: Secondary | ICD-10-CM | POA: Diagnosis not present

## 2020-02-15 LAB — PULMONARY FUNCTION TEST
DL/VA % pred: 95 %
DL/VA: 3.9 ml/min/mmHg/L
DLCO cor % pred: 80 %
DLCO cor: 15.98 ml/min/mmHg
DLCO unc % pred: 80 %
DLCO unc: 15.98 ml/min/mmHg
FEF 25-75 Post: 2.2 L/sec
FEF 25-75 Pre: 1.39 L/sec
FEF2575-%Change-Post: 58 %
FEF2575-%Pred-Post: 124 %
FEF2575-%Pred-Pre: 78 %
FEV1-%Change-Post: 11 %
FEV1-%Pred-Post: 98 %
FEV1-%Pred-Pre: 88 %
FEV1-Post: 2.2 L
FEV1-Pre: 1.97 L
FEV1FVC-%Change-Post: 5 %
FEV1FVC-%Pred-Pre: 97 %
FEV6-%Change-Post: 5 %
FEV6-%Pred-Post: 98 %
FEV6-%Pred-Pre: 93 %
FEV6-Post: 2.79 L
FEV6-Pre: 2.64 L
FEV6FVC-%Change-Post: 0 %
FEV6FVC-%Pred-Post: 103 %
FEV6FVC-%Pred-Pre: 103 %
FVC-%Change-Post: 5 %
FVC-%Pred-Post: 94 %
FVC-%Pred-Pre: 90 %
FVC-Post: 2.82 L
FVC-Pre: 2.68 L
Post FEV1/FVC ratio: 78 %
Post FEV6/FVC ratio: 99 %
Pre FEV1/FVC ratio: 74 %
Pre FEV6/FVC Ratio: 98 %
RV % pred: 85 %
RV: 1.98 L
TLC % pred: 93 %
TLC: 4.86 L

## 2020-02-15 NOTE — Progress Notes (Signed)
Kerry Powell    [id number]    03-10-1945  Primary Care Physician:Burns, [name], MD Date of Appointment: 02/15/2020 Established Patient Visit  Chief complaint:   Chief Complaint  Patient presents with  . Follow-up    Dry cough unchanged, PFT review      HPI: Kerry Powell is a 74 y.o. woman with COPD on anoro as well as bi-apical parenchymal scarring (stable on CT)  Interval Updates: Here for follow up after PFTs. On Anoro once a day. She runs out of breath easily and especially has trouble with coughing while eating. Recently doubled her PPI to BID and that is helping her morning cough. She takes nebulizer in the morning which helps. Gets sob with vaccuming, daily activities such as making the bed, laundry.   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   I have reviewed the patient's family social and past medical history and updated as appropriate.   Past Medical History:  Diagnosis Date  . Anxiety   . Arthritis   . BACK PAIN, CHRONIC 04/04/2007   Qualifier: Diagnosis of  By: Carlean Purl MD, Dimas Millin BIPOLAR AFFECTIVE DISORDER 04/04/2007   Qualifier: History of  By: Carlean Purl MD, Dimas Millin   . CAD (coronary artery disease), native coronary artery    mild CAD with no obstructive disease by coronary CTA 06/2019  . Cataract    removed both eyes  . Chronic neck pain 04/30/2015   Cervical spondylosis [place] [facility] Intermittent numbness and tingling in occipital region and headaches S/p 2 occipital nerve blocks - only temp relief no relief with gabapentin, ultram, brace MRI neck - facet arthrosis   . CKD (chronic kidney disease) 04/26/2015  . COPD (chronic obstructive pulmonary disease) ([place])    pt denies - told this while was smoking -  . Cough 04/26/2015  . Depression 07/01/2017  . Diabetes ([place]) 04/27/2015   Diagnosed 03/2015   . Dizziness 06/10/2017  . Dysphagia 04/26/2015  . DYSPNEA 12/24/2007   Qualifier:  Diagnosis of  By: Glennis Brink    . Elevated TSH 08/09/2015  . GASTRIC ULCER, ACUTE 04/07/2007   Annotation: EGD 04/07/07 Qualifier: Diagnosis of  By: Carlean Purl MD, FACG, [place] 04/07/2007   Annotation: EGD 04/07/07 Qualifier: Diagnosis of  By: Carlean Purl MD, Dimas Millin   . GERD (gastroesophageal reflux disease)   . H/O renal cell carcinoma 05/23/2017   Dx 2010, s/p cryoablation Follows with urology - Dr Lovena Neighbours  . HIATAL HERNIA 04/07/2007   Annotation: EGD 04/07/07 Qualifier: Diagnosis of  By: Carlean Purl MD, Dimas Millin History of CVA (cerebrovascular accident) 04/26/2015  . History of gout 04/26/2015  . Hyperlipidemia   . Hypertension   . Irritable bowel syndrome 04/04/2007   Qualifier: Diagnosis of  By: Carlean Purl MD, Dimas Millin Kidney tumor (benign)   . Nausea and vomiting 09/09/2017  . 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 OSTEOARTHRITIS 04/04/2007   Qualifier: Diagnosis of  By: Carlean Purl MD, Dimas Millin Personal history of colonic adenoma 06/07/2012  . POSTNASAL DRIP SYNDROME 12/29/2007   Qualifier: Diagnosis of  By: Chase Caller MD, [name]    . RESTLESS LEG SYNDROME 04/04/2007   Qualifier: Diagnosis of  By: Carlean Purl MD, Cindra Presume,  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   Qualifier: Diagnosis of  By: Carlean Purl MD, Dimas Millin Sleep difficulties 09/09/2017  . Snoring 04/26/2015  . Stroke [facility])    x2 2011  . Thoracic aortic aneurysm ([place]) 02/06/2018   Seen on MRI 01/25/18 of thoracic spine [facility] -- referred to CTS to monitor  -- 3.5 cm  . TIA (transient ischemic attack)   . TOBACCO ABUSE 12/17/2007   Qualifier: Diagnosis of  By: Chase Caller MD, [name]    . Trigeminal neuralgia of right side of face 02/08/2016  . URI (upper respiratory infection) 02/08/2016  . Vitamin D deficiency 03/12/2018    Past Surgical History:  Procedure Laterality Date  . ABDOMINAL HYSTERECTOMY    . CATARACT EXTRACTION     . COLONOSCOPY    . CYSTOCELE REPAIR    . ESOPHAGOGASTRODUODENOSCOPY    . KIDNEY SURGERY     benign tumor removal  . ORTHOPEDIC SURGERY     Wrist & elbow  . POLYPECTOMY    . RECTOCELE REPAIR    . UPPER GASTROINTESTINAL ENDOSCOPY      Family History  Problem Relation Age of Onset  . Congestive Heart Failure Mother   . Cancer Mother        ovarian  . Heart disease Father   . Diabetes Father   . Kidney disease Father   . Diabetes Sister   . Kidney disease Sister   . Colon cancer Neg Hx   . Colon polyps Neg Hx   . Esophageal cancer Neg Hx   . Rectal cancer Neg Hx   . Stomach cancer Neg Hx     Social History   Occupational History  . Occupation: Retired  Tobacco Use  . Smoking status: Former Smoker    Packs/day: 1.00    Years: 46.00    Pack years: 46.00    Quit date: 06/30/2009    Years since quitting: 10.6  . Smokeless tobacco: Never Used  Vaping Use  . Vaping Use: Never used  Substance and Sexual Activity  . Alcohol use: No  . Drug use: No  . Sexual activity: Not on file     Physical Exam: Blood pressure 136/82, pulse 85, temperature 98.1 F (36.7 C), height 5\' 5"  (1.651 m), weight 230 lb 9.6 oz (104.6 kg), SpO2 95 %.  Gen:      No acute distress ENT:  no nasal polyps, mucus membranes moist Lungs:    No increased respiratory effort, symmetric chest wall excursion, clear to auscultation bilaterally, no wheezes or crackles CV:         Regular rate and rhythm; no murmurs, rubs, or gallops.  No pedal edema   Data Reviewed: Imaging: I have personally reviewed the CT Chest done dec 2021 which shows stable bi-apical scarring and severe centrilobular emphysema  PFTs:  PFT Results Latest Ref Rng & Units 02/15/2020  FVC-Pre L 2.68  FVC-Predicted Pre % 90  FVC-Post L 2.82  FVC-Predicted Post % 94  Pre FEV1/FVC % % 74  Post FEV1/FCV % % 78  FEV1-Pre L 1.97  FEV1-Predicted Pre % 88  FEV1-Post L 2.20  DLCO uncorrected ml/min/mmHg 15.98  DLCO UNC% % 80  DLCO  corrected ml/min/mmHg 15.98  DLCO COR %Predicted % 80  DLVA Predicted % 95  TLC L 4.86  TLC % Predicted % 93  RV % Predicted % 85   I have personally reviewed the patient's PFTs and they show mild airflow limitation with normal lung  volumes and diffusion capacity.  Labs:  Immunization status: Immunization History  Administered Date(s) Administered  . PFIZER SARS-COV-2 Vaccination 05/01/2019, 05/27/2019    Assessment:  COPD with mild airflow limitation History of tobacco use disorder GERD with chronic cough Dysphagia  Plan/Recommendations: On anoro, continue this once daily. Continue duonebs as needed can take before activity and excerise to help tolerance  Agree with following up with GI about dysphagia symptoms. Needs to keep taking BID PPI.    Return to Care: Return in about 4 months (around 06/15/2020).   Lenice Llamas, MD Pulmonary and [place]

## 2020-02-15 NOTE — Progress Notes (Signed)
PFT done today. 

## 2020-02-15 NOTE — Patient Instructions (Addendum)
The patient should have follow up scheduled with myself in 4 months.   Follow up with gastroenterologist.  Keep taking acid reflux medicine twice a day  What is GERD? Gastroesophageal reflux disease (GERD) is gastroesophageal reflux diseasewhich occurs when the lower esophageal sphincter (LES) opens spontaneously, for varying periods of time, or does not close properly and stomach contents rise up into the esophagus. GER is also called acid reflux or acid regurgitation, because digestive juices--called acids--rise up with the food. The esophagus is the tube that carries food from the mouth to the stomach. The LES is a ring of muscle at the bottom of the esophagus that acts like a valve between the esophagus and stomach.  When acid reflux occurs, food or fluid can be tasted in the back of the mouth. When refluxed stomach acid touches the lining of the esophagus it may cause a burning sensation in the chest or throat called heartburn or acid indigestion. Occasional reflux is common. Persistent reflux that occurs more than twice a week is considered GERD, and it can eventually lead to more serious health problems. People of all ages can have GERD. Studies have shown that GERD may worsen or contribute to asthma, chronic cough, and pulmonary fibrosis.   What are the symptoms of GERD? The main symptom of GERD in adults is frequent heartburn, also called acid indigestion--burning-type pain in the lower part of the mid-chest, behind the breast bone, and in the mid-abdomen.  Not all reflux is acidic in nature, and many patients don't have heart burn at all. Sometimes it feels like a cough (either dry or with mucus), choking sensation, asthma, shortness of breath, waking up at night, frequent throat clearing, or trouble swallowing.    What causes GERD? The reason some people develop GERD is still unclear. However, research shows that in people with GERD, the LES relaxes while the rest of the esophagus is  working. Anatomical abnormalities such as a hiatal hernia may also contribute to GERD. A hiatal hernia occurs when the upper part of the stomach and the LES move above the diaphragm, the muscle wall that separates the stomach from the chest. Normally, the diaphragm helps the LES keep acid from rising up into the esophagus. When a hiatal hernia is present, acid reflux can occur more easily. A hiatal hernia can occur in people of any age and is most often a normal finding in otherwise healthy people over age 15. Most of the time, a hiatal hernia produces no symptoms.   Other factors that may contribute to GERD include - Obesity or recent weight gain - Pregnancy  - Smoking  - Diet - Certain medications  Common foods that can worsen reflux symptoms include: - carbonated beverages - artificial sweeteners - citrus fruits  - chocolate  - drinks with caffeine or alcohol  - fatty and fried foods  - garlic and onions  - mint flavorings  - spicy foods  - tomato-based foods, like spaghetti sauce, salsa, chili, and pizza   Lifestyle Changes If you smoke, stop.  Avoid foods and beverages that worsen symptoms (see above.) Lose weight if needed.  Eat small, frequent meals.  Wear loose-fitting clothes.  Avoid lying down for 3 hours after a meal.  Raise the head of your bed 6 to 8 inches by securing wood blocks under the bedposts. Just using extra pillows will not help, but using a wedge-shaped pillow may be helpful.  Medications  H2 blockers, such as cimetidine (Tagamet HB), famotidine (Pepcid  AC), nizatidine (Axid AR), and ranitidine (Zantac 75), decrease acid production. They are available in prescription strength and over-the-counter strength. These drugs provide short-term relief and are effective for about half of those who have GERD symptoms.  Proton pump inhibitors include omeprazole (Prilosec, Zegerid), lansoprazole (Prevacid), pantoprazole (Protonix), rabeprazole (Aciphex), and esomeprazole  (Nexium), which are available by prescription. Prilosec is also available in over-the-counter strength. Proton pump inhibitors are more effective than H2 blockers and can relieve symptoms and heal the esophageal lining in almost everyone who has GERD.  Because drugs work in different ways, combinations of medications may help control symptoms. People who get heartburn after eating may take both antacids and H2 blockers. The antacids work first to neutralize the acid in the stomach, and then the H2 blockers act on acid production. By the time the antacid stops working, the H2 blocker will have stopped acid production. Your health care provider is the best source of information about how to use medications for GERD.   Points to Remember 1. You can have GERD without having heartburn. Your symptoms could include a dry cough, asthma symptoms, or trouble swallowing.  2. Taking medications daily as prescribed is important in controlling you symptoms.  Sometimes it can take up to 8 weeks to fully achieve the effects of the medications prescribed.  3. Coughing related to GERD can be difficult to treat and is very frustrating!  However, it is important to stick with these medications and lifestyle modifications before pursuing more aggressive or invasive test and treatments.

## 2020-03-02 ENCOUNTER — Encounter: Payer: Self-pay | Admitting: Internal Medicine

## 2020-03-07 ENCOUNTER — Other Ambulatory Visit (INDEPENDENT_AMBULATORY_CARE_PROVIDER_SITE_OTHER): Payer: Medicare Other

## 2020-03-07 ENCOUNTER — Encounter: Payer: Self-pay | Admitting: Internal Medicine

## 2020-03-07 ENCOUNTER — Ambulatory Visit: Payer: Medicare Other | Admitting: Internal Medicine

## 2020-03-07 ENCOUNTER — Telehealth: Payer: Self-pay

## 2020-03-07 VITALS — BP 114/64 | HR 67 | Ht 65.0 in | Wt 231.0 lb

## 2020-03-07 DIAGNOSIS — I77810 Thoracic aortic ectasia: Secondary | ICD-10-CM

## 2020-03-07 DIAGNOSIS — D509 Iron deficiency anemia, unspecified: Secondary | ICD-10-CM

## 2020-03-07 DIAGNOSIS — E669 Obesity, unspecified: Secondary | ICD-10-CM

## 2020-03-07 DIAGNOSIS — R1310 Dysphagia, unspecified: Secondary | ICD-10-CM

## 2020-03-07 DIAGNOSIS — R159 Full incontinence of feces: Secondary | ICD-10-CM | POA: Diagnosis not present

## 2020-03-07 DIAGNOSIS — Z86010 Personal history of colonic polyps: Secondary | ICD-10-CM

## 2020-03-07 DIAGNOSIS — Z7902 Long term (current) use of antithrombotics/antiplatelets: Secondary | ICD-10-CM

## 2020-03-07 LAB — CBC WITH DIFFERENTIAL/PLATELET
Basophils Absolute: 0 10*3/uL (ref 0.0–0.1)
Basophils Relative: 0.5 % (ref 0.0–3.0)
Eosinophils Absolute: 0.3 10*3/uL (ref 0.0–0.7)
Eosinophils Relative: 3.5 % (ref 0.0–5.0)
HCT: 40.4 % (ref 36.0–46.0)
Hemoglobin: 13.1 g/dL (ref 12.0–15.0)
Lymphocytes Relative: 28.3 % (ref 12.0–46.0)
Lymphs Abs: 2.4 10*3/uL (ref 0.7–4.0)
MCHC: 32.4 g/dL (ref 30.0–36.0)
MCV: 88.6 fl (ref 78.0–100.0)
Monocytes Absolute: 0.8 10*3/uL (ref 0.1–1.0)
Monocytes Relative: 9.1 % (ref 3.0–12.0)
Neutro Abs: 5 10*3/uL (ref 1.4–7.7)
Neutrophils Relative %: 58.6 % (ref 43.0–77.0)
Platelets: 283 10*3/uL (ref 150.0–400.0)
RBC: 4.56 Mil/uL (ref 3.87–5.11)
RDW: 24.1 % — ABNORMAL HIGH (ref 11.5–15.5)
WBC: 8.5 10*3/uL (ref 4.0–10.5)

## 2020-03-07 NOTE — Patient Instructions (Addendum)
Your provider has requested that you go to the basement level for lab work before leaving today. Press "B" on the elevator. The lab is located at the first door on the left as you exit the elevator.  Due to recent changes in healthcare laws, you may see the results of your imaging and laboratory studies on MyChart before your provider has had a chance to review them.  We understand that in some cases there may be results that are confusing or concerning to you. Not all laboratory results come back in the same time frame and the provider may be waiting for multiple results in order to interpret others.  Please give [place] 48 hours in order for your provider to thoroughly review all the results before contacting the office for clarification of your results.     You will be contacted by our office prior to your procedure for directions on holding your Plavix.  If you do not hear from our office 1 week prior to your scheduled procedure, please call [phone] to discuss.   You have been scheduled for an endoscopy and colonoscopy. Please follow the written instructions given to you at your visit today. Please pick up your prep supplies at the pharmacy within the next 1-3 days. If you use inhalers (even only as needed), please bring them with you on the day of your procedure.  I appreciate the opportunity to care for you. Silvano Rusk, MD, [facility]

## 2020-03-07 NOTE — Progress Notes (Signed)
Kerry Powell 75 y.o. 1945-07-14 [id number] Referred by: [name], NP  Assessment & Plan:   Encounter Diagnoses  Name Primary?  Kerry Powell Dysphagia, unspecified type Yes  . Ascending aorta dilatation (HCC)   . Iron deficiency anemia, unspecified iron deficiency anemia type   . Incontinence of feces, unspecified fecal incontinence type   . Antiplatelet or antithrombotic long-term use clopidogrel   . History of adenomatous polyp of colon   . Obesity, Class II, BMI 35-39.9    She needs an EGD and colonoscopy to evaluate the above problems.  Gastrointestinal malignancy is in the differential.  Dysphagia possibly related to her aortic dilation I had considered a barium swallow but with the iron deficiency she needs an EGD as well as a colonoscopy but will keep barium swallow in reserve for now.   She has an increased risk of bleeding if we do the procedure on clopidogrel and some rare but real low risk of stroke off the clopidogrel.  I think it is prudent to hold the clopidogrel prior to her evaluation with possible esophageal dilation and colonoscopy polypectomy.  She understands the risks benefits and indications and agrees to proceed.  Orders Placed This Encounter  Procedures  . CBC with Differential/Platelet  . Ambulatory referral to Gastroenterology    Regarding her fecal incontinence she clearly has decreased anal tone.  I am not sure how fixable this is but once the colonoscopy and upper endoscopy are complete we can consider options that are good including pelvic floor physical therapy though I think she may need an MR defecography plus or minus anorectal manometry.  Reassessment by urology and possible referral to urogynecology could be helpful as well.  Obesity is a contributing factor to many of her health conditions.  I feel certain she has metabolic syndrome with abdominal adiposity.  Once GI work-up complete as above I plan to have some discussion with her about low  carbohydrate eating and consideration for restricted feeding/intermittent fasting.  I appreciate the opportunity to care for this patient. CC: Burns, Kerry Lick, MD [name], NP Dr. [name]  Subjective:   Chief Complaint: Dysphagia and fecal incontinence HPI The patient is a 75 year old woman with a history of obesity, occipital and trigeminal neuralgia, COPD, chronic kidney disease and a history of bilateral strokes on clopidogrel who is presenting with recurrent dysphagia issues.  I had last seen her in 2017 and performed an EGD with [name] dilation [address] [place] due to prominent cricopharyngeal area on a barium swallow ordered for dysphagia.  She does not really recall the details of that she is back now with recurrent dysphagia, she cannot remember how much she was helped by the previous procedure.  She is having problems with strangling on liquids and solids.  She has been diagnosed with iron deficiency anemia in the last couple of months.  She has not noted any bleeding other than rare bleeding with wiping.  However she does have what she thinks is a recurrent rectal prolapse.  She has a history of having kidney cancer and then also repair of cystocele rectocele and rectal prolapse by Dr. [name].  Currently followed by Dr. [name].  She believes she is having a prolapsed rectum again as she can feel it come out with defecation at times and she manually reduces it.  Is not really painful.  Bleeding is rare.  She struggles with fecal incontinence that is often related to urgent sudden defecation after meals.  She has  noticed a change in the way the sound of gas emptying from her rectum occurs as well.  She does have a history of having had most of her teeth pulled she has dentures that do not fit well and she is trying to get that sorted out.  She is aware this could cause some difficulty with her swallowing.  As far as iron deficiency she is not a blood donor she has not seen  lots of visible bleeding.  She does eat green vegetables it is been somewhat difficult eating meat lately due to her denture problems.  Her GI review of systems is otherwise negative.\She was started on ferrous sulfate.  Stools are a bit dark on that but no melena.  Last colonoscopy 2009 with 1 adenoma and 1 hyperplastic polyp  She also had an elevated sed rate though ANA and RF were negative.  She does not get muscle fatigue or signs or symptoms of polymyalgia rheumatica as best I can tell.  No other muscle weaknesses. Wt Readings from Last 3 Encounters:  03/07/20 231 lb (104.8 kg)  02/15/20 230 lb 9.6 oz (104.6 kg)  01/20/20 232 lb (105.2 kg)    Lab Results  Component Value Date   FERRITIN 6 (L) 12/07/2019    CBC Latest Ref Rng & Units 03/07/2020 12/07/2019 09/30/2019  WBC 4.0 - 10.5 K/uL 8.5 12.1(H) 10.1  Hemoglobin 12.0 - 15.0 g/dL 13.1 10.2(L) 10.6(L)  Hematocrit 36.0 - 46.0 % 40.4 33.7(L) 35.0  Platelets 150.0 - 400.0 K/uL 283.0 394.0 392    She is followed in cardiology and vascular surgery for an enlarged thoracic aorta question aneurysmal versus just borderline dilation   CT angio chest aorta with and without contrast in February revealed an ascending aorta  at the upper limit of normal at 3.8 cm  IMPRESSION: 1. Developing plaque-like region of probable pleuroparenchymal scarring at the left lung apex compared to relatively remote prior imaging from September of 2012. However, due to the change in appearance since the prior study and the patient's underlying emphysema and presumed smoking history, a developing malignancy such as a Pancoast tumor is difficult to exclude entirely. Recommend follow-up CT scan of the chest in 6-12 weeks to assess for stability. If there is evidence of interval change, then PET-CT would likely be warranted. 2. Extensive, irregular and ulcerated fibrofatty atherosclerotic plaque throughout the descending thoracic aorta with multifocal areas of  small penetrating atherosclerotic ulcers. No evidence of aneurysm or dissection. Aortic Atherosclerosis (ICD10-170.0) 3. Coronary artery calcifications. 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   Follow-up CT scanning demonstrated stability of the lung lesions most recently on 02/02/2020    IMPRESSION: 1. Stable biapical pleural and parenchymal scarring. The imaging characteristics and lack of any significant change since 04/20/2019 are encouraging and suggest benign changes. 2. No acute intrathoracic process. 3. Aortic Atherosclerosis (ICD10-I70.0) and Emphysema (ICD10-J43.9).  CT calcium score of coronary 3 9 Allergies  Allergen Reactions  . Gabapentin Other (See Comments)    Fluid retention   . Clindamycin Rash  . Sulfa Antibiotics Nausea And Vomiting   Current Meds  Medication Sig  . ACETAMINOPHEN-BUTALBITAL 50-325 MG TABS TAKE 1 TABLET BY MOUTH EVERY DAY AS NEEDED (Patient taking differently: TAKE 1 TABLET by mouth once a week)  . allopurinol (ZYLOPRIM) 300 MG tablet Take 1 tablet (300 mg total) by mouth daily.  Kerry Powell amLODipine (NORVASC) 5 MG tablet Take 1.5 tablets (7.5 mg total) by mouth daily.  Kerry Powell  cetirizine (ZYRTEC ALLERGY) 10 MG tablet Take 1 tablet (10 mg total) by mouth daily.  . cholecalciferol (VITAMIN D3) 25 MCG (1000 UNIT) tablet Take 2,000 Units by mouth daily.  . clopidogrel (PLAVIX) 75 MG tablet TAKE 1 TABLET BY MOUTH EVERY DAY  . Dextromethorphan Polistirex (DELSYM PO) Take by mouth.  . DULoxetine (CYMBALTA) 60 MG capsule TAKE 1 CAPSULE BY MOUTH EVERY DAY  . ferrous sulfate 325 (65 FE) MG tablet Take 325 mg by mouth daily with breakfast.  . fluticasone (FLONASE) 50 MCG/ACT nasal spray Place 2 sprays into both nostrils daily.  . furosemide (LASIX) 20 MG tablet Take 20 mg by mouth. 3 times a week as needed for swelling  . ipratropium-albuterol (DUONEB) 0.5-2.5 (3) MG/3ML SOLN Take 3 mLs by nebulization 4 (four) times daily.  . metFORMIN  (GLUCOPHAGE) 500 MG tablet Take 1 tablet (500 mg total) by mouth 2 (two) times daily with a meal.  . methocarbamol (ROBAXIN) 500 MG tablet Take 1 tablet (500 mg total) by mouth 2 (two) times daily as needed for muscle spasms.  . metoprolol succinate (TOPROL-XL) 50 MG 24 hr tablet Take 50 mg by mouth daily.  . montelukast (SINGULAIR) 10 MG tablet Take 1 tablet (10 mg total) by mouth at bedtime.  . pantoprazole (PROTONIX) 40 MG tablet Take 1 tablet (40 mg total) by mouth 2 (two) times daily before a meal.  . Phenylephrine-DM-GG (MUCINEX FAST-MAX CONGEST COUGH PO) Take by mouth.  . rosuvastatin (CRESTOR) 40 MG tablet Take 1 tablet (40 mg total) by mouth daily.  Kerry Powell telmisartan (MICARDIS) 40 MG tablet Take 40 mg by mouth daily.  Kerry Powell umeclidinium-vilanterol (ANORO ELLIPTA) 62.5-25 MCG/INH AEPB Inhale 1 puff into the lungs daily.   Past Medical History:  Diagnosis Date  . Anxiety   . Arthritis   . BACK PAIN, CHRONIC 04/04/2007   Qualifier: Diagnosis of  By: Carlean Purl MD, Dimas Millin BIPOLAR AFFECTIVE DISORDER 04/04/2007   Qualifier: History of  By: Carlean Purl MD, Dimas Millin   . CAD (coronary artery disease), native coronary artery    mild CAD with no obstructive disease by coronary CTA 06/2019  . Cataract    removed both eyes  . Chronic neck pain 04/30/2015   Cervical spondylosis [facility] Intermittent numbness and tingling in occipital region and headaches S/p 2 occipital nerve blocks - only temp relief no relief with gabapentin, ultram, brace MRI neck - facet arthrosis   . CKD (chronic kidney disease) 04/26/2015  . COPD (chronic obstructive pulmonary disease) ([place])    pt denies - told this while was smoking -  . Cough 04/26/2015  . Depression 07/01/2017  . Diabetes ([employer]) 04/27/2015   Diagnosed 03/2015   . Dizziness 06/10/2017  . Dysphagia 04/26/2015  . DYSPNEA 12/24/2007   Qualifier: Diagnosis of  By: Glennis Brink    . Elevated TSH 08/09/2015  . GASTRIC ULCER, ACUTE 04/07/2007    Annotation: EGD 04/07/07 Qualifier: Diagnosis of  By: Carlean Purl MD, FACG, [place] 04/07/2007   Annotation: EGD 04/07/07 Qualifier: Diagnosis of  By: Carlean Purl MD, Dimas Millin   . GERD (gastroesophageal reflux disease)   . H/O renal cell carcinoma 05/23/2017   Dx 2010, s/p cryoablation Follows with urology - Dr Lovena Neighbours  . HIATAL HERNIA 04/07/2007   Annotation: EGD 04/07/07 Qualifier: Diagnosis of  By: Carlean Purl MD, Dimas Millin History of CVA (cerebrovascular accident) 04/26/2015  . History of gout 04/26/2015  .  Hyperlipidemia   . Hypertension   . Irritable bowel syndrome 04/04/2007   Qualifier: Diagnosis of  By: Carlean Purl MD, Dimas Millin Kidney tumor (benign)   . Nausea and vomiting 09/09/2017  . 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 OSTEOARTHRITIS 04/04/2007   Qualifier: Diagnosis of  By: Carlean Purl MD, Dimas Millin Personal history of colonic adenoma 06/07/2012  . POSTNASAL DRIP SYNDROME 12/29/2007   Qualifier: Diagnosis of  By: Chase Caller MD, [name]    . RESTLESS LEG SYNDROME 04/04/2007   Qualifier: Diagnosis of  By: Carlean Purl MD, FACG, [place], 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   Qualifier: Diagnosis of  By: Carlean Purl MD, Dimas Millin Sleep difficulties 09/09/2017  . Snoring 04/26/2015  . Stroke [facility])    x2 2011  . Thoracic aortic aneurysm ([place]) 02/06/2018   Seen on MRI 01/25/18 of thoracic spine ordered by [facility] -- referred to CTS to monitor  -- 3.5 cm  . TIA (transient ischemic attack)   . TOBACCO ABUSE 12/17/2007   Qualifier: Diagnosis of  By: Chase Caller MD, [name]    . Trigeminal neuralgia of right side of face 02/08/2016  . URI (upper respiratory infection) 02/08/2016  . Vitamin D deficiency 03/12/2018   Past Surgical History:  Procedure Laterality Date  . ABDOMINAL HYSTERECTOMY    . CATARACT EXTRACTION    . COLONOSCOPY    . CYSTOCELE REPAIR    . ESOPHAGOGASTRODUODENOSCOPY    . KIDNEY SURGERY     benign  tumor removal  . ORTHOPEDIC SURGERY     Wrist & elbow  . POLYPECTOMY    . RECTOCELE REPAIR    . UPPER GASTROINTESTINAL ENDOSCOPY     Social History   Social History Narrative   Married, retired   1 son 1 daughter   2 caffeine/day   family history includes Cancer in her mother; Congestive Heart Failure in her mother; Diabetes in her father and sister; Heart disease in her father; Kidney disease in her father and sister.   Review of Systems See HPI.  Remainder the review of systems appears negative at this time  Objective:   Physical Exam BP 114/64 (BP Location: Right Arm, Patient Position: Sitting, Cuff Size: Large)   Pulse 67   Ht 5\' 5"  (1.651 m)   Wt 231 lb (104.8 kg)   BMI 38.44 kg/m  Very obese pleasant white woman in no acute distress The eyes are anicteric The lungs are clear Heart sounds are normal The abdomen is very obese soft nontender without organomegaly or mass.  Bowel sounds present.  Harrel Lemon, CMA present  Rectal exam shows some small fleshy tags, there is decreased resting and voluntary sphincter tone/squeeze almost absent.  Perhaps a very tiny rectocele.  There is a ring like structure present proximal to the dentate line it seems, presumably related to prior surgery.  There is no mass.  Simulated defecation sows reduced descent appropriate abdominal contraction I think.   Appropriate mood and affect Alert and oriented x3  Data reviewed includes pulmonary notes primary care notes from 2021 labs in the computer imaging as above

## 2020-03-07 NOTE — Telephone Encounter (Signed)
[facility] Pre-operative Risk Assessment     Request for surgical clearance:     Endoscopy Procedure  What type of surgery is being performed?     EGD & Colonoscopy  When is this surgery scheduled?     04/01/2020  What type of clearance is required ?   Pharmacy  Are there any medications that need to be held prior to surgery and how long? Plavix, 5 days  Practice name and name of physician performing surgery?      [facility]  What is your office phone and fax number?      Phone- [phone]  [phone]  Anesthesia type (None, local, MAC, general) ?       MAC

## 2020-03-08 ENCOUNTER — Other Ambulatory Visit: Payer: Self-pay | Admitting: Internal Medicine

## 2020-03-08 NOTE — Telephone Encounter (Signed)
Ok to hold plavix x 5 days for procedure.  Start back per GI protocol.   Binnie Rail, MD

## 2020-03-08 NOTE — Telephone Encounter (Signed)
Alleene informed and verbalized understanding

## 2020-03-08 NOTE — Telephone Encounter (Signed)
Will defer decision to the patient's PCP. Patient is not on plavix for cardiac reason. Her recent coronary CT in May 2021 only showed mild CAD. She is a low risk patient from cardiac perspective. I suspect the patient is either on plavix due to PAD (followed by Dr. Trula Slade) or h/o CVA. Since plavix is being prescribed by Dr. Billey Gosling, will defer to Dr. Quay Burow to decide holding time for plavix. I will remove from cardiac clearance pool. Thank you.

## 2020-03-26 ENCOUNTER — Other Ambulatory Visit: Payer: Self-pay | Admitting: Internal Medicine

## 2020-04-01 ENCOUNTER — Ambulatory Visit (AMBULATORY_SURGERY_CENTER): Payer: Medicare Other | Admitting: Internal Medicine

## 2020-04-01 ENCOUNTER — Other Ambulatory Visit: Payer: Self-pay

## 2020-04-01 ENCOUNTER — Encounter: Payer: Self-pay | Admitting: Internal Medicine

## 2020-04-01 VITALS — BP 88/42 | HR 82 | Temp 97.3°F | Resp 12 | Ht 65.0 in | Wt 231.0 lb

## 2020-04-01 DIAGNOSIS — K5730 Diverticulosis of large intestine without perforation or abscess without bleeding: Secondary | ICD-10-CM | POA: Diagnosis not present

## 2020-04-01 DIAGNOSIS — D128 Benign neoplasm of rectum: Secondary | ICD-10-CM

## 2020-04-01 DIAGNOSIS — D125 Benign neoplasm of sigmoid colon: Secondary | ICD-10-CM

## 2020-04-01 DIAGNOSIS — R1310 Dysphagia, unspecified: Secondary | ICD-10-CM | POA: Diagnosis not present

## 2020-04-01 DIAGNOSIS — D123 Benign neoplasm of transverse colon: Secondary | ICD-10-CM

## 2020-04-01 DIAGNOSIS — K449 Diaphragmatic hernia without obstruction or gangrene: Secondary | ICD-10-CM | POA: Diagnosis not present

## 2020-04-01 DIAGNOSIS — D127 Benign neoplasm of rectosigmoid junction: Secondary | ICD-10-CM | POA: Diagnosis not present

## 2020-04-01 DIAGNOSIS — D509 Iron deficiency anemia, unspecified: Secondary | ICD-10-CM | POA: Diagnosis not present

## 2020-04-01 MED ORDER — SODIUM CHLORIDE 0.9 % IV SOLN
500.0000 mL | Freq: Once | INTRAVENOUS | Status: DC
Start: 2020-04-01 — End: 2020-04-01

## 2020-04-01 NOTE — Progress Notes (Signed)
Called to room to assist during endoscopic procedure.  Patient ID and intended procedure confirmed with present staff. Received instructions for my participation in the procedure from the performing physician.  

## 2020-04-01 NOTE — Op Note (Signed)
[place] Patient Name: Kerry Powell Procedure Date: 04/01/2020 3:09 PM MRN: [id number] Endoscopist: Gatha Mayer , MD Age: 75 Referring MD:  Date of Birth: 06/17/1945 Gender: Female Account #: [id number] Procedure:                Colonoscopy Indications:              Iron deficiency anemia Medicines:                Propofol per Anesthesia, Monitored Anesthesia Care Procedure:                Pre-Anesthesia Assessment:                           - Prior to the procedure, a History and Physical                            was performed, and patient medications and                            allergies were reviewed. The patient's tolerance of                            previous anesthesia was also reviewed. The risks                            and benefits of the procedure and the sedation                            options and risks were discussed with the patient.                            All questions were answered, and informed consent                            was obtained. Prior Anticoagulants: The patient                            last took Plavix (clopidogrel) 5 days prior to the                            procedure. ASA Grade Assessment: III - A patient                            with severe systemic disease. After reviewing the                            risks and benefits, the patient was deemed in                            satisfactory condition to undergo the procedure.                           After obtaining informed consent, the colonoscope  was passed under direct vision. Throughout the                            procedure, the patient's blood pressure, pulse, and                            oxygen saturations were monitored continuously. The                            Olympus PFC-[id number]) Colonoscope was                            introduced through the anus and advanced to the the                            cecum,  identified by appendiceal orifice and                            ileocecal valve. The colonoscopy was performed                            without difficulty. The patient tolerated the                            procedure well. The quality of the bowel                            preparation was good. The ileocecal valve,                            appendiceal orifice, and rectum were photographed.                            The bowel preparation used was Miralax via split                            dose instruction. Scope In: 3:36:07 PM Scope Out: 3:55:54 PM Scope Withdrawal Time: 0 hours 10 minutes 54 seconds  Total Procedure Duration: 0 hours 19 minutes 47 seconds  Findings:                 The digital rectal exam findings include decreased                            sphincter tone. Pertinent negatives include no anal                            lesion or abnormality.                           Two sessile polyps were found in the rectum and                            sigmoid colon. The polyps were diminutive in size.  These polyps were removed with a cold snare.                            Resection and retrieval were complete. Verification                            of patient identification for the specimen was                            done. Estimated blood loss was minimal.                           A 1 mm polyp was found in the transverse colon. The                            polyp was sessile. The polyp was removed with a                            cold biopsy forceps. Resection and retrieval were                            complete. Verification of patient identification                            for the specimen was done. Estimated blood loss was                            minimal.                           Multiple diverticula were found in the sigmoid                            colon.                           The exam was otherwise without  abnormality on                            direct and retroflexion views. Complications:            No immediate complications. Estimated Blood Loss:     Estimated blood loss was minimal. Impression:               - Decreased sphincter tone found on digital rectal                            exam.                           - Two diminutive polyps in the rectum and in the                            sigmoid colon, removed with a cold snare. Resected  and retrieved.                           - One 1 mm polyp in the transverse colon, removed                            with a cold biopsy forceps. Resected and retrieved.                           - Diverticulosis in the sigmoid colon.                           - The examination was otherwise normal on direct                            and retroflexion views. No cause of anemia seen. Recommendation:           - Patient has a contact number available for                            emergencies. The signs and symptoms of potential                            delayed complications were discussed with the                            patient. Return to normal activities tomorrow.                            Written discharge instructions were provided to the                            patient.                           - Resume previous diet.                           - Continue present medications.                           - Resume Plavix (clopidogrel) at prior dose                            tomorrow.                           - Await pathology results.                           - See the other procedure note for documentation of                            additional recommendations.                           - Will consider MR  defecography pending pathology                            review - she thinks she is having rectal prolpase -                            no clear signs of that here Gatha Mayer, MD 04/01/2020 4:15:34  PM This report has been signed electronically.

## 2020-04-01 NOTE — Progress Notes (Signed)
A/ox3, pleased with MAC, report to RN 

## 2020-04-01 NOTE — Patient Instructions (Addendum)
I found and removed 3 tiny polyps from the colon.  I did not see any cause for swallowing problems.   I will contact you after the pathology results are in. We are also going to set up a test called a modified barium swallow to evaluate swallowing problems further.  Restart clopidogrel tomorrow.  I appreciate the opportunity to care for you. Gatha Mayer, MD, FACG  YOU HAD AN ENDOSCOPIC PROCEDURE TODAY AT [facility]:   Refer to the procedure report that was given to you for any specific questions about what was found during the examination.  If the procedure report does not answer your questions, please call your gastroenterologist to clarify.  If you requested that your care partner not be given the details of your procedure findings, then the procedure report has been included in a sealed envelope for you to review at your convenience later.  YOU SHOULD EXPECT: Some feelings of bloating in the abdomen. Passage of more gas than usual.  Walking can help get rid of the air that was put into your GI tract during the procedure and reduce the bloating. If you had a lower endoscopy (such as a colonoscopy or flexible sigmoidoscopy) you may notice spotting of blood in your stool or on the toilet paper. If you underwent a bowel prep for your procedure, you may not have a normal bowel movement for a few days.  Please Note:  You might notice some irritation and congestion in your nose or some drainage.  This is from the oxygen used during your procedure.  There is no need for concern and it should clear up in a day or so.  SYMPTOMS TO REPORT IMMEDIATELY:   Following lower endoscopy (colonoscopy or flexible sigmoidoscopy):  Excessive amounts of blood in the stool  Significant tenderness or worsening of abdominal pains  Swelling of the abdomen that is new, acute  Fever of 100F or higher   Following upper endoscopy (EGD)  Vomiting of blood or coffee ground material  New chest  pain or pain under the shoulder blades  Painful or persistently difficult swallowing  New shortness of breath  Fever of 100F or higher  Black, tarry-looking stools  For urgent or emergent issues, a gastroenterologist can be reached at any hour by calling [phone]. Do not use MyChart messaging for urgent concerns.    DIET:  We do recommend a small meal at first, but then you may proceed to your regular diet.  Drink plenty of fluids but you should avoid alcoholic beverages for 24 hours.  ACTIVITY:  You should plan to take it easy for the rest of today and you should NOT DRIVE or use heavy machinery until tomorrow (because of the sedation medicines used during the test).    FOLLOW UP: Our staff will call the number listed on your records 48-72 hours following your procedure to check on you and address any questions or concerns that you may have regarding the information given to you following your procedure. If we do not reach you, we will leave a message.  We will attempt to reach you two times.  During this call, we will ask if you have developed any symptoms of COVID 19. If you develop any symptoms (ie: fever, flu-like symptoms, shortness of breath, cough etc.) before then, please call [phone].  If you test positive for Covid 19 in the 2 weeks post procedure, please call and report this information to [place].    If  any biopsies were taken you will be contacted by phone or by letter within the next 1-3 weeks.  Please call us at [phone] if you have not heard about the biopsies in 3 weeks.    SIGNATURES/CONFIDENTIALITY: You and/or your care partner have signed paperwork which will be entered into your electronic medical record.  These signatures attest to the fact that that the information above on your After Visit Summary has been reviewed and is understood.  Full responsibility of the confidentiality of this discharge information lies with you and/or your care-partner.

## 2020-04-01 NOTE — Progress Notes (Signed)
VS taken by S.B. 

## 2020-04-01 NOTE — Op Note (Signed)
[facility] Patient Name: Kerry Powell Procedure Date: 04/01/2020 3:09 PM MRN: [id number] Endoscopist: Gatha Mayer , MD Age: 75 Referring MD:  Date of Birth: 1945/07/01 Gender: Female Account #: [id number] Procedure:                Upper GI endoscopy Indications:              Iron deficiency anemia, Dysphagia Medicines:                Propofol per Anesthesia, Monitored Anesthesia Care Procedure:                Pre-Anesthesia Assessment:                           - Prior to the procedure, a History and Physical                            was performed, and patient medications and                            allergies were reviewed. The patient's tolerance of                            previous anesthesia was also reviewed. The risks                            and benefits of the procedure and the sedation                            options and risks were discussed with the patient.                            All questions were answered, and informed consent                            was obtained. Prior Anticoagulants: The patient                            last took Plavix (clopidogrel) 5 days prior to the                            procedure. ASA Grade Assessment: III - A patient                            with severe systemic disease. After reviewing the                            risks and benefits, the patient was deemed in                            satisfactory condition to undergo the procedure.                           After obtaining informed consent, the endoscope was  passed under direct vision. Throughout the                            procedure, the patient's blood pressure, pulse, and                            oxygen saturations were monitored continuously. The                            Endoscope was introduced through the mouth, and                            advanced to the second part of duodenum. The upper                             GI endoscopy was accomplished without difficulty.                            The patient tolerated the procedure fairly well. Scope In: Scope Out: Findings:                 A 4 cm hiatal hernia was present.                           The gastroesophageal flap valve was visualized                            endoscopically and classified as Hill Grade IV (no                            fold, wide open lumen, hiatal hernia present).                           The exam was otherwise without abnormality.                           The cardia and gastric fundus were normal on                            retroflexion. Complications:            No immediate complications. Estimated Blood Loss:     Estimated blood loss: none. Impression:               - 4 cm hiatal hernia.                           - Gastroesophageal flap valve classified as Hill                            Grade IV (no fold, wide open lumen, hiatal hernia                            present).                           -  The examination was otherwise normal.                           - No specimens collected. No cause of dysphagia or                            anemia seen. Recommendation:           - Patient has a contact number available for                            emergencies. The signs and symptoms of potential                            delayed complications were discussed with the                            patient. Return to normal activities tomorrow.                            Written discharge instructions were provided to the                            patient.                           - Resume previous diet.                           - Continue present medications.                           - See the other procedure note for documentation of                            additional recommendations.                           - OFFICE [facility] TO                            EVALUATE  DYSPHAGIA Gatha Mayer, MD 04/01/2020 4:10:23 PM This report has been signed electronically.

## 2020-04-04 ENCOUNTER — Ambulatory Visit: Payer: Medicare Other

## 2020-04-05 ENCOUNTER — Telehealth: Payer: Self-pay | Admitting: *Deleted

## 2020-04-05 ENCOUNTER — Telehealth: Payer: Self-pay

## 2020-04-05 ENCOUNTER — Other Ambulatory Visit (HOSPITAL_COMMUNITY): Payer: Self-pay

## 2020-04-05 DIAGNOSIS — R1310 Dysphagia, unspecified: Secondary | ICD-10-CM

## 2020-04-05 NOTE — Telephone Encounter (Signed)
Follow up call made. 

## 2020-04-05 NOTE — Telephone Encounter (Signed)
Patient has been scheduled for a modified barium swallow per the procedure report on 04/01/20 for 04/12/20 11:00 at [facility] radiology.  Patient notified of the appointment date and time.

## 2020-04-05 NOTE — Telephone Encounter (Signed)
Attempted 2nd f/u phone call. No answer. Left message.  °

## 2020-04-06 ENCOUNTER — Ambulatory Visit: Payer: Medicare Other | Admitting: Internal Medicine

## 2020-04-12 ENCOUNTER — Other Ambulatory Visit: Payer: Self-pay

## 2020-04-12 ENCOUNTER — Ambulatory Visit (HOSPITAL_COMMUNITY)
Admission: RE | Admit: 2020-04-12 | Discharge: 2020-04-12 | Disposition: A | Discharge status: Home / Self Care | Payer: Medicare Other | Source: Ambulatory Visit | Attending: Internal Medicine | Admitting: Internal Medicine

## 2020-04-12 DIAGNOSIS — R1310 Dysphagia, unspecified: Secondary | ICD-10-CM

## 2020-04-12 NOTE — Progress Notes (Signed)
Modified Barium Swallow Progress Note  Patient Details  Name: Kerry Powell MRN: [id number] Date of Birth: 1945/11/29  Today's Date: 04/12/2020  Modified Barium Swallow completed.  Full report located under Chart Review in the Imaging Section.  Brief recommendations include the following:  Clinical Impression  Pt demonstrates an overall timely and functional oropharyngeal swallow with good airway protection noted with all consistencies. Trace pentetration was demonstrated x1 with thin liquids (PAS 2) in which coughing occured afterwards; however, coughing was also noted throughout PO trials that was unrelated to pentration or aspiration. Pt demonstrated swallow initation with thin liquids at the pyriform sinuses. Oral residue and poor bolus cohesion were noted across all consistencies, but oral phase still appears to be functional - question impact of ill-fitting dentures. Residue within the vallecula occurred when swallowing a pill which pt cleared with second swallow of thin liquids. Recomend regular diet and thin liquids with pt to choose consistencies that are best suited for her given difficulty with dentures. Pt may benefit from continued f/u with GI doctor if difficullties continue to present.   Swallow Evaluation Recommendations   Recommended Consults: Consider GI evaluation   SLP Diet Recommendations: Regular solids;Thin liquid   Liquid Administration via: Cup;Straw   Medication Administration: Whole meds with liquid   Supervision: Patient able to self feed       Postural Changes: Remain semi-upright after after feeds/meals (Comment);Seated upright at 90 degrees   Oral Care Recommendations: Oral care BID       Jeanine Luz., SLP Student 04/12/2020,1:07 PM

## 2020-04-13 ENCOUNTER — Other Ambulatory Visit: Payer: Self-pay

## 2020-04-13 ENCOUNTER — Ambulatory Visit (INDEPENDENT_AMBULATORY_CARE_PROVIDER_SITE_OTHER): Payer: Medicare Other

## 2020-04-13 VITALS — BP 120/78 | HR 71 | Temp 98.0°F | Ht 65.0 in | Wt 234.2 lb

## 2020-04-13 DIAGNOSIS — I712 Thoracic aortic aneurysm, without rupture, unspecified: Secondary | ICD-10-CM

## 2020-04-13 DIAGNOSIS — Z0000 Encounter for general adult medical examination without abnormal findings: Secondary | ICD-10-CM

## 2020-04-13 NOTE — Patient Instructions (Signed)
Kerry Powell , Thank you for taking time to come for your Medicare Wellness Visit. I appreciate your ongoing commitment to your health goals. Please review the following plan we discussed and let me know if I can assist you in the future.   Screening recommendations/referrals: Colonoscopy: 04/01/2020; no repeat due to age 75: no record in chart Bone Density: no record in chart Recommended yearly ophthalmology/optometry visit for glaucoma screening and checkup Recommended yearly dental visit for hygiene and checkup  Vaccinations: Influenza vaccine: declined Pneumococcal vaccine: declined Tdap vaccine: declined Shingles vaccine: declined   Covid-19: up to date  Advanced directives: Documents on file.  Conditions/risks identified: Yes; Reviewed health maintenance screenings with patient today and relevant education, vaccines, and/or referrals were provided. Please continue to do your personal lifestyle choices by: daily care of teeth and gums, regular physical activity (goal should be 5 days a week for 30 minutes), eat a healthy diet, avoid tobacco and drug use, limiting any alcohol intake, taking a low-dose aspirin (if not allergic or have been advised by your provider otherwise) and taking vitamins and minerals as recommended by your provider. Continue doing brain stimulating activities (puzzles, reading, adult coloring books, staying active) to keep memory sharp. Continue to eat heart healthy diet (full of fruits, vegetables, whole grains, lean protein, water--limit salt, fat, and sugar intake) and increase physical activity as tolerated.  Next appointment: Please schedule your next Medicare Wellness Visit with your Nurse Health Advisor in 1 year by calling [phone].  Preventive Care 41 Years and Older, Female Preventive care refers to lifestyle choices and visits with your health care provider that can promote health and wellness. What does preventive care include?  A yearly  physical exam. This is also called an annual well check.  Dental exams once or twice a year.  Routine eye exams. Ask your health care provider how often you should have your eyes checked.  Personal lifestyle choices, including:  Daily care of your teeth and gums.  Regular physical activity.  Eating a healthy diet.  Avoiding tobacco and drug use.  Limiting alcohol use.  Practicing safe sex.  Taking low-dose aspirin every day.  Taking vitamin and mineral supplements as recommended by your health care provider. What happens during an annual well check? The services and screenings done by your health care provider during your annual well check will depend on your age, overall health, lifestyle risk factors, and family history of disease. Counseling  Your health care provider may ask you questions about your:  Alcohol use.  Tobacco use.  Drug use.  Emotional well-being.  Home and relationship well-being.  Sexual activity.  Eating habits.  History of falls.  Memory and ability to understand (cognition).  Work and work Statistician.  Reproductive health. Screening  You may have the following tests or measurements:  Height, weight, and BMI.  Blood pressure.  Lipid and cholesterol levels. These may be checked every 5 years, or more frequently if you are over 56 years old.  Skin check.  Lung cancer screening. You may have this screening every year starting at age 56 if you have a 30-pack-year history of smoking and currently smoke or have quit within the past 15 years.  Fecal occult blood test (FOBT) of the stool. You may have this test every year starting at age 62.  Flexible sigmoidoscopy or colonoscopy. You may have a sigmoidoscopy every 5 years or a colonoscopy every 10 years starting at age 20.  Hepatitis C blood test.  Hepatitis  B blood test.  Sexually transmitted disease (STD) testing.  Diabetes screening. This is done by checking your blood sugar  (glucose) after you have not eaten for a while (fasting). You may have this done every 1-3 years.  Bone density scan. This is done to screen for osteoporosis. You may have this done starting at age 57.  Mammogram. This may be done every 1-2 years. Talk to your health care provider about how often you should have regular mammograms. Talk with your health care provider about your test results, treatment options, and if necessary, the need for more tests. Vaccines  Your health care provider may recommend certain vaccines, such as:  Influenza vaccine. This is recommended every year.  Tetanus, diphtheria, and acellular pertussis (Tdap, Td) vaccine. You may need a Td booster every 10 years.  Zoster vaccine. You may need this after age 109.  Pneumococcal 13-valent conjugate (PCV13) vaccine. One dose is recommended after age 28.  Pneumococcal polysaccharide (PPSV23) vaccine. One dose is recommended after age 71. Talk to your health care provider about which screenings and vaccines you need and how often you need them. This information is not intended to replace advice given to you by your health care provider. Make sure you discuss any questions you have with your health care provider. Document Released: 03/11/2015 Document Revised: 11/02/2015 Document Reviewed: 12/14/2014 Elsevier Interactive Patient Education  2017 [place] Prevention in the Home Falls can cause injuries. They can happen to people of all ages. There are many things you can do to make your home safe and to help prevent falls. What can I do on the outside of my home?  Regularly fix the edges of walkways and driveways and fix any cracks.  Remove anything that might make you trip as you walk through a door, such as a raised step or threshold.  Trim any bushes or trees on the path to your home.  Use bright outdoor lighting.  Clear any walking paths of anything that might make someone trip, such as rocks or  tools.  Regularly check to see if handrails are loose or broken. Make sure that both sides of any steps have handrails.  Any raised decks and porches should have guardrails on the edges.  Have any leaves, snow, or ice cleared regularly.  Use sand or salt on walking paths during winter.  Clean up any spills in your garage right away. This includes oil or grease spills. What can I do in the bathroom?  Use night lights.  Install grab bars by the toilet and in the tub and shower. Do not use towel bars as grab bars.  Use non-skid mats or decals in the tub or shower.  If you need to sit down in the shower, use a plastic, non-slip stool.  Keep the floor dry. Clean up any water that spills on the floor as soon as it happens.  Remove soap buildup in the tub or shower regularly.  Attach bath mats securely with double-sided non-slip rug tape.  Do not have throw rugs and other things on the floor that can make you trip. What can I do in the bedroom?  Use night lights.  Make sure that you have a light by your bed that is easy to reach.  Do not use any sheets or blankets that are too big for your bed. They should not hang down onto the floor.  Have a firm chair that has side arms. You can use this for  support while you get dressed.  Do not have throw rugs and other things on the floor that can make you trip. What can I do in the kitchen?  Clean up any spills right away.  Avoid walking on wet floors.  Keep items that you use a lot in easy-to-reach places.  If you need to reach something above you, use a strong step stool that has a grab bar.  Keep electrical cords out of the way.  Do not use floor polish or wax that makes floors slippery. If you must use wax, use non-skid floor wax.  Do not have throw rugs and other things on the floor that can make you trip. What can I do with my stairs?  Do not leave any items on the stairs.  Make sure that there are handrails on both  sides of the stairs and use them. Fix handrails that are broken or loose. Make sure that handrails are as long as the stairways.  Check any carpeting to make sure that it is firmly attached to the stairs. Fix any carpet that is loose or worn.  Avoid having throw rugs at the top or bottom of the stairs. If you do have throw rugs, attach them to the floor with carpet tape.  Make sure that you have a light switch at the top of the stairs and the bottom of the stairs. If you do not have them, ask someone to add them for you. What else can I do to help prevent falls?  Wear shoes that:  Do not have high heels.  Have rubber bottoms.  Are comfortable and fit you well.  Are closed at the toe. Do not wear sandals.  If you use a stepladder:  Make sure that it is fully opened. Do not climb a closed stepladder.  Make sure that both sides of the stepladder are locked into place.  Ask someone to hold it for you, if possible.  Clearly mark and make sure that you can see:  Any grab bars or handrails.  First and last steps.  Where the edge of each step is.  Use tools that help you move around (mobility aids) if they are needed. These include:  Canes.  Walkers.  Scooters.  Crutches.  Turn on the lights when you go into a dark area. Replace any light bulbs as soon as they burn out.  Set up your furniture so you have a clear path. Avoid moving your furniture around.  If any of your floors are uneven, fix them.  If there are any pets around you, be aware of where they are.  Review your medicines with your doctor. Some medicines can make you feel dizzy. This can increase your chance of falling. Ask your doctor what other things that you can do to help prevent falls. This information is not intended to replace advice given to you by your health care provider. Make sure you discuss any questions you have with your health care provider. Document Released: 12/09/2008 Document Revised:  07/21/2015 Document Reviewed: 03/19/2014 Elsevier Interactive Patient Education  2017 [employer].

## 2020-04-13 NOTE — Progress Notes (Signed)
Subjective:   Kerry Powell is a 75 y.o. female who presents for Medicare Annual (Subsequent) preventive examination.  Review of Systems    No ROS. Medicare Wellness Visit. Additional risk factors are reflected in social history. Cardiac Risk Factors include: advanced age (>32men, >78 women);diabetes mellitus;dyslipidemia;family history of premature cardiovascular disease;hypertension;obesity (BMI >30kg/m2)     Objective:    Today's Vitals   04/13/20 1406  BP: 120/78  Pulse: 71  Temp: 98 F (36.7 C)  SpO2: 94%  Weight: 234 lb 3.2 oz (106.2 kg)  Height: 5\' 5"  (1.651 m)  PainSc: 10-Worst pain ever   Body mass index is 38.97 kg/m.  Advanced Directives 04/13/2020 04/27/2019 05/19/2018 11/19/2016 11/09/2015 05/13/2014  Does Patient Have a Medical Advance Directive? Yes Yes Yes No Yes Yes  Type of Advance Directive - Living will [place];Living will - [place];Living will Living will  Does patient want to make changes to medical advance directive? No - Patient declined No - Patient declined - - - -  Would patient like information on creating a medical advance directive? - - - Yes (ED - Information included in AVS) - -    Current Medications (verified) Outpatient Encounter Medications as of 04/13/2020  Medication Sig  . ACETAMINOPHEN-BUTALBITAL 50-325 MG TABS TAKE 1 TABLET BY MOUTH EVERY DAY AS NEEDED (Patient taking differently: TAKE 1 TABLET by mouth once a week)  . allopurinol (ZYLOPRIM) 300 MG tablet Take 1 tablet (300 mg total) by mouth daily.  [name] amLODipine (NORVASC) 5 MG tablet Take 1.5 tablets (7.5 mg total) by mouth daily.  . cetirizine (ZYRTEC ALLERGY) 10 MG tablet Take 1 tablet (10 mg total) by mouth daily.  . cholecalciferol (VITAMIN D3) 25 MCG (1000 UNIT) tablet Take 2,000 Units by mouth daily.  . clopidogrel (PLAVIX) 75 MG tablet TAKE 1 TABLET BY MOUTH EVERY DAY  . Dextromethorphan Polistirex (DELSYM PO) Take by mouth. (Patient not  taking: Reported on 04/01/2020)  . DULoxetine (CYMBALTA) 60 MG capsule TAKE 1 CAPSULE BY MOUTH EVERY DAY  . ferrous sulfate 325 (65 FE) MG tablet Take 325 mg by mouth daily with breakfast.  . fluticasone (FLONASE) 50 MCG/ACT nasal spray Place 2 sprays into both nostrils daily.  . furosemide (LASIX) 20 MG tablet Take 20 mg by mouth. 3 times a week as needed for swelling  . ipratropium-albuterol (DUONEB) 0.5-2.5 (3) MG/3ML SOLN Take 3 mLs by nebulization 4 (four) times daily.  . metFORMIN (GLUCOPHAGE) 500 MG tablet Take 1 tablet (500 mg total) by mouth 2 (two) times daily with a meal.  . methocarbamol (ROBAXIN) 500 MG tablet Take 1 tablet (500 mg total) by mouth 2 (two) times daily as needed for muscle spasms.  . metoprolol succinate (TOPROL-XL) 50 MG 24 hr tablet Take 50 mg by mouth daily.  . montelukast (SINGULAIR) 10 MG tablet Take 1 tablet (10 mg total) by mouth at bedtime.  . pantoprazole (PROTONIX) 40 MG tablet Take 1 tablet (40 mg total) by mouth 2 (two) times daily before a meal.  . Phenylephrine-DM-GG (MUCINEX FAST-MAX CONGEST COUGH PO) Take by mouth. (Patient not taking: Reported on 04/01/2020)  . rosuvastatin (CRESTOR) 40 MG tablet Take 1 tablet (40 mg total) by mouth daily.  [name] telmisartan (MICARDIS) 40 MG tablet Take 40 mg by mouth daily.  [name] umeclidinium-vilanterol (ANORO ELLIPTA) 62.5-25 MCG/INH AEPB Inhale 1 puff into the lungs daily.   No facility-administered encounter medications on file as of 04/13/2020.    Allergies (  verified) Gabapentin, Clindamycin, and Sulfa antibiotics   History: Past Medical History:  Diagnosis Date  . Allergy   . Anemia   . Anxiety   . Arthritis   . BACK PAIN, CHRONIC 04/04/2007   Qualifier: Diagnosis of  By: Carlean Purl MD, Dimas Millin BIPOLAR AFFECTIVE DISORDER 04/04/2007   Qualifier: History of  By: Carlean Purl MD, Dimas Millin   . CAD (coronary artery disease), native coronary artery    mild CAD with no obstructive disease by coronary CTA 06/2019  .  Cataract    removed both eyes  . Chronic neck pain 04/30/2015   Cervical spondylosis [facility] Intermittent numbness and tingling in occipital region and headaches S/p 2 occipital nerve blocks - only temp relief no relief with gabapentin, ultram, brace MRI neck - facet arthrosis   . CKD (chronic kidney disease) 04/26/2015  . COPD (chronic obstructive pulmonary disease) ([place])    pt denies - told this while was smoking -  . Cough 04/26/2015  . Depression 07/01/2017  . Diabetes ([place]) 04/27/2015   Diagnosed 03/2015   . Dizziness 06/10/2017  . Dysphagia 04/26/2015  . DYSPNEA 12/24/2007   Qualifier: Diagnosis of  By: Glennis Brink    . Elevated TSH 08/09/2015  . GASTRIC ULCER, ACUTE 04/07/2007   Annotation: EGD 04/07/07 Qualifier: Diagnosis of  By: Carlean Purl MD, FACG, [place] 04/07/2007   Annotation: EGD 04/07/07 Qualifier: Diagnosis of  By: Carlean Purl MD, Dimas Millin   . GERD (gastroesophageal reflux disease)   . H/O renal cell carcinoma 05/23/2017   Dx 2010, s/p cryoablation Follows with urology - Dr Lovena Neighbours  . HIATAL HERNIA 04/07/2007   Annotation: EGD 04/07/07 Qualifier: Diagnosis of  By: Carlean Purl MD, Dimas Millin History of CVA (cerebrovascular accident) 04/26/2015  . History of gout 04/26/2015  . Hyperlipidemia   . Hypertension   . Irritable bowel syndrome 04/04/2007   Qualifier: Diagnosis of  By: Carlean Purl MD, Dimas Millin Kidney tumor (benign)   . Nausea and vomiting 09/09/2017  . 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 OSTEOARTHRITIS 04/04/2007   Qualifier: Diagnosis of  By: Carlean Purl MD, Dimas Millin Personal history of colonic adenoma 06/07/2012  . POSTNASAL DRIP SYNDROME 12/29/2007   Qualifier: Diagnosis of  By: Chase Caller MD, [name]    . RESTLESS LEG SYNDROME 04/04/2007   Qualifier: Diagnosis of  By: Carlean Purl MD, FACG, [place], 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   Qualifier: Diagnosis of  By: Carlean Purl MD, Dimas Millin Sleep difficulties 09/09/2017  . Snoring 04/26/2015  . Stroke [facility])    x2 2011  . Thoracic aortic aneurysm ([place]) 02/06/2018   Seen on MRI 01/25/18 of thoracic spine ordered by [facility] -- referred to CTS to monitor  -- 3.5 cm  . TIA (transient ischemic attack)   . TOBACCO ABUSE 12/17/2007   Qualifier: Diagnosis of  By: Chase Caller MD, [name]    . Trigeminal neuralgia of right side of face 02/08/2016  . URI (upper respiratory infection) 02/08/2016  . Vitamin D deficiency 03/12/2018   Past Surgical History:  Procedure Laterality Date  . ABDOMINAL HYSTERECTOMY    . APPENDECTOMY    . CATARACT EXTRACTION    . COLONOSCOPY    . CYSTOCELE REPAIR    . ESOPHAGOGASTRODUODENOSCOPY    . KIDNEY SURGERY  benign tumor removal  . ORTHOPEDIC SURGERY     Wrist & elbow  . POLYPECTOMY    . RECTOCELE REPAIR    . TONSILECTOMY, ADENOIDECTOMY, BILATERAL MYRINGOTOMY AND TUBES Bilateral 1968  . UPPER GASTROINTESTINAL ENDOSCOPY     Family History  Problem Relation Age of Onset  . Congestive Heart Failure Mother   . Cancer Mother        ovarian  . Heart disease Father   . Diabetes Father   . Kidney disease Father   . Diabetes Sister   . Kidney disease Sister   . Colon cancer Neg Hx   . Colon polyps Neg Hx   . Esophageal cancer Neg Hx   . Rectal cancer Neg Hx   . Stomach cancer Neg Hx    Social History   Socioeconomic History  . Marital status: Married    Spouse name: Not on file  . Number of children: 2  . Years of education: Not on file  . Highest education level: Not on file  Occupational History  . Occupation: Retired  Tobacco Use  . Smoking status: Former Smoker    Packs/day: 1.00    Years: 46.00    Pack years: 46.00    Quit date: 06/30/2009    Years since quitting: 10.7  . Smokeless tobacco: Never Used  Vaping Use  . Vaping Use: Never used  Substance and Sexual Activity  . Alcohol use: No  . Drug use: No  . Sexual activity: Not on file  Other Topics Concern  .  Not on file  Social History Narrative   Married, retired   1 son 1 daughter   2 caffeine/day   Social Determinants of Health   Financial Resource Strain: Low Risk   . Difficulty of Paying Living Expenses: Not hard at all  Food Insecurity: No Food Insecurity  . Worried About Charity fundraiser in the Last Year: Never true  . Ran Out of Food in the Last Year: Never true  Transportation Needs: No Transportation Needs  . Lack of Transportation (Medical): No  . Lack of Transportation (Non-Medical): No  Physical Activity: Inactive  . Days of Exercise per Week: 0 days  . Minutes of Exercise per Session: 0 min  Stress: No Stress Concern Present  . Feeling of Stress : Not at all  Social Connections: Moderately Isolated  . Frequency of Communication with Friends and Family: More than three times a week  . Frequency of Social Gatherings with Friends and Family: Once a week  . Attends Religious Services: Never  . Active Member of Clubs or Organizations: No  . Attends Archivist Meetings: Never  . Marital Status: Married    Tobacco Counseling Counseling given: Not Answered   Clinical Intake:  Pre-visit preparation completed: Yes  Pain : 0-10 Pain Score: 10-Worst pain ever Pain Type: Chronic pain Pain Location: Head Pain Radiating Towards: neck, shoulders and upper back Pain Descriptors / Indicators: Throbbing,Sharp,Discomfort,Pounding Pain Onset: More than a month ago Pain Frequency: Intermittent Pain Relieving Factors: Methocarbamol, Apap-Butalbital Effect of Pain on Daily Activities: Pain can diminish job performance, lower motivation to exercise, and prevent you from completing daily tasks. Pain produces disability and affects the quality of life.  Pain Relieving Factors: Methocarbamol, Apap-Butalbital  BMI - recorded: 38.97 Nutritional Status: BMI > 30  Obese Nutritional Risks: None Diabetes: Yes CBG done?: No Did pt. bring in CBG monitor from home?:  No  How often do you need to have someone  help you when you read instructions, pamphlets, or other written materials from your doctor or pharmacy?: 1 - Never What is the last grade level you completed in school?: High School Graduate  Diabetic? yes  Interpreter Needed?: No  Information entered by :: Lisette Abu, LPN   Activities of Daily Living In your present state of health, do you have any difficulty performing the following activities: 04/13/2020 12/07/2019  Hearing? N N  Vision? N N  Difficulty concentrating or making decisions? N N  Walking or climbing stairs? N N  Dressing or bathing? N N  Doing errands, shopping? N N  Preparing Food and eating ? N -  Using the Toilet? N -  In the past six months, have you accidently leaked urine? Y -  Do you have problems with loss of bowel control? Y -  Managing your Medications? N -  Managing your Finances? N -  Housekeeping or managing your Housekeeping? N -  Some recent data might be hidden    Patient Care Team: Binnie Rail, MD as PCP - General (Internal Medicine) Sueanne Margarita, MD as PCP - Cardiology (Cardiology) Grayland Ormond, MD as Referring Physician (Neurosurgery) Gatha Mayer, MD as Consulting Physician (Gastroenterology) Jackelyn Poling, MD as Referring Physician (Psychiatry)  Indicate any recent Medical Services you may have received from other than [facility] providers in the past year (date may be approximate).     Assessment:   This is a routine wellness examination for [place].  Hearing/Vision screen No exam data present  Dietary issues and exercise activities discussed: Current Exercise Habits: The patient does not participate in regular exercise at present, Exercise limited by: respiratory conditions(s);Other - see comments;neurologic condition(s);orthopedic condition(s) (IBS)  Goals    . Patient Stated     My goal for this year is to feel better and to get off of some of my medications.    . stay as  healthy and as independent as possible      Depression Screen PHQ 2/9 Scores 04/13/2020 11/19/2016 04/26/2015  PHQ - 2 Score 0 2 1  PHQ- 9 Score - 8 -    Fall Risk Fall Risk  04/13/2020 03/30/2019 11/19/2016 10/20/2015 04/26/2015  Falls in the past year? 0 0 Yes No No  Number falls in past yr: 0 0 1 - -  Injury with Fall? 0 - - - -  Risk for fall due to : No Fall Risks - - - -  Follow up - - Falls prevention discussed - -    FALL RISK PREVENTION PERTAINING TO THE HOME:  Any stairs in or around the home? Yes  If so, are there any without handrails? No  Home free of loose throw rugs in walkways, pet beds, electrical cords, etc? Yes  Adequate lighting in your home to reduce risk of falls? Yes   ASSISTIVE DEVICES UTILIZED TO PREVENT FALLS:  Life alert? No  Use of a cane, walker or w/c? No  Grab bars in the bathroom? No  Shower chair or bench in shower? Yes  Elevated toilet seat or a handicapped toilet? Yes   TIMED UP AND GO:  Was the test performed? No .  Length of time to ambulate 10 feet: 0 sec.   Gait steady and fast without use of assistive device  Cognitive Function: Normal cognitive status assessed by direct observation by this Nurse Health Advisor. No abnormalities found.   MMSE - Mini Mental State Exam 11/19/2016  Orientation to time 5  Orientation to Place 5  Registration 3  Attention/ Calculation 4  Recall 1  Language- name 2 objects 2  Language- repeat 1  Language- follow 3 step command 3  Language- read & follow direction 1  Write a sentence 1  Copy design 1  Total score 27        Immunizations Immunization History  Administered Date(s) Administered  . PFIZER(Purple Top)SARS-COV-2 Vaccination 05/01/2019, 05/27/2019    TDAP status: Due, Education has been provided regarding the importance of this vaccine. Advised may receive this vaccine at local pharmacy or Health Dept. Aware to provide a copy of the vaccination record if obtained from local pharmacy or  Health Dept. Verbalized acceptance and understanding.  (Patient declined)  Flu Vaccine status: Declined, Education has been provided regarding the importance of this vaccine but patient still declined. Advised may receive this vaccine at local pharmacy or Health Dept. Aware to provide a copy of the vaccination record if obtained from local pharmacy or Health Dept. Verbalized acceptance and understanding.  Pneumococcal vaccine status: Declined,  Education has been provided regarding the importance of this vaccine but patient still declined. Advised may receive this vaccine at local pharmacy or Health Dept. Aware to provide a copy of the vaccination record if obtained from local pharmacy or Health Dept. Verbalized acceptance and understanding.   Covid-19 vaccine status: Completed vaccines  Qualifies for Shingles Vaccine? Yes   Zostavax completed No   Shingrix Completed?: No.    Education has been provided regarding the importance of this vaccine. Patient has been advised to call insurance company to determine out of pocket expense if they have not yet received this vaccine. Advised may also receive vaccine at local pharmacy or Health Dept. Verbalized acceptance and understanding.  Screening Tests Health Maintenance  Topic Date Due  . PNA vac Low Risk Adult (1 of 2 - PCV13) Never done  . FOOT EXAM  11/19/2017  . COVID-19 Vaccine (3 - Booster for Pfizer series) 11/26/2019  . HEMOGLOBIN A1C  04/01/2020  . INFLUENZA VACCINE  05/26/2020 (Originally 09/27/2019)  . DEXA SCAN  09/29/2020 (Originally 03/15/2010)  . TETANUS/TDAP  09/29/2020 (Originally 03/15/1964)  . OPHTHALMOLOGY EXAM  07/20/2020  . COLONOSCOPY (Pts 45-38yrs Insurance coverage will need to be confirmed)  04/01/2030  . Hepatitis C Screening  Completed    Health Maintenance  Health Maintenance Due  Topic Date Due  . PNA vac Low Risk Adult (1 of 2 - PCV13) Never done  . FOOT EXAM  11/19/2017  . COVID-19 Vaccine (3 - Booster for Pfizer  series) 11/26/2019  . HEMOGLOBIN A1C  04/01/2020    Colorectal cancer screening: Type of screening: Colonoscopy. Completed 04/01/2020. Repeat every 10 years  Mammogram status: No longer required due to refusal from patient.  Bone density status: Never done  Lung Cancer Screening: (Low Dose CT Chest recommended if Age 48-80 years, 30 pack-year currently smoking OR have quit w/in 15years.) does qualify.   Lung Cancer Screening Referral: no  Additional Screening:  Hepatitis C Screening: does qualify; Completed yes  Vision Screening: Recommended annual ophthalmology exams for early detection of glaucoma and other disorders of the eye. Is the patient up to date with their annual eye exam?  Yes  Who is the provider or what is the name of the office in which the patient attends annual eye exams? [facility] If pt is not established with a provider, would they like to be referred to a provider to establish care? No .  Dental Screening: Recommended annual dental exams for proper oral hygiene  Community Resource Referral / Chronic Care Management: CRR required this visit?  No   CCM required this visit?  No      Plan:     I have personally reviewed and noted the following in the patient's chart:   . Medical and social history . Use of alcohol, tobacco or illicit drugs  . Current medications and supplements . Functional ability and status . Nutritional status . Physical activity . Advanced directives . List of other physicians . Hospitalizations, surgeries, and ER visits in previous 12 months . Vitals . Screenings to include cognitive, depression, and falls . Referrals and appointments  In addition, I have reviewed and discussed with patient certain preventive protocols, quality metrics, and best practice recommendations. A written personalized care plan for preventive services as well as general preventive health recommendations were provided to patient.     Sheral Flow, LPN   [id number]/79/[id number]   Nurse Notes:  Medications reviewed with patient; no opioid use.

## 2020-04-14 ENCOUNTER — Telehealth: Payer: Self-pay | Admitting: Internal Medicine

## 2020-04-14 DIAGNOSIS — K623 Rectal prolapse: Secondary | ICD-10-CM

## 2020-04-14 DIAGNOSIS — R159 Full incontinence of feces: Secondary | ICD-10-CM

## 2020-04-14 NOTE — Telephone Encounter (Signed)
I called her to review this.  I explained that people do not actually defecate feces but a gel is inserted into the rectum and is expelled into a diaper.  After hearing this she is willing to proceed with MR defecography.  I told her my staff would call to schedule that regarding rectal prolapse and fecal incontinence

## 2020-04-15 NOTE — Telephone Encounter (Signed)
Order has been placed.  She will need a BUN and Creatinine.  She understands to come for labs prior to MRI and that she will be contacted directly by GI with an appointment date amd time

## 2020-04-26 ENCOUNTER — Other Ambulatory Visit: Payer: Self-pay | Admitting: Internal Medicine

## 2020-05-01 ENCOUNTER — Other Ambulatory Visit: Payer: Medicare Other

## 2020-05-04 ENCOUNTER — Inpatient Hospital Stay: Admission: RE | Admit: 2020-05-04 | Payer: Medicare Other | Source: Ambulatory Visit

## 2020-05-06 ENCOUNTER — Ambulatory Visit
Admission: RE | Admit: 2020-05-06 | Discharge: 2020-05-06 | Disposition: A | Discharge status: Home / Self Care | Payer: Medicare Other | Source: Ambulatory Visit | Attending: Surgery | Admitting: Surgery

## 2020-05-06 DIAGNOSIS — I712 Thoracic aortic aneurysm, without rupture, unspecified: Secondary | ICD-10-CM

## 2020-05-06 MED ORDER — IOPAMIDOL (ISOVUE-370) INJECTION 76%
60.0000 mL | Freq: Once | INTRAVENOUS | Status: AC | PRN
  Administered 2020-05-06: 60 mL via INTRAVENOUS

## 2020-05-09 ENCOUNTER — Other Ambulatory Visit: Payer: Self-pay

## 2020-05-09 ENCOUNTER — Encounter: Payer: Self-pay | Admitting: Surgery

## 2020-05-09 ENCOUNTER — Ambulatory Visit: Payer: Medicare Other | Admitting: Surgery

## 2020-05-09 VITALS — BP 146/81 | HR 100 | Temp 97.1°F

## 2020-05-09 DIAGNOSIS — M7989 Other specified soft tissue disorders: Secondary | ICD-10-CM | POA: Diagnosis not present

## 2020-05-09 DIAGNOSIS — I712 Thoracic aortic aneurysm, without rupture, unspecified: Secondary | ICD-10-CM

## 2020-05-09 NOTE — Progress Notes (Signed)
[facility] [place]  Patient name: Kerry Powell MRN: [id number] DOB: 1946-01-03 Sex: female   REASON FOR VISIT:    Follow up  HISOTRY OF PRESENT ILLNESS:    Kerry Powell is a 75 y.o. female, who I initially met last year for evaluation of PAD.  At that time the patient had a abdominal CT scan that showed a accessory right renal artery stenosis and an occluded left renal artery with an atrophic left kidney.  No intervention was recommended.  The patient was also dealing with leg swelling, for which I recommended compression stockings.  She was having a thoracic aortic aneurysm followed by [facility].  She recently had a CT scan which showed significant thoracic aortic pathology and so she is here today to discuss this with me.   The patient is medically managed for hypertension. She says her blood pressure has been well controlled.  She takes a statin for hypercholesterolemia. She is a diabetic. [name] She has chronic renal insufficiency. She has a history of stroke.  She does complain of swelling in both legs   PAST MEDICAL HISTORY:   Past Medical History:  Diagnosis Date  . Allergy   . Anemia   . Anxiety   . Arthritis   . BACK PAIN, CHRONIC 04/04/2007   Qualifier: Diagnosis of  By: Carlean Purl MD, Dimas Millin BIPOLAR AFFECTIVE DISORDER 04/04/2007   Qualifier: History of  By: Carlean Purl MD, Dimas Millin   . CAD (coronary artery disease), native coronary artery    mild CAD with no obstructive disease by coronary CTA 06/2019  . Cataract    removed both eyes  . Chronic neck pain 04/30/2015   Cervical spondylosis [facility] Intermittent numbness and tingling in occipital region and headaches S/p 2 occipital nerve blocks - only temp relief no relief with gabapentin, ultram, brace MRI neck - facet arthrosis   . CKD (chronic kidney disease) 04/26/2015  . COPD (chronic obstructive pulmonary disease) ([place])    pt  denies - told this while was smoking -  . Cough 04/26/2015  . Depression 07/01/2017  . Diabetes ([place]) 04/27/2015   Diagnosed 03/2015   . Dizziness 06/10/2017  . Dysphagia 04/26/2015  . DYSPNEA 12/24/2007   Qualifier: Diagnosis of  By: Glennis Brink    . Elevated TSH 08/09/2015  . GASTRIC ULCER, ACUTE 04/07/2007   Annotation: EGD 04/07/07 Qualifier: Diagnosis of  By: Carlean Purl MD, FACG, [place] 04/07/2007   Annotation: EGD 04/07/07 Qualifier: Diagnosis of  By: Carlean Purl MD, Dimas Millin   . GERD (gastroesophageal reflux disease)   . H/O renal cell carcinoma 05/23/2017   Dx 2010, s/p cryoablation Follows with urology - Dr Lovena Neighbours  . HIATAL HERNIA 04/07/2007   Annotation: EGD 04/07/07 Qualifier: Diagnosis of  By: Carlean Purl MD, Dimas Millin History of CVA (cerebrovascular accident) 04/26/2015  . History of gout 04/26/2015  . Hyperlipidemia   . Hypertension   . Irritable bowel syndrome 04/04/2007   Qualifier: Diagnosis of  By: Carlean Purl MD, Dimas Millin Kidney tumor (benign)   . Nausea and vomiting 09/09/2017  . 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 OSTEOARTHRITIS 04/04/2007   Qualifier: Diagnosis of  By: Carlean Purl MD, Dimas Millin Personal history of colonic adenoma 06/07/2012  . POSTNASAL DRIP SYNDROME 12/29/2007   Qualifier: Diagnosis  of  By: Chase Caller MD, [name]    . RESTLESS LEG SYNDROME 04/04/2007   Qualifier: Diagnosis of  By: Carlean Purl MD, FACG, [place], 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   Qualifier: Diagnosis of  By: Carlean Purl MD, Dimas Millin Sleep difficulties 09/09/2017  . Snoring 04/26/2015  . Stroke [facility])    x2 2011  . Thoracic aortic aneurysm ([place]) 02/06/2018   Seen on MRI 01/25/18 of thoracic spine ordered by [facility] -- referred to CTS to monitor  -- 3.5 cm  . TIA (transient ischemic attack)   . TOBACCO ABUSE 12/17/2007   Qualifier: Diagnosis of  By: Chase Caller MD, [name]    . Trigeminal neuralgia of right side  of face 02/08/2016  . URI (upper respiratory infection) 02/08/2016  . Vitamin D deficiency 03/12/2018     FAMILY HISTORY:   Family History  Problem Relation Age of Onset  . Congestive Heart Failure Mother   . Cancer Mother        ovarian  . Heart disease Father   . Diabetes Father   . Kidney disease Father   . Diabetes Sister   . Kidney disease Sister   . Colon cancer Neg Hx   . Colon polyps Neg Hx   . Esophageal cancer Neg Hx   . Rectal cancer Neg Hx   . Stomach cancer Neg Hx     SOCIAL HISTORY:   Social History   Tobacco Use  . Smoking status: Former Smoker    Packs/day: 1.00    Years: 46.00    Pack years: 46.00    Quit date: 06/30/2009    Years since quitting: 10.8  . Smokeless tobacco: Never Used  Substance Use Topics  . Alcohol use: No     ALLERGIES:   Allergies  Allergen Reactions  . Gabapentin Other (See Comments)    Fluid retention   . Clindamycin Rash  . Sulfa Antibiotics Nausea And Vomiting     CURRENT MEDICATIONS:   Current Outpatient Medications  Medication Sig Dispense Refill  . ACETAMINOPHEN-BUTALBITAL 50-325 MG TABS TAKE 1 TABLET BY MOUTH EVERY DAY AS NEEDED (Patient taking differently: TAKE 1 TABLET by mouth once a week) 120 tablet 3  . allopurinol (ZYLOPRIM) 300 MG tablet TAKE 1 TABLET BY MOUTH EVERY DAY 90 tablet 1  . amLODipine (NORVASC) 5 MG tablet Take 1.5 tablets (7.5 mg total) by mouth daily. 135 tablet 1  . cetirizine (ZYRTEC ALLERGY) 10 MG tablet Take 1 tablet (10 mg total) by mouth daily. 30 tablet 11  . cholecalciferol (VITAMIN D3) 25 MCG (1000 UNIT) tablet Take 2,000 Units by mouth daily.    . clopidogrel (PLAVIX) 75 MG tablet TAKE 1 TABLET BY MOUTH EVERY DAY 90 tablet 1  . DULoxetine (CYMBALTA) 60 MG capsule TAKE 1 CAPSULE BY MOUTH EVERY DAY 90 capsule 1  . ferrous sulfate 325 (65 FE) MG tablet Take 325 mg by mouth daily with breakfast.    . fluticasone (FLONASE) 50 MCG/ACT nasal spray Place 2 sprays into both nostrils daily.  11.1 mL 11  . furosemide (LASIX) 20 MG tablet Take 20 mg by mouth. 3 times a week as needed for swelling    . ipratropium-albuterol (DUONEB) 0.5-2.5 (3) MG/3ML SOLN Take 3 mLs by nebulization 4 (four) times daily. 360 mL 5  . metFORMIN (GLUCOPHAGE) 500 MG tablet Take 1 tablet (500 mg total) by mouth 2 (two) times daily with a meal. 180 tablet 3  . methocarbamol (ROBAXIN) 500 MG  tablet Take 1 tablet (500 mg total) by mouth 2 (two) times daily as needed for muscle spasms. 60 tablet 5  . metoprolol succinate (TOPROL-XL) 50 MG 24 hr tablet Take 50 mg by mouth daily.    . montelukast (SINGULAIR) 10 MG tablet Take 1 tablet (10 mg total) by mouth at bedtime. 30 tablet 11  . pantoprazole (PROTONIX) 40 MG tablet Take 1 tablet (40 mg total) by mouth 2 (two) times daily before a meal. 180 tablet 1  . rosuvastatin (CRESTOR) 40 MG tablet Take 1 tablet (40 mg total) by mouth daily. 90 tablet 3  . telmisartan (MICARDIS) 40 MG tablet Take 40 mg by mouth daily.    [name] umeclidinium-vilanterol (ANORO ELLIPTA) 62.5-25 MCG/INH AEPB Inhale 1 puff into the lungs daily. 60 each 5  . Dextromethorphan Polistirex (DELSYM PO) Take by mouth.    . Phenylephrine-DM-GG (MUCINEX FAST-MAX CONGEST COUGH PO) Take by mouth.     No current facility-administered medications for this visit.    REVIEW OF SYSTEMS:   [X]  denotes positive finding, [ ]  denotes negative finding Cardiac  Comments:  Chest pain or chest pressure:    Shortness of breath upon exertion:    Short of breath when lying flat:    Irregular heart rhythm:        Vascular    Pain in calf, thigh, or hip brought on by ambulation:    Pain in feet at night that wakes you up from your sleep:     Blood clot in your veins:    Leg swelling:  x       Pulmonary    Oxygen at home:    Productive cough:     Wheezing:         Neurologic    Sudden weakness in arms or legs:     Sudden numbness in arms or legs:     Sudden onset of difficulty speaking or slurred speech:     Temporary loss of vision in one eye:     Problems with dizziness:         Gastrointestinal    Blood in stool:     Vomited blood:         Genitourinary    Burning when urinating:     Blood in urine:        Psychiatric    Major depression:         Hematologic    Bleeding problems:    Problems with blood clotting too easily:        Skin    Rashes or ulcers:        Constitutional    Fever or chills:      PHYSICAL EXAM:   Vitals:   05/09/20 1342  BP: (!) 146/81  Pulse: 100  Temp: (!) 97.1 F (36.2 C)  TempSrc: Skin  SpO2: 97%    GENERAL: The patient is a well-nourished female, in no acute distress. The vital signs are documented above. CARDIAC: There is a regular rate and rhythm.  VASCULAR: Palpable pedal pulses bilaterally.  1+ pitting edema. PULMONARY: Non-labored respirations MUSCULOSKELETAL: There are no major deformities or cyanosis. NEUROLOGIC: No focal weakness or paresthesias are detected. SKIN: There are no ulcers or rashes noted. PSYCHIATRIC: The patient has a normal affect.  STUDIES:   I have reviewed the following CT: 1. No significant interval change in the appearance or extent of extensive, irregular and ulcerated fibrofatty atherosclerotic plaque throughout the aortic isthmus and descending thoracic aorta.  No evidence of aortic aneurysm or dissection. 2. Stable biapical pleuroparenchymal scarring. 3. Coronary artery calcifications. 4. Stable chronic left renal atrophy. 5. Advanced centrilobular pulmonary emphysema.  MEDICAL ISSUES:   Thoracic aorta: There has been no significant changes in the extensive irregularities throughout her descending thoracic aorta.  There is no evidence of dissection or aneurysm.  I will continue to monitor this for progression of disease.  No intervention is recommended.  I will repeat her CT scan in 2 years.  Known left renal artery occlusion: The right kidney remains well perfused.  Her blood pressure appears to  be well controlled so I do not think that she needs nephrectomy at this time for hypertension control.  Leg swelling: We again discussed wearing thigh-high compression socks.    Leia Alf, MD, FACS [facility] [phone] Pager [phone]

## 2020-05-18 ENCOUNTER — Ambulatory Visit: Payer: Medicare Other | Admitting: Internal Medicine

## 2020-05-20 ENCOUNTER — Ambulatory Visit
Admission: RE | Admit: 2020-05-20 | Discharge: 2020-05-20 | Disposition: A | Discharge status: Home / Self Care | Payer: Medicare Other | Source: Ambulatory Visit | Attending: Internal Medicine | Admitting: Internal Medicine

## 2020-05-20 ENCOUNTER — Other Ambulatory Visit: Payer: Self-pay

## 2020-05-20 DIAGNOSIS — R159 Full incontinence of feces: Secondary | ICD-10-CM

## 2020-05-20 DIAGNOSIS — K623 Rectal prolapse: Secondary | ICD-10-CM

## 2020-05-31 ENCOUNTER — Other Ambulatory Visit: Payer: Self-pay

## 2020-05-31 ENCOUNTER — Encounter: Payer: Self-pay | Admitting: Cardiology

## 2020-05-31 ENCOUNTER — Ambulatory Visit: Payer: Medicare Other | Admitting: Cardiology

## 2020-05-31 VITALS — BP 124/78 | HR 79 | Ht 65.0 in | Wt 232.8 lb

## 2020-05-31 DIAGNOSIS — I2510 Atherosclerotic heart disease of native coronary artery without angina pectoris: Secondary | ICD-10-CM

## 2020-05-31 DIAGNOSIS — I712 Thoracic aortic aneurysm, without rupture, unspecified: Secondary | ICD-10-CM

## 2020-05-31 DIAGNOSIS — E7800 Pure hypercholesterolemia, unspecified: Secondary | ICD-10-CM

## 2020-05-31 DIAGNOSIS — I10 Essential (primary) hypertension: Secondary | ICD-10-CM

## 2020-05-31 NOTE — Patient Instructions (Signed)

## 2020-05-31 NOTE — Progress Notes (Signed)
Cardiology Office Note:    Date:  05/31/2020   ID:  Kerry Powell, DOB June 10, 1945, MRN [id number]  PCP:  Binnie Rail, MD  Cardiologist:  Fransico Him, MD    Referring MD: Binnie Rail, MD   Chief Complaint  Patient presents with  . Follow-up    Thoracic aortic aneurysm, CAD, HTN, HLD    History of Present Illness:    Kerry Powell is a 75 y.o. female with a hx of Bipolar disorder, CKD, DOPD, DM2, HTN, HLD, GERD, CVA and aortic aneurysm.  She was first seen by me a year ago for evaluation of thoracic descending aortic aneurysm that was 3.5cm.  Chest CTA was done showing occluded left renal artery, high grade stenosis of the superior right renal artery, extensive atheromatous plaque and nonocclusive mural thrombus in the distal descending thoracic and abdominal aorta without aneurysm or stenosis and scattered plaque in the iliac arterial systems with no stenosis.   She underwent repeat Chest CTA 03/2019 showing developing plaque-like region of probable pleuroparenchymal scarring at the left lung apex compared to relatively remote prior imaging from September of 2012. However, due to the change in appearance since the prior study and the patient's underlying emphysema and presumed smoking history, a developing malignancy such as a Pancoast tumor was difficult to exclude entirely. There was extensive, irregular and ulcerated fibrofatty atherosclerotic plaque throughout the descending thoracic aorta with multifocal areas of small penetrating atherosclerotic ulcers. No evidence of aneurysm or dissection. There were also coronary calcifications noted. She is followed yearly by Dr. Arita Miss for her PVD.  It was recommended that we repeat Chest CT in 6-10 weeks for followup.  She was seen by Pulmonary for SOB and abnormal chest CT.  Dr. Tamala Julian reviewed Chest CT and felt it was likely benign but agreed with followup CT in 3 months and that has been ordered by PCp and pulmonary who will be following up  on it.   When I saw her last she was having increased DOE and underwent coronary CTA in 2021 and showed showed mRCA 25-49% and ostial LM and then pLAD and LCs 0-24%. It was felt her DOE was related to COPD.  She had a chest CTA done last month which showed no aortic aneurysm and just aortic atherosclerosis and advanced COPD.  SDHe is here today for followup and is doing well.  She has chronic DOE with activity related to COPD and follows with Pulmonary.  She has chronic LE edema which is stable. She denies any chest pain or pressure, PND, orthopnea,  dizziness, palpitations or syncope. She is compliant with her meds and is tolerating meds with no SE.    Past Medical History:  Diagnosis Date  . Allergy   . Anemia   . Anxiety   . Arthritis   . BACK PAIN, CHRONIC 04/04/2007   Qualifier: Diagnosis of  By: Carlean Purl MD, Dimas Millin BIPOLAR AFFECTIVE DISORDER 04/04/2007   Qualifier: History of  By: Carlean Purl MD, Dimas Millin   . CAD (coronary artery disease), native coronary artery    mild CAD with no obstructive disease by coronary CTA 06/2019  . Cataract    removed both eyes  . Chronic neck pain 04/30/2015   Cervical spondylosis [facility] Intermittent numbness and tingling in occipital region and headaches S/p 2 occipital nerve blocks - only temp relief no relief with gabapentin, ultram, brace MRI neck - facet arthrosis   . CKD (chronic kidney  disease) 04/26/2015  . COPD (chronic obstructive pulmonary disease) ([place])    pt denies - told this while was smoking -  . Cough 04/26/2015  . Depression 07/01/2017  . Diabetes ([place]) 04/27/2015   Diagnosed 03/2015   . Dizziness 06/10/2017  . Dysphagia 04/26/2015  . DYSPNEA 12/24/2007   Qualifier: Diagnosis of  By: Glennis Brink    . Elevated TSH 08/09/2015  . GASTRIC ULCER, ACUTE 04/07/2007   Annotation: EGD 04/07/07 Qualifier: Diagnosis of  By: Carlean Purl MD, FACG, Pablo 04/07/2007   Annotation: EGD 04/07/07 Qualifier: Diagnosis of  By:  Carlean Purl MD, Dimas Millin   . GERD (gastroesophageal reflux disease)   . H/O renal cell carcinoma 05/23/2017   Dx 2010, s/p cryoablation Follows with urology - Dr Lovena Neighbours  . HIATAL HERNIA 04/07/2007   Annotation: EGD 04/07/07 Qualifier: Diagnosis of  By: Carlean Purl MD, Dimas Millin History of CVA (cerebrovascular accident) 04/26/2015  . History of gout 04/26/2015  . Hyperlipidemia   . Hypertension   . Irritable bowel syndrome 04/04/2007   Qualifier: Diagnosis of  By: Carlean Purl MD, Dimas Millin Kidney tumor (benign)   . Nausea and vomiting 09/09/2017  . 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 OSTEOARTHRITIS 04/04/2007   Qualifier: Diagnosis of  By: Carlean Purl MD, Dimas Millin Personal history of colonic adenoma 06/07/2012  . POSTNASAL DRIP SYNDROME 12/29/2007   Qualifier: Diagnosis of  By: Chase Caller MD, [name]    . RESTLESS LEG SYNDROME 04/04/2007   Qualifier: Diagnosis of  By: Carlean Purl MD, FACG, August, 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   Qualifier: Diagnosis of  By: Carlean Purl MD, Dimas Millin Sleep difficulties 09/09/2017  . Snoring 04/26/2015  . Stroke [facility])    x2 2011  . Thoracic aortic aneurysm ([place]) 02/06/2018   Seen on MRI 01/25/18 of thoracic spine ordered by [facility] -- referred to CTS to monitor  -- 3.5 cm  . TIA (transient ischemic attack)   . TOBACCO ABUSE 12/17/2007   Qualifier: Diagnosis of  By: Chase Caller MD, [name]    . Trigeminal neuralgia of right side of face 02/08/2016  . URI (upper respiratory infection) 02/08/2016  . Vitamin D deficiency 03/12/2018    Past Surgical History:  Procedure Laterality Date  . ABDOMINAL HYSTERECTOMY    . APPENDECTOMY    . CATARACT EXTRACTION    . COLONOSCOPY    . CYSTOCELE REPAIR    . ESOPHAGOGASTRODUODENOSCOPY    . KIDNEY SURGERY     benign tumor removal  . ORTHOPEDIC SURGERY     Wrist & elbow  . POLYPECTOMY    . RECTOCELE REPAIR    . TONSILECTOMY, ADENOIDECTOMY, BILATERAL  MYRINGOTOMY AND TUBES Bilateral 1968  . UPPER GASTROINTESTINAL ENDOSCOPY      Current Medications: Current Meds  Medication Sig  . ACETAMINOPHEN-BUTALBITAL 50-325 MG TABS TAKE 1 TABLET BY MOUTH EVERY DAY AS NEEDED  . allopurinol (ZYLOPRIM) 300 MG tablet TAKE 1 TABLET BY MOUTH EVERY DAY  . amLODipine (NORVASC) 5 MG tablet Take 1.5 tablets (7.5 mg total) by mouth daily.  . cetirizine (ZYRTEC ALLERGY) 10 MG tablet Take 1 tablet (10 mg total) by mouth daily.  . cholecalciferol (VITAMIN D3) 25 MCG (1000 UNIT) tablet Take 2,000 Units by mouth daily.  . clopidogrel (PLAVIX) 75 MG tablet TAKE 1 TABLET BY MOUTH EVERY DAY  .  Dextromethorphan Polistirex (DELSYM PO) Take by mouth.  . DULoxetine (CYMBALTA) 60 MG capsule TAKE 1 CAPSULE BY MOUTH EVERY DAY  . ferrous sulfate 325 (65 FE) MG tablet Take 325 mg by mouth daily with breakfast.  . fluticasone (FLONASE) 50 MCG/ACT nasal spray Place 2 sprays into both nostrils daily.  . furosemide (LASIX) 20 MG tablet Take 20 mg by mouth. 3 times a week as needed for swelling  . ipratropium-albuterol (DUONEB) 0.5-2.5 (3) MG/3ML SOLN Take 3 mLs by nebulization 4 (four) times daily.  . metFORMIN (GLUCOPHAGE) 500 MG tablet Take 1 tablet (500 mg total) by mouth 2 (two) times daily with a meal.  . methocarbamol (ROBAXIN) 500 MG tablet Take 1 tablet (500 mg total) by mouth 2 (two) times daily as needed for muscle spasms.  . metoprolol succinate (TOPROL-XL) 50 MG 24 hr tablet Take 50 mg by mouth daily.  . montelukast (SINGULAIR) 10 MG tablet Take 1 tablet (10 mg total) by mouth at bedtime.  . pantoprazole (PROTONIX) 40 MG tablet Take 1 tablet (40 mg total) by mouth 2 (two) times daily before a meal.  . Phenylephrine-DM-GG (MUCINEX FAST-MAX CONGEST COUGH PO) Take by mouth.  . rosuvastatin (CRESTOR) 40 MG tablet Take 1 tablet (40 mg total) by mouth daily.  [name] telmisartan (MICARDIS) 40 MG tablet Take 40 mg by mouth daily.  [name] umeclidinium-vilanterol (ANORO ELLIPTA) 62.5-25  MCG/INH AEPB Inhale 1 puff into the lungs daily.     Allergies:   Gabapentin, Clindamycin, and Sulfa antibiotics   Social History   Socioeconomic History  . Marital status: Married    Spouse name: Not on file  . Number of children: 2  . Years of education: Not on file  . Highest education level: Not on file  Occupational History  . Occupation: Retired  Tobacco Use  . Smoking status: Former Smoker    Packs/day: 1.00    Years: 46.00    Pack years: 46.00    Quit date: 06/30/2009    Years since quitting: 10.9  . Smokeless tobacco: Never Used  Vaping Use  . Vaping Use: Never used  Substance and Sexual Activity  . Alcohol use: No  . Drug use: No  . Sexual activity: Not on file  Other Topics Concern  . Not on file  Social History Narrative   Married, retired   1 son 1 daughter   2 caffeine/day   Social Determinants of Health   Financial Resource Strain: Low Risk   . Difficulty of Paying Living Expenses: Not hard at all  Food Insecurity: No Food Insecurity  . Worried About Charity fundraiser in the Last Year: Never true  . Ran Out of Food in the Last Year: Never true  Transportation Needs: No Transportation Needs  . Lack of Transportation (Medical): No  . Lack of Transportation (Non-Medical): No  Physical Activity: Inactive  . Days of Exercise per Week: 0 days  . Minutes of Exercise per Session: 0 min  Stress: No Stress Concern Present  . Feeling of Stress : Not at all  Social Connections: Moderately Isolated  . Frequency of Communication with Friends and Family: More than three times a week  . Frequency of Social Gatherings with Friends and Family: Once a week  . Attends Religious Services: Never  . Active Member of Clubs or Organizations: No  . Attends Archivist Meetings: Never  . Marital Status: Married     Family History: The patient's family history includes Cancer in her  mother; Congestive Heart Failure in her mother; Diabetes in her father and  sister; Heart disease in her father; Kidney disease in her father and sister. There is no history of Colon cancer, Colon polyps, Esophageal cancer, Rectal cancer, or Stomach cancer.  ROS:   Please see the history of present illness.    ROS  All other systems reviewed and negative.   EKGs/Labs/Other Studies Reviewed:    The following studies were reviewed today: Coronary CTA 06/2019 FINDINGS: A 120 kV prospective scan was triggered in the descending thoracic aorta at 111 HU's. Axial non-contrast 3 mm slices were carried out through the heart. The data set was analyzed on a dedicated work station and scored using the [place]. Gantry rotation speed was 250 msecs and collimation was .6 mm. No beta blockade and 0.8 mg of sl NTG was given. The 3D data set was reconstructed in 5% intervals of the 67-82 % of the R-R cycle. Diastolic phases were analyzed on a dedicated work station using MPR, MIP and VRT modes. The patient received 80 cc of contrast.  Aorta: Mildly dilated ascending aorta at 32mm. Calcifications noted in the ascending and descending aorta. No dissection.  Aortic Valve:  Trileaflet.  Mildly calcified.  Mitral Valve: Mitral annular calcifications.  Coronary Arteries:  Normal coronary origin.  Right dominance.  RCA is a large dominant artery that gives rise to PDA and PLVB. There is a mild calcified plaque in the mid RCA with associated stenosis of 25-49%.  Left main is a large artery that gives rise to LAD and LCX arteries. There is mild calcified plaque in the ostial and mid LM with associated stenosis of 25-45%.  LAD is a large vessel that a large D1. There is minimal calcified plaque in the proximal and mid LAD with associated stenosis of 0-24%.  LCX is a non-dominant artery that gives rise to one small OM1 and large OM2 branches. There is minimal calcified plaque in the proximal and mid LCx and mid OM2 with associated stenosis of 0-24%.  Other  findings:  Normal pulmonary vein drainage into the left atrium.  Normal let atrial appendage without a thrombus.  Normal size of the pulmonary artery.  IMPRESSION: 1. Coronary calcium score of 390. This was 84th percentile for age and sex matched control.  2. Normal coronary origin with right dominance.  3.  Mild atherosclerosis.  CAD-RADS 2.  4.  Consider non-atherosclerotic causes of chest pain.  5.  Consider preventive therapy and risk factor modification.  Fransico Him   Electronically Signed   By: Fransico Him   On: 07/08/2019 14:31   Addended by Sueanne Margarita, MD on 07/08/2019 2:33 PM    Study Result  Narrative & Impression  EXAM: OVER-READ INTERPRETATION  CT CHEST  The following report is an over-read performed by radiologist Dr. Rolm Baptise of [facility], [place] on 07/02/2019. This over-read does not include interpretation of cardiac or coronary anatomy or pathology. The coronary CTA interpretation by the cardiologist is attached.  COMPARISON:  06/30/2019  FINDINGS: Vascular: Heart is normal size. Marked irregular plaque in the descending thoracic aorta. No evidence of aneurysm.  Mediastinum/Nodes: No adenopathy in the lower mediastinum or hila.  Lungs/Pleura: Visualized lungs clear.  No effusions.  Upper Abdomen: Imaging into the upper abdomen shows no acute findings.  Musculoskeletal: Chest wall soft tissues are unremarkable. No acute bony abnormality.  IMPRESSION: Markedly irregular calcified and noncalcified plaque in the descending thoracic aorta.  EKG:  EKG is ordered today and showed NSR with no ST changes  Recent Labs: 09/30/2019: ALT 16; BUN 21; Creat 1.44; Potassium 4.9; Sodium 137; TSH 2.12 03/07/2020: Hemoglobin 13.1; Platelets 283.0   Recent Lipid Panel    Component Value Date/Time   CHOL 132 09/30/2019 1532   CHOL 132 08/10/2019 1110   TRIG 111 09/30/2019 1532   HDL 44 (L) 09/30/2019 1532    HDL 35 (L) 08/10/2019 1110   CHOLHDL 3.0 09/30/2019 1532   VLDL 21.4 03/30/2019 1504   LDLCALC 69 09/30/2019 1532   LDLDIRECT 92.0 03/12/2018 1407    Physical Exam:    VS:  BP 124/78   Pulse 79   Ht 5\' 5"  (1.651 m)   Wt 232 lb 12.8 oz (105.6 kg)   SpO2 93%   BMI 38.74 kg/m     Wt Readings from Last 3 Encounters:  05/31/20 232 lb 12.8 oz (105.6 kg)  04/13/20 234 lb 3.2 oz (106.2 kg)  04/01/20 231 lb (104.8 kg)     GEN:  Well nourished, well developed in no acute distress HEENT: Normal NECK: No JVD; No carotid bruits LYMPHATICS: No lymphadenopathy CARDIAC: RRR, no murmurs, rubs, gallops RESPIRATORY:  Clear to auscultation without rales, wheezing or rhonchi  ABDOMEN: Soft, non-tender, non-distended MUSCULOSKELETAL:  No edema; No deformity  SKIN: Warm and dry NEUROLOGIC:  Alert and oriented x 3 PSYCHIATRIC:  Normal affect   ASSESSMENT:    1. Thoracic aortic aneurysm without rupture ([place])   2. Essential hypertension, benign   3. Coronary artery disease involving native coronary artery of native heart without angina pectoris   4. Pure hypercholesterolemia    PLAN:    In order of problems listed above:  1.  Thoracic aortic aneurysm  -last Chest CT 03/2019 showed extensive irregular and ulcerated atherosclerotic plaque in the descending thoracic aorta with multifocal areas of small penetrating ulcers but no evidence of aneurysm or dissection -Abdominal CTA showed atheromatous plaque and nonocclusive mural thrombus in distal descending thoracic and abdominal aorta with no aneurysm and left renal artery occlusion and right superior renal artery with high grade stenosis. -Dr. Arita Miss is following her PVD -BP controlled -continue statin and BB  2.  HTN  -BP is controlled today -Continue amlodipine 7.5mg  daily, Toprol XL 50mg  daily and Micardis 40mg  daily.    3.  DOE/ASCAD  -this is likely related to underlying COPD which is followed by Pulmonary -when I saw her in  2021 she had problems  with worsening SOB with an intense pain between her shoulder blades when she exerted herself and at rest.   -she had a nuclear stress test 2020 for this that showed no ischemia -coronary CTA 2021 showed mRCA 25-49% and ostial LM and then pLAD and LCs 0-24% and it was felt her DOE was not related to coronary ischemia -continue Plavix 75mg  daily, statin and BB  4. HLD -LDL goal < 70  -LDL was 69 in July 2021 -continue Crestor 40mg  daily   Medication Adjustments/Labs and Tests Ordered: Current medicines are reviewed at length with the patient today.  Concerns regarding medicines are outlined above.  No orders of the defined types were placed in this encounter.  No orders of the defined types were placed in this encounter.   Signed, Fransico Him, MD  05/31/2020 2:11 PM    [place]

## 2020-06-02 ENCOUNTER — Ambulatory Visit: Payer: Medicare Other | Admitting: Internal Medicine

## 2020-06-02 ENCOUNTER — Encounter: Payer: Self-pay | Admitting: Internal Medicine

## 2020-06-02 VITALS — BP 120/72 | HR 73 | Ht 65.0 in | Wt 233.0 lb

## 2020-06-02 DIAGNOSIS — N816 Rectocele: Secondary | ICD-10-CM

## 2020-06-02 DIAGNOSIS — M6289 Other specified disorders of muscle: Secondary | ICD-10-CM | POA: Diagnosis not present

## 2020-06-02 DIAGNOSIS — R198 Other specified symptoms and signs involving the digestive system and abdomen: Secondary | ICD-10-CM | POA: Diagnosis not present

## 2020-06-02 DIAGNOSIS — R1314 Dysphagia, pharyngoesophageal phase: Secondary | ICD-10-CM

## 2020-06-02 DIAGNOSIS — N8110 Cystocele, unspecified: Secondary | ICD-10-CM

## 2020-06-02 NOTE — Patient Instructions (Signed)
We are placing a referral to Dr Ivery Quale. They will contact you to set up an appointment. If you don't hear from them in a week or so call them.   I appreciate the opportunity to care for you. Silvano Rusk, MD, [facility]

## 2020-06-02 NOTE — Progress Notes (Signed)
Kerry Powell 75 y.o. Jun 29, 1945 [id number]  Assessment & Plan:   Encounter Diagnoses  Name Primary?  . Cystocele with rectocele Yes  . Abnormal defecation   . Malfunction of anal sphincter   . Pelvic floor dysfunction in female   . Dysphagia, pharyngoesophageal phase     She has a constellation of problems that sound like pelvic floor dysfunction overall.  I explained that I am not sure how significant the abnormality seen on MRI defecography are.  I think an assessment by a urogynecologist would help [place] understand things better.  She does have a weak anal sphincter.  I think pelvic floor physical therapy would be worthwhile she is somewhat reluctant to doing that she did that through Dr. [name] at [facility] urology years ago.  She makes a face when I bring this up.  I think she will consider it and would be willing to try.  She was asking if there was some sort of quick fix to her anal sphincter problem, I told her that surgery would be a last resort in her case most likely.  We did not discuss implantation of an anal stimulator we could consider colorectal surgery referral but I would want to see what Dr. Wannetta Sender of urogynecology thinks.   She has concerns about straining hard and I explained how physical therapy could help her with that possibly.  Regarding her dysphagia she will try to manage her diet and food consistencies.  She will follow-up with her iron deficiency through Dr. Quay Burow.  I appreciate the opportunity to care for this patient. CC: Binnie Rail, MD Dr. [name] [facility]  Dr. Sherlene Shams Subjective:   Chief Complaint: Follow-up of dysphagia and fecal incontinence issues  HPI  Kerry Powell is a 75 year old woman that I saw most recently in January 2022 with dysphagia problems and complaints of irregular bowel habits and fecal incontinence.  I found decreased anal tone on rectal exam.  She complained then and complains again that she has  situations where the stool will be sensed in her rectum and that she feels something "pop out".  She thinks she is having rectal prolapse and thought that she had that in the past and that was part of what was repaired by Dr. [name] years ago (mesh repair cystocele rectocele and rectal prolapse).  I have not been able to see actual signs of rectal prolapse on examination.  At other times she has extreme urgency with little warning and will have fecal incontinence.  Stools are generally Bristol scale 4-6 it sounds like.  Her colonoscopy demonstrated 2 diminutive adenomas and a diminutive hyperplastic polyp and diverticulosis on 04/01/2020.  Because of iron deficiency and dysphagia she had an EGD as well there was a 4 cm hiatal hernia there was a ring that was dilated.  Because of persistent issues and concerned that the ring was not the answer I had her do a modified barium swallow findings as below 04/12/2020.  Clinical Impression Pt demonstrates an overall timely and functional oropharyngeal swallow with good airway protection noted with all consistencies. Trace pentetration was demonstrated x1 with thin liquids (PAS 2) in which coughing occured afterwards; however, coughing was also noted throughout PO trials that was unrelated to pentration or aspiration. Pt demonstrated swallow initation with thin liquids at the pyriform sinuses. Oral residue and poor bolus cohesion were noted across all consistencies, but oral phase still appears to be functional - question impact of ill-fitting dentures. Residue within the vallecula occurred  when swallowing a pill which pt cleared with second swallow of thin liquids. Recomend regular diet and thin liquids with pt to choose consistencies that are best suited for her given difficulty with dentures. Pt may benefit from continued f/u with GI doctor if difficullties continue to present.  SLP Visit Diagnosis Dysphagia, unspecified (R13.10)   Social history noted for husband  being in the hospital towards the end of March with diarrhea.  I printed the discharge summary she has concerns that the medications he is on or not correct she was not available when they discharged and wanted to review his medications.  He had been seen by Dr. Havery Moros of our service. Allergies  Allergen Reactions  . Gabapentin Other (See Comments)    Fluid retention   . Clindamycin Rash  . Sulfa Antibiotics Nausea And Vomiting   Current Meds  Medication Sig  . ACETAMINOPHEN-BUTALBITAL 50-325 MG TABS TAKE 1 TABLET BY MOUTH EVERY DAY AS NEEDED  . allopurinol (ZYLOPRIM) 300 MG tablet TAKE 1 TABLET BY MOUTH EVERY DAY  . amLODipine (NORVASC) 5 MG tablet Take 1.5 tablets (7.5 mg total) by mouth daily.  . cetirizine (ZYRTEC ALLERGY) 10 MG tablet Take 1 tablet (10 mg total) by mouth daily.  . cholecalciferol (VITAMIN D3) 25 MCG (1000 UNIT) tablet Take 2,000 Units by mouth daily.  . clopidogrel (PLAVIX) 75 MG tablet TAKE 1 TABLET BY MOUTH EVERY DAY  . DULoxetine (CYMBALTA) 60 MG capsule TAKE 1 CAPSULE BY MOUTH EVERY DAY  . ferrous sulfate 325 (65 FE) MG tablet Take 325 mg by mouth daily with breakfast.  . fluticasone (FLONASE) 50 MCG/ACT nasal spray Place 2 sprays into both nostrils daily.  . furosemide (LASIX) 20 MG tablet Take 20 mg by mouth. 3 times a week as needed for swelling  . ipratropium-albuterol (DUONEB) 0.5-2.5 (3) MG/3ML SOLN Take 3 mLs by nebulization 4 (four) times daily.  . metFORMIN (GLUCOPHAGE) 500 MG tablet Take 1 tablet (500 mg total) by mouth 2 (two) times daily with a meal.  . methocarbamol (ROBAXIN) 500 MG tablet Take 1 tablet (500 mg total) by mouth 2 (two) times daily as needed for muscle spasms.  . metoprolol succinate (TOPROL-XL) 50 MG 24 hr tablet Take 50 mg by mouth daily.  . montelukast (SINGULAIR) 10 MG tablet Take 1 tablet (10 mg total) by mouth at bedtime.  . pantoprazole (PROTONIX) 40 MG tablet Take 1 tablet (40 mg total) by mouth 2 (two) times daily before a  meal.  . Phenylephrine-DM-GG (MUCINEX FAST-MAX CONGEST COUGH PO) Take by mouth as needed.  . rosuvastatin (CRESTOR) 40 MG tablet Take 1 tablet (40 mg total) by mouth daily.  [name] telmisartan (MICARDIS) 40 MG tablet Take 40 mg by mouth daily.  [name] umeclidinium-vilanterol (ANORO ELLIPTA) 62.5-25 MCG/INH AEPB Inhale 1 puff into the lungs daily.   Past Medical History:  Diagnosis Date  . Allergy   . Anemia   . Anxiety   . Arthritis   . BACK PAIN, CHRONIC 04/04/2007   Qualifier: Diagnosis of  By: Carlean Purl MD, Dimas Millin BIPOLAR AFFECTIVE DISORDER 04/04/2007   Qualifier: History of  By: Carlean Purl MD, Dimas Millin   . CAD (coronary artery disease), native coronary artery    mild CAD with no obstructive disease by coronary CTA 06/2019  . Cataract    removed both eyes  . Chronic neck pain 04/30/2015   Cervical spondylosis  neurosurgery Intermittent numbness and tingling in occipital region and headaches S/p 2  occipital nerve blocks - only temp relief no relief with gabapentin, ultram, brace MRI neck - facet arthrosis   . CKD (chronic kidney disease) 04/26/2015  . COPD (chronic obstructive pulmonary disease) ([place])    pt denies - told this while was smoking -  . Cough 04/26/2015  . Depression 07/01/2017  . Diabetes ([place]) 04/27/2015   Diagnosed 03/2015   . Dizziness 06/10/2017  . Dysphagia 04/26/2015  . DYSPNEA 12/24/2007   Qualifier: Diagnosis of  By: Glennis Brink    . Elevated TSH 08/09/2015  . GASTRIC ULCER, ACUTE 04/07/2007   Annotation: EGD 04/07/07 Qualifier: Diagnosis of  By: Carlean Purl MD, FACG, [place] 04/07/2007   Annotation: EGD 04/07/07 Qualifier: Diagnosis of  By: Carlean Purl MD, Dimas Millin   . GERD (gastroesophageal reflux disease)   . H/O renal cell carcinoma 05/23/2017   Dx 2010, s/p cryoablation Follows with urology - Dr Lovena Neighbours  . HIATAL HERNIA 04/07/2007   Annotation: EGD 04/07/07 Qualifier: Diagnosis of  By: Carlean Purl MD, Dimas Millin History of CVA (cerebrovascular  accident) 04/26/2015  . History of gout 04/26/2015  . Hyperlipidemia   . Hypertension   . Irritable bowel syndrome 04/04/2007   Qualifier: Diagnosis of  By: Carlean Purl MD, Dimas Millin Kidney tumor (benign)   . Nausea and vomiting 09/09/2017  . 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 OSTEOARTHRITIS 04/04/2007   Qualifier: Diagnosis of  By: Carlean Purl MD, Dimas Millin Personal history of colonic adenoma 06/07/2012  . POSTNASAL DRIP SYNDROME 12/29/2007   Qualifier: Diagnosis of  By: Chase Caller MD, [name]    . RESTLESS LEG SYNDROME 04/04/2007   Qualifier: Diagnosis of  By: Carlean Purl MD, FACG, [place], 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   Qualifier: Diagnosis of  By: Carlean Purl MD, Dimas Millin Sleep difficulties 09/09/2017  . Snoring 04/26/2015  . Stroke [facility])    x2 2011  . Thoracic aortic aneurysm ([place]) 02/06/2018   Seen on MRI 01/25/18 of thoracic spine ordered by [facility] -- referred to CTS to monitor  -- 3.5 cm  . TIA (transient ischemic attack)   . TOBACCO ABUSE 12/17/2007   Qualifier: Diagnosis of  By: Chase Caller MD, [name]    . Trigeminal neuralgia of right side of face 02/08/2016  . URI (upper respiratory infection) 02/08/2016  . Vitamin D deficiency 03/12/2018   Past Surgical History:  Procedure Laterality Date  . ABDOMINAL HYSTERECTOMY    . APPENDECTOMY    . CATARACT EXTRACTION    . COLONOSCOPY    . CYSTOCELE REPAIR    . ESOPHAGOGASTRODUODENOSCOPY    . KIDNEY SURGERY     benign tumor removal  . ORTHOPEDIC SURGERY     Wrist & elbow  . POLYPECTOMY    . RECTOCELE REPAIR    . TONSILECTOMY, ADENOIDECTOMY, BILATERAL MYRINGOTOMY AND TUBES Bilateral 1968  . UPPER GASTROINTESTINAL ENDOSCOPY     Social History   Social History Narrative   Married, retired   1 son 1 daughter   2 caffeine/day   family history includes Cancer in her mother; Congestive Heart Failure in her mother; Diabetes in her father and sister; Heart  disease in her father; Kidney disease in her father and sister.   Review of Systems As above  Objective:   Physical Exam BP 120/72   Pulse 73   Ht 5\' 5"  (  1.651 m)   Wt 233 lb (105.7 kg)   SpO2 95%   BMI 38.77 kg/m   30 minutes total time before during and after the in person visit

## 2020-06-09 ENCOUNTER — Other Ambulatory Visit: Payer: Self-pay | Admitting: Internal Medicine

## 2020-07-28 NOTE — Progress Notes (Signed)
Virtual Visit via Video Note  I connected with Kerry Powell on 07/28/20 at  3:20 PM EDT by a video enabled telemedicine application and verified that I am speaking with the correct person using two identifiers.   I discussed the limitations of evaluation and management by telemedicine and the availability of in person appointments. The patient expressed understanding and agreed to proceed.  Present for the visit:  Myself, Dr Billey Gosling, Greggory Brandy.  The patient is currently at home and I am in the office.    No referring provider.    History of Present Illness: She is here for an acute visit for cold symptoms.   Her symptoms started 1 week ago - getting worse.  No one else sick at home.  She is experiencing body aches - all muscles, significant fatigue, worsening of her chronic headaches and neck pain, postnasal drip, congestion, possible sinus pain.  It is difficult to tell sinus pain versus headache.  She has tried taking tylenol  Feet feel like they are covered in dried mud all the time.   bp 122/82   Hr 71   Sugar 141  Review of Systems  Constitutional: Positive for malaise/fatigue. Negative for chills and fever.  HENT: Positive for congestion and sinus pain. Negative for ear pain and sore throat.        Pnd, no change in taste/smell  Respiratory: Positive for shortness of breath (chronic). Negative for cough and wheezing.   Gastrointestinal: Negative for diarrhea and nausea.  Musculoskeletal: Positive for myalgias and neck pain.  Neurological: Positive for headaches.      Social History   Socioeconomic History  . Marital status: Married    Spouse name: Not on file  . Number of children: 2  . Years of education: Not on file  . Highest education level: Not on file  Occupational History  . Occupation: Retired  Tobacco Use  . Smoking status: Former Smoker    Packs/day: 1.00    Years: 46.00    Pack years: 46.00    Quit date: 06/30/2009    Years since quitting:  11.0  . Smokeless tobacco: Never Used  Vaping Use  . Vaping Use: Never used  Substance and Sexual Activity  . Alcohol use: No  . Drug use: No  . Sexual activity: Not on file  Other Topics Concern  . Not on file  Social History Narrative   Married, retired   1 son 1 daughter   2 caffeine/day   Social Determinants of Health   Financial Resource Strain: Low Risk   . Difficulty of Paying Living Expenses: Not hard at all  Food Insecurity: No Food Insecurity  . Worried About Charity fundraiser in the Last Year: Never true  . Ran Out of Food in the Last Year: Never true  Transportation Needs: No Transportation Needs  . Lack of Transportation (Medical): No  . Lack of Transportation (Non-Medical): No  Physical Activity: Inactive  . Days of Exercise per Week: 0 days  . Minutes of Exercise per Session: 0 min  Stress: No Stress Concern Present  . Feeling of Stress : Not at all  Social Connections: Moderately Isolated  . Frequency of Communication with Friends and Family: More than three times a week  . Frequency of Social Gatherings with Friends and Family: Once a week  . Attends Religious Services: Never  . Active Member of Clubs or Organizations: No  . Attends Archivist Meetings: Never  .  Marital Status: Married     Observations/Objective: Appears mildly ill and fatigued, in NAD Breathing normally Skin appears warm and dry   Assessment and Plan:  See Problem List for Assessment and Plan of chronic medical problems.   Follow Up Instructions:    I discussed the assessment and treatment plan with the patient. The patient was provided an opportunity to ask questions and all were answered. The patient agreed with the plan and demonstrated an understanding of the instructions.   The patient was advised to call back or seek an in-person evaluation if the symptoms worsen or if the condition fails to improve as anticipated.    Binnie Rail, MD

## 2020-07-29 ENCOUNTER — Telehealth (INDEPENDENT_AMBULATORY_CARE_PROVIDER_SITE_OTHER): Payer: Medicare Other | Admitting: Internal Medicine

## 2020-07-29 ENCOUNTER — Encounter: Payer: Self-pay | Admitting: Internal Medicine

## 2020-07-29 DIAGNOSIS — E559 Vitamin D deficiency, unspecified: Secondary | ICD-10-CM

## 2020-07-29 DIAGNOSIS — M791 Myalgia, unspecified site: Secondary | ICD-10-CM

## 2020-07-29 DIAGNOSIS — D649 Anemia, unspecified: Secondary | ICD-10-CM

## 2020-07-29 DIAGNOSIS — J019 Acute sinusitis, unspecified: Secondary | ICD-10-CM | POA: Diagnosis not present

## 2020-07-29 DIAGNOSIS — E782 Mixed hyperlipidemia: Secondary | ICD-10-CM

## 2020-07-29 DIAGNOSIS — E1122 Type 2 diabetes mellitus with diabetic chronic kidney disease: Secondary | ICD-10-CM

## 2020-07-29 DIAGNOSIS — R5383 Other fatigue: Secondary | ICD-10-CM

## 2020-07-29 DIAGNOSIS — R700 Elevated erythrocyte sedimentation rate: Secondary | ICD-10-CM

## 2020-07-29 DIAGNOSIS — F3289 Other specified depressive episodes: Secondary | ICD-10-CM

## 2020-07-29 DIAGNOSIS — N189 Chronic kidney disease, unspecified: Secondary | ICD-10-CM

## 2020-07-29 DIAGNOSIS — I10 Essential (primary) hypertension: Secondary | ICD-10-CM

## 2020-07-29 DIAGNOSIS — E1142 Type 2 diabetes mellitus with diabetic polyneuropathy: Secondary | ICD-10-CM

## 2020-07-29 MED ORDER — AMOXICILLIN-POT CLAVULANATE 875-125 MG PO TABS: 1.0000 | ORAL_TABLET | Freq: Two times a day (BID) | ORAL | 0 refills | Status: DC

## 2020-07-29 NOTE — Assessment & Plan Note (Signed)
CBC, iron panel Had colonoscopy and EGD by GI

## 2020-07-29 NOTE — Assessment & Plan Note (Signed)
Acute on chronic Has some chronic fatigue, but much worse now Acute sinus infection possibly contributing Concern for possible PMR or other autoimmune cause She also has anemia that could be contributing CBC, iron panel, ESR, CRP, CMP, TSH

## 2020-07-29 NOTE — Assessment & Plan Note (Signed)
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 

## 2020-07-29 NOTE — Assessment & Plan Note (Addendum)
Acute Some but not all of her symptoms are consistent with possible sinus infection Likely bacterial  Start Augmentin 875-125 mg BID x 10 day otc cold medications Rest, fluid Call if no improvement

## 2020-07-29 NOTE — Assessment & Plan Note (Signed)
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

## 2020-07-29 NOTE — Assessment & Plan Note (Signed)
Acute Having increased whole-body myalgia along with fatigue and worsening of her occipital headache Concerned because her ESR was very elevated in the fall-at that time I did refer her to rheumatology, but she was not able to go because of family issues and has not had this further evaluated Her symptoms have improved for a while, but seem to be having now full body body aches Concern for autoimmune causes Check CBC, CMP, ESR and CRP Follow-up with me in the office next week

## 2020-08-01 ENCOUNTER — Other Ambulatory Visit (INDEPENDENT_AMBULATORY_CARE_PROVIDER_SITE_OTHER): Payer: Medicare Other

## 2020-08-01 ENCOUNTER — Encounter: Payer: Self-pay | Admitting: Internal Medicine

## 2020-08-01 DIAGNOSIS — D649 Anemia, unspecified: Secondary | ICD-10-CM

## 2020-08-01 DIAGNOSIS — R350 Frequency of micturition: Secondary | ICD-10-CM

## 2020-08-01 DIAGNOSIS — R5383 Other fatigue: Secondary | ICD-10-CM

## 2020-08-01 DIAGNOSIS — N1831 Chronic kidney disease, stage 3a: Secondary | ICD-10-CM

## 2020-08-01 DIAGNOSIS — M791 Myalgia, unspecified site: Secondary | ICD-10-CM | POA: Diagnosis not present

## 2020-08-01 DIAGNOSIS — E559 Vitamin D deficiency, unspecified: Secondary | ICD-10-CM | POA: Diagnosis not present

## 2020-08-01 DIAGNOSIS — E1122 Type 2 diabetes mellitus with diabetic chronic kidney disease: Secondary | ICD-10-CM

## 2020-08-01 DIAGNOSIS — E1142 Type 2 diabetes mellitus with diabetic polyneuropathy: Secondary | ICD-10-CM | POA: Diagnosis not present

## 2020-08-01 DIAGNOSIS — I10 Essential (primary) hypertension: Secondary | ICD-10-CM

## 2020-08-01 DIAGNOSIS — E782 Mixed hyperlipidemia: Secondary | ICD-10-CM | POA: Diagnosis not present

## 2020-08-01 DIAGNOSIS — F3289 Other specified depressive episodes: Secondary | ICD-10-CM

## 2020-08-01 DIAGNOSIS — N189 Chronic kidney disease, unspecified: Secondary | ICD-10-CM

## 2020-08-01 DIAGNOSIS — R700 Elevated erythrocyte sedimentation rate: Secondary | ICD-10-CM

## 2020-08-01 LAB — URINALYSIS, ROUTINE W REFLEX MICROSCOPIC
Bilirubin Urine: NEGATIVE
Hgb urine dipstick: NEGATIVE
Ketones, ur: NEGATIVE
Nitrite: NEGATIVE
RBC / HPF: NONE SEEN (ref 0–?)
Specific Gravity, Urine: 1.01 (ref 1.000–1.030)
Total Protein, Urine: NEGATIVE
Urine Glucose: NEGATIVE
Urobilinogen, UA: 0.2 (ref 0.0–1.0)
pH: 6.5 (ref 5.0–8.0)

## 2020-08-01 LAB — HEMOGLOBIN A1C: Hgb A1c MFr Bld: 7.4 % — ABNORMAL HIGH (ref 4.6–6.5)

## 2020-08-01 LAB — CBC WITH DIFFERENTIAL/PLATELET
Basophils Absolute: 0 10*3/uL (ref 0.0–0.1)
Basophils Relative: 0.5 % (ref 0.0–3.0)
Eosinophils Absolute: 0.2 10*3/uL (ref 0.0–0.7)
Eosinophils Relative: 2.8 % (ref 0.0–5.0)
HCT: 42 % (ref 36.0–46.0)
Hemoglobin: 14.1 g/dL (ref 12.0–15.0)
Lymphocytes Relative: 18.9 % (ref 12.0–46.0)
Lymphs Abs: 1.5 10*3/uL (ref 0.7–4.0)
MCHC: 33.5 g/dL (ref 30.0–36.0)
MCV: 96 fl (ref 78.0–100.0)
Monocytes Absolute: 0.5 10*3/uL (ref 0.1–1.0)
Monocytes Relative: 6 % (ref 3.0–12.0)
Neutro Abs: 5.8 10*3/uL (ref 1.4–7.7)
Neutrophils Relative %: 71.8 % (ref 43.0–77.0)
Platelets: 275 10*3/uL (ref 150.0–400.0)
RBC: 4.38 Mil/uL (ref 3.87–5.11)
RDW: 14.3 % (ref 11.5–15.5)
WBC: 8.1 10*3/uL (ref 4.0–10.5)

## 2020-08-01 LAB — TSH: TSH: 3.48 u[IU]/mL (ref 0.35–4.50)

## 2020-08-01 LAB — SEDIMENTATION RATE: Sed Rate: 16 mm/hr (ref 0–30)

## 2020-08-01 LAB — VITAMIN B12: Vitamin B-12: 236 pg/mL (ref 211–911)

## 2020-08-01 LAB — VITAMIN D 25 HYDROXY (VIT D DEFICIENCY, FRACTURES): VITD: 29.89 ng/mL — ABNORMAL LOW (ref 30.00–100.00)

## 2020-08-02 LAB — LIPID PANEL
Cholesterol: 157 mg/dL (ref 0–200)
HDL: 37.4 mg/dL — ABNORMAL LOW (ref >39.00)
NonHDL: 120.09
Total CHOL/HDL Ratio: 4
Triglycerides: 250 mg/dL — ABNORMAL HIGH (ref 0.0–149.0)
VLDL: 50 mg/dL — ABNORMAL HIGH (ref 0.0–40.0)

## 2020-08-02 LAB — COMPREHENSIVE METABOLIC PANEL
ALT: 13 U/L (ref 0–35)
AST: 13 U/L (ref 0–37)
Albumin: 4.3 g/dL (ref 3.5–5.2)
Alkaline Phosphatase: 114 U/L (ref 39–117)
BUN: 21 mg/dL (ref 6–23)
CO2: 28 mEq/L (ref 19–32)
Calcium: 9.4 mg/dL (ref 8.4–10.5)
Chloride: 99 mEq/L (ref 96–112)
Creatinine, Ser: 1.5 mg/dL — ABNORMAL HIGH (ref 0.40–1.20)
GFR: 33.91 mL/min — ABNORMAL LOW (ref >60.00)
Glucose, Bld: 208 mg/dL — ABNORMAL HIGH (ref 70–99)
Potassium: 4.7 mEq/L (ref 3.5–5.1)
Sodium: 136 mEq/L (ref 135–145)
Total Bilirubin: 0.4 mg/dL (ref 0.2–1.2)
Total Protein: 6.8 g/dL (ref 6.0–8.3)

## 2020-08-02 LAB — IRON,TIBC AND FERRITIN PANEL
%SAT: 19 % (calc) (ref 16–45)
Ferritin: 23 ng/mL (ref 16–288)
Iron: 65 ug/dL (ref 45–160)
TIBC: 340 mcg/dL (calc) (ref 250–450)

## 2020-08-02 LAB — URINE CULTURE
MICRO NUMBER:: 11972669
Result:: NO GROWTH
SPECIMEN QUALITY:: ADEQUATE

## 2020-08-02 LAB — C-REACTIVE PROTEIN: CRP: <1 mg/dL (ref 0.5–20.0)

## 2020-08-02 LAB — LDL CHOLESTEROL, DIRECT: Direct LDL: 79 mg/dL

## 2020-08-02 NOTE — Patient Instructions (Addendum)
  You had a B12 injection today.   Medications changes include :   Increase vitamin d to 3000 units daily.  Start B12 1000 mcg daily.  Stop methocarbamol and try flexeril 5-10 mg at night.  Increase cymbalta to 90 mg daily.    Your prescription(s) have been submitted to your pharmacy. Please take as directed and contact our office if you believe you are having problem(s) with the medication(s).

## 2020-08-02 NOTE — Progress Notes (Signed)
Subjective:    Patient ID: Kerry Powell, female    DOB: 1945/05/27, 75 y.o.   MRN: [id number]  HPI The patient is here for an acute visit.   Fatigue - she is experiencing significant fatigue, myalgias all over, joint pain, worsening of chronic headaches and neck pain.  I saw her 6/3 and she also had a possible sinus infection - taking an antibiotic. She thinks her drainage is better, but she still feels bad.  She wonders if her neck and headaches just flared up and is causing all the other symptoms.  Because of her pain she is not sleeping well.       She takes methocarbamol every other night - not sure if it helps.    .   Medications and allergies reviewed with patient and updated if appropriate.  Patient Active Problem List   Diagnosis Date Noted  . Acute sinus infection 07/29/2020  . Diabetic polyneuropathy associated with type 2 diabetes mellitus ([place]) 07/29/2020  . Diastolic dysfunction without heart failure 01/20/2020  . ESR raised 01/20/2020  . Polyarthralgia 12/07/2019  . Myalgia 12/07/2019  . Anemia 12/07/2019  . Fatigue 12/07/2019  . CAD (coronary artery disease), native coronary artery   . Pleural plaque 04/27/2019  . Spasm of thoracic back muscle 11/14/2018  . SOB (shortness of breath) 04/17/2018  . Vitamin D deficiency 03/12/2018  . Thoracic aortic aneurysm ([place]) 02/06/2018  . Sleep difficulties 09/09/2017  . Depression 07/01/2017  . Dizziness 06/10/2017  . H/O renal cell carcinoma 05/23/2017  . Hyperlipidemia 06/25/2016  . Occipital neuralgia 05/09/2016  . Anxiety 07/26/2015  . Chronic neck pain 04/30/2015  . Diabetes ([place]) 04/27/2015  . History of CVA (cerebrovascular accident) 04/26/2015  . History of gout 04/26/2015  . CKD (chronic kidney disease) 04/26/2015  . Essential hypertension, benign 04/26/2015  . Snoring 04/26/2015  . Bilateral leg edema 04/26/2015  . Dysphagia 04/26/2015  . GERD (gastroesophageal reflux disease) 04/26/2015  .  Personal history of colonic adenoma 05/29/2007  . HIATAL HERNIA 04/07/2007  . RESTLESS LEG SYNDROME 04/04/2007  . 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 COPD (chronic obstructive pulmonary disease) ([place]) 04/04/2007  . IRRITABLE BOWEL SYNDROME 04/04/2007  . OSTEOARTHRITIS 04/04/2007    Current Outpatient Medications on File Prior to Visit  Medication Sig Dispense Refill  . ACETAMINOPHEN-BUTALBITAL 50-325 MG TABS TAKE 1 TABLET BY MOUTH EVERY DAY AS NEEDED 120 tablet 3  . allopurinol (ZYLOPRIM) 300 MG tablet TAKE 1 TABLET BY MOUTH EVERY DAY 90 tablet 1  . amLODipine (NORVASC) 5 MG tablet Take 1.5 tablets (7.5 mg total) by mouth daily. 135 tablet 1  . amoxicillin-clavulanate (AUGMENTIN) 875-125 MG tablet Take 1 tablet by mouth 2 (two) times daily. 20 tablet 0  . butalbital-acetaminophen-caffeine (FIORICET) 50-325-40 MG tablet butalbital-acetaminophen-caffeine 50 mg-325 mg-40 mg tablet  TAKE 1 TABLET BY MOUTH ONCE DAILY AS NEEDED    . cetirizine (ZYRTEC ALLERGY) 10 MG tablet Take 1 tablet (10 mg total) by mouth daily. 30 tablet 11  . cholecalciferol (VITAMIN D3) 25 MCG (1000 UNIT) tablet Take 2,000 Units by mouth daily.    . clopidogrel (PLAVIX) 75 MG tablet TAKE 1 TABLET BY MOUTH EVERY DAY 90 tablet 1  . DULoxetine (CYMBALTA) 60 MG capsule TAKE 1 CAPSULE BY MOUTH EVERY DAY 90 capsule 1  . ferrous sulfate 325 (65 FE) MG tablet Take 325 mg by mouth daily with breakfast.    . fluticasone (FLONASE) 50 MCG/ACT nasal spray Place 2 sprays into both nostrils daily.  11.1 mL 11  . furosemide (LASIX) 20 MG tablet Take 20 mg by mouth. 3 times a week as needed for swelling    . ipratropium-albuterol (DUONEB) 0.5-2.5 (3) MG/3ML SOLN Take 3 mLs by nebulization 4 (four) times daily. 360 mL 5  . metFORMIN (GLUCOPHAGE) 500 MG tablet Take 1 tablet (500 mg total) by mouth 2 (two) times daily with a meal. 180 tablet 3  . metoprolol succinate (TOPROL-XL) 50 MG 24 hr tablet Take 50 mg by mouth daily.    .  montelukast (SINGULAIR) 10 MG tablet TAKE 1 TABLET BY MOUTH EVERY DAY AT BEDTIME 30 tablet 0  . pantoprazole (PROTONIX) 40 MG tablet Take 1 tablet (40 mg total) by mouth 2 (two) times daily before a meal. 180 tablet 1  . Phenylephrine-DM-GG (MUCINEX FAST-MAX CONGEST COUGH PO) Take by mouth as needed.    . rosuvastatin (CRESTOR) 40 MG tablet Take 1 tablet (40 mg total) by mouth daily. 90 tablet 3  . telmisartan (MICARDIS) 40 MG tablet Take 40 mg by mouth daily.    [name] umeclidinium-vilanterol (ANORO ELLIPTA) 62.5-25 MCG/INH AEPB Inhale 1 puff into the lungs daily. 60 each 5   No current facility-administered medications on file prior to visit.    Past Medical History:  Diagnosis Date  . Allergy   . Anemia   . Anxiety   . Arthritis   . BACK PAIN, CHRONIC 04/04/2007   Qualifier: Diagnosis of  By: Carlean Purl MD, Dimas Millin BIPOLAR AFFECTIVE DISORDER 04/04/2007   Qualifier: History of  By: Carlean Purl MD, Dimas Millin   . CAD (coronary artery disease), native coronary artery    mild CAD with no obstructive disease by coronary CTA 06/2019  . Cataract    removed both eyes  . Chronic neck pain 04/30/2015   Cervical spondylosis [facility] Intermittent numbness and tingling in occipital region and headaches S/p 2 occipital nerve blocks - only temp relief no relief with gabapentin, ultram, brace MRI neck - facet arthrosis   . CKD (chronic kidney disease) 04/26/2015  . COPD (chronic obstructive pulmonary disease) ([place])    pt denies - told this while was smoking -  . Cough 04/26/2015  . Depression 07/01/2017  . Diabetes ([place]) 04/27/2015   Diagnosed 03/2015   . Dizziness 06/10/2017  . Dysphagia 04/26/2015  . DYSPNEA 12/24/2007   Qualifier: Diagnosis of  By: Glennis Brink    . Elevated TSH 08/09/2015  . GASTRIC ULCER, ACUTE 04/07/2007   Annotation: EGD 04/07/07 Qualifier: Diagnosis of  By: Carlean Purl MD, FACG, [place] 04/07/2007   Annotation: EGD 04/07/07 Qualifier: Diagnosis of  By:  Carlean Purl MD, Dimas Millin   . GERD (gastroesophageal reflux disease)   . H/O renal cell carcinoma 05/23/2017   Dx 2010, s/p cryoablation Follows with urology - Dr Lovena Neighbours  . HIATAL HERNIA 04/07/2007   Annotation: EGD 04/07/07 Qualifier: Diagnosis of  By: Carlean Purl MD, Dimas Millin History of CVA (cerebrovascular accident) 04/26/2015  . History of gout 04/26/2015  . Hyperlipidemia   . Hypertension   . Irritable bowel syndrome 04/04/2007   Qualifier: Diagnosis of  By: Carlean Purl MD, Dimas Millin Kidney tumor (benign)   . Nausea and vomiting 09/09/2017  . 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 OSTEOARTHRITIS 04/04/2007   Qualifier: Diagnosis of  By: Carlean Purl MD, Dimas Millin Personal  history of colonic adenoma 06/07/2012  . POSTNASAL DRIP SYNDROME 12/29/2007   Qualifier: Diagnosis of  By: Chase Caller MD, [name]    . RESTLESS LEG SYNDROME 04/04/2007   Qualifier: Diagnosis of  By: Carlean Purl MD, FACG, [place], 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   Qualifier: Diagnosis of  By: Carlean Purl MD, Dimas Millin Sleep difficulties 09/09/2017  . Snoring 04/26/2015  . Stroke [facility])    x2 2011  . Thoracic aortic aneurysm ([place]) 02/06/2018   Seen on MRI 01/25/18 of thoracic spine ordered by [facility] -- referred to CTS to monitor  -- 3.5 cm  . TIA (transient ischemic attack)   . TOBACCO ABUSE 12/17/2007   Qualifier: Diagnosis of  By: Chase Caller MD, [name]    . Trigeminal neuralgia of right side of face 02/08/2016  . URI (upper respiratory infection) 02/08/2016  . Vitamin D deficiency 03/12/2018    Past Surgical History:  Procedure Laterality Date  . ABDOMINAL HYSTERECTOMY    . APPENDECTOMY    . CATARACT EXTRACTION    . COLONOSCOPY    . CYSTOCELE REPAIR    . ESOPHAGOGASTRODUODENOSCOPY    . KIDNEY SURGERY     benign tumor removal  . ORTHOPEDIC SURGERY     Wrist & elbow  . POLYPECTOMY    . RECTOCELE REPAIR    . TONSILECTOMY, ADENOIDECTOMY, BILATERAL  MYRINGOTOMY AND TUBES Bilateral 1968  . UPPER GASTROINTESTINAL ENDOSCOPY      Social History   Socioeconomic History  . Marital status: Married    Spouse name: Not on file  . Number of children: 2  . Years of education: Not on file  . Highest education level: Not on file  Occupational History  . Occupation: Retired  Tobacco Use  . Smoking status: Former Smoker    Packs/day: 1.00    Years: 46.00    Pack years: 46.00    Quit date: 06/30/2009    Years since quitting: 11.1  . Smokeless tobacco: Never Used  Vaping Use  . Vaping Use: Never used  Substance and Sexual Activity  . Alcohol use: No  . Drug use: No  . Sexual activity: Not on file  Other Topics Concern  . Not on file  Social History Narrative   Married, retired   1 son 1 daughter   2 caffeine/day   Social Determinants of Health   Financial Resource Strain: Low Risk   . Difficulty of Paying Living Expenses: Not hard at all  Food Insecurity: No Food Insecurity  . Worried About Charity fundraiser in the Last Year: Never true  . Ran Out of Food in the Last Year: Never true  Transportation Needs: No Transportation Needs  . Lack of Transportation (Medical): No  . Lack of Transportation (Non-Medical): No  Physical Activity: Inactive  . Days of Exercise per Week: 0 days  . Minutes of Exercise per Session: 0 min  Stress: No Stress Concern Present  . Feeling of Stress : Not at all  Social Connections: Moderately Isolated  . Frequency of Communication with Friends and Family: More than three times a week  . Frequency of Social Gatherings with Friends and Family: Once a week  . Attends Religious Services: Never  . Active Member of Clubs or Organizations: No  . Attends Archivist Meetings: Never  . Marital Status: Married    Family History  Problem Relation Age of Onset  . Congestive Heart Failure Mother   . Cancer Mother  ovarian  . Heart disease Father   . Diabetes Father   . Kidney disease  Father   . Diabetes Sister   . Kidney disease Sister   . Colon cancer Neg Hx   . Colon polyps Neg Hx   . Esophageal cancer Neg Hx   . Rectal cancer Neg Hx   . Stomach cancer Neg Hx     Review of Systems  Constitutional: Negative for fever.  HENT: Positive for postnasal drip.   Respiratory: Positive for shortness of breath.   Cardiovascular: Positive for leg swelling. Negative for chest pain and palpitations.  Musculoskeletal: Positive for arthralgias, myalgias and neck pain.  Neurological: Positive for numbness (feet - feel caked with dry mug) and headaches.  Psychiatric/Behavioral: Positive for dysphoric mood.       Objective:   Vitals:   08/03/20 1351  BP: 120/80  Pulse: 75  Temp: 98 F (36.7 C)  SpO2: 94%   BP Readings from Last 3 Encounters:  08/03/20 120/80  06/02/20 120/72  05/31/20 124/78   Wt Readings from Last 3 Encounters:  08/03/20 232 lb (105.2 kg)  06/02/20 233 lb (105.7 kg)  05/31/20 232 lb 12.8 oz (105.6 kg)   Body mass index is 38.61 kg/m.   Physical Exam    Constitutional: Appears well-developed and well-nourished. No distress.  Head: Normocephalic and atraumatic.  Neck: Neck supple. No tracheal deviation present. No thyromegaly present.  No cervical lymphadenopathy Cardiovascular: Normal rate, regular rhythm and normal heart sounds.  No murmur heard. No carotid bruit .  No edema Pulmonary/Chest: Effort normal and breath sounds normal. No respiratory distress. No has no wheezes. No rales.  Skin: Skin is warm and dry. Not diaphoretic.  Psychiatric: Normal mood and affect. Behavior is normal.       Assessment & Plan:    See Problem List for Assessment and Plan of chronic medical problems.    This visit occurred during the SARS-CoV-2 public health emergency.  Safety protocols were in place, including screening questions prior to the visit, additional usage of staff PPE, and extensive cleaning of exam room while observing appropriate  contact time as indicated for disinfecting solutions.

## 2020-08-03 ENCOUNTER — Encounter: Payer: Self-pay | Admitting: Internal Medicine

## 2020-08-03 ENCOUNTER — Other Ambulatory Visit: Payer: Self-pay

## 2020-08-03 ENCOUNTER — Ambulatory Visit: Payer: Medicare Other | Admitting: Internal Medicine

## 2020-08-03 VITALS — BP 120/80 | HR 75 | Temp 98.0°F | Ht 65.0 in | Wt 232.0 lb

## 2020-08-03 DIAGNOSIS — N1831 Chronic kidney disease, stage 3a: Secondary | ICD-10-CM

## 2020-08-03 DIAGNOSIS — E559 Vitamin D deficiency, unspecified: Secondary | ICD-10-CM

## 2020-08-03 DIAGNOSIS — E538 Deficiency of other specified B group vitamins: Secondary | ICD-10-CM | POA: Insufficient documentation

## 2020-08-03 DIAGNOSIS — R5383 Other fatigue: Secondary | ICD-10-CM

## 2020-08-03 DIAGNOSIS — M5481 Occipital neuralgia: Secondary | ICD-10-CM

## 2020-08-03 DIAGNOSIS — I10 Essential (primary) hypertension: Secondary | ICD-10-CM

## 2020-08-03 DIAGNOSIS — E1122 Type 2 diabetes mellitus with diabetic chronic kidney disease: Secondary | ICD-10-CM

## 2020-08-03 DIAGNOSIS — F3289 Other specified depressive episodes: Secondary | ICD-10-CM | POA: Diagnosis not present

## 2020-08-03 DIAGNOSIS — M791 Myalgia, unspecified site: Secondary | ICD-10-CM

## 2020-08-03 MED ORDER — DULOXETINE HCL 30 MG PO CPEP: 30.0000 mg | ORAL_CAPSULE | Freq: Every day | ORAL | 1 refills | Status: DC

## 2020-08-03 MED ORDER — CYANOCOBALAMIN 1000 MCG/ML IJ SOLN
1000.0000 ug | Freq: Once | INTRAMUSCULAR | Status: AC
  Administered 2020-08-03: 15:00:00 1000 ug via INTRAMUSCULAR

## 2020-08-03 MED ORDER — CYCLOBENZAPRINE HCL 5 MG PO TABS: 5.0000 mg | ORAL_TABLET | Freq: Three times a day (TID) | ORAL | 2 refills | Status: DC | PRN

## 2020-08-03 NOTE — Assessment & Plan Note (Addendum)
Chronic Pain has flared recently Does not tolerate gabapentin Methocarbamol not helping Trail of flexeril 5-10 mg at night - hopefully this will help with sleep and pain Discussed possible neurology referral - she has seen several specialist in the past

## 2020-08-03 NOTE — Assessment & Plan Note (Signed)
Chronic Vitamin D level is slightly low Increase vitamin d intake to 3000 units daily

## 2020-08-03 NOTE — Assessment & Plan Note (Signed)
Subacute No obvious cause on blood work - likely related to chronic pain, not sleeping and possibly depression Start flexeril 5-10 mg HS, increase cymbalta to 90 mg daily Will re-evaluate in two months

## 2020-08-03 NOTE — Assessment & Plan Note (Addendum)
New  Lab Results  Component Value Date   VITAMINB12 236 08/01/2020   B12 injection today - this may help with her energy and nerve symptoms Start B12 orally 1000 mcg daily If convenient we can do additional B12 injections to help get her level back up to normal

## 2020-08-03 NOTE — Assessment & Plan Note (Signed)
Chronic Lab Results  Component Value Date   HGBA1C 7.4 (H) 08/01/2020   Not ideally controlled Stressed better compliance with a diabetic diet Will hold off on medication changes for now - recheck in August

## 2020-08-03 NOTE — Assessment & Plan Note (Signed)
Subacute Esr, crp normal Likely related to other pain, not sleeping, OA, being sedentary Flexeril 5-10 mg HS

## 2020-08-03 NOTE — Assessment & Plan Note (Signed)
Chronic BP well controlled Continue amlodipine 7.5 mg qd, lasix 20 mg qd, metoprolol xl 50 mg daily, telmisartan 40 mg daily cmp reviewed

## 2020-08-03 NOTE — Assessment & Plan Note (Signed)
Chronic Not ideally controlled - likely related to her medical problems and not feeling well Increase cymbalta to 90 mg daily

## 2020-08-05 ENCOUNTER — Telehealth: Payer: Self-pay

## 2020-08-05 ENCOUNTER — Other Ambulatory Visit: Payer: Self-pay | Admitting: Internal Medicine

## 2020-08-05 DIAGNOSIS — J948 Other specified pleural conditions: Secondary | ICD-10-CM

## 2020-08-05 NOTE — Telephone Encounter (Signed)
PA Denied. We denied this request under Medicare Part D because: The information provided by your prescriber did not  meet the requirements for covering this medication (prior authorization).  Your plan does not allow coverage of this medication based on your prescriber answering No to the following  question(s):  The [facility] identifies the use of this medication as potentially inappropriate in older  adults, meaning it is best avoided, prescribed at reduced dosage, or used with caution or carefully monitored.  Does the benefit of therapy with this prescribed medication outweigh the potential risks for this patient?  Appeal started due to error of RN.   Benefit of therapy DOES outweigh the risks.

## 2020-08-05 NOTE — Telephone Encounter (Signed)
PA started with Cover My Meds Key: [id number]

## 2020-08-06 ENCOUNTER — Other Ambulatory Visit: Payer: Self-pay | Admitting: Internal Medicine

## 2020-08-08 ENCOUNTER — Encounter: Payer: Self-pay | Admitting: Internal Medicine

## 2020-08-08 NOTE — Telephone Encounter (Signed)
   Please call insurance to answer additional questions Call [phone] Use ref # [id number]

## 2020-08-09 NOTE — Telephone Encounter (Signed)
Printed pt lab results to and fax results to patient's kidney provider.

## 2020-08-09 NOTE — Telephone Encounter (Signed)
Approved today

## 2020-08-09 NOTE — Telephone Encounter (Signed)
Per Cover My Meds: "Denied on June 13 Your request has been denied Your appeal has been denied." Representative states there were multiple attempts made to contact practice, however, these attempts were made after hrs.  Transferred to pharmacist; additional questions answered.  Appeal will be reopened; outcome may take up to 72hrs.  Determination will be faxed.

## 2020-08-10 NOTE — Telephone Encounter (Addendum)
PA approved 05/11/20-11/07/20.   Left detailed message with prescription info & approval.

## 2020-08-30 ENCOUNTER — Encounter: Payer: Self-pay | Admitting: Internal Medicine

## 2020-09-12 ENCOUNTER — Other Ambulatory Visit: Payer: Self-pay | Admitting: Internal Medicine

## 2020-09-13 ENCOUNTER — Telehealth: Payer: Self-pay | Admitting: Cardiology

## 2020-09-13 ENCOUNTER — Other Ambulatory Visit: Payer: Self-pay | Admitting: Cardiology

## 2020-09-13 MED ORDER — ROSUVASTATIN CALCIUM 40 MG PO TABS: 40.0000 mg | ORAL_TABLET | Freq: Every day | ORAL | 3 refills | Status: DC

## 2020-09-13 NOTE — Telephone Encounter (Signed)
Refill sent in for Rosuvastatin to [facility], per pt request.

## 2020-09-13 NOTE — Telephone Encounter (Signed)
*  STAT* If patient is at the pharmacy, call can be transferred to refill team.   1. Which medications need to be refilled? (please list name of each medication and dose if known)  rosuvastatin (CRESTOR) 40 MG tablet  2. Which pharmacy/location (including street and city if local pharmacy) is medication to be sent to? [place] ([place] [place] - [address]  3. Do they need a 30 day or 90 day supply?  90 day supply

## 2020-09-23 ENCOUNTER — Encounter: Payer: Self-pay | Admitting: Internal Medicine

## 2020-09-25 ENCOUNTER — Other Ambulatory Visit: Payer: Self-pay

## 2020-09-25 ENCOUNTER — Emergency Department (HOSPITAL_COMMUNITY): Payer: Medicare Other

## 2020-09-25 ENCOUNTER — Inpatient Hospital Stay (HOSPITAL_COMMUNITY)
Admission: EM | Admit: 2020-09-25 | Discharge: 2020-09-30 | DRG: 177 | Disposition: A | Discharge status: Home / Self Care | Payer: Medicare Other | Attending: Family Medicine | Admitting: Family Medicine

## 2020-09-25 ENCOUNTER — Encounter (HOSPITAL_COMMUNITY): Payer: Self-pay | Admitting: Emergency Medicine

## 2020-09-25 DIAGNOSIS — Z90710 Acquired absence of both cervix and uterus: Secondary | ICD-10-CM

## 2020-09-25 DIAGNOSIS — Z841 Family history of disorders of kidney and ureter: Secondary | ICD-10-CM

## 2020-09-25 DIAGNOSIS — M549 Dorsalgia, unspecified: Secondary | ICD-10-CM | POA: Diagnosis present

## 2020-09-25 DIAGNOSIS — Z7984 Long term (current) use of oral hypoglycemic drugs: Secondary | ICD-10-CM

## 2020-09-25 DIAGNOSIS — N1832 Chronic kidney disease, stage 3b: Secondary | ICD-10-CM | POA: Diagnosis present

## 2020-09-25 DIAGNOSIS — Z87891 Personal history of nicotine dependence: Secondary | ICD-10-CM

## 2020-09-25 DIAGNOSIS — J449 Chronic obstructive pulmonary disease, unspecified: Secondary | ICD-10-CM | POA: Diagnosis present

## 2020-09-25 DIAGNOSIS — G8929 Other chronic pain: Secondary | ICD-10-CM | POA: Diagnosis present

## 2020-09-25 DIAGNOSIS — E1159 Type 2 diabetes mellitus with other circulatory complications: Secondary | ICD-10-CM | POA: Diagnosis present

## 2020-09-25 DIAGNOSIS — Z888 Allergy status to other drugs, medicaments and biological substances status: Secondary | ICD-10-CM

## 2020-09-25 DIAGNOSIS — G2581 Restless legs syndrome: Secondary | ICD-10-CM | POA: Diagnosis present

## 2020-09-25 DIAGNOSIS — J439 Emphysema, unspecified: Secondary | ICD-10-CM | POA: Diagnosis present

## 2020-09-25 DIAGNOSIS — U071 COVID-19: Principal | ICD-10-CM | POA: Diagnosis present

## 2020-09-25 DIAGNOSIS — I152 Hypertension secondary to endocrine disorders: Secondary | ICD-10-CM | POA: Diagnosis present

## 2020-09-25 DIAGNOSIS — R0902 Hypoxemia: Secondary | ICD-10-CM

## 2020-09-25 DIAGNOSIS — N179 Acute kidney failure, unspecified: Secondary | ICD-10-CM

## 2020-09-25 DIAGNOSIS — Z8249 Family history of ischemic heart disease and other diseases of the circulatory system: Secondary | ICD-10-CM

## 2020-09-25 DIAGNOSIS — I10 Essential (primary) hypertension: Secondary | ICD-10-CM | POA: Diagnosis present

## 2020-09-25 DIAGNOSIS — E1122 Type 2 diabetes mellitus with diabetic chronic kidney disease: Secondary | ICD-10-CM | POA: Diagnosis present

## 2020-09-25 DIAGNOSIS — Z8673 Personal history of transient ischemic attack (TIA), and cerebral infarction without residual deficits: Secondary | ICD-10-CM

## 2020-09-25 DIAGNOSIS — J302 Other seasonal allergic rhinitis: Secondary | ICD-10-CM | POA: Diagnosis present

## 2020-09-25 DIAGNOSIS — J9601 Acute respiratory failure with hypoxia: Secondary | ICD-10-CM | POA: Diagnosis present

## 2020-09-25 DIAGNOSIS — E1169 Type 2 diabetes mellitus with other specified complication: Secondary | ICD-10-CM | POA: Diagnosis present

## 2020-09-25 DIAGNOSIS — Z881 Allergy status to other antibiotic agents status: Secondary | ICD-10-CM

## 2020-09-25 DIAGNOSIS — Z7902 Long term (current) use of antithrombotics/antiplatelets: Secondary | ICD-10-CM

## 2020-09-25 DIAGNOSIS — Z79899 Other long term (current) drug therapy: Secondary | ICD-10-CM

## 2020-09-25 DIAGNOSIS — I129 Hypertensive chronic kidney disease with stage 1 through stage 4 chronic kidney disease, or unspecified chronic kidney disease: Secondary | ICD-10-CM | POA: Diagnosis present

## 2020-09-25 DIAGNOSIS — I2510 Atherosclerotic heart disease of native coronary artery without angina pectoris: Secondary | ICD-10-CM | POA: Diagnosis present

## 2020-09-25 DIAGNOSIS — J1282 Pneumonia due to coronavirus disease 2019: Secondary | ICD-10-CM | POA: Diagnosis present

## 2020-09-25 DIAGNOSIS — E785 Hyperlipidemia, unspecified: Secondary | ICD-10-CM | POA: Diagnosis present

## 2020-09-25 DIAGNOSIS — K589 Irritable bowel syndrome without diarrhea: Secondary | ICD-10-CM | POA: Diagnosis present

## 2020-09-25 DIAGNOSIS — F32A Depression, unspecified: Secondary | ICD-10-CM | POA: Diagnosis present

## 2020-09-25 DIAGNOSIS — Z882 Allergy status to sulfonamides status: Secondary | ICD-10-CM

## 2020-09-25 DIAGNOSIS — Z9049 Acquired absence of other specified parts of digestive tract: Secondary | ICD-10-CM

## 2020-09-25 DIAGNOSIS — K219 Gastro-esophageal reflux disease without esophagitis: Secondary | ICD-10-CM | POA: Diagnosis present

## 2020-09-25 DIAGNOSIS — Z833 Family history of diabetes mellitus: Secondary | ICD-10-CM

## 2020-09-25 LAB — COMPREHENSIVE METABOLIC PANEL
ALT: 17 U/L (ref 0–44)
AST: 22 U/L (ref 15–41)
Albumin: 3.8 g/dL (ref 3.5–5.0)
Alkaline Phosphatase: 82 U/L (ref 38–126)
Anion gap: 8 (ref 5–15)
BUN: 19 mg/dL (ref 8–23)
CO2: 26 mmol/L (ref 22–32)
Calcium: 8.4 mg/dL — ABNORMAL LOW (ref 8.9–10.3)
Chloride: 99 mmol/L (ref 98–111)
Creatinine, Ser: 1.75 mg/dL — ABNORMAL HIGH (ref 0.44–1.00)
GFR, Estimated: 30 mL/min — ABNORMAL LOW (ref >60)
Glucose, Bld: 136 mg/dL — ABNORMAL HIGH (ref 70–99)
Potassium: 4.6 mmol/L (ref 3.5–5.1)
Sodium: 133 mmol/L — ABNORMAL LOW (ref 135–145)
Total Bilirubin: 0.6 mg/dL (ref 0.3–1.2)
Total Protein: 7.3 g/dL (ref 6.5–8.1)

## 2020-09-25 LAB — URINALYSIS, ROUTINE W REFLEX MICROSCOPIC
Bilirubin Urine: NEGATIVE
Glucose, UA: NEGATIVE mg/dL
Hgb urine dipstick: NEGATIVE
Ketones, ur: NEGATIVE mg/dL
Leukocytes,Ua: NEGATIVE
Nitrite: NEGATIVE
Protein, ur: NEGATIVE mg/dL
Specific Gravity, Urine: 1.017 (ref 1.005–1.030)
pH: 6 (ref 5.0–8.0)

## 2020-09-25 LAB — CBC WITH DIFFERENTIAL/PLATELET
Abs Immature Granulocytes: 0.01 10*3/uL (ref 0.00–0.07)
Basophils Absolute: 0 10*3/uL (ref 0.0–0.1)
Basophils Relative: 0 %
Eosinophils Absolute: 0 10*3/uL (ref 0.0–0.5)
Eosinophils Relative: 0 %
HCT: 45.4 % (ref 36.0–46.0)
Hemoglobin: 14.7 g/dL (ref 12.0–15.0)
Immature Granulocytes: 0 %
Lymphocytes Relative: 24 %
Lymphs Abs: 1 10*3/uL (ref 0.7–4.0)
MCH: 31.3 pg (ref 26.0–34.0)
MCHC: 32.4 g/dL (ref 30.0–36.0)
MCV: 96.8 fL (ref 80.0–100.0)
Monocytes Absolute: 0.6 10*3/uL (ref 0.1–1.0)
Monocytes Relative: 14 %
Neutro Abs: 2.6 10*3/uL (ref 1.7–7.7)
Neutrophils Relative %: 62 %
Platelets: 235 10*3/uL (ref 150–400)
RBC: 4.69 MIL/uL (ref 3.87–5.11)
RDW: 13.4 % (ref 11.5–15.5)
WBC: 4.2 10*3/uL (ref 4.0–10.5)
nRBC: 0 % (ref 0.0–0.2)

## 2020-09-25 MED ORDER — SODIUM CHLORIDE 0.9 % IV BOLUS
500.0000 mL | Freq: Once | INTRAVENOUS | Status: AC
  Administered 2020-09-25: 500 mL via INTRAVENOUS

## 2020-09-25 NOTE — ED Notes (Signed)
Patient is currently on 3L [place]. Patient is alert and oriented X4. Purewick device in use for urine. Straight cath performed for clean sample of urine.

## 2020-09-25 NOTE — ED Provider Notes (Signed)
[facility] DEPT Provider Note   CSN: [id number] Arrival date & time: 09/25/20  2103     History Chief Complaint  Patient presents with   Weakness    Kerry Powell is a 75 y.o. female.  The history is provided by the patient and medical records.  Weakness  75 year old female with history of seasonal allergies, anemia, chronic back pain, coronary artery disease, chronic kidney disease, COPD, depression, gastritis, prior stroke, presenting to the ED for generalized weakness.  Patient reports she has felt awful for about 48 hours now.  Her husband recently tested for covid and was admitted to [facility].  States he is home now and since then she has been coughing, feeling feverish, generalized body aches, and dysuria.  States her appetite has been very poor, has been eating toast and other bland foods.  She is trying to drink fluids but knows she is not drinking enough.  States she has been having some loose stools but denies any nausea or vomiting.  States she feels weak all over.  She did call her doctor to try to arrange COVID test, however was not able to get this done yet.  She was vaccinated for COVID but without booster.  Patient's O2 sats down to 86% on room air with EMS.  She is not generally oxygen dependent.  Currently on 3 L and maintaining in the upper 90s.  Past Medical History:  Diagnosis Date   Allergy    Anemia    Anxiety    Arthritis    BACK PAIN, CHRONIC 04/04/2007   Qualifier: Diagnosis of  By: Carlean Purl MD, FACG, [place] DISORDER 04/04/2007   Qualifier: History of  By: Carlean Purl MD, Tonna Boehringer E    CAD (coronary artery disease), native coronary artery    mild CAD with no obstructive disease by coronary CTA 06/2019   Cataract    removed both eyes   Chronic neck pain 04/30/2015   Cervical spondylosis [facility] Intermittent numbness and tingling in occipital region and headaches S/p 2 occipital nerve blocks - only temp  relief no relief with gabapentin, ultram, brace MRI neck - facet arthrosis    CKD (chronic kidney disease) 04/26/2015   COPD (chronic obstructive pulmonary disease) ([place])    pt denies - told this while was smoking -   Cough 04/26/2015   Depression 07/01/2017   Diabetes ([place]) 04/27/2015   Diagnosed 03/2015    Dizziness 06/10/2017   Dysphagia 04/26/2015   DYSPNEA 12/24/2007   Qualifier: Diagnosis of  By: Glennis Brink     Elevated TSH 08/09/2015   GASTRIC ULCER, ACUTE 04/07/2007   Annotation: EGD 04/07/07 Qualifier: Diagnosis of  By: Carlean Purl MD, Tonna Boehringer E    GASTRITIS 04/07/2007   Annotation: EGD 04/07/07 Qualifier: Diagnosis of  By: Carlean Purl MD, Tonna Boehringer E    GERD (gastroesophageal reflux disease)    H/O renal cell carcinoma 05/23/2017   Dx 2010, s/p cryoablation Follows with urology - Dr Lovena Neighbours   HIATAL HERNIA 04/07/2007   Annotation: EGD 04/07/07 Qualifier: Diagnosis of  By: Carlean Purl MD, Tonna Boehringer E    History of CVA (cerebrovascular accident) 04/26/2015   History of gout 04/26/2015   Hyperlipidemia    Hypertension    Irritable bowel syndrome 04/04/2007   Qualifier: Diagnosis of  By: Carlean Purl MD, Dimas Millin    Kidney tumor (benign)    Nausea and vomiting 09/09/2017   Occipital neuralgia 05/09/2016  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 Dr Dimas Millin in [place] 04/04/2007   Qualifier: Diagnosis of  By: Carlean Purl MD, Dimas Millin    Personal history of colonic adenoma 06/07/2012   POSTNASAL DRIP SYNDROME 12/29/2007   Qualifier: Diagnosis of  By: Chase Caller MD, [name]     RESTLESS LEG SYNDROME 04/04/2007   Qualifier: Diagnosis of  By: Carlean Purl MD, FACG, Carl E    RHINOSINUSITIS, 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   Qualifier: Diagnosis of  By: Carlean Purl MD, Dimas Millin    Sleep difficulties 09/09/2017   Snoring 04/26/2015   Stroke ([place])    x2 2011   Thoracic aortic aneurysm ([place]) 02/06/2018   Seen on MRI 01/25/18 of thoracic spine ordered by [facility] -- referred to CTS to monitor  -- 3.5 cm   TIA  (transient ischemic attack)    TOBACCO ABUSE 12/17/2007   Qualifier: Diagnosis of  By: Chase Caller MD, [name]     Trigeminal neuralgia of right side of face 02/08/2016   URI (upper respiratory infection) 02/08/2016   Vitamin D deficiency 03/12/2018    Patient Active Problem List   Diagnosis Date Noted   B12 deficiency 08/03/2020   Acute sinus infection 07/29/2020   Diabetic polyneuropathy associated with type 2 diabetes mellitus ([place]) 38/11/1749   Diastolic dysfunction without heart failure 01/20/2020   ESR raised 01/20/2020   Polyarthralgia 12/07/2019   Myalgia 12/07/2019   Anemia 12/07/2019   Fatigue 12/07/2019   CAD (coronary artery disease), native coronary artery    Pleural plaque 04/27/2019   Spasm of thoracic back muscle 11/14/2018   SOB (shortness of breath) 04/17/2018   Vitamin D deficiency 03/12/2018   Thoracic aortic aneurysm ([place]) 02/06/2018   Sleep difficulties 09/09/2017   Depression 07/01/2017   Dizziness 06/10/2017   H/O renal cell carcinoma 05/23/2017   Hyperlipidemia 06/25/2016   Occipital neuralgia 05/09/2016   Anxiety 07/26/2015   Chronic neck pain 04/30/2015   Diabetes ([place]) 04/27/2015   History of CVA (cerebrovascular accident) 04/26/2015   History of gout 04/26/2015   CKD (chronic kidney disease) 04/26/2015   Essential hypertension, benign 04/26/2015   Snoring 04/26/2015   Bilateral leg edema 04/26/2015   Dysphagia 04/26/2015   GERD (gastroesophageal reflux disease) 04/26/2015   Personal history of colonic adenoma 05/29/2007   HIATAL HERNIA 04/07/2007   RESTLESS LEG SYNDROME 04/04/2007   RHINOSINUSITIS, 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   COPD (chronic obstructive pulmonary disease) ([place]) 04/04/2007   IRRITABLE BOWEL SYNDROME 04/04/2007   OSTEOARTHRITIS 04/04/2007    Past Surgical History:  Procedure Laterality Date   ABDOMINAL HYSTERECTOMY     APPENDECTOMY     CATARACT EXTRACTION     COLONOSCOPY     CYSTOCELE REPAIR     ESOPHAGOGASTRODUODENOSCOPY      KIDNEY SURGERY     benign tumor removal   ORTHOPEDIC SURGERY     Wrist & elbow   POLYPECTOMY     RECTOCELE REPAIR     TONSILECTOMY, ADENOIDECTOMY, BILATERAL MYRINGOTOMY AND TUBES Bilateral 1968   UPPER GASTROINTESTINAL ENDOSCOPY       OB History   No obstetric history on file.     Family History  Problem Relation Age of Onset   Congestive Heart Failure Mother    Cancer Mother        ovarian   Heart disease Father    Diabetes Father    Kidney disease Father    Diabetes Sister    Kidney disease Sister    Colon cancer Neg Hx  Colon polyps Neg Hx    Esophageal cancer Neg Hx    Rectal cancer Neg Hx    Stomach cancer Neg Hx     Social History   Tobacco Use   Smoking status: Former    Packs/day: 1.00    Years: 46.00    Pack years: 46.00    Types: Cigarettes    Quit date: 06/30/2009    Years since quitting: 11.2   Smokeless tobacco: Never  Vaping Use   Vaping Use: Never used  Substance Use Topics   Alcohol use: No   Drug use: No    Home Medications Prior to Admission medications   Medication Sig Start Date End Date Taking? Authorizing Provider  ACETAMINOPHEN-BUTALBITAL 50-325 MG TABS TAKE 1 TABLET BY MOUTH EVERY DAY AS NEEDED 09/30/19   Binnie Rail, MD  allopurinol (ZYLOPRIM) 300 MG tablet TAKE 1 TABLET BY MOUTH EVERY DAY 04/26/20   Binnie Rail, MD  amLODipine (NORVASC) 5 MG tablet Take 1.5 tablets (7.5 mg total) by mouth daily. 03/26/20   Binnie Rail, MD  amoxicillin-clavulanate (AUGMENTIN) 875-125 MG tablet Take 1 tablet by mouth 2 (two) times daily. 07/29/20   Binnie Rail, MD  butalbital-acetaminophen-caffeine (FIORICET) 50-325-40 MG tablet butalbital-acetaminophen-caffeine 50 mg-325 mg-40 mg tablet  TAKE 1 TABLET BY MOUTH ONCE DAILY AS NEEDED    [provider]  cetirizine (ZYRTEC ALLERGY) 10 MG tablet Take 1 tablet (10 mg total) by mouth daily. 05/06/19   Candee Furbish, MD  cholecalciferol (VITAMIN D3) 25 MCG (1000 UNIT) tablet Take 2,000 Units  by mouth daily.    [provider]  clopidogrel (PLAVIX) 75 MG tablet TAKE 1 TABLET BY MOUTH EVERY DAY 03/08/20   Binnie Rail, MD  cyclobenzaprine (FLEXERIL) 5 MG tablet Take 1 tablet (5 mg total) by mouth 3 (three) times daily as needed for muscle spasms. 08/03/20   Binnie Rail, MD  DULoxetine (CYMBALTA) 30 MG capsule Take 1 capsule (30 mg total) by mouth daily. For total of 90 mg daily 08/03/20   Binnie Rail, MD  DULoxetine (CYMBALTA) 60 MG capsule Take 1 capsule by mouth once daily 09/12/20   Binnie Rail, MD  ferrous sulfate 325 (65 FE) MG tablet Take 325 mg by mouth daily with breakfast.    [provider]  fluticasone (FLONASE) 50 MCG/ACT nasal spray Place 2 sprays into both nostrils daily. 05/06/19   Candee Furbish, MD  furosemide (LASIX) 20 MG tablet Take 20 mg by mouth. 3 times a week as needed for swelling    [provider]  ipratropium-albuterol (DUONEB) 0.5-2.5 (3) MG/3ML SOLN USE 1 VIAL IN NEBULIZER 4 TIMES DAILY. Generic: DUONEB 08/05/20   Spero Geralds, MD  metFORMIN (GLUCOPHAGE) 500 MG tablet Take 1 tablet (500 mg total) by mouth 2 (two) times daily with a meal. 10/02/19   Burns, [name], MD  metoprolol succinate (TOPROL-XL) 50 MG 24 hr tablet Take 50 mg by mouth daily. 12/30/19   [provider]  montelukast (SINGULAIR) 10 MG tablet TAKE 1 TABLET BY MOUTH ONCE DAILY AT BEDTIME . APPOINTMENT REQUIRED FOR FUTURE REFILLS 08/09/20   Spero Geralds, MD  pantoprazole (PROTONIX) 40 MG tablet Take 1 tablet (40 mg total) by mouth 2 (two) times daily before a meal. 02/14/20   Burns, [name], MD  Phenylephrine-DM-GG (MUCINEX FAST-MAX CONGEST COUGH PO) Take by mouth as needed.    [provider]  rosuvastatin (CRESTOR) 40 MG tablet Take  1 tablet (40 mg total) by mouth daily. 09/13/20   Sueanne Margarita, MD  telmisartan (MICARDIS) 40 MG tablet Take 40 mg by mouth daily. 12/02/19   [provider]  umeclidinium-vilanterol (ANORO ELLIPTA)  62.5-25 MCG/INH AEPB Inhale 1 puff into the lungs daily. 03/30/19   Binnie Rail, MD    Allergies    Gabapentin, Clindamycin, and Sulfa antibiotics  Review of Systems   Review of Systems  Neurological:  Positive for weakness.  All other systems reviewed and are negative.  Physical Exam Updated Vital Signs BP 121/70   Pulse 90   Temp 98.3 F (36.8 C) (Oral)   Resp (!) 23   Ht _0  (1.651 m)   Wt 101.2 kg   SpO2 96%   BMI 37.11 kg/m   Physical Exam Vitals and nursing note reviewed.  Constitutional:      Appearance: She is well-developed.     Comments: Elderly, appears to feel poorly  HENT:     Head: Normocephalic and atraumatic.  Eyes:     Conjunctiva/sclera: Conjunctivae normal.     Pupils: Pupils are equal, round, and reactive to light.  Cardiovascular:     Rate and Rhythm: Normal rate and regular rhythm.     Heart sounds: Normal heart sounds.  Pulmonary:     Breath sounds: Normal breath sounds.     Comments: Wet cough noted during exam, some coarse breath sounds noted, on 3L supplemental O2 Abdominal:     General: Bowel sounds are normal.     Palpations: Abdomen is soft.  Musculoskeletal:        General: Normal range of motion.     Cervical back: Normal range of motion.  Skin:    General: Skin is warm and dry.  Neurological:     Mental Status: She is alert and oriented to person, place, and time.    ED Results / Procedures / Treatments   Labs (all labs ordered are listed, but only abnormal results are displayed) Labs Reviewed  RESP PANEL BY RT-PCR (FLU A&B, COVID) ARPGX2 - Abnormal; Notable for the following components:      Result Value   SARS Coronavirus 2 by RT PCR POSITIVE (*)    All other components within normal limits  COMPREHENSIVE METABOLIC PANEL - Abnormal; Notable for the following components:   Sodium 133 (*)    Glucose, Bld 136 (*)    Creatinine, Ser 1.75 (*)    Calcium 8.4 (*)    GFR, Estimated 30 (*)    All other components within  normal limits  URINE CULTURE  CBC WITH DIFFERENTIAL/PLATELET  URINALYSIS, ROUTINE W REFLEX MICROSCOPIC    EKG EKG Interpretation  Date/Time:  Sunday September 25 2020 22:18:05 EDT Ventricular Rate:  91 PR Interval:  189 QRS Duration: 98 QT Interval:  355 QTC Calculation: 437 R Axis:   66 Text Interpretation: Sinus rhythm Minimal ST elevation, anterior leads No significant change since last tracing Confirmed by Wandra Arthurs ([id number]) on 09/25/2020 10:22:49 PM  Radiology DG Chest Port 1 View  Result Date: 09/25/2020 CLINICAL DATA:  Hypoxia.  Weakness. EXAM: PORTABLE CHEST 1 VIEW COMPARISON:  03/26/2019, CT 05/06/2020 FINDINGS: Lungs are hyperinflated with emphysema. Chronic bronchial thickening. Normal heart size. Stable mediastinal contours with aortic atherosclerosis. No focal airspace disease. No pleural effusion or pneumothorax. No acute osseous abnormalities are seen. IMPRESSION: Emphysema and chronic bronchial thickening. No superimposed acute process. Electronically Signed   By: Keith Rake M.D.   On:  09/25/2020 22:41    Procedures Procedures   CRITICAL CARE Performed by: Larene Pickett   Total critical care time: 35 minutes  Critical care time was exclusive of separately billable procedures and treating other patients.  Critical care was necessary to treat or prevent imminent or life-threatening deterioration.  Critical care was time spent personally by me on the following activities: development of treatment plan with patient and/or surrogate as well as nursing, discussions with consultants, evaluation of patient's response to treatment, examination of patient, obtaining history from patient or surrogate, ordering and performing treatments and interventions, ordering and review of laboratory studies, ordering and review of radiographic studies, pulse oximetry and re-evaluation of patient's condition.   Medications Ordered in ED Medications  dexamethasone (DECADRON)  injection 10 mg (has no administration in time range)  sodium chloride 0.9 % bolus 500 mL (500 mLs Intravenous New Bag/Given 09/25/20 2349)    ED Course  I have reviewed the triage vital signs and the nursing notes.  Pertinent labs & imaging results that were available during my care of the patient were reviewed by me and considered in my medical decision making (see chart for details).    MDM Rules/Calculators/A&P                           75 year old female presenting to the ED with generalized weakness.  States she is concerned she has COVID.  Her husband was recently hospitalized for same at [facility].  She endorses generalized body aches, decreased appetite, and some diarrhea.  She is afebrile and nontoxic in appearance here.  She does appear to be feeling poorly.  She does have a wet cough and some junky breath sounds bilaterally.  She is also noted to be hypoxic on room air to 86% with EMS, currently on 3 L and saturating well into the upper 90s.  Given her cough and new oxygen requirement and known COVID contacts, suspicion for same.  She also reports some dysuria so we will ensure no UTI.  Labs were obtained, appears to have mild AKI as SrCr today 1.75, baseline is around 1.4.  Her UA was without any signs of infection.  Her chest x-ray shows chronic emphysematous changes but no acute infiltrate.  Covid test is positive.  She continues requiring supplemental oxygen so will require admission.  She is agreeable to Decadron and remdesivir.  Discussed with Dr. Flossie Buffy-- will admit for ongoing care.  Final Clinical Impression(s) / ED Diagnoses Final diagnoses:  COVID-19  Hypoxia    Rx / DC Orders ED Discharge Orders     None        Larene Pickett, PA-C 09/26/20 0036    Drenda Freeze, MD 09/28/20 [phone]

## 2020-09-25 NOTE — ED Triage Notes (Addendum)
Per EMS, patient from home, generalized weakness and pain with urination. C/o bilateral flank pain. Symptoms worsening x1 week. +orthostatics  18g L AC 140m NS with EMS  4L [place] with EMS Initial RA O2 sat 86%

## 2020-09-26 ENCOUNTER — Encounter (HOSPITAL_COMMUNITY): Payer: Medicare Other

## 2020-09-26 ENCOUNTER — Encounter (HOSPITAL_COMMUNITY): Payer: Self-pay | Admitting: Family Medicine

## 2020-09-26 ENCOUNTER — Inpatient Hospital Stay (HOSPITAL_COMMUNITY): Payer: Medicare Other

## 2020-09-26 DIAGNOSIS — K589 Irritable bowel syndrome without diarrhea: Secondary | ICD-10-CM | POA: Diagnosis present

## 2020-09-26 DIAGNOSIS — K219 Gastro-esophageal reflux disease without esophagitis: Secondary | ICD-10-CM | POA: Diagnosis present

## 2020-09-26 DIAGNOSIS — M549 Dorsalgia, unspecified: Secondary | ICD-10-CM | POA: Diagnosis present

## 2020-09-26 DIAGNOSIS — E785 Hyperlipidemia, unspecified: Secondary | ICD-10-CM | POA: Diagnosis present

## 2020-09-26 DIAGNOSIS — F32A Depression, unspecified: Secondary | ICD-10-CM | POA: Diagnosis present

## 2020-09-26 DIAGNOSIS — E1122 Type 2 diabetes mellitus with diabetic chronic kidney disease: Secondary | ICD-10-CM | POA: Diagnosis present

## 2020-09-26 DIAGNOSIS — N1832 Chronic kidney disease, stage 3b: Secondary | ICD-10-CM | POA: Diagnosis present

## 2020-09-26 DIAGNOSIS — Z8673 Personal history of transient ischemic attack (TIA), and cerebral infarction without residual deficits: Secondary | ICD-10-CM | POA: Diagnosis not present

## 2020-09-26 DIAGNOSIS — Z87891 Personal history of nicotine dependence: Secondary | ICD-10-CM | POA: Diagnosis not present

## 2020-09-26 DIAGNOSIS — Z8249 Family history of ischemic heart disease and other diseases of the circulatory system: Secondary | ICD-10-CM | POA: Diagnosis not present

## 2020-09-26 DIAGNOSIS — J9601 Acute respiratory failure with hypoxia: Secondary | ICD-10-CM | POA: Diagnosis present

## 2020-09-26 DIAGNOSIS — I10 Essential (primary) hypertension: Secondary | ICD-10-CM

## 2020-09-26 DIAGNOSIS — J1282 Pneumonia due to coronavirus disease 2019: Secondary | ICD-10-CM | POA: Diagnosis present

## 2020-09-26 DIAGNOSIS — R7989 Other specified abnormal findings of blood chemistry: Secondary | ICD-10-CM | POA: Diagnosis not present

## 2020-09-26 DIAGNOSIS — Z833 Family history of diabetes mellitus: Secondary | ICD-10-CM | POA: Diagnosis not present

## 2020-09-26 DIAGNOSIS — J449 Chronic obstructive pulmonary disease, unspecified: Secondary | ICD-10-CM | POA: Diagnosis not present

## 2020-09-26 DIAGNOSIS — U071 COVID-19: Secondary | ICD-10-CM | POA: Diagnosis present

## 2020-09-26 DIAGNOSIS — J302 Other seasonal allergic rhinitis: Secondary | ICD-10-CM | POA: Diagnosis present

## 2020-09-26 DIAGNOSIS — G2581 Restless legs syndrome: Secondary | ICD-10-CM | POA: Diagnosis present

## 2020-09-26 DIAGNOSIS — Z841 Family history of disorders of kidney and ureter: Secondary | ICD-10-CM | POA: Diagnosis not present

## 2020-09-26 DIAGNOSIS — Z90710 Acquired absence of both cervix and uterus: Secondary | ICD-10-CM | POA: Diagnosis not present

## 2020-09-26 DIAGNOSIS — G8929 Other chronic pain: Secondary | ICD-10-CM | POA: Diagnosis present

## 2020-09-26 DIAGNOSIS — Z9049 Acquired absence of other specified parts of digestive tract: Secondary | ICD-10-CM | POA: Diagnosis not present

## 2020-09-26 DIAGNOSIS — R0902 Hypoxemia: Secondary | ICD-10-CM | POA: Diagnosis present

## 2020-09-26 DIAGNOSIS — E782 Mixed hyperlipidemia: Secondary | ICD-10-CM

## 2020-09-26 DIAGNOSIS — I2510 Atherosclerotic heart disease of native coronary artery without angina pectoris: Secondary | ICD-10-CM | POA: Diagnosis present

## 2020-09-26 DIAGNOSIS — N179 Acute kidney failure, unspecified: Secondary | ICD-10-CM

## 2020-09-26 DIAGNOSIS — J439 Emphysema, unspecified: Secondary | ICD-10-CM | POA: Diagnosis present

## 2020-09-26 DIAGNOSIS — I129 Hypertensive chronic kidney disease with stage 1 through stage 4 chronic kidney disease, or unspecified chronic kidney disease: Secondary | ICD-10-CM | POA: Diagnosis present

## 2020-09-26 LAB — GLUCOSE, CAPILLARY
Glucose-Capillary: 214 mg/dL — ABNORMAL HIGH (ref 70–99)
Glucose-Capillary: 210 mg/dL — ABNORMAL HIGH (ref 70–99)

## 2020-09-26 LAB — D-DIMER, QUANTITATIVE: D-Dimer, Quant: 0.72 ug/mL-FEU — ABNORMAL HIGH (ref 0.00–0.50)

## 2020-09-26 LAB — RESP PANEL BY RT-PCR (FLU A&B, COVID) ARPGX2
Influenza A by PCR: NEGATIVE
Influenza B by PCR: NEGATIVE
SARS Coronavirus 2 by RT PCR: POSITIVE — AB

## 2020-09-26 LAB — C-REACTIVE PROTEIN: CRP: 3.1 mg/dL — ABNORMAL HIGH (ref <1.0)

## 2020-09-26 MED ORDER — ALLOPURINOL 300 MG PO TABS
300.0000 mg | ORAL_TABLET | Freq: Every day | ORAL | Status: DC
  Administered 2020-09-26 – 2020-09-30 (×5): 300 mg via ORAL
  Filled 2020-09-26 (×5): qty 1

## 2020-09-26 MED ORDER — INSULIN ASPART 100 UNIT/ML IJ SOLN
0.0000 [IU] | Freq: Three times a day (TID) | INTRAMUSCULAR | Status: DC
  Administered 2020-09-26: 3 [IU] via SUBCUTANEOUS
  Administered 2020-09-27: 5 [IU] via SUBCUTANEOUS
  Administered 2020-09-27 – 2020-09-28 (×3): 3 [IU] via SUBCUTANEOUS
  Administered 2020-09-28: 5 [IU] via SUBCUTANEOUS
  Administered 2020-09-28: 3 [IU] via SUBCUTANEOUS
  Administered 2020-09-29 (×3): 5 [IU] via SUBCUTANEOUS
  Administered 2020-09-30: 2 [IU] via SUBCUTANEOUS

## 2020-09-26 MED ORDER — CLOPIDOGREL BISULFATE 75 MG PO TABS
75.0000 mg | ORAL_TABLET | Freq: Every day | ORAL | Status: DC
  Administered 2020-09-26 – 2020-09-30 (×5): 75 mg via ORAL
  Filled 2020-09-26 (×5): qty 1

## 2020-09-26 MED ORDER — PREDNISONE 50 MG PO TABS: 50.0000 mg | ORAL_TABLET | Freq: Every day | ORAL | Status: DC

## 2020-09-26 MED ORDER — UMECLIDINIUM-VILANTEROL 62.5-25 MCG/INH IN AEPB
1.0000 | INHALATION_SPRAY | Freq: Every day | RESPIRATORY_TRACT | Status: DC
  Administered 2020-09-26 – 2020-09-30 (×5): 1 via RESPIRATORY_TRACT
  Filled 2020-09-26 (×2): qty 14

## 2020-09-26 MED ORDER — SODIUM CHLORIDE 0.9 % IV SOLN
100.0000 mg | Freq: Once | INTRAVENOUS | Status: AC
  Administered 2020-09-26: 100 mg via INTRAVENOUS
  Filled 2020-09-26: qty 20

## 2020-09-26 MED ORDER — FLUTICASONE PROPIONATE 50 MCG/ACT NA SUSP
2.0000 | Freq: Every day | NASAL | Status: DC
  Filled 2020-09-26: qty 16

## 2020-09-26 MED ORDER — DEXAMETHASONE SODIUM PHOSPHATE 10 MG/ML IJ SOLN
10.0000 mg | Freq: Once | INTRAMUSCULAR | Status: AC
  Administered 2020-09-26: 10 mg via INTRAVENOUS
  Filled 2020-09-26: qty 1

## 2020-09-26 MED ORDER — PANTOPRAZOLE SODIUM 40 MG PO TBEC
40.0000 mg | DELAYED_RELEASE_TABLET | Freq: Two times a day (BID) | ORAL | Status: DC
  Administered 2020-09-26 – 2020-09-30 (×9): 40 mg via ORAL
  Filled 2020-09-26 (×9): qty 1

## 2020-09-26 MED ORDER — INSULIN ASPART 100 UNIT/ML IJ SOLN
0.0000 [IU] | Freq: Every day | INTRAMUSCULAR | Status: DC
Start: 2020-09-26 — End: 2020-09-30
  Administered 2020-09-26: 2 [IU] via SUBCUTANEOUS
  Administered 2020-09-29: 3 [IU] via SUBCUTANEOUS

## 2020-09-26 MED ORDER — AMLODIPINE BESYLATE 5 MG PO TABS
7.5000 mg | ORAL_TABLET | Freq: Every day | ORAL | Status: DC
  Administered 2020-09-26 – 2020-09-30 (×5): 7.5 mg via ORAL
  Filled 2020-09-26 (×5): qty 2

## 2020-09-26 MED ORDER — METHYLPREDNISOLONE SODIUM SUCC 125 MG IJ SOLR
125.0000 mg | INTRAMUSCULAR | Status: DC
  Administered 2020-09-26 – 2020-09-28 (×3): 125 mg via INTRAVENOUS
  Filled 2020-09-26 (×3): qty 2

## 2020-09-26 MED ORDER — FERROUS SULFATE 325 (65 FE) MG PO TABS
325.0000 mg | ORAL_TABLET | Freq: Every day | ORAL | Status: DC
  Administered 2020-09-26 – 2020-09-30 (×5): 325 mg via ORAL
  Filled 2020-09-26 (×5): qty 1

## 2020-09-26 MED ORDER — SODIUM CHLORIDE 0.9 % IV SOLN
100.0000 mg | Freq: Every day | INTRAVENOUS | Status: AC
  Administered 2020-09-27 – 2020-09-30 (×4): 100 mg via INTRAVENOUS
  Filled 2020-09-26 (×4): qty 20

## 2020-09-26 MED ORDER — METOPROLOL SUCCINATE ER 50 MG PO TB24
50.0000 mg | ORAL_TABLET | Freq: Every day | ORAL | Status: DC
  Administered 2020-09-26 – 2020-09-30 (×5): 50 mg via ORAL
  Filled 2020-09-26 (×5): qty 1

## 2020-09-26 MED ORDER — ENOXAPARIN SODIUM 40 MG/0.4ML IJ SOSY
40.0000 mg | PREFILLED_SYRINGE | INTRAMUSCULAR | Status: DC
  Administered 2020-09-26 – 2020-09-30 (×5): 40 mg via SUBCUTANEOUS
  Filled 2020-09-26 (×5): qty 0.4

## 2020-09-26 MED ORDER — FLUTICASONE PROPIONATE 50 MCG/ACT NA SUSP
2.0000 | Freq: Every day | NASAL | Status: DC
  Administered 2020-09-27 – 2020-09-30 (×5): 2 via NASAL
  Filled 2020-09-26: qty 16

## 2020-09-26 MED ORDER — DULOXETINE HCL 60 MG PO CPEP
90.0000 mg | ORAL_CAPSULE | Freq: Every day | ORAL | Status: DC
  Administered 2020-09-26 – 2020-09-30 (×5): 90 mg via ORAL
  Filled 2020-09-26 (×4): qty 1
  Filled 2020-09-26: qty 3

## 2020-09-26 MED ORDER — ACETAMINOPHEN 325 MG PO TABS
650.0000 mg | ORAL_TABLET | Freq: Four times a day (QID) | ORAL | Status: DC | PRN
  Administered 2020-09-26 – 2020-09-29 (×4): 650 mg via ORAL
  Filled 2020-09-26 (×6): qty 2

## 2020-09-26 MED ORDER — METHYLPREDNISOLONE SODIUM SUCC 125 MG IJ SOLR: 0.5000 mg/kg | Freq: Two times a day (BID) | INTRAMUSCULAR | Status: DC

## 2020-09-26 MED ORDER — SODIUM CHLORIDE 0.9 % IV SOLN
INTRAVENOUS | Status: AC
Start: 2020-09-26 — End: 2020-09-27
  Administered 2020-09-26: 1000 mL via INTRAVENOUS

## 2020-09-26 MED ORDER — ROSUVASTATIN CALCIUM 20 MG PO TABS
40.0000 mg | ORAL_TABLET | Freq: Every day | ORAL | Status: DC
  Administered 2020-09-26 – 2020-09-30 (×5): 40 mg via ORAL
  Filled 2020-09-26 (×5): qty 2

## 2020-09-26 NOTE — ED Notes (Signed)
Lab called, patient is COVID positive

## 2020-09-26 NOTE — ED Notes (Signed)
Patient had a small bowel movement so she was cleaned up. RN notified patient said she felt like she needs to urinate but can not go. Patients Pure Wick container is still empty.

## 2020-09-26 NOTE — ED Notes (Signed)
Patient was given her lunch tray. 

## 2020-09-26 NOTE — ED Notes (Signed)
Patient was cleaned up from a bowel movement. Patients draw sheet and brief were changed and a new Pure Elza Rafter was placed. Patient was given warm blankets,and her water and phone were placed bedside.

## 2020-09-26 NOTE — ED Notes (Signed)
Bladder scan performed per RN request.

## 2020-09-26 NOTE — ED Notes (Signed)
Patient was asleep upon walking into the room. Woke patient up briefly to give medication.

## 2020-09-26 NOTE — ED Notes (Signed)
Called transport to transport patient to [facility]

## 2020-09-26 NOTE — H&P (Addendum)
History and Physical    Kerry Powell [id number] DOB: 28-Apr-1945 DOA: 09/25/2020  PCP: Binnie Rail, MD  Patient coming from: Home  I have personally briefly reviewed patient's old medical records in [place]  Chief Complaint: worsening shortness of breath  HPI: Kerry Powell is a 75 y.o. female with medical history significant for CVA, CAD, hypertension, GERD, COPD, IBS, type 2 diabetes, CKD who presents with concerns of worsening shortness of breath.  For the past week she has been feeling generalized body and joint ache.  Has been feeling weak with decreased appetite and has not been able to drink much.  Also has been coughing and needing to use her nebulizer more in the past 2 days. Reports chronic exertion dyspnea and it is only slightly worse now. Husband was also sick at home with COVID.  Thinks they contracted it at the hospital when her husband was in the ICU for procedure. Denies any nausea, vomiting or diarrhea.  She has received both doses of Pfizer vaccine but no booster. Denies tobacco use.  ED Course: She had temperature of 99.59F, and was tachycardic and hypoxic down to 87% on room air and placed on 3 L.  No leukocytosis.  Creatinine is elevated 1.75 from a prior 1.5.  COVID PCR test is positive.  Chest x-ray with no acute findings.  Review of Systems: Constitutional: No Weight Change, No Fever ENT/Mouth: No sore throat, No Rhinorrhea Eyes: No Eye Pain, No Vision Changes Cardiovascular: No Chest Pain, + SOB, No PND, + Dyspnea on Exertion, No Orthopnea,  No Edema, No Palpitations Respiratory: + Cough, No Sputum Gastrointestinal: No Nausea, No Vomiting, No Diarrhea, No Constipation, No Pain Genitourinary: no Urinary Incontinence, No Urgency, No Flank Pain Musculoskeletal: No Arthralgias, No Myalgias Skin: No Skin Lesions, No Pruritus, Neuro: no Weakness, No Numbness Psych: No Anxiety/Panic, No Depression, + decrease appetite Heme/Lymph: No Bruising, No  Bleeding  Past Medical History:  Diagnosis Date   Allergy    Anemia    Anxiety    Arthritis    BACK PAIN, CHRONIC 04/04/2007   Qualifier: Diagnosis of  By: Carlean Purl MD, FACG, [place] AFFECTIVE DISORDER 04/04/2007   Qualifier: History of  By: Carlean Purl MD, Tonna Boehringer E    CAD (coronary artery disease), native coronary artery    mild CAD with no obstructive disease by coronary CTA 06/2019   Cataract    removed both eyes   Chronic neck pain 04/30/2015   Cervical spondylosis [place] [facility] Intermittent numbness and tingling in occipital region and headaches S/p 2 occipital nerve blocks - only temp relief no relief with gabapentin, ultram, brace MRI neck - facet arthrosis    CKD (chronic kidney disease) 04/26/2015   COPD (chronic obstructive pulmonary disease) ([place])    pt denies - told this while was smoking -   Cough 04/26/2015   Depression 07/01/2017   Diabetes ([place]) 04/27/2015   Diagnosed 03/2015    Dizziness 06/10/2017   Dysphagia 04/26/2015   DYSPNEA 12/24/2007   Qualifier: Diagnosis of  By: Glennis Brink     Elevated TSH 08/09/2015   GASTRIC ULCER, ACUTE 04/07/2007   Annotation: EGD 04/07/07 Qualifier: Diagnosis of  By: Carlean Purl MD, Tonna Boehringer E    GASTRITIS 04/07/2007   Annotation: EGD 04/07/07 Qualifier: Diagnosis of  By: Carlean Purl MD, Tonna Boehringer E    GERD (gastroesophageal reflux disease)    H/O renal cell carcinoma 05/23/2017   Dx 2010, s/p cryoablation  Follows with urology - Dr Lovena Neighbours   HIATAL HERNIA 04/07/2007   Annotation: EGD 04/07/07 Qualifier: Diagnosis of  By: Carlean Purl MD, Tonna Boehringer E    History of CVA (cerebrovascular accident) 04/26/2015   History of gout 04/26/2015   Hyperlipidemia    Hypertension    Irritable bowel syndrome 04/04/2007   Qualifier: Diagnosis of  By: Carlean Purl MD, Dimas Millin    Kidney tumor (benign)    Nausea and vomiting 09/09/2017   Occipital neuralgia 05/09/2016   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 Dr Dimas Millin in [place] 04/04/2007    Qualifier: Diagnosis of  By: Carlean Purl MD, Dimas Millin    Personal history of colonic adenoma 06/07/2012   POSTNASAL DRIP SYNDROME 12/29/2007   Qualifier: Diagnosis of  By: Chase Caller MD, [name]     RESTLESS LEG SYNDROME 04/04/2007   Qualifier: Diagnosis of  By: Carlean Purl MD, FACG, [place], 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   Qualifier: Diagnosis of  By: Carlean Purl MD, Tonna Boehringer E    Sleep difficulties 09/09/2017   Snoring 04/26/2015   Stroke ([place])    x2 2011   Thoracic aortic aneurysm ([place]) 02/06/2018   Seen on MRI 01/25/18 of thoracic spine ordered by [facility] -- referred to CTS to monitor  -- 3.5 cm   TIA (transient ischemic attack)    TOBACCO ABUSE 12/17/2007   Qualifier: Diagnosis of  By: Chase Caller MD, [name]     Trigeminal neuralgia of right side of face 02/08/2016   URI (upper respiratory infection) 02/08/2016   Vitamin D deficiency 03/12/2018    Past Surgical History:  Procedure Laterality Date   ABDOMINAL HYSTERECTOMY     APPENDECTOMY     CATARACT EXTRACTION     COLONOSCOPY     CYSTOCELE REPAIR     ESOPHAGOGASTRODUODENOSCOPY     KIDNEY SURGERY     benign tumor removal   ORTHOPEDIC SURGERY     Wrist & elbow   POLYPECTOMY     RECTOCELE REPAIR     TONSILECTOMY, ADENOIDECTOMY, BILATERAL MYRINGOTOMY AND TUBES Bilateral 1968   UPPER GASTROINTESTINAL ENDOSCOPY       reports that she quit smoking about 11 years ago. She has a 46.00 pack-year smoking history. She has never used smokeless tobacco. She reports that she does not drink alcohol and does not use drugs. Social History  Allergies  Allergen Reactions   Gabapentin Other (See Comments)    Fluid retention    Clindamycin Rash   Sulfa Antibiotics Nausea And Vomiting    Family History  Problem Relation Age of Onset   Congestive Heart Failure Mother    Cancer Mother        ovarian   Heart disease Father    Diabetes Father    Kidney disease Father    Diabetes Sister    Kidney disease Sister    Colon cancer Neg Hx     Colon polyps Neg Hx    Esophageal cancer Neg Hx    Rectal cancer Neg Hx    Stomach cancer Neg Hx      Prior to Admission medications   Medication Sig Start Date End Date Taking? Authorizing Provider  ACETAMINOPHEN-BUTALBITAL 50-325 MG TABS TAKE 1 TABLET BY MOUTH EVERY DAY AS NEEDED 09/30/19   Binnie Rail, MD  allopurinol (ZYLOPRIM) 300 MG tablet TAKE 1 TABLET BY MOUTH EVERY DAY 04/26/20   Binnie Rail, MD  amLODipine (NORVASC) 5 MG tablet Take 1.5 tablets (7.5 mg total)  by mouth daily. 03/26/20   Binnie Rail, MD  amoxicillin-clavulanate (AUGMENTIN) 875-125 MG tablet Take 1 tablet by mouth 2 (two) times daily. 07/29/20   Binnie Rail, MD  butalbital-acetaminophen-caffeine (FIORICET) 50-325-40 MG tablet butalbital-acetaminophen-caffeine 50 mg-325 mg-40 mg tablet  TAKE 1 TABLET BY MOUTH ONCE DAILY AS NEEDED    [provider]  cetirizine (ZYRTEC ALLERGY) 10 MG tablet Take 1 tablet (10 mg total) by mouth daily. 05/06/19   Candee Furbish, MD  cholecalciferol (VITAMIN D3) 25 MCG (1000 UNIT) tablet Take 2,000 Units by mouth daily.    [provider]  clopidogrel (PLAVIX) 75 MG tablet TAKE 1 TABLET BY MOUTH EVERY DAY 03/08/20   Binnie Rail, MD  cyclobenzaprine (FLEXERIL) 5 MG tablet Take 1 tablet (5 mg total) by mouth 3 (three) times daily as needed for muscle spasms. 08/03/20   Binnie Rail, MD  DULoxetine (CYMBALTA) 30 MG capsule Take 1 capsule (30 mg total) by mouth daily. For total of 90 mg daily 08/03/20   Binnie Rail, MD  DULoxetine (CYMBALTA) 60 MG capsule Take 1 capsule by mouth once daily 09/12/20   Binnie Rail, MD  ferrous sulfate 325 (65 FE) MG tablet Take 325 mg by mouth daily with breakfast.    [provider]  fluticasone (FLONASE) 50 MCG/ACT nasal spray Place 2 sprays into both nostrils daily. 05/06/19   Candee Furbish, MD  furosemide (LASIX) 20 MG tablet Take 20 mg by mouth. 3 times a week as needed for swelling    [provider]   ipratropium-albuterol (DUONEB) 0.5-2.5 (3) MG/3ML SOLN USE 1 VIAL IN NEBULIZER 4 TIMES DAILY. Generic: DUONEB 08/05/20   Spero Geralds, MD  metFORMIN (GLUCOPHAGE) 500 MG tablet Take 1 tablet (500 mg total) by mouth 2 (two) times daily with a meal. 10/02/19   Burns, [name], MD  metoprolol succinate (TOPROL-XL) 50 MG 24 hr tablet Take 50 mg by mouth daily. 12/30/19   [provider]  montelukast (SINGULAIR) 10 MG tablet TAKE 1 TABLET BY MOUTH ONCE DAILY AT BEDTIME . APPOINTMENT REQUIRED FOR FUTURE REFILLS 08/09/20   Spero Geralds, MD  pantoprazole (PROTONIX) 40 MG tablet Take 1 tablet (40 mg total) by mouth 2 (two) times daily before a meal. 02/14/20   Burns, [name], MD  Phenylephrine-DM-GG (MUCINEX FAST-MAX CONGEST COUGH PO) Take by mouth as needed.    [provider]  rosuvastatin (CRESTOR) 40 MG tablet Take 1 tablet (40 mg total) by mouth daily. 09/13/20   Sueanne Margarita, MD  telmisartan (MICARDIS) 40 MG tablet Take 40 mg by mouth daily. 12/02/19   [provider]  umeclidinium-vilanterol (ANORO ELLIPTA) 62.5-25 MCG/INH AEPB Inhale 1 puff into the lungs daily. 03/30/19   Binnie Rail, MD    Physical Exam: Vitals:   09/25/20 2300 09/25/20 2330 09/26/20 0000 09/26/20 0004  BP: 124/71 130/71 121/70   Pulse: 91 88 90   Resp: 15 (!) 26 (!) 23   Temp:    98.3 F (36.8 C)  TempSrc:    Oral  SpO2: 95% 99% 96%   Weight:      Height:        Constitutional: NAD, calm, comfortable, non-toxic appearing elderly female laying in bed Vitals:   09/25/20 2300 09/25/20 2330 09/26/20 0000 09/26/20 0004  BP: 124/71 130/71 121/70   Pulse: 91 88 90   Resp: 15 (!) 26 (!) 23   Temp:    98.3 F (  36.8 C)  TempSrc:    Oral  SpO2: 95% 99% 96%   Weight:      Height:       Eyes: PERRL, lids and conjunctivae normal ENMT: Mucous membranes are moist.  Neck: normal, supple, Respiratory: clear to auscultation bilaterally, no wheezing, no crackles. Normal respiratory effort on 2L.  No accessory muscle use.  Cardiovascular: Regular rate and rhythm, no murmurs / rubs / gallops. No extremity edema.  Abdomen: no tenderness, no masses palpated. . Bowel sounds positive.  Musculoskeletal: no clubbing / cyanosis. No joint deformity upper and lower extremities. Good ROM, no contractures. Normal muscle tone.  Skin: no rashes, lesions, ulcers. No induration Neurologic: CN 2-12 grossly intact. Sensation intact. Strength 5/5 in all 4.  Psychiatric: Normal judgment and insight. Alert and oriented x 3. Normal mood.     Labs on Admission: I have personally reviewed following labs and imaging studies  CBC: Recent Labs  Lab 09/25/20 2233  WBC 4.2  NEUTROABS 2.6  HGB 14.7  HCT 45.4  MCV 96.8  PLT [id number]   Basic Metabolic Panel: Recent Labs  Lab 09/25/20 2233  NA 133*  K 4.6  CL 99  CO2 26  GLUCOSE 136*  BUN 19  CREATININE 1.75*  CALCIUM 8.4*   GFR: Estimated Creatinine Clearance: 32.8 mL/min (A) (by C-G formula based on SCr of 1.75 mg/dL (H)). Liver Function Tests: Recent Labs  Lab 09/25/20 2233  AST 22  ALT 17  ALKPHOS 82  BILITOT 0.6  PROT 7.3  ALBUMIN 3.8   No results for input(s): LIPASE, AMYLASE in the last 168 hours. No results for input(s): AMMONIA in the last 168 hours. Coagulation Profile: No results for input(s): INR, PROTIME in the last 168 hours. Cardiac Enzymes: No results for input(s): CKTOTAL, CKMB, CKMBINDEX, TROPONINI in the last 168 hours. BNP (last 3 results) No results for input(s): PROBNP in the last 8760 hours. HbA1C: No results for input(s): HGBA1C in the last 72 hours. CBG: No results for input(s): GLUCAP in the last 168 hours. Lipid Profile: No results for input(s): CHOL, HDL, LDLCALC, TRIG, CHOLHDL, LDLDIRECT in the last 72 hours. Thyroid Function Tests: No results for input(s): TSH, T4TOTAL, FREET4, T3FREE, THYROIDAB in the last 72 hours. Anemia Panel: No results for input(s): VITAMINB12, FOLATE, FERRITIN, TIBC, IRON,  RETICCTPCT in the last 72 hours. Urine analysis:    Component Value Date/Time   COLORURINE YELLOW 09/25/2020 2159   APPEARANCEUR CLEAR 09/25/2020 2159   LABSPEC 1.017 09/25/2020 2159   PHURINE 6.0 09/25/2020 2159   GLUCOSEU NEGATIVE 09/25/2020 2159   GLUCOSEU NEGATIVE 08/01/2020 1336   HGBUR NEGATIVE 09/25/2020 2159   BILIRUBINUR NEGATIVE 09/25/2020 2159   KETONESUR NEGATIVE 09/25/2020 2159   PROTEINUR NEGATIVE 09/25/2020 2159   UROBILINOGEN 0.2 08/01/2020 1336   NITRITE NEGATIVE 09/25/2020 2159   LEUKOCYTESUR NEGATIVE 09/25/2020 2159    Radiological Exams on Admission: DG Chest Port 1 View  Result Date: 09/25/2020 CLINICAL DATA:  Hypoxia.  Weakness. EXAM: PORTABLE CHEST 1 VIEW COMPARISON:  03/26/2019, CT 05/06/2020 FINDINGS: Lungs are hyperinflated with emphysema. Chronic bronchial thickening. Normal heart size. Stable mediastinal contours with aortic atherosclerosis. No focal airspace disease. No pleural effusion or pneumothorax. No acute osseous abnormalities are seen. IMPRESSION: Emphysema and chronic bronchial thickening. No superimposed acute process. Electronically Signed   By: [place] Rake M.D.   On: 09/25/2020 22:41      Assessment/Plan  Acute Hypoxic respiratory failure secondary to COVID-pneumonia -admitted on 3L. CXR negative but suspect lag  time on imaging. Will check stat D-dimer although low suspicion for PE.  -continue IV steroids  -continue remdesivir  AKI -creatinine of 1.75 from 1.5 -continuous IV fluids  -hold ACE  COPD -does not appear in exacerbation  Hx of CVA -continue plavix   HTN -continue metoprolol and amlodipine -Hold ACE due to AKI   HLD continue statin   DVT prophylaxis:.Lovenox Code Status: Full Family Communication: Plan discussed with patient at bedside  disposition Plan: Home with at least 2 midnight stays  Consults called:  Admission status: inpatient  Level of care: Med-Surg  Status is: Inpatient  Remains inpatient  appropriate because:Inpatient level of care appropriate due to severity of illness  Dispo: The patient is from: Home              Anticipated d/c is to: Home              Patient currently is not medically stable to d/c.   Difficult to place patient No         Orene Desanctis DO [facility]   If 7PM-7AM, please contact night-coverage [email or web address]   09/26/2020, 12:49 AM

## 2020-09-26 NOTE — Progress Notes (Signed)
PROGRESS NOTE    Kerry Powell  [id number] DOB: [birth year] DOA: 09/25/2020 PCP: Binnie Rail, MD   Chief Complaint  Patient presents with   Weakness   Brief Narrative:   Kerry Powell is Kerry Powell 75 y.o. female with medical history significant for CVA, CAD, hypertension, GERD, COPD, IBS, type 2 diabetes, CKD who presents with concerns of worsening shortness of breath.  She's been admitted for COVID 19 virus infection with hypoxia.  Vaccinated with [employer] x2, but no booster.     Assessment & Plan:   Principal Problem:   Acute respiratory failure with hypoxia (HCC) Active Problems:   COPD (chronic obstructive pulmonary disease) (HCC)   History of CVA (cerebrovascular accident)   Essential hypertension, benign   Hyperlipidemia   AKI (acute kidney injury) ([place])  Acute Hypoxic Respiratory Failure  COVID 19 Virus Infection Currently on 4 L [place] CXR with emphysema and chronic bronchial thickening Will follow CT chest (without contrast given renal function) without clear explanation on CXR (possible hypoxia related to COPD findings, but these are likely chronic) D dimer mildly elevated, follow LE [place] Remdesivir 8/1-present Solumedrol 8/1-present Discussed possibility of baricitinib/actemra if she were to decline.  Discussed general and specific risks (infection - vte/malignancy with baricitinib).  Would discuss and confirm if needed.   Strict I/O, daily weights IS, flutter, OOB, therapy  COVID-19 Labs  Recent Labs    09/26/20 0142  DDIMER 0.72*    Lab Results  Component Value Date   SARSCOV2NAA POSITIVE (Kerry Powell   SARSCOV2NAA NOT DETECTED 03/26/2019   [place] Not Detected 02/17/2019   [place] Not Detected 11/11/2018   Elevated D dimer Mild and suspect this is 2/2 inflammation related to covid rather than VTE  Follow LE [place] Consider CT PE study (hold off given aki) vs VQ scan if worsening   AKI on CKD IIIb Baseline creatinine appears to be around 1.5 1.75  on presentation ACEi being held Follow UA Gentle IVF x 24 hrs  COPD Not on baseline o2 Follow CT scan  Inhalers  Hypertension Continue metoprolol and amlodipine ACEi on hold  HLD Continue statin   Hx CVA Continue plavix   DVT prophylaxis: lovenox Code Status: full  Family Communication: none at bedside Disposition:   Status is: Inpatient  Remains inpatient appropriate because:Inpatient level of care appropriate due to severity of illness  Dispo: The patient is from: Home              Anticipated d/c is to: Home              Patient currently is not medically stable to d/c.   Difficult to place patient No       Consultants:  none  Procedures:  none  Antimicrobials:  Anti-infectives (From admission, onward)    Start     Dose/Rate Route Frequency Ordered Stop   09/27/20 1000  remdesivir 100 mg in sodium chloride 0.9 % 100 mL IVPB       See Hyperspace for full Linked Orders Report.   100 mg 200 mL/hr over 30 Minutes Intravenous Daily 09/26/20 0019 10/01/20 0959   09/26/20 0130  remdesivir 100 mg in sodium chloride 0.9 % 100 mL IVPB       See Hyperspace for full Linked Orders Report.   100 mg 200 mL/hr over 30 Minutes Intravenous  Once 09/26/20 0019 09/26/20 0310   09/26/20 0045  remdesivir 100 mg in sodium chloride 0.9 % 100 mL IVPB  See Hyperspace for full Linked Orders Report.   100 mg 200 mL/hr over 30 Minutes Intravenous  Once 09/26/20 0019 09/26/20 0235          Subjective: Denies new complaints  Objective: Vitals:   09/26/20 0600 09/26/20 0700 09/26/20 0730 09/26/20 0800  BP: (!) 124/56 125/77 122/75 131/71  Pulse: 81 76 76 78  Resp: '19 19 15 18  '$ Temp:      TempSrc:      SpO2: 90% 91% 90% 91%  Weight:      Height:        Intake/Output Summary (Last 24 hours) at 09/26/2020 0845 Last data filed at 09/26/2020 0310 Gross per 24 hour  Intake 698.99 ml  Output --  Net 698.99 ml   Filed Weights   09/25/20 2112  Weight: 101.2 kg     Examination:  General exam: Appears calm and comfortable  Respiratory system: unlabored on 4 L  Cardiovascular system: RRR Gastrointestinal system: Abdomen is nondistended, soft and nontender.  Central nervous system: Alert and oriented. No focal neurological deficits. Extremities: no LEE Skin: No rashes, lesions or ulcers Psychiatry: Judgement and insight appear normal. Mood & affect appropriate.     Data Reviewed: I have personally reviewed following labs and imaging studies  CBC: Recent Labs  Lab 09/25/20 2233  WBC 4.2  NEUTROABS 2.6  HGB 14.7  HCT 45.4  MCV 96.8  PLT [id number]    Basic Metabolic Panel: Recent Labs  Lab 09/25/20 2233  NA 133*  K 4.6  CL 99  CO2 26  GLUCOSE 136*  BUN 19  CREATININE 1.75*  CALCIUM 8.4*    GFR: Estimated Creatinine Clearance: 32.8 mL/min (Kerry Powell (Flu Kerry [employer]vens&B, Covid) Nasopharyngeal Swab     Status: Abnormal   Collection Time: 09/25/20 10:00 PM   Specimen: Nasopharyngeal Swab; Nasopharyngeal(NP) swabs in vial transport medium  Result Value Ref Range Status   SARS Coronavirus 2 by RT PCR POSITIVE (Kerry Powell    Comment: RESULT CALLED TO, READ BACK BY AND VERIFIED WITH: Kerry Powell AT 0009 ON 09/26/20 BY MAJ (NOTE) SARS-CoV-2 target nucleic acids are DETECTED.  The SARS-CoV-2 RNA is generally detectable in upper respiratory specimens during the acute phase of infection. Positive results are indicative of the presence of the identified virus, but do not rule out bacterial infection or co-infection with other pathogens not detected by the test. Clinical correlation with patient history and other diagnostic information  is necessary to determine patient infection status. The expected result is Negative.  Fact Sheet for Patients: EntrepreneurPulse.com.au  Fact Sheet for Healthcare Providers: IncredibleEmployment.be  This test is not yet approved or cleared by the [place] FDA and  has been authorized for detection and/or diagnosis of SARS-CoV-2 by FDA under an Emergency Use Authorization (EUA).  This EUA will remain in effect (meaning this t est can be used) for the duration of  the COVID-19 declaration under Section 564(b)(1) of the Act, 21 U.S.C. section 360bbb-3(b)(1), unless the authorization is terminated or revoked sooner.     Influenza Rithwik Schmieg by PCR NEGATIVE NEGATIVE Powell   Influenza B by  PCR NEGATIVE NEGATIVE Powell    Comment: (NOTE) The Xpert Xpress SARS-CoV-2/FLU/RSV plus assay is intended as an aid in the diagnosis of influenza from Nasopharyngeal swab specimens and should not be used as Shellene Sweigert sole basis for treatment. Nasal washings and aspirates are unacceptable for Xpert Xpress SARS-CoV-2/FLU/RSV testing.  Fact Sheet for Patients: EntrepreneurPulse.com.au  Fact Sheet for Healthcare Providers: IncredibleEmployment.be  This test is not yet approved or cleared by the [place] FDA and has been authorized for detection and/or diagnosis of SARS-CoV-2 by FDA under an Emergency Use Authorization (EUA). This EUA will remain in effect (meaning this test can be used) for the duration of the COVID-19 declaration under Section 564(b)(1) of the Act, 21 U.S.C. section 360bbb-3(b)(1), unless the authorization is terminated or revoked.  Performed at [facility], [place] [address] [place], [place] [address]          Radiology Studies: [facility] Chest Port 1 View  Result Date: 09/25/2020 CLINICAL DATA:  Hypoxia.  Weakness. EXAM: PORTABLE CHEST 1 VIEW COMPARISON:  03/26/2019, CT 05/06/2020 FINDINGS: Lungs  are hyperinflated with emphysema. Chronic bronchial thickening. Normal heart size. Stable mediastinal contours with aortic atherosclerosis. No focal airspace disease. No pleural effusion or pneumothorax. No acute osseous abnormalities are seen. IMPRESSION: Emphysema and chronic bronchial thickening. No superimposed acute process. Electronically Signed   By: [place] Rake M.D.   On: Powell 22:41        Scheduled Meds:  allopurinol  300 mg Oral Daily   amLODipine  7.5 mg Oral Daily   clopidogrel  75 mg Oral Daily   DULoxetine  90 mg Oral Daily   enoxaparin (LOVENOX) injection  40 mg Subcutaneous Q24H   ferrous sulfate  325 mg Oral Q breakfast   [START ON 09/27/2020] methylPREDNISolone (SOLU-MEDROL) injection  0.5 mg/kg Intravenous Q12H   Followed by   Derrill Memo ON 09/30/2020] predniSONE  50 mg Oral Daily   metoprolol succinate  50 mg Oral Daily   pantoprazole  40 mg Oral BID AC   rosuvastatin  40 mg Oral Daily   umeclidinium-vilanterol  1 puff Inhalation Daily   Continuous Infusions:  sodium chloride 75 mL/hr at 09/26/20 0141   [START ON 09/27/2020] remdesivir 100 mg in NS 100 mL       LOS: 0 days    Time spent: over 30 min    Fayrene Helper, MD [facility]   To contact the attending provider between 7A-7P or the covering provider during after hours 7P-7A, please log into the web site [email or web address] and access using universal [place] password for that web site. If you do not have the password, please call the hospital operator.  09/26/2020, 8:45 AM

## 2020-09-27 ENCOUNTER — Inpatient Hospital Stay (HOSPITAL_COMMUNITY): Payer: Medicare Other

## 2020-09-27 ENCOUNTER — Ambulatory Visit: Payer: Medicare Other | Admitting: Obstetrics and Gynecology

## 2020-09-27 DIAGNOSIS — R7989 Other specified abnormal findings of blood chemistry: Secondary | ICD-10-CM | POA: Diagnosis not present

## 2020-09-27 DIAGNOSIS — U071 COVID-19: Secondary | ICD-10-CM | POA: Diagnosis not present

## 2020-09-27 LAB — CBC WITH DIFFERENTIAL/PLATELET
Abs Immature Granulocytes: 0.01 10*3/uL (ref 0.00–0.07)
Basophils Absolute: 0 10*3/uL (ref 0.0–0.1)
Basophils Relative: 0 %
Eosinophils Absolute: 0 10*3/uL (ref 0.0–0.5)
Eosinophils Relative: 0 %
HCT: 45.4 % (ref 36.0–46.0)
Hemoglobin: 14.5 g/dL (ref 12.0–15.0)
Immature Granulocytes: 0 %
Lymphocytes Relative: 26 %
Lymphs Abs: 1.1 10*3/uL (ref 0.7–4.0)
MCH: 31.2 pg (ref 26.0–34.0)
MCHC: 31.9 g/dL (ref 30.0–36.0)
MCV: 97.6 fL (ref 80.0–100.0)
Monocytes Absolute: 0.2 10*3/uL (ref 0.1–1.0)
Monocytes Relative: 4 %
Neutro Abs: 2.8 10*3/uL (ref 1.7–7.7)
Neutrophils Relative %: 70 %
Platelets: 266 10*3/uL (ref 150–400)
RBC: 4.65 MIL/uL (ref 3.87–5.11)
RDW: 13.4 % (ref 11.5–15.5)
WBC: 4.1 10*3/uL (ref 4.0–10.5)
nRBC: 0 % (ref 0.0–0.2)

## 2020-09-27 LAB — GLUCOSE, CAPILLARY
Glucose-Capillary: 211 mg/dL — ABNORMAL HIGH (ref 70–99)
Glucose-Capillary: 226 mg/dL — ABNORMAL HIGH (ref 70–99)
Glucose-Capillary: 228 mg/dL — ABNORMAL HIGH (ref 70–99)
Glucose-Capillary: 252 mg/dL — ABNORMAL HIGH (ref 70–99)
Glucose-Capillary: 190 mg/dL — ABNORMAL HIGH (ref 70–99)

## 2020-09-27 LAB — COMPREHENSIVE METABOLIC PANEL
ALT: 20 U/L (ref 0–44)
AST: 27 U/L (ref 15–41)
Albumin: 3.9 g/dL (ref 3.5–5.0)
Alkaline Phosphatase: 75 U/L (ref 38–126)
Anion gap: 11 (ref 5–15)
BUN: 23 mg/dL (ref 8–23)
CO2: 25 mmol/L (ref 22–32)
Calcium: 8.6 mg/dL — ABNORMAL LOW (ref 8.9–10.3)
Chloride: 100 mmol/L (ref 98–111)
Creatinine, Ser: 1.51 mg/dL — ABNORMAL HIGH (ref 0.44–1.00)
GFR, Estimated: 36 mL/min — ABNORMAL LOW (ref >60)
Glucose, Bld: 219 mg/dL — ABNORMAL HIGH (ref 70–99)
Potassium: 4.7 mmol/L (ref 3.5–5.1)
Sodium: 136 mmol/L (ref 135–145)
Total Bilirubin: 0.5 mg/dL (ref 0.3–1.2)
Total Protein: 7.2 g/dL (ref 6.5–8.1)

## 2020-09-27 LAB — URINE CULTURE: Culture: NO GROWTH

## 2020-09-27 LAB — C-REACTIVE PROTEIN: CRP: 2 mg/dL — ABNORMAL HIGH (ref <1.0)

## 2020-09-27 LAB — MAGNESIUM: Magnesium: 2.3 mg/dL (ref 1.7–2.4)

## 2020-09-27 LAB — D-DIMER, QUANTITATIVE: D-Dimer, Quant: 0.65 ug/mL-FEU — ABNORMAL HIGH (ref 0.00–0.50)

## 2020-09-27 LAB — PHOSPHORUS: Phosphorus: 3.1 mg/dL (ref 2.5–4.6)

## 2020-09-27 NOTE — Progress Notes (Signed)
Inpatient Diabetes Program Recommendations  AACE/ADA: New Consensus Statement on Inpatient Glycemic Control (2015)  Target Ranges:  Prepandial:   less than 140 mg/dL      Peak postprandial:   less than 180 mg/dL (1-2 hours)      Critically ill patients:  140 - 180 mg/dL   Lab Results  Component Value Date   GLUCAP 226 (H) 09/27/2020   HGBA1C 7.4 (H) 08/01/2020    Review of Glycemic Control Results for ARMANDA, VIBERT (MRN [id number]) as of 09/27/2020 10:38  Ref. Range 09/26/2020 18:27 09/26/2020 20:54 09/27/2020 07:47  Glucose-Capillary Latest Ref Range: 70 - 99 mg/dL 214 (H) 210 (H) 226 (H)   Diabetes history: DM 2 Outpatient Diabetes medications: Metformin 500 mg bid Current orders for Inpatient glycemic control:  Novolog 0-9 units tid = hs  Solumedrol 125 mg Q24 hours A1c 7.4% on 6/6 Glucose trends 210-226  Inpatient Diabetes Program Recommendations:    -  Add Tradjenta 5 mg Daily -  Add Novolog 2 units tid meal coverage if eating >50% of meals  Thanks,  Tama Headings RN, MSN, BC-ADM Inpatient Diabetes Coordinator Team Pager [phone] (8a-5p)

## 2020-09-27 NOTE — Progress Notes (Signed)
PROGRESS NOTE    Kerry Powell  [id number] DOB: 08/23/1945 DOA: 09/25/2020 PCP: Binnie Rail, MD   Chief Complaint  Patient presents with   Weakness   Brief Narrative:   Kerry Powell is Kerry Powell 75 y.o. female with medical history significant for CVA, CAD, hypertension, GERD, COPD, IBS, type 2 diabetes, CKD who presents with concerns of worsening shortness of breath.  She's been admitted for COVID 19 virus infection with hypoxia.  Vaccinated with [employer] x2, but no booster.     Assessment & Plan:   Principal Problem:   Acute respiratory failure with hypoxia (HCC) Active Problems:   COPD (chronic obstructive pulmonary disease) (HCC)   History of CVA (cerebrovascular accident)   Essential hypertension, benign   Hyperlipidemia   AKI (acute kidney injury) ([place])  Acute Hypoxic Respiratory Failure  COVID 19 Virus Infection Continues to require 3-4 L  Wean as tolerated for O2 goal >88% CXR with emphysema and chronic bronchial thickening CT chest without acute cardiopulm dz (possible hypoxia related to COPD findings? Though her oxygen requirement is new) D dimer mildly elevated, follow LE [place] (pending) Remdesivir 8/1-present Solumedrol 8/1-present Discussed possibility of baricitinib/actemra if she were to decline.  Discussed general and specific risks (infection - vte/malignancy with baricitinib).  Would discuss and confirm if needed.   Strict I/O, daily weights IS, flutter, OOB, therapy  COVID-19 Labs  Recent Labs    09/26/20 0142 09/26/20 1931 09/27/20 0335  DDIMER 0.72*  --  0.65*  CRP  --  3.1* 2.0*    Lab Results  Component Value Date   SARSCOV2NAA POSITIVE (Wally Shevchenko) 09/25/2020   SARSCOV2NAA NOT DETECTED 03/26/2019   [place] Not Detected 02/17/2019   [place] Not Detected 11/11/2018   Elevated D dimer Mild and suspect this is 2/2 inflammation related to covid rather than VTE  Follow LE [place] (pending) Consider CT PE study (hold off given aki) vs VQ scan if  worsening   AKI on CKD IIIb Baseline creatinine appears to be around 1.5 1.75 on presentation ACEi being held Follow UA (bland) improved  COPD Not on baseline o2 Follow CT scan  Inhalers  Hypertension Continue metoprolol and amlodipine ACEi on hold  HLD Continue statin   Hx CVA Continue plavix   DVT prophylaxis: lovenox Code Status: full  Family Communication: none at bedside Disposition:   Status is: Inpatient  Remains inpatient appropriate because:Inpatient level of care appropriate due to severity of illness  Dispo: The patient is from: Home              Anticipated d/c is to: Home              Patient currently is not medically stable to d/c.   Difficult to place patient No       Consultants:  none  Procedures:  none  Antimicrobials:  Anti-infectives (From admission, onward)    Start     Dose/Rate Route Frequency Ordered Stop   09/27/20 1000  remdesivir 100 mg in sodium chloride 0.9 % 100 mL IVPB       See Hyperspace for full Linked Orders Report.   100 mg 200 mL/hr over 30 Minutes Intravenous Daily 09/26/20 0019 10/01/20 0959   09/26/20 0130  remdesivir 100 mg in sodium chloride 0.9 % 100 mL IVPB       See Hyperspace for full Linked Orders Report.   100 mg 200 mL/hr over 30 Minutes Intravenous  Once 09/26/20 0019 09/26/20 0310   09/26/20 0045  remdesivir 100 mg in sodium chloride 0.9 % 100 mL IVPB       See Hyperspace for full Linked Orders Report.   100 mg 200 mL/hr over 30 Minutes Intravenous  Once 09/26/20 0019 09/26/20 0235          Subjective: Feeling Kerry Powell little better  Objective: Vitals:   09/27/20 1000 09/27/20 1011 09/27/20 1014 09/27/20 1300  BP: 123/71   123/72  Pulse: 63   (!) 59  Resp: 16   18  Temp: 98 F (36.7 C)   97.7 F (36.5 C)  TempSrc: Oral   Oral  SpO2: (!) 89% 90% 94% 93%  Weight:      Height:        Intake/Output Summary (Last 24 hours) at 09/27/2020 1521 Last data filed at 09/27/2020 1300 Gross per 24  hour  Intake 480 ml  Output 1300 ml  Net -820 ml   Filed Weights   09/25/20 2112  Weight: 101.2 kg    Examination:  General: No acute distress. Cardiovascular: RRR Lungs: unlabored Abdomen: Soft, nontender, nondistended  Neurological: Alert and oriented 3. Moves all extremities 4. Cranial nerves II through XII grossly intact. Skin: Warm and dry. No rashes or lesions. Extremities: No clubbing or cyanosis. No edema.   Data Reviewed: I have personally reviewed following labs and imaging studies  CBC: Recent Labs  Lab 09/25/20 2233 09/27/20 0335  WBC 4.2 4.1  NEUTROABS 2.6 2.8  HGB 14.7 14.5  HCT 45.4 45.4  MCV 96.8 97.6  PLT 235 [id number]    Basic Metabolic Panel: Recent Labs  Lab 09/25/20 2233 09/27/20 0335  NA 133* 136  K 4.6 4.7  CL 99 100  CO2 26 25  GLUCOSE 136* 219*  BUN 19 23  CREATININE 1.75* 1.51*  CALCIUM 8.4* 8.6*  MG  --  2.3  PHOS  --  3.1    GFR: Estimated Creatinine Clearance: 38 mL/min (Jamae Tison) (by C-G formula based on SCr of 1.51 mg/dL (H)).  Liver Function Tests: Recent Labs  Lab 09/25/20 2233 09/27/20 0335  AST 22 27  ALT 17 20  ALKPHOS 82 75  BILITOT 0.6 0.5  PROT 7.3 7.2  ALBUMIN 3.8 3.9    CBG: Recent Labs  Lab 09/26/20 1738 09/26/20 1827 09/26/20 2054 09/27/20 0747 09/27/20 1126  GLUCAP 211* 214* 210* 226* 228*     Recent Results (from the past 240 hour(s))  Urine Culture     Status: None   Collection Time: 09/25/20 10:00 PM   Specimen: Urine, Clean Catch  Result Value Ref Range Status   Specimen Description   Final    URINE, CLEAN CATCH Performed at [facility], [place] [address], [place], [place] [address]    Special Requests   Final    NONE Performed at [facility], [place] [address], [place], [place] [address]    Culture   Final    NO GROWTH Performed at [facility], [place] [address], [place], [place] [address]    Report Status 09/27/2020 FINAL  Final  Resp Panel by  RT-PCR (Flu Jessee Mezera&B, Covid) Nasopharyngeal Swab     Status: Abnormal   Collection Time: 09/25/20 10:00 PM   Specimen: Nasopharyngeal Swab; Nasopharyngeal(NP) swabs in vial transport medium  Result Value Ref Range Status   SARS Coronavirus 2 by RT PCR POSITIVE (Nakyra Bourn) NEGATIVE Final    Comment: RESULT CALLED TO, READ BACK BY AND VERIFIED WITH: NATASHA WHEATLEY AT 0009 ON 09/26/20  BY MAJ (NOTE) SARS-CoV-2 target nucleic acids are DETECTED.  The SARS-CoV-2 RNA is generally detectable in upper respiratory specimens during the acute phase of infection. Positive results are indicative of the presence of the identified virus, but do not rule out bacterial infection or co-infection with other pathogens not detected by the test. Clinical correlation with patient history and other diagnostic information is necessary to determine patient infection status. The expected result is Negative.  Fact Sheet for Patients: EntrepreneurPulse.com.au  Fact Sheet for Healthcare Providers: IncredibleEmployment.be  This test is not yet approved or cleared by the [place] FDA and  has been authorized for detection and/or diagnosis of SARS-CoV-2 by FDA under an Emergency Use Authorization (EUA).  This EUA will remain in effect (meaning this t est can be used) for the duration of  the COVID-19 declaration under Section 564(b)(1) of the Act, 21 U.S.C. section 360bbb-3(b)(1), unless the authorization is terminated or revoked sooner.     Influenza Toby Breithaupt by PCR NEGATIVE NEGATIVE Final   Influenza B by PCR NEGATIVE NEGATIVE Final    Comment: (NOTE) The Xpert Xpress SARS-CoV-2/FLU/RSV plus assay is intended as an aid in the diagnosis of influenza from Nasopharyngeal swab specimens and should not be used as Ravan Schlemmer sole basis for treatment. Nasal washings and aspirates are unacceptable for Xpert Xpress SARS-CoV-2/FLU/RSV testing.  Fact Sheet for  Patients: EntrepreneurPulse.com.au  Fact Sheet for Healthcare Providers: IncredibleEmployment.be  This test is not yet approved or cleared by the [place] FDA and has been authorized for detection and/or diagnosis of SARS-CoV-2 by FDA under an Emergency Use Authorization (EUA). This EUA will remain in effect (meaning this test can be used) for the duration of the COVID-19 declaration under Section 564(b)(1) of the Act, 21 U.S.C. section 360bbb-3(b)(1), unless the authorization is terminated or revoked.  Performed at [facility], [place] [address] [place], [place] [address]          Radiology Studies: CT CHEST WO CONTRAST  Result Date: 09/26/2020 CLINICAL DATA:  Respiratory failure.  COVID.  Shortness of breath. EXAM: CT CHEST WITHOUT CONTRAST TECHNIQUE: Multidetector CT imaging of the chest was performed following the standard protocol without IV contrast. COMPARISON:  05/06/2020 FINDINGS: Cardiovascular: Coronary artery and aortic atherosclerosis. Heart is normal size. No aneurysm. Mediastinum/Nodes: Small scattered mediastinal lymph nodes, likely reactive. No mediastinal, hilar, or axillary adenopathy. Thyroid unremarkable. Trachea and esophagus are unremarkable. Moderate emphysema Lungs/Pleura: Moderate emphysema. Areas of scarring in the right mid lung and both lower lobes. No effusions. Upper Abdomen: Imaging into the upper abdomen demonstrates no acute findings. Musculoskeletal: Chest wall soft tissues are unremarkable. No acute bony abnormality. IMPRESSION: No acute cardiopulmonary disease. Coronary artery disease. Aortic Atherosclerosis (ICD10-I70.0) and Emphysema (ICD10-J43.9). Electronically Signed   By: Rolm Baptise M.D.   On: 09/26/2020 09:51   DG Chest Port 1 View  Result Date: 09/25/2020 CLINICAL DATA:  Hypoxia.  Weakness. EXAM: PORTABLE CHEST 1 VIEW COMPARISON:  03/26/2019, CT 05/06/2020 FINDINGS: Lungs are  hyperinflated with emphysema. Chronic bronchial thickening. Normal heart size. Stable mediastinal contours with aortic atherosclerosis. No focal airspace disease. No pleural effusion or pneumothorax. No acute osseous abnormalities are seen. IMPRESSION: Emphysema and chronic bronchial thickening. No superimposed acute process. Electronically Signed   By: Keith Rake M.D.   On: 09/25/2020 22:41        Scheduled Meds:  allopurinol  300 mg Oral Daily   amLODipine  7.5 mg Oral Daily   clopidogrel  75 mg Oral Daily  DULoxetine  90 mg Oral Daily   enoxaparin (LOVENOX) injection  40 mg Subcutaneous Q24H   ferrous sulfate  325 mg Oral Q breakfast   fluticasone  2 spray Each Nare Daily   insulin aspart  0-5 Units Subcutaneous QHS   insulin aspart  0-9 Units Subcutaneous TID WC   methylPREDNISolone (SOLU-MEDROL) injection  125 mg Intravenous Q24H   metoprolol succinate  50 mg Oral Daily   pantoprazole  40 mg Oral BID AC   rosuvastatin  40 mg Oral Daily   umeclidinium-vilanterol  1 puff Inhalation Daily   Continuous Infusions:  remdesivir 100 mg in NS 100 mL 100 mg (09/27/20 0834)     LOS: 1 day    Time spent: over 30 min    Fayrene Helper, MD [facility]   To contact the attending provider between 7A-7P or the covering provider during after hours 7P-7A, please log into the web site [email or web address] and access using universal [place] password for that web site. If you do not have the password, please call the hospital operator.  09/27/2020, 3:21 PM

## 2020-09-27 NOTE — TOC Progression Note (Signed)
Transition of Care [facility]) - Progression Note    Patient Details  Name: Kerry Powell MRN: [id number] Date of Birth: 28-Nov-1945  Transition of Care [facility]) CM/SW Contact  Purcell Mouton, RN Phone Number: 09/27/2020, 1:23 PM  Clinical Narrative:     Pt from home with husband and adult children. Pt plan to discharge home with no needs.   Expected Discharge Plan: Home/Self Care Barriers to Discharge: No Barriers Identified  Expected Discharge Plan and Services Expected Discharge Plan: Home/Self Care       Living arrangements for the past 2 months: Single Family Home                                       Social Determinants of Health (SDOH) Interventions    Readmission Risk Interventions No flowsheet data found.

## 2020-09-27 NOTE — Progress Notes (Signed)
BLE venous duplex has been completed.  Results can be found under chart review under CV PROC. 09/27/2020 4:35 PM Celester Morgan RVT, RDMS

## 2020-09-27 NOTE — Evaluation (Signed)
Physical Therapy Evaluation Patient Details Name: Kerry Powell MRN: [id number] DOB: 21-Nov-1945 Today's Date: 09/27/2020   History of Present Illness  Kerry Powell is a 75 y.o. female with medical history significant for CVA, CAD, hypertension, GERD, COPD, IBS, type 2 diabetes, CKD who presents with concerns of worsening shortness of breath.     She's been admitted for COVID 19 virus infection with hypoxia  Clinical Impression  The Patient reports  feeling tired and wanting sleep. The patient's SPO2 on 2 L 95% RA 87 % ambulating on RA. Patient  should progress to return  home. Patient reports's spouse recovering from CEA.  Pt admitted with above diagnosis.  Pt currently with functional limitations due to the deficits listed below (see PT Problem List). Pt will benefit from skilled PT to increase their independence and safety with mobility to allow discharge to the venue listed below.       Follow Up Recommendations No PT follow up    Equipment Recommendations  None recommended by PT;Rolling walker with 5" wheels    Recommendations for Other Services       Precautions / Restrictions Precautions Precautions: Fall      Mobility  Bed Mobility Overal bed mobility: Needs Assistance Bed Mobility: Supine to Sit     Supine to sit: Supervision          Transfers Overall transfer level: Needs assistance Equipment used: None Transfers: Sit to/from Stand Sit to Stand: Min guard         General transfer comment: stands but guardedly  Ambulation/Gait Ambulation/Gait assistance: Min assist;Min guard Gait Distance (Feet): 40 Feet Assistive device: None Gait Pattern/deviations: Decreased stride length     General Gait Details: patient ambulates slowly, tends to hold onto bed and reaches for objects  Stairs            Wheelchair Mobility    Modified Rankin (Stroke Patients Only)       Balance Overall balance assessment: Needs assistance Sitting-balance support:  No upper extremity supported;Feet supported Sitting balance-Leahy Scale: Normal     Standing balance support: Single extremity supported Standing balance-Leahy Scale: Fair Standing balance comment: static is  good but during ambulation  requires occassionaL  support                             Pertinent Vitals/Pain Pain Assessment: Faces Faces Pain Scale: No hurt    Home Living Family/patient expects to be discharged to:: Private residence Living Arrangements: Spouse/significant other;Other relatives Available Help at Discharge: Family Type of Home: House Home Access: Stairs to enter Entrance Stairs-Rails: None Entrance Stairs-Number of Steps: 3 Home Layout: Multi-level;Able to live on main level with bedroom/bathroom Home Equipment: None      Prior Function Level of Independence: Independent               Hand Dominance   Dominant Hand: Right    Extremity/Trunk Assessment   Upper Extremity Assessment Upper Extremity Assessment: Overall WFL for tasks assessed    Lower Extremity Assessment Lower Extremity Assessment: Generalized weakness    Cervical / Trunk Assessment Cervical / Trunk Assessment: Normal  Communication   Communication: No difficulties  Cognition Arousal/Alertness: Awake/alert Behavior During Therapy: WFL for tasks assessed/performed Overall Cognitive Status: Within Functional Limits for tasks assessed  General Comments      Exercises     Assessment/Plan    PT Assessment Patient needs continued PT services  PT Problem List Decreased strength;Decreased balance;Decreased mobility;Decreased activity tolerance;Decreased safety awareness       PT Treatment Interventions DME instruction;Therapeutic activities;Gait training;Therapeutic exercise;Patient/family education;Functional mobility training    PT Goals (Current goals can be found in the Care Plan section)  Acute  Rehab PT Goals Patient Stated Goal: to feel better and go home. PT Goal Formulation: With patient Time For Goal Achievement: 10/11/20 Potential to Achieve Goals: Good    Frequency Min 3X/week   Barriers to discharge        Co-evaluation               AM-PAC PT "6 Clicks" Mobility  Outcome Measure Help needed turning from your back to your side while in a flat bed without using bedrails?: None Help needed moving from lying on your back to sitting on the side of a flat bed without using bedrails?: None Help needed moving to and from a bed to a chair (including a wheelchair)?: A Little Help needed standing up from a chair using your arms (e.g., wheelchair or bedside chair)?: A Little Help needed to walk in hospital room?: A Little Help needed climbing 3-5 steps with a railing? : A Little 6 Click Score: 20    End of Session Equipment Utilized During Treatment: Gait belt Activity Tolerance: Patient tolerated treatment well Patient left: in chair;with call bell/phone within reach;with chair alarm set Nurse Communication: Mobility status PT Visit Diagnosis: Unsteadiness on feet (R26.81);Difficulty in walking, not elsewhere classified (R26.2)    Time: [id number] PT Time Calculation (min) (ACUTE ONLY): 24 min   Charges:   PT Evaluation $PT Eval Low Complexity: 1 Low PT Treatments $Gait Training: 8-22 mins        Tresa Endo PT Acute Rehabilitation Services Pager [phone] Office [phone]   Claretha Cooper 09/27/2020, 1:13 PM

## 2020-09-28 LAB — PHOSPHORUS: Phosphorus: 2.8 mg/dL (ref 2.5–4.6)

## 2020-09-28 LAB — CBC WITH DIFFERENTIAL/PLATELET
Abs Immature Granulocytes: 0.03 10*3/uL (ref 0.00–0.07)
Basophils Absolute: 0 10*3/uL (ref 0.0–0.1)
Basophils Relative: 0 %
Eosinophils Absolute: 0 10*3/uL (ref 0.0–0.5)
Eosinophils Relative: 0 %
HCT: 41.7 % (ref 36.0–46.0)
Hemoglobin: 13.4 g/dL (ref 12.0–15.0)
Immature Granulocytes: 1 %
Lymphocytes Relative: 14 %
Lymphs Abs: 0.9 10*3/uL (ref 0.7–4.0)
MCH: 31.5 pg (ref 26.0–34.0)
MCHC: 32.1 g/dL (ref 30.0–36.0)
MCV: 97.9 fL (ref 80.0–100.0)
Monocytes Absolute: 0.2 10*3/uL (ref 0.1–1.0)
Monocytes Relative: 3 %
Neutro Abs: 5.2 10*3/uL (ref 1.7–7.7)
Neutrophils Relative %: 82 %
Platelets: 245 10*3/uL (ref 150–400)
RBC: 4.26 MIL/uL (ref 3.87–5.11)
RDW: 13.6 % (ref 11.5–15.5)
WBC: 6.2 10*3/uL (ref 4.0–10.5)
nRBC: 0 % (ref 0.0–0.2)

## 2020-09-28 LAB — COMPREHENSIVE METABOLIC PANEL
ALT: 23 U/L (ref 0–44)
AST: 28 U/L (ref 15–41)
Albumin: 3.4 g/dL — ABNORMAL LOW (ref 3.5–5.0)
Alkaline Phosphatase: 68 U/L (ref 38–126)
Anion gap: 6 (ref 5–15)
BUN: 26 mg/dL — ABNORMAL HIGH (ref 8–23)
CO2: 23 mmol/L (ref 22–32)
Calcium: 8.1 mg/dL — ABNORMAL LOW (ref 8.9–10.3)
Chloride: 103 mmol/L (ref 98–111)
Creatinine, Ser: 1.51 mg/dL — ABNORMAL HIGH (ref 0.44–1.00)
GFR, Estimated: 36 mL/min — ABNORMAL LOW (ref >60)
Glucose, Bld: 234 mg/dL — ABNORMAL HIGH (ref 70–99)
Potassium: 4.8 mmol/L (ref 3.5–5.1)
Sodium: 132 mmol/L — ABNORMAL LOW (ref 135–145)
Total Bilirubin: 0.5 mg/dL (ref 0.3–1.2)
Total Protein: 6.4 g/dL — ABNORMAL LOW (ref 6.5–8.1)

## 2020-09-28 LAB — GLUCOSE, CAPILLARY
Glucose-Capillary: 236 mg/dL — ABNORMAL HIGH (ref 70–99)
Glucose-Capillary: 258 mg/dL — ABNORMAL HIGH (ref 70–99)
Glucose-Capillary: 235 mg/dL — ABNORMAL HIGH (ref 70–99)
Glucose-Capillary: 170 mg/dL — ABNORMAL HIGH (ref 70–99)

## 2020-09-28 LAB — MAGNESIUM: Magnesium: 2.3 mg/dL (ref 1.7–2.4)

## 2020-09-28 MED ORDER — INSULIN GLARGINE-YFGN 100 UNIT/ML ~~LOC~~ SOLN
10.0000 [IU] | Freq: Every day | SUBCUTANEOUS | Status: DC
  Administered 2020-09-28 – 2020-09-29 (×2): 10 [IU] via SUBCUTANEOUS
  Filled 2020-09-28 (×2): qty 0.1

## 2020-09-28 NOTE — Plan of Care (Signed)
  Problem: Education: Goal: Knowledge of General Education information will improve Description: Including pain rating scale, medication(s)/side effects and non-pharmacologic comfort measures Outcome: Progressing   Problem: Clinical Measurements: Goal: Will remain free from infection Outcome: Progressing Goal: Diagnostic test results will improve Outcome: Progressing Goal: Respiratory complications will improve Outcome: Progressing   

## 2020-09-28 NOTE — Evaluation (Signed)
Occupational Therapy Evaluation Patient Details Name: Kerry Powell MRN: [id number] DOB: May 13, 1945 Today's Date: 09/28/2020    History of Present Illness Kerry Powell is a 75 y.o. female with medical history significant for CVA, CAD, hypertension, GERD, COPD, IBS, type 2 diabetes, CKD who presents with concerns of worsening shortness of breath.     She's been admitted for COVID 19 virus infection with hypoxia   Clinical Impression   Patient lives at home with spouse and son, is independent at baseline and has family available 24/7 for any assist needed. Patient demonstrates independence with bed mobility, ambulation to/from bathroom without AD, standing sink side for g/h and then transfer to recliner. No loss of balance noted. Patient note mild dyspnea in standing without O2, reading 87% once seated in recliner on room air. With cues for pursed lip breathing patient unable to sustain above 90% on room air therefore donned 1L with patient maintaining 94-95%. Educate patient on importance of daily mobility, sitting up in chair and incentive spirometer use. Patient does not currently have IS in her room, notified nursing who states will bring one, also informed RN of O2 sats with activity. All OT education completed, no further acute needs. Will sign off. Please re-consult if new needs arise.    Follow Up Recommendations  No OT follow up    Equipment Recommendations  Tub/shower seat       Precautions / Restrictions Precautions Precaution Comments: monitor O2 Restrictions Weight Bearing Restrictions: No      Mobility Bed Mobility Overal bed mobility: Modified Independent       Supine to sit: HOB elevated          Transfers Overall transfer level: Independent Equipment used: None                  Balance Overall balance assessment: No apparent balance deficits (not formally assessed)                                         ADL either performed or  assessed with clinical judgement   ADL Overall ADL's : Independent;At baseline                                       General ADL Comments: patient is able to don socks, ambulate to/from bathroom, complete grooming/hygiene task and transfer to recliner chair all without any physical assistance or loss of balance noted. Educated patient on importance of mobilizing throughout the day including sitting up to chair. Patient agreeable to this.      Pertinent Vitals/Pain Pain Assessment: No/denies pain     Hand Dominance Right   Extremity/Trunk Assessment Upper Extremity Assessment Upper Extremity Assessment: Overall WFL for tasks assessed       Cervical / Trunk Assessment Cervical / Trunk Assessment: Normal   Communication Communication Communication: No difficulties   Cognition Arousal/Alertness: Awake/alert Behavior During Therapy: WFL for tasks assessed/performed Overall Cognitive Status: Within Functional Limits for tasks assessed                                     General Comments  Patient ambulate to/from bathroom without O2, note O2 reading 87% initially but with seated rest patient fluctuating between  89-93%. Donned 1L O2 while seated in recliner and able to maintain 94-95%. HR stable throughout. Also notified RN weaned O2 from 3L to 1L.    Exercises Exercises: Other exercises Other Exercises Other Exercises: educate patient on use of incentive spirometer however cannot find in room, notified RN who states she will bring patient one.   Shoulder Instructions      Home Living Family/patient expects to be discharged to:: Private residence Living Arrangements: Spouse/significant other;Other relatives (son) Available Help at Discharge: Family;Available 24 hours/day Type of Home: House Home Access: Stairs to enter [employer] of Steps: 3 Entrance Stairs-Rails: None Home Layout: Multi-level;Able to live on main level with  bedroom/bathroom     Bathroom Shower/Tub: Occupational psychologist: Handicapped height     Home Equipment: None          Prior Functioning/Environment Level of Independence: Independent                 OT Problem List: Decreased activity tolerance;Cardiopulmonary status limiting activity         OT Goals(Current goals can be found in the care plan section) Acute Rehab OT Goals Patient Stated Goal: hopefully home friday OT Goal Formulation: All assessment and education complete, DC therapy   AM-PAC OT "6 Clicks" Daily Activity     Outcome Measure Help from another person eating meals?: None Help from another person taking care of personal grooming?: None Help from another person toileting, which includes using toliet, bedpan, or urinal?: None Help from another person bathing (including washing, rinsing, drying)?: None Help from another person to put on and taking off regular upper body clothing?: None Help from another person to put on and taking off regular lower body clothing?: None 6 Click Score: 24   End of Session Equipment Utilized During Treatment: Oxygen Nurse Communication: Mobility status;Other (comment) (saturations)  Activity Tolerance: Patient tolerated treatment well Patient left: in chair;with call bell/phone within reach  OT Visit Diagnosis: Other abnormalities of gait and mobility (R26.89)                Time: [id number] OT Time Calculation (min): 23 min Charges:  OT General Charges $OT Visit: 1 Visit OT Evaluation $OT Eval Low Complexity: 1 Low OT Treatments $Therapeutic Activity: 8-22 mins  Delbert Phenix OT OT pager: [place] 09/28/2020, 2:11 PM

## 2020-09-28 NOTE — Progress Notes (Signed)
PROGRESS NOTE    Kerry Powell  [id number] DOB: 11-08-1945 DOA: 09/25/2020 PCP: Binnie Rail, MD   Chief Complaint  Patient presents with   Weakness   Brief Narrative:  Kerry Powell is a 75 y.o. female with medical history significant for CVA, CAD, hypertension, GERD, COPD, IBS, type 2 diabetes, CKD who presents with concerns of worsening shortness of breath.  She's been admitted for COVID 19 virus infection with hypoxia.  Vaccinated with [employer] x2, but no booster.     Assessment & Plan:   Principal Problem:   Acute respiratory failure with hypoxia (HCC) Active Problems:   COPD (chronic obstructive pulmonary disease) (HCC)   History of CVA (cerebrovascular accident)   Essential hypertension, benign   Hyperlipidemia   AKI (acute kidney injury) ([place])  Acute Hypoxic Respiratory Failure  COVID 19 Virus Infection Continues to require 3-4 L [place] as tolerated for O2 goal >88% CXR with emphysema and chronic bronchial thickening CT chest without acute cardiopulm dz, Though her oxygen requirement is new) D dimer mildly elevated, Doppler lower extremity negative for DVT.  Remdesivir 8/1-present Solumedrol 8/1-present Discussed possibility of baricitinib/actemra if she were to decline.  Discussed general and specific risks (infection - vte/malignancy with baricitinib).  Would discuss and confirm if needed.   Strict I/O, daily weights Patient was encouraged to prone, out of bed to chair, to use incentive spirometry and flutter valve.   COVID-19 Labs  Recent Labs    09/26/20 0142 09/26/20 1931 09/27/20 0335  DDIMER 0.72*  --  0.65*  CRP  --  3.1* 2.0*     Lab Results  Component Value Date   SARSCOV2NAA POSITIVE (A) 09/25/2020   SARSCOV2NAA NOT DETECTED 03/26/2019   [place] Not Detected 02/17/2019   [place] Not Detected 11/11/2018    AKI on CKD IIIb: Creatinine back to baseline which is around 1.5.  COPD: Not in exacerbation.  Continue current  management.  Hypertension: Controlled.  Continue metoprolol and amlodipine.  ACE inhibitor is on hold.  HLD Continue statin   Hx CVA Continue plavix   DVT prophylaxis: lovenox Code Status: full  Family Communication: none at bedside Disposition:   Status is: Inpatient  Remains inpatient appropriate because:Inpatient level of care appropriate due to severity of illness  Dispo: The patient is from: Home              Anticipated d/c is to: Home              Patient currently is not medically stable to d/c.   Difficult to place patient No       Consultants:  none  Procedures:  none  Antimicrobials:  Anti-infectives (From admission, onward)    Start     Dose/Rate Route Frequency Ordered Stop   09/27/20 1000  remdesivir 100 mg in sodium chloride 0.9 % 100 mL IVPB       See Hyperspace for full Linked Orders Report.   100 mg 200 mL/hr over 30 Minutes Intravenous Daily 09/26/20 0019 10/01/20 0959   09/26/20 0130  remdesivir 100 mg in sodium chloride 0.9 % 100 mL IVPB       See Hyperspace for full Linked Orders Report.   100 mg 200 mL/hr over 30 Minutes Intravenous  Once 09/26/20 0019 09/26/20 0310   09/26/20 0045  remdesivir 100 mg in sodium chloride 0.9 % 100 mL IVPB       See Hyperspace for full Linked Orders Report.   100 mg 200  mL/hr over 30 Minutes Intravenous  Once 09/26/20 0019 09/26/20 0235          Subjective: Seen and examined.  She feels better overall.  Still on 3 L oxygen.  No shortness of breath.  Objective: Vitals:   09/27/20 1300 09/27/20 2104 09/28/20 0503 09/28/20 0544  BP: 123/72 127/67 134/72   Pulse: (!) 59 (!) 59 72   Resp: '18 20 20   '$ Temp: 97.7 F (36.5 C) 98.6 F (37 C) 98.1 F (36.7 C)   TempSrc: Oral Oral Oral   SpO2: 93% 92% (!) 89% 90%  Weight:    102.8 kg  Height:        Intake/Output Summary (Last 24 hours) at 09/28/2020 1152 Last data filed at 09/27/2020 2200 Gross per 24 hour  Intake 480 ml  Output --  Net 480 ml     Filed Weights   09/25/20 2112 09/28/20 0544  Weight: 101.2 kg 102.8 kg    Examination:  General exam: Appears calm and comfortable  Respiratory system: Clear to auscultation. Respiratory effort normal. Cardiovascular system: S1 & S2 heard, RRR. No JVD, murmurs, rubs, gallops or clicks. No pedal edema. Gastrointestinal system: Abdomen is nondistended, soft and nontender. No organomegaly or masses felt. Normal bowel sounds heard. Central nervous system: Alert and oriented. No focal neurological deficits. Extremities: Symmetric 5 x 5 power. Skin: No rashes, lesions or ulcers.  Psychiatry: Judgement and insight appear normal. Mood & affect appropriate.    Data Reviewed: I have personally reviewed following labs and imaging studies  CBC: Recent Labs  Lab 09/25/20 2233 09/27/20 0335 09/28/20 0342  WBC 4.2 4.1 6.2  NEUTROABS 2.6 2.8 5.2  HGB 14.7 14.5 13.4  HCT 45.4 45.4 41.7  MCV 96.8 97.6 97.9  PLT 235 266 245     Basic Metabolic Panel: Recent Labs  Lab 09/25/20 2233 09/27/20 0335 09/28/20 0342  NA 133* 136 132*  K 4.6 4.7 4.8  CL 99 100 103  CO2 '26 25 23  '$ GLUCOSE 136* 219* 234*  BUN 19 23 26*  CREATININE 1.75* 1.51* 1.51*  CALCIUM 8.4* 8.6* 8.1*  MG  --  2.3 2.3  PHOS  --  3.1 2.8     GFR: Estimated Creatinine Clearance: 38.3 mL/min (A) (by C-G formula based on SCr of 1.51 mg/dL (H)).  Liver Function Tests: Recent Labs  Lab 09/25/20 2233 09/27/20 0335 09/28/20 0342  AST '22 27 28  '$ ALT '17 20 23  '$ ALKPHOS 82 75 68  BILITOT 0.6 0.5 0.5  PROT 7.3 7.2 6.4*  ALBUMIN 3.8 3.9 3.4*     CBG: Recent Labs  Lab 09/27/20 0747 09/27/20 1126 09/27/20 1646 09/27/20 2101 09/28/20 0750  GLUCAP 226* 228* 252* 190* 236*      Recent Results (from the past 240 hour(s))  Urine Culture     Status: None   Collection Time: 09/25/20 10:00 PM   Specimen: Urine, Clean Catch  Result Value Ref Range Status   Specimen Description   Final    URINE, CLEAN  CATCH Performed at [facility], [place] [address] [place], [place] [address]    Special Requests   Final    NONE Performed at [facility], [place] [address] [place], [place] [address]    Culture   Final    NO GROWTH Performed at [facility], [place] [address], [place], [place] [address]    Report Status 09/27/2020 FINAL  Final  Resp Panel by RT-PCR (  Flu A&B, Covid) Nasopharyngeal Swab     Status: Abnormal   Collection Time: 09/25/20 10:00 PM   Specimen: Nasopharyngeal Swab; Nasopharyngeal(NP) swabs in vial transport medium  Result Value Ref Range Status   SARS Coronavirus 2 by RT PCR POSITIVE (A) NEGATIVE Final    Comment: RESULT CALLED TO, READ BACK BY AND VERIFIED WITH: NATASHA WHEATLEY AT 0009 ON 09/26/20 BY MAJ (NOTE) SARS-CoV-2 target nucleic acids are DETECTED.  The SARS-CoV-2 RNA is generally detectable in upper respiratory specimens during the acute phase of infection. Positive results are indicative of the presence of the identified virus, but do not rule out bacterial infection or co-infection with other pathogens not detected by the test. Clinical correlation with patient history and other diagnostic information is necessary to determine patient infection status. The expected result is Negative.  Fact Sheet for Patients: EntrepreneurPulse.com.au  Fact Sheet for Healthcare Providers: IncredibleEmployment.be  This test is not yet approved or cleared by the [place] FDA and  has been authorized for detection and/or diagnosis of SARS-CoV-2 by FDA under an Emergency Use Authorization (EUA).  This EUA will remain in effect (meaning this t est can be used) for the duration of  the COVID-19 declaration under Section 564(b)(1) of the Act, 21 U.S.C. section 360bbb-3(b)(1), unless the authorization is terminated or revoked sooner.     Influenza A by PCR NEGATIVE NEGATIVE Final   Influenza B  by PCR NEGATIVE NEGATIVE Final    Comment: (NOTE) The Xpert Xpress SARS-CoV-2/FLU/RSV plus assay is intended as an aid in the diagnosis of influenza from Nasopharyngeal swab specimens and should not be used as a sole basis for treatment. Nasal washings and aspirates are unacceptable for Xpert Xpress SARS-CoV-2/FLU/RSV testing.  Fact Sheet for Patients: EntrepreneurPulse.com.au  Fact Sheet for Healthcare Providers: IncredibleEmployment.be  This test is not yet approved or cleared by the [place] FDA and has been authorized for detection and/or diagnosis of SARS-CoV-2 by FDA under an Emergency Use Authorization (EUA). This EUA will remain in effect (meaning this test can be used) for the duration of the COVID-19 declaration under Section 564(b)(1) of the Act, 21 U.S.C. section 360bbb-3(b)(1), unless the authorization is terminated or revoked.  Performed at [facility], [place] [address] [place], [place] [address]           Radiology Studies: VAS [place] LOWER EXTREMITY VENOUS (DVT)  Result Date: 09/27/2020  Lower Venous DVT Study Patient Name:  KENNEDEY ZEMBOWER  Date of Exam:   09/27/2020 Medical Rec #: [id number]       Accession #:    [id number] Date of Birth: 10-Mar-1945       Patient Gender: F Patient Age:   84Y Exam Location:  [facility] Procedure:      VAS [place] LOWER EXTREMITY VENOUS (DVT) Referring Phys: [id number] A CALDWELL POWELL JR --------------------------------------------------------------------------------  Indications: Elevated D-dimer & COVID+.  Limitations: Body habitus, poor ultrasound/tissue interface and Patient inability to cooperate due to pain. Comparison Study: Previous exam 05/13/14 - negative Performing Technologist: Rogelia Rohrer RVT, RDMS  Examination Guidelines: A complete evaluation includes B-mode imaging, spectral Doppler, color Doppler, and power Doppler as needed of all accessible portions of each vessel.  Bilateral testing is considered an integral part of a complete examination. Limited examinations for reoccurring indications may be performed as noted. The reflux portion of the exam is performed with the patient in reverse Trendelenburg.  +---------+---------------+---------+-----------+----------+--------------+ RIGHT    CompressibilityPhasicitySpontaneityPropertiesThrombus Aging +---------+---------------+---------+-----------+----------+--------------+ CFV  Full           Yes      Yes                                 +---------+---------------+---------+-----------+----------+--------------+ SFJ      Full                                                        +---------+---------------+---------+-----------+----------+--------------+ FV Prox  Full           Yes      Yes                                 +---------+---------------+---------+-----------+----------+--------------+ FV Mid   Full           Yes      Yes                                 +---------+---------------+---------+-----------+----------+--------------+ FV DistalFull           Yes      Yes                                 +---------+---------------+---------+-----------+----------+--------------+ PFV      Full                                                        +---------+---------------+---------+-----------+----------+--------------+ POP      Full           Yes      Yes                                 +---------+---------------+---------+-----------+----------+--------------+ PTV      Full                                                        +---------+---------------+---------+-----------+----------+--------------+ PERO     Full                                                        +---------+---------------+---------+-----------+----------+--------------+   +---------+---------------+---------+-----------+----------+-------------------+ LEFT      CompressibilityPhasicitySpontaneityPropertiesThrombus Aging      +---------+---------------+---------+-----------+----------+-------------------+ CFV      Full           Yes      Yes                                      +---------+---------------+---------+-----------+----------+-------------------+ SFJ  Full                                                             +---------+---------------+---------+-----------+----------+-------------------+ FV Prox  Full           Yes      Yes                                      +---------+---------------+---------+-----------+----------+-------------------+ FV Mid                  Yes      Yes                  Patent by color &                                                         doppler             +---------+---------------+---------+-----------+----------+-------------------+ FV Distal               Yes      Yes                  Patent by color &                                                         doppler             +---------+---------------+---------+-----------+----------+-------------------+ PFV      Full                                                             +---------+---------------+---------+-----------+----------+-------------------+ POP      Full           Yes      Yes                                      +---------+---------------+---------+-----------+----------+-------------------+ PTV      Full                                                             +---------+---------------+---------+-----------+----------+-------------------+ PERO     Full                                                             +---------+---------------+---------+-----------+----------+-------------------+  Patient unable to tolerate compressions due to pain - refused.    Summary: BILATERAL: - No evidence of deep vein thrombosis seen in the lower extremities, bilaterally. - No  evidence of superficial venous thrombosis in the lower extremities, bilaterally. -No evidence of popliteal cyst, bilaterally.  LEFT: - Portions of this examination were limited- see technologist comments above.  *See table(s) above for measurements and observations. Electronically signed by Deitra Mayo MD on 09/27/2020 at 5:01:03 PM.    Final         Scheduled Meds:  allopurinol  300 mg Oral Daily   amLODipine  7.5 mg Oral Daily   clopidogrel  75 mg Oral Daily   DULoxetine  90 mg Oral Daily   enoxaparin (LOVENOX) injection  40 mg Subcutaneous Q24H   ferrous sulfate  325 mg Oral Q breakfast   fluticasone  2 spray Each Nare Daily   insulin aspart  0-5 Units Subcutaneous QHS   insulin aspart  0-9 Units Subcutaneous TID WC   insulin glargine-yfgn  10 Units Subcutaneous Daily   methylPREDNISolone (SOLU-MEDROL) injection  125 mg Intravenous Q24H   metoprolol succinate  50 mg Oral Daily   pantoprazole  40 mg Oral BID AC   rosuvastatin  40 mg Oral Daily   umeclidinium-vilanterol  1 puff Inhalation Daily   Continuous Infusions:  remdesivir 100 mg in NS 100 mL 100 mg (09/28/20 0816)     LOS: 2 days    Time spent: 32 minutes  Darliss Cheney, MD [facility]  To contact the attending provider between 7A-7P or the covering provider during after hours 7P-7A, please log into the web site [email or web address] and access using universal [place] password for that web site. If you do not have the password, please call the hospital operator.  09/28/2020, 11:52 AM

## 2020-09-29 LAB — GLUCOSE, CAPILLARY
Glucose-Capillary: 264 mg/dL — ABNORMAL HIGH (ref 70–99)
Glucose-Capillary: 288 mg/dL — ABNORMAL HIGH (ref 70–99)
Glucose-Capillary: 284 mg/dL — ABNORMAL HIGH (ref 70–99)
Glucose-Capillary: 274 mg/dL — ABNORMAL HIGH (ref 70–99)

## 2020-09-29 MED ORDER — INSULIN GLARGINE-YFGN 100 UNIT/ML ~~LOC~~ SOLN
10.0000 [IU] | SUBCUTANEOUS | Status: AC
  Administered 2020-09-29: 10 [IU] via SUBCUTANEOUS
  Filled 2020-09-29: qty 0.1

## 2020-09-29 MED ORDER — INSULIN GLARGINE-YFGN 100 UNIT/ML ~~LOC~~ SOLN: 20.0000 [IU] | Freq: Every day | SUBCUTANEOUS | Status: DC

## 2020-09-29 MED ORDER — INSULIN GLARGINE-YFGN 100 UNIT/ML ~~LOC~~ SOLN
20.0000 [IU] | Freq: Every day | SUBCUTANEOUS | Status: DC
  Administered 2020-09-30: 20 [IU] via SUBCUTANEOUS
  Filled 2020-09-29: qty 0.2

## 2020-09-29 MED ORDER — PREDNISONE 50 MG PO TABS
50.0000 mg | ORAL_TABLET | Freq: Every day | ORAL | Status: DC
  Administered 2020-09-29 – 2020-09-30 (×2): 50 mg via ORAL
  Filled 2020-09-29 (×2): qty 1

## 2020-09-29 NOTE — Plan of Care (Signed)

## 2020-09-29 NOTE — Progress Notes (Signed)
PROGRESS NOTE    Kerry Powell  [id number] DOB: Aug 05, 1945 DOA: 09/25/2020 PCP: Binnie Rail, MD   Chief Complaint  Patient presents with   Weakness   Brief Narrative:  Kerry Powell is a 75 y.o. female with medical history significant for CVA, CAD, hypertension, GERD, COPD, IBS, type 2 diabetes, CKD who presented with worsening shortness of breath, body aches.  She's been admitted for COVID 19 virus infection with hypoxia.  Vaccinated with [employer] x2, but no booster.     Assessment & Plan:   Principal Problem:   Acute respiratory failure with hypoxia (HCC) Active Problems:   COPD (chronic obstructive pulmonary disease) (HCC)   History of CVA (cerebrovascular accident)   Essential hypertension, benign   Hyperlipidemia   AKI (acute kidney injury) ([place])  Acute Hypoxic Respiratory Failure  COVID 19 Virus Infection Doing much better.  Was on 1 L oxygen this morning.  Try to wean down to room air.  CXR with emphysema and chronic bronchial thickening CT chest without acute cardiopulm dz, Though her oxygen requirement is new) D dimer mildly elevated, Doppler lower extremity negative for DVT.  Remdesivir 8/1-present Solumedrol 8/1-present Discussed possibility of baricitinib/actemra if she were to decline.  Discussed general and specific risks (infection - vte/malignancy with baricitinib).  Would discuss and confirm if needed.   Strict I/O, daily weights Patient was encouraged to prone, out of bed to chair, to use incentive spirometry and flutter valve.   COVID-19 Labs  Recent Labs    09/26/20 1931 09/27/20 0335  DDIMER  --  0.65*  CRP 3.1* 2.0*     Lab Results  Component Value Date   SARSCOV2NAA POSITIVE (A) 09/25/2020   SARSCOV2NAA NOT DETECTED 03/26/2019   [place] Not Detected 02/17/2019   SARSCOV2NAA Not Detected 11/11/2018    AKI on CKD IIIb: Creatinine back to baseline which is around 1.5.  COPD: Not in exacerbation.  Continue current  management.  Hypertension: Controlled.  Continue metoprolol and amlodipine.  ACE inhibitor is on hold.  HLD Continue statin   Hx CVA Continue plavix   DVT prophylaxis: lovenox Code Status: full  Family Communication: none at bedside Disposition:   Status is: Inpatient  Remains inpatient appropriate because:Inpatient level of care appropriate due to severity of illness  Dispo: The patient is from: Home              Anticipated d/c is to: Home              Patient currently is not medically stable to d/c.   Difficult to place patient No       Consultants:  none  Procedures:  none  Antimicrobials:  Anti-infectives (From admission, onward)    Start     Dose/Rate Route Frequency Ordered Stop   09/27/20 1000  remdesivir 100 mg in sodium chloride 0.9 % 100 mL IVPB       See Hyperspace for full Linked Orders Report.   100 mg 200 mL/hr over 30 Minutes Intravenous Daily 09/26/20 0019 10/01/20 0959   09/26/20 0130  remdesivir 100 mg in sodium chloride 0.9 % 100 mL IVPB       See Hyperspace for full Linked Orders Report.   100 mg 200 mL/hr over 30 Minutes Intravenous  Once 09/26/20 0019 09/26/20 0310   09/26/20 0045  remdesivir 100 mg in sodium chloride 0.9 % 100 mL IVPB       See Hyperspace for full Linked Orders Report.   100 mg  200 mL/hr over 30 Minutes Intravenous  Once 09/26/20 0019 09/26/20 0235          Subjective: Seen and examined.  No complaints.  Breathing improved.  Looking forward to going home tomorrow.  Objective: Vitals:   09/28/20 2116 09/29/20 0607 09/29/20 0919 09/29/20 1154  BP: 116/76 115/61    Pulse: (!) 58 (!) 58 65   Resp: 20     Temp: 98.3 F (36.8 C) 97.8 F (36.6 C)    TempSrc: Oral Oral    SpO2: (!) 89% 90%  90%  Weight:      Height:        Intake/Output Summary (Last 24 hours) at 09/29/2020 1258 Last data filed at 09/29/2020 1000 Gross per 24 hour  Intake 680 ml  Output --  Net 680 ml    Filed Weights   09/25/20 2112  09/28/20 0544  Weight: 101.2 kg 102.8 kg    Examination: General exam: Appears calm and comfortable  Respiratory system: Clear to auscultation. Respiratory effort normal. Cardiovascular system: S1 & S2 heard, RRR. No JVD, murmurs, rubs, gallops or clicks. No pedal edema. Gastrointestinal system: Abdomen is nondistended, soft and nontender. No organomegaly or masses felt. Normal bowel sounds heard. Central nervous system: Alert and oriented. No focal neurological deficits. Extremities: Symmetric 5 x 5 power. Skin: No rashes, lesions or ulcers.  Psychiatry: Judgement and insight appear normal. Mood & affect appropriate.  . Data Reviewed: I have personally reviewed following labs and imaging studies  CBC: Recent Labs  Lab 09/25/20 2233 09/27/20 0335 09/28/20 0342  WBC 4.2 4.1 6.2  NEUTROABS 2.6 2.8 5.2  HGB 14.7 14.5 13.4  HCT 45.4 45.4 41.7  MCV 96.8 97.6 97.9  PLT 235 266 245     Basic Metabolic Panel: Recent Labs  Lab 09/25/20 2233 09/27/20 0335 09/28/20 0342  NA 133* 136 132*  K 4.6 4.7 4.8  CL 99 100 103  CO2 '26 25 23  '$ GLUCOSE 136* 219* 234*  BUN 19 23 26*  CREATININE 1.75* 1.51* 1.51*  CALCIUM 8.4* 8.6* 8.1*  MG  --  2.3 2.3  PHOS  --  3.1 2.8     GFR: Estimated Creatinine Clearance: 38.3 mL/min (A) (by C-G formula based on SCr of 1.51 mg/dL (H)).  Liver Function Tests: Recent Labs  Lab 09/25/20 2233 09/27/20 0335 09/28/20 0342  AST '22 27 28  '$ ALT '17 20 23  '$ ALKPHOS 82 75 68  BILITOT 0.6 0.5 0.5  PROT 7.3 7.2 6.4*  ALBUMIN 3.8 3.9 3.4*     CBG: Recent Labs  Lab 09/28/20 1153 09/28/20 1625 09/28/20 2114 09/29/20 0743 09/29/20 1144  GLUCAP 258* 235* 170* 264* 288*      Recent Results (from the past 240 hour(s))  Urine Culture     Status: None   Collection Time: 09/25/20 10:00 PM   Specimen: Urine, Clean Catch  Result Value Ref Range Status   Specimen Description   Final    URINE, CLEAN CATCH Performed at [facility], [place] [address] [place], [place] [address]    Special Requests   Final    NONE Performed at [facility], [place] [address] [place], [place] [address]    Culture   Final    NO GROWTH Performed at [facility], [place] [address] [place], [place] [address]    Report Status 09/27/2020 FINAL  Final  Resp Panel by RT-PCR (Flu A&B, Covid) Nasopharyngeal Swab  Status: Abnormal   Collection Time: 09/25/20 10:00 PM   Specimen: Nasopharyngeal Swab; Nasopharyngeal(NP) swabs in vial transport medium  Result Value Ref Range Status   SARS Coronavirus 2 by RT PCR POSITIVE (A) NEGATIVE Final    Comment: RESULT CALLED TO, READ BACK BY AND VERIFIED WITH: NATASHA WHEATLEY AT 0009 ON 09/26/20 BY MAJ (NOTE) SARS-CoV-2 target nucleic acids are DETECTED.  The SARS-CoV-2 RNA is generally detectable in upper respiratory specimens during the acute phase of infection. Positive results are indicative of the presence of the identified virus, but do not rule out bacterial infection or co-infection with other pathogens not detected by the test. Clinical correlation with patient history and other diagnostic information is necessary to determine patient infection status. The expected result is Negative.  Fact Sheet for Patients: EntrepreneurPulse.com.au  Fact Sheet for Healthcare Providers: IncredibleEmployment.be  This test is not yet approved or cleared by the [place] FDA and  has been authorized for detection and/or diagnosis of SARS-CoV-2 by FDA under an Emergency Use Authorization (EUA).  This EUA will remain in effect (meaning this t est can be used) for the duration of  the COVID-19 declaration under Section 564(b)(1) of the Act, 21 U.S.C. section 360bbb-3(b)(1), unless the authorization is terminated or revoked sooner.     Influenza A by PCR NEGATIVE NEGATIVE Final   Influenza B by PCR NEGATIVE NEGATIVE Final    Comment:  (NOTE) The Xpert Xpress SARS-CoV-2/FLU/RSV plus assay is intended as an aid in the diagnosis of influenza from Nasopharyngeal swab specimens and should not be used as a sole basis for treatment. Nasal washings and aspirates are unacceptable for Xpert Xpress SARS-CoV-2/FLU/RSV testing.  Fact Sheet for Patients: EntrepreneurPulse.com.au  Fact Sheet for Healthcare Providers: IncredibleEmployment.be  This test is not yet approved or cleared by the [place] FDA and has been authorized for detection and/or diagnosis of SARS-CoV-2 by FDA under an Emergency Use Authorization (EUA). This EUA will remain in effect (meaning this test can be used) for the duration of the COVID-19 declaration under Section 564(b)(1) of the Act, 21 U.S.C. section 360bbb-3(b)(1), unless the authorization is terminated or revoked.  Performed at [facility], [place] [address] [place], [place] [address]           Radiology Studies: VAS [place] LOWER EXTREMITY VENOUS (DVT)  Result Date: 09/27/2020  Lower Venous DVT Study Patient Name:  Kerry Powell  Date of Exam:   09/27/2020 Medical Rec #: [id number]       Accession #:    [id number] Date of Birth: 1945-12-31       Patient Gender: F Patient Age:   73Y Exam Location:  [facility] Procedure:      VAS [place] LOWER EXTREMITY VENOUS (DVT) Referring Phys: [id number] A CALDWELL POWELL JR --------------------------------------------------------------------------------  Indications: Elevated D-dimer & COVID+.  Limitations: Body habitus, poor ultrasound/tissue interface and Patient inability to cooperate due to pain. Comparison Study: Previous exam 05/13/14 - negative Performing Technologist: Rogelia Rohrer RVT, RDMS  Examination Guidelines: A complete evaluation includes B-mode imaging, spectral Doppler, color Doppler, and power Doppler as needed of all accessible portions of each vessel. Bilateral testing is considered an integral  part of a complete examination. Limited examinations for reoccurring indications may be performed as noted. The reflux portion of the exam is performed with the patient in reverse Trendelenburg.  +---------+---------------+---------+-----------+----------+--------------+ RIGHT    CompressibilityPhasicitySpontaneityPropertiesThrombus Aging +---------+---------------+---------+-----------+----------+--------------+ CFV      Full  Yes      Yes                                 +---------+---------------+---------+-----------+----------+--------------+ SFJ      Full                                                        +---------+---------------+---------+-----------+----------+--------------+ FV Prox  Full           Yes      Yes                                 +---------+---------------+---------+-----------+----------+--------------+ FV Mid   Full           Yes      Yes                                 +---------+---------------+---------+-----------+----------+--------------+ FV DistalFull           Yes      Yes                                 +---------+---------------+---------+-----------+----------+--------------+ PFV      Full                                                        +---------+---------------+---------+-----------+----------+--------------+ POP      Full           Yes      Yes                                 +---------+---------------+---------+-----------+----------+--------------+ PTV      Full                                                        +---------+---------------+---------+-----------+----------+--------------+ PERO     Full                                                        +---------+---------------+---------+-----------+----------+--------------+   +---------+---------------+---------+-----------+----------+-------------------+ LEFT     CompressibilityPhasicitySpontaneityPropertiesThrombus Aging       +---------+---------------+---------+-----------+----------+-------------------+ CFV      Full           Yes      Yes                                      +---------+---------------+---------+-----------+----------+-------------------+ SFJ      Full                                                             +---------+---------------+---------+-----------+----------+-------------------+  FV Prox  Full           Yes      Yes                                      +---------+---------------+---------+-----------+----------+-------------------+ FV Mid                  Yes      Yes                  Patent by color &                                                         doppler             +---------+---------------+---------+-----------+----------+-------------------+ FV Distal               Yes      Yes                  Patent by color &                                                         doppler             +---------+---------------+---------+-----------+----------+-------------------+ PFV      Full                                                             +---------+---------------+---------+-----------+----------+-------------------+ POP      Full           Yes      Yes                                      +---------+---------------+---------+-----------+----------+-------------------+ PTV      Full                                                             +---------+---------------+---------+-----------+----------+-------------------+ PERO     Full                                                             +---------+---------------+---------+-----------+----------+-------------------+ Patient unable to tolerate compressions due to pain - refused.    Summary: BILATERAL: - No evidence of deep vein thrombosis seen in the lower extremities, bilaterally. - No evidence of superficial venous thrombosis in the lower extremities,  bilaterally. -No evidence of popliteal  cyst, bilaterally.  LEFT: - Portions of this examination were limited- see technologist comments above.  *See table(s) above for measurements and observations. Electronically signed by Deitra Mayo MD on 09/27/2020 at 5:01:03 PM.    Final         Scheduled Meds:  allopurinol  300 mg Oral Daily   amLODipine  7.5 mg Oral Daily   clopidogrel  75 mg Oral Daily   DULoxetine  90 mg Oral Daily   enoxaparin (LOVENOX) injection  40 mg Subcutaneous Q24H   ferrous sulfate  325 mg Oral Q breakfast   fluticasone  2 spray Each Nare Daily   insulin aspart  0-5 Units Subcutaneous QHS   insulin aspart  0-9 Units Subcutaneous TID WC   [START ON 09/30/2020] insulin glargine-yfgn  20 Units Subcutaneous Daily   metoprolol succinate  50 mg Oral Daily   pantoprazole  40 mg Oral BID AC   predniSONE  50 mg Oral Q breakfast   rosuvastatin  40 mg Oral Daily   umeclidinium-vilanterol  1 puff Inhalation Daily   Continuous Infusions:  remdesivir 100 mg in NS 100 mL 100 mg (09/29/20 0923)     LOS: 3 days    Time spent: 29 minutes  Darliss Cheney, MD [facility]  To contact the attending provider between 7A-7P or the covering provider during after hours 7P-7A, please log into the web site [email or web address] and access using universal [place] password for that web site. If you do not have the password, please call the hospital operator.  09/29/2020, 12:58 PM

## 2020-09-30 ENCOUNTER — Encounter: Payer: Medicare Other | Admitting: Internal Medicine

## 2020-09-30 LAB — COMPREHENSIVE METABOLIC PANEL
ALT: 21 U/L (ref 0–44)
AST: 16 U/L (ref 15–41)
Albumin: 3.4 g/dL — ABNORMAL LOW (ref 3.5–5.0)
Alkaline Phosphatase: 63 U/L (ref 38–126)
Anion gap: 6 (ref 5–15)
BUN: 33 mg/dL — ABNORMAL HIGH (ref 8–23)
CO2: 25 mmol/L (ref 22–32)
Calcium: 8.5 mg/dL — ABNORMAL LOW (ref 8.9–10.3)
Chloride: 103 mmol/L (ref 98–111)
Creatinine, Ser: 1.31 mg/dL — ABNORMAL HIGH (ref 0.44–1.00)
GFR, Estimated: 42 mL/min — ABNORMAL LOW (ref >60)
Glucose, Bld: 246 mg/dL — ABNORMAL HIGH (ref 70–99)
Potassium: 4.6 mmol/L (ref 3.5–5.1)
Sodium: 134 mmol/L — ABNORMAL LOW (ref 135–145)
Total Bilirubin: 0.2 mg/dL — ABNORMAL LOW (ref 0.3–1.2)
Total Protein: 6.3 g/dL — ABNORMAL LOW (ref 6.5–8.1)

## 2020-09-30 LAB — C-REACTIVE PROTEIN: CRP: 0.5 mg/dL (ref <1.0)

## 2020-09-30 LAB — GLUCOSE, CAPILLARY
Glucose-Capillary: 181 mg/dL — ABNORMAL HIGH (ref 70–99)
Glucose-Capillary: 205 mg/dL — ABNORMAL HIGH (ref 70–99)

## 2020-09-30 MED ORDER — METFORMIN HCL 1000 MG PO TABS: 1000.0000 mg | ORAL_TABLET | Freq: Every day | ORAL | 0 refills | Status: DC

## 2020-09-30 MED ORDER — PREDNISONE 50 MG PO TABS: 50.0000 mg | ORAL_TABLET | Freq: Every day | ORAL | 0 refills | Status: AC

## 2020-09-30 NOTE — TOC Progression Note (Signed)
Transition of Care [facility]) - Progression Note    Patient Details  Name: Kerry Powell MRN: [id number] Date of Birth: 08-17-1945  [facility] CM/SW Contact  Leeroy Cha, RN Phone Number: 09/30/2020, 11:14 AM  Clinical Narrative:    [place] home with no toc needs   Expected Discharge Plan: Home/Self Care Barriers to Discharge: No Barriers Identified  Expected Discharge Plan and Services Expected Discharge Plan: Home/Self Care       Living arrangements for the past 2 months: Single Family Home Expected Discharge Date: 09/30/20                                     Social Determinants of Health (SDOH) Interventions    Readmission Risk Interventions No flowsheet data found.

## 2020-09-30 NOTE — Progress Notes (Signed)
SATURATION QUALIFICATIONS: (This note is used to comply with regulatory documentation for home oxygen)  Patient Saturations on Room Air at Rest = 96%  Patient Saturations on Room Air while Ambulating = 94%  Patient Saturations on  Liters of oxygen while Ambulating = %NT  Please briefly explain why patient needs home oxygen:Patient does not require supplemental oxygen. Tresa Endo PT Acute Rehabilitation Services Pager [phone] Office [phone]

## 2020-09-30 NOTE — Discharge Summary (Signed)
Physician Discharge Summary  Kerry Powell [id number] DOB: [birth year] DOA: 09/25/2020  PCP: Binnie Rail, MD  Admit date: 09/25/2020 Discharge date: 09/30/2020 30 Day Unplanned Readmission Risk Score    Flowsheet Row ED to Hosp-Admission (Current) from 09/25/2020 in [facility]  30 Day Unplanned Readmission Risk Score (%) 21.6 Filed at 09/30/2020 0801       This score is the patient's risk of an unplanned readmission within 30 days of being discharged (0 -100%). The score is based on dignosis, age, lab data, medications, orders, and past utilization.   Low:  0-14.9   Medium: 15-21.9   High: 22-29.9   Extreme: 30 and above          Admitted From: Home Disposition: Home  Recommendations for Outpatient Follow-up:  Follow up with PCP in 1-2 weeks Please obtain BMP/CBC in one week Please follow up with your PCP on the following pending results: Unresulted Labs (From admission, onward)    None         Home Health: None Equipment/Devices: None  Discharge Condition: Stable CODE STATUS: Full code Diet recommendation: Cardiac  Subjective: Seen and examined.  Feels well.  No shortness of breath.  No other complaint.  Excited to go home.  Brief/Interim Summary: Kerry Powell is a 75 y.o. female with medical history significant for CVA, CAD, hypertension, GERD, COPD, IBS, type 2 diabetes, CKD who presented with worsening shortness of breath, body aches.  She was tested positive for COVID-19 and was also diagnosed with acute hypoxic respiratory failure secondary to that and was admitted to hospital service.  Chest x-ray with emphysema and chronic bronchial thickening.  CT chest without acute pulmonary disease.  D-dimer was elevated however Doppler lower extremity was negative for DVT.  She received 5 days of remdesivir and was also started on Solu-Medrol.  Gradually over the course of last 4 to 5 days, she has been weaned down to room air and  has remained on room air since yesterday.  Ambulatory oximetry was also checked and she was saturating more than 94% on room air.  Inflammatory markers improving.  She was evaluated by PT OT and they did not recommend any further PT OT needs.  She is being discharged in stable condition.  She will be prescribed 4 more days of oral prednisone.  Her diabetes was slightly uncontrolled due to hyperglycemia.  Recent hemoglobin A1c was 7.4.  She was on metformin 500 mg p.o. twice daily.  I am increasing the dose to 1000 mg p.o. twice daily.    Discharge Diagnoses:  Principal Problem:   Acute respiratory failure with hypoxia (HCC) Active Problems:   COPD (chronic obstructive pulmonary disease) (HCC)   History of CVA (cerebrovascular accident)   Essential hypertension, benign   Hyperlipidemia   AKI (acute kidney injury) ([place])    Discharge Instructions   Allergies as of 09/30/2020       Reactions   Gabapentin Other (See Comments)   Fluid retention    Clindamycin Rash   Sulfa Antibiotics Nausea And Vomiting        Medication List     TAKE these medications    ACETAMINOPHEN-BUTALBITAL 50-325 MG Tabs TAKE 1 TABLET BY MOUTH EVERY DAY AS NEEDED   allopurinol 300 MG tablet Commonly known as: ZYLOPRIM TAKE 1 TABLET BY MOUTH EVERY DAY   amLODipine 5 MG tablet Commonly known as: NORVASC Take 1.5 tablets (7.5 mg total) by mouth daily.  cetirizine 10 MG tablet Commonly known as: ZyrTEC Allergy Take 1 tablet (10 mg total) by mouth daily. What changed:  when to take this reasons to take this   cholecalciferol 25 MCG (1000 UNIT) tablet Commonly known as: VITAMIN D3 Take 2,000 Units by mouth daily.   clopidogrel 75 MG tablet Commonly known as: PLAVIX TAKE 1 TABLET BY MOUTH EVERY DAY   cyclobenzaprine 5 MG tablet Commonly known as: FLEXERIL Take 1 tablet (5 mg total) by mouth 3 (three) times daily as needed for muscle spasms.   DULoxetine 30 MG capsule Commonly known as:  Cymbalta Take 1 capsule (30 mg total) by mouth daily. For total of 90 mg daily What changed: additional instructions   DULoxetine 60 MG capsule Commonly known as: CYMBALTA Take 1 capsule by mouth once daily What changed: Another medication with the same name was changed. Make sure you understand how and when to take each.   ferrous sulfate 325 (65 FE) MG tablet Take 325 mg by mouth daily with breakfast.   fluticasone 50 MCG/ACT nasal spray Commonly known as: Flonase Place 2 sprays into both nostrils daily.   furosemide 20 MG tablet Commonly known as: LASIX Take 20 mg by mouth 3 (three) times daily as needed for edema or fluid. 3 times a week as needed for swelling   ipratropium-albuterol 0.5-2.5 (3) MG/3ML Soln Commonly known as: DUONEB USE 1 VIAL IN NEBULIZER 4 TIMES DAILY. Generic: DUONEB   metFORMIN 1000 MG tablet Commonly known as: Glucophage Take 1 tablet (1,000 mg total) by mouth daily with breakfast. What changed:  medication strength how much to take when to take this   metoprolol succinate 50 MG 24 hr tablet Commonly known as: TOPROL-XL Take 50 mg by mouth daily.   montelukast 10 MG tablet Commonly known as: SINGULAIR TAKE 1 TABLET BY MOUTH ONCE DAILY AT BEDTIME . APPOINTMENT REQUIRED FOR FUTURE REFILLS What changed: See the new instructions.   MUCINEX FAST-MAX CONGEST COUGH PO Take by mouth as needed.   pantoprazole 40 MG tablet Commonly known as: PROTONIX Take 1 tablet (40 mg total) by mouth 2 (two) times daily before a meal.   predniSONE 50 MG tablet Commonly known as: DELTASONE Take 1 tablet (50 mg total) by mouth daily with breakfast for 4 days.   rosuvastatin 40 MG tablet Commonly known as: CRESTOR Take 1 tablet (40 mg total) by mouth daily.   telmisartan 40 MG tablet Commonly known as: MICARDIS Take 40 mg by mouth daily.   umeclidinium-vilanterol 62.5-25 MCG/INH Aepb Commonly known as: ANORO ELLIPTA Inhale 1 puff into the lungs daily.         Follow-up Information     Binnie Rail, MD Follow up in 1 week(s).   Specialty: Internal Medicine Contact information: [place] [address] [phone]         Sueanne Margarita, MD .   Specialty: Cardiology Contact information: [phone]  [place] [address] [phone]                Allergies  Allergen Reactions   Gabapentin Other (See Comments)    Fluid retention    Clindamycin Rash   Sulfa Antibiotics Nausea And Vomiting    Consultations: None   Procedures/Studies: CT CHEST WO CONTRAST  Result Date: 09/26/2020 CLINICAL DATA:  Respiratory failure.  COVID.  Shortness of breath. EXAM: CT CHEST WITHOUT CONTRAST TECHNIQUE: Multidetector CT imaging of the chest was performed following the standard protocol  without IV contrast. COMPARISON:  05/06/2020 FINDINGS: Cardiovascular: Coronary artery and aortic atherosclerosis. Heart is normal size. No aneurysm. Mediastinum/Nodes: Small scattered mediastinal lymph nodes, likely reactive. No mediastinal, hilar, or axillary adenopathy. Thyroid unremarkable. Trachea and esophagus are unremarkable. Moderate emphysema Lungs/Pleura: Moderate emphysema. Areas of scarring in the right mid lung and both lower lobes. No effusions. Upper Abdomen: Imaging into the upper abdomen demonstrates no acute findings. Musculoskeletal: Chest wall soft tissues are unremarkable. No acute bony abnormality. IMPRESSION: No acute cardiopulmonary disease. Coronary artery disease. Aortic Atherosclerosis (ICD10-I70.0) and Emphysema (ICD10-J43.9). Electronically Signed   By: Rolm Baptise M.D.   On: 09/26/2020 09:51   DG Chest Port 1 View  Result Date: 09/25/2020 CLINICAL DATA:  Hypoxia.  Weakness. EXAM: PORTABLE CHEST 1 VIEW COMPARISON:  03/26/2019, CT 05/06/2020 FINDINGS: Lungs are hyperinflated with emphysema. Chronic bronchial thickening. Normal heart size. Stable mediastinal contours with aortic  atherosclerosis. No focal airspace disease. No pleural effusion or pneumothorax. No acute osseous abnormalities are seen. IMPRESSION: Emphysema and chronic bronchial thickening. No superimposed acute process. Electronically Signed   By: Keith Rake M.D.   On: 09/25/2020 22:41   VAS [place] LOWER EXTREMITY VENOUS (DVT)  Result Date: 09/27/2020  Lower Venous DVT Study Patient Name:  LESHEA LAUMANN  Date of Exam:   09/27/2020 Medical Rec #: [id number]       Accession #:    [id number] Date of Birth: [birth year]       Patient Gender: F Patient Age:   37Y Exam Location:  [facility] Procedure:      VAS [place] LOWER EXTREMITY VENOUS (DVT) Referring Phys: [id number] A CALDWELL POWELL JR --------------------------------------------------------------------------------  Indications: Elevated D-dimer & COVID+.  Limitations: Body habitus, poor ultrasound/tissue interface and Patient inability to cooperate due to pain. Comparison Study: Previous exam 05/13/14 - negative Performing Technologist: Rogelia Rohrer RVT, RDMS  Examination Guidelines: A complete evaluation includes B-mode imaging, spectral Doppler, color Doppler, and power Doppler as needed of all accessible portions of each vessel. Bilateral testing is considered an integral part of a complete examination. Limited examinations for reoccurring indications may be performed as noted. The reflux portion of the exam is performed with the patient in reverse Trendelenburg.  +---------+---------------+---------+-----------+----------+--------------+ RIGHT    CompressibilityPhasicitySpontaneityPropertiesThrombus Aging +---------+---------------+---------+-----------+----------+--------------+ CFV      Full           Yes      Yes                                 +---------+---------------+---------+-----------+----------+--------------+ SFJ      Full                                                         +---------+---------------+---------+-----------+----------+--------------+ FV Prox  Full           Yes      Yes                                 +---------+---------------+---------+-----------+----------+--------------+ FV Mid   Full           Yes      Yes                                 +---------+---------------+---------+-----------+----------+--------------+  FV DistalFull           Yes      Yes                                 +---------+---------------+---------+-----------+----------+--------------+ PFV      Full                                                        +---------+---------------+---------+-----------+----------+--------------+ POP      Full           Yes      Yes                                 +---------+---------------+---------+-----------+----------+--------------+ PTV      Full                                                        +---------+---------------+---------+-----------+----------+--------------+ PERO     Full                                                        +---------+---------------+---------+-----------+----------+--------------+   +---------+---------------+---------+-----------+----------+-------------------+ LEFT     CompressibilityPhasicitySpontaneityPropertiesThrombus Aging      +---------+---------------+---------+-----------+----------+-------------------+ CFV      Full           Yes      Yes                                      +---------+---------------+---------+-----------+----------+-------------------+ SFJ      Full                                                             +---------+---------------+---------+-----------+----------+-------------------+ FV Prox  Full           Yes      Yes                                      +---------+---------------+---------+-----------+----------+-------------------+ FV Mid                  Yes      Yes                  Patent by color &  doppler             +---------+---------------+---------+-----------+----------+-------------------+ FV Distal               Yes      Yes                  Patent by color &                                                         doppler             +---------+---------------+---------+-----------+----------+-------------------+ PFV      Full                                                             +---------+---------------+---------+-----------+----------+-------------------+ POP      Full           Yes      Yes                                      +---------+---------------+---------+-----------+----------+-------------------+ PTV      Full                                                             +---------+---------------+---------+-----------+----------+-------------------+ PERO     Full                                                             +---------+---------------+---------+-----------+----------+-------------------+ Patient unable to tolerate compressions due to pain - refused.    Summary: BILATERAL: - No evidence of deep vein thrombosis seen in the lower extremities, bilaterally. - No evidence of superficial venous thrombosis in the lower extremities, bilaterally. -No evidence of popliteal cyst, bilaterally.  LEFT: - Portions of this examination were limited- see technologist comments above.  *See table(s) above for measurements and observations. Electronically signed by Deitra Mayo MD on 09/27/2020 at 5:01:03 PM.    Final      Discharge Exam: Vitals:   09/29/20 2119 09/30/20 0528  BP: 129/72 129/82  Pulse: 63 (!) 57  Resp: 15 15  Temp: 98.3 F (36.8 C) 97.8 F (36.6 C)  SpO2: 90% 95%   Vitals:   09/29/20 1154 09/29/20 1417 09/29/20 2119 09/30/20 0528  BP:  127/73 129/72 129/82  Pulse:  (!) 57 63 (!) 57  Resp:  '20 15 15  '$ Temp:  98.4 F (36.9 C) 98.3 F (36.8 C) 97.8  F (36.6 C)  TempSrc:  Oral Oral Oral  SpO2: 90% 95% 90% 95%  Weight:    102.4 kg  Height:        General: Pt is alert, awake, not in  acute distress Cardiovascular: RRR, S1/S2 +, no rubs, no gallops Respiratory: CTA bilaterally, no wheezing, no rhonchi Abdominal: Soft, NT, ND, bowel sounds + Extremities: no edema, no cyanosis    The results of significant diagnostics from this hospitalization (including imaging, microbiology, ancillary and laboratory) are listed below for reference.     Microbiology: Recent Results (from the past 240 hour(s))  Urine Culture     Status: None   Collection Time: 09/25/20 10:00 PM   Specimen: Urine, Clean Catch  Result Value Ref Range Status   Specimen Description   Final    URINE, CLEAN CATCH Performed at [facility], [place] [address] [place], [place] [address]    Special Requests   Final    NONE Performed at [facility], [place] [address], [place], [place] [address]    Culture   Final    NO GROWTH Performed at [facility], [place] [address] [place], [place] [address]    Report Status 09/27/2020 FINAL  Final  Resp Panel by RT-PCR (Flu A&B, Covid) Nasopharyngeal Swab     Status: Abnormal   Collection Time: 09/25/20 10:00 PM   Specimen: Nasopharyngeal Swab; Nasopharyngeal(NP) swabs in vial transport medium  Result Value Ref Range Status   SARS Coronavirus 2 by RT PCR POSITIVE (A) NEGATIVE Final    Comment: RESULT CALLED TO, READ BACK BY AND VERIFIED WITH: NATASHA WHEATLEY AT 0009 ON 09/26/20 BY MAJ (NOTE) SARS-CoV-2 target nucleic acids are DETECTED.  The SARS-CoV-2 RNA is generally detectable in upper respiratory specimens during the acute phase of infection. Positive results are indicative of the presence of the identified virus, but do not rule out bacterial infection or co-infection with other pathogens not detected by the test. Clinical correlation with patient history and other diagnostic  information is necessary to determine patient infection status. The expected result is Negative.  Fact Sheet for Patients: EntrepreneurPulse.com.au  Fact Sheet for Healthcare Providers: IncredibleEmployment.be  This test is not yet approved or cleared by the [place] FDA and  has been authorized for detection and/or diagnosis of SARS-CoV-2 by FDA under an Emergency Use Authorization (EUA).  This EUA will remain in effect (meaning this t est can be used) for the duration of  the COVID-19 declaration under Section 564(b)(1) of the Act, 21 U.S.C. section 360bbb-3(b)(1), unless the authorization is terminated or revoked sooner.     Influenza A by PCR NEGATIVE NEGATIVE Final   Influenza B by PCR NEGATIVE NEGATIVE Final    Comment: (NOTE) The Xpert Xpress SARS-CoV-2/FLU/RSV plus assay is intended as an aid in the diagnosis of influenza from Nasopharyngeal swab specimens and should not be used as a sole basis for treatment. Nasal washings and aspirates are unacceptable for Xpert Xpress SARS-CoV-2/FLU/RSV testing.  Fact Sheet for Patients: EntrepreneurPulse.com.au  Fact Sheet for Healthcare Providers: IncredibleEmployment.be  This test is not yet approved or cleared by the [place] FDA and has been authorized for detection and/or diagnosis of SARS-CoV-2 by FDA under an Emergency Use Authorization (EUA). This EUA will remain in effect (meaning this test can be used) for the duration of the COVID-19 declaration under Section 564(b)(1) of the Act, 21 U.S.C. section 360bbb-3(b)(1), unless the authorization is terminated or revoked.  Performed at [facility], [place] [address], [place], [place] [address]      Labs: BNP (last 3 results) No results for input(s): BNP in the last 8760 hours. Basic Metabolic Panel: Recent Labs  Lab 09/25/20 2233  09/27/20 0335 09/28/20 0342  09/30/20 0346  NA 133* 136 132* 134*  K 4.6 4.7 4.8 4.6  CL 99 100 103 103  CO2 '26 25 23 25  '$ GLUCOSE 136* 219* 234* 246*  BUN 19 23 26* 33*  CREATININE 1.75* 1.51* 1.51* 1.31*  CALCIUM 8.4* 8.6* 8.1* 8.5*  MG  --  2.3 2.3  --   PHOS  --  3.1 2.8  --    Liver Function Tests: Recent Labs  Lab 09/25/20 2233 09/27/20 0335 09/28/20 0342 09/30/20 0346  AST '22 27 28 16  '$ ALT '17 20 23 21  '$ ALKPHOS 82 75 68 63  BILITOT 0.6 0.5 0.5 0.2*  PROT 7.3 7.2 6.4* 6.3*  ALBUMIN 3.8 3.9 3.4* 3.4*   No results for input(s): LIPASE, AMYLASE in the last 168 hours. No results for input(s): AMMONIA in the last 168 hours. CBC: Recent Labs  Lab 09/25/20 2233 09/27/20 0335 09/28/20 0342  WBC 4.2 4.1 6.2  NEUTROABS 2.6 2.8 5.2  HGB 14.7 14.5 13.4  HCT 45.4 45.4 41.7  MCV 96.8 97.6 97.9  PLT 235 266 245   Cardiac Enzymes: No results for input(s): CKTOTAL, CKMB, CKMBINDEX, TROPONINI in the last 168 hours. BNP: Invalid input(s): POCBNP CBG: Recent Labs  Lab 09/29/20 0743 09/29/20 1144 09/29/20 1652 09/29/20 2141 09/30/20 0742  GLUCAP 264* 288* 284* 274* 181*   D-Dimer No results for input(s): DDIMER in the last 72 hours. Hgb A1c No results for input(s): HGBA1C in the last 72 hours. Lipid Profile No results for input(s): CHOL, HDL, LDLCALC, TRIG, CHOLHDL, LDLDIRECT in the last 72 hours. Thyroid function studies No results for input(s): TSH, T4TOTAL, T3FREE, THYROIDAB in the last 72 hours.  Invalid input(s): FREET3 Anemia work up No results for input(s): VITAMINB12, FOLATE, FERRITIN, TIBC, IRON, RETICCTPCT in the last 72 hours. Urinalysis    Component Value Date/Time   COLORURINE YELLOW 09/25/2020 2159   APPEARANCEUR CLEAR 09/25/2020 2159   LABSPEC 1.017 09/25/2020 2159   PHURINE 6.0 09/25/2020 2159   GLUCOSEU NEGATIVE 09/25/2020 2159   GLUCOSEU NEGATIVE 08/01/2020 1336   HGBUR NEGATIVE 09/25/2020 2159   BILIRUBINUR NEGATIVE 09/25/2020 2159   KETONESUR NEGATIVE 09/25/2020  2159   PROTEINUR NEGATIVE 09/25/2020 2159   UROBILINOGEN 0.2 08/01/2020 1336   NITRITE NEGATIVE 09/25/2020 2159   LEUKOCYTESUR NEGATIVE 09/25/2020 2159   Sepsis Labs Invalid input(s): PROCALCITONIN,  WBC,  LACTICIDVEN Microbiology Recent Results (from the past 240 hour(s))  Urine Culture     Status: None   Collection Time: 09/25/20 10:00 PM   Specimen: Urine, Clean Catch  Result Value Ref Range Status   Specimen Description   Final    URINE, CLEAN CATCH Performed at [facility], [place] [address] [place], [place] [address]    Special Requests   Final    NONE Performed at [facility], [place] [address] [place], [place] [address]    Culture   Final    NO GROWTH Performed at [facility], [place] [address] [place], [place] [address]    Report Status 09/27/2020 FINAL  Final  Resp Panel by RT-PCR (Flu A&B, Covid) Nasopharyngeal Swab     Status: Abnormal   Collection Time: 09/25/20 10:00 PM   Specimen: Nasopharyngeal Swab; Nasopharyngeal(NP) swabs in vial transport medium  Result Value Ref Range Status   SARS Coronavirus 2 by RT PCR POSITIVE (A) NEGATIVE Final    Comment: RESULT CALLED TO, READ BACK BY AND VERIFIED WITH: NATASHA WHEATLEY  AT 0009 ON 09/26/20 BY MAJ (NOTE) SARS-CoV-2 target nucleic acids are DETECTED.  The SARS-CoV-2 RNA is generally detectable in upper respiratory specimens during the acute phase of infection. Positive results are indicative of the presence of the identified virus, but do not rule out bacterial infection or co-infection with other pathogens not detected by the test. Clinical correlation with patient history and other diagnostic information is necessary to determine patient infection status. The expected result is Negative.  Fact Sheet for Patients: EntrepreneurPulse.com.au  Fact Sheet for Healthcare Providers: IncredibleEmployment.be  This test is not yet approved  or cleared by the [place] FDA and  has been authorized for detection and/or diagnosis of SARS-CoV-2 by FDA under an Emergency Use Authorization (EUA).  This EUA will remain in effect (meaning this t est can be used) for the duration of  the COVID-19 declaration under Section 564(b)(1) of the Act, 21 U.S.C. section 360bbb-3(b)(1), unless the authorization is terminated or revoked sooner.     Influenza A by PCR NEGATIVE NEGATIVE Final   Influenza B by PCR NEGATIVE NEGATIVE Final    Comment: (NOTE) The Xpert Xpress SARS-CoV-2/FLU/RSV plus assay is intended as an aid in the diagnosis of influenza from Nasopharyngeal swab specimens and should not be used as a sole basis for treatment. Nasal washings and aspirates are unacceptable for Xpert Xpress SARS-CoV-2/FLU/RSV testing.  Fact Sheet for Patients: EntrepreneurPulse.com.au  Fact Sheet for Healthcare Providers: IncredibleEmployment.be  This test is not yet approved or cleared by the [place] FDA and has been authorized for detection and/or diagnosis of SARS-CoV-2 by FDA under an Emergency Use Authorization (EUA). This EUA will remain in effect (meaning this test can be used) for the duration of the COVID-19 declaration under Section 564(b)(1) of the Act, 21 U.S.C. section 360bbb-3(b)(1), unless the authorization is terminated or revoked.  Performed at [facility], [place] [address] [place], [place] [address]      Time coordinating discharge: Over 30 minutes  SIGNED:   Darliss Cheney, MD  [facility] 09/30/2020, 10:03 AM  If 7PM-7AM, please contact night-coverage [email or web address]

## 2020-09-30 NOTE — Plan of Care (Signed)
  Problem: Health Behavior/Discharge Planning: Goal: Ability to manage health-related needs will improve Outcome: Progressing   Problem: Clinical Measurements: Goal: Respiratory complications will improve Outcome: Progressing   Problem: Activity: Goal: Risk for activity intolerance will decrease Outcome: Progressing   Problem: Pain Managment: Goal: General experience of comfort will improve Outcome: Progressing

## 2020-09-30 NOTE — Progress Notes (Signed)
Physical Therapy Treatment Patient Details Name: Kerry Powell MRN: [id number] DOB: [birth year] Today's Date: 09/30/2020    History of Present Illness Kerry Powell is a 75 y.o. female with medical history significant for CVA, CAD, hypertension, GERD, COPD, IBS, type 2 diabetes, CKD who presents with concerns of worsening shortness of breath.     She's been admitted for COVID 19 virus infection with hypoxia    PT Comments    Patient ambulated 40' x 2 on Ra. SPO2 >94%. Patient may Dc home today.    Follow Up Recommendations  No PT follow up     Equipment Recommendations  None recommended by PT;Rolling walker with 5" wheels    Recommendations for Other Services       Precautions / Restrictions Precautions Precautions: Fall Precaution Comments: monitor O2    Mobility  Bed Mobility Overal bed mobility: Modified Independent                  Transfers Overall transfer level: Independent                  Ambulation/Gait Ambulation/Gait assistance: Independent Gait Distance (Feet): 40 Feet (x 2) Assistive device: None Gait Pattern/deviations: Step-through pattern Gait velocity: decr   General Gait Details: gait is much Social worker    Modified Rankin (Stroke Patients Only)       Balance Overall balance assessment: Mild deficits observed, not formally tested                                          Cognition Arousal/Alertness: Awake/alert                                            Exercises      General Comments        Pertinent Vitals/Pain Pain Assessment: No/denies pain    Home Living                      Prior Function            PT Goals (current goals can now be found in the care plan section) Progress towards PT goals: Progressing toward goals    Frequency    Min 3X/week      PT Plan Current plan remains appropriate     Co-evaluation              AM-PAC PT "6 Clicks" Mobility   Outcome Measure  Help needed turning from your back to your side while in a flat bed without using bedrails?: None Help needed moving from lying on your back to sitting on the side of a flat bed without using bedrails?: None Help needed moving to and from a bed to a chair (including a wheelchair)?: None Help needed standing up from a chair using your arms (e.g., wheelchair or bedside chair)?: None Help needed to walk in hospital room?: None Help needed climbing 3-5 steps with a railing? : A Little 6 Click Score: 23    End of Session   Activity Tolerance: Patient tolerated treatment well Patient left: in bed;with call bell/phone within reach Nurse Communication: Mobility status PT Visit Diagnosis: Unsteadiness  on feet (R26.81);Difficulty in walking, not elsewhere classified (R26.2)     Time: [id number] PT Time Calculation (min) (ACUTE ONLY): 17 min  Charges:  $Gait Training: 8-22 mins                     Tresa Endo PT Acute Rehabilitation Services Pager [phone] Office [phone]    Claretha Cooper 09/30/2020, 3:57 PM

## 2020-09-30 NOTE — Plan of Care (Signed)
  Problem: Education: Goal: Knowledge of General Education information will improve Description: Including pain rating scale, medication(s)/side effects and non-pharmacologic comfort measures Outcome: Adequate for Discharge   Problem: Clinical Measurements: Goal: Diagnostic test results will improve Outcome: Adequate for Discharge Goal: Respiratory complications will improve Outcome: Adequate for Discharge   Problem: Activity: Goal: Risk for activity intolerance will decrease Outcome: Adequate for Discharge

## 2020-11-02 ENCOUNTER — Other Ambulatory Visit: Payer: Self-pay | Admitting: Internal Medicine

## 2020-11-11 ENCOUNTER — Ambulatory Visit: Payer: Medicare Other | Admitting: Obstetrics and Gynecology

## 2020-12-05 ENCOUNTER — Other Ambulatory Visit: Payer: Self-pay | Admitting: Internal Medicine

## 2021-01-08 ENCOUNTER — Encounter: Payer: Self-pay | Admitting: Internal Medicine

## 2021-01-08 NOTE — Progress Notes (Signed)
Subjective:    Patient ID: Kerry Powell, female    DOB: 08-27-1945, 75 y.o.   MRN: [id number]  This visit occurred during the SARS-CoV-2 public health emergency.  Safety protocols were in place, including screening questions prior to the visit, additional usage of staff PPE, and extensive cleaning of exam room while observing appropriate contact time as indicated for disinfecting solutions.     HPI The patient is here for follow up of their chronic medical problems, including DM, htn, B12 def, CKD, gout, gerd, depression, anxiety,  h/o CVA  She is taking all of her medications as prescribed.    She is worried about her kidneys.    She has not checked her sugars recently.   She has a lot of stress with her husband and son.   Medications and allergies reviewed with patient and updated if appropriate.  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 heart failure 01/20/2020   ESR raised 01/20/2020   Polyarthralgia 12/07/2019   Myalgia 12/07/2019   Anemia 12/07/2019   Fatigue 12/07/2019   CAD (coronary artery disease), native coronary artery    Pleural plaque 04/27/2019   Spasm of thoracic back muscle 11/14/2018   SOB (shortness of breath) 04/17/2018   Vitamin D deficiency 03/12/2018   Thoracic aortic aneurysm 02/06/2018   Sleep difficulties 09/09/2017   Depression 07/01/2017   Dizziness 06/10/2017   H/O renal cell carcinoma 05/23/2017   Hyperlipidemia 06/25/2016   Occipital neuralgia 05/09/2016   Anxiety 07/26/2015   Chronic neck pain 04/30/2015   Diabetes ([place]) 04/27/2015   History of CVA (cerebrovascular accident) 04/26/2015   Gout 04/26/2015   CKD (chronic kidney disease) 04/26/2015   Essential hypertension, benign  04/26/2015   Snoring 04/26/2015   Bilateral leg edema 04/26/2015   Dysphagia 04/26/2015   GERD (gastroesophageal reflux disease) 04/26/2015   Personal history of colonic adenoma 05/29/2007   HIATAL HERNIA 04/07/2007   RESTLESS LEG SYNDROME 04/04/2007   RHINOSINUSITIS, 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   COPD (chronic obstructive pulmonary disease) ([place]) 04/04/2007   IRRITABLE BOWEL SYNDROME 04/04/2007   OSTEOARTHRITIS 04/04/2007    Current Outpatient Medications on File Prior to Visit  Medication Sig Dispense Refill   ACETAMINOPHEN-BUTALBITAL 50-325 MG TABS TAKE 1 TABLET BY MOUTH EVERY DAY AS NEEDED 120 tablet 3   allopurinol (ZYLOPRIM) 300 MG tablet TAKE 1 TABLET BY MOUTH EVERY DAY 90 tablet 1   amLODipine (NORVASC) 5 MG tablet Take 1.5 tablets (7.5 mg total) by mouth daily. 135 tablet 1   cetirizine (ZYRTEC ALLERGY) 10 MG tablet Take 1 tablet (10 mg total) by mouth daily. 30 tablet 11   cholecalciferol (VITAMIN D3) 25 MCG (1000 UNIT) tablet Take 2,000 Units by mouth daily.     clopidogrel (PLAVIX) 75 MG tablet Take 1 tablet by mouth once daily 90 tablet 0   cyclobenzaprine (FLEXERIL) 5 MG tablet Take 1 tablet by mouth three times daily as needed for muscle spasm 90 tablet 1   DULoxetine (CYMBALTA) 30 MG capsule Take 1 capsule (30 mg total) by mouth daily. For total of 90 mg daily 90 capsule 1   DULoxetine (CYMBALTA) 60 MG capsule Take 1 capsule by mouth once daily 90 capsule 0   ferrous sulfate 325 (65 FE) MG tablet Take  325 mg by mouth daily with breakfast.     fluticasone (FLONASE) 50 MCG/ACT nasal spray Place 2 sprays into both nostrils daily. 11.1 mL 11   furosemide (LASIX) 20 MG tablet Take 20 mg by mouth 3 (three) times daily as needed for edema or fluid. 3 times a week as needed for swelling     ipratropium-albuterol (DUONEB) 0.5-2.5 (3) MG/3ML SOLN USE 1 VIAL IN NEBULIZER 4 TIMES DAILY. Generic: DUONEB 360 mL 10   metoprolol succinate (TOPROL-XL) 50 MG 24 hr tablet Take 50 mg by mouth daily.      montelukast (SINGULAIR) 10 MG tablet TAKE 1 TABLET BY MOUTH ONCE DAILY AT BEDTIME . APPOINTMENT REQUIRED FOR FUTURE REFILLS 30 tablet 4   pantoprazole (PROTONIX) 40 MG tablet Take 1 tablet (40 mg total) by mouth 2 (two) times daily before a meal. 180 tablet 1   Phenylephrine-DM-GG (MUCINEX FAST-MAX CONGEST COUGH PO) Take by mouth as needed.     rosuvastatin (CRESTOR) 40 MG tablet Take 1 tablet (40 mg total) by mouth daily. 90 tablet 3   telmisartan (MICARDIS) 40 MG tablet Take 40 mg by mouth daily.     umeclidinium-vilanterol (ANORO ELLIPTA) 62.5-25 MCG/INH AEPB Inhale 1 puff into the lungs daily. 60 each 5   metFORMIN (GLUCOPHAGE) 1000 MG tablet Take 1 tablet (1,000 mg total) by mouth daily with breakfast. 30 tablet 0   No current facility-administered medications on file prior to visit.    Past Medical History:  Diagnosis Date   Allergy    Anemia    Anxiety    Arthritis    BACK PAIN, CHRONIC 04/04/2007   Qualifier: Diagnosis of  By: Carlean Purl MD, FACG, [place] DISORDER 04/04/2007   Qualifier: History of  By: Carlean Purl MD, Tonna Boehringer E    CAD (coronary artery disease), native coronary artery    mild CAD with no obstructive disease by coronary CTA 06/2019   Cataract    removed both eyes   Chronic neck pain 04/30/2015   Cervical spondylosis [facility] Intermittent numbness and tingling in occipital region and headaches S/p 2 occipital nerve blocks - only temp relief no relief with gabapentin, ultram, brace MRI neck - facet arthrosis    CKD (chronic kidney disease) 04/26/2015   COPD (chronic obstructive pulmonary disease) ([place])    pt denies - told this while was smoking -   Cough 04/26/2015   Depression 07/01/2017   Diabetes ([place]) 04/27/2015   Diagnosed 03/2015    Dizziness 06/10/2017   Dysphagia 04/26/2015   DYSPNEA 12/24/2007   Qualifier: Diagnosis of  By: Glennis Brink     Elevated TSH 08/09/2015   GASTRIC ULCER, ACUTE 04/07/2007   Annotation: EGD 04/07/07  Qualifier: Diagnosis of  By: Carlean Purl MD, Tonna Boehringer E    GASTRITIS 04/07/2007   Annotation: EGD 04/07/07 Qualifier: Diagnosis of  By: Carlean Purl MD, Tonna Boehringer E    GERD (gastroesophageal reflux disease)    H/O renal cell carcinoma 05/23/2017   Dx 2010, s/p cryoablation Follows with urology - Dr Lovena Neighbours   HIATAL HERNIA 04/07/2007   Annotation: EGD 04/07/07 Qualifier: Diagnosis of  By: Carlean Purl MD, Tonna Boehringer E    History of CVA (cerebrovascular accident) 04/26/2015   History of gout 04/26/2015   Hyperlipidemia    Hypertension    Irritable bowel syndrome 04/04/2007   Qualifier: Diagnosis of  By: Carlean Purl MD, Dimas Millin    Kidney tumor (benign)    Nausea and vomiting  09/09/2017   Occipital neuralgia 05/09/2016   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 Dr Dimas Millin in [place] 04/04/2007   Qualifier: Diagnosis of  By: Carlean Purl MD, Dimas Millin    Personal history of colonic adenoma 06/07/2012   POSTNASAL DRIP SYNDROME 12/29/2007   Qualifier: Diagnosis of  By: Chase Caller MD, [name]     RESTLESS LEG SYNDROME 04/04/2007   Qualifier: Diagnosis of  By: Carlean Purl MD, FACG, Carl E    RHINOSINUSITIS, 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   Qualifier: Diagnosis of  By: Carlean Purl MD, Dimas Millin    Sleep difficulties 09/09/2017   Snoring 04/26/2015   Stroke ([place])    x2 2011   Thoracic aortic aneurysm 02/06/2018   Seen on MRI 01/25/18 of thoracic spine ordered by [facility] -- referred to CTS to monitor  -- 3.5 cm   TIA (transient ischemic attack)    TOBACCO ABUSE 12/17/2007   Qualifier: Diagnosis of  By: Chase Caller MD, [name]     Trigeminal neuralgia of right side of face 02/08/2016   URI (upper respiratory infection) 02/08/2016   Vitamin D deficiency 03/12/2018    Past Surgical History:  Procedure Laterality Date   ABDOMINAL HYSTERECTOMY     APPENDECTOMY     CATARACT EXTRACTION     COLONOSCOPY     CYSTOCELE REPAIR     ESOPHAGOGASTRODUODENOSCOPY     KIDNEY SURGERY     benign tumor removal   ORTHOPEDIC SURGERY      Wrist & elbow   POLYPECTOMY     RECTOCELE REPAIR     TONSILECTOMY, ADENOIDECTOMY, BILATERAL MYRINGOTOMY AND TUBES Bilateral 1968   UPPER GASTROINTESTINAL ENDOSCOPY      Social History   Socioeconomic History   Marital status: Married    Spouse name: Not on file   Number of children: 2   Years of education: Not on file   Highest education level: Not on file  Occupational History   Occupation: Retired  Tobacco Use   Smoking status: Former    Packs/day: 1.00    Years: 46.00    Pack years: 46.00    Types: Cigarettes    Quit date: 06/30/2009    Years since quitting: 11.5   Smokeless tobacco: Never  Vaping Use   Vaping Use: Never used  Substance and Sexual Activity   Alcohol use: No   Drug use: No   Sexual activity: Not on file  Other Topics Concern   Not on file  Social History Narrative   Married, retired   1 son 1 daughter   2 caffeine/day   Social Determinants of Health   Financial Resource Strain: Low Risk    Difficulty of Paying Living Expenses: Not hard at all  Food Insecurity: No Food Insecurity   Worried About Charity fundraiser in the Last Year: Never true   Arboriculturist in the Last Year: Never true  Transportation Needs: No Transportation Needs   Lack of Transportation (Medical): No   Lack of Transportation (Non-Medical): No  Physical Activity: Inactive   Days of Exercise per Week: 0 days   Minutes of Exercise per Session: 0 min  Stress: No Stress Concern Present   Feeling of Stress : Not at all  Social Connections: Moderately Isolated   Frequency of Communication with Friends and Family: More than three times a week   Frequency of Social Gatherings with Friends and Family: Once a week   Attends Religious Services: Never   Retail buyer of [employer]  or Organizations: No   Attends Archivist Meetings: Never   Marital Status: Married    Family History  Problem Relation Age of Onset   Congestive Heart Failure Mother    Cancer Mother         ovarian   Heart disease Father    Diabetes Father    Kidney disease Father    Diabetes Sister    Kidney disease Sister    Colon cancer Neg Hx    Colon polyps Neg Hx    Esophageal cancer Neg Hx    Rectal cancer Neg Hx    Stomach cancer Neg Hx     Review of Systems  Constitutional:  Negative for fever.  Respiratory:  Negative for cough, shortness of breath and wheezing.   Cardiovascular:  Positive for leg swelling (occ - LLE more than RLE). Negative for chest pain and palpitations.  Musculoskeletal:  Positive for neck pain.  Neurological:  Negative for light-headedness and headaches.      Objective:   Vitals:   01/09/21 1509  BP: 124/84  Pulse: (!) 102  Temp: 98.1 F (36.7 C)  SpO2: 94%   BP Readings from Last 3 Encounters:  01/09/21 124/84  09/30/20 129/82  08/03/20 120/80   Wt Readings from Last 3 Encounters:  01/09/21 224 lb (101.6 kg)  09/30/20 225 lb 12 oz (102.4 kg)  08/03/20 232 lb (105.2 kg)   Body mass index is 37.28 kg/m.   Physical Exam    Constitutional: Appears well-developed and well-nourished. No distress.  HENT:  Head: Normocephalic and atraumatic.  Neck: Neck supple. No tracheal deviation present. No thyromegaly present.  No cervical lymphadenopathy Cardiovascular: Normal rate, regular rhythm and normal heart sounds.   No murmur heard. No carotid bruit .  No edema Pulmonary/Chest: Effort normal and breath sounds normal. No respiratory distress. No has no wheezes. No rales.  Skin: Skin is warm and dry. Not diaphoretic.  Psychiatric: Normal mood and affect. Behavior is normal.      Assessment & Plan:    See Problem List for Assessment and Plan of chronic medical problems.

## 2021-01-08 NOTE — Patient Instructions (Addendum)
     Blood work was ordered.       Medications changes include :   none      Please followup in 4 months

## 2021-01-09 ENCOUNTER — Other Ambulatory Visit: Payer: Self-pay

## 2021-01-09 ENCOUNTER — Ambulatory Visit: Payer: Medicare Other | Admitting: Internal Medicine

## 2021-01-09 VITALS — BP 124/84 | HR 102 | Temp 98.1°F | Ht 65.0 in | Wt 224.0 lb

## 2021-01-09 DIAGNOSIS — E1122 Type 2 diabetes mellitus with diabetic chronic kidney disease: Secondary | ICD-10-CM

## 2021-01-09 DIAGNOSIS — E782 Mixed hyperlipidemia: Secondary | ICD-10-CM

## 2021-01-09 DIAGNOSIS — N1831 Chronic kidney disease, stage 3a: Secondary | ICD-10-CM

## 2021-01-09 DIAGNOSIS — M1A30X0 Chronic gout due to renal impairment, unspecified site, without tophus (tophi): Secondary | ICD-10-CM

## 2021-01-09 DIAGNOSIS — E538 Deficiency of other specified B group vitamins: Secondary | ICD-10-CM

## 2021-01-09 DIAGNOSIS — F3289 Other specified depressive episodes: Secondary | ICD-10-CM

## 2021-01-09 DIAGNOSIS — F419 Anxiety disorder, unspecified: Secondary | ICD-10-CM

## 2021-01-09 DIAGNOSIS — K219 Gastro-esophageal reflux disease without esophagitis: Secondary | ICD-10-CM | POA: Diagnosis not present

## 2021-01-09 DIAGNOSIS — N1832 Chronic kidney disease, stage 3b: Secondary | ICD-10-CM

## 2021-01-09 DIAGNOSIS — I10 Essential (primary) hypertension: Secondary | ICD-10-CM | POA: Diagnosis not present

## 2021-01-09 MED ORDER — CYANOCOBALAMIN 1000 MCG/ML IJ SOLN
1000.0000 ug | Freq: Once | INTRAMUSCULAR | Status: AC
  Administered 2021-01-09: 1000 ug via INTRAMUSCULAR

## 2021-01-09 NOTE — Assessment & Plan Note (Signed)
Chronic Getting monthly B12 injection - will give one today Continue monthly B12 injections

## 2021-01-09 NOTE — Assessment & Plan Note (Signed)
Chronic Controlled, stable Continue cymbalta 90 mg daily  

## 2021-01-09 NOTE — Assessment & Plan Note (Signed)
Chronic Anxiety level is high Continue cymbalta 90 mg daily

## 2021-01-09 NOTE — Assessment & Plan Note (Signed)
Chronic Check lipid panel  Continue crestor 40 mg daily Regular exercise and healthy diet encouraged

## 2021-01-09 NOTE — Assessment & Plan Note (Signed)
Chronic BP well controlled Continue amlodipine 7.5 mg daily, metoprolol xl 50 mg daily, telmisartan 40 mg daily cmp

## 2021-01-09 NOTE — Assessment & Plan Note (Addendum)
Chronic Has not seen nephrology in a while - needs to go back for an appointment - GFR has been stable GFR

## 2021-01-09 NOTE — Assessment & Plan Note (Signed)
Chronic Controlled, stable - no episodes of gout Continue allopurinol 300 mg daily

## 2021-01-09 NOTE — Assessment & Plan Note (Signed)
Chronic Lab Results  Component Value Date   HGBA1C 7.4 (H) 08/01/2020   Check a1c today Continue metformin 1000 mg daily with breakfast Will likely need to adjust medication - will see what a1c is

## 2021-01-09 NOTE — Assessment & Plan Note (Signed)
Chronic GERD controlled Continue pantoprazole 40 mg bid  

## 2021-01-10 LAB — COMPREHENSIVE METABOLIC PANEL
ALT: 17 U/L (ref 0–35)
AST: 19 U/L (ref 0–37)
Albumin: 4.6 g/dL (ref 3.5–5.2)
Alkaline Phosphatase: 95 U/L (ref 39–117)
BUN: 18 mg/dL (ref 6–23)
CO2: 30 mEq/L (ref 19–32)
Calcium: 9.6 mg/dL (ref 8.4–10.5)
Chloride: 101 mEq/L (ref 96–112)
Creatinine, Ser: 1.54 mg/dL — ABNORMAL HIGH (ref 0.40–1.20)
GFR: 32.75 mL/min — ABNORMAL LOW (ref >60.00)
Glucose, Bld: 100 mg/dL — ABNORMAL HIGH (ref 70–99)
Potassium: 5.4 mEq/L — ABNORMAL HIGH (ref 3.5–5.1)
Sodium: 137 mEq/L (ref 135–145)
Total Bilirubin: 0.6 mg/dL (ref 0.2–1.2)
Total Protein: 7.3 g/dL (ref 6.0–8.3)

## 2021-01-10 LAB — CBC WITH DIFFERENTIAL/PLATELET
Basophils Absolute: 0.1 10*3/uL (ref 0.0–0.1)
Basophils Relative: 0.9 % (ref 0.0–3.0)
Eosinophils Absolute: 0.2 10*3/uL (ref 0.0–0.7)
Eosinophils Relative: 2.9 % (ref 0.0–5.0)
HCT: 44.4 % (ref 36.0–46.0)
Hemoglobin: 14.4 g/dL (ref 12.0–15.0)
Lymphocytes Relative: 21.3 % (ref 12.0–46.0)
Lymphs Abs: 1.7 10*3/uL (ref 0.7–4.0)
MCHC: 32.4 g/dL (ref 30.0–36.0)
MCV: 96.5 fl (ref 78.0–100.0)
Monocytes Absolute: 0.8 10*3/uL (ref 0.1–1.0)
Monocytes Relative: 9.6 % (ref 3.0–12.0)
Neutro Abs: 5.1 10*3/uL (ref 1.4–7.7)
Neutrophils Relative %: 65.3 % (ref 43.0–77.0)
Platelets: 273 10*3/uL (ref 150.0–400.0)
RBC: 4.6 Mil/uL (ref 3.87–5.11)
RDW: 13.7 % (ref 11.5–15.5)
WBC: 7.9 10*3/uL (ref 4.0–10.5)

## 2021-01-10 LAB — LIPID PANEL
Cholesterol: 137 mg/dL (ref 0–200)
HDL: 40.3 mg/dL (ref >39.00)
LDL Cholesterol: 58 mg/dL (ref 0–99)
NonHDL: 96.97
Total CHOL/HDL Ratio: 3
Triglycerides: 194 mg/dL — ABNORMAL HIGH (ref 0.0–149.0)
VLDL: 38.8 mg/dL (ref 0.0–40.0)

## 2021-01-10 LAB — HEMOGLOBIN A1C: Hgb A1c MFr Bld: 7.8 % — ABNORMAL HIGH (ref 4.6–6.5)

## 2021-01-11 MED ORDER — RYBELSUS 3 MG PO TABS: 3.0000 mg | ORAL_TABLET | Freq: Every day | ORAL | 5 refills | Status: DC

## 2021-01-11 NOTE — Addendum Note (Signed)
Addended by: Binnie Rail on: 01/11/2021 07:34 AM   Modules accepted: Orders

## 2021-01-21 ENCOUNTER — Encounter: Payer: Self-pay | Admitting: Internal Medicine

## 2021-01-22 ENCOUNTER — Other Ambulatory Visit: Payer: Self-pay | Admitting: Internal Medicine

## 2021-01-24 MED ORDER — BUTALBITAL-ACETAMINOPHEN 50-325 MG PO TABS: ORAL_TABLET | ORAL | 3 refills | Status: DC

## 2021-02-08 ENCOUNTER — Encounter: Payer: Self-pay | Admitting: Internal Medicine

## 2021-03-04 ENCOUNTER — Other Ambulatory Visit: Payer: Self-pay | Admitting: Internal Medicine

## 2021-03-13 ENCOUNTER — Other Ambulatory Visit: Payer: Self-pay | Admitting: Internal Medicine

## 2021-03-20 ENCOUNTER — Other Ambulatory Visit: Payer: Self-pay | Admitting: Internal Medicine

## 2021-03-21 ENCOUNTER — Other Ambulatory Visit: Payer: Self-pay

## 2021-03-21 ENCOUNTER — Encounter: Payer: Self-pay | Admitting: Internal Medicine

## 2021-03-21 MED ORDER — MONTELUKAST SODIUM 10 MG PO TABS: ORAL_TABLET | ORAL | 4 refills | Status: DC

## 2021-04-17 NOTE — Progress Notes (Signed)
Subjective:    Patient ID: Kerry Powell, female    DOB: [birth year], 76 y.o.   MRN: [id number]  This visit occurred during the SARS-CoV-2 public health emergency.  Safety protocols were in place, including screening questions prior to the visit, additional usage of staff PPE, and extensive cleaning of exam room while observing appropriate contact time as indicated for disinfecting solutions.    HPI She is here for an acute visit for cold symptoms.    Her symptoms started 2 weeks ago.    She is experiencing   She has tried taking her neb treatments a few times a day - it helps temporarily.    Covid test neg 2/18     Medications and allergies reviewed with patient and updated if appropriate.  Patient Active Problem List   Diagnosis Date Noted   COPD with acute exacerbation ([place]) 04/18/2021   Acute respiratory failure with hypoxia ([place]) 09/26/2020   B12 deficiency 08/03/2020   Acute sinus infection 07/29/2020   Diabetic polyneuropathy associated with type 2 diabetes mellitus ([place]) 46/27/0350   Diastolic dysfunction without heart failure 01/20/2020   ESR raised 01/20/2020   Polyarthralgia 12/07/2019   Myalgia 12/07/2019   Anemia 12/07/2019   Fatigue 12/07/2019   CAD (coronary artery disease), native coronary artery    Pleural plaque 04/27/2019   Spasm of thoracic back muscle 11/14/2018   SOB (shortness of breath) 04/17/2018   Vitamin D deficiency 03/12/2018   Thoracic aortic aneurysm 02/06/2018   Sleep difficulties 09/09/2017   Depression 07/01/2017   Dizziness 06/10/2017   H/O renal cell carcinoma 05/23/2017   Hyperlipidemia 06/25/2016   Occipital neuralgia 05/09/2016   Anxiety 07/26/2015   Chronic neck pain 04/30/2015   Diabetes ([place]) 04/27/2015   History of CVA (cerebrovascular accident) 04/26/2015   Gout 04/26/2015   CKD (chronic kidney disease) 04/26/2015   Essential hypertension, benign 04/26/2015   Snoring 04/26/2015   Bilateral leg edema 04/26/2015    Dysphagia 04/26/2015   GERD (gastroesophageal reflux disease) 04/26/2015   Personal history of colonic adenoma 05/29/2007   HIATAL HERNIA 04/07/2007   RESTLESS LEG SYNDROME 04/04/2007   RHINOSINUSITIS, 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   COPD (chronic obstructive pulmonary disease) ([place]) 04/04/2007   IRRITABLE BOWEL SYNDROME 04/04/2007   OSTEOARTHRITIS 04/04/2007    Current Outpatient Medications on File Prior to Visit  Medication Sig Dispense Refill   ACETAMINOPHEN-BUTALBITAL 50-325 MG TABS TAKE 1 TABLET BY MOUTH EVERY DAY AS NEEDED 60 tablet 3   allopurinol (ZYLOPRIM) 300 MG tablet TAKE 1 TABLET BY MOUTH EVERY DAY 90 tablet 1   amLODipine (NORVASC) 5 MG tablet Take 1.5 tablets (7.5 mg total) by mouth daily. 135 tablet 1   cetirizine (ZYRTEC ALLERGY) 10 MG tablet Take 1 tablet (10 mg total) by mouth daily. 30 tablet 11   cholecalciferol (VITAMIN D3) 25 MCG (1000 UNIT) tablet Take 2,000 Units by mouth daily.     clopidogrel (PLAVIX) 75 MG tablet Take 1 tablet by mouth once daily 90 tablet 0   cyclobenzaprine (FLEXERIL) 5 MG tablet Take 1 tablet by mouth three times daily as needed for muscle spasm 90 tablet 1   DULoxetine (CYMBALTA) 30 MG capsule Take 1 capsule (30 mg total) by mouth daily. For total of 90 mg daily 90 capsule 1   DULoxetine (CYMBALTA) 60 MG capsule Take 1 capsule by mouth once daily 90 capsule 0   ferrous sulfate 325 (65 FE) MG tablet Take 325 mg by mouth daily with breakfast.  fluticasone (FLONASE) 50 MCG/ACT nasal spray Place 2 sprays into both nostrils daily. 11.1 mL 11   furosemide (LASIX) 20 MG tablet Take 20 mg by mouth 3 (three) times daily as needed for edema or fluid. 3 times a week as needed for swelling     ipratropium-albuterol (DUONEB) 0.5-2.5 (3) MG/3ML SOLN USE 1 VIAL IN NEBULIZER 4 TIMES DAILY. Generic: DUONEB 360 mL 10   metoprolol succinate (TOPROL-XL) 50 MG 24 hr tablet Take 50 mg by mouth daily.     montelukast (SINGULAIR) 10 MG tablet TAKE 1 TABLET BY MOUTH  ONCE DAILY AT BEDTIME 30 tablet 4   pantoprazole (PROTONIX) 40 MG tablet TAKE 1 TABLET BY MOUTH TWICE DAILY BEFORE A MEAL 180 tablet 0   Phenylephrine-DM-GG (MUCINEX FAST-MAX CONGEST COUGH PO) Take by mouth as needed.     rosuvastatin (CRESTOR) 40 MG tablet Take 1 tablet (40 mg total) by mouth daily. 90 tablet 3   Semaglutide (RYBELSUS) 3 MG TABS Take 3 mg by mouth daily. 30 tablet 5   telmisartan (MICARDIS) 40 MG tablet Take 40 mg by mouth daily.     telmisartan (MICARDIS) 80 MG tablet Take 80 mg by mouth daily.     umeclidinium-vilanterol (ANORO ELLIPTA) 62.5-25 MCG/INH AEPB Inhale 1 puff into the lungs daily. 60 each 5   FARXIGA 10 MG TABS tablet Take 10 mg by mouth daily.     metFORMIN (GLUCOPHAGE) 1000 MG tablet Take 1 tablet (1,000 mg total) by mouth daily with breakfast. 30 tablet 0   No current facility-administered medications on file prior to visit.    Past Medical History:  Diagnosis Date   Allergy    Anemia    Anxiety    Arthritis    BACK PAIN, CHRONIC 04/04/2007   Qualifier: Diagnosis of  By: Carlean Purl MD, FACG, [place] DISORDER 04/04/2007   Qualifier: History of  By: Carlean Purl MD, Tonna Boehringer E    CAD (coronary artery disease), native coronary artery    mild CAD with no obstructive disease by coronary CTA 06/2019   Cataract    removed both eyes   Chronic neck pain 04/30/2015   Cervical spondylosis [facility] Intermittent numbness and tingling in occipital region and headaches S/p 2 occipital nerve blocks - only temp relief no relief with gabapentin, ultram, brace MRI neck - facet arthrosis    CKD (chronic kidney disease) 04/26/2015   COPD (chronic obstructive pulmonary disease) ([place])    pt denies - told this while was smoking -   Cough 04/26/2015   Depression 07/01/2017   Diabetes ([place]) 04/27/2015   Diagnosed 03/2015    Dizziness 06/10/2017   Dysphagia 04/26/2015   DYSPNEA 12/24/2007   Qualifier: Diagnosis of  By: Glennis Brink     Elevated TSH  08/09/2015   GASTRIC ULCER, ACUTE 04/07/2007   Annotation: EGD 04/07/07 Qualifier: Diagnosis of  By: Carlean Purl MD, Tonna Boehringer E    GASTRITIS 04/07/2007   Annotation: EGD 04/07/07 Qualifier: Diagnosis of  By: Carlean Purl MD, Tonna Boehringer E    GERD (gastroesophageal reflux disease)    H/O renal cell carcinoma 05/23/2017   Dx 2010, s/p cryoablation Follows with urology - Dr Lovena Neighbours   HIATAL HERNIA 04/07/2007   Annotation: EGD 04/07/07 Qualifier: Diagnosis of  By: Carlean Purl MD, Tonna Boehringer E    History of CVA (cerebrovascular accident) 04/26/2015   History of gout 04/26/2015   Hyperlipidemia    Hypertension    Irritable bowel syndrome 04/04/2007  Qualifier: Diagnosis of  By: Carlean Purl MD, Dimas Millin    Kidney tumor (benign)    Nausea and vomiting 09/09/2017   Occipital neuralgia 05/09/2016   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 Dr Dimas Millin in [place] 04/04/2007   Qualifier: Diagnosis of  By: Carlean Purl MD, Dimas Millin    Personal history of colonic adenoma 06/07/2012   POSTNASAL DRIP SYNDROME 12/29/2007   Qualifier: Diagnosis of  By: Chase Caller MD, [name]     RESTLESS LEG SYNDROME 04/04/2007   Qualifier: Diagnosis of  By: Carlean Purl MD, FACG, Carl E    RHINOSINUSITIS, 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   Qualifier: Diagnosis of  By: Carlean Purl MD, Tonna Boehringer E    Sleep difficulties 09/09/2017   Snoring 04/26/2015   Stroke ([place])    x2 2011   Thoracic aortic aneurysm 02/06/2018   Seen on MRI 01/25/18 of thoracic spine ordered by [facility] -- referred to CTS to monitor  -- 3.5 cm   TIA (transient ischemic attack)    TOBACCO ABUSE 12/17/2007   Qualifier: Diagnosis of  By: Chase Caller MD, [name]     Trigeminal neuralgia of right side of face 02/08/2016   URI (upper respiratory infection) 02/08/2016   Vitamin D deficiency 03/12/2018    Past Surgical History:  Procedure Laterality Date   ABDOMINAL HYSTERECTOMY     APPENDECTOMY     CATARACT EXTRACTION     COLONOSCOPY     CYSTOCELE REPAIR     ESOPHAGOGASTRODUODENOSCOPY      KIDNEY SURGERY     benign tumor removal   ORTHOPEDIC SURGERY     Wrist & elbow   POLYPECTOMY     RECTOCELE REPAIR     TONSILECTOMY, ADENOIDECTOMY, BILATERAL MYRINGOTOMY AND TUBES Bilateral 1968   UPPER GASTROINTESTINAL ENDOSCOPY      Social History   Socioeconomic History   Marital status: Married    Spouse name: Not on file   Number of children: 2   Years of education: Not on file   Highest education level: Not on file  Occupational History   Occupation: Retired  Tobacco Use   Smoking status: Former    Packs/day: 1.00    Years: 46.00    Pack years: 46.00    Types: Cigarettes    Quit date: 06/30/2009    Years since quitting: 11.8   Smokeless tobacco: Never  Vaping Use   Vaping Use: Never used  Substance and Sexual Activity   Alcohol use: No   Drug use: No   Sexual activity: Not on file  Other Topics Concern   Not on file  Social History Narrative   Married, retired   1 son 1 daughter   2 caffeine/day   Social Determinants of Health   Financial Resource Strain: Not on file  Food Insecurity: Not on file  Transportation Needs: Not on file  Physical Activity: Not on file  Stress: Not on file  Social Connections: Not on file    Family History  Problem Relation Age of Onset   Congestive Heart Failure Mother    Cancer Mother        ovarian   Heart disease Father    Diabetes Father    Kidney disease Father    Diabetes Sister    Kidney disease Sister    Colon cancer Neg Hx    Colon polyps Neg Hx    Esophageal cancer Neg Hx    Rectal cancer Neg Hx    Stomach cancer Neg Hx  Review of Systems  Constitutional:  Positive for fever (low grade).  HENT:  Positive for congestion, postnasal drip and sore throat (better). Negative for ear pain, sinus pressure and sinus pain.   Respiratory:  Positive for cough (deep cough - mostly dry, occ brings up sputum), chest tightness, shortness of breath and wheezing.   Neurological:  Negative for dizziness and headaches.       Objective:   Vitals:   04/18/21 1431  BP: 116/80  Pulse: (!) 103  Temp: 98.6 F (37 C)  SpO2: 95%   BP Readings from Last 3 Encounters:  04/18/21 116/80  01/09/21 124/84  09/30/20 129/82   Wt Readings from Last 3 Encounters:  04/18/21 220 lb 3.2 oz (99.9 kg)  01/09/21 224 lb (101.6 kg)  09/30/20 225 lb 12 oz (102.4 kg)   Body mass index is 36.64 kg/m.   Physical Exam         Assessment & Plan:    See Problem List for Assessment and Plan of chronic medical problems.

## 2021-04-18 ENCOUNTER — Other Ambulatory Visit: Payer: Self-pay

## 2021-04-18 ENCOUNTER — Ambulatory Visit (INDEPENDENT_AMBULATORY_CARE_PROVIDER_SITE_OTHER): Payer: Medicare Other | Admitting: Internal Medicine

## 2021-04-18 ENCOUNTER — Ambulatory Visit (INDEPENDENT_AMBULATORY_CARE_PROVIDER_SITE_OTHER): Payer: Medicare Other

## 2021-04-18 ENCOUNTER — Encounter: Payer: Self-pay | Admitting: Internal Medicine

## 2021-04-18 VITALS — BP 116/80 | HR 103 | Temp 98.6°F | Ht 65.0 in | Wt 220.2 lb

## 2021-04-18 DIAGNOSIS — E538 Deficiency of other specified B group vitamins: Secondary | ICD-10-CM | POA: Diagnosis not present

## 2021-04-18 DIAGNOSIS — J441 Chronic obstructive pulmonary disease with (acute) exacerbation: Secondary | ICD-10-CM

## 2021-04-18 DIAGNOSIS — J069 Acute upper respiratory infection, unspecified: Secondary | ICD-10-CM | POA: Diagnosis not present

## 2021-04-18 MED ORDER — CYANOCOBALAMIN 1000 MCG/ML IJ SOLN
1000.0000 ug | Freq: Once | INTRAMUSCULAR | Status: AC
  Administered 2021-04-18: 1000 ug via INTRAMUSCULAR

## 2021-04-18 MED ORDER — PREDNISONE 10 MG PO TABS: ORAL_TABLET | ORAL | 0 refills | Status: DC

## 2021-04-18 MED ORDER — DOXYCYCLINE HYCLATE 100 MG PO TABS: 100.0000 mg | ORAL_TABLET | Freq: Two times a day (BID) | ORAL | 0 refills | Status: AC

## 2021-04-18 MED ORDER — HYDROCODONE BIT-HOMATROP MBR 5-1.5 MG/5ML PO SOLN: 5.0000 mL | Freq: Three times a day (TID) | ORAL | 0 refills | Status: DC | PRN

## 2021-04-18 NOTE — Assessment & Plan Note (Signed)
Chronic Monthly B12 injections Injection today

## 2021-04-18 NOTE — Addendum Note (Signed)
Addended by: Marcina Millard on: 04/18/2021 03:47 PM   Modules accepted: Orders

## 2021-04-18 NOTE — Patient Instructions (Addendum)
° ° ° °  Chest xray was ordered - have this done downstairs.       Medications changes include :   cough syrup,  prednisone taper, doxycycline 100 mg twice daily for 10 days     Your prescription(s) have been sent to your pharmacy.    Return if symptoms worsen or fail to improve.

## 2021-04-18 NOTE — Assessment & Plan Note (Signed)
Acute URI with copd exacerbation cxr today Doxycyline 100 mg bid x 10 days Prednisone taper Hycodan cough syrup Continue neb treatment Rest, fluids Call if no improvement

## 2021-04-21 ENCOUNTER — Encounter: Payer: Self-pay | Admitting: Internal Medicine

## 2021-05-01 ENCOUNTER — Other Ambulatory Visit: Payer: Self-pay | Admitting: Internal Medicine

## 2021-05-03 DIAGNOSIS — M1712 Unilateral primary osteoarthritis, left knee: Secondary | ICD-10-CM | POA: Insufficient documentation

## 2021-05-04 DIAGNOSIS — M170 Bilateral primary osteoarthritis of knee: Secondary | ICD-10-CM | POA: Insufficient documentation

## 2021-05-04 DIAGNOSIS — M25561 Pain in right knee: Secondary | ICD-10-CM | POA: Insufficient documentation

## 2021-05-07 ENCOUNTER — Encounter: Payer: Self-pay | Admitting: Internal Medicine

## 2021-05-07 NOTE — Patient Instructions (Signed)
° ° ° °  Blood work was ordered.     Medications changes include :      Your prescription(s) have been sent to your pharmacy.    A referral was ordered for XX.     Someone from that office will call you to schedule an appointment.    No follow-ups on file.

## 2021-05-07 NOTE — Progress Notes (Unsigned)
Subjective:    Patient ID: Kerry Powell, female    DOB: Oct 19, 1945, 76 y.o.   MRN: [id number]  This visit occurred during the SARS-CoV-2 public health emergency.  Safety protocols were in place, including screening questions prior to the visit, additional usage of staff PPE, and extensive cleaning of exam room while observing appropriate contact time as indicated for disinfecting solutions.     HPI Margaret is here for follow up of her chronic medical problems, including DM, htn, B12 def, CKD, gout, gerd, depression, anxiety, h/o CVA  Taking rybelsus - ?  Inc - dose of micardis?  Medications and allergies reviewed with patient and updated if appropriate.  Current Outpatient Medications on File Prior to Visit  Medication Sig Dispense Refill   ACETAMINOPHEN-BUTALBITAL 50-325 MG TABS TAKE 1 TABLET BY MOUTH EVERY DAY AS NEEDED 60 tablet 3   allopurinol (ZYLOPRIM) 300 MG tablet TAKE 1 TABLET BY MOUTH EVERY DAY 90 tablet 1   amLODipine (NORVASC) 5 MG tablet Take 1.5 tablets (7.5 mg total) by mouth daily. 135 tablet 1   cetirizine (ZYRTEC ALLERGY) 10 MG tablet Take 1 tablet (10 mg total) by mouth daily. 30 tablet 11   cholecalciferol (VITAMIN D3) 25 MCG (1000 UNIT) tablet Take 2,000 Units by mouth daily.     clopidogrel (PLAVIX) 75 MG tablet Take 1 tablet by mouth once daily 90 tablet 0   cyclobenzaprine (FLEXERIL) 5 MG tablet Take 1 tablet by mouth three times daily as needed for muscle spasm 90 tablet 1   DULoxetine (CYMBALTA) 30 MG capsule Take 1 capsule by mouth once daily 90 capsule 0   DULoxetine (CYMBALTA) 60 MG capsule Take 1 capsule by mouth once daily 90 capsule 0   FARXIGA 10 MG TABS tablet Take 10 mg by mouth daily.     ferrous sulfate 325 (65 FE) MG tablet Take 325 mg by mouth daily with breakfast.     fluticasone (FLONASE) 50 MCG/ACT nasal spray Place 2 sprays into both nostrils daily. 11.1 mL 11   furosemide (LASIX) 20 MG tablet Take 20 mg by mouth 3 (three) times daily as  needed for edema or fluid. 3 times a week as needed for swelling     HYDROcodone bit-homatropine (HYCODAN) 5-1.5 MG/5ML syrup Take 5 mLs by mouth every 8 (eight) hours as needed for cough. 120 mL 0   ipratropium-albuterol (DUONEB) 0.5-2.5 (3) MG/3ML SOLN USE 1 VIAL IN NEBULIZER 4 TIMES DAILY. Generic: DUONEB 360 mL 10   metFORMIN (GLUCOPHAGE) 1000 MG tablet Take 1 tablet (1,000 mg total) by mouth daily with breakfast. 30 tablet 0   metoprolol succinate (TOPROL-XL) 50 MG 24 hr tablet Take 50 mg by mouth daily.     montelukast (SINGULAIR) 10 MG tablet TAKE 1 TABLET BY MOUTH ONCE DAILY AT BEDTIME 30 tablet 4   pantoprazole (PROTONIX) 40 MG tablet TAKE 1 TABLET BY MOUTH TWICE DAILY BEFORE A MEAL 180 tablet 0   Phenylephrine-DM-GG (MUCINEX FAST-MAX CONGEST COUGH PO) Take by mouth as needed.     predniSONE (DELTASONE) 10 MG tablet Take 4 tabs po qd x 3 days, then 3 tabs po qd x 3 days, then 2 tabs po qd x 3 days, then 1 tab po qd x 3 days 30 tablet 0   rosuvastatin (CRESTOR) 40 MG tablet Take 1 tablet (40 mg total) by mouth daily. 90 tablet 3   Semaglutide (RYBELSUS) 3 MG TABS Take 3 mg by mouth daily. 30 tablet 5  telmisartan (MICARDIS) 40 MG tablet Take 40 mg by mouth daily.     telmisartan (MICARDIS) 80 MG tablet Take 80 mg by mouth daily.     umeclidinium-vilanterol (ANORO ELLIPTA) 62.5-25 MCG/INH AEPB Inhale 1 puff into the lungs daily. 60 each 5   No current facility-administered medications on file prior to visit.     Review of Systems     Objective:  There were no vitals filed for this visit. BP Readings from Last 3 Encounters:  04/18/21 116/80  01/09/21 124/84  09/30/20 129/82   Wt Readings from Last 3 Encounters:  04/18/21 220 lb 3.2 oz (99.9 kg)  01/09/21 224 lb (101.6 kg)  09/30/20 225 lb 12 oz (102.4 kg)   There is no height or weight on file to calculate BMI.    Physical Exam     Lab Results  Component Value Date   WBC 7.9 01/09/2021   HGB 14.4 01/09/2021   HCT  44.4 01/09/2021   PLT 273.0 01/09/2021   GLUCOSE 100 (H) 01/09/2021   CHOL 137 01/09/2021   TRIG 194.0 (H) 01/09/2021   HDL 40.30 01/09/2021   LDLDIRECT 79.0 08/01/2020   LDLCALC 58 01/09/2021   ALT 17 01/09/2021   AST 19 01/09/2021   NA 137 01/09/2021   K 5.4 (H) 01/09/2021   CL 101 01/09/2021   CREATININE 1.54 (H) 01/09/2021   BUN 18 01/09/2021   CO2 30 01/09/2021   TSH 3.48 08/01/2020   INR 0.91 06/30/2009   HGBA1C 7.8 (H) 01/09/2021     Assessment & Plan:    See Problem List for Assessment and Plan of chronic medical problems.

## 2021-05-10 ENCOUNTER — Ambulatory Visit: Payer: Medicare Other

## 2021-05-10 ENCOUNTER — Ambulatory Visit: Payer: Medicare Other | Admitting: Internal Medicine

## 2021-05-10 ENCOUNTER — Other Ambulatory Visit: Payer: Self-pay

## 2021-05-10 VITALS — BP 108/78 | HR 103 | Temp 98.4°F | Ht 65.0 in | Wt 224.0 lb

## 2021-05-10 DIAGNOSIS — M1A30X0 Chronic gout due to renal impairment, unspecified site, without tophus (tophi): Secondary | ICD-10-CM

## 2021-05-10 DIAGNOSIS — I10 Essential (primary) hypertension: Secondary | ICD-10-CM

## 2021-05-10 DIAGNOSIS — F3289 Other specified depressive episodes: Secondary | ICD-10-CM

## 2021-05-10 DIAGNOSIS — R1310 Dysphagia, unspecified: Secondary | ICD-10-CM

## 2021-05-10 DIAGNOSIS — E1122 Type 2 diabetes mellitus with diabetic chronic kidney disease: Secondary | ICD-10-CM | POA: Diagnosis not present

## 2021-05-10 DIAGNOSIS — N1831 Chronic kidney disease, stage 3a: Secondary | ICD-10-CM

## 2021-05-10 DIAGNOSIS — E538 Deficiency of other specified B group vitamins: Secondary | ICD-10-CM

## 2021-05-10 DIAGNOSIS — F419 Anxiety disorder, unspecified: Secondary | ICD-10-CM | POA: Diagnosis not present

## 2021-05-10 DIAGNOSIS — N1832 Chronic kidney disease, stage 3b: Secondary | ICD-10-CM

## 2021-05-10 LAB — COMPREHENSIVE METABOLIC PANEL
ALT: 14 U/L (ref 0–35)
AST: 15 U/L (ref 0–37)
Albumin: 4.4 g/dL (ref 3.5–5.2)
Alkaline Phosphatase: 103 U/L (ref 39–117)
BUN: 21 mg/dL (ref 6–23)
CO2: 30 mEq/L (ref 19–32)
Calcium: 9.8 mg/dL (ref 8.4–10.5)
Chloride: 101 mEq/L (ref 96–112)
Creatinine, Ser: 1.77 mg/dL — ABNORMAL HIGH (ref 0.40–1.20)
GFR: 27.65 mL/min — ABNORMAL LOW (ref >60.00)
Glucose, Bld: 129 mg/dL — ABNORMAL HIGH (ref 70–99)
Potassium: 4.3 mEq/L (ref 3.5–5.1)
Sodium: 136 mEq/L (ref 135–145)
Total Bilirubin: 0.4 mg/dL (ref 0.2–1.2)
Total Protein: 7 g/dL (ref 6.0–8.3)

## 2021-05-10 LAB — HEMOGLOBIN A1C: Hgb A1c MFr Bld: 7.4 % — ABNORMAL HIGH (ref 4.6–6.5)

## 2021-05-10 MED ORDER — TELMISARTAN 80 MG PO TABS: 80.0000 mg | ORAL_TABLET | Freq: Every day | ORAL | Status: DC

## 2021-05-10 NOTE — Assessment & Plan Note (Signed)
Chronic ?Controlled, Stable ?Continue duloxetine 90 mg daily ?

## 2021-05-10 NOTE — Assessment & Plan Note (Signed)
She is having some difficulty swallowing at times ?Has history of a stricture-discussed dilated ?Discussed contacting GI if her symptoms worsen ?

## 2021-05-10 NOTE — Assessment & Plan Note (Signed)
Chronic ?On Farxiga 10 mg daily ?Following with Dr. Royce Macadamia ?

## 2021-05-10 NOTE — Assessment & Plan Note (Addendum)
Chronic ?BP well controlled-slightly on the low side, but this is the first time its been that low and she is asymptomatic so we will just monitor-no changes today ?Continue amlodipine 7.5 mg daily, metoprolol xl 50 mg daily, telmisartin 80 mg daily ?cmp ? ?

## 2021-05-10 NOTE — Assessment & Plan Note (Signed)
Chronic ?Lab Results  ?Component Value Date  ? HGBA1C 7.8 (H) 01/09/2021  ? ?Sugars have not been well controlled ?Check A1c, urine microalbumin today ?Continue Rybelsus 3 mg daily-can consider increasing this if needed, continue metformin 1000 mg daily, Farxiga 10 mg daily ?Stressed regular exercise, diabetic diet ?Will adjust medication as needed ? ?

## 2021-05-10 NOTE — Assessment & Plan Note (Signed)
Chronic ?Controlled, stable ?Continue duloxetine 90 mg daily ? ?

## 2021-05-14 ENCOUNTER — Encounter: Payer: Self-pay | Admitting: Internal Medicine

## 2021-05-18 ENCOUNTER — Ambulatory Visit (INDEPENDENT_AMBULATORY_CARE_PROVIDER_SITE_OTHER): Payer: Medicare Other

## 2021-05-18 VITALS — Wt 224.0 lb

## 2021-05-18 DIAGNOSIS — Z0000 Encounter for general adult medical examination without abnormal findings: Secondary | ICD-10-CM

## 2021-05-18 NOTE — Patient Instructions (Signed)
Kerry Powell , ?Thank you for taking time to come for your Medicare Wellness Visit. I appreciate your ongoing commitment to your health goals. Please review the following plan we discussed and let me know if I can assist you in the future.  ? ?Screening recommendations/referrals: ?Colonoscopy: Done 04/01/2020 - no repeat required ?Mammogram: No longer required - may still have this done annually ?Bone Density: Not required, but recommended every 2 years ?Recommended yearly ophthalmology/optometry visit for glaucoma screening and checkup ?Recommended yearly dental visit for hygiene and checkup ? ?Vaccinations: ?Influenza vaccine: Declined - recommended annually in fall ?Pneumococcal vaccine: Declined - ask about once per lifetime [id number] ?Tdap vaccine: Declined - recommended every 10 years ?Shingles vaccine: Declined - Shingrix is 2 doses 2-6 months apart and over 90% effective     ?Covid-19:Done 05/01/2019 & 05/27/2019 - declines boosters ? ?Advanced directives: In chart ? ?Conditions/risks identified: Review the diet tips to help your kidneys at the end of this summary, and ensure you are drinking 4 bottles of water daily. ? ?Next appointment: Follow up in one year for your annual wellness visit  ? ? ?Preventive Care 76 Years and Older, Female ?Preventive care refers to lifestyle choices and visits with your health care provider that can promote health and wellness. ?What does preventive care include? ?A yearly physical exam. This is also called an annual well check. ?Dental exams once or twice a year. ?Routine eye exams. Ask your health care provider how often you should have your eyes checked. ?Personal lifestyle choices, including: ?Daily care of your teeth and gums. ?Regular physical activity. ?Eating a healthy diet. ?Avoiding tobacco and drug use. ?Limiting alcohol use. ?Practicing safe sex. ?Taking low-dose aspirin every day. ?Taking vitamin and mineral supplements as recommended by your health care  provider. ?What happens during an annual well check? ?The services and screenings done by your health care provider during your annual well check will depend on your age, overall health, lifestyle risk factors, and family history of disease. ?Counseling  ?Your health care provider may ask you questions about your: ?Alcohol use. ?Tobacco use. ?Drug use. ?Emotional well-being. ?Home and relationship well-being. ?Sexual activity. ?Eating habits. ?History of falls. ?Memory and ability to understand (cognition). ?Work and work Statistician. ?Reproductive health. ?Screening  ?You may have the following tests or measurements: ?Height, weight, and BMI. ?Blood pressure. ?Lipid and cholesterol levels. These may be checked every 5 years, or more frequently if you are over 39 years old. ?Skin check. ?Lung cancer screening. You may have this screening every year starting at age 42 if you have a 30-pack-year history of smoking and currently smoke or have quit within the past 15 years. ?Fecal occult blood test (FOBT) of the stool. You may have this test every year starting at age 70. ?Flexible sigmoidoscopy or colonoscopy. You may have a sigmoidoscopy every 5 years or a colonoscopy every 10 years starting at age 56. ?Hepatitis C blood test. ?Hepatitis B blood test. ?Sexually transmitted disease (STD) testing. ?Diabetes screening. This is done by checking your blood sugar (glucose) after you have not eaten for a while (fasting). You may have this done every 1-3 years. ?Bone density scan. This is done to screen for osteoporosis. You may have this done starting at age 49. ?Mammogram. This may be done every 1-2 years. Talk to your health care provider about how often you should have regular mammograms. ?Talk with your health care provider about your test results, treatment options, and if necessary, the need for more  tests. ?Vaccines  ?Your health care provider may recommend certain vaccines, such as: ?Influenza vaccine. This is  recommended every year. ?Tetanus, diphtheria, and acellular pertussis (Tdap, Td) vaccine. You may need a Td booster every 10 years. ?Zoster vaccine. You may need this after age 14. ?Pneumococcal 13-valent conjugate (PCV13) vaccine. One dose is recommended after age 39. ?Pneumococcal polysaccharide (PPSV23) vaccine. One dose is recommended after age 95. ?Talk to your health care provider about which screenings and vaccines you need and how often you need them. ?This information is not intended to replace advice given to you by your health care provider. Make sure you discuss any questions you have with your health care provider. ?Document Released: 03/11/2015 Document Revised: 11/02/2015 Document Reviewed: 12/14/2014 ?Elsevier Interactive Patient Education ? 2017 [place]. ? ?Fall Prevention in the Home ?Falls can cause injuries. They can happen to people of all ages. There are many things you can do to make your home safe and to help prevent falls. ?What can I do on the outside of my home? ?Regularly fix the edges of walkways and driveways and fix any cracks. ?Remove anything that might make you trip as you walk through a door, such as a raised step or threshold. ?Trim any bushes or trees on the path to your home. ?Use bright outdoor lighting. ?Clear any walking paths of anything that might make someone trip, such as rocks or tools. ?Regularly check to see if handrails are loose or broken. Make sure that both sides of any steps have handrails. ?Any raised decks and porches should have guardrails on the edges. ?Have any leaves, snow, or ice cleared regularly. ?Use sand or salt on walking paths during winter. ?Clean up any spills in your garage right away. This includes oil or grease spills. ?What can I do in the bathroom? ?Use night lights. ?Install grab bars by the toilet and in the tub and shower. Do not use towel bars as grab bars. ?Use non-skid mats or decals in the tub or shower. ?If you need to sit down in  the shower, use a plastic, non-slip stool. ?Keep the floor dry. Clean up any water that spills on the floor as soon as it happens. ?Remove soap buildup in the tub or shower regularly. ?Attach bath mats securely with double-sided non-slip rug tape. ?Do not have throw rugs and other things on the floor that can make you trip. ?What can I do in the bedroom? ?Use night lights. ?Make sure that you have a light by your bed that is easy to reach. ?Do not use any sheets or blankets that are too big for your bed. They should not hang down onto the floor. ?Have a firm chair that has side arms. You can use this for support while you get dressed. ?Do not have throw rugs and other things on the floor that can make you trip. ?What can I do in the kitchen? ?Clean up any spills right away. ?Avoid walking on wet floors. ?Keep items that you use a lot in easy-to-reach places. ?If you need to reach something above you, use a strong step stool that has a grab bar. ?Keep electrical cords out of the way. ?Do not use floor polish or wax that makes floors slippery. If you must use wax, use non-skid floor wax. ?Do not have throw rugs and other things on the floor that can make you trip. ?What can I do with my stairs? ?Do not leave any items on the stairs. ?Make sure  that there are handrails on both sides of the stairs and use them. Fix handrails that are broken or loose. Make sure that handrails are as long as the stairways. ?Check any carpeting to make sure that it is firmly attached to the stairs. Fix any carpet that is loose or worn. ?Avoid having throw rugs at the top or bottom of the stairs. If you do have throw rugs, attach them to the floor with carpet tape. ?Make sure that you have a light switch at the top of the stairs and the bottom of the stairs. If you do not have them, ask someone to add them for you. ?What else can I do to help prevent falls? ?Wear shoes that: ?Do not have high heels. ?Have rubber bottoms. ?Are comfortable  and fit you well. ?Are closed at the toe. Do not wear sandals. ?If you use a stepladder: ?Make sure that it is fully opened. Do not climb a closed stepladder. ?Make sure that both sides of the stepladder are locked into p

## 2021-05-18 NOTE — Progress Notes (Signed)
? ?Subjective:  ? Kerry Powell is a 76 y.o. female who presents for Medicare Annual (Subsequent) preventive examination. ? ?Virtual Visit via Telephone Note ? ?I connected with  Kerry Powell on 05/18/21 at  1:00 PM EDT by telephone and verified that I am speaking with the correct person using two identifiers. ? ?Location: ?Patient: Home ?Provider: [place] ?Persons participating in the virtual visit: patient/Nurse Health Advisor ?  ?I discussed the limitations, risks, security and privacy concerns of performing an evaluation and management service by telephone and the availability of in person appointments. The patient expressed understanding and agreed to proceed. ? ?Interactive audio and video telecommunications were attempted between this nurse and patient, however failed, due to patient having technical difficulties OR patient did not have access to video capability.  We continued and completed visit with audio only. ? ?Some vital signs may be absent or patient reported.  ? ?Kerry Raether Dionne Ano, LPN  ? ?Review of Systems    ? ?Cardiac Risk Factors include: advanced age (>29mn, >>50women);diabetes mellitus;family history of premature cardiovascular disease;obesity (BMI >30kg/m2);sedentary lifestyle;hypertension;Other (see comment), Risk factor comments: CAD, COPD, hx of CVA ? ?   ?Objective:  ?  ?Today's Vitals  ? 05/18/21 1112  ?Weight: 224 lb (101.6 kg)  ? ?Body mass index is 37.28 kg/m?. ? ? ?  05/18/2021  ? 11:28 AM 09/26/2020  ?  8:00 AM 04/13/2020  ?  2:43 PM 04/27/2019  ? 10:12 AM 05/19/2018  ?  9:48 AM 11/19/2016  ?  3:28 PM 11/09/2015  ?  2:24 PM  ?Advanced Directives  ?Does Patient Have a Medical Advance Directive? Yes No Yes Yes Yes No Yes  ?Type of AParamedicof AGardnerLiving will   Living will [place]Living will  HClarenceLiving will  ?Does patient want to make changes to medical advance directive?   No - Patient declined No -  Patient declined     ?Copy of HBeverlyin Chart? Yes - validated most recent copy scanned in chart (See row information)        ?Would patient like information on creating a medical advance directive?  No - Patient declined    Yes (ED - Information included in AVS)   ? ? ?Current Medications (verified) ?Outpatient Encounter Medications as of 05/18/2021  ?Medication Sig  ? ACETAMINOPHEN-BUTALBITAL 50-325 MG TABS TAKE 1 TABLET BY MOUTH EVERY DAY AS NEEDED  ? allopurinol (ZYLOPRIM) 300 MG tablet TAKE 1 TABLET BY MOUTH EVERY DAY  ? amLODipine (NORVASC) 5 MG tablet Take 1.5 tablets (7.5 mg total) by mouth daily.  ? cholecalciferol (VITAMIN D3) 25 MCG (1000 UNIT) tablet Take 2,000 Units by mouth daily.  ? clopidogrel (PLAVIX) 75 MG tablet Take 1 tablet by mouth once daily  ? DULoxetine (CYMBALTA) 60 MG capsule Take 1 capsule by mouth once daily  ? FARXIGA 10 MG TABS tablet Take 10 mg by mouth daily.  ? ferrous sulfate 325 (65 FE) MG tablet Take 325 mg by mouth daily with breakfast.  ? ipratropium-albuterol (DUONEB) 0.5-2.5 (3) MG/3ML SOLN USE 1 VIAL IN NEBULIZER 4 TIMES DAILY. Generic: DUONEB  ? metFORMIN (GLUCOPHAGE) 500 MG tablet Take 1,000 mg by mouth daily with breakfast.  ? metoprolol succinate (TOPROL-XL) 50 MG 24 hr tablet Take 50 mg by mouth daily.  ? montelukast (SINGULAIR) 10 MG tablet TAKE 1 TABLET BY MOUTH ONCE DAILY AT BEDTIME  ? pantoprazole (PROTONIX) 40 MG  tablet TAKE 1 TABLET BY MOUTH TWICE DAILY BEFORE A MEAL  ? rosuvastatin (CRESTOR) 40 MG tablet Take 1 tablet (40 mg total) by mouth daily.  ? Semaglutide (RYBELSUS) 3 MG TABS Take 3 mg by mouth daily.  ? telmisartan (MICARDIS) 80 MG tablet Take 1 tablet (80 mg total) by mouth daily.  ? cetirizine (ZYRTEC ALLERGY) 10 MG tablet Take 1 tablet (10 mg total) by mouth daily. (Patient not taking: Reported on 05/18/2021)  ? cyclobenzaprine (FLEXERIL) 5 MG tablet Take 1 tablet by mouth three times daily as needed for muscle spasm (Patient not  taking: Reported on 05/18/2021)  ? DULoxetine (CYMBALTA) 30 MG capsule Take 1 capsule by mouth once daily (Patient not taking: Reported on 05/18/2021)  ? fluticasone (FLONASE) 50 MCG/ACT nasal spray Place 2 sprays into both nostrils daily. (Patient not taking: Reported on 05/18/2021)  ? furosemide (LASIX) 20 MG tablet Take 20 mg by mouth 3 (three) times daily as needed for edema or fluid. 3 times a week as needed for swelling (Patient not taking: Reported on 05/18/2021)  ? Phenylephrine-DM-GG (MUCINEX FAST-MAX CONGEST COUGH PO) Take by mouth as needed. (Patient not taking: Reported on 05/18/2021)  ? umeclidinium-vilanterol (ANORO ELLIPTA) 62.5-25 MCG/INH AEPB Inhale 1 puff into the lungs daily. (Patient not taking: Reported on 05/18/2021)  ? [DISCONTINUED] HYDROcodone bit-homatropine (HYCODAN) 5-1.5 MG/5ML syrup Take 5 mLs by mouth every 8 (eight) hours as needed for cough. (Patient not taking: Reported on 05/18/2021)  ? [DISCONTINUED] metFORMIN (GLUCOPHAGE) 1000 MG tablet Take 1 tablet (1,000 mg total) by mouth daily with breakfast.  ? ?No facility-administered encounter medications on file as of 05/18/2021.  ? ? ?Allergies (verified) ?Gabapentin, Clindamycin, and Sulfa antibiotics  ? ?History: ?Past Medical History:  ?Diagnosis Date  ? Allergy   ? Anemia   ? Anxiety   ? Arthritis   ? BACK PAIN, CHRONIC 04/04/2007  ? Qualifier: Diagnosis of  By: Carlean Purl MD, FACG, [place] AFFECTIVE DISORDER 04/04/2007  ? Qualifier: History of  By: Carlean Purl MD, Tonna Boehringer E   ? CAD (coronary artery disease), native coronary artery   ? mild CAD with no obstructive disease by coronary CTA 06/2019  ? Cataract   ? removed both eyes  ? Chronic neck pain 04/30/2015  ? Cervical spondylosis [name] Intermittent numbness and tingling in occipital region and headaches S/p 2 occipital nerve blocks - only temp relief no relief with gabapentin, ultram, brace MRI neck - facet arthrosis   ? CKD (chronic kidney disease) 04/26/2015  ? COPD  (chronic obstructive pulmonary disease) ([place])   ? pt denies - told this while was smoking -  ? Cough 04/26/2015  ? Depression 07/01/2017  ? Diabetes ([place]) 04/27/2015  ? Diagnosed 03/2015   ? Dizziness 06/10/2017  ? Dysphagia 04/26/2015  ? DYSPNEA 12/24/2007  ? Qualifier: Diagnosis of  By: Glennis Brink    ? Elevated TSH 08/09/2015  ? GASTRIC ULCER, ACUTE 04/07/2007  ? Annotation: EGD 04/07/07 Qualifier: Diagnosis of  By: Carlean Purl MD, Tonna Boehringer E   ? GASTRITIS 04/07/2007  ? Annotation: EGD 04/07/07 Qualifier: Diagnosis of  By: Carlean Purl MD, Tonna Boehringer E   ? GERD (gastroesophageal reflux disease)   ? H/O renal cell carcinoma 05/23/2017  ? Dx 2010, s/p cryoablation Follows with urology - Dr Lovena Neighbours  ? HIATAL HERNIA 04/07/2007  ? Annotation: EGD 04/07/07 Qualifier: Diagnosis of  By: Carlean Purl MD, Dimas Millin   ? History of CVA (cerebrovascular accident) 04/26/2015  ?  History of gout 04/26/2015  ? Hyperlipidemia   ? Hypertension   ? Irritable bowel syndrome 04/04/2007  ? Qualifier: Diagnosis of  By: Carlean Purl MD, Dimas Millin   ? Kidney tumor (benign)   ? Nausea and vomiting 09/09/2017  ? Occipital neuralgia 05/09/2016  ? 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 OSTEOARTHRITIS 04/04/2007  ? Qualifier: Diagnosis of  By: Carlean Purl MD, Dimas Millin   ? Personal history of colonic adenoma 06/07/2012  ? POSTNASAL DRIP SYNDROME 12/29/2007  ? Qualifier: Diagnosis of  By: Chase Caller MD, [name]    ? RESTLESS LEG SYNDROME 04/04/2007  ? Qualifier: Diagnosis of  By: Carlean Purl MD, Dimas Millin   ? 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 Qualifier: Diagnosis of  By: Carlean Purl MD, Dimas Millin   ? Sleep difficulties 09/09/2017  ? Snoring 04/26/2015  ? Stroke [facility]   ? x2 2011  ? Thoracic aortic aneurysm 02/06/2018  ? Seen on MRI 01/25/18 of thoracic spine ordered by [facility] -- referred to CTS to monitor  -- 3.5 cm  ? TIA (transient ischemic attack)   ? TOBACCO ABUSE 12/17/2007  ? Qualifier: Diagnosis of  By: Chase Caller MD, [name]    ? 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 URI (upper respiratory infection) 02/08/2016  ? Vitamin D deficiency 03/12/2018  ? ?Past Surgical History:  ?Procedure Laterality Date  ? ABDOMINAL HYSTERECTOMY    ? APPEN

## 2021-05-21 ENCOUNTER — Encounter: Payer: Self-pay | Admitting: Internal Medicine

## 2021-05-22 ENCOUNTER — Other Ambulatory Visit: Payer: Self-pay | Admitting: Internal Medicine

## 2021-05-25 ENCOUNTER — Other Ambulatory Visit: Payer: Self-pay

## 2021-05-25 MED ORDER — AMLODIPINE BESYLATE 5 MG PO TABS: 7.5000 mg | ORAL_TABLET | Freq: Every day | ORAL | 1 refills | Status: DC

## 2021-06-06 ENCOUNTER — Other Ambulatory Visit: Payer: Self-pay | Admitting: Internal Medicine

## 2021-07-17 ENCOUNTER — Encounter: Payer: Self-pay | Admitting: Internal Medicine

## 2021-07-17 DIAGNOSIS — R52 Pain, unspecified: Secondary | ICD-10-CM

## 2021-07-17 DIAGNOSIS — R700 Elevated erythrocyte sedimentation rate: Secondary | ICD-10-CM

## 2021-07-25 ENCOUNTER — Encounter: Payer: Self-pay | Admitting: Internal Medicine

## 2021-07-25 NOTE — Progress Notes (Unsigned)
Subjective:    Patient ID: Kerry Powell, female    DOB: [birth year], 76 y.o.   MRN: [id number]     HPI Kerry Powell is here for follow up of her chronic medical problems, including B12 def, CKD, DM  She is taking all her medications as prescribed.    About one month ago she woke up with the right side of her mouth and right upper eye lid drooping.  She wonders if she had a stroke.  She denies other weakness, change in speech or difficulty swallowing.    Medications and allergies reviewed with patient and updated if appropriate.  Current Outpatient Medications on File Prior to Visit  Medication Sig Dispense Refill   ACETAMINOPHEN-BUTALBITAL 50-325 MG TABS TAKE 1 TABLET BY MOUTH EVERY DAY AS NEEDED 60 tablet 3   allopurinol (ZYLOPRIM) 300 MG tablet Take 1 tablet by mouth once daily 90 tablet 0   amLODipine (NORVASC) 5 MG tablet Take 1.5 tablets (7.5 mg total) by mouth daily. 135 tablet 1   cholecalciferol (VITAMIN D3) 25 MCG (1000 UNIT) tablet Take 2,000 Units by mouth daily.     clopidogrel (PLAVIX) 75 MG tablet Take 1 tablet by mouth once daily 90 tablet 0   DULoxetine (CYMBALTA) 30 MG capsule Take 1 capsule by mouth once daily 90 capsule 0   DULoxetine (CYMBALTA) 60 MG capsule Take 1 capsule by mouth once daily 90 capsule 0   FARXIGA 10 MG TABS tablet Take 10 mg by mouth daily.     ferrous sulfate 325 (65 FE) MG tablet Take 325 mg by mouth daily with breakfast.     fluticasone (FLONASE) 50 MCG/ACT nasal spray Place 2 sprays into both nostrils daily. 11.1 mL 11   furosemide (LASIX) 20 MG tablet Take 20 mg by mouth 3 (three) times daily as needed for edema or fluid. 3 times a week as needed for swelling     ipratropium-albuterol (DUONEB) 0.5-2.5 (3) MG/3ML SOLN USE 1 VIAL IN NEBULIZER 4 TIMES DAILY. Generic: DUONEB 360 mL 10   metoprolol succinate (TOPROL-XL) 50 MG 24 hr tablet Take 50 mg by mouth daily.     montelukast (SINGULAIR) 10 MG tablet TAKE 1 TABLET BY MOUTH ONCE DAILY AT  BEDTIME 30 tablet 4   pantoprazole (PROTONIX) 40 MG tablet TAKE 1 TABLET BY MOUTH TWICE DAILY BEFORE A MEAL 180 tablet 0   rosuvastatin (CRESTOR) 40 MG tablet Take 1 tablet (40 mg total) by mouth daily. 90 tablet 3   telmisartan (MICARDIS) 80 MG tablet Take 1 tablet (80 mg total) by mouth daily.     umeclidinium-vilanterol (ANORO ELLIPTA) 62.5-25 MCG/INH AEPB Inhale 1 puff into the lungs daily. 60 each 5   No current facility-administered medications on file prior to visit.     Review of Systems  Constitutional:  Negative for fever.  HENT:  Negative for trouble swallowing.   Eyes:  Negative for visual disturbance.  Musculoskeletal:  Positive for arthralgias and neck pain.  Neurological:  Positive for facial asymmetry (mild right facial droop). Negative for speech difficulty.      Objective:   Vitals:   07/26/21 1355  BP: 112/80  Pulse: 80  Temp: 98.2 F (36.8 C)  SpO2: 99%   BP Readings from Last 3 Encounters:  07/26/21 112/80  05/10/21 108/78  04/18/21 116/80   Wt Readings from Last 3 Encounters:  07/26/21 222 lb (100.7 kg)  05/18/21 224 lb (101.6 kg)  05/10/21 224 lb (101.6 kg)  Body mass index is 36.94 kg/m.    Physical Exam Constitutional:      General: She is not in acute distress.    Appearance: Normal appearance.  HENT:     Head: Normocephalic and atraumatic.  Eyes:     Conjunctiva/sclera: Conjunctivae normal.  Cardiovascular:     Rate and Rhythm: Normal rate and regular rhythm.     Heart sounds: Normal heart sounds. No murmur heard. Pulmonary:     Effort: Pulmonary effort is normal. No respiratory distress.     Breath sounds: Normal breath sounds. No wheezing.  Musculoskeletal:     Cervical back: Neck supple.     Right lower leg: No edema.     Left lower leg: No edema.  Lymphadenopathy:     Cervical: No cervical adenopathy.  Skin:    General: Skin is warm and dry.     Findings: No rash.  Neurological:     Mental Status: She is alert. Mental  status is at baseline.     Cranial Nerves: Cranial nerve deficit (very mild right sided facial droop) present.  Psychiatric:        Mood and Affect: Mood normal.        Behavior: Behavior normal.       Lab Results  Component Value Date   WBC 7.9 01/09/2021   HGB 14.4 01/09/2021   HCT 44.4 01/09/2021   PLT 273.0 01/09/2021   GLUCOSE 129 (H) 05/10/2021   CHOL 137 01/09/2021   TRIG 194.0 (H) 01/09/2021   HDL 40.30 01/09/2021   LDLDIRECT 79.0 08/01/2020   LDLCALC 58 01/09/2021   ALT 14 05/10/2021   AST 15 05/10/2021   NA 136 05/10/2021   K 4.3 05/10/2021   CL 101 05/10/2021   CREATININE 1.77 (H) 05/10/2021   BUN 21 05/10/2021   CO2 30 05/10/2021   TSH 3.48 08/01/2020   INR 0.91 06/30/2009   HGBA1C 7.4 (H) 05/10/2021     Assessment & Plan:    See Problem List for Assessment and Plan of chronic medical problems.

## 2021-07-25 NOTE — Patient Instructions (Addendum)
     Blood work was ordered.     Medications changes include :      Your prescription(s) have been sent to your pharmacy.    A referral was ordered for XX.     Someone from that office will call you to schedule an appointment.    Return for follow up as scheduled.

## 2021-07-26 ENCOUNTER — Other Ambulatory Visit: Payer: Self-pay | Admitting: Internal Medicine

## 2021-07-26 ENCOUNTER — Ambulatory Visit: Payer: Medicare Other | Admitting: Internal Medicine

## 2021-07-26 VITALS — BP 112/80 | HR 80 | Temp 98.2°F | Ht 65.0 in | Wt 222.0 lb

## 2021-07-26 DIAGNOSIS — N1831 Chronic kidney disease, stage 3a: Secondary | ICD-10-CM

## 2021-07-26 DIAGNOSIS — I10 Essential (primary) hypertension: Secondary | ICD-10-CM | POA: Diagnosis not present

## 2021-07-26 DIAGNOSIS — M159 Polyosteoarthritis, unspecified: Secondary | ICD-10-CM

## 2021-07-26 DIAGNOSIS — E559 Vitamin D deficiency, unspecified: Secondary | ICD-10-CM

## 2021-07-26 DIAGNOSIS — E538 Deficiency of other specified B group vitamins: Secondary | ICD-10-CM

## 2021-07-26 DIAGNOSIS — E1122 Type 2 diabetes mellitus with diabetic chronic kidney disease: Secondary | ICD-10-CM

## 2021-07-26 DIAGNOSIS — R29810 Facial weakness: Secondary | ICD-10-CM | POA: Insufficient documentation

## 2021-07-26 MED ORDER — METFORMIN HCL 500 MG PO TABS
500.0000 mg | ORAL_TABLET | Freq: Every day | ORAL | 0 refills | Status: DC
Start: 2021-07-26 — End: 2022-03-08

## 2021-07-26 MED ORDER — RYBELSUS 7 MG PO TABS: 7.0000 mg | ORAL_TABLET | Freq: Every day | ORAL | 5 refills | Status: DC

## 2021-07-26 MED ORDER — CYANOCOBALAMIN 1000 MCG/ML IJ SOLN
1000.0000 ug | Freq: Once | INTRAMUSCULAR | Status: AC
  Administered 2021-07-26: 1000 ug via INTRAMUSCULAR

## 2021-07-26 NOTE — Assessment & Plan Note (Signed)
New One month ago woke up and noticed very mild right facial droop No other deficits Discussed this may have been a stroke - she did not get evaluated Discussed imaging to evaluated but she deferred Discussed this may be a warning sign of a future stroke that could be worse - she deferred any evaluation at this time Continue plavix 75 mg daily Continue crestor 40 mg daily BP well controlled

## 2021-07-26 NOTE — Assessment & Plan Note (Signed)
Chronic Continue vitamin d supplementation

## 2021-07-26 NOTE — Assessment & Plan Note (Signed)
Chronic BP well controlled Continue amlodipine 7.5 mg daily, metoprolol xl 50 mg daily, telmisartin 80 mg daily

## 2021-07-26 NOTE — Assessment & Plan Note (Signed)
Chronic Has diffuse pain - feels like it got worse all of a sudden a few months ago Autoimmune labs neg Likely OA Discussed symptomatic treatment

## 2021-07-26 NOTE — Assessment & Plan Note (Signed)
Chronic Continue farxiga 10 mg daily Decreased metformin to 500 mg once a day Increase rybelsus to 7 mg daily  Will try to eventually get her off the metformin and increase rybelsus Continue diabetic diet

## 2021-07-26 NOTE — Assessment & Plan Note (Signed)
Chronic Continue monthly B12 injection  Due for injection - one given today

## 2021-08-04 ENCOUNTER — Encounter: Payer: Self-pay | Admitting: Internal Medicine

## 2021-08-06 ENCOUNTER — Other Ambulatory Visit: Payer: Self-pay | Admitting: Internal Medicine

## 2021-08-11 ENCOUNTER — Other Ambulatory Visit: Payer: Self-pay | Admitting: Nephrology

## 2021-08-11 DIAGNOSIS — N1832 Chronic kidney disease, stage 3b: Secondary | ICD-10-CM

## 2021-08-11 DIAGNOSIS — I701 Atherosclerosis of renal artery: Secondary | ICD-10-CM

## 2021-08-11 DIAGNOSIS — I5032 Chronic diastolic (congestive) heart failure: Secondary | ICD-10-CM

## 2021-08-11 DIAGNOSIS — Z905 Acquired absence of kidney: Secondary | ICD-10-CM

## 2021-08-11 DIAGNOSIS — E1122 Type 2 diabetes mellitus with diabetic chronic kidney disease: Secondary | ICD-10-CM

## 2021-08-11 DIAGNOSIS — I129 Hypertensive chronic kidney disease with stage 1 through stage 4 chronic kidney disease, or unspecified chronic kidney disease: Secondary | ICD-10-CM

## 2021-08-11 DIAGNOSIS — C641 Malignant neoplasm of right kidney, except renal pelvis: Secondary | ICD-10-CM

## 2021-08-14 LAB — HM DIABETES EYE EXAM

## 2021-08-15 ENCOUNTER — Ambulatory Visit
Admission: RE | Admit: 2021-08-15 | Discharge: 2021-08-15 | Disposition: A | Discharge status: Home / Self Care | Payer: Medicare Other | Source: Ambulatory Visit | Attending: Nephrology | Admitting: Nephrology

## 2021-08-15 DIAGNOSIS — I701 Atherosclerosis of renal artery: Secondary | ICD-10-CM

## 2021-08-15 DIAGNOSIS — Z905 Acquired absence of kidney: Secondary | ICD-10-CM

## 2021-08-15 DIAGNOSIS — N1832 Chronic kidney disease, stage 3b: Secondary | ICD-10-CM

## 2021-08-15 DIAGNOSIS — I129 Hypertensive chronic kidney disease with stage 1 through stage 4 chronic kidney disease, or unspecified chronic kidney disease: Secondary | ICD-10-CM

## 2021-08-15 DIAGNOSIS — E1122 Type 2 diabetes mellitus with diabetic chronic kidney disease: Secondary | ICD-10-CM

## 2021-08-15 DIAGNOSIS — I5032 Chronic diastolic (congestive) heart failure: Secondary | ICD-10-CM

## 2021-08-15 DIAGNOSIS — C641 Malignant neoplasm of right kidney, except renal pelvis: Secondary | ICD-10-CM

## 2021-08-16 ENCOUNTER — Other Ambulatory Visit: Payer: Self-pay | Admitting: Internal Medicine

## 2021-08-16 DIAGNOSIS — J948 Other specified pleural conditions: Secondary | ICD-10-CM

## 2021-08-25 ENCOUNTER — Encounter: Payer: Self-pay | Admitting: Internal Medicine

## 2021-09-01 ENCOUNTER — Other Ambulatory Visit: Payer: Self-pay | Admitting: Nephrology

## 2021-09-01 ENCOUNTER — Encounter: Payer: Self-pay | Admitting: Internal Medicine

## 2021-09-01 DIAGNOSIS — N2889 Other specified disorders of kidney and ureter: Secondary | ICD-10-CM

## 2021-09-01 NOTE — Progress Notes (Signed)
Outside notes received. Information abstracted. Notes sent to scan.  

## 2021-09-12 ENCOUNTER — Ambulatory Visit: Payer: Medicare Other | Admitting: Cardiology

## 2021-09-12 ENCOUNTER — Encounter: Payer: Self-pay | Admitting: Cardiology

## 2021-09-12 VITALS — BP 118/72 | HR 96 | Ht 65.0 in | Wt 220.0 lb

## 2021-09-12 DIAGNOSIS — I10 Essential (primary) hypertension: Secondary | ICD-10-CM

## 2021-09-12 DIAGNOSIS — R0609 Other forms of dyspnea: Secondary | ICD-10-CM

## 2021-09-12 DIAGNOSIS — I2510 Atherosclerotic heart disease of native coronary artery without angina pectoris: Secondary | ICD-10-CM | POA: Diagnosis not present

## 2021-09-12 DIAGNOSIS — R600 Localized edema: Secondary | ICD-10-CM

## 2021-09-12 DIAGNOSIS — I7123 Aneurysm of the descending thoracic aorta, without rupture: Secondary | ICD-10-CM

## 2021-09-12 DIAGNOSIS — E7800 Pure hypercholesterolemia, unspecified: Secondary | ICD-10-CM

## 2021-09-12 NOTE — Patient Instructions (Signed)
Medication Instructions:  Your physician recommends that you continue on your current medications as directed. Please refer to the Current Medication list given to you today.  *If you need a refill on your cardiac medications before your next appointment, please call your pharmacy*  Follow-Up: At [facility], you and your health needs are our priority.  As part of our continuing mission to provide you with exceptional heart care, we have created designated Provider Care Teams.  These Care Teams include your primary Cardiologist (physician) and Advanced Practice Providers (APPs -  Physician Assistants and Nurse Practitioners) who all work together to provide you with the care you need, when you need it.   Your next appointment:   1 year(s)  The format for your next appointment:   In Person  Provider:   Fransico Him, MD     Other Instructions Follow up with Dr. Shearon Stalls with pulmonary.   Follow up with Dr. Lona Kettle in 6 months.  Important Information About Sugar

## 2021-09-12 NOTE — Progress Notes (Signed)
Cardiology Office Note:    Date:  09/12/2021   ID:  Kerry Powell, DOB [birth year], MRN [id number]  PCP:  Binnie Rail, MD  Cardiologist:  Fransico Him, MD    Referring MD: Binnie Rail, MD   Chief Complaint  Patient presents with   Follow-up    DOE, thoracic aortic aneurysm, HTN, HLD, CAD    History of Present Illness:    Kerry Powell is a 76 y.o. female with a hx of Bipolar disorder, CKD, DOPD, DM2, HTN, HLD, GERD, CVA and aortic aneurysm.  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  Chest CTA was done showing occluded left renal artery, high grade stenosis of the superior right renal artery, extensive atheromatous plaque and nonocclusive mural thrombus in the distal descending thoracic and abdominal aorta without aneurysm or stenosis and scattered plaque in the iliac arterial systems with no stenosis.   She underwent repeat Chest CTA 03/2019 showing developing plaque-like region of probable pleuroparenchymal scarring at the left lung apex compared to relatively remote prior imaging from September of 2012. However, due to the change in appearance since the prior study and the patient's underlying emphysema and presumed smoking history, a developing malignancy such as a Pancoast tumor was difficult to exclude entirely. There was extensive, irregular and ulcerated fibrofatty atherosclerotic plaque throughout the descending thoracic aorta with multifocal areas of small penetrating atherosclerotic ulcers. No evidence of aneurysm or dissection. There were also coronary calcifications noted.   She is followed yearly by Dr. Arita Miss for her PVD.  She was seen by Pulmonary for SOB and abnormal chest CT.  Dr. Tamala Julian reviewed Chest CT and felt it was likely benign but agreed with followup CT in 3 months and that has been ordered by PCp and pulmonary who will be following up on it.   She had a coronary CTA in 2021 for increased SOB and showed  showed mRCA 25-49% and ostial LM and then pLAD and LCxs 0-24%. It was felt her DOE was related to COPD.  Her last chest CTA showed no aortic aneurysm and just aortic atherosclerosis and advanced COPD.  She is here today for followup and is doing well.  She has chronic DOE walking up a lot of stairs  related to her advanced COPD followed by Pulmonary.  She also has chronic LE edema which is controlled with diuretics.  She denies any chest pain or pressure, PND, orthopnea, dizziness, palpitations or syncope. She is compliant with her meds and is tolerating meds with no SE.     Past Medical History:  Diagnosis Date   Allergy    Anemia    Anxiety    Arthritis    BACK PAIN, CHRONIC 04/04/2007   Qualifier: Diagnosis of  By: Carlean Purl MD, FACG, [place] AFFECTIVE DISORDER 04/04/2007   Qualifier: History of  By: Carlean Purl MD, Tonna Boehringer E    CAD (coronary artery disease), native coronary artery    mild CAD with no obstructive disease by coronary CTA 06/2019   Cataract    removed both eyes   Chronic neck pain 04/30/2015   Cervical spondylosis [place] [facility] Intermittent numbness and tingling in occipital region and headaches S/p 2 occipital nerve blocks - only temp relief no relief with gabapentin, ultram, brace MRI neck - facet arthrosis    CKD (chronic kidney disease) 04/26/2015   COPD (chronic obstructive pulmonary disease) ([place])    pt denies -  told this while was smoking -   Cough 04/26/2015   Depression 07/01/2017   Diabetes ([place]) 04/27/2015   Diagnosed 03/2015    Dizziness 06/10/2017   Dysphagia 04/26/2015   DYSPNEA 12/24/2007   Qualifier: Diagnosis of  By: Langston Masker CNA, Nunzio Cory     Elevated TSH 08/09/2015   GASTRIC ULCER, ACUTE 04/07/2007   Annotation: EGD 04/07/07 Qualifier: Diagnosis of  By: Carlean Purl MD, Tonna Boehringer E    GASTRITIS 04/07/2007   Annotation: EGD 04/07/07 Qualifier: Diagnosis of  By: Carlean Purl MD, Tonna Boehringer E    GERD (gastroesophageal reflux disease)    H/O renal cell carcinoma  05/23/2017   Dx 2010, s/p cryoablation Follows with urology - Dr Lovena Neighbours   HIATAL HERNIA 04/07/2007   Annotation: EGD 04/07/07 Qualifier: Diagnosis of  By: Carlean Purl MD, Tonna Boehringer E    History of CVA (cerebrovascular accident) 04/26/2015   History of gout 04/26/2015   Hyperlipidemia    Hypertension    Irritable bowel syndrome 04/04/2007   Qualifier: Diagnosis of  By: Carlean Purl MD, Dimas Millin    Kidney tumor (benign)    Nausea and vomiting 09/09/2017   Occipital neuralgia 05/09/2016   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 Dr Dimas Millin in [employer] 04/04/2007   Qualifier: Diagnosis of  By: Carlean Purl MD, Dimas Millin    Personal history of colonic adenoma 06/07/2012   POSTNASAL DRIP SYNDROME 12/29/2007   Qualifier: Diagnosis of  By: Chase Caller MD, [name]     RESTLESS LEG SYNDROME 04/04/2007   Qualifier: Diagnosis of  By: Carlean Purl MD, Dimas Millin    RHINOSINUSITIS, 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   Qualifier: Diagnosis of  By: Carlean Purl MD, Dimas Millin    Sleep difficulties 09/09/2017   Snoring 04/26/2015   Stroke ([place])    x2 2011   Thoracic aortic aneurysm ([place]) 02/06/2018   Seen on MRI 01/25/18 of thoracic spine ordered by [facility] -- referred to CTS to monitor  -- 3.5 cm   TIA (transient ischemic attack)    TOBACCO ABUSE 12/17/2007   Qualifier: Diagnosis of  By: Chase Caller MD, [name]     Trigeminal neuralgia of right side of face 02/08/2016   URI (upper respiratory infection) 02/08/2016   Vitamin D deficiency 03/12/2018    Past Surgical History:  Procedure Laterality Date   ABDOMINAL HYSTERECTOMY     APPENDECTOMY     CATARACT EXTRACTION     COLONOSCOPY     CYSTOCELE REPAIR     ESOPHAGOGASTRODUODENOSCOPY     KIDNEY SURGERY     benign tumor removal   ORTHOPEDIC SURGERY     Wrist & elbow   POLYPECTOMY     RECTOCELE REPAIR     TONSILECTOMY, ADENOIDECTOMY, BILATERAL MYRINGOTOMY AND TUBES Bilateral 1968   UPPER GASTROINTESTINAL ENDOSCOPY      Current Medications: Current Meds  Medication Sig    ACETAMINOPHEN-BUTALBITAL 50-325 MG TABS TAKE 1 TABLET BY MOUTH EVERY DAY AS NEEDED   allopurinol (ZYLOPRIM) 300 MG tablet Take 1 tablet by mouth once daily   amLODipine (NORVASC) 5 MG tablet Take 1.5 tablets (7.5 mg total) by mouth daily.   cetirizine (ZYRTEC ALLERGY) 10 MG tablet 10 mg by oral route.   cholecalciferol (VITAMIN D3) 25 MCG (1000 UNIT) tablet Take 2,000 Units by mouth daily.   clopidogrel (PLAVIX) 75 MG tablet Take 1 tablet by mouth once daily   cyclobenzaprine (FLEXERIL) 5 MG tablet Take 1 tablet by mouth three times daily as needed for muscle spasm  cycloSPORINE (RESTASIS) 0.05 % ophthalmic emulsion Place 1 drop into both eyes as needed.   DULoxetine (CYMBALTA) 30 MG capsule Take 1 capsule by mouth once daily   DULoxetine (CYMBALTA) 60 MG capsule Take 1 capsule by mouth once daily   FARXIGA 10 MG TABS tablet Take 10 mg by mouth daily.   ferrous sulfate 325 (65 FE) MG tablet Take 325 mg by mouth daily with breakfast.   fluticasone (FLONASE) 50 MCG/ACT nasal spray Place 2 sprays into both nostrils daily.   furosemide (LASIX) 20 MG tablet Take 20 mg by mouth 3 (three) times daily as needed for edema or fluid. 3 times a week as needed for swelling   ipratropium-albuterol (DUONEB) 0.5-2.5 (3) MG/3ML SOLN USE 1 VIAL IN NEBULIZER 4 TIMES DAILY. Generic: DUONEB   metFORMIN (GLUCOPHAGE) 500 MG tablet Take 1 tablet (500 mg total) by mouth daily with breakfast.   metoprolol succinate (TOPROL-XL) 50 MG 24 hr tablet Take 50 mg by mouth daily.   montelukast (SINGULAIR) 10 MG tablet TAKE 1 TABLET BY MOUTH ONCE DAILY AT BEDTIME   pantoprazole (PROTONIX) 40 MG tablet TAKE 1 TABLET BY MOUTH TWICE DAILY BEFORE A MEAL   rosuvastatin (CRESTOR) 40 MG tablet Take 1 tablet (40 mg total) by mouth daily.   Semaglutide (RYBELSUS) 7 MG TABS Take 7 mg by mouth daily.   telmisartan (MICARDIS) 80 MG tablet Take 1 tablet (80 mg total) by mouth daily.   umeclidinium-vilanterol (ANORO ELLIPTA) 62.5-25  MCG/INH AEPB Inhale 1 puff into the lungs daily.     Allergies:   Gabapentin, Clindamycin, and Sulfa antibiotics   Social History   Socioeconomic History   Marital status: Married    Spouse name: Not on file   Number of children: 2   Years of education: Not on file   Highest education level: Not on file  Occupational History   Occupation: Retired  Tobacco Use   Smoking status: Former    Packs/day: 1.00    Years: 46.00    Total pack years: 46.00    Types: Cigarettes    Quit date: 06/30/2009    Years since quitting: 12.2   Smokeless tobacco: Never  Vaping Use   Vaping Use: Never used  Substance and Sexual Activity   Alcohol use: No   Drug use: No   Sexual activity: Not on file  Other Topics Concern   Not on file  Social History Narrative   Married, retired   1 son 1 daughter   2 caffeine/day   2 story home, but doesn't have to use stairs - son stays upstairs   Social Determinants of Health   Financial Resource Strain: Low Risk  (05/18/2021)   Overall Financial Resource Strain (CARDIA)    Difficulty of Paying Living Expenses: Not very hard  Food Insecurity: No Food Insecurity (05/18/2021)   Hunger Vital Sign    Worried About Running Out of Food in the Last Year: Never true    [place] in the Last Year: Never true  Transportation Needs: No Transportation Needs (05/18/2021)   PRAPARE - Hydrologist (Medical): No    Lack of Transportation (Non-Medical): No  Physical Activity: Inactive (05/18/2021)   Exercise Vital Sign    Days of Exercise per Week: 0 days    Minutes of Exercise per Session: 0 min  Stress: Stress Concern Present (05/18/2021)   [place]    Feeling of Stress :  To some extent  Social Connections: Moderately Isolated (05/18/2021)   Social Connection and Isolation Panel [NHANES]    Frequency of Communication with Friends and Family: More than three times a  week    Frequency of Social Gatherings with Friends and Family: More than three times a week    Attends Religious Services: Never    Marine scientist or Organizations: No    Attends Music therapist: Never    Marital Status: Married     Family History: The patient's family history includes Cancer in her mother; Congestive Heart Failure in her mother; Diabetes in her father and sister; Heart disease in her father; Kidney disease in her father and sister. There is no history of Colon cancer, Colon polyps, Esophageal cancer, Rectal cancer, or Stomach cancer.  ROS:   Please see the history of present illness.    ROS  All other systems reviewed and negative.   EKGs/Labs/Other Studies Reviewed:    The following studies were reviewed today: Coronary CTA 06/2019 FINDINGS: A 120 kV prospective scan was triggered in the descending thoracic aorta at 111 HU's. Axial non-contrast 3 mm slices were carried out through the heart. The data set was analyzed on a dedicated work station and scored using the Sigel. Gantry rotation speed was 250 msecs and collimation was .6 mm. No beta blockade and 0.8 mg of sl NTG was given. The 3D data set was reconstructed in 5% intervals of the 67-82 % of the R-R cycle. Diastolic phases were analyzed on a dedicated work station using MPR, MIP and VRT modes. The patient received 80 cc of contrast.   Aorta: Mildly dilated ascending aorta at 19m. Calcifications noted in the ascending and descending aorta. No dissection.   Aortic Valve:  Trileaflet.  Mildly calcified.   Mitral Valve: Mitral annular calcifications.   Coronary Arteries:  Normal coronary origin.  Right dominance.   RCA is a large dominant artery that gives rise to PDA and PLVB. There is a mild calcified plaque in the mid RCA with associated stenosis of 25-49%.   Left main is a large artery that gives rise to LAD and LCX arteries. There is mild calcified plaque in the  ostial and mid LM with associated stenosis of 25-45%.   LAD is a large vessel that a large D1. There is minimal calcified plaque in the proximal and mid LAD with associated stenosis of 0-24%.   LCX is a non-dominant artery that gives rise to one small OM1 and large OM2 branches. There is minimal calcified plaque in the proximal and mid LCx and mid OM2 with associated stenosis of 0-24%.   Other findings:   Normal pulmonary vein drainage into the left atrium.   Normal let atrial appendage without a thrombus.   Normal size of the pulmonary artery.   IMPRESSION: 1. Coronary calcium score of 390. This was 84th percentile for age and sex matched control.   2. Normal coronary origin with right dominance.   3.  Mild atherosclerosis.  CAD-RADS 2.   4.  Consider non-atherosclerotic causes of chest pain.   5.  Consider preventive therapy and risk factor modification.   TFransico Him    Electronically Signed   By: TFransico Him  On: 07/08/2019 14:31    Addended by TSueanne Margarita MD on 07/08/2019  2:33 PM    Study Result  Narrative & Impression  EXAM: OVER-READ INTERPRETATION  CT CHEST   The following report is  an over-read performed by radiologist Dr. Rolm Baptise of [facility], [place] on 07/02/2019. This over-read does not include interpretation of cardiac or coronary anatomy or pathology. The coronary CTA interpretation by the cardiologist is attached.   COMPARISON:  06/30/2019   FINDINGS: Vascular: Heart is normal size. Marked irregular plaque in the descending thoracic aorta. No evidence of aneurysm.   Mediastinum/Nodes: No adenopathy in the lower mediastinum or hila.   Lungs/Pleura: Visualized lungs clear.  No effusions.   Upper Abdomen: Imaging into the upper abdomen shows no acute findings.   Musculoskeletal: Chest wall soft tissues are unremarkable. No acute bony abnormality.   IMPRESSION: Markedly irregular calcified and noncalcified plaque in  the descending thoracic aorta.       EKG:  EKG is ordered today and showed NSR with on ST changes  Recent Labs: 09/28/2020: Magnesium 2.3 01/09/2021: Hemoglobin 14.4; Platelets 273.0 05/10/2021: ALT 14; BUN 21; Creatinine, Ser 1.77; Potassium 4.3; Sodium 136   Recent Lipid Panel    Component Value Date/Time   CHOL 137 01/09/2021 1624   CHOL 132 08/10/2019 1110   TRIG 194.0 (H) 01/09/2021 1624   HDL 40.30 01/09/2021 1624   HDL 35 (L) 08/10/2019 1110   CHOLHDL 3 01/09/2021 1624   VLDL 38.8 01/09/2021 1624   LDLCALC 58 01/09/2021 1624   LDLCALC 69 09/30/2019 1532   LDLDIRECT 79.0 08/01/2020 1336    Physical Exam:    VS:  BP 118/72   Pulse 96   Ht '5\' 5"'$  (1.651 m)   Wt 220 lb (99.8 kg)   SpO2 96%   BMI 36.61 kg/m     Wt Readings from Last 3 Encounters:  09/12/21 220 lb (99.8 kg)  07/26/21 222 lb (100.7 kg)  05/18/21 224 lb (101.6 kg)    GEN: Well nourished, well developed in no acute distress HEENT: Normal NECK: No JVD; No carotid bruits LYMPHATICS: No lymphadenopathy CARDIAC:RRR, no murmurs, rubs, gallops RESPIRATORY:  Clear to auscultation without rales, wheezing or rhonchi  ABDOMEN: Soft, non-tender, non-distended MUSCULOSKELETAL:  No edema; No deformity  SKIN: Warm and dry NEUROLOGIC:  Alert and oriented x 3 PSYCHIATRIC:  Normal affect   ASSESSMENT:    1. Aneurysm of descending thoracic aorta without rupture (HCC)   2. Essential hypertension, benign   3. DOE (dyspnea on exertion)   4. Pure hypercholesterolemia   5. Coronary artery disease involving native coronary artery of native heart without angina pectoris   6. Bilateral leg edema    PLAN:    In order of problems listed above:  1.  Thoracic aortic aneurysm  -Chest CT 03/2019 showed extensive irregular and ulcerated atherosclerotic plaque in the descending thoracic aorta with multifocal areas of small penetrating ulcers but no evidence of aneurysm or dissection -Abdominal CTA showed atheromatous  plaque and nonocclusive mural thrombus in distal descending thoracic and abdominal aorta with no aneurysm and left renal artery occlusion and right superior renal artery with high grade stenosis. -Dr. Arita Miss is following her PVD -BP remains adequately controlled -Continue statin therapy with Crestor as well as beta-blocker and ARB for blood pressure control  2.  HTN  -BP adequately controlled -Continue prescription drug management with telmisartan 80 mg daily, Toprol-XL 50 mg daily and amlodipine 7.5 mg daily with as needed refills. -I have personally reviewed and interpreted outside labs performed by patient's PCP which showed serum creatinine 1.77 and potassium 4.3 on 05/10/2021   3.  Chronic DOE -this is related to underlying COPD which is followed by  Pulmonary -nuclear stress test 2020 showed no ischemia -coronary CTA 2021 showed mRCA 25-49% and ostial LM and then pLAD and LCs 0-24% and it was felt her DOE was not related to coronary ischemia -Per pulmonary  4. HLD -LDL goal < 70  -I have personally reviewed and interpreted outside labs performed by patient's PCP which showed LDL 58, HDL 40, triglycerides 194  on 01/09/2021 -Continue prescription drug therapy with Crestor 40 mg daily with as needed refills  5.  CAD -coronary CTA 2021 showed mRCA 25-49% and ostial LM and then pLAD and LCs 0-24% and it was felt her DOE was not related to coronary ischemia -She has not had any anginal chest pain -Continue prescription drug management with Plavix 75 mg daily, Toprol-XL 50 mg daily and statin therapy  6. Chronic LE edema -continue PRN diuretics  Followup with me in 1 year  Medication Adjustments/Labs and Tests Ordered: Current medicines are reviewed at length with the patient today.  Concerns regarding medicines are outlined above.  Orders Placed This Encounter  Procedures   EKG 12-Lead   No orders of the defined types were placed in this encounter.   Signed, Fransico Him, MD   09/12/2021 2:55 PM    [place]

## 2021-09-13 ENCOUNTER — Other Ambulatory Visit: Payer: Medicare Other

## 2021-09-13 ENCOUNTER — Ambulatory Visit
Admission: RE | Admit: 2021-09-13 | Discharge: 2021-09-13 | Disposition: A | Discharge status: Home / Self Care | Payer: Medicare Other | Source: Ambulatory Visit | Attending: Nephrology | Admitting: Nephrology

## 2021-09-13 DIAGNOSIS — N2889 Other specified disorders of kidney and ureter: Secondary | ICD-10-CM

## 2021-09-13 MED ORDER — GADOBENATE DIMEGLUMINE 529 MG/ML IV SOLN
15.0000 mL | Freq: Once | INTRAVENOUS | Status: AC | PRN
  Administered 2021-09-13: 15 mL via INTRAVENOUS

## 2021-09-15 ENCOUNTER — Other Ambulatory Visit: Payer: Self-pay | Admitting: Internal Medicine

## 2021-10-03 ENCOUNTER — Other Ambulatory Visit: Payer: Self-pay | Admitting: Cardiology

## 2021-10-03 ENCOUNTER — Other Ambulatory Visit: Payer: Self-pay | Admitting: Internal Medicine

## 2021-10-22 ENCOUNTER — Other Ambulatory Visit: Payer: Self-pay | Admitting: Internal Medicine

## 2021-11-06 ENCOUNTER — Encounter: Payer: Self-pay | Admitting: Internal Medicine

## 2021-11-06 ENCOUNTER — Ambulatory Visit: Payer: Medicare Other | Admitting: Internal Medicine

## 2021-11-06 VITALS — BP 126/74 | HR 90 | Temp 97.3°F | Ht 65.5 in | Wt 221.0 lb

## 2021-11-06 DIAGNOSIS — J410 Simple chronic bronchitis: Secondary | ICD-10-CM | POA: Diagnosis not present

## 2021-11-06 DIAGNOSIS — J310 Chronic rhinitis: Secondary | ICD-10-CM

## 2021-11-06 MED ORDER — MONTELUKAST SODIUM 10 MG PO TABS: 10.0000 mg | ORAL_TABLET | Freq: Every day | ORAL | 11 refills | Status: DC

## 2021-11-06 MED ORDER — ALBUTEROL SULFATE (2.5 MG/3ML) 0.083% IN NEBU: 2.5000 mg | INHALATION_SOLUTION | Freq: Four times a day (QID) | RESPIRATORY_TRACT | 12 refills | Status: DC | PRN

## 2021-11-06 MED ORDER — FLUTICASONE PROPIONATE 50 MCG/ACT NA SUSP
2.0000 | Freq: Every day | NASAL | 11 refills | Status: DC
Start: 2021-11-06 — End: 2021-11-06

## 2021-11-06 MED ORDER — FLUTICASONE PROPIONATE 50 MCG/ACT NA SUSP: 2.0000 | Freq: Every day | NASAL | 11 refills | Status: DC

## 2021-11-06 NOTE — Patient Instructions (Addendum)
Please schedule follow up scheduled with myself in 6 months.  If my schedule is not open yet, we will contact you with a reminder closer to that time. Please call [phone] if you haven't heard from [place] a month before.   Continue your albuterol nebulizer therapies as needed.  Start taking flonase, singulair for your nasal congestion. Continue cetirizine(zyrtec)  Flonase - 1 spray on each side of your nose twice a day for first week, then 1 spray on each side.   Instructions for use: If you also use a saline nasal spray or rinse, use that first. Position the head with the chin slightly tucked. Use the right hand to spray into the left nostril and the right hand to spray into the left nostril.   Point the bottle away from the septum of your nose (cartilage that divides the two sides of your nose).  Hold the nostril closed on the opposite side from where you will spray Spray once and gently sniff to pull the medicine into the higher parts of your nose.  Don't sniff too hard as the medicine will drain down the back of your throat instead. Repeat with a second spray on the same side if prescribed. Repeat on the other side of your nose.

## 2021-11-06 NOTE — Progress Notes (Signed)
Kerry Powell    [id number]    [birth year]  Primary Care Physician:Burns, [name], MD Date of Appointment: 11/06/2021 Established Patient Visit  Chief complaint:   Chief Complaint  Patient presents with   Follow-up    SOB every so often      HPI: Kerry Powell is a 76 y.o. woman with COPD on anoro as well as bi-apical parenchymal scarring (stable on CT)  Interval Updates: Here for follow up after almost 3 years.  She hasn't been using anoro inhaler. She is taking albuterol nebulizer 1-2 times/day.  No hospitalizations or ED visits for breathing.  She feels her breathing isn't so bad, it's really more the cough.   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 She has history of esophageal dilation and is having symptoms of dysphagia again.   Has chronic post nasal drainage and takes zyrtec once daily. Also wanting refill on flonase.   I have reviewed the patient's family social and past medical history and updated as appropriate.   Past Medical History:  Diagnosis Date   Allergy    Anemia    Anxiety    Arthritis    BACK PAIN, CHRONIC 04/04/2007   Qualifier: Diagnosis of  By: Carlean Purl MD, FACG, [place] DISORDER 04/04/2007   Qualifier: History of  By: Carlean Purl MD, Tonna Boehringer E    CAD (coronary artery disease), native coronary artery    mild CAD with no obstructive disease by coronary CTA 06/2019   Cataract    removed both eyes   Chronic neck pain 04/30/2015   Cervical spondylosis [facility] Intermittent numbness and tingling in occipital region and headaches S/p 2 occipital nerve blocks - only temp relief no relief with gabapentin, ultram, brace MRI neck - facet arthrosis    CKD (chronic kidney disease) 04/26/2015   COPD (chronic obstructive pulmonary disease) ([place])    pt denies - told this while was smoking -   Cough 04/26/2015   Depression 07/01/2017   Diabetes ([place]) 04/27/2015   Diagnosed 03/2015     Dizziness 06/10/2017   Dysphagia 04/26/2015   DYSPNEA 12/24/2007   Qualifier: Diagnosis of  By: Glennis Brink     Elevated TSH 08/09/2015   GASTRIC ULCER, ACUTE 04/07/2007   Annotation: EGD 04/07/07 Qualifier: Diagnosis of  By: Carlean Purl MD, Tonna Boehringer E    GASTRITIS 04/07/2007   Annotation: EGD 04/07/07 Qualifier: Diagnosis of  By: Carlean Purl MD, Tonna Boehringer E    GERD (gastroesophageal reflux disease)    H/O renal cell carcinoma 05/23/2017   Dx 2010, s/p cryoablation Follows with urology - Dr Lovena Neighbours   HIATAL HERNIA 04/07/2007   Annotation: EGD 04/07/07 Qualifier: Diagnosis of  By: Carlean Purl MD, Tonna Boehringer E    History of CVA (cerebrovascular accident) 04/26/2015   History of gout 04/26/2015   Hyperlipidemia    Hypertension    Irritable bowel syndrome 04/04/2007   Qualifier: Diagnosis of  By: Carlean Purl MD, Dimas Millin    Kidney tumor (benign)    Nausea and vomiting 09/09/2017   Occipital neuralgia 05/09/2016   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 Dr Dimas Millin in [place] 04/04/2007   Qualifier: Diagnosis of  By: Carlean Purl MD, Dimas Millin    Personal history of colonic adenoma 06/07/2012   POSTNASAL DRIP SYNDROME 12/29/2007   Qualifier: Diagnosis of  By: Chase Caller MD, Belva Crome  RESTLESS LEG SYNDROME 04/04/2007   Qualifier: Diagnosis of  By: Carlean Purl MD, FACG, [place], 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   Qualifier: Diagnosis of  By: Carlean Purl MD, Dimas Millin    Sleep difficulties 09/09/2017   Snoring 04/26/2015   Stroke [facility])    x2 2011   Thoracic aortic aneurysm ([place]) 02/06/2018   Seen on MRI 01/25/18 of thoracic spine ordered by [facility] -- referred to CTS to monitor  -- 3.5 cm   TIA (transient ischemic attack)    TOBACCO ABUSE 12/17/2007   Qualifier: Diagnosis of  By: Chase Caller MD, [name]     Trigeminal neuralgia of right side of face 02/08/2016   URI (upper respiratory infection) 02/08/2016   Vitamin D deficiency 03/12/2018    Past Surgical History:  Procedure Laterality Date    ABDOMINAL HYSTERECTOMY     APPENDECTOMY     CATARACT EXTRACTION     COLONOSCOPY     CYSTOCELE REPAIR     ESOPHAGOGASTRODUODENOSCOPY     KIDNEY SURGERY     benign tumor removal   ORTHOPEDIC SURGERY     Wrist & elbow   POLYPECTOMY     RECTOCELE REPAIR     TONSILECTOMY, ADENOIDECTOMY, BILATERAL MYRINGOTOMY AND TUBES Bilateral 1968   UPPER GASTROINTESTINAL ENDOSCOPY      Family History  Problem Relation Age of Onset   Congestive Heart Failure Mother    Cancer Mother        ovarian   Heart disease Father    Diabetes Father    Kidney disease Father    Diabetes Sister    Kidney disease Sister    Colon cancer Neg Hx    Colon polyps Neg Hx    Esophageal cancer Neg Hx    Rectal cancer Neg Hx    Stomach cancer Neg Hx     Social History   Occupational History   Occupation: Retired  Tobacco Use   Smoking status: Former    Packs/day: 1.00    Years: 46.00    Total pack years: 46.00    Types: Cigarettes    Quit date: 06/30/2009    Years since quitting: 12.3   Smokeless tobacco: Never  Vaping Use   Vaping Use: Never used  Substance and Sexual Activity   Alcohol use: No   Drug use: No   Sexual activity: Not on file     Physical Exam: Blood pressure 126/74, pulse 90, temperature (!) 97.3 F (36.3 C), temperature source Oral, height 5' 5.5" (1.664 m), weight 221 lb (100.2 kg), SpO2 94 %.  Gen:      No acute distress ENT:  no nasal polyps, mucus membranes moist Lungs:    No increased respiratory effort, symmetric chest wall excursion, clear to auscultation bilaterally, no wheezes or crackles CV:         Regular rate and rhythm; no murmurs, rubs, or gallops.  No pedal edema   Data Reviewed: Imaging: I have personally reviewed the CT Chest done dec 2021 which shows stable bi-apical scarring and severe centrilobular emphysema  PFTs:      Latest Ref Rng & Units 02/15/2020    2:48 PM  PFT Results  FVC-Pre L 2.68   FVC-Predicted Pre % 90   FVC-Post L 2.82    FVC-Predicted Post % 94   Pre FEV1/FVC % % 74   Post FEV1/FCV % % 78   FEV1-Pre L 1.97   FEV1-Predicted Pre % 88   FEV1-Post L 2.20  DLCO uncorrected ml/min/mmHg 15.98   DLCO UNC% % 80   DLCO corrected ml/min/mmHg 15.98   DLCO COR %Predicted % 80   DLVA Predicted % 95   TLC L 4.86   TLC % Predicted % 93   RV % Predicted % 85    I have personally reviewed the patient's PFTs and they show mild airflow limitation with normal lung volumes and diffusion capacity.  Labs:  Immunization status: Immunization History  Administered Date(s) Administered   PFIZER(Purple Top)SARS-COV-2 Vaccination 05/01/2019, 05/27/2019    Assessment:  COPD with Gold Stage 0  History of tobacco use disorder Chronic Rhinitis GERD  Dysphagia  Plan/Recommendations: Albuterol nebs refilled. She would like to stay off maintenance therapy for now - didn't feel like anoro helped much.   Will start flonase 1 spray each side daily for chronic rhinitis, continue anti-histamine. Will add montelukast.   Continue PPI.   Return to Care: Return in about 6 months (around 05/07/2022).   Lenice Llamas, MD Pulmonary and [place]

## 2021-11-09 ENCOUNTER — Encounter: Payer: Self-pay | Admitting: Internal Medicine

## 2021-11-09 NOTE — Progress Notes (Unsigned)
Subjective:    Patient ID: Kerry Powell, female    DOB: 1946-02-02, 76 y.o.   MRN: [id number]     HPI Kerry Powell is here for follow up of her chronic medical problems, including htn, hld, DM, CKD, anxiety, depression, gout, h/o CVA  She is taking all of her medications as prescribed.    Medications and allergies reviewed with patient and updated if appropriate.  Current Outpatient Medications on File Prior to Visit  Medication Sig Dispense Refill   ACETAMINOPHEN-BUTALBITAL 50-325 MG TABS TAKE 1 TABLET BY MOUTH EVERY DAY AS NEEDED 60 tablet 3   albuterol (PROVENTIL) (2.5 MG/3ML) 0.083% nebulizer solution Take 3 mLs (2.5 mg total) by nebulization every 6 (six) hours as needed for wheezing or shortness of breath. 75 mL 12   allopurinol (ZYLOPRIM) 300 MG tablet Take 1 tablet by mouth once daily 90 tablet 0   cetirizine (ZYRTEC ALLERGY) 10 MG tablet 10 mg by oral route.     cholecalciferol (VITAMIN D3) 25 MCG (1000 UNIT) tablet Take 2,000 Units by mouth daily.     clopidogrel (PLAVIX) 75 MG tablet Take 1 tablet by mouth once daily 90 tablet 0   cyclobenzaprine (FLEXERIL) 5 MG tablet Take 1 tablet by mouth three times daily as needed for muscle spasm 90 tablet 0   DULoxetine (CYMBALTA) 60 MG capsule Take 1 capsule by mouth once daily 90 capsule 0   FARXIGA 10 MG TABS tablet Take 10 mg by mouth daily.     ferrous sulfate 325 (65 FE) MG tablet Take 325 mg by mouth daily with breakfast.     fluticasone (FLONASE) 50 MCG/ACT nasal spray Place 2 sprays into both nostrils daily. 11.1 mL 11   furosemide (LASIX) 20 MG tablet Take 20 mg by mouth 3 (three) times daily as needed for edema or fluid. 3 times a week as needed for swelling     metFORMIN (GLUCOPHAGE) 500 MG tablet Take 1 tablet (500 mg total) by mouth daily with breakfast. 90 tablet 0   metoprolol succinate (TOPROL-XL) 50 MG 24 hr tablet Take 50 mg by mouth daily.     montelukast (SINGULAIR) 10 MG tablet Take 1 tablet (10 mg total) by  mouth at bedtime. 30 tablet 11   pantoprazole (PROTONIX) 40 MG tablet TAKE 1 TABLET BY MOUTH TWICE DAILY BEFORE A MEAL 180 tablet 1   rosuvastatin (CRESTOR) 40 MG tablet Take 1 tablet by mouth once daily 90 tablet 3   Semaglutide (RYBELSUS) 7 MG TABS Take 7 mg by mouth daily. 30 tablet 5   telmisartan (MICARDIS) 80 MG tablet Take 1 tablet (80 mg total) by mouth daily.     No current facility-administered medications on file prior to visit.     Review of Systems  Constitutional:  Negative for chills and fever.  HENT:  Positive for trouble swallowing.   Respiratory:  Positive for cough. Negative for shortness of breath and wheezing.   Cardiovascular:  Negative for chest pain, palpitations and leg swelling.  Gastrointestinal:        No gerd  Neurological:  Positive for light-headedness and headaches.       Objective:   Vitals:   11/10/21 1513  BP: 120/74  Pulse: 92  Temp: 97.9 F (36.6 C)  SpO2: 99%   BP Readings from Last 3 Encounters:  11/10/21 120/74  11/06/21 126/74  09/12/21 118/72   Wt Readings from Last 3 Encounters:  11/10/21 220 lb (99.8 kg)  11/06/21  221 lb (100.2 kg)  09/12/21 220 lb (99.8 kg)   Body mass index is 36.05 kg/m.    Physical Exam Constitutional:      General: She is not in acute distress.    Appearance: Normal appearance.  HENT:     Head: Normocephalic and atraumatic.  Eyes:     Conjunctiva/sclera: Conjunctivae normal.  Cardiovascular:     Rate and Rhythm: Normal rate and regular rhythm.     Heart sounds: Normal heart sounds. No murmur heard. Pulmonary:     Effort: Pulmonary effort is normal. No respiratory distress.     Breath sounds: Normal breath sounds. No wheezing.  Musculoskeletal:     Cervical back: Neck supple.     Right lower leg: No edema.     Left lower leg: No edema.  Lymphadenopathy:     Cervical: No cervical adenopathy.  Skin:    General: Skin is warm and dry.     Findings: No rash.  Neurological:     Mental  Status: She is alert. Mental status is at baseline.  Psychiatric:        Mood and Affect: Mood normal.        Behavior: Behavior normal.        Lab Results  Component Value Date   WBC 7.9 01/09/2021   HGB 14.4 01/09/2021   HCT 44.4 01/09/2021   PLT 273.0 01/09/2021   GLUCOSE 129 (H) 05/10/2021   CHOL 137 01/09/2021   TRIG 194.0 (H) 01/09/2021   HDL 40.30 01/09/2021   LDLDIRECT 79.0 08/01/2020   LDLCALC 58 01/09/2021   ALT 14 05/10/2021   AST 15 05/10/2021   NA 136 05/10/2021   K 4.3 05/10/2021   CL 101 05/10/2021   CREATININE 1.77 (H) 05/10/2021   BUN 21 05/10/2021   CO2 30 05/10/2021   TSH 3.48 08/01/2020   INR 0.91 06/30/2009   HGBA1C 7.4 (H) 05/10/2021     Assessment & Plan:    See Problem List for Assessment and Plan of chronic medical problems.

## 2021-11-10 ENCOUNTER — Ambulatory Visit: Payer: Medicare Other | Admitting: Internal Medicine

## 2021-11-10 VITALS — BP 120/74 | HR 92 | Temp 97.9°F | Ht 65.5 in | Wt 220.0 lb

## 2021-11-10 DIAGNOSIS — I10 Essential (primary) hypertension: Secondary | ICD-10-CM | POA: Diagnosis not present

## 2021-11-10 DIAGNOSIS — F419 Anxiety disorder, unspecified: Secondary | ICD-10-CM

## 2021-11-10 DIAGNOSIS — R600 Localized edema: Secondary | ICD-10-CM

## 2021-11-10 DIAGNOSIS — E538 Deficiency of other specified B group vitamins: Secondary | ICD-10-CM | POA: Diagnosis not present

## 2021-11-10 DIAGNOSIS — F3289 Other specified depressive episodes: Secondary | ICD-10-CM

## 2021-11-10 DIAGNOSIS — E1122 Type 2 diabetes mellitus with diabetic chronic kidney disease: Secondary | ICD-10-CM

## 2021-11-10 DIAGNOSIS — E782 Mixed hyperlipidemia: Secondary | ICD-10-CM | POA: Diagnosis not present

## 2021-11-10 DIAGNOSIS — N1831 Chronic kidney disease, stage 3a: Secondary | ICD-10-CM | POA: Diagnosis not present

## 2021-11-10 DIAGNOSIS — Z8673 Personal history of transient ischemic attack (TIA), and cerebral infarction without residual deficits: Secondary | ICD-10-CM

## 2021-11-10 DIAGNOSIS — M1A30X0 Chronic gout due to renal impairment, unspecified site, without tophus (tophi): Secondary | ICD-10-CM

## 2021-11-10 DIAGNOSIS — N1832 Chronic kidney disease, stage 3b: Secondary | ICD-10-CM | POA: Diagnosis not present

## 2021-11-10 LAB — COMPREHENSIVE METABOLIC PANEL
ALT: 18 U/L (ref 0–35)
AST: 20 U/L (ref 0–37)
Albumin: 4.2 g/dL (ref 3.5–5.2)
Alkaline Phosphatase: 106 U/L (ref 39–117)
BUN: 20 mg/dL (ref 6–23)
CO2: 30 mEq/L (ref 19–32)
Calcium: 9.8 mg/dL (ref 8.4–10.5)
Chloride: 102 mEq/L (ref 96–112)
Creatinine, Ser: 1.97 mg/dL — ABNORMAL HIGH (ref 0.40–1.20)
GFR: 24.23 mL/min — ABNORMAL LOW (ref >60.00)
Glucose, Bld: 115 mg/dL — ABNORMAL HIGH (ref 70–99)
Potassium: 4.8 mEq/L (ref 3.5–5.1)
Sodium: 138 mEq/L (ref 135–145)
Total Bilirubin: 0.6 mg/dL (ref 0.2–1.2)
Total Protein: 7.3 g/dL (ref 6.0–8.3)

## 2021-11-10 LAB — CBC WITH DIFFERENTIAL/PLATELET
Basophils Absolute: 0 10*3/uL (ref 0.0–0.1)
Basophils Relative: 0.4 % (ref 0.0–3.0)
Eosinophils Absolute: 0.2 10*3/uL (ref 0.0–0.7)
Eosinophils Relative: 3.1 % (ref 0.0–5.0)
HCT: 45.5 % (ref 36.0–46.0)
Hemoglobin: 15 g/dL (ref 12.0–15.0)
Lymphocytes Relative: 27.5 % (ref 12.0–46.0)
Lymphs Abs: 2 10*3/uL (ref 0.7–4.0)
MCHC: 32.9 g/dL (ref 30.0–36.0)
MCV: 98.2 fl (ref 78.0–100.0)
Monocytes Absolute: 0.7 10*3/uL (ref 0.1–1.0)
Monocytes Relative: 9.5 % (ref 3.0–12.0)
Neutro Abs: 4.3 10*3/uL (ref 1.4–7.7)
Neutrophils Relative %: 59.5 % (ref 43.0–77.0)
Platelets: 263 10*3/uL (ref 150.0–400.0)
RBC: 4.64 Mil/uL (ref 3.87–5.11)
RDW: 14.2 % (ref 11.5–15.5)
WBC: 7.2 10*3/uL (ref 4.0–10.5)

## 2021-11-10 LAB — HEMOGLOBIN A1C: Hgb A1c MFr Bld: 7 % — ABNORMAL HIGH (ref 4.6–6.5)

## 2021-11-10 LAB — LIPID PANEL
Cholesterol: 139 mg/dL (ref 0–200)
HDL: 41.7 mg/dL (ref >39.00)
LDL Cholesterol: 69 mg/dL (ref 0–99)
NonHDL: 97.64
Total CHOL/HDL Ratio: 3
Triglycerides: 141 mg/dL (ref 0.0–149.0)
VLDL: 28.2 mg/dL (ref 0.0–40.0)

## 2021-11-10 IMAGING — MR MR CERVICAL SPINE W/O CM
4 of 5 series · 27 of 48 positions shown · non-contrast
Comparison: None.

CLINICAL DATA: Chronic neck pain

EXAM:
MRI CERVICAL SPINE WITHOUT CONTRAST
TECHNIQUE: Multiplanar, multisequence MR imaging of the cervical spine was
performed. No intravenous contrast was administered.

[Series 5: T1 · sagittal · 3.0mm · 0.66mm/px · 6 of 15 slices shown]
[im 1/15]
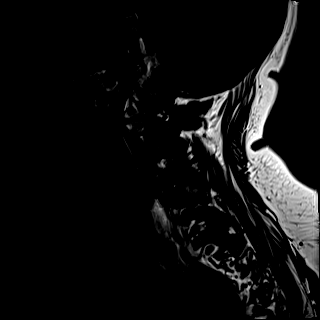
[im 3/15]
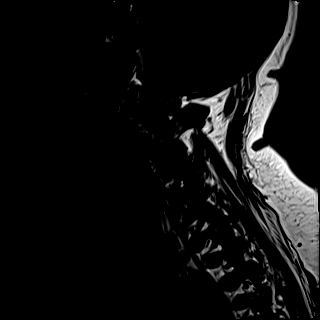
[im 6/15]
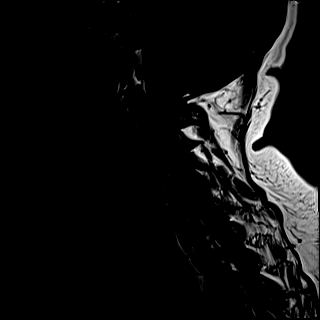
[im 9/15]
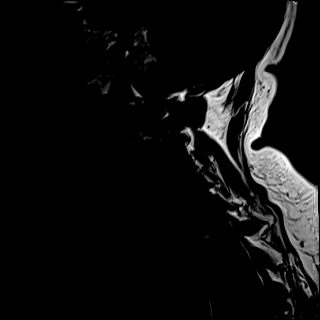
[im 12/15]
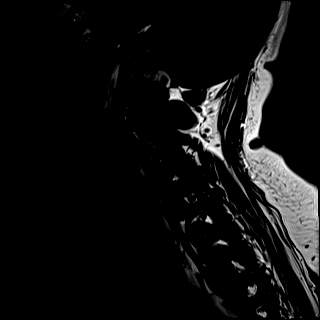
[im 15/15]
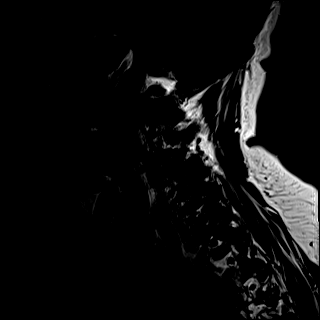

[Series 6: T2 · sagittal · 3.0mm · 0.55mm/px · 7 of 15 slices shown (1 of 2)]
[im 1/15]
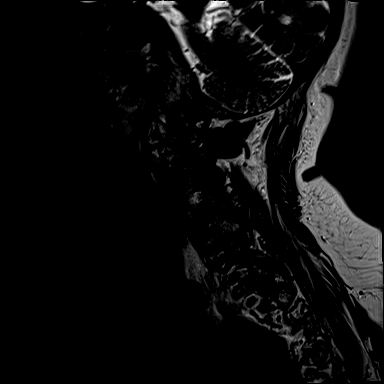
[im 3/15]
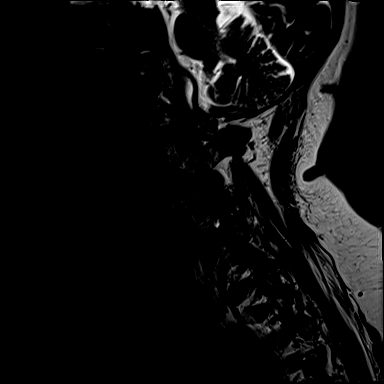
[im 5/15]
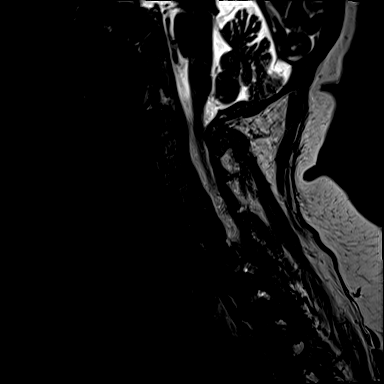
[im 8/15]
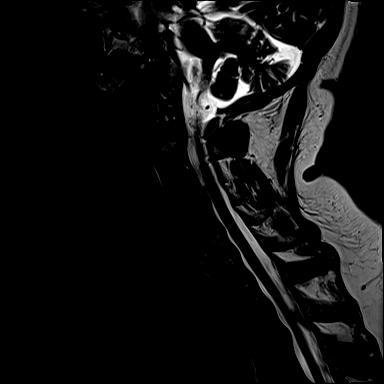
[im 10/15]
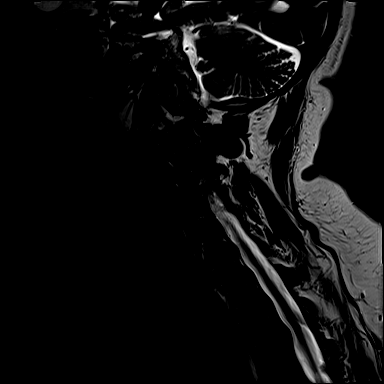
[im 12/15]
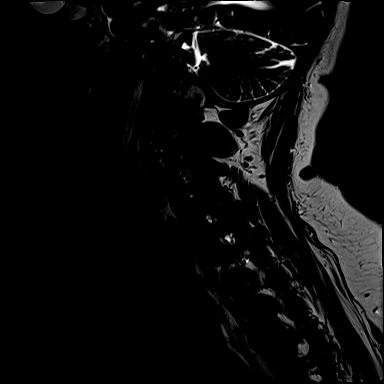
[im 15/15]
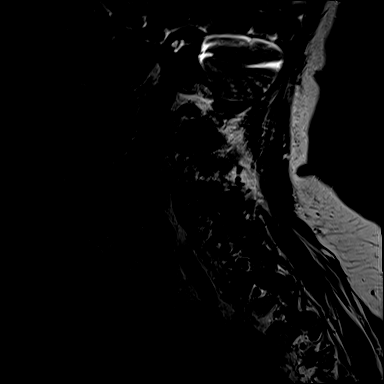

[Series 8: T2 · axial · 3.0mm · 0.50mm/px · z∈[-62,+30]mm · 8 of 32 slices shown (2 of 2)]
[im 1/32]
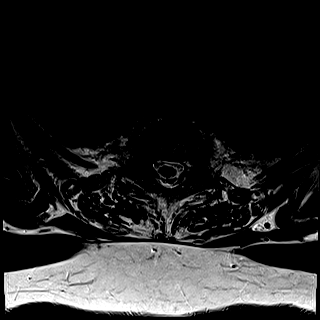
[im 5/32]
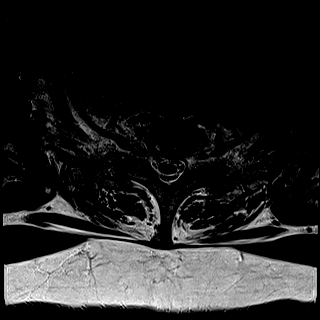
[im 10/32]
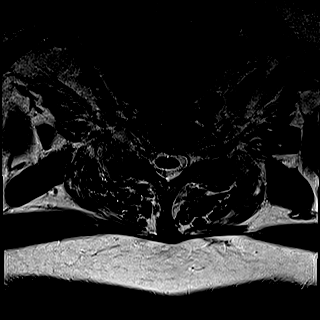
[im 15/32]
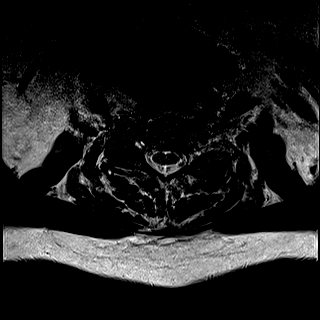
[im 17/32]
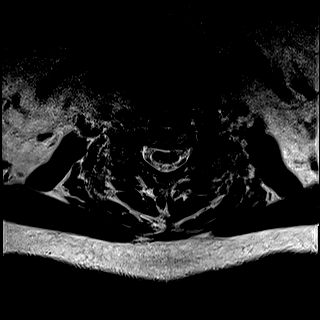
[im 22/32]
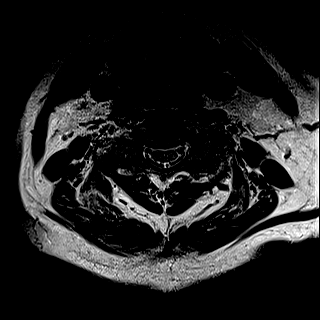
[im 27/32]
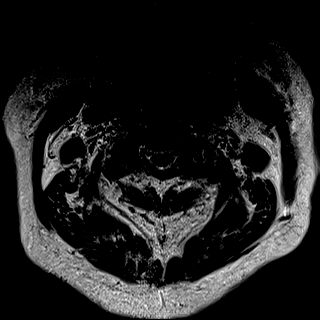
[im 32/32]
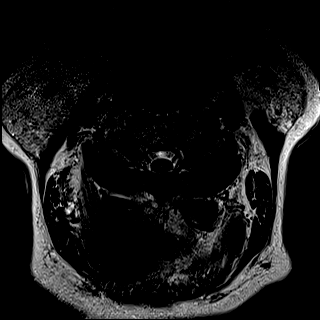

[Series 10: STIR · sagittal · 3.0mm · 0.33mm/px · 6 of 15 slices shown]
[im 1/15]
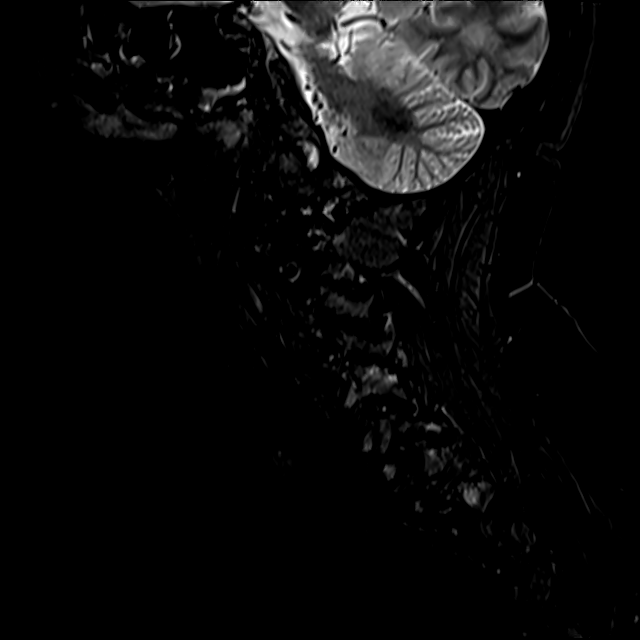
[im 3/15]
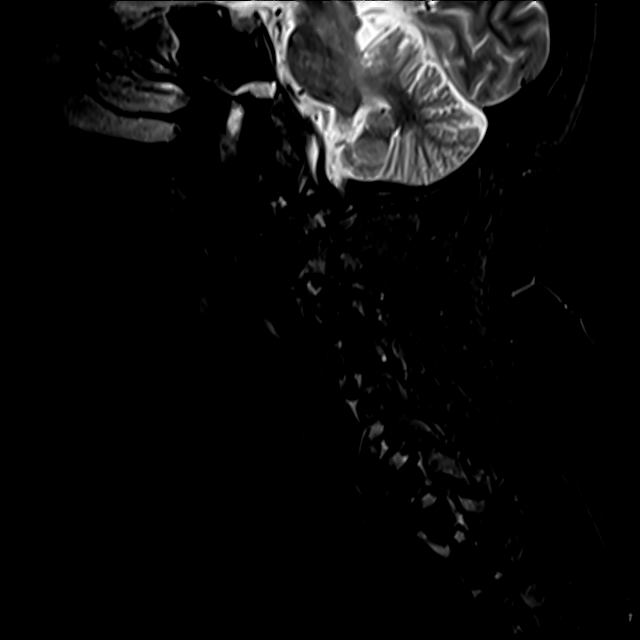
[im 5/15]
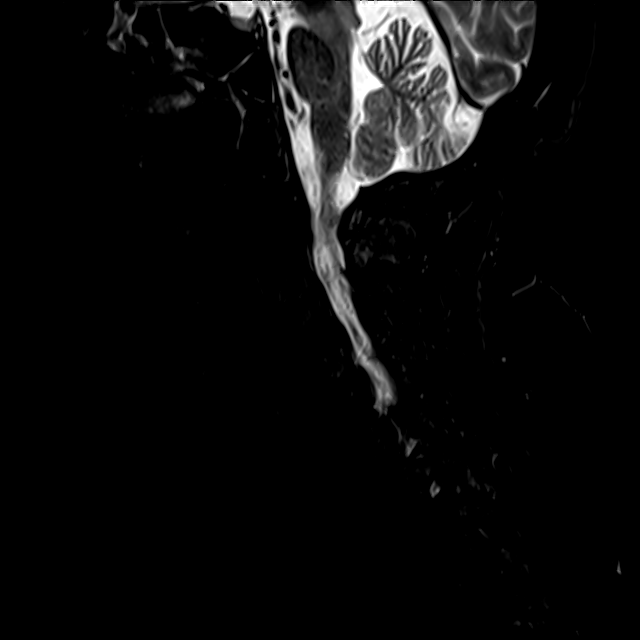
[im 8/15]
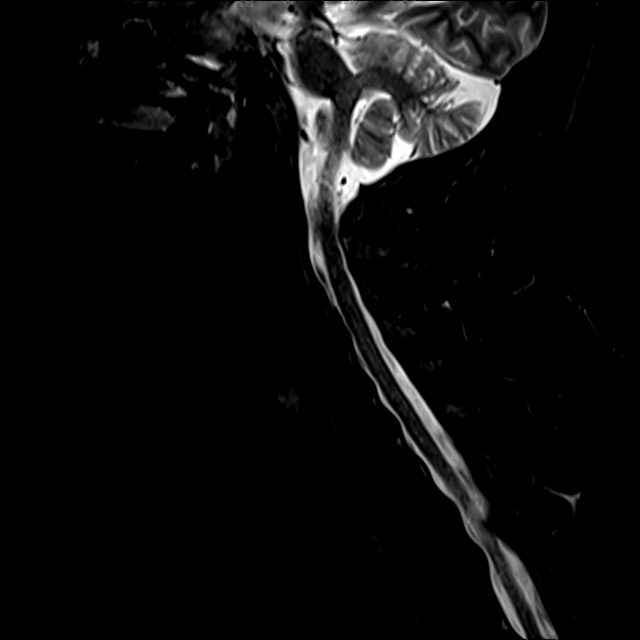
[im 10/15]
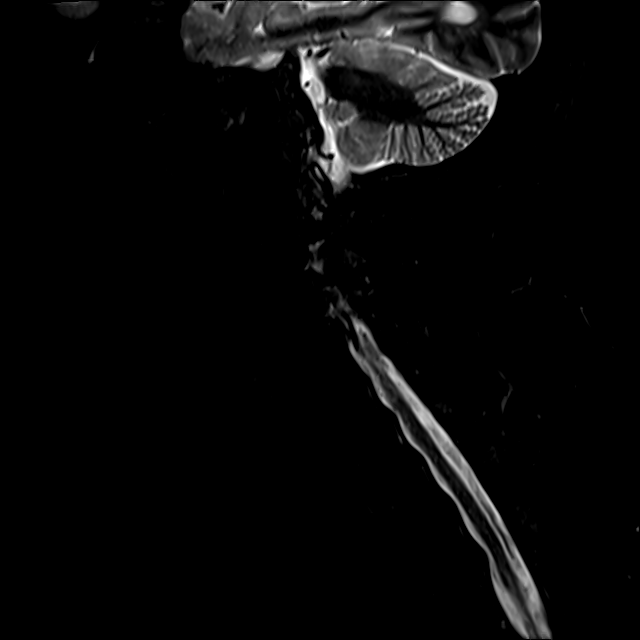
[im 12/15]
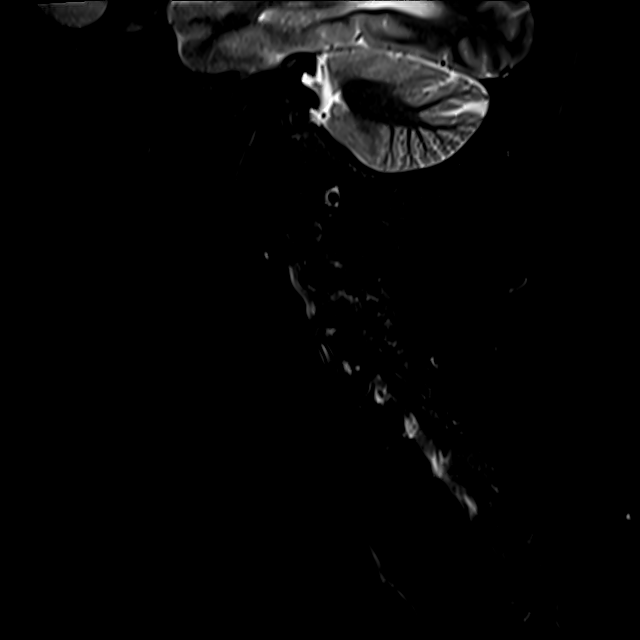

[27 of 48 positions shown; findings below may reference images not displayed]

FINDINGS: Alignment: Grade 1 anterolisthesis at C6-7. Straightening of normal
cervical lordosis.

Vertebrae: No fracture, evidence of discitis, or bone lesion.

Cord: Normal signal and morphology.

Posterior Fossa, vertebral arteries, paraspinal tissues: Negative.

Disc levels:

C1-C2: Unremarkable.

C2-C3: Mild facet hypertrophy. There is no spinal canal stenosis.
Moderate bilateral neural foraminal stenosis.

C3-C4: Severe right facet hypertrophy. There is no spinal canal
stenosis. Moderate right neural foraminal stenosis.

C4-C5: Severe left facet hypertrophy. No disc herniation. There is
no spinal canal stenosis. Severe left neural foraminal stenosis.

C5-C6: Normal disc space and facet joints. There is no spinal canal
stenosis. No neural foraminal stenosis.

C6-C7: Normal disc space and facet joints. There is no spinal canal
stenosis. No neural foraminal stenosis.

C7-T1: Normal disc space and facet joints. There is no spinal canal
stenosis. No neural foraminal stenosis.
IMPRESSION: 1. Severe left C4-5 neural foraminal stenosis
2. Moderate bilateral C2-3 and right C3-4 neural foraminal stenosis.
3. No spinal canal stenosis.

## 2021-11-10 MED ORDER — CYANOCOBALAMIN 1000 MCG/ML IJ SOLN
1000.0000 ug | Freq: Once | INTRAMUSCULAR | Status: AC
  Administered 2021-11-10: 1000 ug via INTRAMUSCULAR

## 2021-11-10 MED ORDER — AMLODIPINE BESYLATE 5 MG PO TABS: 5.0000 mg | ORAL_TABLET | Freq: Every day | ORAL | 1 refills | Status: DC

## 2021-11-10 NOTE — Patient Instructions (Addendum)
     Blood work was ordered.      Medications changes include :   none      Return in about 6 months (around 05/11/2022) for follow up.

## 2021-11-10 NOTE — Assessment & Plan Note (Signed)
Chronic B12 injection today

## 2021-11-10 NOTE — Assessment & Plan Note (Signed)
Chronic Lasix prn

## 2021-11-10 NOTE — Assessment & Plan Note (Signed)
Chronic BP well controlled - had a couple of low Bp readings - continue to monitor Continue amlodipine 5 mg daily, metoprolol xl 50 mg daily, telmisartan daily cmp

## 2021-11-10 NOTE — Assessment & Plan Note (Addendum)
Chronic  Lab Results  Component Value Date   HGBA1C 7.4 (H) 05/10/2021   Sugars not ideally controlled Testing sugars 1 times a day Check A1c Continue farxiga 10 mg daily, metformin 500 mg daily, rybelsus 7 mg daily Stressed regular exercise, diabetic diet

## 2021-11-16 DIAGNOSIS — M25512 Pain in left shoulder: Secondary | ICD-10-CM | POA: Insufficient documentation

## 2021-11-29 ENCOUNTER — Encounter: Payer: Self-pay | Admitting: Nurse Practitioner

## 2021-11-29 ENCOUNTER — Ambulatory Visit: Payer: Medicare Other | Admitting: Nurse Practitioner

## 2021-11-29 VITALS — BP 120/80 | HR 78 | Ht 65.0 in | Wt 221.4 lb

## 2021-11-29 DIAGNOSIS — R1310 Dysphagia, unspecified: Secondary | ICD-10-CM | POA: Diagnosis not present

## 2021-11-29 NOTE — Patient Instructions (Addendum)
We will contact you after talking to Dr. Carlean Purl to see if he feels another EGD with esophageal dilation will be helpful for you.   It was a pleasure to see you today!  Thank you for trusting me with your gastrointestinal care!

## 2021-11-29 NOTE — Progress Notes (Addendum)
Chief Complaint:  swallowing problems   Assessment &  Plan   # 76 yo female with chronic dysphagia.  A little hard to sort out as she describes "strangling" on food which would seem to be more of an oropharyngeal dysphagia but this was not demonstrated on modified barium swallow study.  Additionally she points to her chest as where almost all solid food is getting stuck. Had [facility] dilation for a prominent cricopharyngeal impression in 2017, cannot recall if it helped.   Repeat EGD Feb 2022 without strictures / esophageal abnormalities.   She is eating only soft food. Swallowing precautions discussed. Advised to eat slowly, chew food the best she can ( has to eat without dentures). Drink  liquids in between each bite to avoid food impaction. Her symptoms are chronic but it is still possible that she may have developed an esophageal stricture.  Maybe element of dysmotility?  I told her that I would talk with her primary GI, Dr. Carlean Purl to see if he feels another EGD with dilation would be worthwhile.  Would need to be off Plavix  # CAD, on Plavix  # See PMH / [place] for additional history   GI attending  I recommend she have ba swallow + tablet next  Gatha Mayer, MD, [facility]  HPI   Kerry Powell is a 76 y.o. female with a past medical history significant for colon polyps , iron deficiency anemia , rectocele , chronic dysphagia, esophageal ring, hiatal hernia, HTN, thoracic aortic aneurysm, PVD, CAD on Plavix, CKD3, CVA, COPD see PMH /PSH for additional history   Kerry Powell is here today for worsening of chronic dysphagia, previously evaluated by Dr. Carlean Purl.  Had EGD in 2017  - esophagus was normal. She underwent [facility] dilation for a prominent cricopharyngeal impression which had been seen on a barium swallow.  She had another EGD February 2022 for evaluation of dysphagia and anemia.  A 4 cm hiatal hernia was found.  Exam was otherwise normal.  She subsequently had a MBSS - "oral phase  appears to be functional - question impact of ill-fitting dentures. Residue within the vallecula occurred when swallowing a pill which pt cleared with second swallow of thin liquids. Recomend regular diet and thin liquids".      Interval History  She is having progressive solid food dysphagia. It is a little confusion as she describes "choking" during meals but also points to chest and says this is where food is sticking Gradually getting worse over the last year.   Almost anything solid gets stuck in mid chest. Does okay with liquids overall it sounds like.  Her dentures are ill fitting, she has to eat without them. Since she doesn't have teeth she has to eat foods which are very soft and do not require much mastication. She complains of a hoarse voice. She has to frequently expectorate phlegm.  Endorses sinus drainage. No significant heartburn on PPI therapy  Previous GI Evaluation   *Studies pertinent to this visit.   EGD 2017 SHE HAD A PROMINENT CRICOPHARYNGEAL IMPRESSION SEEN ON BA SWALLOW - SO DILATION WAS PERFORM  - Normal esophagus. - 2 cm hiatal hernia. - Normal examined duodenum. - The examination was otherwise normal. - Dilation performed in the entire esophagus. [place] W/O HEME   EGD Feb 2022 for dysphagia and anemia  - 4 cm hiatal hernia. - Gastroesophageal flap valve classified as Hill Grade IV (no fold, wide open lumen, hiatal  hernia present). - The examination was otherwise normal. - No specimens collected. No cause of dysphagia or anemia seen.   Feb 2022 MBSS Clinical Impression Pt demonstrates an overall timely and functional oropharyngeal swallow with good airway protection noted with all consistencies. Trace pentetration was demonstrated x1 with thin liquids (PAS 2) in which coughing occured afterwards; however, coughing was also noted throughout PO trials that was unrelated to pentration or aspiration. Pt demonstrated swallow initation with thin  liquids at the pyriform sinuses. Oral residue and poor bolus cohesion were noted across all consistencies, but oral phase still appears to be functional - question impact of ill-fitting dentures. Residue within the vallecula occurred when swallowing a pill which pt cleared with second swallow of thin liquids. Recomend regular diet and thin liquids with pt to choose consistencies that are best suited for her given difficulty with dentures. Pt may benefit from continued f/u with GI doctor if difficullties continue to present.  SLP Visit Diagnosis Dysphagia, unspecified (R13.10)     Labs:     Latest Ref Rng & Units 11/10/2021    4:05 PM 01/09/2021    4:24 PM 09/28/2020    3:42 AM  CBC  WBC 4.0 - 10.5 K/uL 7.2  7.9  6.2   Hemoglobin 12.0 - 15.0 g/dL 15.0  14.4  13.4   Hematocrit 36.0 - 46.0 % 45.5  44.4  41.7   Platelets 150.0 - 400.0 K/uL 263.0  273.0  245        Latest Ref Rng & Units 11/10/2021    4:05 PM 05/10/2021    4:01 PM 01/09/2021    4:24 PM  Hepatic Function  Total Protein 6.0 - 8.3 g/dL 7.3  7.0  7.3   Albumin 3.5 - 5.2 g/dL 4.2  4.4  4.6   AST 0 - 37 U/L 20  15  19    ALT 0 - 35 U/L 18  14  17    Alk Phosphatase 39 - 117 U/L 106  103  95   Total Bilirubin 0.2 - 1.2 mg/dL 0.6  0.4  0.6      Past Medical History:  Diagnosis Date   Allergy    Anemia    Anxiety    Arthritis    BACK PAIN, CHRONIC 04/04/2007   Qualifier: Diagnosis of  By: Carlean Purl MD, Dimas Millin    BIPOLAR AFFECTIVE DISORDER 04/04/2007   Qualifier: History of  By: Carlean Purl MD, Tonna Boehringer E    CAD (coronary artery disease), native coronary artery    mild CAD with no obstructive disease by coronary CTA 06/2019   Cataract    removed both eyes   Chronic neck pain 04/30/2015   Cervical spondylosis [place] [facility] Intermittent numbness and tingling in occipital region and headaches S/p 2 occipital nerve blocks - only temp relief no relief with gabapentin, ultram, brace MRI neck - facet arthrosis    CKD (chronic  kidney disease) 04/26/2015   COPD (chronic obstructive pulmonary disease) ([place])    pt denies - told this while was smoking -   Cough 04/26/2015   Depression 07/01/2017   Diabetes ([place]) 04/27/2015   Diagnosed 03/2015    Dizziness 06/10/2017   Dysphagia 04/26/2015   DYSPNEA 12/24/2007   Qualifier: Diagnosis of  By: Glennis Brink     Elevated TSH 08/09/2015   GASTRIC ULCER, ACUTE 04/07/2007   Annotation: EGD 04/07/07 Qualifier: Diagnosis of  By: Carlean Purl MD, Tonna Boehringer E    GASTRITIS 04/07/2007   Annotation:  EGD 04/07/07 Qualifier: Diagnosis of  By: Carlean Purl MD, Tonna Boehringer E    GERD (gastroesophageal reflux disease)    H/O renal cell carcinoma 05/23/2017   Dx 2010, s/p cryoablation Follows with urology - Dr Lovena Neighbours   HIATAL HERNIA 04/07/2007   Annotation: EGD 04/07/07 Qualifier: Diagnosis of  By: Carlean Purl MD, Tonna Boehringer E    History of CVA (cerebrovascular accident) 04/26/2015   History of gout 04/26/2015   Hyperlipidemia    Hypertension    Irritable bowel syndrome 04/04/2007   Qualifier: Diagnosis of  By: Carlean Purl MD, Dimas Millin    Kidney tumor (benign)    Nausea and vomiting 09/09/2017   Occipital neuralgia 05/09/2016   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 Dr Dimas Millin in [place] 04/04/2007   Qualifier: Diagnosis of  By: Carlean Purl MD, Dimas Millin    Personal history of colonic adenoma 06/07/2012   POSTNASAL DRIP SYNDROME 12/29/2007   Qualifier: Diagnosis of  By: Chase Caller MD, [name]     RESTLESS LEG SYNDROME 04/04/2007   Qualifier: Diagnosis of  By: Carlean Purl MD, Dimas Millin    RHINOSINUSITIS, 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   Qualifier: Diagnosis of  By: Carlean Purl MD, Dimas Millin    Sleep difficulties 09/09/2017   Snoring 04/26/2015   Stroke ([place])    x2 2011   Thoracic aortic aneurysm ([place]) 02/06/2018   Seen on MRI 01/25/18 of thoracic spine ordered by [facility] -- referred to CTS to monitor  -- 3.5 cm   TIA (transient ischemic attack)    TOBACCO ABUSE 12/17/2007   Qualifier: Diagnosis of  By:  Chase Caller MD, [name]     Trigeminal neuralgia of right side of face 02/08/2016   URI (upper respiratory infection) 02/08/2016   Vitamin D deficiency 03/12/2018    Past Surgical History:  Procedure Laterality Date   ABDOMINAL HYSTERECTOMY     APPENDECTOMY     CATARACT EXTRACTION     COLONOSCOPY     CYSTOCELE REPAIR     ESOPHAGOGASTRODUODENOSCOPY     KIDNEY SURGERY     benign tumor removal   ORTHOPEDIC SURGERY     Wrist & elbow   POLYPECTOMY     RECTOCELE REPAIR     TONSILECTOMY, ADENOIDECTOMY, BILATERAL MYRINGOTOMY AND TUBES Bilateral 1968   UPPER GASTROINTESTINAL ENDOSCOPY      Current Medications, Allergies, Family History and Social History were reviewed in [facility] record.     Current Outpatient Medications  Medication Sig Dispense Refill   ACETAMINOPHEN-BUTALBITAL 50-325 MG TABS TAKE 1 TABLET BY MOUTH EVERY DAY AS NEEDED 60 tablet 3   albuterol (PROVENTIL) (2.5 MG/3ML) 0.083% nebulizer solution Take 3 mLs (2.5 mg total) by nebulization every 6 (six) hours as needed for wheezing or shortness of breath. 75 mL 12   allopurinol (ZYLOPRIM) 300 MG tablet Take 1 tablet by mouth once daily 90 tablet 0   amLODipine (NORVASC) 5 MG tablet Take 1 tablet (5 mg total) by mouth daily. 90 tablet 1   cetirizine (ZYRTEC ALLERGY) 10 MG tablet 10 mg by oral route.     cholecalciferol (VITAMIN D3) 25 MCG (1000 UNIT) tablet Take 2,000 Units by mouth daily.     clopidogrel (PLAVIX) 75 MG tablet Take 1 tablet by mouth once daily 90 tablet 0   cyclobenzaprine (FLEXERIL) 5 MG tablet Take 1 tablet by mouth three times daily as needed for muscle spasm 90 tablet 0   DULoxetine (CYMBALTA) 60 MG capsule Take 1 capsule by mouth  once daily 90 capsule 0   FARXIGA 10 MG TABS tablet Take 10 mg by mouth daily.     ferrous sulfate 325 (65 FE) MG tablet Take 325 mg by mouth daily with breakfast.     fluticasone (FLONASE) 50 MCG/ACT nasal spray Place 2 sprays into both nostrils daily.  11.1 mL 11   furosemide (LASIX) 20 MG tablet Take 20 mg by mouth 3 (three) times daily as needed for edema or fluid. 3 times a week as needed for swelling     metFORMIN (GLUCOPHAGE) 500 MG tablet Take 1 tablet (500 mg total) by mouth daily with breakfast. 90 tablet 0   metoprolol succinate (TOPROL-XL) 50 MG 24 hr tablet Take 50 mg by mouth daily.     montelukast (SINGULAIR) 10 MG tablet Take 1 tablet (10 mg total) by mouth at bedtime. 30 tablet 11   pantoprazole (PROTONIX) 40 MG tablet TAKE 1 TABLET BY MOUTH TWICE DAILY BEFORE A MEAL 180 tablet 1   rosuvastatin (CRESTOR) 40 MG tablet Take 1 tablet by mouth once daily 90 tablet 3   Semaglutide (RYBELSUS) 7 MG TABS Take 7 mg by mouth daily. 30 tablet 5   telmisartan (MICARDIS) 80 MG tablet Take 1 tablet (80 mg total) by mouth daily.     No current facility-administered medications for this visit.    Review of Systems: No chest pain. No shortness of breath. No urinary complaints.    Physical Exam  Wt Readings from Last 3 Encounters:  11/29/21 221 lb 6.4 oz (100.4 kg)  11/10/21 220 lb (99.8 kg)  11/06/21 221 lb (100.2 kg)    Ht 5' 5"  (1.651 m)   Wt 221 lb 6.4 oz (100.4 kg)   BMI 36.84 kg/m  Constitutional:  Generally well appearing female in no acute distress. Psychiatric: Pleasant. Normal mood and affect. Behavior is normal. EENT: Pupils normal.  Conjunctivae are normal. No scleral icterus. Neck supple.  Cardiovascular: Normal rate, regular rhythm. No edema Pulmonary/chest: Effort normal and breath sounds normal. No wheezing, rales or rhonchi. Abdominal: Soft, nondistended, nontender. Bowel sounds active throughout. There are no masses palpable. No hepatomegaly. Neurological: Alert and oriented to person place and time. Skin: Skin is warm and dry. No rashes noted.  Tye Savoy, NP  11/29/2021, 2:11 PM  I spent 30 minutes total reviewing records, obtaining history, performing exam, counseling patient and documenting visit /  findings.   Cc:  Binnie Rail, MD

## 2021-12-04 DIAGNOSIS — M7670 Peroneal tendinitis, unspecified leg: Secondary | ICD-10-CM | POA: Insufficient documentation

## 2021-12-12 ENCOUNTER — Other Ambulatory Visit: Payer: Self-pay | Admitting: Internal Medicine

## 2022-01-09 ENCOUNTER — Other Ambulatory Visit: Payer: Self-pay | Admitting: Internal Medicine

## 2022-01-21 ENCOUNTER — Emergency Department (HOSPITAL_BASED_OUTPATIENT_CLINIC_OR_DEPARTMENT_OTHER): Payer: Medicare Other

## 2022-01-21 ENCOUNTER — Emergency Department (HOSPITAL_BASED_OUTPATIENT_CLINIC_OR_DEPARTMENT_OTHER)
Admission: EM | Admit: 2022-01-21 | Discharge: 2022-01-21 | Disposition: A | Discharge status: Home / Self Care | Payer: Medicare Other | Attending: Emergency Medicine | Admitting: Emergency Medicine

## 2022-01-21 ENCOUNTER — Other Ambulatory Visit: Payer: Self-pay | Admitting: Cardiology

## 2022-01-21 ENCOUNTER — Other Ambulatory Visit: Payer: Self-pay

## 2022-01-21 ENCOUNTER — Encounter (HOSPITAL_BASED_OUTPATIENT_CLINIC_OR_DEPARTMENT_OTHER): Payer: Self-pay | Admitting: Emergency Medicine

## 2022-01-21 ENCOUNTER — Encounter (HOSPITAL_COMMUNITY): Payer: Self-pay

## 2022-01-21 DIAGNOSIS — J449 Chronic obstructive pulmonary disease, unspecified: Secondary | ICD-10-CM | POA: Insufficient documentation

## 2022-01-21 DIAGNOSIS — E1122 Type 2 diabetes mellitus with diabetic chronic kidney disease: Secondary | ICD-10-CM | POA: Insufficient documentation

## 2022-01-21 DIAGNOSIS — M25562 Pain in left knee: Secondary | ICD-10-CM

## 2022-01-21 DIAGNOSIS — Z7951 Long term (current) use of inhaled steroids: Secondary | ICD-10-CM | POA: Insufficient documentation

## 2022-01-21 DIAGNOSIS — I129 Hypertensive chronic kidney disease with stage 1 through stage 4 chronic kidney disease, or unspecified chronic kidney disease: Secondary | ICD-10-CM | POA: Diagnosis not present

## 2022-01-21 DIAGNOSIS — M25561 Pain in right knee: Secondary | ICD-10-CM

## 2022-01-21 DIAGNOSIS — R42 Dizziness and giddiness: Secondary | ICD-10-CM | POA: Diagnosis present

## 2022-01-21 DIAGNOSIS — N189 Chronic kidney disease, unspecified: Secondary | ICD-10-CM | POA: Diagnosis not present

## 2022-01-21 DIAGNOSIS — Z79899 Other long term (current) drug therapy: Secondary | ICD-10-CM | POA: Diagnosis not present

## 2022-01-21 DIAGNOSIS — R55 Syncope and collapse: Secondary | ICD-10-CM

## 2022-01-21 DIAGNOSIS — Z7902 Long term (current) use of antithrombotics/antiplatelets: Secondary | ICD-10-CM | POA: Insufficient documentation

## 2022-01-21 DIAGNOSIS — Z7984 Long term (current) use of oral hypoglycemic drugs: Secondary | ICD-10-CM | POA: Diagnosis not present

## 2022-01-21 LAB — CBC
HCT: 39.7 % (ref 36.0–46.0)
Hemoglobin: 12.7 g/dL (ref 12.0–15.0)
MCH: 32 pg (ref 26.0–34.0)
MCHC: 32 g/dL (ref 30.0–36.0)
MCV: 100 fL (ref 80.0–100.0)
Platelets: 246 10*3/uL (ref 150–400)
RBC: 3.97 MIL/uL (ref 3.87–5.11)
RDW: 14 % (ref 11.5–15.5)
WBC: 9.6 10*3/uL (ref 4.0–10.5)
nRBC: 0 % (ref 0.0–0.2)

## 2022-01-21 LAB — TROPONIN I (HIGH SENSITIVITY): Troponin I (High Sensitivity): 6 ng/L (ref <18)

## 2022-01-21 LAB — BASIC METABOLIC PANEL
Anion gap: 6 (ref 5–15)
BUN: 18 mg/dL (ref 8–23)
CO2: 26 mmol/L (ref 22–32)
Calcium: 8.5 mg/dL — ABNORMAL LOW (ref 8.9–10.3)
Chloride: 106 mmol/L (ref 98–111)
Creatinine, Ser: 1.66 mg/dL — ABNORMAL HIGH (ref 0.44–1.00)
GFR, Estimated: 32 mL/min — ABNORMAL LOW (ref >60)
Glucose, Bld: 145 mg/dL — ABNORMAL HIGH (ref 70–99)
Potassium: 4.1 mmol/L (ref 3.5–5.1)
Sodium: 138 mmol/L (ref 135–145)

## 2022-01-21 LAB — MAGNESIUM: Magnesium: 2 mg/dL (ref 1.7–2.4)

## 2022-01-21 LAB — CBG MONITORING, ED: Glucose-Capillary: 156 mg/dL — ABNORMAL HIGH (ref 70–99)

## 2022-01-21 MED ORDER — ACETAMINOPHEN 500 MG PO TABS
1000.0000 mg | ORAL_TABLET | ORAL | Status: AC
  Administered 2022-01-21: 1000 mg via ORAL
  Filled 2022-01-21: qty 2

## 2022-01-21 NOTE — ED Provider Notes (Signed)
[facility] Provider Note   CSN: [id number] Arrival date & time: 01/21/22  6759     History  Chief Complaint  Patient presents with   Loss of Consciousness    Kerry Powell is a 76 y.o. female.  76 year old female with a history of CKD, COPD, DM 2, HTN, HLD, GERD, CVA without residual deficits, and aortic aneurysm who presents to the emergency department with a syncopal event.  Says that this morning she was walking about to go up the stairs when she felt dizzy and legs felt weak and then she lost consciousness.  It was witnessed by her son who stated that she fell backwards and hit her head.  Denies any preceding chest pain or shortness of breath.  Says that since then has been having bilateral knee pain left worse than right but that she was able to bear weight.  No head pain or neck pain.  Unsure if she is on blood thinners.  No recent illnesses or medication changes been taking adequate p.o. intake recently.        Home Medications Prior to Admission medications   Medication Sig Start Date End Date Taking? Authorizing Provider  ACETAMINOPHEN-BUTALBITAL 50-325 MG TABS TAKE 1 TABLET BY MOUTH EVERY DAY AS NEEDED 01/24/21   Binnie Rail, MD  albuterol (PROVENTIL) (2.5 MG/3ML) 0.083% nebulizer solution Take 3 mLs (2.5 mg total) by nebulization every 6 (six) hours as needed for wheezing or shortness of breath. 11/06/21   Spero Geralds, MD  allopurinol (ZYLOPRIM) 300 MG tablet Take 1 tablet by mouth once daily 10/03/21   Binnie Rail, MD  amLODipine (NORVASC) 5 MG tablet Take 1 tablet (5 mg total) by mouth daily. 11/10/21   Binnie Rail, MD  cetirizine (ZYRTEC ALLERGY) 10 MG tablet 10 mg by oral route. 05/06/19   [provider]  cholecalciferol (VITAMIN D3) 25 MCG (1000 UNIT) tablet Take 2,000 Units by mouth daily.    [provider]  clopidogrel (PLAVIX) 75 MG tablet Take 1 tablet by mouth once daily 12/13/21   Binnie Rail, MD   cyclobenzaprine (FLEXERIL) 5 MG tablet Take 1 tablet by mouth three times daily as needed for muscle spasm 12/13/21   Binnie Rail, MD  DULoxetine (CYMBALTA) 60 MG capsule Take 1 capsule by mouth once daily 09/12/20   Burns, [name], MD  FARXIGA 10 MG TABS tablet Take 10 mg by mouth daily. 03/29/21   [provider]  ferrous sulfate 325 (65 FE) MG tablet Take 325 mg by mouth daily with breakfast.    [provider]  fluticasone (FLONASE) 50 MCG/ACT nasal spray Place 2 sprays into both nostrils daily. 11/06/21   Spero Geralds, MD  furosemide (LASIX) 20 MG tablet Take 20 mg by mouth 3 (three) times daily as needed for edema or fluid. 3 times a week as needed for swelling    [provider]  metFORMIN (GLUCOPHAGE) 500 MG tablet Take 1 tablet (500 mg total) by mouth daily with breakfast. 07/26/21   Binnie Rail, MD  metoprolol succinate (TOPROL-XL) 50 MG 24 hr tablet Take 50 mg by mouth daily. 12/30/19   [provider]  montelukast (SINGULAIR) 10 MG tablet TAKE 1 TABLET BY MOUTH ONCE DAILY AT BEDTIME 01/09/22   Binnie Rail, MD  pantoprazole (PROTONIX) 40 MG tablet TAKE 1 TABLET BY MOUTH TWICE DAILY BEFORE A MEAL 09/15/21   Burns, [name], MD  rosuvastatin (CRESTOR) 40  MG tablet Take 1 tablet by mouth once daily 10/03/21   Turner, Eber Hong, MD  Semaglutide (RYBELSUS) 7 MG TABS Take 7 mg by mouth daily. 07/26/21   Binnie Rail, MD  telmisartan (MICARDIS) 80 MG tablet Take 1 tablet (80 mg total) by mouth daily. 05/10/21   Binnie Rail, MD      Allergies    Gabapentin, Clindamycin, and Sulfa antibiotics    Review of Systems   Review of Systems  Physical Exam Updated Vital Signs BP 134/78   Pulse 83   Temp 98 F (36.7 C) (Oral)   Resp 20   Ht '5\' 5"'$  (1.651 m)   Wt 99.8 kg   SpO2 91%   BMI 36.61 kg/m  Physical Exam Vitals and nursing note reviewed.  Constitutional:      General: She is not in acute distress.    Appearance: She is well-developed.   HENT:     Head: Normocephalic and atraumatic.     Right Ear: External ear normal.     Left Ear: External ear normal.     Nose: Nose normal.  Eyes:     Extraocular Movements: Extraocular movements intact.     Conjunctiva/sclera: Conjunctivae normal.     Pupils: Pupils are equal, round, and reactive to light.     Comments: No nystagmus noted.  Pupils 3 mm and reactive bilaterally  Neck:     Comments: No cervical, thoracic, or lumbar spinal tenderness to palpation Cardiovascular:     Rate and Rhythm: Normal rate and regular rhythm.     Heart sounds: No murmur heard.    Comments: Radial pulses 2+ bilaterally Pulmonary:     Effort: Pulmonary effort is normal. No respiratory distress.     Breath sounds: Normal breath sounds.  Abdominal:     General: Abdomen is flat. There is no distension.     Palpations: Abdomen is soft. There is no mass.     Tenderness: There is no abdominal tenderness. There is no guarding.  Musculoskeletal:        General: No swelling.     Cervical back: Normal range of motion and neck supple.     Right lower leg: No edema.     Left lower leg: No edema.     Comments: Bilateral medial and patellar tenderness to palpation of the knees right worse than left.  No tenderness to palpation of hips and pelvis stable.  Skin:    General: Skin is warm and dry.     Capillary Refill: Capillary refill takes less than 2 seconds.  Neurological:     General: No focal deficit present.     Mental Status: She is alert and oriented to person, place, and time. Mental status is at baseline.     Cranial Nerves: No cranial nerve deficit.     Sensory: No sensory deficit.     Motor: No weakness.     Comments: No dysmetria  Psychiatric:        Mood and Affect: Mood normal.     ED Results / Procedures / Treatments   Labs (all labs ordered are listed, but only abnormal results are displayed) Labs Reviewed  BASIC METABOLIC PANEL - Abnormal; Notable for the following components:       Result Value   Glucose, Bld 145 (*)    Creatinine, Ser 1.66 (*)    Calcium 8.5 (*)    GFR, Estimated 32 (*)    All other components within normal limits  CBG MONITORING, ED - Abnormal; Notable for the following components:   Glucose-Capillary 156 (*)    All other components within normal limits  CBC  MAGNESIUM  TROPONIN I (HIGH SENSITIVITY)    EKG EKG Interpretation  Date/Time:  Sunday January 21 2022 09:43:54 EST Ventricular Rate:  79 PR Interval:  217 QRS Duration: 96 QT Interval:  386 QTC Calculation: 443 R Axis:   59 Text Interpretation: Sinus rhythm Borderline prolonged PR interval Confirmed by Margaretmary Eddy [phone]) on 01/21/2022 10:01:40 AM  Radiology CT NO CHARGE  Result Date: 01/21/2022 CLINICAL DATA:  Left knee pain. EXAM: CT OF THE LOWER LEFT EXTREMITY WITHOUT CONTRAST TECHNIQUE: Multidetector CT imaging of the lower left extremity was performed according to the standard protocol. RADIATION DOSE REDUCTION: This exam was performed according to the departmental dose-optimization program which includes automated exposure control, adjustment of the mA and/or kV according to patient size and/or use of iterative reconstruction technique. COMPARISON:  Radiographs, same date. FINDINGS: Moderate tricompartmental degenerative changes with joint space narrowing, osteophytic spurring and subchondral cystic change. No acute fracture is identified. Moderate-sized joint effusion noted. There is a small to moderate-sized Baker cyst which contains a ossified loose body. Grossly by CT the cruciate in collateral ligaments are intact and the quadriceps and patellar tendons are intact. IMPRESSION: 1. Moderate tricompartmental degenerative changes but no acute fracture. 2. Moderate-sized joint effusion and small to moderate-sized Baker cyst containing a ossified loose body. 3. Grossly by CT the cruciate and collateral ligaments are intact. Electronically Signed   By: Marijo Sanes M.D.   On:  01/21/2022 12:22   CT Cervical Spine Wo Contrast  Result Date: 01/21/2022 CLINICAL DATA:  76 year old female status post syncope and witnessed fall. Struck back of head. Pain. EXAM: CT CERVICAL SPINE WITHOUT CONTRAST TECHNIQUE: Multidetector CT imaging of the cervical spine was performed without intravenous contrast. Multiplanar CT image reconstructions were also generated. RADIATION DOSE REDUCTION: This exam was performed according to the departmental dose-optimization program which includes automated exposure control, adjustment of the mA and/or kV according to patient size and/or use of iterative reconstruction technique. COMPARISON:  Cervical spine MRI 05/30/2019. FINDINGS: Alignment: Straightening of cervical lordosis not significantly changed from the 2021 MRI. Cervicothoracic junction alignment is within normal limits. Bilateral posterior element alignment is within normal limits. Skull base and vertebrae: Visualized skull base is intact. No atlanto-occipital dissociation. Maintained C1-C2 alignment although severe bilateral joint space loss, see additional details below. No acute osseous abnormality identified. Soft tissues and spinal canal: No prevertebral fluid or swelling. No visible canal hematoma. Calcified carotid atherosclerosis with a partially retropharyngeal course of the carotids. Otherwise negative visible noncontrast neck soft tissues. Disc levels: Multilevel mild degenerative appearing cervical spine anterolisthesis is more apparent than in 2021. And there is widespread severe bilateral cervical facet arthropathy. Bulky and sclerotic bilateral facets with some evidence of developing facet ankylosis (on the left C2-C3). And similar severe bilateral C1-C2 joint space loss, bulky osteophytosis and sclerosis. No strong CT evidence of cervical spinal stenosis. Upper chest: Grossly intact visible upper thoracic levels, with similar mild anterolisthesis, facet arthropathy at T1-T2. Apical lung  scarring. Partially visible Calcified aortic atherosclerosis. IMPRESSION: 1. No acute traumatic injury identified in the cervical spine. 2. Chronic widespread and severe cervical facet arthropathy in the setting of multilevel mild spondylolisthesis. Chronic severe C1-C2 degeneration. 3.  Aortic Atherosclerosis (ICD10-I70.0). Electronically Signed   By: Genevie Ann M.D.   On: 01/21/2022 11:28   CT Head Wo Contrast  Result Date:  01/21/2022 CLINICAL DATA:  76 year old female status post syncope and witnessed fall. Struck back of head. Pain. EXAM: CT HEAD WITHOUT CONTRAST TECHNIQUE: Contiguous axial images were obtained from the base of the skull through the vertex without intravenous contrast. RADIATION DOSE REDUCTION: This exam was performed according to the departmental dose-optimization program which includes automated exposure control, adjustment of the mA and/or kV according to patient size and/or use of iterative reconstruction technique. COMPARISON:  Head CT 06/27/2013.  Brain MRI 06/09/2017. FINDINGS: Brain: Chronic lacunar infarct right corona radiata. Associated less pronounced bilateral basal ganglia heterogeneity seems mildly progressed since the 2019 MRI. Cerebral volume remains normal for age. No superimposed No midline shift, ventriculomegaly, mass effect, evidence of mass lesion, intracranial hemorrhage or evidence of cortically based acute infarction. No cortical encephalomalacia identified. Vascular: Calcified atherosclerosis at the skull base. No suspicious intracranial vascular hyperdensity. Skull: Stable, intact. Sinuses/Orbits: Visualized paranasal sinuses and mastoids are stable and well aerated. Other: No orbit or scalp soft tissue injury identified. IMPRESSION: 1. No acute intracranial abnormality or acute traumatic injury identified. 2. Chronic small vessel ischemia with some evidence of progression since the 2019 MRI. Electronically Signed   By: Genevie Ann M.D.   On: 01/21/2022 11:25   CT  KNEE RIGHT WO CONTRAST  Result Date: 01/21/2022 CLINICAL DATA:  [place]. Right knee pain. EXAM: CT OF THE RIGHT KNEE WITHOUT CONTRAST TECHNIQUE: Multidetector CT imaging of the right knee was performed according to the standard protocol. Multiplanar CT image reconstructions were also generated. RADIATION DOSE REDUCTION: This exam was performed according to the departmental dose-optimization program which includes automated exposure control, adjustment of the mA and/or kV according to patient size and/or use of iterative reconstruction technique. COMPARISON:  Radiographs, same date. FINDINGS: Mild tricompartmental degenerative changes but no acute fracture or osteochondral lesion. Small joint effusion noted. Grossly by CT the cruciate and collateral ligaments are intact. The quadriceps and patellar tendons are intact. The knee musculature is grossly normal. IMPRESSION: 1. Mild tricompartmental degenerative changes but no acute fracture or osteochondral lesion. 2. Small joint effusion. Electronically Signed   By: Marijo Sanes M.D.   On: 01/21/2022 11:19   DG Knee Complete 4 Views Left  Result Date: 01/21/2022 CLINICAL DATA:  Fall EXAM: LEFT KNEE - COMPLETE 4+ VIEW; RIGHT KNEE - COMPLETE 4+ VIEW COMPARISON:  None Available. FINDINGS: No evidence of fracture, dislocation, or joint effusion. There is a small effusion bilaterally. There is bilateral medial compartment osteophyte formation, sclerosis and joint space narrowing consistent with osteoarthritis, more severe on the left. IMPRESSION: Degenerative changes.  Small effusions. Electronically Signed   By: Sammie Bench M.D.   On: 01/21/2022 11:12   DG Knee Complete 4 Views Right  Result Date: 01/21/2022 CLINICAL DATA:  Fall EXAM: LEFT KNEE - COMPLETE 4+ VIEW; RIGHT KNEE - COMPLETE 4+ VIEW COMPARISON:  None Available. FINDINGS: No evidence of fracture, dislocation, or joint effusion. There is a small effusion bilaterally. There is bilateral medial  compartment osteophyte formation, sclerosis and joint space narrowing consistent with osteoarthritis, more severe on the left. IMPRESSION: Degenerative changes.  Small effusions. Electronically Signed   By: Sammie Bench M.D.   On: 01/21/2022 11:12   DG Chest 1 View  Result Date: 01/21/2022 CLINICAL DATA:  Syncope EXAM: CHEST  1 VIEW COMPARISON:  04/18/2021 and prior studies FINDINGS: Telemetry leads overlie the chest. The cardiomediastinal silhouette is unremarkable. Aortic atherosclerotic calcifications again noted. There is no evidence of focal airspace disease, pulmonary edema, suspicious pulmonary nodule/mass, pleural  effusion, or pneumothorax. No acute bony abnormalities are identified. IMPRESSION: No active disease. Electronically Signed   By: Margarette Canada M.D.   On: 01/21/2022 11:10    Procedures Procedures   Medications Ordered in ED Medications  acetaminophen (TYLENOL) tablet 1,000 mg (1,000 mg Oral Given 01/21/22 1116)    ED Course/ Medical Decision Making/ A&P Clinical Course as of 01/22/22 1519  Sun Jan 21, 2022  1136 CT and x-rays without traumatic injury. [RP]  1207 Dr Domenic Polite from cardiology was consulted and recommends outpatient follow-up with orthostatics today.  Also will attempt to arrange for Holter monitor to be sent to the patient. [RP]    Clinical Course User Index [RP] Fransico Meadow, MD                           Medical Decision Making Amount and/or Complexity of Data Reviewed Labs: ordered. Radiology: ordered.  Risk OTC drugs.   Kerry Powell is a 76 y.o. female with comorbidities that complicate the patient evaluation including CKD, COPD, DM 2, HTN, HLD, GERD, CVA without residual deficits, and aortic aneurysm who presents to the emergency department with a syncopal event.   Initial Ddx:  MI, PE, aortic dissection, medication side effect, orthostasis, cardiogenic syncope/arrhythmia, ICH, C-spine injury, knee fracture  MDM:  Unclear what  caused the patient's syncopal event.  May be reflective of orthostasis but reports that she was walking when this occurred instead of rising from a seated position.  With the concern for cardiogenic syncope will obtain troponins and EKG.  Considered dissection but again no chest pain and she has symmetric pulses without any murmur so feel this is highly unlikely.  Considered MI and PE but since patient is not having any chest pain or active shortness of breath feel these are less likely.  No recent medication changes that would explain her symptoms.  Will obtain x-rays of the patient's knees as well as a CT of the head and C-spine to evaluate for injury.  Plan:  Labs EKG Troponin Chest x-ray CT head Knee x-rays  ED Summary/Re-evaluation:  Patient underwent the above evaluation that was reassuring.  Her traumatic work-up was negative.  Was having significant knee pain still so CT scans were obtained which did not show an occult fracture.  Patient was able to ambulate in the emergency department but did have significant knee pain so was given a walker to go home with.  Did discuss her case with cardiology who would like to see her in clinic and give her a Zio patch at home.  Instructed her to follow-up with her primary doctor in several days.  This patient presents to the ED for concern of complaints listed in HPI, this involves an extensive number of treatment options, and is a complaint that carries with it a high risk of complications and morbidity. Disposition including potential need for admission considered.   Dispo: DC Home. Return precautions discussed including, but not limited to, those listed in the AVS. Allowed pt time to ask questions which were answered fully prior to dc.  Additional history obtained from son Records reviewed Outpatient Clinic Notes The following labs were independently interpreted: Chemistry and show CKD I independently reviewed the following imaging with scope of  interpretation limited to determining acute life threatening conditions related to emergency care: CT Head, which revealed no acute abnormality  I personally reviewed and interpreted cardiac monitoring: normal sinus rhythm  I personally reviewed  and interpreted the pt's EKG: see above for interpretation  I have reviewed the patients home medications and made adjustments as needed Consults: Cardiology  Final Clinical Impression(s) / ED Diagnoses Final diagnoses:  Syncope and collapse  Acute pain of both knees    Rx / DC Orders ED Discharge Orders          Ordered    Ambulatory referral to Cardiology        01/21/22 1259              Fransico Meadow, MD 01/22/22 1519

## 2022-01-21 NOTE — ED Notes (Signed)
ED Provider at bedside. 

## 2022-01-21 NOTE — ED Triage Notes (Signed)
Patient reports "I blacked out while walking to my bedroom last night." Family member at bedside witnessed patient fall, reports patient fell onto knees and then fell backwards hitting her head. Patient c/o pain to bilateral knees denies pain to head.

## 2022-01-21 NOTE — ED Notes (Signed)
Pt ambulated with walker in room; she has some pain with ambulation, but was able to manage with minimal assistance; she has family at home to help her as needed.

## 2022-01-21 NOTE — Discharge Instructions (Addendum)
You were seen in the emergency department after you passed out.  He had an EKG and blood work that was reassuring.  You had CT scans that did not show any broken bones.  Please use Tylenol, ice, and over-the-counter topical pain medications for your knee pain.  Cardiology will be sending you a monitor in the mail and if you do not hear from them within 72 hours please call them using the number in this packet to set up an appointment.  Follow-up with your primary doctor in 2 to 3 days.  Return to the emergency department if you experience any of the following: Chest pain, shortness of breath, repeated fainting, or any other concerning symptoms.  Dr. Theodosia Blender office will be in touch to have you wear a monitor for 2 weeks.   This is to see if an irregular heart beat of some type may have caused you to pass out.   You should hear from them this week.      Heart Monitor:   Length of Wear: 14 days   Your monitor will be mailed to your home address within 3-5 business days. However, if you have not received your monitor after 5 business days please send [place] a MyChart message or call the office at [phone], so we may follow up on this for you.    Your physician has recommended that you wear a Zio AT monitor.    This monitor is a medical device that records the heart's electrical activity. Doctors most often use these monitors to diagnose arrhythmias. Arrhythmias are problems with the speed or rhythm of the heartbeat. The monitor is a small device applied to your chest. You can wear one while you do your normal daily activities. While wearing this monitor if you have any symptoms to push the button and record what you felt. Once you have worn this monitor for the period of time provider prescribed (Usually 14 days), you will return the monitor device in the postage paid box. Once it is returned they will download the data collected and provide [place] with a report which the provider will then review and we  will call you with those results. Important tips:   1. Avoid showering during the first 24 hours of wearing the monitor. 2. Avoid excessive sweating to help maximize wear time. 3. Do not submerge the device, no hot tubs, and no swimming pools. 4. Keep any lotions or oils away from the patch. 5. After 24 hours you may shower with the patch on. Take brief showers with your back facing the shower head.  6. Do not remove patch once it has been placed because that will interrupt data and decrease adhesive wear time. 7. Push the button when you have any symptoms and write down what you were feeling. 8. Once you have completed wearing your monitor, remove and place into box which has postage paid and place in your outgoing mailbox.  9. If for some reason you have misplaced your box then call our office and we can provide another box and/or mail it off for you.

## 2022-01-21 NOTE — ED Notes (Signed)
Patient transported to CT, XR

## 2022-01-21 NOTE — ED Notes (Signed)
Ice packs applied to bil knees

## 2022-01-21 NOTE — ED Notes (Signed)
While completing orthostatics, this EMT noticed that her SpO2 levels would get down to 84-88% upon position changes and/or exertion. Her SpO2 levels would slowly rebound to her baseline, but would take 30 seconds to 1 min to get back to normal. Hx of COPD.

## 2022-01-22 ENCOUNTER — Other Ambulatory Visit (INDEPENDENT_AMBULATORY_CARE_PROVIDER_SITE_OTHER): Payer: Medicare Other

## 2022-01-22 DIAGNOSIS — R55 Syncope and collapse: Secondary | ICD-10-CM

## 2022-01-22 NOTE — Progress Notes (Unsigned)
Enrolled for [employer] to mail a ZIO AT Live Telemetry monitor to patients address on file.   Dr. Radford Pax to read.

## 2022-01-25 ENCOUNTER — Telehealth: Payer: Self-pay

## 2022-01-25 DIAGNOSIS — R55 Syncope and collapse: Secondary | ICD-10-CM

## 2022-01-25 NOTE — Telephone Encounter (Signed)
Transition Care Management Follow-up Telephone Call Date of discharge and from where: January 21, 2022  [facility] How have you been since you were released from the hospital? Doing better Any questions or concerns? No  Items Reviewed: Did the pt receive and understand the discharge instructions provided? Yes  Medications obtained and verified? Yes  Other? No  Any new allergies since your discharge? No  Dietary orders reviewed? No Do you have support at home? Yes   Home Care and Equipment/Supplies: Were home health services ordered? no If so, what is the name of the agency? N/A  Has the agency set up a time to come to the patient's home? not applicable Were any new equipment or medical supplies ordered?  No What is the name of the medical supply agency? N/A Were you able to get the supplies/equipment? not applicable Do you have any questions related to the use of the equipment or supplies? No  Functional Questionnaire: (I = Independent and D = Dependent) ADLs: I  Bathing/Dressing- I  Meal Prep- I  Eating- I  Maintaining continence- I  Transferring/Ambulation- I  Managing Meds- I  Follow up appointments reviewed:  PCP Hospital f/u appt confirmed? Yes  Scheduled to see Dr. Quay Burow on 02/02/22 @ 3:20 pm. [facility] f/u appt confirmed? No   Are transportation arrangements needed? No  If their condition worsens, is the pt aware to call PCP or go to the Emergency Dept.? Yes Was the patient provided with contact information for the PCP's office or ED? Yes Was to pt encouraged to call back with questions or concerns? Yes

## 2022-01-29 ENCOUNTER — Encounter: Payer: Self-pay | Admitting: Internal Medicine

## 2022-02-01 DIAGNOSIS — R55 Syncope and collapse: Secondary | ICD-10-CM | POA: Insufficient documentation

## 2022-02-01 NOTE — Progress Notes (Unsigned)
Subjective:    Patient ID: Kerry Powell, female    DOB: 24-Feb-1946, 76 y.o.   MRN: [id number]     HPI Kerry Powell is here for follow up from the ED.  ED 11/26 for LOC - syncopal event.  She was at home about to go up stairs and felt dizzy, legs were weak and she lost consciousness.  Her son witnessed it and said she fell backwards and hit her head.  No preceding CP, Palps, SOB.  Has chronic b/l knee pain.  Denied head, neck pain.   Labs, including troponin, cxr, xray of knees, Ct r knee, Ct head, c spine neg for acute injury.    Cardiology gave her zio patch.  She has f/u cardiology on 12/28.    Has had a few episodes - occur shortly after standing and walking  - feels lightheaded just before falling      Medications and allergies reviewed with patient and updated if appropriate.  Current Outpatient Medications on File Prior to Visit  Medication Sig Dispense Refill   ACETAMINOPHEN-BUTALBITAL 50-325 MG TABS TAKE 1 TABLET BY MOUTH EVERY DAY AS NEEDED 60 tablet 3   albuterol (PROVENTIL) (2.5 MG/3ML) 0.083% nebulizer solution Take 3 mLs (2.5 mg total) by nebulization every 6 (six) hours as needed for wheezing or shortness of breath. 75 mL 12   allopurinol (ZYLOPRIM) 300 MG tablet Take 1 tablet by mouth once daily 90 tablet 0   amLODipine (NORVASC) 5 MG tablet Take 1 tablet (5 mg total) by mouth daily. 90 tablet 1   cetirizine (ZYRTEC ALLERGY) 10 MG tablet 10 mg by oral route.     cholecalciferol (VITAMIN D3) 25 MCG (1000 UNIT) tablet Take 2,000 Units by mouth daily.     clopidogrel (PLAVIX) 75 MG tablet Take 1 tablet by mouth once daily 90 tablet 0   cyclobenzaprine (FLEXERIL) 5 MG tablet Take 1 tablet by mouth three times daily as needed for muscle spasm 90 tablet 0   DULoxetine (CYMBALTA) 30 MG capsule Take 30 mg by mouth daily.     DULoxetine (CYMBALTA) 60 MG capsule Take 1 capsule by mouth once daily 90 capsule 0   FARXIGA 10 MG TABS tablet Take 10 mg by mouth daily.     ferrous  sulfate 325 (65 FE) MG tablet Take 325 mg by mouth daily with breakfast.     fluticasone (FLONASE) 50 MCG/ACT nasal spray Place 2 sprays into both nostrils daily. 11.1 mL 11   furosemide (LASIX) 20 MG tablet Take 20 mg by mouth 3 (three) times daily as needed for edema or fluid. 3 times a week as needed for swelling     metFORMIN (GLUCOPHAGE) 500 MG tablet Take 1 tablet (500 mg total) by mouth daily with breakfast. 90 tablet 0   metoprolol succinate (TOPROL-XL) 50 MG 24 hr tablet Take 50 mg by mouth daily.     montelukast (SINGULAIR) 10 MG tablet TAKE 1 TABLET BY MOUTH ONCE DAILY AT BEDTIME 30 tablet 0   pantoprazole (PROTONIX) 40 MG tablet TAKE 1 TABLET BY MOUTH TWICE DAILY BEFORE A MEAL 180 tablet 1   rosuvastatin (CRESTOR) 40 MG tablet Take 1 tablet by mouth once daily 90 tablet 3   Semaglutide (RYBELSUS) 7 MG TABS Take 7 mg by mouth daily. 30 tablet 5   telmisartan (MICARDIS) 80 MG tablet Take 1 tablet (80 mg total) by mouth daily.     No current facility-administered medications on file prior to visit.  Review of Systems  Constitutional:  Negative for fever.  Respiratory:  Positive for shortness of breath (with moderate exertion).   Cardiovascular:  Positive for palpitations. Negative for chest pain.  Neurological:  Positive for light-headedness (occ). Negative for headaches.       Objective:   Vitals:   02/02/22 1531  BP: 126/88  Pulse: 92  Temp: 98 F (36.7 C)  SpO2: 95%   BP Readings from Last 3 Encounters:  02/02/22 126/88  01/21/22 134/78  11/29/21 120/80   Wt Readings from Last 3 Encounters:  02/02/22 228 lb (103.4 kg)  01/21/22 220 lb (99.8 kg)  11/29/21 221 lb 6.4 oz (100.4 kg)   Body mass index is 37.94 kg/m.    Physical Exam Constitutional:      General: She is not in acute distress.    Appearance: Normal appearance.  HENT:     Head: Normocephalic and atraumatic.  Eyes:     Conjunctiva/sclera: Conjunctivae normal.  Cardiovascular:     Rate and  Rhythm: Normal rate and regular rhythm.     Heart sounds: Normal heart sounds. No murmur heard. Pulmonary:     Effort: Pulmonary effort is normal. No respiratory distress.     Breath sounds: Normal breath sounds. No wheezing.  Musculoskeletal:     Cervical back: Neck supple.     Right lower leg: No edema.     Left lower leg: No edema.  Lymphadenopathy:     Cervical: No cervical adenopathy.  Skin:    General: Skin is warm and dry.     Findings: No rash.  Neurological:     Mental Status: She is alert. Mental status is at baseline.  Psychiatric:        Mood and Affect: Mood normal.        Behavior: Behavior normal.        Lab Results  Component Value Date   WBC 9.6 01/21/2022   HGB 12.7 01/21/2022   HCT 39.7 01/21/2022   PLT 246 01/21/2022   GLUCOSE 145 (H) 01/21/2022   CHOL 139 11/10/2021   TRIG 141.0 11/10/2021   HDL 41.70 11/10/2021   LDLDIRECT 79.0 08/01/2020   LDLCALC 69 11/10/2021   ALT 18 11/10/2021   AST 20 11/10/2021   NA 138 01/21/2022   K 4.1 01/21/2022   CL 106 01/21/2022   CREATININE 1.66 (H) 01/21/2022   BUN 18 01/21/2022   CO2 26 01/21/2022   TSH 3.48 08/01/2020   INR 0.91 06/30/2009   HGBA1C 7.0 (H) 11/10/2021     Assessment & Plan:    See Problem List for Assessment and Plan of chronic medical problems.

## 2022-02-02 ENCOUNTER — Ambulatory Visit: Payer: Medicare Other | Admitting: Internal Medicine

## 2022-02-02 ENCOUNTER — Encounter: Payer: Self-pay | Admitting: Internal Medicine

## 2022-02-02 VITALS — BP 126/88 | HR 92 | Temp 98.0°F | Ht 65.0 in | Wt 228.0 lb

## 2022-02-02 DIAGNOSIS — I10 Essential (primary) hypertension: Secondary | ICD-10-CM | POA: Diagnosis not present

## 2022-02-02 DIAGNOSIS — R55 Syncope and collapse: Secondary | ICD-10-CM

## 2022-02-02 DIAGNOSIS — E538 Deficiency of other specified B group vitamins: Secondary | ICD-10-CM | POA: Diagnosis not present

## 2022-02-02 MED ORDER — CYANOCOBALAMIN 1000 MCG/ML IJ SOLN
1000.0000 ug | Freq: Once | INTRAMUSCULAR | Status: AC
  Administered 2022-02-02: 1000 ug via INTRAMUSCULAR

## 2022-02-02 NOTE — Patient Instructions (Addendum)
        Medications changes include :   none      Return for follow up as scheduled.  

## 2022-02-02 NOTE — Assessment & Plan Note (Signed)
Chronic Due for B12 B12 injection today

## 2022-02-03 NOTE — Assessment & Plan Note (Signed)
Chronic Well controlled No orthostasis in the ED Continue current medications - amlodipine 5 mg daily, metoprolol xl 50 mg daily, telmisartan 80 mg daily

## 2022-02-03 NOTE — Assessment & Plan Note (Signed)
Has had a few episodes - most recent the end of November - work up in ED reassuring Has ziopatch on now and has f/u with cardio Not orthostatic in ED Unlikely related to low sugars Will await ziopath/cardio evaluation

## 2022-02-19 ENCOUNTER — Other Ambulatory Visit: Payer: Self-pay | Admitting: Internal Medicine

## 2022-02-21 NOTE — Progress Notes (Unsigned)
Office Visit    Patient Name: Kerry Powell Date of Encounter: 02/21/2022  Primary Care Provider:  Binnie Rail, MD Primary Cardiologist:  Fransico Him, MD Primary Electrophysiologist: None  Chief Complaint    Kerry Powell is a 76 y.o. female with PMH of thoracic aortic aneurysm, CVA, PVD, HLD, DM type II, CKD, COPD, bipolar disorder who presents today for posthospital follow-up for loss of consciousness.  Past Medical History    Past Medical History:  Diagnosis Date   Allergy    Anemia    Anxiety    Arthritis    BACK PAIN, CHRONIC 04/04/2007   Qualifier: Diagnosis of  By: Carlean Purl MD, FACG, [place] AFFECTIVE DISORDER 04/04/2007   Qualifier: History of  By: Carlean Purl MD, Tonna Boehringer E    CAD (coronary artery disease), native coronary artery    mild CAD with no obstructive disease by coronary CTA 06/2019   Cataract    removed both eyes   Chronic neck pain 04/30/2015   Cervical spondylosis [facility] Intermittent numbness and tingling in occipital region and headaches S/p 2 occipital nerve blocks - only temp relief no relief with gabapentin, ultram, brace MRI neck - facet arthrosis    CKD (chronic kidney disease) 04/26/2015   COPD (chronic obstructive pulmonary disease) ([place])    pt denies - told this while was smoking -   Cough 04/26/2015   Depression 07/01/2017   Diabetes ([place]) 04/27/2015   Diagnosed 03/2015    Dizziness 06/10/2017   Dysphagia 04/26/2015   DYSPNEA 12/24/2007   Qualifier: Diagnosis of  By: Glennis Brink     Elevated TSH 08/09/2015   GASTRIC ULCER, ACUTE 04/07/2007   Annotation: EGD 04/07/07 Qualifier: Diagnosis of  By: Carlean Purl MD, Tonna Boehringer E    GASTRITIS 04/07/2007   Annotation: EGD 04/07/07 Qualifier: Diagnosis of  By: Carlean Purl MD, Tonna Boehringer E    GERD (gastroesophageal reflux disease)    H/O renal cell carcinoma 05/23/2017   Dx 2010, s/p cryoablation Follows with urology - Dr Lovena Neighbours   HIATAL HERNIA 04/07/2007   Annotation: EGD 04/07/07  Qualifier: Diagnosis of  By: Carlean Purl MD, Tonna Boehringer E    History of CVA (cerebrovascular accident) 04/26/2015   History of gout 04/26/2015   Hyperlipidemia    Hypertension    Irritable bowel syndrome 04/04/2007   Qualifier: Diagnosis of  By: Carlean Purl MD, Tonna Boehringer E    Kidney tumor (benign)    Nausea and vomiting 09/09/2017   Occipital neuralgia 05/09/2016   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 Dr Dimas Millin in [place] 04/04/2007   Qualifier: Diagnosis of  By: Carlean Purl MD, Dimas Millin    Personal history of colonic adenoma 06/07/2012   POSTNASAL DRIP SYNDROME 12/29/2007   Qualifier: Diagnosis of  By: Chase Caller MD, [name]     RESTLESS LEG SYNDROME 04/04/2007   Qualifier: Diagnosis of  By: Carlean Purl MD, FACG, [place], 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   Qualifier: Diagnosis of  By: Carlean Purl MD, Dimas Millin    Sleep difficulties 09/09/2017   Snoring 04/26/2015   Stroke ([place])    x2 2011   Thoracic aortic aneurysm ([place]) 02/06/2018   Seen on MRI 01/25/18 of thoracic spine ordered by [facility] -- referred to CTS to monitor  -- 3.5 cm   TIA (transient ischemic attack)    TOBACCO ABUSE 12/17/2007   Qualifier: Diagnosis of  By: Chase Caller MD, Belva Crome  Trigeminal neuralgia of right side of face 02/08/2016   URI (upper respiratory infection) 02/08/2016   Vitamin D deficiency 03/12/2018   Past Surgical History:  Procedure Laterality Date   ABDOMINAL HYSTERECTOMY     APPENDECTOMY     CATARACT EXTRACTION     COLONOSCOPY     CYSTOCELE REPAIR     ESOPHAGOGASTRODUODENOSCOPY     KIDNEY SURGERY     benign tumor removal   ORTHOPEDIC SURGERY     Wrist & elbow   POLYPECTOMY     RECTOCELE REPAIR     TONSILECTOMY, ADENOIDECTOMY, BILATERAL MYRINGOTOMY AND TUBES Bilateral 1968   UPPER GASTROINTESTINAL ENDOSCOPY      Allergies  Allergies  Allergen Reactions   Gabapentin Other (See Comments)    Fluid retention    Clindamycin Rash   Sulfa Antibiotics Nausea And Vomiting    History of  Present Illness    Kerry Powell  is a 76 year old female with the above mention past medical history who presents today for posthospital follow-up of loss of consciousness.  Kerry Powell was initially seen by Dr. Radford Pax in 2020 for complaint of shortness of breath.  2D echo was ordered and revealed EF of 60-65% with mild increased wall thickness and moderately dilated left atrium.  Lexiscan Myoview revealed low risk and no evidence of ischemia.  She has thoracic aortic aneurysm that was stable on last chest CT.  She was last seen by Dr. Radford Pax on 09/12/2028 for follow-up and was reportedly doing well with chronic dyspnea on exertion and is related to her COPD.  She had stable lower extremity edema with diuretics.  She presented to the ED on 01/21/2022 for witnessed syncopal episode at home.  She had CT and x-rays without any traumatic injuries noted.  She was evaluated by Dr. Domenic Polite who arranged for Holter monitor upon discharge and orthostatics to be performed.  Event monitor completed and showed predominant sinus rhythm with multiple episodes of SVT consistent with atrial tach with longest being 1 hour and 56 minutes.  There was 1 episode of nonsustained VT lasting 5 beats and rare episodes of second-degree AV block type I with heart rate 27 bpm occurring during sleeping hours.  Rare PACs and rare PVCs.  Since last being seen in the office patient reports***.  Patient denies chest pain, palpitations, dyspnea, PND, orthopnea, nausea, vomiting, dizziness, syncope, edema, weight gain, or early satiety.     ***Notes:  Home Medications    Current Outpatient Medications  Medication Sig Dispense Refill   ACETAMINOPHEN-BUTALBITAL 50-325 MG TABS TAKE 1 TABLET BY MOUTH EVERY DAY AS NEEDED 60 tablet 3   albuterol (PROVENTIL) (2.5 MG/3ML) 0.083% nebulizer solution Take 3 mLs (2.5 mg total) by nebulization every 6 (six) hours as needed for wheezing or shortness of breath. 75 mL 12   allopurinol (ZYLOPRIM) 300  MG tablet Take 1 tablet by mouth once daily 90 tablet 0   amLODipine (NORVASC) 5 MG tablet Take 1 tablet (5 mg total) by mouth daily. 90 tablet 1   cetirizine (ZYRTEC ALLERGY) 10 MG tablet 10 mg by oral route.     cholecalciferol (VITAMIN D3) 25 MCG (1000 UNIT) tablet Take 2,000 Units by mouth daily.     clopidogrel (PLAVIX) 75 MG tablet Take 1 tablet by mouth once daily 90 tablet 0   cyclobenzaprine (FLEXERIL) 5 MG tablet Take 1 tablet by mouth three times daily as needed for muscle spasm 90 tablet 0   DULoxetine (CYMBALTA) 30 MG  capsule Take 30 mg by mouth daily.     DULoxetine (CYMBALTA) 60 MG capsule Take 1 capsule by mouth once daily 90 capsule 0   FARXIGA 10 MG TABS tablet Take 10 mg by mouth daily.     ferrous sulfate 325 (65 FE) MG tablet Take 325 mg by mouth daily with breakfast.     fluticasone (FLONASE) 50 MCG/ACT nasal spray Place 2 sprays into both nostrils daily. 11.1 mL 11   furosemide (LASIX) 20 MG tablet Take 20 mg by mouth 3 (three) times daily as needed for edema or fluid. 3 times a week as needed for swelling     metFORMIN (GLUCOPHAGE) 500 MG tablet Take 1 tablet (500 mg total) by mouth daily with breakfast. 90 tablet 0   metoprolol succinate (TOPROL-XL) 50 MG 24 hr tablet Take 50 mg by mouth daily.     montelukast (SINGULAIR) 10 MG tablet TAKE 1 TABLET BY MOUTH ONCE DAILY AT BEDTIME 30 tablet 0   pantoprazole (PROTONIX) 40 MG tablet TAKE 1 TABLET BY MOUTH TWICE DAILY BEFORE A MEAL 180 tablet 1   rosuvastatin (CRESTOR) 40 MG tablet Take 1 tablet by mouth once daily 90 tablet 3   Semaglutide (RYBELSUS) 7 MG TABS Take 7 mg by mouth daily. 30 tablet 5   telmisartan (MICARDIS) 80 MG tablet Take 1 tablet (80 mg total) by mouth daily.     No current facility-administered medications for this visit.     Review of Systems  Please see the history of present illness.    (+)*** (+)***  All other systems reviewed and are otherwise negative except as noted above.  Physical Exam     Wt Readings from Last 3 Encounters:  02/02/22 228 lb (103.4 kg)  01/21/22 220 lb (99.8 kg)  11/29/21 221 lb 6.4 oz (100.4 kg)   UU:VOZDG were no vitals filed for this visit.,There is no height or weight on file to calculate BMI.  Constitutional:      Appearance: Healthy appearance. Not in distress.  Neck:     Vascular: JVD normal.  Pulmonary:     Effort: Pulmonary effort is normal.     Breath sounds: No wheezing. No rales. Diminished in the bases Cardiovascular:     Normal rate. Regular rhythm. Normal S1. Normal S2.      Murmurs: There is no murmur.  Edema:    Peripheral edema absent.  Abdominal:     Palpations: Abdomen is soft non tender. There is no hepatomegaly.  Skin:    General: Skin is warm and dry.  Neurological:     General: No focal deficit present.     Mental Status: Alert and oriented to person, place and time.     Cranial Nerves: Cranial nerves are intact.  EKG/LABS/Other Studies Reviewed    ECG personally reviewed by me today - ***  Risk Assessment/Calculations:   {Does this patient have ATRIAL FIBRILLATION?:[phone]}        Lab Results  Component Value Date   WBC 9.6 01/21/2022   HGB 12.7 01/21/2022   HCT 39.7 01/21/2022   MCV 100.0 01/21/2022   PLT 246 01/21/2022   Lab Results  Component Value Date   CREATININE 1.66 (H) 01/21/2022   BUN 18 01/21/2022   NA 138 01/21/2022   K 4.1 01/21/2022   CL 106 01/21/2022   CO2 26 01/21/2022   Lab Results  Component Value Date   ALT 18 11/10/2021   AST 20 11/10/2021   ALKPHOS 106 11/10/2021  BILITOT 0.6 11/10/2021   Lab Results  Component Value Date   CHOL 139 11/10/2021   HDL 41.70 11/10/2021   LDLCALC 69 11/10/2021   LDLDIRECT 79.0 08/01/2020   TRIG 141.0 11/10/2021   CHOLHDL 3 11/10/2021    Lab Results  Component Value Date   HGBA1C 7.0 (H) 11/10/2021    Assessment & Plan    1.  Syncope and collapse: -Patient recently admitted to the ED with witnessed syncopal and collapse  while striking head. -She was evaluated by cardiology who placed a ZIO monitor that revealed multiple episodes of SVT and atrial tachycardia lasting 1 hour 56 minutes there was also 1 run of nonsustained VT lasting 5 beats. -  2.  Essential hypertension -Patient's blood pressure today was*** -Continue telmisartan 80 mg, Toprol XL 50 mg, and amlodipine 7.5 mg daily  3.  Chronic DOE: -History of chronic COPD and currently if followed by pulmonology  4.  Coronary artery disease: -s/p cardiac CTA in 2021 revealing mRCA 25-49% and ostial LM and then pLAD and LCs 0-24%.  Stress test in 2020 showed no ischemia      Disposition: Follow-up with Fransico Him, MD or APP in *** months {Are you ordering a CV Procedure (e.g. stress test, cath, DCCV, TEE, etc)?   Press F2        :[id number]}   Medication Adjustments/Labs and Tests Ordered: Current medicines are reviewed at length with the patient today.  Concerns regarding medicines are outlined above.   Signed, Mable Fill, Marissa Nestle, NP 02/21/2022, 6:43 PM  [facility]  Note:  This document was prepared using Dragon voice recognition software and may include unintentional dictation errors.

## 2022-02-22 ENCOUNTER — Encounter: Payer: Self-pay | Admitting: Nurse Practitioner

## 2022-02-22 ENCOUNTER — Other Ambulatory Visit: Payer: Self-pay | Admitting: Internal Medicine

## 2022-02-22 ENCOUNTER — Ambulatory Visit: Payer: Medicare Other | Attending: Nurse Practitioner | Admitting: Nurse Practitioner

## 2022-02-22 VITALS — BP 124/73 | HR 100 | Ht 65.0 in | Wt 227.2 lb

## 2022-02-22 DIAGNOSIS — R55 Syncope and collapse: Secondary | ICD-10-CM | POA: Diagnosis not present

## 2022-02-22 DIAGNOSIS — R002 Palpitations: Secondary | ICD-10-CM

## 2022-02-22 DIAGNOSIS — I2510 Atherosclerotic heart disease of native coronary artery without angina pectoris: Secondary | ICD-10-CM

## 2022-02-22 DIAGNOSIS — R0609 Other forms of dyspnea: Secondary | ICD-10-CM | POA: Diagnosis not present

## 2022-02-22 DIAGNOSIS — I10 Essential (primary) hypertension: Secondary | ICD-10-CM | POA: Diagnosis not present

## 2022-02-22 MED ORDER — METOPROLOL TARTRATE 25 MG PO TABS: 25.0000 mg | ORAL_TABLET | Freq: Two times a day (BID) | ORAL | 3 refills | Status: DC

## 2022-02-22 NOTE — Patient Instructions (Signed)
Medication Instructions:   START TAKING: METOPROLOL  25 MG TWICE A DAY   YOU MAY TAKE AN EXTRA TABLET IF AS NEEDED FOR PALPITATIONS  *If you need a refill on your cardiac medications before your next appointment, please call your pharmacy*   Lab Work: NONE ORDERED  TODAY   If you have labs (blood work) drawn today and your tests are completely normal, you will receive your results only by: [place] (if you have MyChart) OR A paper copy in the mail If you have any lab test that is abnormal or we need to change your treatment, we will call you to review the results.   Testing/Procedures: Your physician has requested that you have a lexiscan myoview. For further information please visit [email or web address]. Please follow instruction sheet, as given.   Follow-Up: At [facility], you and your health needs are our priority.  As part of our continuing mission to provide you with exceptional heart care, we have created designated Provider Care Teams.  These Care Teams include your primary Cardiologist (physician) and Advanced Practice Providers (APPs -  Physician Assistants and Nurse Practitioners) who all work together to provide you with the care you need, when you need it.  We recommend signing up for the patient portal called "MyChart".  Sign up information is provided on this After Visit Summary.  MyChart is used to connect with patients for Virtual Visits (Telemedicine).  Patients are able to view lab/test results, encounter notes, upcoming appointments, etc.  Non-urgent messages can be sent to your provider as well.   To learn more about what you can do with MyChart, go to [email or web address].    Your next appointment:   1-2 month(s)  The format for your next appointment:   In Person  Provider:   Ambrose Pancoast, NP    Other Instructions   Important Information About Sugar

## 2022-03-02 ENCOUNTER — Telehealth (HOSPITAL_COMMUNITY): Payer: Self-pay | Admitting: *Deleted

## 2022-03-02 NOTE — Telephone Encounter (Signed)
Reached pt and gave instructions for MPI study on 03/08/22.

## 2022-03-04 ENCOUNTER — Encounter: Payer: Self-pay | Admitting: Internal Medicine

## 2022-03-07 ENCOUNTER — Encounter: Payer: Self-pay | Admitting: Internal Medicine

## 2022-03-08 ENCOUNTER — Ambulatory Visit (HOSPITAL_COMMUNITY): Payer: Medicare Other | Attending: Nurse Practitioner

## 2022-03-08 DIAGNOSIS — R55 Syncope and collapse: Secondary | ICD-10-CM | POA: Diagnosis present

## 2022-03-08 MED ORDER — REGADENOSON 0.4 MG/5ML IV SOLN
0.4000 mg | Freq: Once | INTRAVENOUS | Status: AC
  Administered 2022-03-08: 0.4 mg via INTRAVENOUS

## 2022-03-08 MED ORDER — TECHNETIUM TC 99M TETROFOSMIN IV KIT
32.0000 | PACK | Freq: Once | INTRAVENOUS | Status: AC | PRN
  Administered 2022-03-08: 32 via INTRAVENOUS

## 2022-03-09 ENCOUNTER — Ambulatory Visit (HOSPITAL_COMMUNITY): Payer: Medicare Other | Attending: Nurse Practitioner

## 2022-03-09 LAB — MYOCARDIAL PERFUSION IMAGING
LV dias vol: 40 mL (ref 46–106)
LV sys vol: 12 mL
Nuc Stress EF: 70 %
Peak HR: 107 {beats}/min
Rest HR: 93 {beats}/min
Rest Nuclear Isotope Dose: 32.1 mCi
SDS: 1
SRS: 0
SSS: 1
ST Depression (mm): 0 mm
Stress Nuclear Isotope Dose: 32 mCi
TID: 0.83

## 2022-03-09 MED ORDER — TECHNETIUM TC 99M TETROFOSMIN IV KIT
32.1000 | PACK | Freq: Once | INTRAVENOUS | Status: AC | PRN
  Administered 2022-03-09: 32.1 via INTRAVENOUS

## 2022-03-10 ENCOUNTER — Other Ambulatory Visit: Payer: Self-pay | Admitting: Internal Medicine

## 2022-03-12 ENCOUNTER — Other Ambulatory Visit: Payer: Self-pay

## 2022-03-19 NOTE — Progress Notes (Signed)
Office Visit    Patient Name: Kerry Powell Date of Encounter: 03/19/2022  Primary Care Provider:  Binnie Rail, MD Primary Cardiologist:  Fransico Him, MD Primary Electrophysiologist: None  Chief Complaint    Kerry Powell is a 77 y.o. female with PMH of thoracic aortic aneurysm, CVA, PVD, HLD, DM type II, CKD, COPD, bipolar disorder who presents today for posthospital follow-up for loss of consciousness.   Past Medical History    Past Medical History:  Diagnosis Date   Allergy    Anemia    Anxiety    Arthritis    BACK PAIN, CHRONIC 04/04/2007   Qualifier: Diagnosis of  By: Carlean Purl MD, FACG, [place] AFFECTIVE DISORDER 04/04/2007   Qualifier: History of  By: Carlean Purl MD, Tonna Boehringer E    CAD (coronary artery disease), native coronary artery    mild CAD with no obstructive disease by coronary CTA 06/2019   Cataract    removed both eyes   Chronic neck pain 04/30/2015   Cervical spondylosis [facility] Intermittent numbness and tingling in occipital region and headaches S/p 2 occipital nerve blocks - only temp relief no relief with gabapentin, ultram, brace MRI neck - facet arthrosis    CKD (chronic kidney disease) 04/26/2015   COPD (chronic obstructive pulmonary disease) ([place])    pt denies - told this while was smoking -   Cough 04/26/2015   Depression 07/01/2017   Diabetes ([place]) 04/27/2015   Diagnosed 03/2015    Dizziness 06/10/2017   Dysphagia 04/26/2015   DYSPNEA 12/24/2007   Qualifier: Diagnosis of  By: Glennis Brink     Elevated TSH 08/09/2015   GASTRIC ULCER, ACUTE 04/07/2007   Annotation: EGD 04/07/07 Qualifier: Diagnosis of  By: Carlean Purl MD, Tonna Boehringer E    GASTRITIS 04/07/2007   Annotation: EGD 04/07/07 Qualifier: Diagnosis of  By: Carlean Purl MD, Tonna Boehringer E    GERD (gastroesophageal reflux disease)    H/O renal cell carcinoma 05/23/2017   Dx 2010, s/p cryoablation Follows with urology - Dr Lovena Neighbours   HIATAL HERNIA 04/07/2007   Annotation: EGD 04/07/07  Qualifier: Diagnosis of  By: Carlean Purl MD, Tonna Boehringer E    History of CVA (cerebrovascular accident) 04/26/2015   History of gout 04/26/2015   Hyperlipidemia    Hypertension    Irritable bowel syndrome 04/04/2007   Qualifier: Diagnosis of  By: Carlean Purl MD, Tonna Boehringer E    Kidney tumor (benign)    Nausea and vomiting 09/09/2017   Occipital neuralgia 05/09/2016   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 Dr Dimas Millin in [place] 04/04/2007   Qualifier: Diagnosis of  By: Carlean Purl MD, Dimas Millin    Personal history of colonic adenoma 06/07/2012   POSTNASAL DRIP SYNDROME 12/29/2007   Qualifier: Diagnosis of  By: Chase Caller MD, [name]     RESTLESS LEG SYNDROME 04/04/2007   Qualifier: Diagnosis of  By: Carlean Purl MD, FACG, [place], 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   Qualifier: Diagnosis of  By: Carlean Purl MD, Dimas Millin    Sleep difficulties 09/09/2017   Snoring 04/26/2015   Stroke ([place])    x2 2011   Thoracic aortic aneurysm ([place]) 02/06/2018   Seen on MRI 01/25/18 of thoracic spine ordered by [facility] -- referred to CTS to monitor  -- 3.5 cm   TIA (transient ischemic attack)    TOBACCO ABUSE 12/17/2007   Qualifier: Diagnosis of  By: Chase Caller MD, Belva Crome  Trigeminal neuralgia of right side of face 02/08/2016   URI (upper respiratory infection) 02/08/2016   Vitamin D deficiency 03/12/2018   Past Surgical History:  Procedure Laterality Date   ABDOMINAL HYSTERECTOMY     APPENDECTOMY     CATARACT EXTRACTION     COLONOSCOPY     CYSTOCELE REPAIR     ESOPHAGOGASTRODUODENOSCOPY     KIDNEY SURGERY     benign tumor removal   ORTHOPEDIC SURGERY     Wrist & elbow   POLYPECTOMY     RECTOCELE REPAIR     TONSILECTOMY, ADENOIDECTOMY, BILATERAL MYRINGOTOMY AND TUBES Bilateral 1968   UPPER GASTROINTESTINAL ENDOSCOPY      Allergies  Allergies  Allergen Reactions   Gabapentin Other (See Comments)    Fluid retention    Clindamycin Rash   Sulfa Antibiotics Nausea And Vomiting    History of  Present Illness    ULANI DEGRASSE  is a 77 year old female with the above mention past medical history who presents today for posthospital follow-up of loss of consciousness.  Kerry Powell was initially seen by Dr. Radford Pax in 2020 for complaint of shortness of breath.  2D echo was ordered and revealed EF of 60-65% with mild increased wall thickness and moderately dilated left atrium.  Lexiscan Myoview revealed low risk and no evidence of ischemia.  She has thoracic aortic aneurysm that was stable on last chest CT.  She was last seen by Dr. Radford Pax on 09/12/2028 for follow-up and was reportedly doing well with chronic dyspnea on exertion and is related to her COPD.  She had stable lower extremity edema with diuretics.  She presented to the ED on 01/21/2022 for witnessed syncopal episode at home.  She had CT and x-rays without any traumatic injuries noted.  She was evaluated by Dr. Domenic Polite who arranged for Holter monitor upon discharge and orthostatics to be performed.  Event monitor completed and showed predominant sinus rhythm with multiple episodes of SVT consistent with atrial tach with longest being 1 hour and 56 minutes.  There was 1 episode of nonsustained VT lasting 5 beats and rare episodes of second-degree AV block type I with heart rate 27 bpm occurring during sleeping hours.  Rare PACs and rare PVCs.  She was seen in follow-up 02/22/2022.  She reported feeling much better since previous syncopal episode.  Blood pressure was well-controlled and heart rate was elevated at 100 bpm.  She underwent Lexiscan Myoview to evaluate for ischemia related to back tightness that was normal with no evidence of ischemia and low risk.  Kerry Powell presents today for 7-monthfollow-up of palpitations alone.  Since last being seen in the office patient reports she has been doing well with no new cardiac complaints.  She reports that her palpitations occur very infrequently and lasts for a few beats.  She denies any presyncope  or syncopal episodes since previous visit.  Her blood pressure today is well-controlled at 110/68 with heart rate of 79 bpm.  She is compliant with her current medication regimen and is not experiencing any adverse reactions.  During her visit we reviewed the results of her nuclear stress test and patient had all questions answered to her satisfaction.  She is currently still experiencing mid back pain that seems to be related more to possible osteoarthritis.  [facility]  Patient denies chest pain, palpitations, dyspnea, PND, orthopnea, nausea, vomiting, dizziness, syncope, edema, weight gain, or  early satiety.   Home Medications    Current Outpatient Medications  Medication Sig Dispense Refill   ACETAMINOPHEN-BUTALBITAL 50-325 MG TABS TAKE 1 TABLET BY MOUTH EVERY DAY AS NEEDED 60 tablet 3   albuterol (PROVENTIL) (2.5 MG/3ML) 0.083% nebulizer solution Take 3 mLs (2.5 mg total) by nebulization every 6 (six) hours as needed for wheezing or shortness of breath. 75 mL 12   allopurinol (ZYLOPRIM) 300 MG tablet Take 1 tablet by mouth once daily 90 tablet 0   amLODipine (NORVASC) 5 MG tablet Take 1 tablet (5 mg total) by mouth daily. 90 tablet 1   cetirizine (ZYRTEC ALLERGY) 10 MG tablet 10 mg by oral route.     cholecalciferol (VITAMIN D3) 25 MCG (1000 UNIT) tablet Take 2,000 Units by mouth daily.     clopidogrel (PLAVIX) 75 MG tablet Take 1 tablet by mouth once daily 90 tablet 0   cyclobenzaprine (FLEXERIL) 5 MG tablet Take 1 tablet by mouth three times daily as needed for muscle spasm 90 tablet 0   DULoxetine (CYMBALTA) 60 MG capsule Take 1 capsule by mouth once daily 90 capsule 0   FARXIGA 10 MG TABS tablet Take 10 mg by mouth daily.     ferrous sulfate 325 (65 FE) MG tablet Take 325 mg by mouth as needed.     fluticasone (FLONASE) 50 MCG/ACT nasal spray Place 2 sprays into both nostrils daily. (Patient taking differently: Place 2 sprays into  both nostrils as needed.) 11.1 mL 11   furosemide (LASIX) 20 MG tablet Take 20 mg by mouth 3 (three) times daily as needed for edema or fluid. 3 times a week as needed for swelling     metoprolol tartrate (LOPRESSOR) 25 MG tablet Take 1 tablet (25 mg total) by mouth 2 (two) times daily. May take extra 25 mg tablet for as needed palpitations 180 tablet 3   montelukast (SINGULAIR) 10 MG tablet TAKE 1 TABLET BY MOUTH ONCE DAILY AT BEDTIME 30 tablet 0   pantoprazole (PROTONIX) 40 MG tablet TAKE 1 TABLET BY MOUTH TWICE DAILY BEFORE A MEAL 180 tablet 1   rosuvastatin (CRESTOR) 40 MG tablet Take 1 tablet by mouth once daily 90 tablet 3   Semaglutide (RYBELSUS) 7 MG TABS Take 7 mg by mouth daily. 30 tablet 5   telmisartan (MICARDIS) 80 MG tablet Take 1 tablet (80 mg total) by mouth daily.     No current facility-administered medications for this visit.     Review of Systems  Please see the history of present illness.    (+) Mid back pain (+) Shortness of breath with heavy exertion  All other systems reviewed and are otherwise negative except as noted above.  Physical Exam    Wt Readings from Last 3 Encounters:  03/08/22 227 lb (103 kg)  02/22/22 227 lb 3.2 oz (103.1 kg)  02/02/22 228 lb (103.4 kg)   ZO:XWRUE were no vitals filed for this visit.,There is no height or weight on file to calculate BMI.  Constitutional:      Appearance: Healthy appearance. Not in distress.  Neck:     Vascular: JVD normal.  Pulmonary:     Effort: Pulmonary effort is normal.     Breath sounds: No wheezing. No rales. Diminished in the bases Cardiovascular:     Normal rate. Regular rhythm. Normal S1. Normal S2.      Murmurs: There is no murmur.  Edema:    Peripheral edema absent.  Abdominal:     Palpations: Abdomen  is soft non tender. There is no hepatomegaly.  Skin:    General: Skin is warm and dry.  Neurological:     General: No focal deficit present.     Mental Status: Alert and oriented to person,  place and time.     Cranial Nerves: Cranial nerves are intact.  EKG/LABS/Other Studies Reviewed    ECG personally reviewed by me today -none completed today   Lab Results  Component Value Date   WBC 9.6 01/21/2022   HGB 12.7 01/21/2022   HCT 39.7 01/21/2022   MCV 100.0 01/21/2022   PLT 246 01/21/2022   Lab Results  Component Value Date   CREATININE 1.66 (H) 01/21/2022   BUN 18 01/21/2022   NA 138 01/21/2022   K 4.1 01/21/2022   CL 106 01/21/2022   CO2 26 01/21/2022   Lab Results  Component Value Date   ALT 18 11/10/2021   AST 20 11/10/2021   ALKPHOS 106 11/10/2021   BILITOT 0.6 11/10/2021   Lab Results  Component Value Date   CHOL 139 11/10/2021   HDL 41.70 11/10/2021   LDLCALC 69 11/10/2021   LDLDIRECT 79.0 08/01/2020   TRIG 141.0 11/10/2021   CHOLHDL 3 11/10/2021    Lab Results  Component Value Date   HGBA1C 7.0 (H) 11/10/2021    Assessment & Plan    1.  Syncope and collapse/palpitations: -Patient presents today for follow-up of syncope and reports no recurrence or presyncopal episodes. -She is currently tolerating metoprolol 25 mg twice daily. -She was advised to contact our office if she notices any syncopal or presyncopal events.   2.  Essential hypertension -Patient's blood pressure today was well-controlled at 110/68 -Continue telmisartan 80 mg, amlodipine 7.5 mg daily and metoprolol 25 mg twice daily.   3.  Chronic DOE:  -History of chronic COPD and currently if followed by pulmonology   4.  Coronary artery disease: -s/p cardiac CTA in 2021 revealing mRCA 25-49% and ostial LM and then pLAD and LCs 0-24%.  Stress test in 2020 showed no ischemia -Patient was sent for nuclear stress test that revealed no evidence of ischemia with good LV function. -Patient continues to have ongoing mid back pain and will be referred back to [facility] for treatment.  5.  DM type II: -Continue current treatment plan and medications per PCP   Disposition:  Follow-up with Fransico Him, MD or APP as scheduled     Medication Adjustments/Labs and Tests Ordered: Current medicines are reviewed at length with the patient today.  Concerns regarding medicines are outlined above.   Signed, Mable Fill, Marissa Nestle, NP 03/19/2022, 7:09 PM [facility]  Note:  This document was prepared using Dragon voice recognition software and may include unintentional dictation errors.

## 2022-03-20 ENCOUNTER — Ambulatory Visit: Payer: Medicare Other | Attending: Nurse Practitioner | Admitting: Nurse Practitioner

## 2022-03-20 ENCOUNTER — Encounter: Payer: Self-pay | Admitting: Nurse Practitioner

## 2022-03-20 VITALS — BP 110/68 | HR 79 | Ht 65.0 in | Wt 228.0 lb

## 2022-03-20 DIAGNOSIS — R0609 Other forms of dyspnea: Secondary | ICD-10-CM

## 2022-03-20 DIAGNOSIS — E1142 Type 2 diabetes mellitus with diabetic polyneuropathy: Secondary | ICD-10-CM

## 2022-03-20 DIAGNOSIS — I2510 Atherosclerotic heart disease of native coronary artery without angina pectoris: Secondary | ICD-10-CM

## 2022-03-20 DIAGNOSIS — R55 Syncope and collapse: Secondary | ICD-10-CM | POA: Diagnosis not present

## 2022-03-20 DIAGNOSIS — I10 Essential (primary) hypertension: Secondary | ICD-10-CM

## 2022-03-20 DIAGNOSIS — J441 Chronic obstructive pulmonary disease with (acute) exacerbation: Secondary | ICD-10-CM

## 2022-03-20 DIAGNOSIS — E7800 Pure hypercholesterolemia, unspecified: Secondary | ICD-10-CM

## 2022-03-20 NOTE — Patient Instructions (Signed)
Medication Instructions:  Your physician recommends that you continue on your current medications as directed. Please refer to the Current Medication list given to you today. *If you need a refill on your cardiac medications before your next appointment, please call your pharmacy*  Lab Work: None Ordered  Testing/Procedures: None Ordered   Follow-Up: At [facility], you and your health needs are our priority.  As part of our continuing mission to provide you with exceptional heart care, we have created designated Provider Care Teams.  These Care Teams include your primary Cardiologist (physician) and Advanced Practice Providers (APPs -  Physician Assistants and Nurse Practitioners) who all work together to provide you with the care you need, when you need it.  We recommend signing up for the patient portal called "MyChart".  Sign up information is provided on this After Visit Summary.  MyChart is used to connect with patients for Virtual Visits (Telemedicine).  Patients are able to view lab/test results, encounter notes, upcoming appointments, etc.  Non-urgent messages can be sent to your provider as well.   To learn more about what you can do with MyChart, go to [email or web address].    Your next appointment:   6 month(s)  Provider:   Fransico Him, MD     Other Instructions You have been referred to [facility] with Dr Tonita Cong

## 2022-04-09 ENCOUNTER — Ambulatory Visit (INDEPENDENT_AMBULATORY_CARE_PROVIDER_SITE_OTHER): Payer: Medicare Other | Admitting: *Deleted

## 2022-04-09 DIAGNOSIS — E538 Deficiency of other specified B group vitamins: Secondary | ICD-10-CM | POA: Diagnosis not present

## 2022-04-09 MED ORDER — CYANOCOBALAMIN 1000 MCG/ML IJ SOLN
1000.0000 ug | Freq: Once | INTRAMUSCULAR | Status: AC
  Administered 2022-04-09: 1000 ug via INTRAMUSCULAR

## 2022-04-09 NOTE — Progress Notes (Signed)
Pls cosign for B12 inj../lmb  

## 2022-04-12 ENCOUNTER — Other Ambulatory Visit: Payer: Self-pay | Admitting: Internal Medicine

## 2022-04-27 ENCOUNTER — Encounter: Payer: Self-pay | Admitting: Internal Medicine

## 2022-04-27 DIAGNOSIS — I10 Essential (primary) hypertension: Secondary | ICD-10-CM

## 2022-04-27 DIAGNOSIS — M1A30X0 Chronic gout due to renal impairment, unspecified site, without tophus (tophi): Secondary | ICD-10-CM

## 2022-04-27 DIAGNOSIS — E559 Vitamin D deficiency, unspecified: Secondary | ICD-10-CM

## 2022-04-27 DIAGNOSIS — E1122 Type 2 diabetes mellitus with diabetic chronic kidney disease: Secondary | ICD-10-CM

## 2022-04-27 DIAGNOSIS — E538 Deficiency of other specified B group vitamins: Secondary | ICD-10-CM

## 2022-04-27 DIAGNOSIS — N1832 Chronic kidney disease, stage 3b: Secondary | ICD-10-CM

## 2022-04-27 DIAGNOSIS — E782 Mixed hyperlipidemia: Secondary | ICD-10-CM

## 2022-04-30 ENCOUNTER — Telehealth: Payer: Self-pay

## 2022-05-01 NOTE — Telephone Encounter (Signed)
Noted  

## 2022-05-07 ENCOUNTER — Other Ambulatory Visit (HOSPITAL_COMMUNITY): Payer: Self-pay

## 2022-05-08 ENCOUNTER — Other Ambulatory Visit (HOSPITAL_COMMUNITY): Payer: Self-pay

## 2022-05-08 ENCOUNTER — Telehealth: Payer: Self-pay

## 2022-05-08 NOTE — Telephone Encounter (Signed)
PAP application [place] for FARIXGA  has been mailed to pt home. I will fax PCP pages once I receive pt pages.   I spoke to pt and she is aware that I am trying to help her get assistant for medication.   [facility] Patient Advocate [phone]

## 2022-05-11 ENCOUNTER — Ambulatory Visit: Payer: Medicare Other | Admitting: Internal Medicine

## 2022-05-12 ENCOUNTER — Encounter: Payer: Self-pay | Admitting: Surgery

## 2022-05-12 ENCOUNTER — Other Ambulatory Visit: Payer: Self-pay | Admitting: Internal Medicine

## 2022-05-14 ENCOUNTER — Encounter: Payer: Self-pay | Admitting: Internal Medicine

## 2022-05-14 NOTE — Progress Notes (Unsigned)
Subjective:    Patient ID: Kerry Powell, female    DOB: Apr 25, 1945, 77 y.o.   MRN: [id number]     HPI Akila is here for follow up of her chronic medical problems.  Dexa scan, lung ca screening  Medications and allergies reviewed with patient and updated if appropriate.  Current Outpatient Medications on File Prior to Visit  Medication Sig Dispense Refill   ACETAMINOPHEN-BUTALBITAL 50-325 MG TABS TAKE 1 TABLET BY MOUTH EVERY DAY AS NEEDED 60 tablet 3   albuterol (PROVENTIL) (2.5 MG/3ML) 0.083% nebulizer solution Take 3 mLs (2.5 mg total) by nebulization every 6 (six) hours as needed for wheezing or shortness of breath. 75 mL 12   allopurinol (ZYLOPRIM) 300 MG tablet Take 1 tablet by mouth once daily 90 tablet 0   amLODipine (NORVASC) 5 MG tablet Take 1 tablet (5 mg total) by mouth daily. 90 tablet 1   cetirizine (ZYRTEC ALLERGY) 10 MG tablet 10 mg by oral route.     cholecalciferol (VITAMIN D3) 25 MCG (1000 UNIT) tablet Take 2,000 Units by mouth daily.     clopidogrel (PLAVIX) 75 MG tablet Take 1 tablet by mouth once daily 90 tablet 0   cyclobenzaprine (FLEXERIL) 5 MG tablet Take 1 tablet by mouth three times daily as needed for muscle spasm 90 tablet 0   DULoxetine (CYMBALTA) 60 MG capsule Take 1 capsule by mouth once daily 90 capsule 0   FARXIGA 10 MG TABS tablet Take 10 mg by mouth daily.     ferrous sulfate 325 (65 FE) MG tablet Take 325 mg by mouth as needed.     fluticasone (FLONASE) 50 MCG/ACT nasal spray Place 2 sprays into both nostrils daily. (Patient taking differently: Place 2 sprays into both nostrils as needed.) 11.1 mL 11   furosemide (LASIX) 20 MG tablet Take 20 mg by mouth 3 (three) times daily as needed for edema or fluid. 3 times a week as needed for swelling     metoprolol tartrate (LOPRESSOR) 25 MG tablet Take 1 tablet (25 mg total) by mouth 2 (two) times daily. May take extra 25 mg tablet for as needed palpitations 180 tablet 3   montelukast (SINGULAIR) 10  MG tablet TAKE 1 TABLET BY MOUTH ONCE DAILY AT BEDTIME 30 tablet 0   pantoprazole (PROTONIX) 40 MG tablet TAKE 1 TABLET BY MOUTH TWICE DAILY BEFORE A MEAL 180 tablet 1   rosuvastatin (CRESTOR) 40 MG tablet Take 1 tablet by mouth once daily 90 tablet 3   RYBELSUS 7 MG TABS Take 1 tablet by mouth once daily 30 tablet 0   telmisartan (MICARDIS) 80 MG tablet Take 1 tablet (80 mg total) by mouth daily.     No current facility-administered medications on file prior to visit.     Review of Systems     Objective:  There were no vitals filed for this visit. BP Readings from Last 3 Encounters:  03/20/22 110/68  02/22/22 124/73  02/02/22 126/88   Wt Readings from Last 3 Encounters:  03/20/22 228 lb (103.4 kg)  03/08/22 227 lb (103 kg)  02/22/22 227 lb 3.2 oz (103.1 kg)   There is no height or weight on file to calculate BMI.    Physical Exam     Lab Results  Component Value Date   WBC 9.6 01/21/2022   HGB 12.7 01/21/2022   HCT 39.7 01/21/2022   PLT 246 01/21/2022   GLUCOSE 145 (H) 01/21/2022   CHOL 139 11/10/2021  TRIG 141.0 11/10/2021   HDL 41.70 11/10/2021   LDLDIRECT 79.0 08/01/2020   LDLCALC 69 11/10/2021   ALT 18 11/10/2021   AST 20 11/10/2021   NA 138 01/21/2022   K 4.1 01/21/2022   CL 106 01/21/2022   CREATININE 1.66 (H) 01/21/2022   BUN 18 01/21/2022   CO2 26 01/21/2022   TSH 3.48 08/01/2020   INR 0.91 06/30/2009   HGBA1C 7.0 (H) 11/10/2021     Assessment & Plan:    See Problem List for Assessment and Plan of chronic medical problems.

## 2022-05-14 NOTE — Patient Instructions (Addendum)
      Blood work was ordered.   The lab is on the first floor.    Medications changes include :   increase Rybelsus to 14 mg daily   A referral was ordered for vascular surgery.     Return in about 6 months (around 11/15/2022) for Physical Exam.

## 2022-05-15 ENCOUNTER — Encounter: Payer: Self-pay | Admitting: Internal Medicine

## 2022-05-15 ENCOUNTER — Ambulatory Visit: Payer: Medicare Other | Admitting: Internal Medicine

## 2022-05-15 ENCOUNTER — Telehealth: Payer: Self-pay

## 2022-05-15 VITALS — BP 128/80 | HR 76 | Temp 97.6°F | Ht 65.0 in | Wt 234.0 lb

## 2022-05-15 DIAGNOSIS — E1122 Type 2 diabetes mellitus with diabetic chronic kidney disease: Secondary | ICD-10-CM | POA: Diagnosis not present

## 2022-05-15 DIAGNOSIS — I739 Peripheral vascular disease, unspecified: Secondary | ICD-10-CM

## 2022-05-15 DIAGNOSIS — E782 Mixed hyperlipidemia: Secondary | ICD-10-CM

## 2022-05-15 DIAGNOSIS — E538 Deficiency of other specified B group vitamins: Secondary | ICD-10-CM | POA: Diagnosis not present

## 2022-05-15 DIAGNOSIS — E559 Vitamin D deficiency, unspecified: Secondary | ICD-10-CM

## 2022-05-15 DIAGNOSIS — F419 Anxiety disorder, unspecified: Secondary | ICD-10-CM | POA: Diagnosis not present

## 2022-05-15 DIAGNOSIS — N1831 Chronic kidney disease, stage 3a: Secondary | ICD-10-CM

## 2022-05-15 DIAGNOSIS — M1A30X0 Chronic gout due to renal impairment, unspecified site, without tophus (tophi): Secondary | ICD-10-CM

## 2022-05-15 DIAGNOSIS — N1832 Chronic kidney disease, stage 3b: Secondary | ICD-10-CM

## 2022-05-15 DIAGNOSIS — I7121 Aneurysm of the ascending aorta, without rupture: Secondary | ICD-10-CM

## 2022-05-15 DIAGNOSIS — I10 Essential (primary) hypertension: Secondary | ICD-10-CM

## 2022-05-15 DIAGNOSIS — R600 Localized edema: Secondary | ICD-10-CM

## 2022-05-15 DIAGNOSIS — R300 Dysuria: Secondary | ICD-10-CM

## 2022-05-15 DIAGNOSIS — F3289 Other specified depressive episodes: Secondary | ICD-10-CM

## 2022-05-15 DIAGNOSIS — G2581 Restless legs syndrome: Secondary | ICD-10-CM

## 2022-05-15 LAB — COMPREHENSIVE METABOLIC PANEL
ALT: 20 U/L (ref 0–35)
AST: 17 U/L (ref 0–37)
Albumin: 4.2 g/dL (ref 3.5–5.2)
Alkaline Phosphatase: 92 U/L (ref 39–117)
BUN: 29 mg/dL — ABNORMAL HIGH (ref 6–23)
CO2: 28 mEq/L (ref 19–32)
Calcium: 9.7 mg/dL (ref 8.4–10.5)
Chloride: 100 mEq/L (ref 96–112)
Creatinine, Ser: 1.64 mg/dL — ABNORMAL HIGH (ref 0.40–1.20)
GFR: 30.09 mL/min — ABNORMAL LOW (ref >60.00)
Glucose, Bld: 73 mg/dL (ref 70–99)
Potassium: 4.6 mEq/L (ref 3.5–5.1)
Sodium: 137 mEq/L (ref 135–145)
Total Bilirubin: 0.6 mg/dL (ref 0.2–1.2)
Total Protein: 7.3 g/dL (ref 6.0–8.3)

## 2022-05-15 LAB — CBC WITH DIFFERENTIAL/PLATELET
Basophils Absolute: 0.1 10*3/uL (ref 0.0–0.1)
Basophils Relative: 0.7 % (ref 0.0–3.0)
Eosinophils Absolute: 0.2 10*3/uL (ref 0.0–0.7)
Eosinophils Relative: 2.2 % (ref 0.0–5.0)
HCT: 45.6 % (ref 36.0–46.0)
Hemoglobin: 14.9 g/dL (ref 12.0–15.0)
Lymphocytes Relative: 23.7 % (ref 12.0–46.0)
Lymphs Abs: 2.3 10*3/uL (ref 0.7–4.0)
MCHC: 32.7 g/dL (ref 30.0–36.0)
MCV: 98.4 fl (ref 78.0–100.0)
Monocytes Absolute: 0.8 10*3/uL (ref 0.1–1.0)
Monocytes Relative: 8.4 % (ref 3.0–12.0)
Neutro Abs: 6.2 10*3/uL (ref 1.4–7.7)
Neutrophils Relative %: 65 % (ref 43.0–77.0)
Platelets: 299 10*3/uL (ref 150.0–400.0)
RBC: 4.64 Mil/uL (ref 3.87–5.11)
RDW: 14.4 % (ref 11.5–15.5)
WBC: 9.6 10*3/uL (ref 4.0–10.5)

## 2022-05-15 LAB — LIPID PANEL
Cholesterol: 136 mg/dL (ref 0–200)
HDL: 52.6 mg/dL (ref >39.00)
LDL Cholesterol: 62 mg/dL (ref 0–99)
NonHDL: 83.28
Total CHOL/HDL Ratio: 3
Triglycerides: 105 mg/dL (ref 0.0–149.0)
VLDL: 21 mg/dL (ref 0.0–40.0)

## 2022-05-15 LAB — URIC ACID: Uric Acid, Serum: 4.3 mg/dL (ref 2.4–7.0)

## 2022-05-15 LAB — VITAMIN D 25 HYDROXY (VIT D DEFICIENCY, FRACTURES): VITD: 37.56 ng/mL (ref 30.00–100.00)

## 2022-05-15 LAB — HEMOGLOBIN A1C: Hgb A1c MFr Bld: 7.1 % — ABNORMAL HIGH (ref 4.6–6.5)

## 2022-05-15 LAB — MICROALBUMIN / CREATININE URINE RATIO
Creatinine,U: 57.7 mg/dL
Microalb Creat Ratio: 1.2 mg/g (ref 0.0–30.0)
Microalb, Ur: <0.7 mg/dL (ref 0.0–1.9)

## 2022-05-15 MED ORDER — CYANOCOBALAMIN 1000 MCG/ML IJ SOLN
1000.0000 ug | Freq: Once | INTRAMUSCULAR | Status: AC
  Administered 2022-05-15: 1000 ug via INTRAMUSCULAR

## 2022-05-15 MED ORDER — FUROSEMIDE 20 MG PO TABS: 20.0000 mg | ORAL_TABLET | Freq: Every day | ORAL | 2 refills | Status: DC

## 2022-05-15 MED ORDER — RYBELSUS 14 MG PO TABS: 14.0000 mg | ORAL_TABLET | Freq: Every day | ORAL | 1 refills | Status: DC

## 2022-05-15 NOTE — Assessment & Plan Note (Signed)
Chronic Well controlled here today Continue current medications - amlodipine 5 mg daily, metoprolol 25 mg twice daily, telmisartan 80 mg daily CMP

## 2022-05-15 NOTE — Assessment & Plan Note (Addendum)
Chronic   Lab Results  Component Value Date   HGBA1C 7.0 (H) 11/10/2021   Sugars somewhat controlled Check A1c, urine microalbumin today Increase Rybelsus to 14 mg daily, continue to [place] 10 mg daily Stressed regular exercise, diabetic diet Working on weight loss

## 2022-05-15 NOTE — Assessment & Plan Note (Addendum)
Chronic Has increased stress recently-advised her not to think of all of the what if's with her husband's medical problems-she needs to take it 1 day at a time Encouraged her to work on trying to stop some of the stress eating Continue duloxetine 90 mg daily

## 2022-05-15 NOTE — Assessment & Plan Note (Signed)
Chronic Check uric acid level Controlled, stable - no episodes of gout Continue allopurinol 300 mg daily

## 2022-05-15 NOTE — Assessment & Plan Note (Addendum)
Chronic Trial of Flexeril at night to help her sleep and muscle pain Continue Flexeril

## 2022-05-15 NOTE — Assessment & Plan Note (Signed)
History of PAD although most recent imaging did not show any Interested in switching vascular surgeon-referral ordered

## 2022-05-15 NOTE — Assessment & Plan Note (Signed)
Chronic CBC, CMP, vitamin D level On Farxiga 10 mg daily Following with Dr. Royce Macadamia

## 2022-05-15 NOTE — Assessment & Plan Note (Signed)
Chronic ?Controlled, Stable ?Continue duloxetine 90 mg daily ?

## 2022-05-15 NOTE — Assessment & Plan Note (Signed)
Chronic Continue vitamin d supplementation Check vitamin D level

## 2022-05-15 NOTE — Assessment & Plan Note (Signed)
Chronic Check lipid panel  Continue crestor 40 mg daily Regular exercise and healthy diet encouraged  

## 2022-05-15 NOTE — Assessment & Plan Note (Addendum)
Chronic Increased left lower extremity edema-likely secondary to knee osteoarthritis which is severe Lasix 20 mg daily Elevate legs-would like to avoid extra Lasix CKD

## 2022-05-15 NOTE — Assessment & Plan Note (Signed)
Chronic Continue monthly B12 injections Due for B12 B12 injection today

## 2022-05-15 NOTE — Assessment & Plan Note (Signed)
Borderline ascending thoracic aneurysm Needs to follow-up with vascular-due for imaging Referral order to [name] is interested in switching vascular surgeons to who her husband sees

## 2022-05-15 NOTE — Assessment & Plan Note (Signed)
Acute Mild-intermittent Check UA, urine culture

## 2022-05-16 LAB — URINALYSIS, ROUTINE W REFLEX MICROSCOPIC
Bilirubin Urine: NEGATIVE
Hgb urine dipstick: NEGATIVE
Ketones, ur: NEGATIVE
Leukocytes,Ua: NEGATIVE
Nitrite: NEGATIVE
RBC / HPF: NONE SEEN (ref 0–?)
Specific Gravity, Urine: 1.015 (ref 1.000–1.030)
Total Protein, Urine: NEGATIVE
Urine Glucose: >=1000 — AB
Urobilinogen, UA: 0.2 (ref 0.0–1.0)
pH: 6 (ref 5.0–8.0)

## 2022-05-16 LAB — URINE CULTURE

## 2022-05-17 ENCOUNTER — Other Ambulatory Visit: Payer: Self-pay | Admitting: Internal Medicine

## 2022-05-21 ENCOUNTER — Encounter: Payer: Self-pay | Admitting: Internal Medicine

## 2022-05-22 ENCOUNTER — Ambulatory Visit (INDEPENDENT_AMBULATORY_CARE_PROVIDER_SITE_OTHER): Payer: Medicare Other

## 2022-05-22 VITALS — Ht 65.0 in | Wt 234.0 lb

## 2022-05-22 DIAGNOSIS — Z0000 Encounter for general adult medical examination without abnormal findings: Secondary | ICD-10-CM | POA: Diagnosis not present

## 2022-05-22 NOTE — Progress Notes (Signed)
I connected with  Kerry Powell on 05/22/22 by a audio enabled telemedicine application and verified that I am speaking with the correct person using two identifiers.  Patient Location: Home  Provider Location: Office/Clinic  I discussed the limitations of evaluation and management by telemedicine. The patient expressed understanding and agreed to proceed.  Subjective:   Kerry Powell is a 77 y.o. female who presents for Medicare Annual (Subsequent) preventive examination.  Review of Systems     Cardiac Risk Factors include: advanced age (>67men, >72 women);diabetes mellitus;hypertension;family history of premature cardiovascular disease;dyslipidemia;obesity (BMI >30kg/m2);sedentary lifestyle     Objective:    Today's Vitals   05/22/22 1141 05/22/22 1214  Weight: 234 lb (106.1 kg)   Height: 5\' 5"  (1.651 m)   PainSc: 8  8   PainLoc: Shoulder    Body mass index is 38.94 kg/m.     05/22/2022   12:21 PM 01/21/2022    9:43 AM 05/18/2021   11:28 AM 09/26/2020    8:00 AM 04/13/2020    2:43 PM 04/27/2019   10:12 AM 05/19/2018    9:48 AM  Advanced Directives  Does Patient Have a Medical Advance Directive? Yes Yes Yes No Yes Yes Yes  Type of Advance Directive Living will Living will [place];Living will   Living will [place];Living will  Does patient want to make changes to medical advance directive?  No - Patient declined   No - Patient declined No - Patient declined   Copy of [place] in Chart?   Yes - validated most recent copy scanned in chart (See row information)      Would patient like information on creating a medical advance directive?    No - Patient declined       Current Medications (verified) Outpatient Encounter Medications as of 05/22/2022  Medication Sig   ACETAMINOPHEN-BUTALBITAL 50-325 MG TABS TAKE 1 TABLET BY MOUTH ONCE DAILY AS NEEDED   albuterol (PROVENTIL) (2.5 MG/3ML) 0.083% nebulizer solution Take 3  mLs (2.5 mg total) by nebulization every 6 (six) hours as needed for wheezing or shortness of breath.   allopurinol (ZYLOPRIM) 300 MG tablet Take 1 tablet by mouth once daily   amLODipine (NORVASC) 5 MG tablet Take 1 tablet (5 mg total) by mouth daily.   cetirizine (ZYRTEC ALLERGY) 10 MG tablet 10 mg by oral route.   cholecalciferol (VITAMIN D3) 25 MCG (1000 UNIT) tablet Take 2,000 Units by mouth daily.   clopidogrel (PLAVIX) 75 MG tablet Take 1 tablet by mouth once daily   cyclobenzaprine (FLEXERIL) 5 MG tablet Take 1 tablet by mouth three times daily as needed for muscle spasm   DULoxetine (CYMBALTA) 30 MG capsule Take 1 capsule by mouth once daily   DULoxetine (CYMBALTA) 60 MG capsule Take 1 capsule by mouth once daily   FARXIGA 10 MG TABS tablet Take 10 mg by mouth daily.   ferrous sulfate 325 (65 FE) MG tablet Take 325 mg by mouth as needed.   fluticasone (FLONASE) 50 MCG/ACT nasal spray Place 2 sprays into both nostrils daily. (Patient taking differently: Place 2 sprays into both nostrils as needed.)   furosemide (LASIX) 20 MG tablet Take 1 tablet (20 mg total) by mouth daily.   meloxicam (MOBIC) 7.5 MG tablet Take 7.5 mg by mouth daily.   metoprolol tartrate (LOPRESSOR) 25 MG tablet Take 1 tablet (25 mg total) by mouth 2 (two) times daily. May take extra 25 mg tablet for  as needed palpitations   montelukast (SINGULAIR) 10 MG tablet TAKE 1 TABLET BY MOUTH ONCE DAILY AT BEDTIME   pantoprazole (PROTONIX) 40 MG tablet TAKE 1 TABLET BY MOUTH TWICE DAILY BEFORE A MEAL   rosuvastatin (CRESTOR) 40 MG tablet Take 1 tablet by mouth once daily   Semaglutide (RYBELSUS) 14 MG TABS Take 1 tablet (14 mg total) by mouth daily.   telmisartan (MICARDIS) 80 MG tablet Take 1 tablet (80 mg total) by mouth daily.   No facility-administered encounter medications on file as of 05/22/2022.    Allergies (verified) Gabapentin, Clindamycin, and Sulfa antibiotics   History: Past Medical History:  Diagnosis  Date   Allergy    Anemia    Anxiety    Arthritis    BACK PAIN, CHRONIC 04/04/2007   Qualifier: Diagnosis of  By: Carlean Purl MD, FACG, [place] AFFECTIVE DISORDER 04/04/2007   Qualifier: History of  By: Carlean Purl MD, Tonna Boehringer E    CAD (coronary artery disease), native coronary artery    mild CAD with no obstructive disease by coronary CTA 06/2019   Cataract    removed both eyes   Chronic neck pain 04/30/2015   Cervical spondylosis [place] [facility] Intermittent numbness and tingling in occipital region and headaches S/p 2 occipital nerve blocks - only temp relief no relief with gabapentin, ultram, brace MRI neck - facet arthrosis    CKD (chronic kidney disease) 04/26/2015   COPD (chronic obstructive pulmonary disease) ([place])    pt denies - told this while was smoking -   Cough 04/26/2015   Depression 07/01/2017   Diabetes ([place]) 04/27/2015   Diagnosed 03/2015    Dizziness 06/10/2017   Dysphagia 04/26/2015   DYSPNEA 12/24/2007   Qualifier: Diagnosis of  By: Glennis Brink     Elevated TSH 08/09/2015   GASTRIC ULCER, ACUTE 04/07/2007   Annotation: EGD 04/07/07 Qualifier: Diagnosis of  By: Carlean Purl MD, Tonna Boehringer E    GASTRITIS 04/07/2007   Annotation: EGD 04/07/07 Qualifier: Diagnosis of  By: Carlean Purl MD, Tonna Boehringer E    GERD (gastroesophageal reflux disease)    H/O renal cell carcinoma 05/23/2017   Dx 2010, s/p cryoablation Follows with urology - Dr Lovena Neighbours   HIATAL HERNIA 04/07/2007   Annotation: EGD 04/07/07 Qualifier: Diagnosis of  By: Carlean Purl MD, Tonna Boehringer E    History of CVA (cerebrovascular accident) 04/26/2015   History of gout 04/26/2015   Hyperlipidemia    Hypertension    Irritable bowel syndrome 04/04/2007   Qualifier: Diagnosis of  By: Carlean Purl MD, Tonna Boehringer E    Kidney tumor (benign)    Nausea and vomiting 09/09/2017   Occipital neuralgia 05/09/2016   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 Dr Dimas Millin in [place] 04/04/2007   Qualifier: Diagnosis of  By: Carlean Purl MD, Dimas Millin    Personal history of colonic adenoma 06/07/2012   POSTNASAL DRIP SYNDROME 12/29/2007   Qualifier: Diagnosis of  By: Chase Caller MD, [name]     RESTLESS LEG SYNDROME 04/04/2007   Qualifier: Diagnosis of  By: Carlean Purl MD, FACG, [place], 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   Qualifier: Diagnosis of  By: Carlean Purl MD, Dimas Millin    Sleep difficulties 09/09/2017   Snoring 04/26/2015   Stroke ([place])    x2 2011   Thoracic aortic aneurysm ([place]) 02/06/2018   Seen on MRI 01/25/18 of thoracic spine ordered by [facility] -- referred to CTS to monitor  -- 3.5  cm   TIA (transient ischemic attack)    TOBACCO ABUSE 12/17/2007   Qualifier: Diagnosis of  By: Chase Caller MD, [name]     Trigeminal neuralgia of right side of face 02/08/2016   URI (upper respiratory infection) 02/08/2016   Vitamin D deficiency 03/12/2018   Past Surgical History:  Procedure Laterality Date   ABDOMINAL HYSTERECTOMY     APPENDECTOMY     CATARACT EXTRACTION     COLONOSCOPY     CYSTOCELE REPAIR     ESOPHAGOGASTRODUODENOSCOPY     KIDNEY SURGERY     benign tumor removal   ORTHOPEDIC SURGERY     Wrist & elbow   POLYPECTOMY     RECTOCELE REPAIR     TONSILECTOMY, ADENOIDECTOMY, BILATERAL MYRINGOTOMY AND TUBES Bilateral 1968   UPPER GASTROINTESTINAL ENDOSCOPY     Family History  Problem Relation Age of Onset   Congestive Heart Failure Mother    Cancer Mother        ovarian   Heart disease Father    Diabetes Father    Kidney disease Father    Diabetes Sister    Kidney disease Sister    Colon cancer Neg Hx    Colon polyps Neg Hx    Esophageal cancer Neg Hx    Rectal cancer Neg Hx    Stomach cancer Neg Hx    Social History   Socioeconomic History   Marital status: Married    Spouse name: Not on file   Number of children: 2   Years of education: Not on file   Highest education level: Not on file  Occupational History   Occupation: Retired  Tobacco Use   Smoking status: Former    Packs/day: 1.00    Years:  46.00    Additional pack years: 0.00    Total pack years: 46.00    Types: Cigarettes    Quit date: 06/30/2009    Years since quitting: 12.9   Smokeless tobacco: Never  Vaping Use   Vaping Use: Never used  Substance and Sexual Activity   Alcohol use: No   Drug use: No   Sexual activity: Not on file  Other Topics Concern   Not on file  Social History Narrative   Married, retired   1 son 1 daughter   2 caffeine/day   2 story home, but doesn't have to use stairs - son stays upstairs   Social Determinants of Health   Financial Resource Strain: Low Risk  (05/22/2022)   Overall Financial Resource Strain (CARDIA)    Difficulty of Paying Living Expenses: Not hard at all  Food Insecurity: No Food Insecurity (05/22/2022)   Hunger Vital Sign    Worried About Running Out of Food in the Last Year: Never true    Ran Out of Food in the Last Year: Never true  Transportation Needs: No Transportation Needs (05/22/2022)   PRAPARE - Hydrologist (Medical): No    Lack of Transportation (Non-Medical): No  Physical Activity: Inactive (05/22/2022)   Exercise Vital Sign    Days of Exercise per Week: 0 days    Minutes of Exercise per Session: 0 min  Stress: Stress Concern Present (05/22/2022)   [place]    Feeling of Stress : Rather much  Social Connections: Unknown (05/22/2022)   Social Connection and Isolation Panel [NHANES]    Frequency of Communication with Friends and Family: Twice a week    Frequency of  Social Gatherings with Friends and Family: Once a week    Attends Religious Services: Not on Advertising copywriter or Organizations: No    Attends Archivist Meetings: Never    Marital Status: Married    Tobacco Counseling Counseling given: Not Answered   Clinical Intake:  Pre-visit preparation completed: Yes  Pain : 0-10 Pain Score: 8  Pain Type: Acute pain Pain  Location: Shoulder Pain Orientation: Left     BMI - recorded: 38.94 Nutritional Status: BMI > 30  Obese Nutritional Risks: None Diabetes: No  How often do you need to have someone help you when you read instructions, pamphlets, or other written materials from your doctor or pharmacy?: 1 - Never What is the last grade level you completed in school?: HSG  Nutrition Risk Assessment:  Has the patient had any N/V/D within the last 2 months?  No  Does the patient have any non-healing wounds?  No  Has the patient had any unintentional weight loss or weight gain?  No   Diabetes:  Is the patient diabetic?  Yes  If diabetic, was a CBG obtained today?  No  Did the patient bring in their glucometer from home?  No  How often do you monitor your CBG's? None.   Financial Strains and Diabetes Management:  Are you having any financial strains with the device, your supplies or your medication? No .  Does the patient want to be seen by Chronic Care Management for management of their diabetes?  No  Would the patient like to be referred to a Nutritionist or for Diabetic Management?  No   Diabetic Exams:  Diabetic Eye Exam: Completed 08/14/2021 Diabetic Foot Exam: Overdue, Pt has been advised about the importance in completing this exam. Pt is scheduled for diabetic foot exam on 11/16/2022.   Interpreter Needed?: No  Information entered by :: Lisette Abu, LPN.   Activities of Daily Living    05/22/2022   12:22 PM 05/18/2022    5:40 PM  In your present state of health, do you have any difficulty performing the following activities:  Hearing? 0 0  Vision? 0 0  Difficulty concentrating or making decisions? 0 0  Walking or climbing stairs? 1 1  Dressing or bathing? 0 0  Doing errands, shopping? 0 0  Preparing Food and eating ? N   Using the Toilet? N N  In the past six months, have you accidently leaked urine? N N  Do you have problems with loss of bowel control? N N  Managing your  Medications? N N  Managing your Finances? N N  Housekeeping or managing your Housekeeping? N N    Patient Care Team: Binnie Rail, MD as PCP - General (Internal Medicine) Sueanne Margarita, MD as PCP - Cardiology (Cardiology) Gatha Mayer, MD as Consulting Physician (Gastroenterology) Hortencia Pilar, MD as Consulting Physician (Ophthalmology) Spero Geralds, MD as Consulting Physician (Pulmonary Disease) Serafina Mitchell, MD as Consulting Physician (Vascular Surgery)  Indicate any recent Medical Services you may have received from other than [facility] providers in the past year (date may be approximate).     Assessment:   This is a routine wellness examination for [place].  Hearing/Vision screen Hearing Screening - Comments:: Denies hearing difficulties   Vision Screening - Comments:: Wears rx glasses - up to date with routine eye exams with Monna Fam, MD.   Dietary issues and exercise activities discussed: Current Exercise Habits: The  patient does not participate in regular exercise at present, Exercise limited by: respiratory conditions(s);orthopedic condition(s);neurologic condition(s)   Goals Addressed             This Visit's Progress    Stay as healthy and as independent as possible.        Depression Screen    05/22/2022   12:17 PM 05/15/2022    2:55 PM 02/02/2022    3:36 PM 11/10/2021    3:23 PM 05/18/2021   11:23 AM 04/25/2021   10:10 AM 04/13/2020    2:42 PM  PHQ 2/9 Scores  PHQ - 2 Score 2 2 0 0 2 0 0  PHQ- 9 Score 12 12   7       Fall Risk    05/22/2022   12:21 PM 05/18/2022    5:40 PM 05/15/2022    2:55 PM 02/02/2022    3:36 PM 11/10/2021    3:29 PM  [place] in the past year? 0 0 0 1 0  Number falls in past yr: 0 0 0 0 0  Injury with Fall? 0 0 0 1 0  Risk for fall due to : No Fall Risks  No Fall Risks No Fall Risks No Fall Risks  Follow up Falls prevention discussed  Falls evaluation completed Falls evaluation completed Falls  evaluation completed    [place]:  Any stairs in or around the home? Yes  If so, are there any without handrails? No  Home free of loose throw rugs in walkways, pet beds, electrical cords, etc? Yes  Adequate lighting in your home to reduce risk of falls? Yes   ASSISTIVE DEVICES UTILIZED TO PREVENT FALLS:  Life alert? No  Use of a cane, walker or w/c? No  Grab bars in the bathroom? No  Shower chair or bench in shower? No  Elevated toilet seat or a handicapped toilet? Yes   TIMED UP AND GO:  Was the test performed? No . Telephonic Visit  Cognitive Function:    11/19/2016    5:09 PM  MMSE - Mini Mental State Exam  Orientation to time 5  Orientation to Place 5  Registration 3  Attention/ Calculation 4  Recall 1  Language- name 2 objects 2  Language- repeat 1  Language- follow 3 step command 3  Language- read & follow direction 1  Write a sentence 1  Copy design 1  Total score 27        05/22/2022   12:23 PM  6CIT Screen  What Year? 0 points  What month? 0 points  What time? 0 points  Count back from 20 0 points  Months in reverse 0 points  Repeat phrase 0 points  Total Score 0 points    Immunizations Immunization History  Administered Date(s) Administered   PFIZER(Purple Top)SARS-COV-2 Vaccination 05/01/2019, 05/27/2019    TDAP Vaccine status: Declined  Flu Vaccine status: Declined, Education has been provided regarding the importance of this vaccine but patient still declined. Advised may receive this vaccine at local pharmacy or Health Dept. Aware to provide a copy of the vaccination record if obtained from local pharmacy or Health Dept. Verbalized acceptance and understanding.  Pneumococcal vaccine status: Declined,  Education has been provided regarding the importance of this vaccine but patient still declined. Advised may receive this vaccine at local pharmacy or Health Dept. Aware to provide a copy of the vaccination  record if obtained from local pharmacy or Health  Dept. Verbalized acceptance and understanding.   Covid-19 vaccine status: Completed vaccines  Qualifies for Shingles Vaccine? Yes   Zostavax completed No   Shingrix Completed?: No.    Education has been provided regarding the importance of this vaccine. Patient has been advised to call insurance company to determine out of pocket expense if they have not yet received this vaccine. Advised may also receive vaccine at local pharmacy or Health Dept. Verbalized acceptance and understanding.  Screening Tests Health Maintenance  Topic Date Due   DTaP/Tdap/Td (1 - Tdap) Never done   Zoster Vaccines- Shingrix (1 of 2) Never done   Pneumonia Vaccine 26+ Years old (1 of 1 - PCV) Never done   DEXA SCAN  Never done   FOOT EXAM  11/19/2017   Lung Cancer Screening  09/26/2021   INFLUENZA VACCINE  05/27/2022 (Originally 09/26/2021)   COVID-19 Vaccine (3 - Pfizer risk series) 05/31/2022 (Originally 06/24/2019)   OPHTHALMOLOGY EXAM  08/15/2022   HEMOGLOBIN A1C  11/15/2022   Diabetic kidney evaluation - eGFR measurement  05/15/2023   Diabetic kidney evaluation - Urine ACR  05/15/2023   Medicare Annual Wellness (AWV)  05/22/2023   Hepatitis C Screening  Completed   HPV VACCINES  Aged Out   COLONOSCOPY (Pts 45-46yrs Insurance coverage will need to be confirmed)  Discontinued    Health Maintenance  Health Maintenance Due  Topic Date Due   DTaP/Tdap/Td (1 - Tdap) Never done   Zoster Vaccines- Shingrix (1 of 2) Never done   Pneumonia Vaccine 76+ Years old (1 of 1 - PCV) Never done   DEXA SCAN  Never done   FOOT EXAM  11/19/2017   Lung Cancer Screening  09/26/2021    Colorectal cancer screening: No longer required.   Mammogram status: No longer required due to patient declined.  Bone Density status: Never done  Lung Cancer Screening: (Low Dose CT Chest recommended if Age 68-80 years, 30 pack-year currently smoking OR have quit w/in 15years.) does  qualify.   Lung Cancer Screening Referral: no  Additional Screening:  Hepatitis C Screening: does qualify; Completed 05/09/2016  Vision Screening: Recommended annual ophthalmology exams for early detection of glaucoma and other disorders of the eye. Is the patient up to date with their annual eye exam?  Yes  Who is the provider or what is the name of the office in which the patient attends annual eye exams? Monna Fam, MD. If pt is not established with a provider, would they like to be referred to a provider to establish care? No .   Dental Screening: Recommended annual dental exams for proper oral hygiene  Community Resource Referral / Chronic Care Management: CRR required this visit?  No   CCM required this visit?  No      Plan:     I have personally reviewed and noted the following in the patient's chart:   Medical and social history Use of alcohol, tobacco or illicit drugs  Current medications and supplements including opioid prescriptions. Patient is not currently taking opioid prescriptions. Functional ability and status Nutritional status Physical activity Advanced directives List of other physicians Hospitalizations, surgeries, and ER visits in previous 12 months Vitals Screenings to include cognitive, depression, and falls Referrals and appointments  In addition, I have reviewed and discussed with patient certain preventive protocols, quality metrics, and best practice recommendations. A written personalized care plan for preventive services as well as general preventive health recommendations were provided to patient.     Jule Ser  [place] Guitar, Anthony   [phone]   Nurse Notes:  Normal cognitive status assessed by direct observation by this Nurse Health Advisor. No abnormalities found.

## 2022-05-22 NOTE — Patient Instructions (Signed)
Kerry Powell , Thank you for taking time to come for your Medicare Wellness Visit. I appreciate your ongoing commitment to your health goals. Please review the following plan we discussed and let me know if I can assist you in the future.   These are the goals we discussed:  Goals      Stay as healthy and as independent as possible.        This is a list of the screening recommended for you and due dates:  Health Maintenance  Topic Date Due   DTaP/Tdap/Td vaccine (1 - Tdap) Never done   Zoster (Shingles) Vaccine (1 of 2) Never done   Pneumonia Vaccine (1 of 1 - PCV) Never done   DEXA scan (bone density measurement)  Never done   Complete foot exam   11/19/2017   Screening for Lung Cancer  09/26/2021   Flu Shot  05/27/2022*   COVID-19 Vaccine (3 - Pfizer risk series) 05/31/2022*   Eye exam for diabetics  08/15/2022   Hemoglobin A1C  11/15/2022   Yearly kidney function blood test for diabetes  05/15/2023   Yearly kidney health urinalysis for diabetes  05/15/2023   Medicare Annual Wellness Visit  05/22/2023   Hepatitis C Screening: USPSTF Recommendation to screen - Ages 18-79 yo.  Completed   HPV Vaccine  Aged Out   Colon Cancer Screening  Discontinued  *Topic was postponed. The date shown is not the original due date.    Advanced directives: YES  Conditions/risks identified: YES  Next appointment: Follow up in one year for your annual wellness visit.   Preventive Care 31 Years and Older, Female Preventive care refers to lifestyle choices and visits with your health care provider that can promote health and wellness. What does preventive care include? A yearly physical exam. This is also called an annual well check. Dental exams once or twice a year. Routine eye exams. Ask your health care provider how often you should have your eyes checked. Personal lifestyle choices, including: Daily care of your teeth and gums. Regular physical activity. Eating a healthy diet. Avoiding  tobacco and drug use. Limiting alcohol use. Practicing safe sex. Taking low-dose aspirin every day. Taking vitamin and mineral supplements as recommended by your health care provider. What happens during an annual well check? The services and screenings done by your health care provider during your annual well check will depend on your age, overall health, lifestyle risk factors, and family history of disease. Counseling  Your health care provider may ask you questions about your: Alcohol use. Tobacco use. Drug use. Emotional well-being. Home and relationship well-being. Sexual activity. Eating habits. History of falls. Memory and ability to understand (cognition). Work and work Statistician. Reproductive health. Screening  You may have the following tests or measurements: Height, weight, and BMI. Blood pressure. Lipid and cholesterol levels. These may be checked every 5 years, or more frequently if you are over 12 years old. Skin check. Lung cancer screening. You may have this screening every year starting at age 18 if you have a 30-pack-year history of smoking and currently smoke or have quit within the past 15 years. Fecal occult blood test (FOBT) of the stool. You may have this test every year starting at age 90. Flexible sigmoidoscopy or colonoscopy. You may have a sigmoidoscopy every 5 years or a colonoscopy every 10 years starting at age 24. Hepatitis C blood test. Hepatitis B blood test. Sexually transmitted disease (STD) testing. Diabetes screening. This is done by  checking your blood sugar (glucose) after you have not eaten for a while (fasting). You may have this done every 1-3 years. Bone density scan. This is done to screen for osteoporosis. You may have this done starting at age 30. Mammogram. This may be done every 1-2 years. Talk to your health care provider about how often you should have regular mammograms. Talk with your health care provider about your test  results, treatment options, and if necessary, the need for more tests. Vaccines  Your health care provider may recommend certain vaccines, such as: Influenza vaccine. This is recommended every year. Tetanus, diphtheria, and acellular pertussis (Tdap, Td) vaccine. You may need a Td booster every 10 years. Zoster vaccine. You may need this after age 17. Pneumococcal 13-valent conjugate (PCV13) vaccine. One dose is recommended after age 85. Pneumococcal polysaccharide (PPSV23) vaccine. One dose is recommended after age 72. Talk to your health care provider about which screenings and vaccines you need and how often you need them. This information is not intended to replace advice given to you by your health care provider. Make sure you discuss any questions you have with your health care provider. Document Released: 03/11/2015 Document Revised: 11/02/2015 Document Reviewed: 12/14/2014 Elsevier Interactive Patient Education  2017 [place] Prevention in the Home Falls can cause injuries. They can happen to people of all ages. There are many things you can do to make your home safe and to help prevent falls. What can I do on the outside of my home? Regularly fix the edges of walkways and driveways and fix any cracks. Remove anything that might make you trip as you walk through a door, such as a raised step or threshold. Trim any bushes or trees on the path to your home. Use bright outdoor lighting. Clear any walking paths of anything that might make someone trip, such as rocks or tools. Regularly check to see if handrails are loose or broken. Make sure that both sides of any steps have handrails. Any raised decks and porches should have guardrails on the edges. Have any leaves, snow, or ice cleared regularly. Use sand or salt on walking paths during winter. Clean up any spills in your garage right away. This includes oil or grease spills. What can I do in the bathroom? Use night  lights. Install grab bars by the toilet and in the tub and shower. Do not use towel bars as grab bars. Use non-skid mats or decals in the tub or shower. If you need to sit down in the shower, use a plastic, non-slip stool. Keep the floor dry. Clean up any water that spills on the floor as soon as it happens. Remove soap buildup in the tub or shower regularly. Attach bath mats securely with double-sided non-slip rug tape. Do not have throw rugs and other things on the floor that can make you trip. What can I do in the bedroom? Use night lights. Make sure that you have a light by your bed that is easy to reach. Do not use any sheets or blankets that are too big for your bed. They should not hang down onto the floor. Have a firm chair that has side arms. You can use this for support while you get dressed. Do not have throw rugs and other things on the floor that can make you trip. What can I do in the kitchen? Clean up any spills right away. Avoid walking on wet floors. Keep items that you use a  lot in easy-to-reach places. If you need to reach something above you, use a strong step stool that has a grab bar. Keep electrical cords out of the way. Do not use floor polish or wax that makes floors slippery. If you must use wax, use non-skid floor wax. Do not have throw rugs and other things on the floor that can make you trip. What can I do with my stairs? Do not leave any items on the stairs. Make sure that there are handrails on both sides of the stairs and use them. Fix handrails that are broken or loose. Make sure that handrails are as long as the stairways. Check any carpeting to make sure that it is firmly attached to the stairs. Fix any carpet that is loose or worn. Avoid having throw rugs at the top or bottom of the stairs. If you do have throw rugs, attach them to the floor with carpet tape. Make sure that you have a light switch at the top of the stairs and the bottom of the stairs. If  you do not have them, ask someone to add them for you. What else can I do to help prevent falls? Wear shoes that: Do not have high heels. Have rubber bottoms. Are comfortable and fit you well. Are closed at the toe. Do not wear sandals. If you use a stepladder: Make sure that it is fully opened. Do not climb a closed stepladder. Make sure that both sides of the stepladder are locked into place. Ask someone to hold it for you, if possible. Clearly mark and make sure that you can see: Any grab bars or handrails. First and last steps. Where the edge of each step is. Use tools that help you move around (mobility aids) if they are needed. These include: Canes. Walkers. Scooters. Crutches. Turn on the lights when you go into a dark area. Replace any light bulbs as soon as they burn out. Set up your furniture so you have a clear path. Avoid moving your furniture around. If any of your floors are uneven, fix them. If there are any pets around you, be aware of where they are. Review your medicines with your doctor. Some medicines can make you feel dizzy. This can increase your chance of falling. Ask your doctor what other things that you can do to help prevent falls. This information is not intended to replace advice given to you by your health care provider. Make sure you discuss any questions you have with your health care provider. Document Released: 12/09/2008 Document Revised: 07/21/2015 Document Reviewed: 03/19/2014 Elsevier Interactive Patient Education  2017 [employer].

## 2022-05-24 ENCOUNTER — Other Ambulatory Visit: Payer: Self-pay

## 2022-05-24 DIAGNOSIS — I712 Thoracic aortic aneurysm, without rupture, unspecified: Secondary | ICD-10-CM

## 2022-06-04 ENCOUNTER — Ambulatory Visit: Payer: Medicare Other | Admitting: Surgery

## 2022-06-05 ENCOUNTER — Other Ambulatory Visit (HOSPITAL_COMMUNITY): Payer: Self-pay

## 2022-06-07 ENCOUNTER — Other Ambulatory Visit (HOSPITAL_COMMUNITY): Payer: Self-pay

## 2022-06-10 ENCOUNTER — Other Ambulatory Visit (HOSPITAL_COMMUNITY): Payer: Self-pay

## 2022-06-16 ENCOUNTER — Encounter: Payer: Self-pay | Admitting: Internal Medicine

## 2022-06-18 ENCOUNTER — Telehealth: Payer: Self-pay

## 2022-06-18 ENCOUNTER — Other Ambulatory Visit: Payer: Self-pay

## 2022-06-18 ENCOUNTER — Ambulatory Visit (INDEPENDENT_AMBULATORY_CARE_PROVIDER_SITE_OTHER): Payer: Medicare Other

## 2022-06-18 ENCOUNTER — Other Ambulatory Visit (HOSPITAL_COMMUNITY): Payer: Self-pay

## 2022-06-18 DIAGNOSIS — E538 Deficiency of other specified B group vitamins: Secondary | ICD-10-CM

## 2022-06-18 MED ORDER — CYANOCOBALAMIN 1000 MCG/ML IJ SOLN
1000.0000 ug | Freq: Once | INTRAMUSCULAR | Status: AC
  Administered 2022-06-18: 1000 ug via INTRAMUSCULAR

## 2022-06-18 NOTE — Progress Notes (Signed)
After obtaining consent, and per orders of Dr. Burns, injection of B12 given by Zameer Borman P Swannie Milius. Patient instructed to report any adverse reaction to me immediately.  

## 2022-06-18 NOTE — Telephone Encounter (Signed)
Saw this encounter and submitted online application to prevent any further delay. Pt has been approved for patient assistance for Farxiga through [employer]. Will need a new rx sent to [employer] by provider, will fax request/instructions for e-escribing.

## 2022-06-18 NOTE — Telephone Encounter (Signed)
Pt assistant forms have been mailed twice with no response from pt.  Submitted [employer] application online, pt has been approved for pt assistance for Farxiga until 02/26/2023. Faxing to provider's office to have them send in new rx.

## 2022-06-19 ENCOUNTER — Other Ambulatory Visit: Payer: Self-pay

## 2022-06-19 MED ORDER — FARXIGA 10 MG PO TABS: 10.0000 mg | ORAL_TABLET | Freq: Every day | ORAL | 3 refills | Status: DC

## 2022-06-19 NOTE — Telephone Encounter (Signed)
Please send rx via phone [phone]), fax [phone]) or e-scribe:

## 2022-06-21 ENCOUNTER — Other Ambulatory Visit: Payer: Self-pay | Admitting: Internal Medicine

## 2022-06-27 ENCOUNTER — Other Ambulatory Visit: Payer: Self-pay

## 2022-06-27 MED ORDER — FARXIGA 10 MG PO TABS: 10.0000 mg | ORAL_TABLET | Freq: Every day | ORAL | 3 refills | Status: DC

## 2022-06-28 NOTE — Telephone Encounter (Signed)
Pt has actually been approved for Farxiga through [employer] until 02/26/2023. I checked on the status of her medication and it looks like it has already shipped out. Pt should see it soon! Here is tracking information in case pt requires it. Thanks for following up!

## 2022-07-16 ENCOUNTER — Encounter: Payer: Self-pay | Admitting: Internal Medicine

## 2022-07-17 NOTE — Telephone Encounter (Signed)
Form faxed

## 2022-07-19 ENCOUNTER — Other Ambulatory Visit (HOSPITAL_COMMUNITY): Payer: Self-pay

## 2022-07-19 ENCOUNTER — Other Ambulatory Visit: Payer: Self-pay | Admitting: Internal Medicine

## 2022-07-19 ENCOUNTER — Telehealth: Payer: Self-pay

## 2022-07-19 NOTE — Telephone Encounter (Signed)
  Received a fax from [facility] regarding Prior Authorization for Butalbital-Acetaminophen 50-325MG  tablets.   Authorization has been DENIED due to  The requested drug is not on your plan's formulary.  [facility] [phone]

## 2022-07-20 ENCOUNTER — Ambulatory Visit: Payer: Medicare Other

## 2022-07-25 ENCOUNTER — Ambulatory Visit: Payer: Medicare Other

## 2022-08-15 ENCOUNTER — Ambulatory Visit: Payer: Medicare Other

## 2022-08-15 ENCOUNTER — Other Ambulatory Visit: Payer: Self-pay | Admitting: Internal Medicine

## 2022-08-15 MED ORDER — FARXIGA 10 MG PO TABS: 10.0000 mg | ORAL_TABLET | Freq: Every day | ORAL | 3 refills | Status: DC

## 2022-08-17 LAB — HM DIABETES EYE EXAM

## 2022-09-14 NOTE — Telephone Encounter (Signed)
Please see encounter on 05/08/22 for updated information

## 2022-09-16 ENCOUNTER — Other Ambulatory Visit: Payer: Self-pay | Admitting: Internal Medicine

## 2022-09-25 ENCOUNTER — Encounter: Payer: Self-pay | Admitting: Internal Medicine

## 2022-09-25 ENCOUNTER — Ambulatory Visit: Payer: Medicare Other | Admitting: Internal Medicine

## 2022-09-25 VITALS — BP 116/72 | HR 65 | Temp 98.2°F | Ht 65.0 in | Wt 239.0 lb

## 2022-09-25 DIAGNOSIS — E538 Deficiency of other specified B group vitamins: Secondary | ICD-10-CM

## 2022-09-25 DIAGNOSIS — I10 Essential (primary) hypertension: Secondary | ICD-10-CM

## 2022-09-25 DIAGNOSIS — F419 Anxiety disorder, unspecified: Secondary | ICD-10-CM

## 2022-09-25 DIAGNOSIS — R300 Dysuria: Secondary | ICD-10-CM | POA: Diagnosis not present

## 2022-09-25 LAB — POC URINALSYSI DIPSTICK (AUTOMATED)
Bilirubin, UA: NEGATIVE
Blood, UA: NEGATIVE
Glucose, UA: POSITIVE — AB
Ketones, UA: NEGATIVE
Leukocytes, UA: NEGATIVE
Nitrite, UA: NEGATIVE
Protein, UA: NEGATIVE
Spec Grav, UA: 1.015
Urobilinogen, UA: 0.2 U/dL
pH, UA: 6

## 2022-09-25 MED ORDER — CYANOCOBALAMIN 1000 MCG/ML IJ SOLN
1000.0000 ug | Freq: Once | INTRAMUSCULAR | Status: AC
Start: 2022-09-25 — End: 2022-09-25
  Administered 2022-09-25: 1000 ug via INTRAMUSCULAR

## 2022-09-25 NOTE — Assessment & Plan Note (Signed)
Chronic Not ideally controlled - some of her symptoms today are likely related to stress - she is also emotional eating and has gained weight - concern for sugars not being controlled Advised increasing cymbalta to 60 mg daily - she will think about this and let me know. Stress management discussed

## 2022-09-25 NOTE — Assessment & Plan Note (Signed)
Acute Also having some suprapubic tenderness and right lower back pain ?  UTI Urine dip negative for UTI-will send for culture Will hold off on antibiotics unless culture is positive Dysuria is mild-could be related to mild cystitis or other cause Has zerostat at home and will take that to see if that helps She will call if symptoms worsen prior to culture coming back

## 2022-09-25 NOTE — Progress Notes (Signed)
Subjective:    Patient ID: Kerry Powell, female    DOB: May 26, 1945, 77 y.o.   MRN: [id number]      HPI Kerry Powell is here for  Chief Complaint  Patient presents with   Urinary Tract Infection    Discomfort with urination and right side back pain    Symptoms started the end of last week - pain in right lower back, feeling heavy - esp in lower abdomen, dysuria.  She feels like she is going part-gaining weight.  She thinks she knows why because of increased stress.  Increased stress - lots of family issues.    Her legs have been more swollen.  She is hands hoarseness.  She is anxious and stressed.  Right lower back pain worse with standing, movement.  Medications and allergies reviewed with patient and updated if appropriate.  Current Outpatient Medications on File Prior to Visit  Medication Sig Dispense Refill   ACETAMINOPHEN-BUTALBITAL 50-325 MG TABS TAKE 1 TABLET BY MOUTH ONCE DAILY AS NEEDED 60 tablet 0   albuterol (PROVENTIL) (2.5 MG/3ML) 0.083% nebulizer solution Take 3 mLs (2.5 mg total) by nebulization every 6 (six) hours as needed for wheezing or shortness of breath. 75 mL 12   allopurinol (ZYLOPRIM) 300 MG tablet Take 1 tablet by mouth once daily 90 tablet 0   amLODipine (NORVASC) 5 MG tablet Take 1 tablet (5 mg total) by mouth daily. 90 tablet 1   cetirizine (ZYRTEC ALLERGY) 10 MG tablet 10 mg by oral route.     cholecalciferol (VITAMIN D3) 25 MCG (1000 UNIT) tablet Take 2,000 Units by mouth daily.     clopidogrel (PLAVIX) 75 MG tablet Take 1 tablet by mouth once daily 90 tablet 0   cyclobenzaprine (FLEXERIL) 5 MG tablet Take 1 tablet by mouth three times daily as needed for muscle spasm 90 tablet 0   DULoxetine (CYMBALTA) 30 MG capsule Take 1 capsule by mouth once daily 90 capsule 1   FARXIGA 10 MG TABS tablet Take 1 tablet (10 mg total) by mouth daily. Take 10 mg by mouth daily. 90 tablet 3   ferrous sulfate 325 (65 FE) MG tablet Take 325 mg by mouth as needed.      fluticasone (FLONASE) 50 MCG/ACT nasal spray Place 2 sprays into both nostrils daily. (Patient taking differently: Place 2 sprays into both nostrils as needed.) 11.1 mL 11   furosemide (LASIX) 20 MG tablet Take 1 tablet (20 mg total) by mouth daily. 90 tablet 2   meloxicam (MOBIC) 15 MG tablet Take 15 mg by mouth daily.     meloxicam (MOBIC) 7.5 MG tablet Take 7.5 mg by mouth daily.     montelukast (SINGULAIR) 10 MG tablet TAKE 1 TABLET BY MOUTH ONCE DAILY AT BEDTIME 30 tablet 0   pantoprazole (PROTONIX) 40 MG tablet TAKE 1 TABLET BY MOUTH TWICE DAILY BEFORE A MEAL 180 tablet 1   rosuvastatin (CRESTOR) 40 MG tablet Take 1 tablet by mouth once daily 90 tablet 3   Semaglutide (RYBELSUS) 14 MG TABS Take 1 tablet (14 mg total) by mouth daily. 90 tablet 1   telmisartan (MICARDIS) 80 MG tablet Take 1 tablet (80 mg total) by mouth daily.     metoprolol tartrate (LOPRESSOR) 25 MG tablet Take 1 tablet (25 mg total) by mouth 2 (two) times daily. May take extra 25 mg tablet for as needed palpitations 180 tablet 3   No current facility-administered medications on file prior to visit.    Review  of Systems  Constitutional:  Negative for fever.  HENT:  Positive for voice change.   Cardiovascular:  Positive for leg swelling.  Gastrointestinal:  Negative for abdominal pain, blood in stool, constipation, diarrhea and nausea.       No gerd  Genitourinary:  Positive for dysuria (mild). Negative for difficulty urinating, hematuria and pelvic pain (heaviness in lower abdomen).  Musculoskeletal:  Positive for back pain.       Burning or soreness when she touches certain areas in her body - muscles  Psychiatric/Behavioral:  The patient is nervous/anxious.        Objective:   Vitals:   09/25/22 1011  BP: 116/72  Pulse: 65  Temp: 98.2 F (36.8 C)  SpO2: 95%   BP Readings from Last 3 Encounters:  09/25/22 116/72  05/15/22 128/80  03/20/22 110/68   Wt Readings from Last 3 Encounters:  09/25/22 239 lb  (108.4 kg)  05/22/22 234 lb (106.1 kg)  05/15/22 234 lb (106.1 kg)   Body mass index is 39.77 kg/m.    Physical Exam Constitutional:      General: She is not in acute distress.    Appearance: Normal appearance.  HENT:     Head: Normocephalic and atraumatic.  Eyes:     Conjunctiva/sclera: Conjunctivae normal.  Cardiovascular:     Rate and Rhythm: Normal rate and regular rhythm.     Heart sounds: Normal heart sounds.  Pulmonary:     Effort: Pulmonary effort is normal. No respiratory distress.     Breath sounds: Normal breath sounds. No wheezing.  Abdominal:     General: There is no distension.     Palpations: Abdomen is soft.     Tenderness: There is abdominal tenderness (Mild, suprapubic region). There is no guarding or rebound.  Musculoskeletal:        General: Tenderness (Right lower back with palpation) present.     Cervical back: Neck supple.     Right lower leg: No edema.     Left lower leg: No edema.  Lymphadenopathy:     Cervical: No cervical adenopathy.  Skin:    General: Skin is warm and dry.     Findings: No rash.  Neurological:     Mental Status: She is alert. Mental status is at baseline.  Psychiatric:        Mood and Affect: Mood normal.        Behavior: Behavior normal.            Assessment & Plan:    See Problem List for Assessment and Plan of chronic medical problems.    I spent 30 minutes dedicated to the care of this patient on the date of this encounter including review of recent labs, obtaining history, communicating with the patient, ordering medications and documenting clinical information in the EHR

## 2022-09-25 NOTE — Assessment & Plan Note (Signed)
Chronic Well controlled here today Continue current medications - amlodipine 5 mg daily, metoprolol 25 mg twice daily, telmisartan 80 mg daily

## 2022-09-25 NOTE — Patient Instructions (Addendum)
     B12 injection today   We will send your urine for a culture.    Medications changes include :   none     Return if symptoms worsen or fail to improve.

## 2022-09-25 NOTE — Assessment & Plan Note (Signed)
Chronic Continue monthly B12 injections Due for B12 B12 injection today

## 2022-10-11 ENCOUNTER — Encounter (INDEPENDENT_AMBULATORY_CARE_PROVIDER_SITE_OTHER): Payer: Self-pay

## 2022-11-03 ENCOUNTER — Other Ambulatory Visit: Payer: Self-pay | Admitting: Cardiology

## 2022-11-03 ENCOUNTER — Other Ambulatory Visit: Payer: Self-pay | Admitting: Internal Medicine

## 2022-11-16 ENCOUNTER — Ambulatory Visit: Payer: Medicare Other | Admitting: Internal Medicine

## 2022-11-18 NOTE — Patient Instructions (Addendum)
      Blood work was ordered.   The lab is on the first floor.    Medications changes include :   none      Return in about 6 months (around 05/19/2023) for Physical Exam.

## 2022-11-18 NOTE — Progress Notes (Signed)
Subjective:    Patient ID: Kerry Powell, female    DOB: 01-11-1946, 77 y.o.   MRN: [id number]     HPI Kerry Powell is here for follow up of her chronic medical problems.    Medications and allergies reviewed with patient and updated if appropriate.  Current Outpatient Medications on File Prior to Visit  Medication Sig Dispense Refill  . ACETAMINOPHEN-BUTALBITAL 50-325 MG TABS TAKE 1 TABLET BY MOUTH ONCE DAILY AS NEEDED 60 tablet 0  . albuterol (PROVENTIL) (2.5 MG/3ML) 0.083% nebulizer solution Take 3 mLs (2.5 mg total) by nebulization every 6 (six) hours as needed for wheezing or shortness of breath. 75 mL 12  . allopurinol (ZYLOPRIM) 300 MG tablet Take 1 tablet by mouth once daily 90 tablet 0  . amLODipine (NORVASC) 5 MG tablet Take 1 tablet (5 mg total) by mouth daily. 90 tablet 1  . cetirizine (ZYRTEC ALLERGY) 10 MG tablet 10 mg by oral route.    . cholecalciferol (VITAMIN D3) 25 MCG (1000 UNIT) tablet Take 2,000 Units by mouth daily.    . DULoxetine (CYMBALTA) 30 MG capsule Take 1 capsule by mouth once daily 90 capsule 1  . FARXIGA 10 MG TABS tablet Take 1 tablet (10 mg total) by mouth daily. Take 10 mg by mouth daily. 90 tablet 3  . ferrous sulfate 325 (65 FE) MG tablet Take 325 mg by mouth as needed.    . fluticasone (FLONASE) 50 MCG/ACT nasal spray Place 2 sprays into both nostrils daily. (Patient taking differently: Place 2 sprays into both nostrils as needed.) 11.1 mL 11  . furosemide (LASIX) 20 MG tablet Take 1 tablet (20 mg total) by mouth daily. 90 tablet 2  . meloxicam (MOBIC) 15 MG tablet Take 15 mg by mouth daily.    . metoprolol tartrate (LOPRESSOR) 25 MG tablet Take 1 tablet (25 mg total) by mouth 2 (two) times daily. May take extra 25 mg tablet for as needed palpitations 180 tablet 3  . montelukast (SINGULAIR) 10 MG tablet TAKE 1 TABLET BY MOUTH ONCE DAILY AT BEDTIME 30 tablet 0  . pantoprazole (PROTONIX) 40 MG tablet TAKE 1 TABLET BY MOUTH TWICE DAILY BEFORE A MEAL  180 tablet 1  . rosuvastatin (CRESTOR) 40 MG tablet Take 1 tablet by mouth once daily 90 tablet 1  . Semaglutide (RYBELSUS) 14 MG TABS Take 1 tablet (14 mg total) by mouth daily. 90 tablet 1  . telmisartan (MICARDIS) 80 MG tablet Take 1 tablet (80 mg total) by mouth daily.     No current facility-administered medications on file prior to visit.     Review of Systems     Objective:  There were no vitals filed for this visit. BP Readings from Last 3 Encounters:  12/05/22 108/70  09/25/22 116/72  05/15/22 128/80   Wt Readings from Last 3 Encounters:  12/05/22 233 lb (105.7 kg)  09/25/22 239 lb (108.4 kg)  05/22/22 234 lb (106.1 kg)   There is no height or weight on file to calculate BMI.    Physical Exam     Lab Results  Component Value Date   WBC 7.3 12/05/2022   HGB 15.6 (H) 12/05/2022   HCT 48.5 (H) 12/05/2022   PLT 250.0 12/05/2022   GLUCOSE 94 12/05/2022   CHOL 137 12/05/2022   TRIG 162.0 (H) 12/05/2022   HDL 47.10 12/05/2022   LDLDIRECT 79.0 08/01/2020   LDLCALC 58 12/05/2022   ALT 24 12/05/2022   AST 22  12/05/2022   NA 138 12/05/2022   K 4.5 12/05/2022   CL 102 12/05/2022   CREATININE 1.73 (H) 12/05/2022   BUN 21 12/05/2022   CO2 30 12/05/2022   TSH 3.48 08/01/2020   INR 0.91 06/30/2009   HGBA1C 7.0 (H) 12/05/2022   MICROALBUR <0.7 05/15/2022     Assessment & Plan:    See Problem List for Assessment and Plan of chronic medical problems.    This encounter was created in error - please disregard.

## 2022-11-19 ENCOUNTER — Encounter: Payer: Medicare Other | Admitting: Internal Medicine

## 2022-11-19 DIAGNOSIS — K219 Gastro-esophageal reflux disease without esophagitis: Secondary | ICD-10-CM

## 2022-11-19 DIAGNOSIS — E782 Mixed hyperlipidemia: Secondary | ICD-10-CM

## 2022-11-19 DIAGNOSIS — I2510 Atherosclerotic heart disease of native coronary artery without angina pectoris: Secondary | ICD-10-CM

## 2022-11-19 DIAGNOSIS — N1832 Chronic kidney disease, stage 3b: Secondary | ICD-10-CM

## 2022-11-19 DIAGNOSIS — F3289 Other specified depressive episodes: Secondary | ICD-10-CM

## 2022-11-19 DIAGNOSIS — E538 Deficiency of other specified B group vitamins: Secondary | ICD-10-CM

## 2022-11-19 DIAGNOSIS — I10 Essential (primary) hypertension: Secondary | ICD-10-CM

## 2022-11-19 DIAGNOSIS — F419 Anxiety disorder, unspecified: Secondary | ICD-10-CM

## 2022-11-19 DIAGNOSIS — E1122 Type 2 diabetes mellitus with diabetic chronic kidney disease: Secondary | ICD-10-CM

## 2022-11-19 DIAGNOSIS — E1142 Type 2 diabetes mellitus with diabetic polyneuropathy: Secondary | ICD-10-CM

## 2022-11-22 ENCOUNTER — Other Ambulatory Visit: Payer: Self-pay | Admitting: Internal Medicine

## 2022-12-04 ENCOUNTER — Encounter: Payer: Self-pay | Admitting: Internal Medicine

## 2022-12-04 NOTE — Patient Instructions (Addendum)
      Blood work was ordered.   The lab is on the first floor.    Medications changes include :       A referral was ordered and someone will call you to schedule an appointment.     Return in about 6 months (around 06/05/2023) for follow up.

## 2022-12-04 NOTE — Progress Notes (Unsigned)
Subjective:    Patient ID: Kerry Powell, female    DOB: 05/20/1945, 77 y.o.   MRN: [id number]     HPI Kerry Powell is here for follow up of her chronic medical problems.    Medications and allergies reviewed with patient and updated if appropriate.  Current Outpatient Medications on File Prior to Visit  Medication Sig Dispense Refill   ACETAMINOPHEN-BUTALBITAL 50-325 MG TABS TAKE 1 TABLET BY MOUTH ONCE DAILY AS NEEDED 60 tablet 0   albuterol (PROVENTIL) (2.5 MG/3ML) 0.083% nebulizer solution Take 3 mLs (2.5 mg total) by nebulization every 6 (six) hours as needed for wheezing or shortness of breath. 75 mL 12   allopurinol (ZYLOPRIM) 300 MG tablet Take 1 tablet by mouth once daily 90 tablet 0   amLODipine (NORVASC) 5 MG tablet Take 1 tablet (5 mg total) by mouth daily. 90 tablet 1   cetirizine (ZYRTEC ALLERGY) 10 MG tablet 10 mg by oral route.     cholecalciferol (VITAMIN D3) 25 MCG (1000 UNIT) tablet Take 2,000 Units by mouth daily.     clopidogrel (PLAVIX) 75 MG tablet Take 1 tablet by mouth once daily 90 tablet 0   cyclobenzaprine (FLEXERIL) 5 MG tablet Take 1 tablet by mouth three times daily as needed for muscle spasm 270 tablet 1   DULoxetine (CYMBALTA) 30 MG capsule Take 1 capsule by mouth once daily 90 capsule 1   FARXIGA 10 MG TABS tablet Take 1 tablet (10 mg total) by mouth daily. Take 10 mg by mouth daily. 90 tablet 3   ferrous sulfate 325 (65 FE) MG tablet Take 325 mg by mouth as needed.     fluticasone (FLONASE) 50 MCG/ACT nasal spray Place 2 sprays into both nostrils daily. (Patient taking differently: Place 2 sprays into both nostrils as needed.) 11.1 mL 11   furosemide (LASIX) 20 MG tablet Take 1 tablet (20 mg total) by mouth daily. 90 tablet 2   meloxicam (MOBIC) 15 MG tablet Take 15 mg by mouth daily.     meloxicam (MOBIC) 7.5 MG tablet Take 7.5 mg by mouth daily.     metoprolol tartrate (LOPRESSOR) 25 MG tablet Take 1 tablet (25 mg total) by mouth 2 (two) times  daily. May take extra 25 mg tablet for as needed palpitations 180 tablet 3   montelukast (SINGULAIR) 10 MG tablet TAKE 1 TABLET BY MOUTH ONCE DAILY AT BEDTIME 30 tablet 0   pantoprazole (PROTONIX) 40 MG tablet TAKE 1 TABLET BY MOUTH TWICE DAILY BEFORE A MEAL 180 tablet 1   rosuvastatin (CRESTOR) 40 MG tablet Take 1 tablet by mouth once daily 90 tablet 1   Semaglutide (RYBELSUS) 14 MG TABS Take 1 tablet (14 mg total) by mouth daily. 90 tablet 1   telmisartan (MICARDIS) 80 MG tablet Take 1 tablet (80 mg total) by mouth daily.     No current facility-administered medications on file prior to visit.     Review of Systems     Objective:  There were no vitals filed for this visit. BP Readings from Last 3 Encounters:  09/25/22 116/72  05/15/22 128/80  03/20/22 110/68   Wt Readings from Last 3 Encounters:  09/25/22 239 lb (108.4 kg)  05/22/22 234 lb (106.1 kg)  05/15/22 234 lb (106.1 kg)   There is no height or weight on file to calculate BMI.    Physical Exam     Lab Results  Component Value Date   WBC 9.6 05/15/2022  HGB 14.9 05/15/2022   HCT 45.6 05/15/2022   PLT 299.0 05/15/2022   GLUCOSE 73 05/15/2022   CHOL 136 05/15/2022   TRIG 105.0 05/15/2022   HDL 52.60 05/15/2022   LDLDIRECT 79.0 08/01/2020   LDLCALC 62 05/15/2022   ALT 20 05/15/2022   AST 17 05/15/2022   NA 137 05/15/2022   K 4.6 05/15/2022   CL 100 05/15/2022   CREATININE 1.64 (H) 05/15/2022   BUN 29 (H) 05/15/2022   CO2 28 05/15/2022   TSH 3.48 08/01/2020   INR 0.91 06/30/2009   HGBA1C 7.1 (H) 05/15/2022   MICROALBUR <0.7 05/15/2022     Assessment & Plan:    See Problem List for Assessment and Plan of chronic medical problems.

## 2022-12-05 ENCOUNTER — Ambulatory Visit: Payer: Medicare Other | Admitting: Internal Medicine

## 2022-12-05 VITALS — BP 108/70 | HR 72 | Temp 98.1°F | Ht 65.0 in | Wt 233.0 lb

## 2022-12-05 DIAGNOSIS — N1832 Chronic kidney disease, stage 3b: Secondary | ICD-10-CM | POA: Diagnosis not present

## 2022-12-05 DIAGNOSIS — I2510 Atherosclerotic heart disease of native coronary artery without angina pectoris: Secondary | ICD-10-CM

## 2022-12-05 DIAGNOSIS — F3289 Other specified depressive episodes: Secondary | ICD-10-CM

## 2022-12-05 DIAGNOSIS — I10 Essential (primary) hypertension: Secondary | ICD-10-CM | POA: Diagnosis not present

## 2022-12-05 DIAGNOSIS — K219 Gastro-esophageal reflux disease without esophagitis: Secondary | ICD-10-CM | POA: Diagnosis not present

## 2022-12-05 DIAGNOSIS — Z7984 Long term (current) use of oral hypoglycemic drugs: Secondary | ICD-10-CM

## 2022-12-05 DIAGNOSIS — M1A30X0 Chronic gout due to renal impairment, unspecified site, without tophus (tophi): Secondary | ICD-10-CM

## 2022-12-05 DIAGNOSIS — E1142 Type 2 diabetes mellitus with diabetic polyneuropathy: Secondary | ICD-10-CM

## 2022-12-05 DIAGNOSIS — E538 Deficiency of other specified B group vitamins: Secondary | ICD-10-CM | POA: Diagnosis not present

## 2022-12-05 DIAGNOSIS — N1831 Chronic kidney disease, stage 3a: Secondary | ICD-10-CM

## 2022-12-05 DIAGNOSIS — E1122 Type 2 diabetes mellitus with diabetic chronic kidney disease: Secondary | ICD-10-CM

## 2022-12-05 DIAGNOSIS — F419 Anxiety disorder, unspecified: Secondary | ICD-10-CM

## 2022-12-05 LAB — COMPREHENSIVE METABOLIC PANEL
ALT: 24 U/L (ref 0–35)
AST: 22 U/L (ref 0–37)
Albumin: 4.3 g/dL (ref 3.5–5.2)
Alkaline Phosphatase: 101 U/L (ref 39–117)
BUN: 21 mg/dL (ref 6–23)
CO2: 30 meq/L (ref 19–32)
Calcium: 9.5 mg/dL (ref 8.4–10.5)
Chloride: 102 meq/L (ref 96–112)
Creatinine, Ser: 1.73 mg/dL — ABNORMAL HIGH (ref 0.40–1.20)
GFR: 28.11 mL/min — ABNORMAL LOW (ref >60.00)
Glucose, Bld: 94 mg/dL (ref 70–99)
Potassium: 4.5 meq/L (ref 3.5–5.1)
Sodium: 138 meq/L (ref 135–145)
Total Bilirubin: 0.7 mg/dL (ref 0.2–1.2)
Total Protein: 6.6 g/dL (ref 6.0–8.3)

## 2022-12-05 LAB — LIPID PANEL
Cholesterol: 137 mg/dL (ref 0–200)
HDL: 47.1 mg/dL (ref >39.00)
LDL Cholesterol: 58 mg/dL (ref 0–99)
NonHDL: 90.23
Total CHOL/HDL Ratio: 3
Triglycerides: 162 mg/dL — ABNORMAL HIGH (ref 0.0–149.0)
VLDL: 32.4 mg/dL (ref 0.0–40.0)

## 2022-12-05 LAB — HEMOGLOBIN A1C: Hgb A1c MFr Bld: 7 % — ABNORMAL HIGH (ref 4.6–6.5)

## 2022-12-05 MED ORDER — CYANOCOBALAMIN 1000 MCG/ML IJ SOLN
1000.0000 ug | Freq: Once | INTRAMUSCULAR | Status: AC
Start: 2022-12-05 — End: 2022-12-05
  Administered 2022-12-05: 1000 ug via INTRAMUSCULAR

## 2022-12-05 NOTE — Assessment & Plan Note (Signed)
Chronic   Lab Results  Component Value Date   HGBA1C 7.1 (H) 05/15/2022   Sugars somewhat controlled Check A1c Continue Rybelsus to 14 mg daily, Farxiga 10 mg daily Stressed regular exercise, diabetic diet Encouraged weight loss

## 2022-12-05 NOTE — Assessment & Plan Note (Signed)
Chronic Continue monthly B12 injections Due for B12 B12 injection today

## 2022-12-05 NOTE — Assessment & Plan Note (Signed)
Chronic Controlled, stable - no episodes of gout Continue allopurinol 300 mg daily

## 2022-12-05 NOTE — Assessment & Plan Note (Signed)
Chronic Controlled, Stable Continue duloxetine 30 mg daily 

## 2022-12-05 NOTE — Assessment & Plan Note (Signed)
Chronic No angina like symptoms Following with cardiology Continue current medications

## 2022-12-05 NOTE — Assessment & Plan Note (Signed)
Chronic Controlled Continue cymbalta to 30 mg daily

## 2022-12-05 NOTE — Assessment & Plan Note (Signed)
Chronic Well controlled here today Cmp BP Readings from Last 3 Encounters:  12/05/22 108/70  09/25/22 116/72  05/15/22 128/80   Continue current medications - amlodipine 5 mg daily, metoprolol 25 mg twice daily, telmisartan 80 mg daily

## 2022-12-05 NOTE — Assessment & Plan Note (Signed)
Chronic CBC, CMP On Farxiga 10 mg daily Following with Dr. Malen Gauze

## 2022-12-05 NOTE — Assessment & Plan Note (Signed)
Chronic  likely diabetic neuropathy Unable to take gabapentin because of fluid retention Continue cymbalta 30 mg daily

## 2022-12-05 NOTE — Assessment & Plan Note (Signed)
Chronic GERD controlled Continue pantoprazole 40 mg bid

## 2022-12-06 LAB — CBC WITH DIFFERENTIAL/PLATELET
Basophils Absolute: 0.1 10*3/uL (ref 0.0–0.1)
Basophils Relative: 1 % (ref 0.0–3.0)
Eosinophils Absolute: 0.2 10*3/uL (ref 0.0–0.7)
Eosinophils Relative: 3.3 % (ref 0.0–5.0)
HCT: 48.5 % — ABNORMAL HIGH (ref 36.0–46.0)
Hemoglobin: 15.6 g/dL — ABNORMAL HIGH (ref 12.0–15.0)
Lymphocytes Relative: 22.5 % (ref 12.0–46.0)
Lymphs Abs: 1.6 10*3/uL (ref 0.7–4.0)
MCHC: 32.1 g/dL (ref 30.0–36.0)
MCV: 99.9 fL (ref 78.0–100.0)
Monocytes Absolute: 0.7 10*3/uL (ref 0.1–1.0)
Monocytes Relative: 10.2 % (ref 3.0–12.0)
Neutro Abs: 4.6 10*3/uL (ref 1.4–7.7)
Neutrophils Relative %: 63 % (ref 43.0–77.0)
Platelets: 250 10*3/uL (ref 150.0–400.0)
RBC: 4.86 Mil/uL (ref 3.87–5.11)
RDW: 14.7 % (ref 11.5–15.5)
WBC: 7.3 10*3/uL (ref 4.0–10.5)

## 2022-12-16 ENCOUNTER — Encounter: Payer: Self-pay | Admitting: Internal Medicine

## 2022-12-16 ENCOUNTER — Other Ambulatory Visit: Payer: Self-pay | Admitting: Internal Medicine

## 2022-12-26 ENCOUNTER — Telehealth: Payer: Self-pay | Admitting: Internal Medicine

## 2022-12-26 MED ORDER — BUTALBITAL-ACETAMINOPHEN 50-325 MG PO TABS: ORAL_TABLET | ORAL | 0 refills | Status: DC

## 2022-12-26 NOTE — Telephone Encounter (Signed)
Patient called and said she would like to speak with [facility] regarding a medication. She declined to provide further information. She would like a call back at [phone].

## 2022-12-31 ENCOUNTER — Telehealth: Payer: Self-pay | Admitting: Pharmacist

## 2022-12-31 ENCOUNTER — Other Ambulatory Visit (HOSPITAL_COMMUNITY): Payer: Self-pay

## 2022-12-31 NOTE — Telephone Encounter (Signed)
Pharmacy Patient Advocate Encounter   Received notification from Patient Pharmacy that prior authorization for Cyclobenzaprine HCl 5MG  tablets is required/requested.   Insurance verification completed.   The patient is insured through [facility] .   Per test claim: PA required; PA submitted to above mentioned insurance via CoverMyMeds Key/confirmation #/EOC [id number] Status is pending

## 2022-12-31 NOTE — Telephone Encounter (Signed)
Pharmacy Patient Advocate Encounter  Received notification from [facility] that Prior Authorization for Cyclobenzaprine HCl 5MG  tablets has been APPROVED from 12/31/2022 to 03/31/2023. Ran test claim, Copay is $13.16 for 90 tablets. This test claim was processed through [facility]- copay amounts may vary at other pharmacies due to pharmacy/plan contracts, or as the patient moves through the different stages of their insurance plan.   PA #/Case ID/Reference #: [id number]

## 2023-01-01 NOTE — Telephone Encounter (Signed)
Patient notified via my-chart of approval. 

## 2023-02-01 ENCOUNTER — Encounter: Payer: Self-pay | Admitting: Internal Medicine

## 2023-02-01 ENCOUNTER — Other Ambulatory Visit: Payer: Self-pay

## 2023-02-01 ENCOUNTER — Other Ambulatory Visit: Payer: Self-pay | Admitting: Internal Medicine

## 2023-02-01 MED ORDER — PANTOPRAZOLE SODIUM 40 MG PO TBEC: 40.0000 mg | DELAYED_RELEASE_TABLET | Freq: Two times a day (BID) | ORAL | 1 refills | Status: DC

## 2023-02-05 ENCOUNTER — Encounter: Payer: Self-pay | Admitting: Internal Medicine

## 2023-02-06 MED ORDER — BUTALBITAL-ACETAMINOPHEN 50-325 MG PO TABS: ORAL_TABLET | ORAL | 0 refills | Status: DC

## 2023-02-10 ENCOUNTER — Other Ambulatory Visit: Payer: Self-pay

## 2023-02-10 ENCOUNTER — Observation Stay (HOSPITAL_BASED_OUTPATIENT_CLINIC_OR_DEPARTMENT_OTHER)
Admission: EM | Admit: 2023-02-10 | Discharge: 2023-02-12 | Disposition: A | Discharge status: Home / Self Care | Payer: Medicare Other | Attending: Internal Medicine | Admitting: Internal Medicine

## 2023-02-10 ENCOUNTER — Encounter (HOSPITAL_BASED_OUTPATIENT_CLINIC_OR_DEPARTMENT_OTHER): Payer: Self-pay | Admitting: Emergency Medicine

## 2023-02-10 ENCOUNTER — Emergency Department (HOSPITAL_BASED_OUTPATIENT_CLINIC_OR_DEPARTMENT_OTHER): Payer: Medicare Other

## 2023-02-10 DIAGNOSIS — E1159 Type 2 diabetes mellitus with other circulatory complications: Secondary | ICD-10-CM | POA: Diagnosis present

## 2023-02-10 DIAGNOSIS — I10 Essential (primary) hypertension: Secondary | ICD-10-CM | POA: Diagnosis present

## 2023-02-10 DIAGNOSIS — E785 Hyperlipidemia, unspecified: Secondary | ICD-10-CM | POA: Diagnosis present

## 2023-02-10 DIAGNOSIS — S2249XA Multiple fractures of ribs, unspecified side, initial encounter for closed fracture: Secondary | ICD-10-CM | POA: Diagnosis present

## 2023-02-10 DIAGNOSIS — N1832 Chronic kidney disease, stage 3b: Secondary | ICD-10-CM | POA: Diagnosis not present

## 2023-02-10 DIAGNOSIS — Z79899 Other long term (current) drug therapy: Secondary | ICD-10-CM | POA: Diagnosis not present

## 2023-02-10 DIAGNOSIS — D72829 Elevated white blood cell count, unspecified: Secondary | ICD-10-CM | POA: Diagnosis present

## 2023-02-10 DIAGNOSIS — F319 Bipolar disorder, unspecified: Secondary | ICD-10-CM | POA: Diagnosis present

## 2023-02-10 DIAGNOSIS — E1122 Type 2 diabetes mellitus with diabetic chronic kidney disease: Secondary | ICD-10-CM | POA: Diagnosis not present

## 2023-02-10 DIAGNOSIS — J449 Chronic obstructive pulmonary disease, unspecified: Secondary | ICD-10-CM | POA: Insufficient documentation

## 2023-02-10 DIAGNOSIS — I130 Hypertensive heart and chronic kidney disease with heart failure and stage 1 through stage 4 chronic kidney disease, or unspecified chronic kidney disease: Secondary | ICD-10-CM | POA: Diagnosis not present

## 2023-02-10 DIAGNOSIS — I5032 Chronic diastolic (congestive) heart failure: Secondary | ICD-10-CM | POA: Diagnosis not present

## 2023-02-10 DIAGNOSIS — S2243XA Multiple fractures of ribs, bilateral, initial encounter for closed fracture: Principal | ICD-10-CM | POA: Diagnosis present

## 2023-02-10 DIAGNOSIS — S299XXA Unspecified injury of thorax, initial encounter: Secondary | ICD-10-CM | POA: Diagnosis present

## 2023-02-10 DIAGNOSIS — E1169 Type 2 diabetes mellitus with other specified complication: Secondary | ICD-10-CM | POA: Diagnosis present

## 2023-02-10 DIAGNOSIS — Z87891 Personal history of nicotine dependence: Secondary | ICD-10-CM | POA: Insufficient documentation

## 2023-02-10 DIAGNOSIS — Z8709 Personal history of other diseases of the respiratory system: Secondary | ICD-10-CM

## 2023-02-10 DIAGNOSIS — N184 Chronic kidney disease, stage 4 (severe): Secondary | ICD-10-CM | POA: Diagnosis present

## 2023-02-10 DIAGNOSIS — W1830XA Fall on same level, unspecified, initial encounter: Secondary | ICD-10-CM | POA: Diagnosis not present

## 2023-02-10 DIAGNOSIS — Z8673 Personal history of transient ischemic attack (TIA), and cerebral infarction without residual deficits: Secondary | ICD-10-CM | POA: Insufficient documentation

## 2023-02-10 DIAGNOSIS — D631 Anemia in chronic kidney disease: Secondary | ICD-10-CM | POA: Insufficient documentation

## 2023-02-10 DIAGNOSIS — I2510 Atherosclerotic heart disease of native coronary artery without angina pectoris: Secondary | ICD-10-CM | POA: Insufficient documentation

## 2023-02-10 DIAGNOSIS — W19XXXA Unspecified fall, initial encounter: Principal | ICD-10-CM | POA: Insufficient documentation

## 2023-02-10 DIAGNOSIS — I152 Hypertension secondary to endocrine disorders: Secondary | ICD-10-CM | POA: Diagnosis present

## 2023-02-10 DIAGNOSIS — E119 Type 2 diabetes mellitus without complications: Secondary | ICD-10-CM

## 2023-02-10 LAB — CBC WITH DIFFERENTIAL/PLATELET
Abs Immature Granulocytes: 0.04 10*3/uL (ref 0.00–0.07)
Basophils Absolute: 0 10*3/uL (ref 0.0–0.1)
Basophils Relative: 0 %
Eosinophils Absolute: 0.2 10*3/uL (ref 0.0–0.5)
Eosinophils Relative: 1 %
HCT: 44.9 % (ref 36.0–46.0)
Hemoglobin: 14.6 g/dL (ref 12.0–15.0)
Immature Granulocytes: 0 %
Lymphocytes Relative: 14 %
Lymphs Abs: 1.7 10*3/uL (ref 0.7–4.0)
MCH: 31.8 pg (ref 26.0–34.0)
MCHC: 32.5 g/dL (ref 30.0–36.0)
MCV: 97.8 fL (ref 80.0–100.0)
Monocytes Absolute: 0.6 10*3/uL (ref 0.1–1.0)
Monocytes Relative: 5 %
Neutro Abs: 9.3 10*3/uL — ABNORMAL HIGH (ref 1.7–7.7)
Neutrophils Relative %: 80 %
Platelets: 279 10*3/uL (ref 150–400)
RBC: 4.59 MIL/uL (ref 3.87–5.11)
RDW: 14.1 % (ref 11.5–15.5)
WBC: 11.8 10*3/uL — ABNORMAL HIGH (ref 4.0–10.5)
nRBC: 0 % (ref 0.0–0.2)

## 2023-02-10 LAB — BASIC METABOLIC PANEL
Anion gap: 9 (ref 5–15)
BUN: 38 mg/dL — ABNORMAL HIGH (ref 8–23)
CO2: 20 mmol/L — ABNORMAL LOW (ref 22–32)
Calcium: 8.5 mg/dL — ABNORMAL LOW (ref 8.9–10.3)
Chloride: 105 mmol/L (ref 98–111)
Creatinine, Ser: 2.08 mg/dL — ABNORMAL HIGH (ref 0.44–1.00)
GFR, Estimated: 24 mL/min — ABNORMAL LOW (ref >60)
Glucose, Bld: 194 mg/dL — ABNORMAL HIGH (ref 70–99)
Potassium: 4.9 mmol/L (ref 3.5–5.1)
Sodium: 134 mmol/L — ABNORMAL LOW (ref 135–145)

## 2023-02-10 LAB — TROPONIN I (HIGH SENSITIVITY): Troponin I (High Sensitivity): 4 ng/L (ref <18)

## 2023-02-10 MED ORDER — MORPHINE SULFATE (PF) 4 MG/ML IV SOLN
4.0000 mg | INTRAVENOUS | Status: DC | PRN
  Administered 2023-02-10: 4 mg via INTRAVENOUS
  Filled 2023-02-10: qty 1

## 2023-02-10 MED ORDER — ONDANSETRON HCL 4 MG/2ML IJ SOLN
4.0000 mg | Freq: Once | INTRAMUSCULAR | Status: AC
  Administered 2023-02-10: 4 mg via INTRAVENOUS
  Filled 2023-02-10: qty 2

## 2023-02-10 MED ORDER — ONDANSETRON HCL 4 MG/2ML IJ SOLN: 4.0000 mg | Freq: Four times a day (QID) | INTRAMUSCULAR | Status: DC | PRN

## 2023-02-10 MED ORDER — MORPHINE SULFATE (PF) 4 MG/ML IV SOLN: 4.0000 mg | INTRAVENOUS | Status: DC | PRN

## 2023-02-10 MED ORDER — OXYCODONE HCL 5 MG PO TABS
5.0000 mg | ORAL_TABLET | Freq: Once | ORAL | Status: AC
  Administered 2023-02-10: 5 mg via ORAL
  Filled 2023-02-10: qty 1

## 2023-02-10 MED ORDER — ACETAMINOPHEN 325 MG PO TABS
650.0000 mg | ORAL_TABLET | Freq: Four times a day (QID) | ORAL | Status: DC | PRN
  Administered 2023-02-11: 650 mg via ORAL
  Filled 2023-02-10: qty 2

## 2023-02-10 MED ORDER — MELATONIN 3 MG PO TABS
3.0000 mg | ORAL_TABLET | Freq: Every evening | ORAL | Status: DC | PRN
  Administered 2023-02-11 (×2): 3 mg via ORAL
  Filled 2023-02-10 (×2): qty 1

## 2023-02-10 MED ORDER — SODIUM CHLORIDE 0.9 % IV BOLUS
1000.0000 mL | Freq: Once | INTRAVENOUS | Status: AC
  Administered 2023-02-10: 1000 mL via INTRAVENOUS

## 2023-02-10 MED ORDER — ACETAMINOPHEN 650 MG RE SUPP: 650.0000 mg | Freq: Four times a day (QID) | RECTAL | Status: DC | PRN

## 2023-02-10 NOTE — Consult Note (Signed)
Reason for Consult/Chief Complaint: rib fx Consultant: Doran Durand, MD  Kerry Powell is an 77 y.o. female.   HPI: 52F with HTN, CKD 3B, CAD, HFpEF, and COPD who presents with rib pain and shortness of breath after mechanical fall on 12/13. Denies LOC. Difficulty with pain control at home prompted her to come in and be seen.   Sees Dr. Malen Gauze at [facility]. Not on home O2.   Past Medical History:  Diagnosis Date   Allergy    Anemia    Anxiety    Arthritis    BACK PAIN, CHRONIC 04/04/2007   Qualifier: Diagnosis of  By: Leone Payor MD, Charlyne Quale    BIPOLAR AFFECTIVE DISORDER 04/04/2007   Qualifier: History of  By: Leone Payor MD, Alfonse Ras E    CAD (coronary artery disease), native coronary artery    mild CAD with no obstructive disease by coronary CTA 06/2019   Cataract    removed both eyes   Chronic neck pain 04/30/2015   Cervical spondylosis [facility] Intermittent numbness and tingling in occipital region and headaches S/p 2 occipital nerve blocks - only temp relief no relief with gabapentin, ultram, brace MRI neck - facet arthrosis    CKD (chronic kidney disease) 04/26/2015   COPD (chronic obstructive pulmonary disease) (HCC)    pt denies - told this while was smoking -   Cough 04/26/2015   Depression 07/01/2017   Diabetes (HCC) 04/27/2015   Diagnosed 03/2015    Dizziness 06/10/2017   Dysphagia 04/26/2015   DYSPNEA 12/24/2007   Qualifier: Diagnosis of  By: Galen Manila     Elevated TSH 08/09/2015   GASTRIC ULCER, ACUTE 04/07/2007   Annotation: EGD 04/07/07 Qualifier: Diagnosis of  By: Leone Payor MD, Alfonse Ras E    GASTRITIS 04/07/2007   Annotation: EGD 04/07/07 Qualifier: Diagnosis of  By: Leone Payor MD, Alfonse Ras E    GERD (gastroesophageal reflux disease)    H/O renal cell carcinoma 05/23/2017   Dx 2010, s/p cryoablation Follows with urology - Dr Liliane Shi   HIATAL HERNIA 04/07/2007   Annotation: EGD 04/07/07 Qualifier: Diagnosis of  By: Leone Payor MD, Alfonse Ras E    History of  CVA (cerebrovascular accident) 04/26/2015   History of gout 04/26/2015   Hyperlipidemia    Hypertension    Irritable bowel syndrome 04/04/2007   Qualifier: Diagnosis of  By: Leone Payor MD, Charlyne Quale    Kidney tumor (benign)    Nausea and vomiting 09/09/2017   Occipital neuralgia 05/09/2016   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 Dr Leanna Battles in [place]   OSTEOARTHRITIS 04/04/2007   Qualifier: Diagnosis of  By: Leone Payor MD, Charlyne Quale    Personal history of colonic adenoma 06/07/2012   POSTNASAL DRIP SYNDROME 12/29/2007   Qualifier: Diagnosis of  By: Marchelle Gearing MD, [name]     RESTLESS LEG SYNDROME 04/04/2007   Qualifier: Diagnosis of  By: Leone Payor MD, Charlyne Quale    RHINOSINUSITIS, 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   Qualifier: Diagnosis of  By: Leone Payor MD, Charlyne Quale    Sleep difficulties 09/09/2017   Snoring 04/26/2015   Stroke (HCC)    x2 2011   Thoracic aortic aneurysm (HCC) 02/06/2018   Seen on MRI 01/25/18 of thoracic spine ordered by [facility] -- referred to CTS to monitor  -- 3.5 cm   TIA (transient ischemic attack)    TOBACCO ABUSE 12/17/2007   Qualifier: Diagnosis of  By: Marchelle Gearing MD, [name]     Trigeminal neuralgia of right  side of face 02/08/2016   URI (upper respiratory infection) 02/08/2016   Vitamin D deficiency 03/12/2018    Past Surgical History:  Procedure Laterality Date   ABDOMINAL HYSTERECTOMY     APPENDECTOMY     CATARACT EXTRACTION     COLONOSCOPY     CYSTOCELE REPAIR     ESOPHAGOGASTRODUODENOSCOPY     KIDNEY SURGERY     benign tumor removal   ORTHOPEDIC SURGERY     Wrist & elbow   POLYPECTOMY     RECTOCELE REPAIR     TONSILECTOMY, ADENOIDECTOMY, BILATERAL MYRINGOTOMY AND TUBES Bilateral 1968   UPPER GASTROINTESTINAL ENDOSCOPY      Family History  Problem Relation Age of Onset   Congestive Heart Failure Mother    Cancer Mother        ovarian   Heart disease Father    Diabetes Father    Kidney disease Father    Diabetes Sister    Kidney disease Sister    Colon  cancer Neg Hx    Colon polyps Neg Hx    Esophageal cancer Neg Hx    Rectal cancer Neg Hx    Stomach cancer Neg Hx     Social History:  reports that she quit smoking about 13 years ago. Her smoking use included cigarettes. She started smoking about 59 years ago. She has a 46 pack-year smoking history. She has never used smokeless tobacco. She reports that she does not drink alcohol and does not use drugs.  Allergies:  Allergies  Allergen Reactions   Gabapentin Other (See Comments)    Fluid retention    Clindamycin Rash   Sulfa Antibiotics Nausea And Vomiting    Medications: I have reviewed the patient's current medications.  Results for orders placed or performed during the hospital encounter of 02/10/23 (from the past 48 hours)  CBC with Differential     Status: Abnormal   Collection Time: 02/10/23  8:07 PM  Result Value Ref Range   WBC 11.8 (H) 4.0 - 10.5 K/uL   RBC 4.59 3.87 - 5.11 MIL/uL   Hemoglobin 14.6 12.0 - 15.0 g/dL   HCT 40.9 81.1 - 91.4 %   MCV 97.8 80.0 - 100.0 fL   MCH 31.8 26.0 - 34.0 pg   MCHC 32.5 30.0 - 36.0 g/dL   RDW 78.2 95.6 - 21.3 %   Platelets 279 150 - 400 K/uL   nRBC 0.0 0.0 - 0.2 %   Neutrophils Relative % 80 %   Neutro Abs 9.3 (H) 1.7 - 7.7 K/uL   Lymphocytes Relative 14 %   Lymphs Abs 1.7 0.7 - 4.0 K/uL   Monocytes Relative 5 %   Monocytes Absolute 0.6 0.1 - 1.0 K/uL   Eosinophils Relative 1 %   Eosinophils Absolute 0.2 0.0 - 0.5 K/uL   Basophils Relative 0 %   Basophils Absolute 0.0 0.0 - 0.1 K/uL   Immature Granulocytes 0 %   Abs Immature Granulocytes 0.04 0.00 - 0.07 K/uL    Comment: Performed at [facility], [address] [place], [name] [address]  Basic metabolic panel     Status: Abnormal   Collection Time: 02/10/23  8:07 PM  Result Value Ref Range   Sodium 134 (L) 135 - 145 mmol/L   Potassium 4.9 3.5 - 5.1 mmol/L   Chloride 105 98 - 111 mmol/L   CO2 20 (L) 22 - 32 mmol/L   Glucose, Bld 194 (H) 70 - 99 mg/dL  Comment: Glucose reference range applies only to samples taken after fasting for at least 8 hours.   BUN 38 (H) 8 - 23 mg/dL   Creatinine, Ser 4.09 (H) 0.44 - 1.00 mg/dL   Calcium 8.5 (L) 8.9 - 10.3 mg/dL   GFR, Estimated 24 (L) >60 mL/min    Comment: (NOTE) Calculated using the CKD-EPI Creatinine Equation (2021)    Anion gap 9 5 - 15    Comment: Performed at [facility], [address] [place], [name] [address]  Troponin I (High Sensitivity)     Status: None   Collection Time: 02/10/23  8:07 PM  Result Value Ref Range   Troponin I (High Sensitivity) 4 <18 ng/L    Comment: (NOTE) Elevated high sensitivity troponin I (hsTnI) values and significant  changes across serial measurements may suggest ACS but many other  chronic and acute conditions are known to elevate hsTnI results.  Refer to the "Links" section for chest pain algorithms and additional  guidance. Performed at [facility], [address], [place], [name] [address]     CT Chest Wo Contrast Result Date: 02/10/2023 CLINICAL DATA:  Blunt chest trauma after fall 2 days ago. Bilateral rib pain. Bruising to left breast. Shortness of breath since fall. EXAM: CT CHEST WITHOUT CONTRAST TECHNIQUE: Multidetector CT imaging of the chest was performed following the standard protocol without IV contrast. RADIATION DOSE REDUCTION: This exam was performed according to the departmental dose-optimization program which includes automated exposure control, adjustment of the mA and/or kV according to patient size and/or use of iterative reconstruction technique. COMPARISON:  09/26/2020 FINDINGS: Cardiovascular: Coronary artery and aortic atherosclerotic calcification. No pericardial effusion. Mediastinum/Nodes: Trachea and esophagus are unremarkable. No thoracic adenopathy. Lungs/Pleura: Emphysema. Biapical pleural-parenchymal scarring. No focal consolidation, pleural effusion, or pneumothorax. Upper Abdomen: No acute  abnormality. Musculoskeletal: Acute nondisplaced buckle fractures of the anterior right 5th-8th ribs. Acute minimally displaced fractures of the left anterior 5th-7th ribs. IMPRESSION: Nondisplaced fractures of the right anterior 5th-8th ribs and minimally displaced fractures of the left 5th-7th ribs. Aortic Atherosclerosis (ICD10-I70.0) and Emphysema (ICD10-J43.9). Electronically Signed   By: Minerva Fester M.D.   On: 02/10/2023 21:02    ROS 10 point review of systems is negative except as listed above in HPI.   Physical Exam Blood pressure 129/84, pulse 100, temperature (!) 97.5 F (36.4 C), resp. rate 20, height 5\' 5"  (1.651 m), weight 102.5 kg, SpO2 92%. Constitutional: well-developed, well-nourished HEENT: pupils equal, round, reactive to light, 2mm b/l, moist conjunctiva, external inspection of ears and nose normal, hearing intact Oropharynx: normal oropharyngeal mucosa, poor dentition Neck: no thyromegaly, trachea midline, no midline cervical tenderness to palpation Chest: breath sounds equal bilaterally, normal respiratory effort, no midline or lateral chest wall tenderness to palpation/deformity Abdomen: soft, NT, no bruising, no hepatosplenomegaly Skin: warm, dry, no rashes Psych: normal memory, normal mood/affect     Assessment/Plan: Mechanical fall   B rib fx L5-7, R5-8 - pain control, pulm toilet, IS  FEN - regular diet DVT - SCDs, LMWH Dispo - med-surg    Diamantina Monks, MD General and Trauma Surgery [facility]

## 2023-02-10 NOTE — ED Triage Notes (Signed)
Pt fell 2 days ago from standing position, face-first onto carpeted flooring; c/o CP bil rib pain; reports bruising to LT breast; SHOB since fall

## 2023-02-10 NOTE — ED Notes (Signed)
Pt fell 2 days ago in living room face first C/o bilateral rib pain and chest pain.  Bruising noted to left breast and rt. Side under breast is slightly swollen

## 2023-02-10 NOTE — ED Provider Notes (Signed)
[place] [facility] Provider Note   CSN: [id number] Arrival date & time: 02/10/23  1935     History Chief Complaint  Patient presents with   Fall   Shortness of Breath    HPI Kerry Powell is a 77 y.o. female presenting for ground-level fall shortness of breath fatigue and weakness. She is a 77 year old female with a expansive medical history who fell 2 days ago She lost her balance while reaching for a laundry basket and fell. She has had shortness of breath, decreased p.o. intake, substantial chest pain all throughout the past few days. Denies fevers chills, nausea vomiting syncope or abdominal pain at this time.  She has been ambulatory though in a limited fashion.  Patient's recorded medical, surgical, social, medication list and allergies were reviewed in the Snapshot window as part of the initial history.   Review of Systems   Review of Systems  Constitutional:  Negative for chills and fever.  HENT:  Negative for ear pain and sore throat.   Eyes:  Negative for pain and visual disturbance.  Respiratory:  Positive for cough and shortness of breath.   Cardiovascular:  Positive for chest pain. Negative for palpitations.  Gastrointestinal:  Negative for abdominal pain and vomiting.  Genitourinary:  Negative for dysuria and hematuria.  Musculoskeletal:  Negative for arthralgias and back pain.  Skin:  Negative for color change and rash.  Neurological:  Negative for seizures and syncope.  All other systems reviewed and are negative.   Physical Exam Updated Vital Signs BP 129/84 (BP Location: Right Arm)   Pulse 100   Temp (!) 97.5 F (36.4 C)   Resp 20   Ht 5\' 5"  (1.651 m)   Wt 102.5 kg   SpO2 92%   BMI 37.61 kg/m  Physical Exam Vitals and nursing note reviewed.  Constitutional:      General: She is not in acute distress.    Appearance: She is well-developed.  HENT:     Head: Normocephalic and atraumatic.  Eyes:      Conjunctiva/sclera: Conjunctivae normal.  Cardiovascular:     Rate and Rhythm: Normal rate and regular rhythm.     Heart sounds: No murmur heard. Pulmonary:     Effort: Pulmonary effort is normal. No respiratory distress.     Breath sounds: Normal breath sounds.  Abdominal:     Palpations: Abdomen is soft.     Tenderness: There is no abdominal tenderness.  Musculoskeletal:        General: Signs of injury present. No swelling.     Cervical back: Neck supple. Tenderness present.  Skin:    General: Skin is warm and dry.     Capillary Refill: Capillary refill takes less than 2 seconds.  Neurological:     Mental Status: She is alert.  Psychiatric:        Mood and Affect: Mood normal.      ED Course/ Medical Decision Making/ A&P    Procedures .Critical Care  Performed by: Glyn Ade, MD Authorized by: Glyn Ade, MD   Critical care provider statement:    Critical care time (minutes):  30   Critical care was necessary to treat or prevent imminent or life-threatening deterioration of the following conditions:  Trauma   Critical care was time spent personally by me on the following activities:  Development of treatment plan with patient or surrogate, discussions with consultants, evaluation of patient's response to treatment, examination of patient, ordering and  review of laboratory studies, ordering and review of radiographic studies, ordering and performing treatments and interventions, pulse oximetry, re-evaluation of patient's condition and review of old charts    Medications Ordered in ED Medications  morphine (PF) 4 MG/ML injection 4 mg (4 mg Intravenous Given 02/10/23 2214)  oxyCODONE (Oxy IR/ROXICODONE) immediate release tablet 5 mg (5 mg Oral Given 02/10/23 2032)  sodium chloride 0.9 % bolus 1,000 mL (1,000 mLs Intravenous New Bag/Given 02/10/23 2152)  ondansetron (ZOFRAN) injection 4 mg (4 mg Intravenous Given 02/10/23 2214)    Medical Decision Making:    77 year old female with a substantial amount of chest pain after ground-level fall.  She did not hit her head.  She likely has multiple rib fractures and given hypoxia (saturations 88% on arrival), likely be diffuse rib fractures. Considered intracranial or blunt abdominal trauma as well but these seem inconsistent with the presentation due to duration since injury and lack of focal findings at the sites. Proceed with chest CT as well as screening lab work. She does have an AKI on her renal function likely prerenal based on ratios. Will treat with IV fluid and plan for serial studies with the inpatient team. CT chest did demonstrate multiple rib fractures.  Based on degree of rib fractures I consulted Dr. Bonita Quin of general surgery.  Given duration of time between fall and presentation, surgery feels that patient could be medically managed with surgical consultation.  Consulted medicine for management of AKI and pulmonary toileting in the setting of rib fractures and they were in agreement with admission.  Given degree of rib fractures, patient frequently reassessed stable for admission at this time.  Check for blunt myocardial injury with EKG and troponin both of which were reassuring.  Disposition:   Based on the above findings, I believe this patient is stable for admission.    Patient/family educated about specific findings on our evaluation and explained exact reasons for admission.  Patient/family educated about clinical situation and time was allowed to answer questions.   Admission team communicated with and agreed with need for admission. Patient admitted. Patient  ready to move at this time.     Emergency Department Medication Summary:   Medications  morphine (PF) 4 MG/ML injection 4 mg (4 mg Intravenous Given 02/10/23 2214)  oxyCODONE (Oxy IR/ROXICODONE) immediate release tablet 5 mg (5 mg Oral Given 02/10/23 2032)  sodium chloride 0.9 % bolus 1,000 mL (1,000 mLs Intravenous New  Bag/Given 02/10/23 2152)  ondansetron (ZOFRAN) injection 4 mg (4 mg Intravenous Given 02/10/23 2214)        Clinical Impression:  1. Fall, initial encounter      Admit   Final Clinical Impression(s) / ED Diagnoses Final diagnoses:  Fall, initial encounter    Rx / DC Orders ED Discharge Orders     None         Glyn Ade, MD 02/10/23 2242

## 2023-02-10 NOTE — ED Notes (Signed)
[facility] called for transport. 

## 2023-02-10 NOTE — Progress Notes (Signed)
Plan of Care Note for accepted transfer   Patient: Kerry Powell MRN: [id number]   DOA: 02/10/2023  Facility requesting transfer: [facility]   Requesting Provider: Dr. Doran Durand   Reason for transfer: Rib fractures   Facility course: 77 yr old female with HTN, CKD 3B, CAD, HFpEF, and COPD who presents with rib pain and shortness of breath after mechanical fall 2 days ago.  She is on room air in the ED where workup is most notable for nondisplaced fractures of right anterior 5th through 8th ribs and minimally displaced fractures of left 5th through 7th ribs on CT.   Trauma surgery (Dr. Bedelia Person) will consult.  Patient was treated with oxycodone and 1 L of normal saline.  Plan of care: The patient is accepted for admission to Telemetry unit, at [facility].   Author: Briscoe Deutscher, MD 02/10/2023  Check [email or web address] for on-call coverage.  Nursing staff, Please call [facility] Admits & Consults System-Wide number on Amion as soon as patient's arrival, so appropriate admitting provider can evaluate the pt.

## 2023-02-11 ENCOUNTER — Inpatient Hospital Stay (HOSPITAL_COMMUNITY): Payer: Medicare Other

## 2023-02-11 ENCOUNTER — Encounter (HOSPITAL_COMMUNITY): Payer: Self-pay | Admitting: Family Medicine

## 2023-02-11 DIAGNOSIS — N1832 Chronic kidney disease, stage 3b: Secondary | ICD-10-CM | POA: Diagnosis present

## 2023-02-11 DIAGNOSIS — W19XXXA Unspecified fall, initial encounter: Secondary | ICD-10-CM | POA: Diagnosis not present

## 2023-02-11 DIAGNOSIS — S2243XA Multiple fractures of ribs, bilateral, initial encounter for closed fracture: Secondary | ICD-10-CM | POA: Diagnosis not present

## 2023-02-11 DIAGNOSIS — D72829 Elevated white blood cell count, unspecified: Secondary | ICD-10-CM | POA: Diagnosis present

## 2023-02-11 DIAGNOSIS — I130 Hypertensive heart and chronic kidney disease with heart failure and stage 1 through stage 4 chronic kidney disease, or unspecified chronic kidney disease: Secondary | ICD-10-CM | POA: Diagnosis not present

## 2023-02-11 DIAGNOSIS — Z8709 Personal history of other diseases of the respiratory system: Secondary | ICD-10-CM

## 2023-02-11 DIAGNOSIS — I5032 Chronic diastolic (congestive) heart failure: Secondary | ICD-10-CM | POA: Diagnosis not present

## 2023-02-11 DIAGNOSIS — E119 Type 2 diabetes mellitus without complications: Secondary | ICD-10-CM

## 2023-02-11 DIAGNOSIS — F319 Bipolar disorder, unspecified: Secondary | ICD-10-CM | POA: Diagnosis not present

## 2023-02-11 DIAGNOSIS — N184 Chronic kidney disease, stage 4 (severe): Secondary | ICD-10-CM | POA: Diagnosis present

## 2023-02-11 DIAGNOSIS — E782 Mixed hyperlipidemia: Secondary | ICD-10-CM

## 2023-02-11 DIAGNOSIS — I10 Essential (primary) hypertension: Secondary | ICD-10-CM

## 2023-02-11 LAB — BASIC METABOLIC PANEL
Anion gap: 5 (ref 5–15)
BUN: 35 mg/dL — ABNORMAL HIGH (ref 8–23)
CO2: 24 mmol/L (ref 22–32)
Calcium: 8.3 mg/dL — ABNORMAL LOW (ref 8.9–10.3)
Chloride: 105 mmol/L (ref 98–111)
Creatinine, Ser: 2.15 mg/dL — ABNORMAL HIGH (ref 0.44–1.00)
GFR, Estimated: 23 mL/min — ABNORMAL LOW (ref >60)
Glucose, Bld: 190 mg/dL — ABNORMAL HIGH (ref 70–99)
Potassium: 4.6 mmol/L (ref 3.5–5.1)
Sodium: 134 mmol/L — ABNORMAL LOW (ref 135–145)

## 2023-02-11 LAB — URINALYSIS, COMPLETE (UACMP) WITH MICROSCOPIC
Bilirubin Urine: NEGATIVE
Glucose, UA: >=500 mg/dL — AB
Hgb urine dipstick: NEGATIVE
Ketones, ur: NEGATIVE mg/dL
Nitrite: NEGATIVE
Protein, ur: NEGATIVE mg/dL
Specific Gravity, Urine: 1.013 (ref 1.005–1.030)
pH: 5 (ref 5.0–8.0)

## 2023-02-11 LAB — COMPREHENSIVE METABOLIC PANEL
ALT: 20 U/L (ref 0–44)
AST: 17 U/L (ref 15–41)
Albumin: 3.6 g/dL (ref 3.5–5.0)
Alkaline Phosphatase: 86 U/L (ref 38–126)
Anion gap: 8 (ref 5–15)
BUN: 36 mg/dL — ABNORMAL HIGH (ref 8–23)
CO2: 22 mmol/L (ref 22–32)
Calcium: 8.5 mg/dL — ABNORMAL LOW (ref 8.9–10.3)
Chloride: 106 mmol/L (ref 98–111)
Creatinine, Ser: 2.12 mg/dL — ABNORMAL HIGH (ref 0.44–1.00)
GFR, Estimated: 24 mL/min — ABNORMAL LOW (ref >60)
Glucose, Bld: 149 mg/dL — ABNORMAL HIGH (ref 70–99)
Potassium: 4.9 mmol/L (ref 3.5–5.1)
Sodium: 136 mmol/L (ref 135–145)
Total Bilirubin: 0.7 mg/dL (ref <1.2)
Total Protein: 6.4 g/dL — ABNORMAL LOW (ref 6.5–8.1)

## 2023-02-11 LAB — GLUCOSE, CAPILLARY
Glucose-Capillary: 126 mg/dL — ABNORMAL HIGH (ref 70–99)
Glucose-Capillary: 170 mg/dL — ABNORMAL HIGH (ref 70–99)
Glucose-Capillary: 141 mg/dL — ABNORMAL HIGH (ref 70–99)
Glucose-Capillary: 152 mg/dL — ABNORMAL HIGH (ref 70–99)
Glucose-Capillary: 145 mg/dL — ABNORMAL HIGH (ref 70–99)

## 2023-02-11 LAB — CBC WITH DIFFERENTIAL/PLATELET
Abs Immature Granulocytes: 0.05 10*3/uL (ref 0.00–0.07)
Basophils Absolute: 0 10*3/uL (ref 0.0–0.1)
Basophils Relative: 0 %
Eosinophils Absolute: 0.2 10*3/uL (ref 0.0–0.5)
Eosinophils Relative: 2 %
HCT: 43.9 % (ref 36.0–46.0)
Hemoglobin: 14 g/dL (ref 12.0–15.0)
Immature Granulocytes: 0 %
Lymphocytes Relative: 18 %
Lymphs Abs: 2.2 10*3/uL (ref 0.7–4.0)
MCH: 31.5 pg (ref 26.0–34.0)
MCHC: 31.9 g/dL (ref 30.0–36.0)
MCV: 98.9 fL (ref 80.0–100.0)
Monocytes Absolute: 1 10*3/uL (ref 0.1–1.0)
Monocytes Relative: 8 %
Neutro Abs: 9.1 10*3/uL — ABNORMAL HIGH (ref 1.7–7.7)
Neutrophils Relative %: 72 %
Platelets: 253 10*3/uL (ref 150–400)
RBC: 4.44 MIL/uL (ref 3.87–5.11)
RDW: 14.3 % (ref 11.5–15.5)
WBC: 12.5 10*3/uL — ABNORMAL HIGH (ref 4.0–10.5)
nRBC: 0 % (ref 0.0–0.2)

## 2023-02-11 LAB — MAGNESIUM: Magnesium: 2 mg/dL (ref 1.7–2.4)

## 2023-02-11 LAB — PHOSPHORUS: Phosphorus: 4.2 mg/dL (ref 2.5–4.6)

## 2023-02-11 MED ORDER — INSULIN ASPART 100 UNIT/ML IJ SOLN
0.0000 [IU] | Freq: Three times a day (TID) | INTRAMUSCULAR | Status: DC
  Administered 2023-02-11: 1 [IU] via SUBCUTANEOUS
  Administered 2023-02-11: 2 [IU] via SUBCUTANEOUS
  Administered 2023-02-12: 1 [IU] via SUBCUTANEOUS

## 2023-02-11 MED ORDER — CALCIUM CARBONATE ANTACID 500 MG PO CHEW
200.0000 mg | CHEWABLE_TABLET | Freq: Once | ORAL | Status: AC
  Administered 2023-02-12: 200 mg via ORAL
  Filled 2023-02-11: qty 1

## 2023-02-11 MED ORDER — OXYCODONE HCL 5 MG PO TABS
2.5000 mg | ORAL_TABLET | ORAL | Status: DC | PRN
  Administered 2023-02-11: 5 mg via ORAL
  Filled 2023-02-11: qty 1

## 2023-02-11 MED ORDER — OXYCODONE HCL 5 MG PO TABS: 5.0000 mg | ORAL_TABLET | ORAL | Status: DC | PRN

## 2023-02-11 MED ORDER — PANTOPRAZOLE SODIUM 40 MG PO TBEC
40.0000 mg | DELAYED_RELEASE_TABLET | Freq: Two times a day (BID) | ORAL | Status: DC
  Administered 2023-02-11 – 2023-02-12 (×3): 40 mg via ORAL
  Filled 2023-02-11 (×3): qty 1

## 2023-02-11 MED ORDER — ACETAMINOPHEN 500 MG PO TABS
1000.0000 mg | ORAL_TABLET | Freq: Four times a day (QID) | ORAL | Status: DC
  Administered 2023-02-11: 1000 mg via ORAL
  Filled 2023-02-11: qty 2

## 2023-02-11 MED ORDER — LORATADINE 10 MG PO TABS
5.0000 mg | ORAL_TABLET | Freq: Every day | ORAL | Status: DC
  Administered 2023-02-11 – 2023-02-12 (×2): 5 mg via ORAL
  Filled 2023-02-11 (×2): qty 1

## 2023-02-11 MED ORDER — ALBUTEROL SULFATE (2.5 MG/3ML) 0.083% IN NEBU: 2.5000 mg | INHALATION_SOLUTION | RESPIRATORY_TRACT | Status: DC | PRN

## 2023-02-11 MED ORDER — HYDRALAZINE HCL 25 MG PO TABS: 25.0000 mg | ORAL_TABLET | Freq: Four times a day (QID) | ORAL | Status: DC | PRN

## 2023-02-11 MED ORDER — FUROSEMIDE 20 MG PO TABS: 20.0000 mg | ORAL_TABLET | Freq: Every day | ORAL | Status: DC

## 2023-02-11 MED ORDER — FLUTICASONE PROPIONATE 50 MCG/ACT NA SUSP
2.0000 | Freq: Every day | NASAL | Status: DC
  Administered 2023-02-11 – 2023-02-12 (×2): 2 via NASAL
  Filled 2023-02-11: qty 16

## 2023-02-11 MED ORDER — ENOXAPARIN SODIUM 30 MG/0.3ML IJ SOSY
30.0000 mg | PREFILLED_SYRINGE | Freq: Two times a day (BID) | INTRAMUSCULAR | Status: DC
  Administered 2023-02-11 – 2023-02-12 (×3): 30 mg via SUBCUTANEOUS
  Filled 2023-02-11 (×3): qty 0.3

## 2023-02-11 MED ORDER — METOPROLOL TARTRATE 25 MG PO TABS
25.0000 mg | ORAL_TABLET | Freq: Two times a day (BID) | ORAL | Status: DC
Start: 2023-02-11 — End: 2023-02-12
  Administered 2023-02-11 – 2023-02-12 (×3): 25 mg via ORAL
  Filled 2023-02-11 (×3): qty 1

## 2023-02-11 MED ORDER — HYDROCODONE-ACETAMINOPHEN 5-325 MG PO TABS
1.0000 | ORAL_TABLET | ORAL | Status: DC | PRN
  Administered 2023-02-11: 1 via ORAL
  Filled 2023-02-11: qty 1

## 2023-02-11 MED ORDER — INSULIN ASPART 100 UNIT/ML IJ SOLN: 0.0000 [IU] | Freq: Three times a day (TID) | INTRAMUSCULAR | Status: DC

## 2023-02-11 MED ORDER — ROSUVASTATIN CALCIUM 20 MG PO TABS
40.0000 mg | ORAL_TABLET | Freq: Every day | ORAL | Status: DC
  Administered 2023-02-11 – 2023-02-12 (×2): 40 mg via ORAL
  Filled 2023-02-11 (×2): qty 2

## 2023-02-11 MED ORDER — INSULIN ASPART 100 UNIT/ML IJ SOLN: 3.0000 [IU] | Freq: Three times a day (TID) | INTRAMUSCULAR | Status: DC

## 2023-02-11 MED ORDER — DULOXETINE HCL 30 MG PO CPEP
30.0000 mg | ORAL_CAPSULE | Freq: Every day | ORAL | Status: DC
  Administered 2023-02-11 – 2023-02-12 (×2): 30 mg via ORAL
  Filled 2023-02-11 (×2): qty 1

## 2023-02-11 MED ORDER — FLUTICASONE PROPIONATE 50 MCG/ACT NA SUSP: 1.0000 | Freq: Every day | NASAL | Status: DC | PRN

## 2023-02-11 MED ORDER — HYDROCODONE-ACETAMINOPHEN 7.5-325 MG PO TABS: 1.0000 | ORAL_TABLET | Freq: Four times a day (QID) | ORAL | Status: DC | PRN

## 2023-02-11 MED ORDER — DOCUSATE SODIUM 100 MG PO CAPS
100.0000 mg | ORAL_CAPSULE | Freq: Two times a day (BID) | ORAL | Status: DC
  Administered 2023-02-11 – 2023-02-12 (×3): 100 mg via ORAL
  Filled 2023-02-11 (×3): qty 1

## 2023-02-11 MED ORDER — METHOCARBAMOL 500 MG PO TABS
1000.0000 mg | ORAL_TABLET | Freq: Three times a day (TID) | ORAL | Status: DC
  Administered 2023-02-11 – 2023-02-12 (×5): 1000 mg via ORAL
  Filled 2023-02-11 (×5): qty 2

## 2023-02-11 MED ORDER — HYDROCODONE-ACETAMINOPHEN 5-325 MG PO TABS
1.0000 | ORAL_TABLET | Freq: Four times a day (QID) | ORAL | Status: DC | PRN
  Administered 2023-02-11 – 2023-02-12 (×2): 1 via ORAL
  Filled 2023-02-11 (×2): qty 1

## 2023-02-11 MED ORDER — ACETAMINOPHEN 325 MG PO TABS: 650.0000 mg | ORAL_TABLET | Freq: Four times a day (QID) | ORAL | Status: DC

## 2023-02-11 MED ORDER — MORPHINE SULFATE (PF) 2 MG/ML IV SOLN
1.0000 mg | INTRAVENOUS | Status: DC | PRN
  Administered 2023-02-11: 2 mg via INTRAVENOUS
  Filled 2023-02-11: qty 1

## 2023-02-11 MED ORDER — ALUM & MAG HYDROXIDE-SIMETH 200-200-20 MG/5ML PO SUSP
30.0000 mL | Freq: Four times a day (QID) | ORAL | Status: DC | PRN
  Filled 2023-02-11: qty 30

## 2023-02-11 MED ORDER — LIDOCAINE 5 % EX PTCH
2.0000 | MEDICATED_PATCH | CUTANEOUS | Status: DC
  Administered 2023-02-11 – 2023-02-12 (×2): 2 via TRANSDERMAL
  Filled 2023-02-11 (×2): qty 2

## 2023-02-11 MED ORDER — GUAIFENESIN ER 600 MG PO TB12
600.0000 mg | ORAL_TABLET | Freq: Two times a day (BID) | ORAL | Status: DC
  Administered 2023-02-11 – 2023-02-12 (×3): 600 mg via ORAL
  Filled 2023-02-11 (×3): qty 1

## 2023-02-11 MED ORDER — MELOXICAM 7.5 MG PO TABS
15.0000 mg | ORAL_TABLET | Freq: Every day | ORAL | Status: DC
  Filled 2023-02-11: qty 2

## 2023-02-11 MED ORDER — ACETAMINOPHEN 500 MG PO TABS: 1000.0000 mg | ORAL_TABLET | Freq: Four times a day (QID) | ORAL | Status: DC

## 2023-02-11 MED ORDER — AMLODIPINE BESYLATE 5 MG PO TABS
5.0000 mg | ORAL_TABLET | Freq: Every day | ORAL | Status: DC
  Administered 2023-02-11 – 2023-02-12 (×2): 5 mg via ORAL
  Filled 2023-02-11 (×2): qty 1

## 2023-02-11 MED ORDER — INSULIN ASPART 100 UNIT/ML IJ SOLN
3.0000 [IU] | Freq: Three times a day (TID) | INTRAMUSCULAR | Status: DC
  Administered 2023-02-11 – 2023-02-12 (×4): 3 [IU] via SUBCUTANEOUS

## 2023-02-11 MED ORDER — MONTELUKAST SODIUM 10 MG PO TABS
10.0000 mg | ORAL_TABLET | Freq: Every day | ORAL | Status: DC
  Administered 2023-02-11: 10 mg via ORAL
  Filled 2023-02-11: qty 1

## 2023-02-11 MED ORDER — SALINE SPRAY 0.65 % NA SOLN
1.0000 | NASAL | Status: DC | PRN
  Administered 2023-02-11: 1 via NASAL
  Filled 2023-02-11: qty 44

## 2023-02-11 MED ORDER — ACETAMINOPHEN 325 MG PO TABS
650.0000 mg | ORAL_TABLET | Freq: Four times a day (QID) | ORAL | Status: DC
  Administered 2023-02-11 – 2023-02-12 (×4): 650 mg via ORAL
  Filled 2023-02-11 (×4): qty 2

## 2023-02-11 MED ORDER — POLYETHYLENE GLYCOL 3350 17 G PO PACK
17.0000 g | PACK | Freq: Two times a day (BID) | ORAL | Status: DC
  Filled 2023-02-11 (×3): qty 1

## 2023-02-11 MED ORDER — ALLOPURINOL 300 MG PO TABS
300.0000 mg | ORAL_TABLET | Freq: Every day | ORAL | Status: DC
  Administered 2023-02-11 – 2023-02-12 (×2): 300 mg via ORAL
  Filled 2023-02-11 (×2): qty 1

## 2023-02-11 MED ORDER — CLOPIDOGREL BISULFATE 75 MG PO TABS
75.0000 mg | ORAL_TABLET | Freq: Every day | ORAL | Status: DC
  Administered 2023-02-11 – 2023-02-12 (×2): 75 mg via ORAL
  Filled 2023-02-11 (×2): qty 1

## 2023-02-11 MED ORDER — LIDOCAINE 5 % EX PTCH: 1.0000 | MEDICATED_PATCH | CUTANEOUS | Status: DC

## 2023-02-11 MED ORDER — POLYETHYLENE GLYCOL 3350 17 G PO PACK: 17.0000 g | PACK | Freq: Every day | ORAL | Status: DC | PRN

## 2023-02-11 NOTE — Evaluation (Signed)
Occupational Therapy Evaluation Patient Details Name: Kerry Powell MRN: [id number] DOB: [birth year] Today's Date: 02/11/2023   History of Present Illness Kerry Powell is a 77 yo female who presented after fall with immediate chest pain. Found to have B rib fx L5-7, R5-8. PMHx: COPD, stage IIIb CKD with baseline creatinine of 1.5-2.0, type 2 diabetes mellitus, essential pretension, chronic diastolic heart failure   Clinical Impression   Kerry Powell was evaluated s/p the above admission list. She is indep and cares for her husband at baseline. Upon evaluation the pt was limited by rib pain and limited activity tolerance. Overall she needed generalized superivsion A for mobility with RW, pt denied increased pain with BUE use on RW. Due to the deficits listed below the pt also needs up to CGA for ADLs and increased time for pain management. Pt will benefit from continued acute OT services and discharge home with support from family as needed.         If plan is discharge home, recommend the following: Assistance with cooking/housework;Assist for transportation    Functional Status Assessment  Patient has had a recent decline in their functional status and demonstrates the ability to make significant improvements in function in a reasonable and predictable amount of time.  Equipment Recommendations  Other (comment) (RW)       Precautions / Restrictions Precautions Precautions: Fall Restrictions Weight Bearing Restrictions Per Provider Order: No      Mobility Bed Mobility               General bed mobility comments: sittign EOB upon arrival. Pt reports bed mobility is painful    Transfers Overall transfer level: Needs assistance Equipment used: Rolling walker (2 wheels) Transfers: Sit to/from Stand Sit to Stand: Supervision                  Balance Overall balance assessment: Mild deficits observed, not formally tested             ADL either performed or assessed  with clinical judgement   ADL Overall ADL's : Needs assistance/impaired Eating/Feeding: Independent   Grooming: Supervision/safety   Upper Body Bathing: Set up;Sitting   Lower Body Bathing: Contact guard assist;Sit to/from stand   Upper Body Dressing : Set up;Sitting   Lower Body Dressing: Contact guard assist;Sit to/from stand   Toilet Transfer: Supervision/safety;Ambulation;Regular Toilet;Rolling walker (2 wheels)   Toileting- Clothing Manipulation and Hygiene: Supervision/safety;Sitting/lateral lean       Functional mobility during ADLs: Supervision/safety;Rolling walker (2 wheels) General ADL Comments: CGA fro safety, increased time for pain management     Vision Baseline Vision/History: 0 No visual deficits Vision Assessment?: No apparent visual deficits     Perception Perception: Within Functional Limits       Praxis Praxis: WFL         Extremity/Trunk Assessment Upper Extremity Assessment Upper Extremity Assessment: Overall WFL for tasks assessed (limited due to exacerbation of rib pain with BUE movement. She is able to use BUEs functionally for ADLs and mobility)   Lower Extremity Assessment Lower Extremity Assessment: Defer to PT evaluation   Cervical / Trunk Assessment Cervical / Trunk Assessment: Other exceptions Cervical / Trunk Exceptions: bilat rib fxs   Communication Communication Communication: No apparent difficulties      General Comments  VSS on RA, pt demonstrated good use of IS and flutter valuve            Home Living Family/patient expects to be discharged to:: Private residence Living  Arrangements: Spouse/significant other;Children Available Help at Discharge: Family;Available 24 hours/day (Son is there a majority of the time, husband is there all the time.) Type of Home: House Home Access: Stairs to enter;Ramped entrance Entrance Stairs-Number of Steps: 3   Home Layout: Two level;Able to live on main level with  bedroom/bathroom     Bathroom Shower/Tub: Walk-in shower;Door   Bathroom Toilet: Handicapped height     Home Equipment: Grab bars - tub/shower;Shower Counsellor (2 wheels);Cane - single point   Additional Comments: Most of the DME is husbands but pt can use it if needed      Prior Functioning/Environment Prior Level of Function : Independent/Modified Independent;Driving             Mobility Comments: no AD, denies falls beyond this trip over a box ADLs Comments: Pt is independent but 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 She is at baseline having to assist him with his ADL's.        OT Problem List: Decreased activity tolerance;Pain      OT Treatment/Interventions: Self-care/ADL training;Therapeutic exercise;DME and/or AE instruction;Energy conservation;Therapeutic activities;Balance training;Patient/family education    OT Goals(Current goals can be found in the care plan section) Acute Rehab OT Goals Patient Stated Goal: home OT Goal Formulation: With patient Time For Goal Achievement: 02/25/23 Potential to Achieve Goals: Good ADL Goals Additional ADL Goal #1: pt will complete all BADLs independently Additional ADL Goal #2: Pt will indep verbalize at least 3 fall prevention strategies to apply in the home setting  OT Frequency: Min 1X/week    Co-evaluation PT/OT/SLP Co-Evaluation/Treatment: Yes Reason for Co-Treatment: Complexity of the patient's impairments (multi-system involvement)   OT goals addressed during session: ADL's and self-care      AM-PAC OT "6 Clicks" Daily Activity     Outcome Measure Help from another person eating meals?: None Help from another person taking care of personal grooming?: A Little Help from another person toileting, which includes using toliet, bedpan, or urinal?: A Little Help from another person bathing (including washing, rinsing, drying)?: A Little Help from another person to put on and  taking off regular upper body clothing?: A Little Help from another person to put on and taking off regular lower body clothing?: A Little 6 Click Score: 19   End of Session Equipment Utilized During Treatment: Rolling walker (2 wheels) Nurse Communication: Mobility status  Activity Tolerance: Patient tolerated treatment well Patient left: in chair;with call bell/phone within reach;with chair alarm set  OT Visit Diagnosis: Unsteadiness on feet (R26.81);Other abnormalities of gait and mobility (R26.89);Muscle weakness (generalized) (M62.81);History of falling (Z91.81);Pain                Time: 1030-1052 OT Time Calculation (min): 22 min Charges:  OT General Charges $OT Visit: 1 Visit OT Evaluation $OT Eval Moderate Complexity: 1 Mod  .sam   Lahoma Constantin D Causey 02/11/2023, 11:09 AM

## 2023-02-11 NOTE — Care Management CC44 (Signed)
Condition Code 44 Documentation Completed  Patient Details  Name: NOSHIN DEGRAZIA MRN: [id number] Date of Birth: [birth year]   Condition Code 44 given:  Yes Patient signature on Condition Code 44 notice:  Yes/ verbal consent given by pt for NCM to sign for her. Documentation of 2 MD's agreement:  Yes Code 44 added to claim:  Yes    Epifanio Lesches, RN 02/11/2023, 3:45 PM

## 2023-02-11 NOTE — Progress Notes (Signed)
Transition of Care [facility] - CAGE-AID Screening   Patient Details  Name: Kerry Powell MRN: [id number] Date of Birth: 02-22-1946  Transition of Care [facility]) CM/SW Contact:    Katha Hamming, RN Phone Number: 02/11/2023, 4:42 AM    CAGE-AID Screening:    Have You Ever Felt You Ought to Cut Down on Your Drinking or Drug Use?: No Have People Annoyed You By [employer] Your Drinking Or Drug Use?: No Have You Felt Bad Or Guilty About Your Drinking Or Drug Use?: No Have You Ever Had a Drink or Used Drugs First Thing In The Morning to Steady Your Nerves or to Get Rid of a Hangover?: No CAGE-AID Score: 0  Substance Abuse Education Offered: No

## 2023-02-11 NOTE — Progress Notes (Addendum)
Patient ID: Kerry Powell, female   DOB: 05/30/1945, 77 y.o.   MRN: [id number] [facility] Surgery Progress Note     Subjective: CC-  Sitting up in the edge of bed. Having a difficult time getting comfortable in bed due to rib fractures. Pain is worse with movement and deep inspiration. Feels like she has some phlegm she cannot cough up. Feels like her voice is more hoarse since falling. Required IV pain medication over night. Denies noting any other new injuries.  Objective: Vital signs in last 24 hours: Temp:  [97.5 F (36.4 C)-98.2 F (36.8 C)] 98.2 F (36.8 C) (12/16 0731) Pulse Rate:  [69-100] 73 (12/16 0731) Resp:  [15-24] 16 (12/16 0731) BP: (101-134)/(44-90) 101/62 (12/16 0731) SpO2:  [89 %-96 %] 89 % (12/16 0731) Weight:  [102.5 kg-105.2 kg] 105.2 kg (12/16 0019) Last BM Date : 02/10/23  Intake/Output from previous day: 12/15 0701 - 12/16 0700 In: 240 [P.O.:240] Out: -  Intake/Output this shift: No intake/output data recorded.  PE: Gen:  Alert, NAD Card:  RRR Pulm:  CTAB, no W/R/R, rate and effort normal on room air. Pulling 1250 on IS Abd: Soft, NT/ND Ext:  calves soft and nontender, trace BLE edema Psych: A&Ox4  Skin: no rashes noted, warm and dry  Lab Results:  Recent Labs    02/10/23 2007 02/11/23 0353  WBC 11.8* 12.5*  HGB 14.6 14.0  HCT 44.9 43.9  PLT 279 253   BMET Recent Labs    02/10/23 2007 02/11/23 0353  NA 134* 136  K 4.9 4.9  CL 105 106  CO2 20* 22  GLUCOSE 194* 149*  BUN 38* 36*  CREATININE 2.08* 2.12*  CALCIUM 8.5* 8.5*   PT/INR No results for input(s): "LABPROT", "INR" in the last 72 hours. CMP     Component Value Date/Time   NA 136 02/11/2023 0353   NA 137 06/16/2019 1417   K 4.9 02/11/2023 0353   CL 106 02/11/2023 0353   CO2 22 02/11/2023 0353   GLUCOSE 149 (H) 02/11/2023 0353   BUN 36 (H) 02/11/2023 0353   BUN 18 06/16/2019 1417   CREATININE 2.12 (H) 02/11/2023 0353   CREATININE 1.44 (H) 09/30/2019 1532    CALCIUM 8.5 (L) 02/11/2023 0353   PROT 6.4 (L) 02/11/2023 0353   ALBUMIN 3.6 02/11/2023 0353   AST 17 02/11/2023 0353   ALT 20 02/11/2023 0353   ALKPHOS 86 02/11/2023 0353   BILITOT 0.7 02/11/2023 0353   GFRNONAA 24 (L) 02/11/2023 0353   GFRAA 39 (L) 07/02/2019 1000   Lipase  No results found for: "LIPASE"     Studies/Results: CT Chest Wo Contrast Result Date: 02/10/2023 CLINICAL DATA:  Blunt chest trauma after fall 2 days ago. Bilateral rib pain. Bruising to left breast. Shortness of breath since fall. EXAM: CT CHEST WITHOUT CONTRAST TECHNIQUE: Multidetector CT imaging of the chest was performed following the standard protocol without IV contrast. RADIATION DOSE REDUCTION: This exam was performed according to the departmental dose-optimization program which includes automated exposure control, adjustment of the mA and/or kV according to patient size and/or use of iterative reconstruction technique. COMPARISON:  09/26/2020 FINDINGS: Cardiovascular: Coronary artery and aortic atherosclerotic calcification. No pericardial effusion. Mediastinum/Nodes: Trachea and esophagus are unremarkable. No thoracic adenopathy. Lungs/Pleura: Emphysema. Biapical pleural-parenchymal scarring. No focal consolidation, pleural effusion, or pneumothorax. Upper Abdomen: No acute abnormality. Musculoskeletal: Acute nondisplaced buckle fractures of the anterior right 5th-8th ribs. Acute minimally displaced fractures of the left anterior 5th-7th ribs. IMPRESSION: Nondisplaced  fractures of the right anterior 5th-8th ribs and minimally displaced fractures of the left 5th-7th ribs. Aortic Atherosclerosis (ICD10-I70.0) and Emphysema (ICD10-J43.9). Electronically Signed   By: Minerva Fester M.D.   On: 02/10/2023 21:02    Anti-infectives: Anti-infectives (From admission, onward)    None        Assessment/Plan  Mechanical fall   Bilateral rib fx L5-7, R5-8 - multimodal pain control, pulm toilet, IS. Repeat CXR this  AM. Schedule mucinex.  FEN - regular diet DVT - SCDs, LMWH, plavix Dispo - Med-surg. Therapies.     LOS: 1 day    Franne Forts, [facility] Surgery 02/11/2023, 8:31 AM Please see Amion for pager number during day hours 7:00am-4:30pm

## 2023-02-11 NOTE — Plan of Care (Signed)
  Problem: Clinical Measurements: Goal: Ability to maintain clinical measurements within normal limits will improve Outcome: Progressing Goal: Will remain free from infection Outcome: Progressing   Problem: Activity: Goal: Risk for activity intolerance will decrease Outcome: Progressing   Problem: Nutrition: Goal: Adequate nutrition will be maintained Outcome: Progressing   Problem: Pain Management: Goal: General experience of comfort will improve Outcome: Progressing   Problem: Safety: Goal: Ability to remain free from injury will improve Outcome: Progressing

## 2023-02-11 NOTE — Plan of Care (Signed)
  Problem: Activity: Goal: Risk for activity intolerance will decrease Outcome: Progressing   Problem: Coping: Goal: Level of anxiety will decrease Outcome: Progressing   Problem: Pain Management: Goal: General experience of comfort will improve Outcome: Progressing   Problem: Safety: Goal: Ability to remain free from injury will improve Outcome: Progressing

## 2023-02-11 NOTE — Progress Notes (Signed)
[facility] PROGRESS NOTE    Progress Note  Kerry Powell  JYN:[id number] DOB: February 16, 1946 DOA: 02/10/2023 PCP: Pincus Sanes, MD     Brief Narrative:   Kerry Powell is an 77 y.o. female past medical history of COPD, chronic kidney disease stage IIIb with a baseline creatinine of around 1.5-2, diabetes mellitus type 2, essential hypertension, chronic diastolic dysfunction transferred from [facility] for multiple bilateral rib fractures after tripped from ground-level at home on 02/08/2023.  Assessment/Plan:   Multiple consecutive bilateral rib fractures: Secondary to mechanical fall from ground-level.  CT of the chest showed nondisplaced rib fracture without any evidence of pneumothorax or acute cardiopulmonary processes. Started on incentive spirometry, flutter valve and pulmonary toileting. Trauma surgery was consulted recommended pain control and pulmonary toileting. Consult PT OT.  Leukocytosis: Likely reactive due to trauma has remained afebrile denies any fever chills, shortness of breath or cough.  Bipolar disorder: Continue home dose of Cymbalta.  COPD: Continue Singulair and inhalers. Will do aggressive incentive spirometry and flutter valve.  Acute on Chronic kidney see stage IIIb: With a baseline creatinine 1.5-1.7 on admission 2.1. Likely slightly prerenal due to fracture, in the setting of NSAIDs and ARB use, will allow oral hydration recheck in the morning. Hold meloxicam, Lasix and Micardis. Recheck basic metabolic panel in the morning  Diabetes mellitus type 2 without insulin use: Last A1c of 7.0. Hold oral hypoglycemic agents. Continue sliding scale insulin.  Essential hypertension Blood pressure is controlled. Continue to hold Lasix and ER be. Hydralazine orally as needed for blood pressure greater than 180.  Dyslipidemia: Continue statins.  Chronic diastolic dysfunction: There is a mild increase in her renal dysfunction. Hold  Lasix gentle IV fluids recheck basic metabolic panel tomorrow morning.  DVT prophylaxis: lovenox Family Communication:none Status is: Inpatient Remains inpatient appropriate because: Acute kidney injury    Code Status:     Code Status Orders  (From admission, onward)           Start     Ordered   02/11/23 0042  Do not attempt resuscitation (DNR)- Limited -Do Not Intubate (DNI)  (Code Status)  Continuous       Question Answer Comment  If pulseless and not breathing No CPR or chest compressions.   In Pre-Arrest Conditions (Patient Is Breathing and Has A Pulse) Do not intubate. Provide all appropriate non-invasive medical interventions. Avoid ICU transfer unless indicated or required.   Consent: Discussion documented in EHR or advanced directives reviewed      02/11/23 0042           Code Status History     Date Active Date Inactive Code Status Order ID Comments User Context   02/10/2023 2355 02/11/2023 0042 Full Code [id number]  Angie Fava, DO Inpatient   09/26/2020 0025 09/30/2020 1709 Full Code [id number]  Anselm Jungling, DO ED      Advance Directive Documentation    Flowsheet Row Most Recent Value  Type of Advance Directive Healthcare Power of Attorney, Living will, Out of facility DNR (pink MOST or yellow form)  Pre-existing out of facility DNR order (yellow form or pink MOST form) --  "MOST" Form in Place? --         IV Access:   Peripheral IV   Procedures and diagnostic studies:   CT Chest Wo Contrast Result Date: 02/10/2023 CLINICAL DATA:  Blunt chest trauma after fall 2 days ago. Bilateral rib pain. Bruising to left breast. Shortness  of breath since fall. EXAM: CT CHEST WITHOUT CONTRAST TECHNIQUE: Multidetector CT imaging of the chest was performed following the standard protocol without IV contrast. RADIATION DOSE REDUCTION: This exam was performed according to the departmental dose-optimization program which includes automated exposure control,  adjustment of the mA and/or kV according to patient size and/or use of iterative reconstruction technique. COMPARISON:  09/26/2020 FINDINGS: Cardiovascular: Coronary artery and aortic atherosclerotic calcification. No pericardial effusion. Mediastinum/Nodes: Trachea and esophagus are unremarkable. No thoracic adenopathy. Lungs/Pleura: Emphysema. Biapical pleural-parenchymal scarring. No focal consolidation, pleural effusion, or pneumothorax. Upper Abdomen: No acute abnormality. Musculoskeletal: Acute nondisplaced buckle fractures of the anterior right 5th-8th ribs. Acute minimally displaced fractures of the left anterior 5th-7th ribs. IMPRESSION: Nondisplaced fractures of the right anterior 5th-8th ribs and minimally displaced fractures of the left 5th-7th ribs. Aortic Atherosclerosis (ICD10-I70.0) and Emphysema (ICD10-J43.9). Electronically Signed   By: Minerva Fester M.D.   On: 02/10/2023 21:02     Medical Consultants:   None.   Subjective:    Kerry Powell relates her pain is controlled.  Objective:    Vitals:   02/10/23 2332 02/11/23 0019 02/11/23 0457 02/11/23 0731  BP: (!) 117/90  (!) 115/45 101/62  Pulse: 74  69 73  Resp:   15 16  Temp: 97.7 F (36.5 C)  97.7 F (36.5 C) 98.2 F (36.8 C)  TempSrc: Oral  Oral   SpO2: 96%  90% (!) 89%  Weight:  105.2 kg    Height:       SpO2: (!) 89 % O2 Flow Rate (L/min): 2 L/min   Intake/Output Summary (Last 24 hours) at 02/11/2023 0820 Last data filed at 02/11/2023 0300 Gross per 24 hour  Intake 240 ml  Output --  Net 240 ml   Filed Weights   02/10/23 1946 02/11/23 0019  Weight: 102.5 kg 105.2 kg    Exam: General exam: In no acute distress. Respiratory system: Good air movement and clear to auscultation. Cardiovascular system: S1 & S2 heard, RRR. No JVD. Gastrointestinal system: Abdomen is nondistended, soft and nontender.  Extremities: No pedal edema. Skin: No rashes, lesions or ulcers Psychiatry: Judgement and insight  appear normal. Mood & affect appropriate.    Data Reviewed:    Labs: Basic Metabolic Panel: Recent Labs  Lab 02/10/23 2007 02/11/23 0353  NA 134* 136  K 4.9 4.9  CL 105 106  CO2 20* 22  GLUCOSE 194* 149*  BUN 38* 36*  CREATININE 2.08* 2.12*  CALCIUM 8.5* 8.5*  MG  --  2.0  PHOS  --  4.2   GFR Estimated Creatinine Clearance: 26.8 mL/min (A) (by C-G formula based on SCr of 2.12 mg/dL (H)). Liver Function Tests: Recent Labs  Lab 02/11/23 0353  AST 17  ALT 20  ALKPHOS 86  BILITOT 0.7  PROT 6.4*  ALBUMIN 3.6   No results for input(s): "LIPASE", "AMYLASE" in the last 168 hours. No results for input(s): "AMMONIA" in the last 168 hours. Coagulation profile No results for input(s): "INR", "PROTIME" in the last 168 hours. COVID-19 Labs  No results for input(s): "DDIMER", "FERRITIN", "LDH", "CRP" in the last 72 hours.  Lab Results  Component Value Date   SARSCOV2NAA POSITIVE (A) 09/25/2020   SARSCOV2NAA NOT DETECTED 03/26/2019   SARSCOV2NAA Not Detected 02/17/2019   SARSCOV2NAA Not Detected 11/11/2018    CBC: Recent Labs  Lab 02/10/23 2007 02/11/23 0353  WBC 11.8* 12.5*  NEUTROABS 9.3* 9.1*  HGB 14.6 14.0  HCT 44.9 43.9  MCV 97.8  98.9  PLT 279 253   Cardiac Enzymes: No results for input(s): "CKTOTAL", "CKMB", "CKMBINDEX", "TROPONINI" in the last 168 hours. BNP (last 3 results) No results for input(s): "PROBNP" in the last 8760 hours. CBG: Recent Labs  Lab 02/11/23 0437 02/11/23 0728  GLUCAP 126* 170*   D-Dimer: No results for input(s): "DDIMER" in the last 72 hours. Hgb A1c: No results for input(s): "HGBA1C" in the last 72 hours. Lipid Profile: No results for input(s): "CHOL", "HDL", "LDLCALC", "TRIG", "CHOLHDL", "LDLDIRECT" in the last 72 hours. Thyroid function studies: No results for input(s): "TSH", "T4TOTAL", "T3FREE", "THYROIDAB" in the last 72 hours.  Invalid input(s): "FREET3" Anemia work up: No results for input(s): "VITAMINB12",  "FOLATE", "FERRITIN", "TIBC", "IRON", "RETICCTPCT" in the last 72 hours. Sepsis Labs: Recent Labs  Lab 02/10/23 2007 02/11/23 0353  WBC 11.8* 12.5*   Microbiology No results found for this or any previous visit (from the past 240 hours).   Medications:    acetaminophen  1,000 mg Oral Q6H   amLODipine  5 mg Oral Daily   clopidogrel  75 mg Oral Daily   DULoxetine  30 mg Oral Daily   enoxaparin (LOVENOX) injection  30 mg Subcutaneous Q12H   furosemide  20 mg Oral Daily   insulin aspart  0-9 Units Subcutaneous TID WC   lidocaine  2 patch Transdermal Q24H   loratadine  5 mg Oral Daily   meloxicam  15 mg Oral Daily   methocarbamol  1,000 mg Oral Q8H   metoprolol tartrate  25 mg Oral BID   montelukast  10 mg Oral QHS   pantoprazole  40 mg Oral BID AC   rosuvastatin  40 mg Oral Daily   Continuous Infusions:    LOS: 1 day   Marinda Elk  [facility]  02/11/2023, 8:20 AM

## 2023-02-11 NOTE — Care Management Obs Status (Signed)
MEDICARE OBSERVATION STATUS NOTIFICATION   Patient Details  Name: TENEIL KNAPPE MRN: [id number] Date of Birth: [birth year]   Medicare Observation Status Notification Given:  Yes    Epifanio Lesches, RN 02/11/2023, 3:44 PM

## 2023-02-11 NOTE — Evaluation (Signed)
Physical Therapy Evaluation Patient Details Name: Kerry Powell MRN: [id number] DOB: 1945/08/12 Today's Date: 02/11/2023  History of Present Illness  Pt is a 77 yo female who presented after fall with immediate chest pain. Found to have B rib fx L5-7, R5-8. PMH significant for COPD, stage IIIb CKD with baseline creatinine of 1.5-2.0, type 2 diabetes mellitus, HTN, chronic diastolic heart failure, Bipolar disorder, dysphagia, CVA, gout, occipital neuralgia, OA, restless leg syndrome, thoracic aortic aneurysm, trigeminal neuralgia R side of face.   Clinical Impression  Pt admitted with above diagnosis. Pt currently with functional limitations due to the deficits listed below (see PT Problem List). At the time of PT eval pt was able to perform transfers and ambulation with gross supervision for safety and RW for support. Pt was educated on incentive spirometer use and flutter valve use, bracing ribs with a pillow for support if pt needs to cough. Although limited due to pain, pt mobilizing well and do not anticipate pt will require follow up PT services at d/c. Pt will benefit from acute skilled PT to increase their independence and safety with mobility to allow discharge.           If plan is discharge home, recommend the following: A little help with walking and/or transfers;Assistance with cooking/housework;Assist for transportation;Help with stairs or ramp for entrance   Can travel by private vehicle        Equipment Recommendations Rolling walker (2 wheels)  Recommendations for Other Services       Functional Status Assessment Patient has had a recent decline in their functional status and demonstrates the ability to make significant improvements in function in a reasonable and predictable amount of time.     Precautions / Restrictions Precautions Precautions: Fall Restrictions Weight Bearing Restrictions Per Provider Order: No      Mobility  Bed Mobility                General bed mobility comments: sitting EOB upon arrival. Pt reports bed mobility is painful    Transfers Overall transfer level: Needs assistance Equipment used: Rolling walker (2 wheels) Transfers: Sit to/from Stand Sit to Stand: Supervision           General transfer comment: Pt was able to power up to full stand without assistance. Supervision for safety and RW for support. Pt moaning in pain but able to work through it to stand.    Ambulation/Gait Ambulation/Gait assistance: Supervision Gait Distance (Feet): 200 Feet Assistive device: Rolling walker (2 wheels) Gait Pattern/deviations: Step-through pattern, Decreased stride length, Trunk flexed Gait velocity: Decreased Gait velocity interpretation: <1.31 ft/sec, indicative of household ambulator   General Gait Details: Pt lightly bracing on to RW for support. She states she does not feel like she can put a lot of weight through the RW due to pain. No overt LOB noted. Mild unsteadiness noted occasionally but not consistently.  Stairs            Wheelchair Mobility     Tilt Bed    Modified Rankin (Stroke Patients Only)       Balance Overall balance assessment: Mild deficits observed, not formally tested                                           Pertinent Vitals/Pain Pain Assessment Pain Assessment: 0-10 Pain Score: 10-Worst pain ever Pain Location: ribs (R  worse than L)    Home Living Family/patient expects to be discharged to:: Private residence Living Arrangements: Spouse/significant other;Children Available Help at Discharge: Family;Available 24 hours/day (Son is there a majority of the time, husband is there all the time.) Type of Home: House Home Access: Stairs to enter;Ramped entrance   Entrance Stairs-Number of Steps: 3   Home Layout: Two level;Able to live on main level with bedroom/bathroom Home Equipment: Grab bars - tub/shower;Shower Counsellor (2 wheels);Cane -  single point Additional Comments: Most of the DME is husbands but pt can use it if needed    Prior Function Prior Level of Function : Independent/Modified Independent;Driving             Mobility Comments: no AD, denies falls beyond this trip over a box ADLs Comments: Pt is independent but 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 At baseline she assists him with his ADL's.     Extremity/Trunk Assessment   Upper Extremity Assessment Upper Extremity Assessment: Defer to OT evaluation    Lower Extremity Assessment Lower Extremity Assessment: Overall WFL for tasks assessed    Cervical / Trunk Assessment Cervical / Trunk Assessment: Other exceptions Cervical / Trunk Exceptions: bilat rib fxs  Communication   Communication Communication: No apparent difficulties  Cognition Arousal: Alert Behavior During Therapy: WFL for tasks assessed/performed Overall Cognitive Status: Within Functional Limits for tasks assessed                                          General Comments General comments (skin integrity, edema, etc.): VSS on RA, pt demonstrated good use of IS and flutter valuve    Exercises     Assessment/Plan    PT Assessment Patient needs continued PT services  PT Problem List Decreased strength;Decreased activity tolerance;Decreased mobility;Decreased balance;Decreased knowledge of use of DME;Decreased safety awareness;Decreased knowledge of precautions;Pain       PT Treatment Interventions DME instruction;Gait training;Functional mobility training;Therapeutic activities;Therapeutic exercise;Balance training;Patient/family education    PT Goals (Current goals can be found in the Care Plan section)  Acute Rehab PT Goals Patient Stated Goal: Be able to take care of her husband again PT Goal Formulation: With patient Time For Goal Achievement: 02/18/23 Potential to Achieve Goals: Good    Frequency Min 1X/week      Co-evaluation PT/OT/SLP Co-Evaluation/Treatment: Yes Reason for Co-Treatment: Complexity of the patient's impairments (multi-system involvement);To address functional/ADL transfers PT goals addressed during session: Mobility/safety with mobility;Balance;Proper use of DME OT goals addressed during session: ADL's and self-care       AM-PAC PT "6 Clicks" Mobility  Outcome Measure Help needed turning from your back to your side while in a flat bed without using bedrails?: A Little Help needed moving from lying on your back to sitting on the side of a flat bed without using bedrails?: A Little Help needed moving to and from a bed to a chair (including a wheelchair)?: A Little Help needed standing up from a chair using your arms (e.g., wheelchair or bedside chair)?: A Little Help needed to walk in hospital room?: A Little Help needed climbing 3-5 steps with a railing? : A Little 6 Click Score: 18    End of Session   Activity Tolerance: Patient tolerated treatment well Patient left: in chair;with call bell/phone within reach;with chair alarm set Nurse Communication: Mobility status PT Visit Diagnosis: Unsteadiness on  feet (R26.81)    Time: 1308-6578 PT Time Calculation (min) (ACUTE ONLY): 24 min   Charges:   PT Evaluation $PT Eval Moderate Complexity: 1 Mod   PT General Charges $$ ACUTE PT VISIT: 1 Visit         Conni Slipper, PT, DPT Acute Rehabilitation Services Secure Chat Preferred Office: [phone]   Marylynn Pearson 02/11/2023, 12:44 PM

## 2023-02-11 NOTE — H&P (Signed)
History and Physical      Kerry Powell UEA:[id number] DOB: 1945-11-08 DOA: 02/10/2023; DOS: 02/11/2023  PCP: Pincus Sanes, MD *** Patient coming from: home ***  I have personally briefly reviewed patient's old medical records in [facility]  Chief Complaint: ***  HPI: Kerry Powell is a 77 y.o. female with medical history significant for *** who is admitted to [facility] on 02/10/2023 with *** after presenting from home*** to [facility] ED complaining of ***.    ***       ***   ED Course:  Vital signs in the ED were notable for the following: ***  Labs were notable for the following: ***  Per my interpretation, EKG in ED demonstrated the following:  ***  Imaging in the ED, per corresponding formal radiology read, was notable for the following:  ***  EDP at [facility] d/w on-call trauma surgeon, Dr. Bedelia Person, who will consult.   While in the ED, the following were administered: ***  Subsequently, the patient was admitted  ***  ***red    Review of Systems: As per HPI otherwise 10 point review of systems negative.   Past Medical History:  Diagnosis Date   Allergy    Anemia    Anxiety    Arthritis    BACK PAIN, CHRONIC 04/04/2007   Qualifier: Diagnosis of  By: Leone Payor MD, Charlyne Quale    BIPOLAR AFFECTIVE DISORDER 04/04/2007   Qualifier: History of  By: Leone Payor MD, Alfonse Ras E    CAD (coronary artery disease), native coronary artery    mild CAD with no obstructive disease by coronary CTA 06/2019   Cataract    removed both eyes   Chronic neck pain 04/30/2015   Cervical spondylosis [facility] Intermittent numbness and tingling in occipital region and headaches S/p 2 occipital nerve blocks - only temp relief no relief with gabapentin, ultram, brace MRI neck - facet arthrosis    CKD (chronic kidney disease) 04/26/2015   COPD (chronic obstructive pulmonary disease) (HCC)    pt denies - told this while was smoking -   Cough 04/26/2015   Depression  07/01/2017   Diabetes (HCC) 04/27/2015   Diagnosed 03/2015    Dizziness 06/10/2017   Dysphagia 04/26/2015   DYSPNEA 12/24/2007   Qualifier: Diagnosis of  By: Galen Manila     Elevated TSH 08/09/2015   GASTRIC ULCER, ACUTE 04/07/2007   Annotation: EGD 04/07/07 Qualifier: Diagnosis of  By: Leone Payor MD, Alfonse Ras E    GASTRITIS 04/07/2007   Annotation: EGD 04/07/07 Qualifier: Diagnosis of  By: Leone Payor MD, Alfonse Ras E    GERD (gastroesophageal reflux disease)    H/O renal cell carcinoma 05/23/2017   Dx 2010, s/p cryoablation Follows with urology - Dr Liliane Shi   HIATAL HERNIA 04/07/2007   Annotation: EGD 04/07/07 Qualifier: Diagnosis of  By: Leone Payor MD, Alfonse Ras E    History of CVA (cerebrovascular accident) 04/26/2015   History of gout 04/26/2015   Hyperlipidemia    Hypertension    Irritable bowel syndrome 04/04/2007   Qualifier: Diagnosis of  By: Leone Payor MD, Charlyne Quale    Kidney tumor (benign)    Nausea and vomiting 09/09/2017   Occipital neuralgia 05/09/2016   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 Dr Leanna Battles in [place]   OSTEOARTHRITIS 04/04/2007   Qualifier: Diagnosis of  By: Leone Payor MD, Charlyne Quale    Personal history of colonic adenoma 06/07/2012   POSTNASAL DRIP SYNDROME 12/29/2007  Qualifier: Diagnosis of  By: Marchelle Gearing MD, [name]     RESTLESS LEG SYNDROME 04/04/2007   Qualifier: Diagnosis of  By: Leone Payor MD, Charlyne Quale    RHINOSINUSITIS, 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   Qualifier: Diagnosis of  By: Leone Payor MD, Charlyne Quale    Sleep difficulties 09/09/2017   Snoring 04/26/2015   Stroke (HCC)    x2 2011   Thoracic aortic aneurysm (HCC) 02/06/2018   Seen on MRI 01/25/18 of thoracic spine ordered by [facility] -- referred to CTS to monitor  -- 3.5 cm   TIA (transient ischemic attack)    TOBACCO ABUSE 12/17/2007   Qualifier: Diagnosis of  By: Marchelle Gearing MD, [name]     Trigeminal neuralgia of right side of face 02/08/2016   URI (upper respiratory infection) 02/08/2016   Vitamin D deficiency 03/12/2018     Past Surgical History:  Procedure Laterality Date   ABDOMINAL HYSTERECTOMY     APPENDECTOMY     CATARACT EXTRACTION     COLONOSCOPY     CYSTOCELE REPAIR     ESOPHAGOGASTRODUODENOSCOPY     KIDNEY SURGERY     benign tumor removal   ORTHOPEDIC SURGERY     Wrist & elbow   POLYPECTOMY     RECTOCELE REPAIR     TONSILECTOMY, ADENOIDECTOMY, BILATERAL MYRINGOTOMY AND TUBES Bilateral 1968   UPPER GASTROINTESTINAL ENDOSCOPY      Social History:  reports that she quit smoking about 13 years ago. Her smoking use included cigarettes. She started smoking about 59 years ago. She has a 46 pack-year smoking history. She has never used smokeless tobacco. She reports that she does not drink alcohol and does not use drugs.   Allergies  Allergen Reactions   Gabapentin Other (See Comments)    Fluid retention    Clindamycin Rash   Sulfa Antibiotics Nausea And Vomiting    Family History  Problem Relation Age of Onset   Congestive Heart Failure Mother    Cancer Mother        ovarian   Heart disease Father    Diabetes Father    Kidney disease Father    Diabetes Sister    Kidney disease Sister    Colon cancer Neg Hx    Colon polyps Neg Hx    Esophageal cancer Neg Hx    Rectal cancer Neg Hx    Stomach cancer Neg Hx     Family history reviewed and not pertinent ***   Prior to Admission medications   Medication Sig Start Date End Date Taking? Authorizing Provider  ACETAMINOPHEN-BUTALBITAL 50-325 MG TABS TAKE 1 TABLET BY MOUTH ONCE DAILY AS NEEDED 02/06/23   Pincus Sanes, MD  albuterol (PROVENTIL) (2.5 MG/3ML) 0.083% nebulizer solution Take 3 mLs (2.5 mg total) by nebulization every 6 (six) hours as needed for wheezing or shortness of breath. 11/06/21   Charlott Holler, MD  allopurinol (ZYLOPRIM) 300 MG tablet Take 1 tablet by mouth once daily 02/01/23   Pincus Sanes, MD  amLODipine (NORVASC) 5 MG tablet TAKE 1 & 1/2 (ONE & ONE-HALF) TABLETS BY MOUTH ONCE DAILY 12/17/22   Pincus Sanes, MD  cetirizine (ZYRTEC ALLERGY) 10 MG tablet 10 mg by oral route. 05/06/19   [provider]  cholecalciferol (VITAMIN D3) 25 MCG (1000 UNIT) tablet Take 2,000 Units by mouth daily.    [provider]  clopidogrel (PLAVIX) 75 MG tablet Take 1 tablet by mouth once daily 11/22/22   Pincus Sanes, MD  cyclobenzaprine (FLEXERIL) 5 MG tablet Take 1 tablet by mouth three times daily as needed for muscle spasm 11/22/22   Pincus Sanes, MD  DULoxetine (CYMBALTA) 30 MG capsule TAKE 1  BY MOUTH ONCE DAILY 02/01/23   Burns, Bobette Mo, MD  FARXIGA 10 MG TABS tablet Take 1 tablet (10 mg total) by mouth daily. Take 10 mg by mouth daily. 08/15/22   Pincus Sanes, MD  ferrous sulfate 325 (65 FE) MG tablet Take 325 mg by mouth as needed.    [provider]  fluticasone (FLONASE) 50 MCG/ACT nasal spray Place 2 sprays into both nostrils daily. Patient taking differently: Place 2 sprays into both nostrils as needed. 11/06/21   Charlott Holler, MD  furosemide (LASIX) 20 MG tablet Take 1 tablet (20 mg total) by mouth daily. 05/15/22   Pincus Sanes, MD  meloxicam (MOBIC) 15 MG tablet Take 15 mg by mouth daily. 09/17/22   [provider]  metoprolol tartrate (LOPRESSOR) 25 MG tablet Take 1 tablet (25 mg total) by mouth 2 (two) times daily. May take extra 25 mg tablet for as needed palpitations 02/22/22 05/23/22  Gaston Islam., NP  montelukast (SINGULAIR) 10 MG tablet TAKE 1 TABLET BY MOUTH ONCE DAILY AT BEDTIME 02/01/23   Pincus Sanes, MD  pantoprazole (PROTONIX) 40 MG tablet Take 1 tablet (40 mg total) by mouth 2 (two) times daily before a meal. 02/01/23   Burns, Bobette Mo, MD  rosuvastatin (CRESTOR) 40 MG tablet Take 1 tablet by mouth once daily 11/05/22   Turner, Cornelious Bryant, MD  Semaglutide (RYBELSUS) 14 MG TABS Take 1 tablet (14 mg total) by mouth daily. 05/15/22   Pincus Sanes, MD  telmisartan (MICARDIS) 80 MG tablet Take 1 tablet (80 mg total) by mouth daily. 05/10/21   Pincus Sanes,  MD     Objective    Physical Exam: Vitals:   02/10/23 1942 02/10/23 1946 02/10/23 2230 02/10/23 2332  BP: 129/84  (!) 134/44 (!) 117/90  Pulse: 100  80 74  Resp: 20  (!) 24   Temp: (!) 97.5 F (36.4 C)   97.7 F (36.5 C)  TempSrc:    Oral  SpO2: 92%  90% 96%  Weight:  102.5 kg    Height:  5\' 5"  (1.651 m)      General: appears to be stated age; alert, oriented Skin: warm, dry, no rash Head:  AT/[place] Mouth:  Oral mucosa membranes appear moist, normal dentition Neck: supple; trachea midline Heart:  RRR; did not appreciate any M/R/G Lungs: CTAB, did not appreciate any wheezes, rales, or rhonchi Abdomen: + BS; soft, ND, NT Vascular: 2+ pedal pulses b/l; 2+ radial pulses b/l Extremities: no peripheral edema, no muscle wasting Neuro: strength and sensation intact in upper and lower extremities b/l ***   *** Neuro: 5/5 strength of the proximal and distal flexors and extensors of the upper and lower extremities bilaterally; sensation intact in upper and lower extremities b/l; cranial nerves II through XII grossly intact; no pronator drift; no evidence suggestive of slurred speech, dysarthria, or facial droop; Normal muscle tone. No tremors.  *** Neuro: In the setting of the patient's current mental status and associated inability to follow instructions, unable to perform full neurologic exam at this time.  As such, assessment of strength, sensation, and cranial nerves is limited at this time. Patient noted to spontaneously move all 4 extremities. No tremors.  ***    Labs on Admission: I  have personally reviewed following labs and imaging studies  CBC: Recent Labs  Lab 02/10/23 2007  WBC 11.8*  NEUTROABS 9.3*  HGB 14.6  HCT 44.9  MCV 97.8  PLT 279   Basic Metabolic Panel: Recent Labs  Lab 02/10/23 2007  NA 134*  K 4.9  CL 105  CO2 20*  GLUCOSE 194*  BUN 38*  CREATININE 2.08*  CALCIUM 8.5*   GFR: Estimated Creatinine Clearance: 26.9 mL/min (A) (by C-G  formula based on SCr of 2.08 mg/dL (H)). Liver Function Tests: No results for input(s): "AST", "ALT", "ALKPHOS", "BILITOT", "PROT", "ALBUMIN" in the last 168 hours. No results for input(s): "LIPASE", "AMYLASE" in the last 168 hours. No results for input(s): "AMMONIA" in the last 168 hours. Coagulation Profile: No results for input(s): "INR", "PROTIME" in the last 168 hours. Cardiac Enzymes: No results for input(s): "CKTOTAL", "CKMB", "CKMBINDEX", "TROPONINI" in the last 168 hours. BNP (last 3 results) No results for input(s): "PROBNP" in the last 8760 hours. HbA1C: No results for input(s): "HGBA1C" in the last 72 hours. CBG: No results for input(s): "GLUCAP" in the last 168 hours. Lipid Profile: No results for input(s): "CHOL", "HDL", "LDLCALC", "TRIG", "CHOLHDL", "LDLDIRECT" in the last 72 hours. Thyroid Function Tests: No results for input(s): "TSH", "T4TOTAL", "FREET4", "T3FREE", "THYROIDAB" in the last 72 hours. Anemia Panel: No results for input(s): "VITAMINB12", "FOLATE", "FERRITIN", "TIBC", "IRON", "RETICCTPCT" in the last 72 hours. Urine analysis:    Component Value Date/Time   COLORURINE YELLOW 05/15/2022 1554   APPEARANCEUR CLEAR 05/15/2022 1554   LABSPEC 1.015 05/15/2022 1554   PHURINE 6.0 05/15/2022 1554   GLUCOSEU >=1000 (A) 05/15/2022 1554   HGBUR NEGATIVE 05/15/2022 1554   BILIRUBINUR negative 09/25/2022 1027   KETONESUR NEGATIVE 05/15/2022 1554   PROTEINUR Negative 09/25/2022 1027   PROTEINUR NEGATIVE 09/25/2020 2159   UROBILINOGEN 0.2 09/25/2022 1027   UROBILINOGEN 0.2 05/15/2022 1554   NITRITE negative 09/25/2022 1027   NITRITE NEGATIVE 05/15/2022 1554   LEUKOCYTESUR Negative 09/25/2022 1027   LEUKOCYTESUR NEGATIVE 05/15/2022 1554    Radiological Exams on Admission: CT Chest Wo Contrast Result Date: 02/10/2023 CLINICAL DATA:  Blunt chest trauma after fall 2 days ago. Bilateral rib pain. Bruising to left breast. Shortness of breath since fall. EXAM: CT  CHEST WITHOUT CONTRAST TECHNIQUE: Multidetector CT imaging of the chest was performed following the standard protocol without IV contrast. RADIATION DOSE REDUCTION: This exam was performed according to the departmental dose-optimization program which includes automated exposure control, adjustment of the mA and/or kV according to patient size and/or use of iterative reconstruction technique. COMPARISON:  09/26/2020 FINDINGS: Cardiovascular: Coronary artery and aortic atherosclerotic calcification. No pericardial effusion. Mediastinum/Nodes: Trachea and esophagus are unremarkable. No thoracic adenopathy. Lungs/Pleura: Emphysema. Biapical pleural-parenchymal scarring. No focal consolidation, pleural effusion, or pneumothorax. Upper Abdomen: No acute abnormality. Musculoskeletal: Acute nondisplaced buckle fractures of the anterior right 5th-8th ribs. Acute minimally displaced fractures of the left anterior 5th-7th ribs. IMPRESSION: Nondisplaced fractures of the right anterior 5th-8th ribs and minimally displaced fractures of the left 5th-7th ribs. Aortic Atherosclerosis (ICD10-I70.0) and Emphysema (ICD10-J43.9). Electronically Signed   By: Minerva Fester M.D.   On: 02/10/2023 21:02      Assessment/Plan   Principal Problem:   Rib fractures   ***            ***                ***                 ***               ***               ***               ***                ***               ***               ***               ***               ***              ***          ***  DVT prophylaxis: SCD's ***  Code Status: Full code*** Family Communication: none*** Disposition Plan: Per Rounding Team Consults called: none***;  Admission status: ***     I SPENT GREATER THAN 75 *** MINUTES IN CLINICAL CARE TIME/MEDICAL  DECISION-MAKING IN COMPLETING THIS ADMISSION.      Chaney Born Cylas Falzone DO [facility]  From 7PM - 7AM   02/11/2023, 12:02 AM   ***

## 2023-02-12 DIAGNOSIS — W19XXXA Unspecified fall, initial encounter: Secondary | ICD-10-CM | POA: Diagnosis not present

## 2023-02-12 DIAGNOSIS — F319 Bipolar disorder, unspecified: Secondary | ICD-10-CM | POA: Diagnosis not present

## 2023-02-12 DIAGNOSIS — I5032 Chronic diastolic (congestive) heart failure: Secondary | ICD-10-CM | POA: Diagnosis not present

## 2023-02-12 DIAGNOSIS — S2243XA Multiple fractures of ribs, bilateral, initial encounter for closed fracture: Secondary | ICD-10-CM | POA: Diagnosis not present

## 2023-02-12 LAB — BASIC METABOLIC PANEL
Anion gap: 6 (ref 5–15)
BUN: 38 mg/dL — ABNORMAL HIGH (ref 8–23)
CO2: 22 mmol/L (ref 22–32)
Calcium: 8.3 mg/dL — ABNORMAL LOW (ref 8.9–10.3)
Chloride: 107 mmol/L (ref 98–111)
Creatinine, Ser: 1.87 mg/dL — ABNORMAL HIGH (ref 0.44–1.00)
GFR, Estimated: 27 mL/min — ABNORMAL LOW (ref >60)
Glucose, Bld: 116 mg/dL — ABNORMAL HIGH (ref 70–99)
Potassium: 5.1 mmol/L (ref 3.5–5.1)
Sodium: 135 mmol/L (ref 135–145)

## 2023-02-12 LAB — GLUCOSE, CAPILLARY
Glucose-Capillary: 128 mg/dL — ABNORMAL HIGH (ref 70–99)
Glucose-Capillary: 77 mg/dL (ref 70–99)

## 2023-02-12 MED ORDER — OXYCODONE-ACETAMINOPHEN 5-325 MG PO TABS
1.0000 | ORAL_TABLET | ORAL | Status: DC | PRN
  Administered 2023-02-12: 2 via ORAL
  Filled 2023-02-12: qty 2

## 2023-02-12 MED ORDER — POLYETHYLENE GLYCOL 3350 17 G PO PACK: 17.0000 g | PACK | Freq: Two times a day (BID) | ORAL | 0 refills | Status: DC

## 2023-02-12 MED ORDER — FAMOTIDINE 20 MG PO TABS
20.0000 mg | ORAL_TABLET | Freq: Every day | ORAL | Status: DC
Start: 2023-02-12 — End: 2023-02-12
  Administered 2023-02-12: 20 mg via ORAL
  Filled 2023-02-12: qty 1

## 2023-02-12 MED ORDER — CALCIUM CARBONATE ANTACID 500 MG PO CHEW: 1.0000 | CHEWABLE_TABLET | Freq: Four times a day (QID) | ORAL | Status: DC | PRN

## 2023-02-12 MED ORDER — OXYCODONE-ACETAMINOPHEN 5-325 MG PO TABS: 1.0000 | ORAL_TABLET | ORAL | 0 refills | Status: AC | PRN

## 2023-02-12 MED ORDER — METOPROLOL TARTRATE 25 MG PO TABS: 25.0000 mg | ORAL_TABLET | Freq: Two times a day (BID) | ORAL | 3 refills | Status: DC

## 2023-02-12 NOTE — Progress Notes (Signed)
    Durable Medical Equipment  (From admission, onward)           Start     Ordered   02/12/23 0946  For home use only DME oxygen  Once       Question Answer Comment  Length of Need 6 Months   Mode or (Route) Nasal cannula   Liters per Minute 2   Frequency Continuous (stationary and portable oxygen unit needed)   Oxygen conserving device Yes   Oxygen delivery system Gas      02/12/23 0946   02/11/23 1602  For home use only DME Walker rolling  Once       Question Answer Comment  Walker: With 5 Inch Wheels   Patient needs a walker to treat with the following condition Rib fractures      02/11/23 1601

## 2023-02-12 NOTE — TOC Transition Note (Addendum)
Transition of Care [facility]) - Discharge Note   Patient Details  Name: Kerry Powell MRN: [id number] Date of Birth: Jul 05, 1945  [facility] CM/SW Contact:  Epifanio Lesches, RN Phone Number: 02/12/2023, 2:07 PM   Clinical Narrative:    Patient will DC to: home Anticipated DC date: 02/12/2023 Family notified: yes Transport by: car       Rib fractures  Per MD patient ready for DC today. RN, patient, and  patient's husband notified of DC. Order noted for DME oxygen and RW. Referral made with [employer] for DME: oxygen, RW. Equipment will be delivered to bedside prior to d/c.  Pt without RX med concerns. Pt to pick uo meds from local pharmacy.  Post hospital f/u noted on AVS.  Husband to provide transportation to home.  RNCM will sign off for now as intervention is no longer needed. Please consult [place] again if new needs arise.   Final next level of care: Home/Self Care     Patient Goals and CMS Choice     Choice offered to / list presented to : Patient      Discharge Placement    Discharge Plan and Services Additional resources added to the After Visit Summary for                  DME Arranged: Oxygen, Walker rolling DME Agency: [facility] Date DME Agency Contacted: 02/12/23 Time DME Agency Contacted: [id number] Representative spoke with at DME Agency: Ian Malkin            Social Drivers of Health (SDOH) Interventions SDOH Screenings   Food Insecurity: No Food Insecurity (02/10/2023)  Housing: Low Risk  (02/10/2023)  Transportation Needs: No Transportation Needs (02/10/2023)  Utilities: Not At Risk (02/10/2023)  Alcohol Screen: Low Risk  (05/22/2022)  Depression (PHQ2-9): High Risk (09/25/2022)  Financial Resource Strain: Low Risk  (09/24/2022)  Physical Activity: Inactive (09/24/2022)  Social Connections: Unknown (09/24/2022)  Stress: Stress Concern Present (09/24/2022)  Tobacco Use: Medium Risk (02/11/2023)     Readmission Risk Interventions      No data to display

## 2023-02-12 NOTE — Progress Notes (Signed)
Nurse requested Mobility Specialist to perform oxygen saturation test with pt which includes removing pt from oxygen both at rest and while ambulating.  Below are the results from that testing.     Patient Saturations on Room Air while Ambulating = sp02 84% .    Patient Saturations on 1 Liter of oxygen while Ambulating = sp02 89-91%  At end of testing pt left in room on 1  Liters of oxygen.  Reported results to nurse.

## 2023-02-12 NOTE — Plan of Care (Signed)
  Problem: Clinical Measurements: Goal: Respiratory complications will improve Outcome: Progressing   Problem: Coping: Goal: Level of anxiety will decrease Outcome: Progressing   Problem: Pain Management: Goal: General experience of comfort will improve Outcome: Progressing

## 2023-02-12 NOTE — Progress Notes (Signed)
Mobility Specialist: Progress Note   02/12/23 1304  Mobility  Activity Ambulated with assistance in hallway  Level of Assistance Standby assist, set-up cues, supervision of patient - no hands on  Assistive Device Front wheel walker  Distance Ambulated (ft) 300 ft  Activity Response Tolerated well  Mobility Referral Yes  Mobility visit 1 Mobility  Mobility Specialist Start Time (ACUTE ONLY) 1237  Mobility Specialist Stop Time (ACUTE ONLY) 1301  Mobility Specialist Time Calculation (min) (ACUTE ONLY) 24 min    During Mobility: SpO2 84% RA, 89-91% 1LO2 Post-Mobility: SpO2 89-93% 1LO2  RN requested amb sat test, pt agreeable to session - received ambulating in room. Pt was off [place] - SpO2 84% RA. Titrated to 1LO2, pt maintained SpO2 89-91%. Desat 5x to SpO2 82-87% 1LO2 throughout session but would recover after brief standing break and pursed lip breathing. Returned to room without fault. Left on EOB on 1LO2 with all needs met, call bell in reach. SpO2 89-93% 1LO2.   Maurene Capes Mobility Specialist Please contact via SecureChat or Rehab office at [phone]

## 2023-02-12 NOTE — Progress Notes (Signed)
Physical Therapy Treatment Patient Details Name: Kerry Powell MRN: [id number] DOB: 1945-07-18 Today's Date: 02/12/2023   History of Present Illness Pt is a 77 yo female who presented after fall with immediate chest pain. Found to have B rib fx L5-7, R5-8. PMH significant for COPD, stage IIIb CKD with baseline creatinine of 1.5-2.0, type 2 diabetes mellitus, HTN, chronic diastolic heart failure, Bipolar disorder, dysphagia, CVA, gout, occipital neuralgia, OA, restless leg syndrome, thoracic aortic aneurysm, trigeminal neuralgia R side of face.    PT Comments  Prior to planned d/c home, PT reviewed incentive spirometer/flutter valve use, bed mobility, car transfer technique, appropriate DME, and activity recommendations. Pt able to enter and exit the bed without physical assist, but has difficulty keeping HOB flat due to rib pain and difficulty breathing. SpO2 92% on 1L O2. Suggested she may need to sleep in recliner temporarily for comfort. Pt completing transfers with supervision. Deferred further ambulation as she previously walked with mobility specialist. Pt with no further questions or concerns and no follow up PT recommended.    If plan is discharge home, recommend the following: A little help with walking and/or transfers;Assistance with cooking/housework;Assist for transportation;Help with stairs or ramp for entrance   Can travel by private vehicle        Equipment Recommendations  Rolling walker (2 wheels)    Recommendations for Other Services       Precautions / Restrictions Precautions Precautions: Fall Restrictions Weight Bearing Restrictions Per Provider Order: No     Mobility  Bed Mobility Overal bed mobility: Modified Independent             General bed mobility comments: HOB elevated 14 degrees, no rails. increased time/effort    Transfers Overall transfer level: Needs assistance Equipment used: Rolling walker (2 wheels) Transfers: Sit to/from Stand Sit  to Stand: Supervision                Ambulation/Gait               General Gait Details: deferred as pt previously walked with mobility specialist   Stairs             Wheelchair Mobility     Tilt Bed    Modified Rankin (Stroke Patients Only)       Balance Overall balance assessment: Mild deficits observed, not formally tested                                          Cognition Arousal: Alert Behavior During Therapy: WFL for tasks assessed/performed Overall Cognitive Status: Within Functional Limits for tasks assessed                                          Exercises      General Comments        Pertinent Vitals/Pain Pain Assessment Pain Assessment: Faces Faces Pain Scale: Hurts little more Pain Location: ribs (R worse than L) Pain Descriptors / Indicators: Aching Pain Intervention(s): Limited activity within patient's tolerance, Monitored during session    Home Living                          Prior Function            PT  Goals (current goals can now be found in the care plan section) Acute Rehab PT Goals Patient Stated Goal: Be able to take care of her husband again PT Goal Formulation: With patient Time For Goal Achievement: 02/18/23 Potential to Achieve Goals: Good Progress towards PT goals: Progressing toward goals    Frequency    Min 1X/week      PT Plan      Co-evaluation              AM-PAC PT "6 Clicks" Mobility   Outcome Measure  Help needed turning from your back to your side while in a flat bed without using bedrails?: None Help needed moving from lying on your back to sitting on the side of a flat bed without using bedrails?: None Help needed moving to and from a bed to a chair (including a wheelchair)?: A Little Help needed standing up from a chair using your arms (e.g., wheelchair or bedside chair)?: A Little Help needed to walk in hospital room?: A  Little Help needed climbing 3-5 steps with a railing? : A Little 6 Click Score: 20    End of Session   Activity Tolerance: Patient tolerated treatment well Patient left: with call bell/phone within reach;in bed Nurse Communication: Mobility status PT Visit Diagnosis: Unsteadiness on feet (R26.81)     Time: 1330-1350 PT Time Calculation (min) (ACUTE ONLY): 20 min  Charges:    $Therapeutic Activity: 8-22 mins PT General Charges $$ ACUTE PT VISIT: 1 Visit                     Lillia Pauls, PT, DPT Acute Rehabilitation Services Office [phone]    Norval Morton 02/12/2023, 4:50 PM

## 2023-02-12 NOTE — Progress Notes (Signed)
[facility] Progress Note     Subjective: CC-  Not feeling as good today. Having more pain from her rib fractures and does not feel that the medications were helping. Seen by hospitalist this morning who switched her to percocet 1-2 tablets. She has not yet tried this medication. Requiring 2L O2. Pulling 1100 on IS.  Objective: Vital signs in last 24 hours: Temp:  [97.9 F (36.6 C)-98.1 F (36.7 C)] 97.9 F (36.6 C) (12/17 0700) Pulse Rate:  [64-74] 64 (12/17 0700) Resp:  [16-18] 18 (12/17 0700) BP: (108-123)/(61-77) 122/77 (12/17 0700) SpO2:  [87 %-92 %] 88 % (12/17 0700) Last BM Date : 02/10/23  Intake/Output from previous day: 12/16 0701 - 12/17 0700 In: 720 [P.O.:720] Out: -  Intake/Output this shift: Total I/O In: 360 [P.O.:360] Out: -   PE: Gen:  Alert, NAD Card:  RRR Pulm:  CTAB, no W/R/R, rate and effort normal on 2L via nasal cannula. Pulling 1100 on IS Abd: Soft, NT/ND Ext:  calves soft and nontender, trace BLE edema Psych: A&Ox4  Skin: no rashes noted, warm and dry  Lab Results:  Recent Labs    02/10/23 2007 02/11/23 0353  WBC 11.8* 12.5*  HGB 14.6 14.0  HCT 44.9 43.9  PLT 279 253   BMET Recent Labs    02/11/23 0916 02/12/23 0555  NA 134* 135  K 4.6 5.1  CL 105 107  CO2 24 22  GLUCOSE 190* 116*  BUN 35* 38*  CREATININE 2.15* 1.87*  CALCIUM 8.3* 8.3*   PT/INR No results for input(s): "LABPROT", "INR" in the last 72 hours. CMP     Component Value Date/Time   NA 135 02/12/2023 0555   NA 137 06/16/2019 1417   K 5.1 02/12/2023 0555   CL 107 02/12/2023 0555   CO2 22 02/12/2023 0555   GLUCOSE 116 (H) 02/12/2023 0555   BUN 38 (H) 02/12/2023 0555   BUN 18 06/16/2019 1417   CREATININE 1.87 (H) 02/12/2023 0555   CREATININE 1.44 (H) 09/30/2019 1532   CALCIUM 8.3 (L) 02/12/2023 0555   PROT 6.4 (L) 02/11/2023 0353   ALBUMIN 3.6 02/11/2023 0353   AST 17 02/11/2023 0353   ALT 20 02/11/2023 0353   ALKPHOS 86 02/11/2023 0353    BILITOT 0.7 02/11/2023 0353   GFRNONAA 27 (L) 02/12/2023 0555   GFRAA 39 (L) 07/02/2019 1000   Lipase  No results found for: "LIPASE"     Studies/Results: DG CHEST PORT 1 VIEW Result Date: 02/11/2023 CLINICAL DATA:  Trauma patient.  Follow-up. EXAM: PORTABLE CHEST 1 VIEW COMPARISON:  Radiographs 01/21/2022 and 04/18/2021. Chest CT 02/10/2023. FINDINGS: 0847 hours. The heart size and mediastinal contours are stable with aortic atherosclerosis. The known nondisplaced left rib fractures are not well visualized on this portable examination. No displaced fracture, pneumothorax or significant pleural effusion. Mild atelectasis at both lung bases. IMPRESSION: Mild bibasilar atelectasis. No pneumothorax or significant pleural effusion. Electronically Signed   By: Carey Bullocks M.D.   On: 02/11/2023 09:11   CT Chest Wo Contrast Result Date: 02/10/2023 CLINICAL DATA:  Blunt chest trauma after fall 2 days ago. Bilateral rib pain. Bruising to left breast. Shortness of breath since fall. EXAM: CT CHEST WITHOUT CONTRAST TECHNIQUE: Multidetector CT imaging of the chest was performed following the standard protocol without IV contrast. RADIATION DOSE REDUCTION: This exam was performed according to the departmental dose-optimization program which includes automated exposure control, adjustment of the mA and/or kV according to patient size and/or use  of iterative reconstruction technique. COMPARISON:  09/26/2020 FINDINGS: Cardiovascular: Coronary artery and aortic atherosclerotic calcification. No pericardial effusion. Mediastinum/Nodes: Trachea and esophagus are unremarkable. No thoracic adenopathy. Lungs/Pleura: Emphysema. Biapical pleural-parenchymal scarring. No focal consolidation, pleural effusion, or pneumothorax. Upper Abdomen: No acute abnormality. Musculoskeletal: Acute nondisplaced buckle fractures of the anterior right 5th-8th ribs. Acute minimally displaced fractures of the left anterior 5th-7th ribs.  IMPRESSION: Nondisplaced fractures of the right anterior 5th-8th ribs and minimally displaced fractures of the left 5th-7th ribs. Aortic Atherosclerosis (ICD10-I70.0) and Emphysema (ICD10-J43.9). Electronically Signed   By: Minerva Fester M.D.   On: 02/10/2023 21:02    Anti-infectives: Anti-infectives (From admission, onward)    None        Assessment/Plan Mechanical fall   Bilateral rib fx L5-7, R5-8 - multimodal pain control, pulm toilet, IS. Flutter valve added. Continue mucinex. PO narcotic switched to percocet this AM by primary team - will see if this improves pain control. Wean oxygen as able. Suspect hypoxia may improve if her pain control is better. Home O2 has been ordered by primary team. FEN - regular diet DVT - SCDs, LMWH, plavix Dispo - Med-surg. Therapies - recommending no follow up. Wean O2 as able. Home when pain controlled on oral regimen.  I reviewed hospitalist notes, last 24 h vitals and pain scores, last 48 h intake and output, and last 24 h labs and trends.    LOS: 1 day    Franne Forts, [facility] 02/12/2023, 11:45 AM Please see Amion for pager number during day hours 7:00am-4:30pm

## 2023-02-12 NOTE — Progress Notes (Signed)
    Durable Medical Equipment  (From admission, onward)           Start     Ordered   02/12/23 1403  For home use only DME Walker rolling  Once       Question Answer Comment  Walker: With 5 Inch Wheels   Patient needs a walker to treat with the following condition Gait instability      02/12/23 1403   02/12/23 0946  For home use only DME oxygen  Once       Question Answer Comment  Length of Need 6 Months   Mode or (Route) Nasal cannula   Liters per Minute 2   Frequency Continuous (stationary and portable oxygen unit needed)   Oxygen conserving device Yes   Oxygen delivery system Gas      02/12/23 0946   02/11/23 1602  For home use only DME Walker rolling  Once       Question Answer Comment  Walker: With 5 Inch Wheels   Patient needs a walker to treat with the following condition Rib fractures      02/11/23 1601

## 2023-02-12 NOTE — Discharge Summary (Signed)
Physician Discharge Summary  Kerry Powell ZOX:[id number] DOB: 12-07-1945 DOA: 02/10/2023  PCP: Pincus Sanes, MD  Admit date: 02/10/2023 Discharge date: 02/12/2023  Admitted From: Home Disposition:  Home  Recommendations for Outpatient Follow-up:  Follow up with PCP in 1-2 weeks Please obtain BMP/CBC in one week   Home Health:No Equipment/Devices:None  Discharge Condition:Stable CODE STATUS:Full Diet recommendation: Heart Healthy  Brief/Interim Summary: 77 y.o. female past medical history of COPD, chronic kidney disease stage IIIb with a baseline creatinine of around 1.5-2, diabetes mellitus type 2, essential hypertension, chronic diastolic dysfunction transferred from med [facility] for multiple bilateral rib fractures after tripped from ground-level at home on 02/08/2023.   Discharge Diagnoses:  Principal Problem:   Rib fractures Active Problems:   Bipolar 1 disorder (HCC)   Essential hypertension   DM2 (diabetes mellitus, type 2) (HCC)   Hyperlipidemia   Fall   Leukocytosis   History of COPD   CKD stage 3b, GFR 30-44 ml/min (HCC)   Chronic diastolic CHF (congestive heart failure) (HCC)   Multiple fractures of ribs, bilateral, initial encounter for closed fracture   Multiple bilateral rib fracture: Secondary to mechanical fall from ground-level. CT of the chest showed no displaced rib fractures without any evidence of pneumothorax or acute cardiopulmonary processes. She was started on narcotics for pain incentive spirometry flutter valve. Trauma surgery was consulted recommended pain management. PT evaluated the patient recommended home health PT. Quired oxygen in the hospital, she was ambulated and saturations dropped she she will go home on oxygen.  Leukocytosis: Likely reactive has remained afebrile.  Bipolar disorder continue Cymbalta.  COPD: Continue Singulair and inhalers.  Acute on chronic kidney disease stage IIIb: With a baseline  creatinine 1.5-1.7 on admission 2.1 in the setting of NSAID use and ARB use. NSAIDs and ARB were both discontinued she was started on IV fluids and her creatinine returned to baseline. She continue Lasix as needed as an outpatient.  Diabetes mellitus type 2 without insulin use: With last A1c of 7.0: Continue current home regimen no changes made.  Essential hypertension: Blood pressure is well-controlled on metoprolol. Her ARB was discontinued. She will follow-up with PCP as an outpatient continue Lasix as needed.  Dyslipidemia Continue statins.  Chronic diastolic dysfunction: Her ARB was discontinued she will continue Lasix as needed. Metoprolol daily.   Discharge Instructions  Discharge Instructions     Diet - low sodium heart healthy   Complete by: As directed    Increase activity slowly   Complete by: As directed       Allergies as of 02/12/2023       Reactions   Gabapentin Other (See Comments)   Fluid retention    Clindamycin Rash   Sulfa Antibiotics Nausea And Vomiting        Medication List     STOP taking these medications    acetaminophen 500 MG tablet Commonly known as: TYLENOL   ACETAMINOPHEN-BUTALBITAL 50-325 MG Tabs   meloxicam 15 MG tablet Commonly known as: MOBIC   telmisartan 80 MG tablet Commonly known as: MICARDIS       TAKE these medications    albuterol (2.5 MG/3ML) 0.083% nebulizer solution Commonly known as: PROVENTIL Take 3 mLs (2.5 mg total) by nebulization every 6 (six) hours as needed for wheezing or shortness of breath.   allopurinol 300 MG tablet Commonly known as: ZYLOPRIM Take 1 tablet by mouth once daily   amLODipine 5 MG tablet Commonly known as: NORVASC TAKE 1 &  1/2 (ONE & ONE-HALF) TABLETS BY MOUTH ONCE DAILY   cholecalciferol 25 MCG (1000 UNIT) tablet Commonly known as: VITAMIN D3 Take 2,000 Units by mouth daily.   clopidogrel 75 MG tablet Commonly known as: PLAVIX Take 1 tablet by mouth once daily    cyclobenzaprine 5 MG tablet Commonly known as: FLEXERIL Take 1 tablet by mouth three times daily as needed for muscle spasm What changed:  when to take this additional instructions   DULoxetine 30 MG capsule Commonly known as: CYMBALTA TAKE 1  BY MOUTH ONCE DAILY   Farxiga 10 MG Tabs tablet Generic drug: dapagliflozin propanediol Take 1 tablet (10 mg total) by mouth daily. Take 10 mg by mouth daily.   ferrous sulfate 325 (65 FE) MG tablet Take 325 mg by mouth at bedtime.   fluticasone 50 MCG/ACT nasal spray Commonly known as: Flonase Place 2 sprays into both nostrils daily. What changed:  when to take this reasons to take this   furosemide 20 MG tablet Commonly known as: LASIX Take 1 tablet (20 mg total) by mouth daily.   metoprolol tartrate 25 MG tablet Commonly known as: LOPRESSOR Take 1 tablet (25 mg total) by mouth 2 (two) times daily. May take extra 25 mg tablet for as needed palpitations   montelukast 10 MG tablet Commonly known as: SINGULAIR TAKE 1 TABLET BY MOUTH ONCE DAILY AT BEDTIME   oxyCODONE-acetaminophen 5-325 MG tablet Commonly known as: PERCOCET/ROXICET Take 1-2 tablets by mouth every 4 (four) hours as needed for up to 5 days for severe pain (pain score 7-10).   pantoprazole 40 MG tablet Commonly known as: PROTONIX Take 1 tablet (40 mg total) by mouth 2 (two) times daily before a meal.   polyethylene glycol 17 g packet Commonly known as: MIRALAX / GLYCOLAX Take 17 g by mouth 2 (two) times daily.   rosuvastatin 40 MG tablet Commonly known as: CRESTOR Take 1 tablet by mouth once daily   Rybelsus 7 MG Tabs Generic drug: Semaglutide Take 1 tablet by mouth daily.   ZyrTEC Allergy 10 MG tablet Generic drug: cetirizine Take 10 mg by mouth daily as needed for allergies or rhinitis.               Durable Medical Equipment  (From admission, onward)           Start     Ordered   02/12/23 0946  For home use only DME oxygen  Once        Question Answer Comment  Length of Need 6 Months   Mode or (Route) Nasal cannula   Liters per Minute 2   Frequency Continuous (stationary and portable oxygen unit needed)   Oxygen conserving device Yes   Oxygen delivery system Gas      02/12/23 0946   02/11/23 1602  For home use only DME Walker rolling  Once       Question Answer Comment  Walker: With 5 Inch Wheels   Patient needs a walker to treat with the following condition Rib fractures      02/11/23 1601            Allergies  Allergen Reactions   Gabapentin Other (See Comments)    Fluid retention    Clindamycin Rash   Sulfa Antibiotics Nausea And Vomiting    Consultations: Trauma surgery   Procedures/Studies: DG CHEST PORT 1 VIEW Result Date: 02/11/2023 CLINICAL DATA:  Trauma patient.  Follow-up. EXAM: PORTABLE CHEST 1 VIEW COMPARISON:  Radiographs 01/21/2022 and  04/18/2021. Chest CT 02/10/2023. FINDINGS: 0847 hours. The heart size and mediastinal contours are stable with aortic atherosclerosis. The known nondisplaced left rib fractures are not well visualized on this portable examination. No displaced fracture, pneumothorax or significant pleural effusion. Mild atelectasis at both lung bases. IMPRESSION: Mild bibasilar atelectasis. No pneumothorax or significant pleural effusion. Electronically Signed   By: Carey Bullocks M.D.   On: 02/11/2023 09:11   CT Chest Wo Contrast Result Date: 02/10/2023 CLINICAL DATA:  Blunt chest trauma after fall 2 days ago. Bilateral rib pain. Bruising to left breast. Shortness of breath since fall. EXAM: CT CHEST WITHOUT CONTRAST TECHNIQUE: Multidetector CT imaging of the chest was performed following the standard protocol without IV contrast. RADIATION DOSE REDUCTION: This exam was performed according to the departmental dose-optimization program which includes automated exposure control, adjustment of the mA and/or kV according to patient size and/or use of iterative reconstruction  technique. COMPARISON:  09/26/2020 FINDINGS: Cardiovascular: Coronary artery and aortic atherosclerotic calcification. No pericardial effusion. Mediastinum/Nodes: Trachea and esophagus are unremarkable. No thoracic adenopathy. Lungs/Pleura: Emphysema. Biapical pleural-parenchymal scarring. No focal consolidation, pleural effusion, or pneumothorax. Upper Abdomen: No acute abnormality. Musculoskeletal: Acute nondisplaced buckle fractures of the anterior right 5th-8th ribs. Acute minimally displaced fractures of the left anterior 5th-7th ribs. IMPRESSION: Nondisplaced fractures of the right anterior 5th-8th ribs and minimally displaced fractures of the left 5th-7th ribs. Aortic Atherosclerosis (ICD10-I70.0) and Emphysema (ICD10-J43.9). Electronically Signed   By: Minerva Fester M.D.   On: 02/10/2023 21:02   (Echo, Carotid, EGD, Colonoscopy, ERCP)    Subjective: No complaints  Discharge Exam: Vitals:   02/11/23 2125 02/12/23 0700  BP: 123/65 122/77  Pulse: 74 64  Resp:  18  Temp: 98.1 F (36.7 C) 97.9 F (36.6 C)  SpO2: (!) 87% (!) 88%   Vitals:   02/11/23 2114 02/11/23 2125 02/11/23 2125 02/12/23 0700  BP: 112/61  123/65 122/77  Pulse: 71  74 64  Resp:    18  Temp:   98.1 F (36.7 C) 97.9 F (36.6 C)  TempSrc:   Oral Oral  SpO2:  90% (!) 87% (!) 88%  Weight:      Height:        General: Pt is alert, awake, not in acute distress Cardiovascular: RRR, S1/S2 +, no rubs, no gallops Respiratory: CTA bilaterally, no wheezing, no rhonchi Abdominal: Soft, NT, ND, bowel sounds + Extremities: no edema, no cyanosis    The results of significant diagnostics from this hospitalization (including imaging, microbiology, ancillary and laboratory) are listed below for reference.     Microbiology: No results found for this or any previous visit (from the past 240 hours).   Labs: BNP (last 3 results) No results for input(s): "BNP" in the last 8760 hours. Basic Metabolic Panel: Recent Labs   Lab 02/10/23 2007 02/11/23 0353 02/11/23 0916 02/12/23 0555  NA 134* 136 134* 135  K 4.9 4.9 4.6 5.1  CL 105 106 105 107  CO2 20* 22 24 22   GLUCOSE 194* 149* 190* 116*  BUN 38* 36* 35* 38*  CREATININE 2.08* 2.12* 2.15* 1.87*  CALCIUM 8.5* 8.5* 8.3* 8.3*  MG  --  2.0  --   --   PHOS  --  4.2  --   --    Liver Function Tests: Recent Labs  Lab 02/11/23 0353  AST 17  ALT 20  ALKPHOS 86  BILITOT 0.7  PROT 6.4*  ALBUMIN 3.6   No results for input(s): "LIPASE", "AMYLASE" in  the last 168 hours. No results for input(s): "AMMONIA" in the last 168 hours. CBC: Recent Labs  Lab 02/10/23 2007 02/11/23 0353  WBC 11.8* 12.5*  NEUTROABS 9.3* 9.1*  HGB 14.6 14.0  HCT 44.9 43.9  MCV 97.8 98.9  PLT 279 253   Cardiac Enzymes: No results for input(s): "CKTOTAL", "CKMB", "CKMBINDEX", "TROPONINI" in the last 168 hours. BNP: Invalid input(s): "POCBNP" CBG: Recent Labs  Lab 02/11/23 0728 02/11/23 1129 02/11/23 1628 02/11/23 2033 02/12/23 0722  GLUCAP 170* 141* 152* 145* 128*   D-Dimer No results for input(s): "DDIMER" in the last 72 hours. Hgb A1c No results for input(s): "HGBA1C" in the last 72 hours. Lipid Profile No results for input(s): "CHOL", "HDL", "LDLCALC", "TRIG", "CHOLHDL", "LDLDIRECT" in the last 72 hours. Thyroid function studies No results for input(s): "TSH", "T4TOTAL", "T3FREE", "THYROIDAB" in the last 72 hours.  Invalid input(s): "FREET3" Anemia work up No results for input(s): "VITAMINB12", "FOLATE", "FERRITIN", "TIBC", "IRON", "RETICCTPCT" in the last 72 hours. Urinalysis    Component Value Date/Time   COLORURINE YELLOW 02/11/2023 0129   APPEARANCEUR CLEAR 02/11/2023 0129   LABSPEC 1.013 02/11/2023 0129   PHURINE 5.0 02/11/2023 0129   GLUCOSEU >=500 (A) 02/11/2023 0129   GLUCOSEU >=1000 (A) 05/15/2022 1554   HGBUR NEGATIVE 02/11/2023 0129   BILIRUBINUR NEGATIVE 02/11/2023 0129   BILIRUBINUR negative 09/25/2022 1027   KETONESUR NEGATIVE  02/11/2023 0129   PROTEINUR NEGATIVE 02/11/2023 0129   UROBILINOGEN 0.2 09/25/2022 1027   UROBILINOGEN 0.2 05/15/2022 1554   NITRITE NEGATIVE 02/11/2023 0129   LEUKOCYTESUR MODERATE (A) 02/11/2023 0129   Sepsis Labs Recent Labs  Lab 02/10/23 2007 02/11/23 0353  WBC 11.8* 12.5*   Microbiology No results found for this or any previous visit (from the past 240 hours).   Time coordinating discharge: Over 35 minutes  SIGNED:   Marinda Elk, MD  [facility] 02/12/2023, 9:49 AM Pager   If 7PM-7AM, please contact night-coverage [email or web address] Password [email or web address]

## 2023-02-18 ENCOUNTER — Telehealth: Payer: Self-pay

## 2023-02-18 NOTE — Telephone Encounter (Signed)
PAP: Application for Marcelline Deist has been submitted to PAP Companies: [employer], via fax    PLEASE BE ADVISED

## 2023-02-19 ENCOUNTER — Emergency Department (HOSPITAL_BASED_OUTPATIENT_CLINIC_OR_DEPARTMENT_OTHER)
Admission: EM | Admit: 2023-02-19 | Discharge: 2023-02-19 | Disposition: A | Discharge status: Home / Self Care | Payer: Medicare Other | Attending: Emergency Medicine | Admitting: Emergency Medicine

## 2023-02-19 ENCOUNTER — Emergency Department (HOSPITAL_BASED_OUTPATIENT_CLINIC_OR_DEPARTMENT_OTHER): Payer: Medicare Other

## 2023-02-19 ENCOUNTER — Encounter (HOSPITAL_BASED_OUTPATIENT_CLINIC_OR_DEPARTMENT_OTHER): Payer: Self-pay | Admitting: Emergency Medicine

## 2023-02-19 ENCOUNTER — Other Ambulatory Visit: Payer: Self-pay

## 2023-02-19 DIAGNOSIS — R2242 Localized swelling, mass and lump, left lower limb: Secondary | ICD-10-CM | POA: Diagnosis present

## 2023-02-19 DIAGNOSIS — N189 Chronic kidney disease, unspecified: Secondary | ICD-10-CM | POA: Insufficient documentation

## 2023-02-19 DIAGNOSIS — S9030XA Contusion of unspecified foot, initial encounter: Secondary | ICD-10-CM

## 2023-02-19 DIAGNOSIS — M25561 Pain in right knee: Secondary | ICD-10-CM | POA: Diagnosis not present

## 2023-02-19 DIAGNOSIS — W19XXXA Unspecified fall, initial encounter: Secondary | ICD-10-CM | POA: Insufficient documentation

## 2023-02-19 DIAGNOSIS — S20212A Contusion of left front wall of thorax, initial encounter: Secondary | ICD-10-CM | POA: Diagnosis not present

## 2023-02-19 DIAGNOSIS — I509 Heart failure, unspecified: Secondary | ICD-10-CM | POA: Diagnosis not present

## 2023-02-19 DIAGNOSIS — S301XXA Contusion of abdominal wall, initial encounter: Secondary | ICD-10-CM | POA: Diagnosis not present

## 2023-02-19 DIAGNOSIS — Z79899 Other long term (current) drug therapy: Secondary | ICD-10-CM | POA: Insufficient documentation

## 2023-02-19 DIAGNOSIS — Z7902 Long term (current) use of antithrombotics/antiplatelets: Secondary | ICD-10-CM | POA: Insufficient documentation

## 2023-02-19 DIAGNOSIS — S8002XA Contusion of left knee, initial encounter: Secondary | ICD-10-CM | POA: Insufficient documentation

## 2023-02-19 DIAGNOSIS — I130 Hypertensive heart and chronic kidney disease with heart failure and stage 1 through stage 4 chronic kidney disease, or unspecified chronic kidney disease: Secondary | ICD-10-CM | POA: Insufficient documentation

## 2023-02-19 DIAGNOSIS — M7989 Other specified soft tissue disorders: Secondary | ICD-10-CM

## 2023-02-19 DIAGNOSIS — S9032XA Contusion of left foot, initial encounter: Secondary | ICD-10-CM | POA: Insufficient documentation

## 2023-02-19 LAB — COMPREHENSIVE METABOLIC PANEL
ALT: 25 U/L (ref 0–44)
AST: 22 U/L (ref 15–41)
Albumin: 3.9 g/dL (ref 3.5–5.0)
Alkaline Phosphatase: 97 U/L (ref 38–126)
Anion gap: 10 (ref 5–15)
BUN: 41 mg/dL — ABNORMAL HIGH (ref 8–23)
CO2: 24 mmol/L (ref 22–32)
Calcium: 9 mg/dL (ref 8.9–10.3)
Chloride: 101 mmol/L (ref 98–111)
Creatinine, Ser: 1.8 mg/dL — ABNORMAL HIGH (ref 0.44–1.00)
GFR, Estimated: 29 mL/min — ABNORMAL LOW (ref >60)
Glucose, Bld: 146 mg/dL — ABNORMAL HIGH (ref 70–99)
Potassium: 4.5 mmol/L (ref 3.5–5.1)
Sodium: 135 mmol/L (ref 135–145)
Total Bilirubin: 0.6 mg/dL (ref <1.2)
Total Protein: 7.2 g/dL (ref 6.5–8.1)

## 2023-02-19 LAB — CBC WITH DIFFERENTIAL/PLATELET
Abs Immature Granulocytes: 0.03 10*3/uL (ref 0.00–0.07)
Basophils Absolute: 0 10*3/uL (ref 0.0–0.1)
Basophils Relative: 0 %
Eosinophils Absolute: 0.2 10*3/uL (ref 0.0–0.5)
Eosinophils Relative: 2 %
HCT: 42.9 % (ref 36.0–46.0)
Hemoglobin: 14.2 g/dL (ref 12.0–15.0)
Immature Granulocytes: 0 %
Lymphocytes Relative: 15 %
Lymphs Abs: 1.5 10*3/uL (ref 0.7–4.0)
MCH: 32.3 pg (ref 26.0–34.0)
MCHC: 33.1 g/dL (ref 30.0–36.0)
MCV: 97.5 fL (ref 80.0–100.0)
Monocytes Absolute: 0.7 10*3/uL (ref 0.1–1.0)
Monocytes Relative: 7 %
Neutro Abs: 7.6 10*3/uL (ref 1.7–7.7)
Neutrophils Relative %: 76 %
Platelets: 279 10*3/uL (ref 150–400)
RBC: 4.4 MIL/uL (ref 3.87–5.11)
RDW: 14.2 % (ref 11.5–15.5)
WBC: 10.1 10*3/uL (ref 4.0–10.5)
nRBC: 0 % (ref 0.0–0.2)

## 2023-02-19 LAB — BRAIN NATRIURETIC PEPTIDE: B Natriuretic Peptide: 88.9 pg/mL (ref 0.0–100.0)

## 2023-02-19 LAB — CK: Total CK: 68 U/L (ref 38–234)

## 2023-02-19 MED ORDER — MORPHINE SULFATE (PF) 4 MG/ML IV SOLN
4.0000 mg | Freq: Once | INTRAVENOUS | Status: AC
  Administered 2023-02-19: 4 mg via INTRAVENOUS
  Filled 2023-02-19: qty 1

## 2023-02-19 MED ORDER — FUROSEMIDE 10 MG/ML IJ SOLN
40.0000 mg | Freq: Once | INTRAMUSCULAR | Status: AC
  Administered 2023-02-19: 40 mg via INTRAVENOUS
  Filled 2023-02-19: qty 4

## 2023-02-19 MED ORDER — OXYCODONE-ACETAMINOPHEN 5-325 MG PO TABS: 1.0000 | ORAL_TABLET | Freq: Four times a day (QID) | ORAL | 0 refills | Status: DC | PRN

## 2023-02-19 NOTE — ED Triage Notes (Addendum)
Pt reports having a fall last week, was seen here for treatment, pt has noticed edema, bruising, and pain in bilateral feet, good peripheral pulses and cap refill <2 sec, RLE is more swollen than LLE

## 2023-02-19 NOTE — ED Provider Notes (Signed)
[facility] Provider Note   CSN: [id number] Arrival date & time: 02/19/23  1940     History  Chief Complaint  Patient presents with   Leg Swelling    Kerry Powell is a 77 y.o. female history of hypertension, CKD, heart failure here presenting with leg swelling.  Patient states that she recently was admitted to the hospital after a fall and had multiple rib fractures.  Patient states that since her discharge from the hospital about 7 days ago, she has been having progressive bilateral leg swelling.  She states that the leg swelling is worse on the left side.  She also noticed bruising of the left foot and ankle.  Patient also has bilateral knee pain as well.  She states that when she was in the hospital, she has shortness of breath so they focus in a rib fracture and no extremity x-rays were performed.  Patient is not on blood thinners currently.   The history is provided by the patient.       Home Medications Prior to Admission medications   Medication Sig Start Date End Date Taking? Authorizing Provider  albuterol (PROVENTIL) (2.5 MG/3ML) 0.083% nebulizer solution Take 3 mLs (2.5 mg total) by nebulization every 6 (six) hours as needed for wheezing or shortness of breath. 11/06/21   Charlott Holler, MD  allopurinol (ZYLOPRIM) 300 MG tablet Take 1 tablet by mouth once daily 02/01/23   Pincus Sanes, MD  amLODipine (NORVASC) 5 MG tablet TAKE 1 & 1/2 (ONE & ONE-HALF) TABLETS BY MOUTH ONCE DAILY 12/17/22   Pincus Sanes, MD  cetirizine (ZYRTEC ALLERGY) 10 MG tablet Take 10 mg by mouth daily as needed for allergies or rhinitis. 05/06/19   [provider]  cholecalciferol (VITAMIN D3) 25 MCG (1000 UNIT) tablet Take 2,000 Units by mouth daily.    [provider]  clopidogrel (PLAVIX) 75 MG tablet Take 1 tablet by mouth once daily 11/22/22   Pincus Sanes, MD  cyclobenzaprine (FLEXERIL) 5 MG tablet Take 1 tablet by mouth three  times daily as needed for muscle spasm Patient taking differently: Take 5 mg by mouth at bedtime. 11/22/22   Pincus Sanes, MD  DULoxetine (CYMBALTA) 30 MG capsule TAKE 1  BY MOUTH ONCE DAILY 02/01/23   Burns, Bobette Mo, MD  FARXIGA 10 MG TABS tablet Take 1 tablet (10 mg total) by mouth daily. Take 10 mg by mouth daily. 08/15/22   Pincus Sanes, MD  ferrous sulfate 325 (65 FE) MG tablet Take 325 mg by mouth at bedtime.    [provider]  fluticasone (FLONASE) 50 MCG/ACT nasal spray Place 2 sprays into both nostrils daily. Patient taking differently: Place 2 sprays into both nostrils daily as needed for rhinitis or allergies. 11/06/21   Charlott Holler, MD  furosemide (LASIX) 20 MG tablet Take 1 tablet (20 mg total) by mouth daily. Patient taking differently: Take 20 mg by mouth daily as needed for fluid or edema. 05/15/22   Pincus Sanes, MD  metoprolol tartrate (LOPRESSOR) 25 MG tablet Take 1 tablet (25 mg total) by mouth 2 (two) times daily. May take extra 25 mg tablet for as needed palpitations 02/12/23   Wilfrid Hyser Stall, Darin Engels, MD  montelukast (SINGULAIR) 10 MG tablet TAKE 1 TABLET BY MOUTH ONCE DAILY AT BEDTIME 02/01/23   Pincus Sanes, MD  pantoprazole (PROTONIX) 40 MG tablet Take 1 tablet (40 mg total) by mouth 2 (  two) times daily before a meal. 02/01/23   Burns, Bobette Mo, MD  polyethylene glycol (MIRALAX / GLYCOLAX) 17 g packet Take 17 g by mouth 2 (two) times daily. 02/12/23   Marinda Elk, MD  rosuvastatin (CRESTOR) 40 MG tablet Take 1 tablet by mouth once daily 11/05/22   Turner, Cornelious Bryant, MD  RYBELSUS 7 MG TABS Take 1 tablet by mouth daily. 01/14/23   [provider]      Allergies    Gabapentin, Clindamycin, and Sulfa antibiotics    Review of Systems   Review of Systems  Cardiovascular:  Positive for leg swelling.  Musculoskeletal:        Left leg pain  All other systems reviewed and are negative.   Physical Exam Updated Vital Signs BP 127/61 (BP Location:  Left Arm)   Pulse 95   Temp 97.8 F (36.6 C) (Oral)   Resp 18   Ht 5\' 5"  (1.651 m)   Wt 105.2 kg   SpO2 95%   BMI 38.59 kg/m  Physical Exam Vitals and nursing note reviewed.  Constitutional:      Comments: Uncomfortable and chronically ill  HENT:     Head: Normocephalic.     Nose: Nose normal.     Mouth/Throat:     Mouth: Mucous membranes are moist.  Eyes:     Extraocular Movements: Extraocular movements intact.     Pupils: Pupils are equal, round, and reactive to light.  Cardiovascular:     Rate and Rhythm: Normal rate and regular rhythm.     Pulses: Normal pulses.     Heart sounds: Normal heart sounds.  Pulmonary:     Comments: Bruising of the left chest wall.  Lungs are clear. Abdominal:     General: Abdomen is flat.     Comments: Patient has some ecchymosis in the left lower quadrant but no obvious abdominal tenderness  Musculoskeletal:     Cervical back: Normal range of motion.     Comments: Patient has left calf tenderness.  Patient also has bruising of the left foot and ankle.  Also some ecchymosis of the left knee.  Patient has mild tenderness over the lower right foot and knee.  Normal range of motion of bilateral hips.  Skin:    Capillary Refill: Capillary refill takes less than 2 seconds.  Neurological:     General: No focal deficit present.     Mental Status: She is oriented to person, place, and time.  Psychiatric:        Mood and Affect: Mood normal.        Behavior: Behavior normal.     ED Results / Procedures / Treatments   Labs (all labs ordered are listed, but only abnormal results are displayed) Labs Reviewed  CBC WITH DIFFERENTIAL/PLATELET  COMPREHENSIVE METABOLIC PANEL  BRAIN NATRIURETIC PEPTIDE  CK    EKG None  Radiology No results found.  Procedures Procedures    Medications Ordered in ED Medications  morphine (PF) 4 MG/ML injection 4 mg (has no administration in time range)    ED Course/ Medical Decision Making/ A&P                                  Medical Decision Making Kerry Powell is a 77 y.o. female here presenting with leg swelling and left calf pain.  Patient had a fall more than a week ago.  Patient was  hospitalized but no extremity x-rays were performed.  Consider DVT versus contusion versus worsening kidney function versus CHF.  Plan to get x-rays and DVT study and CBC and CMP and BNP.  10:11 PM I reviewed patient's labs and independently interpreted imaging studies.  Patient's creatinine is 1.8 which is improved from previous.  BNP is normal.  X-rays did not show any fractures.  DVT study is negative.  Patient has been taking 20 mg of Lasix as needed.  She was given Lasix 40 mg IV in the ED.  Will have her increase her Lasix to 40 mg for the next 3 days and I also refilled her oxycodone for pain.  Problems Addressed: Contusion of foot, unspecified laterality, initial encounter: acute illness or injury Leg swelling: acute illness or injury  Amount and/or Complexity of Data Reviewed Labs: ordered. Decision-making details documented in ED Course. Radiology: ordered and independent interpretation performed. Decision-making details documented in ED Course.  Risk Prescription drug management.    Final Clinical Impression(s) / ED Diagnoses Final diagnoses:  None    Rx / DC Orders ED Discharge Orders     None         Charlynne Pander, MD 02/19/23 2213

## 2023-02-19 NOTE — Discharge Instructions (Signed)
As we discussed, your x-rays showed no fracture and your ultrasound did not show any blood clots.  Your lab work is unchanged from previous.  I recommend that you increase taking her Lasix to 40 mg daily for the next 3 days to help you with the swelling.  Please use compression stockings and keep your leg elevated  I have also prescribed some Percocet as needed for pain  Please follow-up with your doctor  Return to ER if you have worse swelling or chest pain or shortness of breath.

## 2023-02-19 NOTE — ED Notes (Signed)

## 2023-03-02 ENCOUNTER — Other Ambulatory Visit: Payer: Self-pay | Admitting: Internal Medicine

## 2023-03-05 NOTE — Progress Notes (Signed)
 Subjective:    Patient ID: Kerry Powell, female    DOB: [birth year], 78 y.o.   MRN: [id number]     HPI Kerry Powell is here for follow up from the hospital  Admitted 12/15 - 12/17 after fall at home - tripped and had a ground level fall.  Xray and Ct chest showed multiple b/l rib fractures.   No PTX.   Given narcotics, spirometry flutter valve.  Trauma surgery consulted - advised pain management.  Pt recommended home PT.  Oxygen dropped in hospital - discharged home on oxygen.  Had leukocytosis - likely reactive.  AKI on CKD - IVF started, nsaids, ARB held.  Lasix  prn continued.   Chronic medical problems stable - continued on home meds.   ED 12/24 - leg swelling, foot contusion.  Since d/c had progressive leg swelling.  LLE > RLE.  Noticed bruising on left foot and ankle.  US  neg for DVT  cmp, cbc, bnp stable/normal.  Xrays of b/l feet/ankles, knees showed soft tissue swelling  - no acute injury.  Given lasix  40 mg, morphine .   Back to taking lasix  20 mg daily prn.  Swelling has resolved.    Pain is fairly controlled with tylenol  or percocet 5-325 mg as needed.  Uses oxygen as needed if O2 less than 90% - uses it at night and during the day as needed.  Using it every day.    Medications and allergies reviewed with patient and updated if appropriate.  Current Outpatient Medications on File Prior to Visit  Medication Sig Dispense Refill   albuterol  (PROVENTIL ) (2.5 MG/3ML) 0.083% nebulizer solution Take 3 mLs (2.5 mg total) by nebulization every 6 (six) hours as needed for wheezing or shortness of breath. 75 mL 12   allopurinol  (ZYLOPRIM ) 300 MG tablet Take 1 tablet by mouth once daily 90 tablet 0   amLODipine  (NORVASC ) 5 MG tablet TAKE 1 & 1/2 (ONE & ONE-HALF) TABLETS BY MOUTH ONCE DAILY 135 tablet 0   cetirizine  (ZYRTEC  ALLERGY) 10 MG tablet Take 10 mg by mouth daily as needed for allergies or rhinitis.     cholecalciferol (VITAMIN D3) 25 MCG (1000 UNIT) tablet Take 2,000 Units by mouth  daily.     clopidogrel  (PLAVIX ) 75 MG tablet Take 1 tablet by mouth once daily 90 tablet 0   cyclobenzaprine  (FLEXERIL ) 5 MG tablet Take 1 tablet by mouth three times daily as needed for muscle spasm (Patient taking differently: Take 5 mg by mouth at bedtime.) 270 tablet 1   DULoxetine  (CYMBALTA ) 30 MG capsule TAKE 1  BY MOUTH ONCE DAILY 90 capsule 0   FARXIGA  10 MG TABS tablet Take 1 tablet (10 mg total) by mouth daily. Take 10 mg by mouth daily. 90 tablet 3   ferrous sulfate  325 (65 FE) MG tablet Take 325 mg by mouth at bedtime.     fluticasone  (FLONASE ) 50 MCG/ACT nasal spray Place 2 sprays into both nostrils daily. (Patient taking differently: Place 2 sprays into both nostrils daily as needed for rhinitis or allergies.) 11.1 mL 11   furosemide  (LASIX ) 20 MG tablet Take 1 tablet (20 mg total) by mouth daily. (Patient taking differently: Take 20 mg by mouth daily as needed for fluid or edema.) 90 tablet 2   meloxicam  (MOBIC ) 15 MG tablet Take 15 mg by mouth daily.     metoprolol  tartrate (LOPRESSOR ) 25 MG tablet Take 1 tablet (25 mg total) by mouth 2 (two) times daily. May take extra  25 mg tablet for as needed palpitations 60 tablet 3   montelukast  (SINGULAIR ) 10 MG tablet TAKE 1 TABLET BY MOUTH ONCE DAILY AT BEDTIME 30 tablet 0   oxyCODONE -acetaminophen  (PERCOCET) 5-325 MG tablet Take 1 tablet by mouth every 6 (six) hours as needed. 10 tablet 0   pantoprazole  (PROTONIX ) 40 MG tablet Take 1 tablet (40 mg total) by mouth 2 (two) times daily before a meal. 180 tablet 1   polyethylene glycol (MIRALAX  / GLYCOLAX ) 17 g packet Take 17 g by mouth 2 (two) times daily. 14 each 0   rosuvastatin  (CRESTOR ) 40 MG tablet Take 1 tablet by mouth once daily 90 tablet 1   RYBELSUS  7 MG TABS Take 1 tablet by mouth daily.     No current facility-administered medications on file prior to visit.     Review of Systems  Constitutional:  Negative for fever.  Respiratory:  Positive for cough (dry) and shortness of  breath. Negative for wheezing.   Cardiovascular:  Positive for chest pain (rib pain and sternum pain from fractures). Negative for palpitations and leg swelling.  Neurological:  Positive for headaches (slight at times). Negative for light-headedness.       Objective:   Vitals:   03/06/23 1553  BP: 126/84  Pulse: 82  Temp: 98 F (36.7 C)  SpO2: 96%   BP Readings from Last 3 Encounters:  03/06/23 126/84  02/19/23 109/76  02/12/23 127/77   Wt Readings from Last 3 Encounters:  03/06/23 222 lb (100.7 kg)  02/19/23 231 lb 14.8 oz (105.2 kg)  02/11/23 231 lb 14.8 oz (105.2 kg)   Body mass index is 36.94 kg/m.    Physical Exam Constitutional:      General: She is not in acute distress.    Appearance: Normal appearance.  HENT:     Head: Normocephalic and atraumatic.  Eyes:     Conjunctiva/sclera: Conjunctivae normal.  Cardiovascular:     Rate and Rhythm: Normal rate and regular rhythm.     Heart sounds: Normal heart sounds.  Pulmonary:     Effort: Pulmonary effort is normal. No respiratory distress.     Breath sounds: Normal breath sounds. No wheezing.  Musculoskeletal:     Cervical back: Neck supple.     Right lower leg: No edema.     Left lower leg: No edema.  Lymphadenopathy:     Cervical: No cervical adenopathy.  Skin:    General: Skin is warm and dry.     Findings: No rash.  Neurological:     Mental Status: She is alert. Mental status is at baseline.  Psychiatric:        Mood and Affect: Mood normal.        Behavior: Behavior normal.        Lab Results  Component Value Date   WBC 10.1 02/19/2023   HGB 14.2 02/19/2023   HCT 42.9 02/19/2023   PLT 279 02/19/2023   GLUCOSE 146 (H) 02/19/2023   CHOL 137 12/05/2022   TRIG 162.0 (H) 12/05/2022   HDL 47.10 12/05/2022   LDLDIRECT 79.0 08/01/2020   LDLCALC 58 12/05/2022   ALT 25 02/19/2023   AST 22 02/19/2023   NA 135 02/19/2023   K 4.5 02/19/2023   CL 101 02/19/2023   CREATININE 1.80 (H) 02/19/2023    BUN 41 (H) 02/19/2023   CO2 24 02/19/2023   TSH 3.48 08/01/2020   INR 0.91 06/30/2009   HGBA1C 7.0 (H) 12/05/2022   MICROALBUR <0.7 05/15/2022  Assessment & Plan:    See Problem List for Assessment and Plan of chronic medical problems.

## 2023-03-06 ENCOUNTER — Ambulatory Visit: Payer: Medicare Other | Admitting: Internal Medicine

## 2023-03-06 ENCOUNTER — Encounter: Payer: Self-pay | Admitting: Internal Medicine

## 2023-03-06 VITALS — BP 126/84 | HR 82 | Temp 98.0°F | Ht 65.0 in | Wt 222.0 lb

## 2023-03-06 DIAGNOSIS — I10 Essential (primary) hypertension: Secondary | ICD-10-CM | POA: Diagnosis not present

## 2023-03-06 DIAGNOSIS — R0902 Hypoxemia: Secondary | ICD-10-CM

## 2023-03-06 DIAGNOSIS — R600 Localized edema: Secondary | ICD-10-CM | POA: Diagnosis not present

## 2023-03-06 DIAGNOSIS — S2243XA Multiple fractures of ribs, bilateral, initial encounter for closed fracture: Secondary | ICD-10-CM | POA: Diagnosis not present

## 2023-03-06 HISTORY — DX: Hypoxemia: R09.02

## 2023-03-06 LAB — LAB REPORT - SCANNED: Creatinine, POC: 97.9 mg/dL

## 2023-03-06 MED ORDER — FUROSEMIDE 20 MG PO TABS: 20.0000 mg | ORAL_TABLET | Freq: Every day | ORAL | Status: DC | PRN

## 2023-03-06 NOTE — Patient Instructions (Addendum)
       Medications changes include :   None      Return for follow up as scheduled.

## 2023-03-06 NOTE — Assessment & Plan Note (Signed)
 Chronic Controlled Swelling resolved Back to taking lasix 20 mg daily prn - continue

## 2023-03-06 NOTE — Assessment & Plan Note (Addendum)
 Acute Pain improved - at times has no pain and at times pain is severe - with movements or changes in position Taking percocet 5-325 mg q 6 hr prn Checking oxygen - when less than 88% - uses oxygen - uses it daily and nightly

## 2023-03-06 NOTE — Assessment & Plan Note (Signed)
 Chronic BP controlled  BP Readings from Last 3 Encounters:  03/06/23 126/84  02/19/23 109/76  02/12/23 127/77   Continue current medications - amlodipine 5 mg daily, metoprolol 25 mg twice daily, telmisartan 80 mg daily

## 2023-03-06 NOTE — Assessment & Plan Note (Addendum)
 New Related to rib fractures Using oxygen prn - monitors oxygen level at home - uses oxygen when O2 < 90% Uses daily and nightly Continue monitoring oxygen ad using the oxygen as needed

## 2023-03-07 ENCOUNTER — Encounter: Payer: Self-pay | Admitting: Internal Medicine

## 2023-03-21 ENCOUNTER — Telehealth: Payer: Self-pay

## 2023-03-21 MED ORDER — FARXIGA 10 MG PO TABS: 10.0000 mg | ORAL_TABLET | Freq: Every day | ORAL | 3 refills | Status: DC

## 2023-03-21 NOTE — Telephone Encounter (Signed)
sent 

## 2023-03-21 NOTE — Telephone Encounter (Signed)
PAP: Patient assistance application for Marcelline Deist has been approved by PAP Companies: [employer] from 11/07/2022 to 02/26/2024. Medication should be delivered to PAP Delivery: Home. For further shipping updates, please contact [employer] ([employer]) at [phone]. Patient ID is: [id number]  PLEASE BE ADVISED

## 2023-03-21 NOTE — Telephone Encounter (Signed)
PLEASE BE ADVISED [employer]) NEEDS NEW RX SENT TO [facility] IF IS LISTED IN HER PREFERRED PHARMACIES IN SNAPSHOT OF CHART   Melanee Spry CPhT Rx Patient Advocate [phone] (F) [phone]

## 2023-03-22 ENCOUNTER — Other Ambulatory Visit: Payer: Self-pay | Admitting: Internal Medicine

## 2023-03-28 ENCOUNTER — Encounter: Payer: Self-pay | Admitting: Internal Medicine

## 2023-03-31 MED ORDER — CYCLOBENZAPRINE HCL 5 MG PO TABS: 5.0000 mg | ORAL_TABLET | Freq: Three times a day (TID) | ORAL | 1 refills | Status: DC | PRN

## 2023-04-16 ENCOUNTER — Encounter: Payer: Self-pay | Admitting: Internal Medicine

## 2023-04-26 ENCOUNTER — Telehealth: Payer: Self-pay

## 2023-04-26 ENCOUNTER — Other Ambulatory Visit (HOSPITAL_COMMUNITY): Payer: Self-pay

## 2023-04-26 NOTE — Telephone Encounter (Signed)
 Pharmacy Patient Advocate Encounter   Received notification from Onbase that prior authorization for CYCLOBENZAPRINE 5MG  TABLETS is required/requested.   Insurance verification completed.   The patient is insured through [facility] .   Per test claim: PA required; PA submitted to above mentioned insurance via CoverMyMeds Key/confirmation #/EOC [id number] Status is pending

## 2023-04-26 NOTE — Telephone Encounter (Signed)
 Pharmacy Patient Advocate Encounter  Received notification from [facility] that Prior Authorization for Cyclobenzaprine 5 mg tablets has been APPROVED from 02/27/23 to 07/25/23. Ran test claim, Copay is $27.63. This test claim was processed through [facility]- copay amounts may vary at other pharmacies due to pharmacy/plan contracts, or as the patient moves through the different stages of their insurance plan.   PA #/Case ID/Reference #: [id number]

## 2023-05-01 ENCOUNTER — Telehealth: Payer: Self-pay | Admitting: Internal Medicine

## 2023-05-01 NOTE — Telephone Encounter (Signed)
 Copied from CRM [id number]. Topic: General - Other >> May 01, 2023 12:54 PM Adele Barthel wrote: Reason for CRM:   Patient is calling in regards to her oxygen tanks that are at her home. [employer] has informed her they will need a form/letter stating that the patient no longer requires them for the company to come remove them from her home. She states she has 1 large and 4 small tanks at her home.   CB# [phone]

## 2023-05-02 ENCOUNTER — Other Ambulatory Visit: Payer: Self-pay | Admitting: Internal Medicine

## 2023-05-05 ENCOUNTER — Encounter: Payer: Self-pay | Admitting: Internal Medicine

## 2023-05-06 NOTE — Telephone Encounter (Signed)
 Letter printed.

## 2023-05-07 NOTE — Telephone Encounter (Signed)
 Letter faxed today and conformation received.  Fax went to [phone]

## 2023-05-07 NOTE — Telephone Encounter (Signed)
 Message sent to [employer] today regarding letter.  Waiting to see if it can be pulled from Epic or if it needs to be faxed.

## 2023-05-15 ENCOUNTER — Ambulatory Visit (INDEPENDENT_AMBULATORY_CARE_PROVIDER_SITE_OTHER): Payer: Medicare Other

## 2023-05-15 ENCOUNTER — Encounter: Payer: Self-pay | Admitting: Internal Medicine

## 2023-05-15 VITALS — Ht 65.0 in | Wt 222.0 lb

## 2023-05-15 DIAGNOSIS — Z122 Encounter for screening for malignant neoplasm of respiratory organs: Secondary | ICD-10-CM

## 2023-05-15 DIAGNOSIS — Z0000 Encounter for general adult medical examination without abnormal findings: Secondary | ICD-10-CM | POA: Diagnosis not present

## 2023-05-15 DIAGNOSIS — Z87891 Personal history of nicotine dependence: Secondary | ICD-10-CM | POA: Diagnosis not present

## 2023-05-15 NOTE — Progress Notes (Signed)
 Subjective:   Kerry Powell is a 78 y.o. who presents for a Medicare Wellness preventive visit.  Visit Complete: Virtual I connected with  Kerry Powell on 05/15/23 by a audio enabled telemedicine application and verified that I am speaking with the correct person using two identifiers.  Patient Location: Home  Provider Location: Office/Clinic  I discussed the limitations of evaluation and management by telemedicine. The patient expressed understanding and agreed to proceed.  Vital Signs: Because this visit was a virtual/telehealth visit, some criteria may be missing or patient reported. Any vitals not documented were not able to be obtained and vitals that have been documented are patient reported.  VideoDeclined- This patient declined Librarian, academic. Therefore the visit was completed with audio only.  Persons Participating in Visit: Patient.  AWV Questionnaire: No: Patient Medicare AWV questionnaire was not completed prior to this visit.  Cardiac Risk Factors include: advanced age (>56men, >36 women);diabetes mellitus;hypertension;dyslipidemia;obesity (BMI >30kg/m2)     Objective:    Today's Vitals   05/15/23 1459  Weight: 222 lb (100.7 kg)  Height: 5\' 5"  (1.651 m)   Body mass index is 36.94 kg/m.     05/15/2023    2:58 PM 02/19/2023    7:54 PM 02/10/2023   11:44 PM 02/10/2023    7:46 PM 05/22/2022   12:21 PM 01/21/2022    9:43 AM 05/18/2021   11:28 AM  Advanced Directives  Does Patient Have a Medical Advance Directive? Yes Yes Yes No Yes Yes Yes  Type of Estate agent of [place];Living will  Healthcare Power of [place];Living will;Out of facility DNR (pink MOST or yellow form)  Living will Living will Healthcare Power of [place];Living will  Does patient want to make changes to medical advance directive? No - Patient declined  No - Patient declined   No - Patient declined   Copy of Healthcare Power of Attorney  in Chart? Yes - validated most recent copy scanned in chart (See row information)  No - copy requested    Yes - validated most recent copy scanned in chart (See row information)    Current Medications (verified) Outpatient Encounter Medications as of 05/15/2023  Medication Sig   albuterol (PROVENTIL) (2.5 MG/3ML) 0.083% nebulizer solution Take 3 mLs (2.5 mg total) by nebulization every 6 (six) hours as needed for wheezing or shortness of breath.   allopurinol (ZYLOPRIM) 300 MG tablet Take 1 tablet by mouth once daily   amLODipine (NORVASC) 5 MG tablet TAKE 1 & 1/2 (ONE & ONE-HALF) TABLETS BY MOUTH ONCE DAILY   cetirizine (ZYRTEC ALLERGY) 10 MG tablet Take 10 mg by mouth daily as needed for allergies or rhinitis.   cholecalciferol (VITAMIN D3) 25 MCG (1000 UNIT) tablet Take 2,000 Units by mouth daily.   clopidogrel (PLAVIX) 75 MG tablet Take 1 tablet by mouth once daily   cyclobenzaprine (FLEXERIL) 5 MG tablet Take 1 tablet (5 mg total) by mouth 3 (three) times daily as needed. for muscle spams   DULoxetine (CYMBALTA) 30 MG capsule TAKE 1  BY MOUTH ONCE DAILY   FARXIGA 10 MG TABS tablet Take 1 tablet (10 mg total) by mouth daily. Take 10 mg by mouth daily.   ferrous sulfate 325 (65 FE) MG tablet Take 325 mg by mouth at bedtime.   fluticasone (FLONASE) 50 MCG/ACT nasal spray Place 2 sprays into both nostrils daily. (Patient taking differently: Place 2 sprays into both nostrils daily as needed for rhinitis or allergies.)  furosemide (LASIX) 20 MG tablet Take 1 tablet (20 mg total) by mouth daily as needed for fluid or edema.   meloxicam (MOBIC) 15 MG tablet Take 15 mg by mouth daily.   metoprolol tartrate (LOPRESSOR) 25 MG tablet Take 1 tablet (25 mg total) by mouth 2 (two) times daily. May take extra 25 mg tablet for as needed palpitations   montelukast (SINGULAIR) 10 MG tablet TAKE 1 TABLET BY MOUTH ONCE DAILY AT BEDTIME   pantoprazole (PROTONIX) 40 MG tablet Take 1 tablet (40 mg total) by mouth  2 (two) times daily before a meal.   rosuvastatin (CRESTOR) 40 MG tablet Take 1 tablet by mouth once daily   RYBELSUS 7 MG TABS Take 1 tablet by mouth once daily   [DISCONTINUED] oxyCODONE-acetaminophen (PERCOCET) 5-325 MG tablet Take 1 tablet by mouth every 6 (six) hours as needed.   polyethylene glycol (MIRALAX / GLYCOLAX) 17 g packet Take 17 g by mouth 2 (two) times daily.   No facility-administered encounter medications on file as of 05/15/2023.    Allergies (verified) Gabapentin, Clindamycin, and Sulfa antibiotics   History: Past Medical History:  Diagnosis Date   Allergy    Anemia    Anxiety    Arthritis    BACK PAIN, CHRONIC 04/04/2007   Qualifier: Diagnosis of  By: Leone Payor MD, Charlyne Quale    BIPOLAR AFFECTIVE DISORDER 04/04/2007   Qualifier: History of  By: Leone Payor MD, Alfonse Ras E    CAD (coronary artery disease), native coronary artery    mild CAD with no obstructive disease by coronary CTA 06/2019   Cataract    removed both eyes   Chronic neck pain 04/30/2015   Cervical spondylosis [facility] Intermittent numbness and tingling in occipital region and headaches S/p 2 occipital nerve blocks - only temp relief no relief with gabapentin, ultram, brace MRI neck - facet arthrosis    CKD (chronic kidney disease) 04/26/2015   COPD (chronic obstructive pulmonary disease) (HCC)    pt denies - told this while was smoking -   Cough 04/26/2015   Depression 07/01/2017   Diabetes (HCC) 04/27/2015   Diagnosed 03/2015    Dizziness 06/10/2017   Dysphagia 04/26/2015   DYSPNEA 12/24/2007   Qualifier: Diagnosis of  By: Galen Manila     Elevated TSH 08/09/2015   GASTRIC ULCER, ACUTE 04/07/2007   Annotation: EGD 04/07/07 Qualifier: Diagnosis of  By: Leone Payor MD, Alfonse Ras E    GASTRITIS 04/07/2007   Annotation: EGD 04/07/07 Qualifier: Diagnosis of  By: Leone Payor MD, Alfonse Ras E    GERD (gastroesophageal reflux disease)    H/O renal cell carcinoma 05/23/2017   Dx 2010, s/p cryoablation  Follows with urology - Dr Liliane Shi   HIATAL HERNIA 04/07/2007   Annotation: EGD 04/07/07 Qualifier: Diagnosis of  By: Leone Payor MD, Alfonse Ras E    History of CVA (cerebrovascular accident) 04/26/2015   History of gout 04/26/2015   Hyperlipidemia    Hypertension    Hypoxia 03/06/2023   Irritable bowel syndrome 04/04/2007   Qualifier: Diagnosis of  By: Leone Payor MD, Charlyne Quale    Kidney tumor (benign)    Nausea and vomiting 09/09/2017   Occipital neuralgia 05/09/2016   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 Dr Leanna Battles in [place]   OSTEOARTHRITIS 04/04/2007   Qualifier: Diagnosis of  By: Leone Payor MD, Charlyne Quale    Personal history of colonic adenoma 06/07/2012   POSTNASAL DRIP SYNDROME 12/29/2007   Qualifier: Diagnosis of  By: Marchelle Gearing  MD, [name]     RESTLESS LEG SYNDROME 04/04/2007   Qualifier: Diagnosis of  By: Leone Payor MD, Charlyne Quale    RHINOSINUSITIS, 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   Qualifier: Diagnosis of  By: Leone Payor MD, Charlyne Quale    Sleep difficulties 09/09/2017   Snoring 04/26/2015   Stroke [facility])    x2 2011   Thoracic aortic aneurysm (HCC) 02/06/2018   Seen on MRI 01/25/18 of thoracic spine ordered by [facility] -- referred to CTS to monitor  -- 3.5 cm   TIA (transient ischemic attack)    TOBACCO ABUSE 12/17/2007   Qualifier: Diagnosis of  By: Marchelle Gearing MD, [name]     Trigeminal neuralgia of right side of face 02/08/2016   URI (upper respiratory infection) 02/08/2016   Vitamin D deficiency 03/12/2018   Past Surgical History:  Procedure Laterality Date   ABDOMINAL HYSTERECTOMY     APPENDECTOMY     CATARACT EXTRACTION     COLONOSCOPY     CYSTOCELE REPAIR     ESOPHAGOGASTRODUODENOSCOPY     KIDNEY SURGERY     benign tumor removal   ORTHOPEDIC SURGERY     Wrist & elbow   POLYPECTOMY     RECTOCELE REPAIR     TONSILECTOMY, ADENOIDECTOMY, BILATERAL MYRINGOTOMY AND TUBES Bilateral 1968   UPPER GASTROINTESTINAL ENDOSCOPY     Family History  Problem Relation Age of Onset   Congestive Heart Failure  Mother    Cancer Mother        ovarian   Heart disease Father    Diabetes Father    Kidney disease Father    Diabetes Sister    Kidney disease Sister    Colon cancer Neg Hx    Colon polyps Neg Hx    Esophageal cancer Neg Hx    Rectal cancer Neg Hx    Stomach cancer Neg Hx    Social History   Socioeconomic History   Marital status: Married    Spouse name: Not on file   Number of children: 2   Years of education: Not on file   Highest education level: 12th grade  Occupational History   Occupation: Retired  Tobacco Use   Smoking status: Former    Current packs/day: 0.00    Average packs/day: 1 pack/day for 46.0 years (46.0 ttl pk-yrs)    Types: Cigarettes    Start date: 07/01/1963    Quit date: 06/30/2009    Years since quitting: 13.8    Passive exposure: Past   Smokeless tobacco: Never  Vaping Use   Vaping status: Never Used  Substance and Sexual Activity   Alcohol use: No   Drug use: No   Sexual activity: Not Currently  Other Topics Concern   Not on file  Social History Narrative   Married, retired   1 son 1 daughter   2 caffeine/day   2 story home, but doesn't have to use stairs - son stays upstairs   Social Drivers of Health   Financial Resource Strain: Low Risk  (05/15/2023)   Overall Financial Resource Strain (CARDIA)    Difficulty of Paying Living Expenses: Not hard at all  Food Insecurity: No Food Insecurity (05/15/2023)   Hunger Vital Sign    Worried About Running Out of Food in the Last Year: Never true    Ran Out of Food in the Last Year: Never true  Transportation Needs: No Transportation Needs (05/15/2023)   PRAPARE - Administrator, Civil Service (Medical): No  Lack of Transportation (Non-Medical): No  Physical Activity: Sufficiently Active (05/15/2023)   Exercise Vital Sign    Days of Exercise per Week: 7 days    Minutes of Exercise per Session: 40 min  Stress: No Stress Concern Present (05/15/2023)   [employer] of Occupational  Health - Occupational Stress Questionnaire    Feeling of Stress : Not at all  Social Connections: Socially Integrated (05/15/2023)   Social Connection and Isolation Panel [NHANES]    Frequency of Communication with Friends and Family: More than three times a week    Frequency of Social Gatherings with Friends and Family: More than three times a week    Attends Religious Services: More than 4 times per year    Active Member of [employer] or Organizations: Yes    Attends Banker Meetings: Never    Marital Status: Married    Tobacco Counseling Counseling given: No    Clinical Intake:  Pre-visit preparation completed: Yes  Pain : No/denies pain     BMI - recorded: 36.94 Nutritional Status: BMI > 30  Obese Nutritional Risks: None Diabetes: Yes CBG done?: No Did pt. bring in CBG monitor from home?: No  Lab Results  Component Value Date   HGBA1C 7.0 (H) 12/05/2022   HGBA1C 7.1 (H) 05/15/2022   HGBA1C 7.0 (H) 11/10/2021     How often do you need to have someone help you when you read instructions, pamphlets, or other written materials from your doctor or pharmacy?: 1 - Never  Interpreter Needed?: No  Information entered by :: Hassell Halim, CMA   Activities of Daily Living     05/15/2023    3:08 PM 02/10/2023   11:44 PM  In your present state of health, do you have any difficulty performing the following activities:  Hearing? 0 0  Vision? 0 0  Difficulty concentrating or making decisions? 0 0  Walking or climbing stairs? 0   Dressing or bathing? 0   Doing errands, shopping? 0 0  Preparing Food and eating ? N   Using the Toilet? N   In the past six months, have you accidently leaked urine? Y   Comment slight - pad/depend sometimes   Do you have problems with loss of bowel control? N   Managing your Medications? N   Managing your Finances? N   Housekeeping or managing your Housekeeping? N     Patient Care Team: Pincus Sanes, MD as PCP - General  (Internal Medicine) Quintella Reichert, MD as PCP - Cardiology (Cardiology) Iva Boop, MD as Consulting Physician (Gastroenterology) Dimitri Ped, MD as Consulting Physician (Ophthalmology) Charlott Holler, MD as Consulting Physician (Pulmonary Disease) Nada Libman, MD as Consulting Physician (Vascular Surgery)  Indicate any recent Medical Services you may have received from other than [facility] providers in the past year (date may be approximate).     Assessment:   This is a routine wellness examination for [place].  Hearing/Vision screen Hearing Screening - Comments:: Denies hearing difficulties   Vision Screening - Comments:: Wears rx glasses - up to date with routine eye exams with [facility]   Goals Addressed               This Visit's Progress     Patient Stated (pt-stated)        Patient stated she wants to lose weight (about 50-60lbs).       Depression Screen     05/15/2023    3:15  PM 03/06/2023    4:03 PM 09/25/2022   10:25 AM 05/22/2022   12:17 PM 05/15/2022    2:55 PM 02/02/2022    3:36 PM 11/10/2021    3:23 PM  PHQ 2/9 Scores  PHQ - 2 Score 2 2 3 2 2  0 0  PHQ- 9 Score 3 7 13 12 12       Fall Risk     05/15/2023    3:10 PM 03/06/2023    4:03 PM 09/25/2022   10:24 AM 05/22/2022   12:21 PM 05/18/2022    5:40 PM  Fall Risk   Falls in the past year? 1 1 1  0 0  Number falls in past yr: 0 0 0 0 0  Injury with Fall? 1 1 0 0 0  Comment fractured ribs      Risk for fall due to : Impaired balance/gait;History of fall(s) No Fall Risks No Fall Risks No Fall Risks   Follow up Falls evaluation completed;Falls prevention discussed;Education provided Falls evaluation completed Falls evaluation completed Falls prevention discussed     MEDICARE RISK AT HOME:  Medicare Risk at Home Any stairs in or around the home?: Yes If so, are there any without handrails?: No Home free of loose throw rugs in walkways, pet beds, electrical cords, etc?: Yes Adequate  lighting in your home to reduce risk of falls?: Yes Life alert?: No Use of a cane, walker or w/c?: No Grab bars in the bathroom?: Yes Shower chair or bench in shower?: Yes Elevated toilet seat or a handicapped toilet?: Yes  TIMED UP AND GO:  Was the test performed?  No  Cognitive Function: 6CIT completed    11/19/2016    5:09 PM  MMSE - Mini Mental State Exam  Orientation to time 5  Orientation to Place 5  Registration 3  Attention/ Calculation 4  Recall 1  Language- name 2 objects 2  Language- repeat 1  Language- follow 3 step command 3  Language- read & follow direction 1  Write a sentence 1  Copy design 1  Total score 27        05/15/2023    3:14 PM 05/22/2022   12:23 PM  6CIT Screen  What Year? 0 points 0 points  What month? 0 points 0 points  What time? 0 points 0 points  Count back from 20 0 points 0 points  Months in reverse 0 points 0 points  Repeat phrase 0 points 0 points  Total Score 0 points 0 points    Immunizations Immunization History  Administered Date(s) Administered   PFIZER(Purple Top)SARS-COV-2 Vaccination 05/01/2019, 05/27/2019    Screening Tests Health Maintenance  Topic Date Due   Zoster Vaccines- Shingrix (1 of 2) Never done   COVID-19 Vaccine (3 - Pfizer risk series) 06/24/2019   Diabetic kidney evaluation - Urine ACR  05/15/2023   INFLUENZA VACCINE  05/27/2023 (Originally 09/27/2022)   DTaP/Tdap/Td (1 - Tdap) 12/05/2023 (Originally 03/15/1964)   Pneumonia Vaccine 22+ Years old (1 of 2 - PCV) 12/05/2023 (Originally 03/16/1951)   DEXA SCAN  12/05/2023 (Originally 03/15/2010)   HEMOGLOBIN A1C  06/05/2023   OPHTHALMOLOGY EXAM  08/17/2023   FOOT EXAM  12/05/2023   Lung Cancer Screening  02/10/2024   Diabetic kidney evaluation - eGFR measurement  02/19/2024   Medicare Annual Wellness (AWV)  05/14/2024   Hepatitis C Screening  Completed   HPV VACCINES  Aged Out   Colonoscopy  Discontinued    Health Maintenance  Health Maintenance Due  Topic Date Due   Zoster Vaccines- Shingrix (1 of 2) Never done   COVID-19 Vaccine (3 - Pfizer risk series) 06/24/2019   Diabetic kidney evaluation - Urine ACR  05/15/2023   Health Maintenance Items Addressed: 05/15/2023 Lung Cancer Screening ordered  Additional Screening:  Vision Screening: Recommended annual ophthalmology exams for early detection of glaucoma and other disorders of the eye.  Dental Screening: Recommended annual dental exams for proper oral hygiene  Community Resource Referral / Chronic Care Management: CRR required this visit?  No   CCM required this visit?  No     Plan:     I have personally reviewed and noted the following in the patient's chart:   Medical and social history Use of alcohol, tobacco or illicit drugs  Current medications and supplements including opioid prescriptions. Patient is not currently taking opioid prescriptions. Functional ability and status Nutritional status Physical activity Advanced directives List of other physicians Hospitalizations, surgeries, and ER visits in previous 12 months Vitals Screenings to include cognitive, depression, and falls Referrals and appointments  In addition, I have reviewed and discussed with patient certain preventive protocols, quality metrics, and best practice recommendations. A written personalized care plan for preventive services as well as general preventive health recommendations were provided to patient.     Darreld Mclean, CMA   05/15/2023   After Visit Summary: (MyChart) Due to this being a telephonic visit, the after visit summary with patients personalized plan was offered to patient via MyChart   Notes: Please refer to Routing Comments.

## 2023-05-15 NOTE — Patient Instructions (Addendum)
 Kerry Powell , Thank you for taking time to come for your Medicare Wellness Visit. I appreciate your ongoing commitment to your health goals. Please review the following plan we discussed and let me know if I can assist you in the future.   Referrals/Orders/Follow-Ups/Clinician Recommendations: Aim for 30 minutes of exercise or brisk walking, 6-8 glasses of water, and 5 servings of fruits and vegetables each day.   This is a list of the screening recommended for you and due dates:  Health Maintenance  Topic Date Due   Zoster (Shingles) Vaccine (1 of 2) Never done   COVID-19 Vaccine (3 - Pfizer risk series) 06/24/2019   Yearly kidney health urinalysis for diabetes  05/15/2023   Flu Shot  05/27/2023*   DTaP/Tdap/Td vaccine (1 - Tdap) 12/05/2023*   Pneumonia Vaccine (1 of 2 - PCV) 12/05/2023*   DEXA scan (bone density measurement)  12/05/2023*   Hemoglobin A1C  06/05/2023   Eye exam for diabetics  08/17/2023   Complete foot exam   12/05/2023   Screening for Lung Cancer  02/10/2024   Yearly kidney function blood test for diabetes  02/19/2024   Medicare Annual Wellness Visit  05/14/2024   Hepatitis C Screening  Completed   HPV Vaccine  Aged Out   Colon Cancer Screening  Discontinued  *Topic was postponed. The date shown is not the original due date.    Advanced directives: (In Chart) A copy of your advanced directives are scanned into your chart should your provider ever need it.  Next Medicare Annual Wellness Visit scheduled for next year: Yes

## 2023-06-04 NOTE — Patient Instructions (Addendum)
      Blood work was ordered.       Medications changes include :   Stop Meloxicam    A referral was ordered for ENT for your hoarseness and someone will call you to schedule an appointment.     Return in about 6 months (around 12/05/2023) for Physical Exam.

## 2023-06-04 NOTE — Progress Notes (Unsigned)
 Subjective:    Patient ID: Kerry Powell, female    DOB: 1945-12-07, 78 y.o.   MRN: [id number]     HPI [place] is here for follow up of her chronic medical problems.  Taking meloxicam daily-prescribed by orthopedics  Staying hoarse all the time.  It started about 4 months ago.  Did not start with a cold.  Does not talk a lot at home.   GERD controlled.  Has PND.    Wants to sleep all the time.  Fatigued all the time.  Not sleeping well at time - her husband cpap wakes her or she needs to get up and put it back on him.  She often feels like she needs to wait to go to sleep until he is asleep.   Medications and allergies reviewed with patient and updated if appropriate.  Current Outpatient Medications on File Prior to Visit  Medication Sig Dispense Refill   albuterol (PROVENTIL) (2.5 MG/3ML) 0.083% nebulizer solution Take 3 mLs (2.5 mg total) by nebulization every 6 (six) hours as needed for wheezing or shortness of breath. 75 mL 12   allopurinol (ZYLOPRIM) 300 MG tablet Take 1 tablet by mouth once daily 90 tablet 0   amLODipine (NORVASC) 5 MG tablet TAKE 1 & 1/2 (ONE & ONE-HALF) TABLETS BY MOUTH ONCE DAILY 135 tablet 0   cetirizine (ZYRTEC ALLERGY) 10 MG tablet Take 10 mg by mouth daily as needed for allergies or rhinitis.     cholecalciferol (VITAMIN D3) 25 MCG (1000 UNIT) tablet Take 2,000 Units by mouth daily.     clopidogrel (PLAVIX) 75 MG tablet Take 1 tablet by mouth once daily 90 tablet 0   cyclobenzaprine (FLEXERIL) 5 MG tablet Take 1 tablet (5 mg total) by mouth 3 (three) times daily as needed. for muscle spams 270 tablet 1   DULoxetine (CYMBALTA) 30 MG capsule TAKE 1  BY MOUTH ONCE DAILY 90 capsule 0   FARXIGA 10 MG TABS tablet Take 1 tablet (10 mg total) by mouth daily. Take 10 mg by mouth daily. 90 tablet 3   ferrous sulfate 325 (65 FE) MG tablet Take 325 mg by mouth at bedtime.     fluticasone (FLONASE) 50 MCG/ACT nasal spray Place 2 sprays into both nostrils daily.  (Patient taking differently: Place 2 sprays into both nostrils daily as needed for rhinitis or allergies.) 11.1 mL 11   furosemide (LASIX) 20 MG tablet Take 1 tablet (20 mg total) by mouth daily as needed for fluid or edema.     meloxicam (MOBIC) 15 MG tablet Take 15 mg by mouth daily.     metoprolol tartrate (LOPRESSOR) 25 MG tablet Take 1 tablet (25 mg total) by mouth 2 (two) times daily. May take extra 25 mg tablet for as needed palpitations 60 tablet 3   montelukast (SINGULAIR) 10 MG tablet TAKE 1 TABLET BY MOUTH ONCE DAILY AT BEDTIME 30 tablet 0   pantoprazole (PROTONIX) 40 MG tablet Take 1 tablet (40 mg total) by mouth 2 (two) times daily before a meal. 180 tablet 1   rosuvastatin (CRESTOR) 40 MG tablet Take 1 tablet by mouth once daily 90 tablet 1   RYBELSUS 7 MG TABS Take 1 tablet by mouth once daily 30 tablet 0   No current facility-administered medications on file prior to visit.     Review of Systems  Constitutional:  Positive for appetite change (dec). Negative for fever.  HENT:  Positive for voice change. Negative for  trouble swallowing.        If she bends over and swallows - feels like something is in the top of her throat  Respiratory:  Positive for cough (when food gets stuck in her throat - small pieces) and shortness of breath (at times with exertion). Negative for wheezing.   Cardiovascular:  Negative for chest pain, palpitations and leg swelling.  Neurological:  Positive for dizziness (mild at times), light-headedness (mild) and headaches.  Psychiatric/Behavioral:  Positive for dysphoric mood (controlled) and sleep disturbance. The patient is nervous/anxious (controlled - some dyas better than others).        Objective:   Vitals:   06/05/23 1418  BP: 118/70  Pulse: 78  Temp: 98 F (36.7 C)  SpO2: 94%   BP Readings from Last 3 Encounters:  06/05/23 118/70  03/06/23 126/84  02/19/23 109/76   Wt Readings from Last 3 Encounters:  06/05/23 229 lb (103.9 kg)   05/15/23 222 lb (100.7 kg)  03/06/23 222 lb (100.7 kg)   Body mass index is 38.11 kg/m.    Physical Exam Constitutional:      General: She is not in acute distress.    Appearance: Normal appearance.  HENT:     Head: Normocephalic and atraumatic.  Eyes:     Conjunctiva/sclera: Conjunctivae normal.  Cardiovascular:     Rate and Rhythm: Normal rate and regular rhythm.     Heart sounds: Normal heart sounds.  Pulmonary:     Effort: Pulmonary effort is normal. No respiratory distress.     Breath sounds: Normal breath sounds. No wheezing.  Musculoskeletal:     Cervical back: Neck supple.     Right lower leg: No edema.     Left lower leg: No edema.  Lymphadenopathy:     Cervical: No cervical adenopathy.  Skin:    General: Skin is warm and dry.     Findings: No rash.  Neurological:     Mental Status: She is alert. Mental status is at baseline.  Psychiatric:        Mood and Affect: Mood normal.        Behavior: Behavior normal.        Lab Results  Component Value Date   WBC 10.1 02/19/2023   HGB 14.2 02/19/2023   HCT 42.9 02/19/2023   PLT 279 02/19/2023   GLUCOSE 146 (H) 02/19/2023   CHOL 137 12/05/2022   TRIG 162.0 (H) 12/05/2022   HDL 47.10 12/05/2022   LDLDIRECT 79.0 08/01/2020   LDLCALC 58 12/05/2022   ALT 25 02/19/2023   AST 22 02/19/2023   NA 135 02/19/2023   K 4.5 02/19/2023   CL 101 02/19/2023   CREATININE 1.80 (H) 02/19/2023   BUN 41 (H) 02/19/2023   CO2 24 02/19/2023   TSH 3.48 08/01/2020   INR 0.91 06/30/2009   HGBA1C 7.0 (H) 12/05/2022   MICROALBUR <0.7 05/15/2022     Assessment & Plan:    See Problem List for Assessment and Plan of chronic medical problems.

## 2023-06-05 ENCOUNTER — Ambulatory Visit: Payer: Medicare Other | Admitting: Internal Medicine

## 2023-06-05 VITALS — BP 118/70 | HR 78 | Temp 98.0°F | Ht 65.0 in | Wt 229.0 lb

## 2023-06-05 DIAGNOSIS — I10 Essential (primary) hypertension: Secondary | ICD-10-CM | POA: Diagnosis not present

## 2023-06-05 DIAGNOSIS — E782 Mixed hyperlipidemia: Secondary | ICD-10-CM

## 2023-06-05 DIAGNOSIS — E538 Deficiency of other specified B group vitamins: Secondary | ICD-10-CM | POA: Diagnosis not present

## 2023-06-05 DIAGNOSIS — M5412 Radiculopathy, cervical region: Secondary | ICD-10-CM

## 2023-06-05 DIAGNOSIS — N1832 Chronic kidney disease, stage 3b: Secondary | ICD-10-CM

## 2023-06-05 DIAGNOSIS — M1A30X0 Chronic gout due to renal impairment, unspecified site, without tophus (tophi): Secondary | ICD-10-CM

## 2023-06-05 DIAGNOSIS — E1140 Type 2 diabetes mellitus with diabetic neuropathy, unspecified: Secondary | ICD-10-CM | POA: Diagnosis not present

## 2023-06-05 DIAGNOSIS — F3289 Other specified depressive episodes: Secondary | ICD-10-CM

## 2023-06-05 DIAGNOSIS — E1142 Type 2 diabetes mellitus with diabetic polyneuropathy: Secondary | ICD-10-CM

## 2023-06-05 DIAGNOSIS — F419 Anxiety disorder, unspecified: Secondary | ICD-10-CM

## 2023-06-05 DIAGNOSIS — R490 Dysphonia: Secondary | ICD-10-CM | POA: Insufficient documentation

## 2023-06-05 DIAGNOSIS — K219 Gastro-esophageal reflux disease without esophagitis: Secondary | ICD-10-CM

## 2023-06-05 LAB — COMPREHENSIVE METABOLIC PANEL WITH GFR
ALT: 17 U/L (ref 0–35)
AST: 17 U/L (ref 0–37)
Albumin: 4.3 g/dL (ref 3.5–5.2)
Alkaline Phosphatase: 94 U/L (ref 39–117)
BUN: 29 mg/dL — ABNORMAL HIGH (ref 6–23)
CO2: 30 meq/L (ref 19–32)
Calcium: 9.3 mg/dL (ref 8.4–10.5)
Chloride: 105 meq/L (ref 96–112)
Creatinine, Ser: 1.85 mg/dL — ABNORMAL HIGH (ref 0.40–1.20)
GFR: 25.84 mL/min — ABNORMAL LOW (ref >60.00)
Glucose, Bld: 112 mg/dL — ABNORMAL HIGH (ref 70–99)
Potassium: 4.7 meq/L (ref 3.5–5.1)
Sodium: 141 meq/L (ref 135–145)
Total Bilirubin: 0.7 mg/dL (ref 0.2–1.2)
Total Protein: 6.8 g/dL (ref 6.0–8.3)

## 2023-06-05 LAB — MICROALBUMIN / CREATININE URINE RATIO
Creatinine,U: 133.2 mg/dL
Microalb Creat Ratio: 6.4 mg/g (ref 0.0–30.0)
Microalb, Ur: 0.9 mg/dL (ref 0.0–1.9)

## 2023-06-05 LAB — CBC WITH DIFFERENTIAL/PLATELET
Basophils Absolute: 0 10*3/uL (ref 0.0–0.1)
Basophils Relative: 0.5 % (ref 0.0–3.0)
Eosinophils Absolute: 0.2 10*3/uL (ref 0.0–0.7)
Eosinophils Relative: 2.4 % (ref 0.0–5.0)
HCT: 46.3 % — ABNORMAL HIGH (ref 36.0–46.0)
Hemoglobin: 14.9 g/dL (ref 12.0–15.0)
Lymphocytes Relative: 21.1 % (ref 12.0–46.0)
Lymphs Abs: 2.1 10*3/uL (ref 0.7–4.0)
MCHC: 32.1 g/dL (ref 30.0–36.0)
MCV: 100.3 fl — ABNORMAL HIGH (ref 78.0–100.0)
Monocytes Absolute: 0.8 10*3/uL (ref 0.1–1.0)
Monocytes Relative: 8.2 % (ref 3.0–12.0)
Neutro Abs: 6.7 10*3/uL (ref 1.4–7.7)
Neutrophils Relative %: 67.8 % (ref 43.0–77.0)
Platelets: 267 10*3/uL (ref 150.0–400.0)
RBC: 4.62 Mil/uL (ref 3.87–5.11)
RDW: 13.6 % (ref 11.5–15.5)
WBC: 9.8 10*3/uL (ref 4.0–10.5)

## 2023-06-05 LAB — LIPID PANEL
Cholesterol: 119 mg/dL (ref 0–200)
HDL: 41.9 mg/dL (ref >39.00)
LDL Cholesterol: 57 mg/dL (ref 0–99)
NonHDL: 76.85
Total CHOL/HDL Ratio: 3
Triglycerides: 101 mg/dL (ref 0.0–149.0)
VLDL: 20.2 mg/dL (ref 0.0–40.0)

## 2023-06-05 LAB — HEMOGLOBIN A1C: Hgb A1c MFr Bld: 7.2 % — ABNORMAL HIGH (ref 4.6–6.5)

## 2023-06-05 MED ORDER — CYANOCOBALAMIN 1000 MCG/ML IJ SOLN
1000.0000 ug | Freq: Once | INTRAMUSCULAR | Status: AC
  Administered 2023-06-05: 1000 ug via INTRAMUSCULAR

## 2023-06-05 NOTE — Assessment & Plan Note (Signed)
 Chronic  Lab Results  Component Value Date   HGBA1C 7.0 (H) 12/05/2022   Sugars somewhat controlled-would like A1c less than 7 Check A1c Continue Rybelsus to 14 mg daily, Farxiga 10 mg daily Encouraged regular exercise, diabetic diet Encouraged weight loss

## 2023-06-05 NOTE — Assessment & Plan Note (Signed)
Chronic Check lipid panel  Continue crestor 40 mg daily Regular exercise and healthy diet encouraged

## 2023-06-05 NOTE — Assessment & Plan Note (Signed)
Chronic Controlled Continue cymbalta to 30 mg daily

## 2023-06-05 NOTE — Assessment & Plan Note (Signed)
 Chronic  likely diabetic neuropathy Unable to take gabapentin because of fluid retention Continue cymbalta 30 mg daily-we can increase this if needed

## 2023-06-05 NOTE — Assessment & Plan Note (Addendum)
 Chronic CBC, CMP Has been taking meloxicam 15 mg daily-prescribed orthopedics-advised her not to take this since this could further damage her kidneys On Farxiga 10 mg daily, Lasix 20 mg daily as needed Following with Dr. Malen Gauze

## 2023-06-05 NOTE — Assessment & Plan Note (Signed)
Chronic Controlled, stable - no episodes of gout Continue allopurinol 300 mg daily

## 2023-06-05 NOTE — Assessment & Plan Note (Signed)
 Chronic BP controlled  BP Readings from Last 3 Encounters:  03/06/23 126/84  02/19/23 109/76  02/12/23 127/77   Continue current medications - amlodipine 5 mg daily, metoprolol 25 mg twice daily

## 2023-06-05 NOTE — Assessment & Plan Note (Addendum)
 Chronic Persistent x 4 months or so Has a feeling of something in the top of her throat Little particles of food get stuck at times Hx of esophageal dilation - ? Gerd or esophageal stricture, PND, vocal cord paralysis, polyps Referral to ENT

## 2023-06-05 NOTE — Assessment & Plan Note (Signed)
Chronic Controlled, Stable Continue duloxetine 30 mg daily 

## 2023-06-05 NOTE — Assessment & Plan Note (Addendum)
 Chronic GERD controlled-she feels her GERD is controlled ?  GERD contributing to hoarseness Continue pantoprazole 40 mg bid

## 2023-06-05 NOTE — Assessment & Plan Note (Signed)
Chronic Continue monthly B12 injections Due for B12 B12 injection today

## 2023-06-05 NOTE — Assessment & Plan Note (Signed)
 Chronic She has started to have headaches again Increased stress and decreased sleep is not helping Will take Tylenol which does not work well Not able to take any NSAIDs because of her CKD Would like to avoid narcotics

## 2023-06-07 ENCOUNTER — Encounter: Payer: Self-pay | Admitting: Internal Medicine

## 2023-06-11 ENCOUNTER — Other Ambulatory Visit: Payer: Self-pay | Admitting: Internal Medicine

## 2023-06-13 ENCOUNTER — Encounter (INDEPENDENT_AMBULATORY_CARE_PROVIDER_SITE_OTHER): Payer: Self-pay | Admitting: Internal Medicine

## 2023-06-13 DIAGNOSIS — E1140 Type 2 diabetes mellitus with diabetic neuropathy, unspecified: Secondary | ICD-10-CM

## 2023-06-16 DIAGNOSIS — Z7985 Long-term (current) use of injectable non-insulin antidiabetic drugs: Secondary | ICD-10-CM | POA: Diagnosis not present

## 2023-06-16 DIAGNOSIS — E1140 Type 2 diabetes mellitus with diabetic neuropathy, unspecified: Secondary | ICD-10-CM

## 2023-06-16 MED ORDER — MOUNJARO 2.5 MG/0.5ML ~~LOC~~ SOAJ: 2.5000 mg | SUBCUTANEOUS | 0 refills | Status: DC

## 2023-06-16 NOTE — Telephone Encounter (Signed)

## 2023-07-11 ENCOUNTER — Encounter: Payer: Self-pay | Admitting: Internal Medicine

## 2023-07-11 MED ORDER — TIRZEPATIDE 5 MG/0.5ML ~~LOC~~ SOAJ: 5.0000 mg | SUBCUTANEOUS | 0 refills | Status: DC

## 2023-07-16 ENCOUNTER — Telehealth: Payer: Self-pay | Admitting: *Deleted

## 2023-07-16 ENCOUNTER — Encounter: Payer: Self-pay | Admitting: Internal Medicine

## 2023-07-16 NOTE — Telephone Encounter (Signed)
   Name: Kerry Powell  DOB: [birth year]  MRN: [id number]  Primary Cardiologist: Gaylyn Keas, MD  Chart reviewed as part of pre-operative protocol coverage. Because of Kerry Powell past medical history and time since last visit, she will require a follow-up in-office visit in order to better assess preoperative cardiovascular risk.  Pre-op covering staff: - Please schedule appointment and call patient to inform them. If patient already had an upcoming appointment within acceptable timeframe, please add "pre-op clearance" to the appointment notes so provider is aware. - Please contact requesting surgeon's office via preferred method (i.e, phone, fax) to inform them of need for appointment prior to surgery.  Cardiology service does not prescribe Plavix .  Please reach out to prescribing office for instructions on holding Plavix  prior to surgery.  Ava Boatman, NP  07/16/2023, 4:31 PM

## 2023-07-16 NOTE — Telephone Encounter (Signed)
   Pre-operative Risk Assessment    Patient Name: Kerry Powell  DOB: 1945/06/27 MRN: [id number]   Date of last office visit: 03/20/22 Charles Connor, NP Date of next office visit: NONE   Request for Surgical Clearance    Procedure:  LEFT TOTAL KNEE ARTHROPLASTY  Date of Surgery:  Clearance TBD                                Surgeon:  DR. Marionette Sick Surgeon's Group or Practice Name:  [facility] Phone number:  [phone] MEGAN DAVIS Fax number:  [phone]   Type of Clearance Requested:   - Medical  - Pharmacy:  Hold Clopidogrel  (Plavix )     Type of Anesthesia:  CHOICE   Additional requests/questions:    Kerry Powell   07/16/2023, 9:48 AM

## 2023-07-16 NOTE — Telephone Encounter (Signed)
 Pt has been scheduled in office appt 08/05/23 with Lawana Pray, FNP @ 1:55. Pt is aware of our new address.   I will update all parties involved.

## 2023-07-18 ENCOUNTER — Encounter: Payer: Self-pay | Admitting: Internal Medicine

## 2023-07-18 NOTE — Telephone Encounter (Signed)
 Fax received from Dr. Marionette Sick with [facility] to perform a left total knee arthroplasty on patient.  Patient needs surgery clearance. Surgery is pending. Patient was seen on 11-06-21. Office protocol is a risk assessment can be sent to surgeon if patient has been seen in 60 days or less.   She is scheduled for appt with Dr Dione Franks 08/14/23 for risk assessment  Will route back to clearance pool for now

## 2023-07-19 NOTE — Telephone Encounter (Signed)
 Anything any sooner with Desai

## 2023-07-24 ENCOUNTER — Other Ambulatory Visit: Payer: Self-pay | Admitting: Internal Medicine

## 2023-07-24 ENCOUNTER — Encounter: Payer: Self-pay | Admitting: Internal Medicine

## 2023-07-24 ENCOUNTER — Ambulatory Visit (INDEPENDENT_AMBULATORY_CARE_PROVIDER_SITE_OTHER): Admitting: Internal Medicine

## 2023-07-24 VITALS — BP 146/92 | HR 89 | Temp 98.3°F | Ht 65.0 in | Wt 225.0 lb

## 2023-07-24 DIAGNOSIS — J069 Acute upper respiratory infection, unspecified: Secondary | ICD-10-CM

## 2023-07-24 DIAGNOSIS — I10 Essential (primary) hypertension: Secondary | ICD-10-CM

## 2023-07-24 MED ORDER — HYDROCODONE BIT-HOMATROP MBR 5-1.5 MG/5ML PO SOLN: 5.0000 mL | Freq: Three times a day (TID) | ORAL | 0 refills | Status: DC | PRN

## 2023-07-24 MED ORDER — AMLODIPINE BESYLATE 5 MG PO TABS: ORAL_TABLET | ORAL | 1 refills | Status: DC

## 2023-07-24 MED ORDER — PREDNISONE 20 MG PO TABS: 20.0000 mg | ORAL_TABLET | Freq: Every day | ORAL | 0 refills | Status: AC

## 2023-07-24 MED ORDER — DULOXETINE HCL 30 MG PO CPEP: 30.0000 mg | ORAL_CAPSULE | Freq: Every day | ORAL | 1 refills | Status: DC

## 2023-07-24 NOTE — Assessment & Plan Note (Signed)
 Chronic Blood pressure elevated here today, but she is sick and her last 2 blood pressure readings have been well-controlled No changes in medication today Continue amlodipine  7.5 mg daily, metoprolol  25 mg twice daily

## 2023-07-24 NOTE — Assessment & Plan Note (Signed)
 Acute Symptoms likely viral in nature Rapid COVID test is negative Hycodan cough syrup Prednisone  20 mg daily x 5 days for cough Continue symptomatic treatment with over-the-counter cold medications, Tylenol /ibuprofen Increase rest and fluids Call if symptoms worsen or do not improve

## 2023-07-24 NOTE — Patient Instructions (Addendum)
     You have a viral infection.  You do not have covid.     Medications changes include :   hycodan cough syrup, prednisone  20 mg daily x 5 days      Return if symptoms worsen or fail to improve.

## 2023-07-24 NOTE — Progress Notes (Signed)
 Subjective:    Patient ID: Kerry Powell, female    DOB: 06-24-1945, 78 y.o.   MRN: [id number]      HPI Kerry Powell is here for  Chief Complaint  Patient presents with   Cough    Cough x 1 week; Discomfort in chest when she coughs   She is here for an acute visit for cold symptoms.   Her symptoms started about 1 week ago  She is experiencing Fatigue, PND, dry cough, occasional headaches.  The cough has gotten worse.  When her illness first started she did have a sore throat and bodyaches but those have resolved.  She denies any fever, shortness of breath or wheezing.  She denies sinus pain.  She has tried taking mucinex , robitussin       Medications and allergies reviewed with patient and updated if appropriate.  Current Outpatient Medications on File Prior to Visit  Medication Sig Dispense Refill   albuterol  (PROVENTIL ) (2.5 MG/3ML) 0.083% nebulizer solution Take 3 mLs (2.5 mg total) by nebulization every 6 (six) hours as needed for wheezing or shortness of breath. 75 mL 12   allopurinol  (ZYLOPRIM ) 300 MG tablet Take 1 tablet by mouth once daily 90 tablet 0   amLODipine  (NORVASC ) 5 MG tablet TAKE 1 & 1/2 (ONE & ONE-HALF) TABLETS BY MOUTH ONCE DAILY 135 tablet 0   cetirizine  (ZYRTEC  ALLERGY) 10 MG tablet Take 10 mg by mouth daily as needed for allergies or rhinitis.     cholecalciferol (VITAMIN D3) 25 MCG (1000 UNIT) tablet Take 2,000 Units by mouth daily.     clopidogrel  (PLAVIX ) 75 MG tablet Take 1 tablet by mouth once daily 90 tablet 0   cyclobenzaprine  (FLEXERIL ) 5 MG tablet Take 1 tablet (5 mg total) by mouth 3 (three) times daily as needed. for muscle spams 270 tablet 1   DULoxetine  (CYMBALTA ) 30 MG capsule Take 1 capsule by mouth once daily 90 capsule 0   FARXIGA  10 MG TABS tablet Take 1 tablet (10 mg total) by mouth daily. Take 10 mg by mouth daily. 90 tablet 3   ferrous sulfate  325 (65 FE) MG tablet Take 325 mg by mouth at bedtime.     fluticasone  (FLONASE ) 50 MCG/ACT  nasal spray Place 2 sprays into both nostrils daily. (Patient taking differently: Place 2 sprays into both nostrils daily as needed for rhinitis or allergies.) 11.1 mL 11   furosemide  (LASIX ) 20 MG tablet Take 1 tablet (20 mg total) by mouth daily as needed for fluid or edema.     metoprolol  tartrate (LOPRESSOR ) 25 MG tablet Take 1 tablet (25 mg total) by mouth 2 (two) times daily. May take extra 25 mg tablet for as needed palpitations 60 tablet 3   montelukast  (SINGULAIR ) 10 MG tablet TAKE 1 TABLET BY MOUTH ONCE DAILY AT BEDTIME 30 tablet 0   pantoprazole  (PROTONIX ) 40 MG tablet Take 1 tablet (40 mg total) by mouth 2 (two) times daily before a meal. 180 tablet 1   rosuvastatin  (CRESTOR ) 40 MG tablet Take 1 tablet by mouth once daily 90 tablet 1   tirzepatide (MOUNJARO) 5 MG/0.5ML Pen Inject 5 mg into the skin once a week. 2 mL 0   No current facility-administered medications on file prior to visit.    Review of Systems  Constitutional:  Positive for appetite change and fatigue. Negative for chills and fever.  HENT:  Positive for postnasal drip and sore throat (initially). Negative for congestion, ear pain and sinus pain.  Respiratory:  Positive for cough (dry, rarely gets some mucus up). Negative for shortness of breath and wheezing.   Gastrointestinal:  Negative for constipation and diarrhea.  Musculoskeletal:  Positive for myalgias (initially).  Neurological:  Positive for headaches (occ). Negative for dizziness and light-headedness.       Objective:   Vitals:   07/24/23 1536  BP: (!) 146/92  Pulse: 89  Temp: 98.3 F (36.8 C)  SpO2: 94%   BP Readings from Last 3 Encounters:  07/24/23 (!) 146/92  06/05/23 118/70  03/06/23 126/84   Wt Readings from Last 3 Encounters:  07/24/23 225 lb (102.1 kg)  06/05/23 229 lb (103.9 kg)  05/15/23 222 lb (100.7 kg)   Body mass index is 37.44 kg/m.    Physical Exam Constitutional:      General: She is not in acute distress.     Appearance: Normal appearance. She is not ill-appearing.  HENT:     Head: Normocephalic and atraumatic.     Right Ear: Tympanic membrane, ear canal and external ear normal.     Left Ear: Tympanic membrane, ear canal and external ear normal.     Mouth/Throat:     Mouth: Mucous membranes are moist.     Pharynx: No oropharyngeal exudate or posterior oropharyngeal erythema.  Eyes:     Conjunctiva/sclera: Conjunctivae normal.  Cardiovascular:     Rate and Rhythm: Normal rate and regular rhythm.  Pulmonary:     Effort: Pulmonary effort is normal. No respiratory distress.     Breath sounds: Normal breath sounds. No wheezing or rales.  Musculoskeletal:     Cervical back: Neck supple. No tenderness.  Lymphadenopathy:     Cervical: No cervical adenopathy.  Skin:    General: Skin is warm and dry.  Neurological:     Mental Status: She is alert.            Assessment & Plan:    See Problem List for Assessment and Plan of chronic medical problems.

## 2023-07-25 LAB — POC COVID19 BINAXNOW: SARS Coronavirus 2 Ag: NEGATIVE

## 2023-07-25 NOTE — Addendum Note (Signed)
 Addended by: Katherene Pals on: 07/25/2023 07:49 AM   Modules accepted: Orders

## 2023-07-29 ENCOUNTER — Encounter: Payer: Self-pay | Admitting: Internal Medicine

## 2023-07-29 ENCOUNTER — Ambulatory Visit: Admitting: Internal Medicine

## 2023-07-29 VITALS — BP 118/70 | HR 98 | Ht 65.0 in | Wt 226.6 lb

## 2023-07-29 DIAGNOSIS — J440 Chronic obstructive pulmonary disease with acute lower respiratory infection: Secondary | ICD-10-CM

## 2023-07-29 DIAGNOSIS — J302 Other seasonal allergic rhinitis: Secondary | ICD-10-CM

## 2023-07-29 DIAGNOSIS — K219 Gastro-esophageal reflux disease without esophagitis: Secondary | ICD-10-CM

## 2023-07-29 DIAGNOSIS — Z01811 Encounter for preprocedural respiratory examination: Secondary | ICD-10-CM

## 2023-07-29 DIAGNOSIS — Z87891 Personal history of nicotine dependence: Secondary | ICD-10-CM

## 2023-07-29 MED ORDER — SALINE SPRAY 0.65 % NA SOLN: 1.0000 | NASAL | 1 refills | Status: AC | PRN

## 2023-07-29 MED ORDER — HYDROCODONE BIT-HOMATROP MBR 5-1.5 MG/5ML PO SOLN: 5.0000 mL | Freq: Three times a day (TID) | ORAL | 0 refills | Status: DC | PRN

## 2023-07-29 NOTE — Progress Notes (Signed)
 Kerry Powell    [id number]    [birth year]  Primary Care Physician:Burns, Beckey Bourgeois, MD Date of Appointment: 07/29/2023 Established Patient Visit  Chief complaint:   Chief Complaint  Patient presents with   Follow-up    Clearance and follow up     HPI: Kerry Powell is a 78 y.o. woman with COPD as well as bi-apical parenchymal scarring (stable on CT.) Also with LPR and dysphagia She has history of esophageal dilation.   Interval Updates: Here for follow up.   She had a respiratory virus and was treated with prednisone  by Dr. Donnette Gal, on the last day today. Using nebulizer treatment.   She feels her heart burn is well controlled.   She has been using the flonase  daily for the chronic rhinitis. Cough is keeping her up at night. She is almost out of cough syrup given by Dr. Donnette Gal.   Taking flonase  and zyrtec  daily. Also taking singulair  for the allergies.   No hospitalizations or ED visits for her breathing. She had a mechanical fall in Dec 2024 resulting in broken ribs.   She is having left knee replacement soon and needs operative pulmonary evaluation.   I have reviewed the patient's family social and past medical history and updated as appropriate.   Past Medical History:  Diagnosis Date   Allergy    Anemia    Anxiety    Arthritis    BACK PAIN, CHRONIC 04/04/2007   Qualifier: Diagnosis of  By: Willy Harvest MD, Isidor Marek    BIPOLAR AFFECTIVE DISORDER 04/04/2007   Qualifier: History of  By: Willy Harvest MD, Kelleen Patee E    CAD (coronary artery disease), native coronary artery    mild CAD with no obstructive disease by coronary CTA 06/2019   Cataract    removed both eyes   Chronic neck pain 04/30/2015   Cervical spondylosis [facility] Intermittent numbness and tingling in occipital region and headaches S/p 2 occipital nerve blocks - only temp relief no relief with gabapentin, ultram , brace MRI neck - facet arthrosis    CKD (chronic kidney disease) 04/26/2015    COPD (chronic obstructive pulmonary disease) (HCC)    pt denies - told this while was smoking -   Cough 04/26/2015   Depression 07/01/2017   Diabetes (HCC) 04/27/2015   Diagnosed 03/2015    Dizziness 06/10/2017   Dysphagia 04/26/2015   DYSPNEA 12/24/2007   Qualifier: Diagnosis of  By: Barba Levin     Elevated TSH 08/09/2015   GASTRIC ULCER, ACUTE 04/07/2007   Annotation: EGD 04/07/07 Qualifier: Diagnosis of  By: Willy Harvest MD, Kelleen Patee E    GASTRITIS 04/07/2007   Annotation: EGD 04/07/07 Qualifier: Diagnosis of  By: Willy Harvest MD, Kelleen Patee E    GERD (gastroesophageal reflux disease)    H/O renal cell carcinoma 05/23/2017   Dx 2010, s/p cryoablation Follows with urology - Dr Sherrine Dolly   HIATAL HERNIA 04/07/2007   Annotation: EGD 04/07/07 Qualifier: Diagnosis of  By: Willy Harvest MD, Kelleen Patee E    History of CVA (cerebrovascular accident) 04/26/2015   History of gout 04/26/2015   Hyperlipidemia    Hypertension    Hypoxia 03/06/2023   Irritable bowel syndrome 04/04/2007   Qualifier: Diagnosis of  By: Willy Harvest MD, Isidor Marek    Kidney tumor (benign)    Nausea and vomiting 09/09/2017   Occipital neuralgia 05/09/2016   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 Dr Raliegh Burgess in [place]  OSTEOARTHRITIS 04/04/2007   Qualifier: Diagnosis of  By: Willy Harvest MD, Isidor Marek    Personal history of colonic adenoma 06/07/2012   POSTNASAL DRIP SYNDROME 12/29/2007   Qualifier: Diagnosis of  By: Bertrum Brodie MD, [name]     RESTLESS LEG SYNDROME 04/04/2007   Qualifier: Diagnosis of  By: Willy Harvest MD, Isidor Marek    RHINOSINUSITIS, 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   Qualifier: Diagnosis of  By: Willy Harvest MD, Isidor Marek    Sleep difficulties 09/09/2017   Snoring 04/26/2015   Stroke [facility])    x2 2011   Thoracic aortic aneurysm (HCC) 02/06/2018   Seen on MRI 01/25/18 of thoracic spine ordered by [facility] -- referred to CTS to monitor  -- 3.5 cm   TIA (transient ischemic attack)    TOBACCO ABUSE 12/17/2007   Qualifier: Diagnosis of  By: Bertrum Brodie MD,  [name]     Trigeminal neuralgia of right side of face 02/08/2016   URI (upper respiratory infection) 02/08/2016   Vitamin D  deficiency 03/12/2018    Past Surgical History:  Procedure Laterality Date   ABDOMINAL HYSTERECTOMY     APPENDECTOMY     CATARACT EXTRACTION     COLONOSCOPY     CYSTOCELE REPAIR     ESOPHAGOGASTRODUODENOSCOPY     KIDNEY SURGERY     benign tumor removal   ORTHOPEDIC SURGERY     Wrist & elbow   POLYPECTOMY     RECTOCELE REPAIR     TONSILECTOMY, ADENOIDECTOMY, BILATERAL MYRINGOTOMY AND TUBES Bilateral 1968   UPPER GASTROINTESTINAL ENDOSCOPY      Family History  Problem Relation Age of Onset   Congestive Heart Failure Mother    Cancer Mother        ovarian   Heart disease Father    Diabetes Father    Kidney disease Father    Diabetes Sister    Kidney disease Sister    Colon cancer Neg Hx    Colon polyps Neg Hx    Esophageal cancer Neg Hx    Rectal cancer Neg Hx    Stomach cancer Neg Hx     Social History   Occupational History   Occupation: Retired  Tobacco Use   Smoking status: Former    Current packs/day: 0.00    Average packs/day: 1 pack/day for 46.0 years (46.0 ttl pk-yrs)    Types: Cigarettes    Start date: 07/01/1963    Quit date: 06/30/2009    Years since quitting: 14.0    Passive exposure: Past   Smokeless tobacco: Never  Vaping Use   Vaping status: Never Used  Substance and Sexual Activity   Alcohol use: No   Drug use: No   Sexual activity: Not Currently     Physical Exam: Blood pressure 118/70, pulse 98, height 5\' 5"  (1.651 m), weight 226 lb 9.6 oz (102.8 kg), SpO2 94%.  Gen:      No acute distress ENT:  cobblestoning, no nasal polyps, mucus membranes moist Lungs:    No increased respiratory effort, symmetric chest wall excursion, clear to auscultation bilaterally, no wheezes or crackles CV:         Regular rate and rhythm; no murmurs, rubs, or gallops.  No pedal edema   Data Reviewed: Imaging: I have personally  reviewed the CT Chest done dec 2021 which shows stable bi-apical scarring and severe centrilobular emphysema  PFTs:      Latest Ref Rng & Units 02/15/2020    2:48 PM  PFT Results  FVC-Pre L  2.68   FVC-Predicted Pre % 90   FVC-Post L 2.82   FVC-Predicted Post % 94   Pre FEV1/FVC % % 74   Post FEV1/FCV % % 78   FEV1-Pre L 1.97   FEV1-Predicted Pre % 88   FEV1-Post L 2.20   DLCO uncorrected ml/min/mmHg 15.98   DLCO UNC% % 80   DLCO corrected ml/min/mmHg 15.98   DLCO COR %Predicted % 80   DLVA Predicted % 95   TLC L 4.86   TLC % Predicted % 93   RV % Predicted % 85    I have personally reviewed the patient's PFTs and they show mild airflow limitation with normal lung volumes and diffusion capacity.  Labs:  Immunization status: Immunization History  Administered Date(s) Administered   PFIZER(Purple Top)SARS-COV-2 Vaccination 05/01/2019, 05/27/2019    Assessment:  COPD with Gold Stage 0 with acute exacerbation History of tobacco use disorder Chronic Rhinitis GERD  Dysphagia Pre-operative pulmonary evaluation  Plan/Recommendations: I suspect your coughing is related to nasal drainage from the viral infection.  Finish prednisone  as prescribed by Dr Donnette Gal.  Take the albuterol  inhaler as needed for shortness of breath.   Use nasal saline before the flonase  and can repeat multiple times per day to help with drainage and cough.  Continue flonase  1 spray each side daily for chronic rhinitis, continue zyrtec  and montelukast .   I have refilled your cough syrup for temporary use only.   Continue protonix .   Good luck with knee surgery!  Bella Bowman has a low risk of post-operative pulmonary complications by ARISCAT Index.  The absolute assessment of risk/benefit of the procedure is deferred to the primary team's evaluation. Recommend waiting another 2 weeks from today until her residual cough from viral infection clears up.   RECOMMENDATIONS:  In order to minimize  the risk of complications and optimize pulmonary status, we recommend the following: - Encourage aggressive incentive spirometry hourly both peri-operatively and post-operatively as tolerated  - Early ambulation and physical therapy as tolerated post-operatively - Adequate pain control especially in the setting of abdominal and thoracic surgery - Bronchodilators as needed for wheezing or shortness of breath - Oral steroids only if the patient appears to have component of COPD exacerbation, otherwise no routine utilization needed - Ideally recommend smoking cessation for >4 weeks before any intervention - Intraoperatively keep OR time to the shortest as possible - Post operatively may benefit from Nocturnal Bipap  The features of this patient's history that contribute to the pulmonary risk assessment include:  Age, COPD, General anesthesia  ARISCAT: Mazo et al. Anesthesiology 2014; [phone]    Return to Care: Return in about 6 months (around 01/28/2024).   Louie Rover, MD Pulmonary and Critical Care Medicine [facility] [phone]

## 2023-07-29 NOTE — Patient Instructions (Addendum)
 It was a pleasure to see you today!  Please schedule follow up with myself in 6 months.  If my schedule is not open yet, we will contact you with a reminder closer to that time. Please call [phone] if you haven't heard from us  a month before, and always call us  sooner if issues or concerns arise. You can also send us  a message through MyChart, but but aware that this is not to be used for urgent issues and it may take up to 5-7 days to receive a reply. Please be aware that you will likely be able to view your results before I have a chance to respond to them. Please give us  5 business days to respond to any non-urgent results.    I suspect your coughing is related to nasal drainage from the viral infection.  Finish prednisone  as prescribed by Dr Donnette Gal.  Take the albuterol  inhaler as needed for shortness of breath.   Use nasal saline before the flonase  and can repeat multiple times per day to help with drainage and cough.  Continue flonase  1 spray each side daily for chronic rhinitis, continue zyrtec  and montelukast .   I have refilled your cough syrup for temporary use only.   Continue protonix .   Good luck with knee surgery!

## 2023-08-02 NOTE — Progress Notes (Unsigned)
 Cardiology Clinic Note   Patient Name: Kerry Powell Date of Encounter: 08/05/2023  Primary Care Provider:  Colene Dauphin, MD Primary Cardiologist:  Gaylyn Keas, MD  Patient Profile    Kerry Powell 78 year old female presents the clinic today for follow-up evaluation of her coronary artery disease, diastolic CHF, and preoperative cardiac evaluation.  Past Medical History    Past Medical History:  Diagnosis Date   Allergy    Anemia    Anxiety    Arthritis    BACK PAIN, CHRONIC 04/04/2007   Qualifier: Diagnosis of  By: Willy Harvest MD, Isidor Marek    BIPOLAR AFFECTIVE DISORDER 04/04/2007   Qualifier: History of  By: Willy Harvest MD, Kelleen Patee E    CAD (coronary artery disease), native coronary artery    mild CAD with no obstructive disease by coronary CTA 06/2019   Cataract    removed both eyes   Chronic neck pain 04/30/2015   Cervical spondylosis [facility] [facility] Intermittent numbness and tingling in occipital region and headaches S/p 2 occipital nerve blocks - only temp relief no relief with gabapentin, ultram , brace MRI neck - facet arthrosis    CKD (chronic kidney disease) 04/26/2015   COPD (chronic obstructive pulmonary disease) (HCC)    pt denies - told this while was smoking -   Cough 04/26/2015   Depression 07/01/2017   Diabetes (HCC) 04/27/2015   Diagnosed 03/2015    Dizziness 06/10/2017   Dysphagia 04/26/2015   DYSPNEA 12/24/2007   Qualifier: Diagnosis of  By: Barba Levin     Elevated TSH 08/09/2015   GASTRIC ULCER, ACUTE 04/07/2007   Annotation: EGD 04/07/07 Qualifier: Diagnosis of  By: Willy Harvest MD, Kelleen Patee E    GASTRITIS 04/07/2007   Annotation: EGD 04/07/07 Qualifier: Diagnosis of  By: Willy Harvest MD, Kelleen Patee E    GERD (gastroesophageal reflux disease)    H/O renal cell carcinoma 05/23/2017   Dx 2010, s/p cryoablation Follows with urology - Dr Sherrine Dolly   HIATAL HERNIA 04/07/2007   Annotation: EGD 04/07/07 Qualifier: Diagnosis of  By: Willy Harvest MD, Kelleen Patee E    History  of CVA (cerebrovascular accident) 04/26/2015   History of gout 04/26/2015   Hyperlipidemia    Hypertension    Hypoxia 03/06/2023   Irritable bowel syndrome 04/04/2007   Qualifier: Diagnosis of  By: Willy Harvest MD, Isidor Marek    Kidney tumor (benign)    Nausea and vomiting 09/09/2017   Occipital neuralgia 05/09/2016   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 Dr Raliegh Burgess in [place]   OSTEOARTHRITIS 04/04/2007   Qualifier: Diagnosis of  By: Willy Harvest MD, Isidor Marek    Personal history of colonic adenoma 06/07/2012   POSTNASAL DRIP SYNDROME 12/29/2007   Qualifier: Diagnosis of  By: Bertrum Brodie MD, [name]     RESTLESS LEG SYNDROME 04/04/2007   Qualifier: Diagnosis of  By: Willy Harvest MD, Isidor Marek    RHINOSINUSITIS, 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   Qualifier: Diagnosis of  By: Willy Harvest MD, Isidor Marek    Sleep difficulties 09/09/2017   Snoring 04/26/2015   Stroke (HCC)    x2 2011   Thoracic aortic aneurysm (HCC) 02/06/2018   Seen on MRI 01/25/18 of thoracic spine ordered by [facility] -- referred to CTS to monitor  -- 3.5 cm   TIA (transient ischemic attack)    TOBACCO ABUSE 12/17/2007   Qualifier: Diagnosis of  By: Bertrum Brodie MD, [name]     Trigeminal neuralgia of right side of face 02/08/2016  URI (upper respiratory infection) 02/08/2016   Vitamin D  deficiency 03/12/2018   Past Surgical History:  Procedure Laterality Date   ABDOMINAL HYSTERECTOMY     APPENDECTOMY     CATARACT EXTRACTION     COLONOSCOPY     CYSTOCELE REPAIR     ESOPHAGOGASTRODUODENOSCOPY     KIDNEY SURGERY     benign tumor removal   ORTHOPEDIC SURGERY     Wrist & elbow   POLYPECTOMY     RECTOCELE REPAIR     TONSILECTOMY, ADENOIDECTOMY, BILATERAL MYRINGOTOMY AND TUBES Bilateral 1968   UPPER GASTROINTESTINAL ENDOSCOPY      Allergies  Allergies  Allergen Reactions   Gabapentin Other (See Comments)    Fluid retention    Clindamycin  Rash   Sulfa Antibiotics Nausea And Vomiting    History of Present Illness    Kerry Powell has a PMH  of thoracic aortic aneurysm, CVA, PVD, type 2 diabetes, COPD, hyperlipidemia, coronary artery disease, HTN, and bipolar disorder.  She was initially seen and evaluated by Dr. Micael Adas in 2020.  She complained of shortness of breath.  Her echocardiogram at that time showed an LVEF of 60 to 65%, moderate dilated left atrium.  Her nuclear stress test showed low risk and no evidence of ischemia.  She was noted to have thoracic aortic aneurysm on CT.  This was stable.  She followed up with Dr. Micael Adas 7/23.  She was doing well with her chronic dyspnea on exertion which was related to her COPD.  She had stable lower extremity edema.  She presented to the emergency department 11/23 for witnessed syncope at home.  She had CT and chest x-rays which did not show traumatic injury.  She was seen by Dr. Londa Rival who arranged for cardiac event monitor.  Orthostatic vitals were performed at that time.  Her cardiac event monitor showed normal sinus rhythm and multiple episodes of SVT consistent with atrial tachycardia.  Her longest episode was 1 hour 56 minutes.  She was noted to have 1 episode of NSVT lasting 5 beats and a rare episode of second-degree AV block with a heart rate of 27.  This occurred during sleeping hours.  She was also noted to have rare PACs and PVCs.  She was seen in follow-up by Charles Connor, NP on 03/20/2022.  She was stable from a cardiac standpoint.  She had no new cardiac complaints.  She noted that she had palpitations very infrequently.  She denied presyncope or syncope.  Her blood pressure was well-controlled at 110/68.  Her heart rate was 79.  She was compliant with her medication regimen.  She noted that she was experiencing mid back pain that seem to be related to possible osteoarthritis.  She was encouraged to follow-up with orthopedics.  She denied chest pain, palpitations, dyspnea, PND, weight gain, and early satiety.  She presents to the clinic today for preoperative cardiac evaluation and  follow-up evaluation.  She states she has been limited in her mobility due to her left knee pain.  She has been caring for her husband who is disabled.  We reviewed her previous cardiac history.  She expressed understanding.  Her EKG today showed sinus tachycardia 102 bpm.  On physical exam her pulse was 96 bpm.  Her blood pressure is 108/72.  She denies chest pain.  She does note a cough.  She reports that she had a upper respiratory virus 2 weeks ago.  Her previous cholesterol 4/25 was 119 total, HDL 41.9, and LDL  57.00.  I will continue her current medications, have her increase her physical activity as tolerated, send her preoperative cardiac evaluation and recommendations to her surgeon and plan follow-up in 9 to 12 months.  Today she denies chest pain, palpitations, melena, hematuria, hemoptysis, diaphoresis, weakness, presyncope, syncope, orthopnea, and PND.       Home Medications    Prior to Admission medications   Medication Sig Start Date End Date Taking? Authorizing Provider  albuterol  (PROVENTIL ) (2.5 MG/3ML) 0.083% nebulizer solution Take 3 mLs (2.5 mg total) by nebulization every 6 (six) hours as needed for wheezing or shortness of breath. 11/06/21   Aleck Hurdle, MD  allopurinol  (ZYLOPRIM ) 300 MG tablet Take 1 tablet by mouth once daily 06/11/23   Colene Dauphin, MD  amLODipine  (NORVASC ) 5 MG tablet TAKE 1 & 1/2 (ONE & ONE-HALF) TABLETS BY MOUTH ONCE DAILY 07/24/23   Colene Dauphin, MD  cetirizine  (ZYRTEC  ALLERGY) 10 MG tablet Take 10 mg by mouth daily as needed for allergies or rhinitis. 05/06/19   [provider]  cholecalciferol (VITAMIN D3) 25 MCG (1000 UNIT) tablet Take 2,000 Units by mouth daily.    [provider]  clopidogrel  (PLAVIX ) 75 MG tablet Take 1 tablet by mouth once daily 11/22/22   Colene Dauphin, MD  cyclobenzaprine  (FLEXERIL ) 5 MG tablet Take 1 tablet (5 mg total) by mouth 3 (three) times daily as needed. for muscle spams 03/31/23   Colene Dauphin,  MD  DULoxetine  (CYMBALTA ) 30 MG capsule Take 1 capsule (30 mg total) by mouth daily. 07/24/23   Colene Dauphin, MD  FARXIGA  10 MG TABS tablet Take 1 tablet (10 mg total) by mouth daily. Take 10 mg by mouth daily. 03/21/23   Colene Dauphin, MD  ferrous sulfate  325 (65 FE) MG tablet Take 325 mg by mouth at bedtime.    [provider]  fluticasone  (FLONASE ) 50 MCG/ACT nasal spray Place 2 sprays into both nostrils daily. Patient taking differently: Place 2 sprays into both nostrils daily as needed for rhinitis or allergies. 11/06/21   Aleck Hurdle, MD  furosemide  (LASIX ) 20 MG tablet Take 1 tablet (20 mg total) by mouth daily as needed for fluid or edema. 03/06/23   Colene Dauphin, MD  HYDROcodone  bit-homatropine (HYCODAN) 5-1.5 MG/5ML syrup Take 5 mLs by mouth every 8 (eight) hours as needed for cough. 07/29/23   Desai, Nikita S, MD  metoprolol  tartrate (LOPRESSOR ) 25 MG tablet Take 1 tablet (25 mg total) by mouth 2 (two) times daily. May take extra 25 mg tablet for as needed palpitations 02/12/23   Macdonald Savoy, MD  montelukast  (SINGULAIR ) 10 MG tablet TAKE 1 TABLET BY MOUTH ONCE DAILY AT BEDTIME 07/25/23   Colene Dauphin, MD  pantoprazole  (PROTONIX ) 40 MG tablet Take 1 tablet (40 mg total) by mouth 2 (two) times daily before a meal. 02/01/23   Burns, Beckey Bourgeois, MD  rosuvastatin  (CRESTOR ) 40 MG tablet Take 1 tablet by mouth once daily 11/05/22   Turner, Rufus Council, MD  sodium chloride  (OCEAN) 0.65 % SOLN nasal spray Place 1 spray into both nostrils as needed for congestion. 07/29/23   Desai, Nikita S, MD  telmisartan  (MICARDIS ) 80 MG tablet Take 80 mg by mouth daily. 07/25/23   [provider]  tirzepatide Florence Hunt) 5 MG/0.5ML Pen Inject 5 mg into the skin once a week. 07/11/23   Colene Dauphin, MD    Family History    Family History  Problem Relation Age of Onset   Congestive Heart Failure Mother    Cancer Mother        ovarian   Heart disease Father    Diabetes Father    Kidney  disease Father    Diabetes Sister    Kidney disease Sister    Colon cancer Neg Hx    Colon polyps Neg Hx    Esophageal cancer Neg Hx    Rectal cancer Neg Hx    Stomach cancer Neg Hx    She indicated that the status of her mother is unknown. She indicated that the status of her father is unknown. She indicated that the status of her sister is unknown. She indicated that the status of her neg hx is unknown.  Social History    Social History   Socioeconomic History   Marital status: Married    Spouse name: Not on file   Number of children: 2   Years of education: Not on file   Highest education level: 12th grade  Occupational History   Occupation: Retired  Tobacco Use   Smoking status: Former    Current packs/day: 0.00    Average packs/day: 1 pack/day for 46.0 years (46.0 ttl pk-yrs)    Types: Cigarettes    Start date: 07/01/1963    Quit date: 06/30/2009    Years since quitting: 14.1    Passive exposure: Past   Smokeless tobacco: Never  Vaping Use   Vaping status: Never Used  Substance and Sexual Activity   Alcohol use: No   Drug use: No   Sexual activity: Not Currently  Other Topics Concern   Not on file  Social History Narrative   Married, retired   1 son 1 daughter   2 caffeine/day   2 story home, but doesn't have to use stairs - son stays upstairs   Social Drivers of Health   Financial Resource Strain: Low Risk  (06/04/2023)   Overall Financial Resource Strain (CARDIA)    Difficulty of Paying Living Expenses: Not hard at all  Food Insecurity: No Food Insecurity (06/04/2023)   Hunger Vital Sign    Worried About Running Out of Food in the Last Year: Never true    Ran Out of Food in the Last Year: Never true  Transportation Needs: No Transportation Needs (06/04/2023)   PRAPARE - Administrator, Civil Service (Medical): No    Lack of Transportation (Non-Medical): No  Physical Activity: Inactive (06/04/2023)   Exercise Vital Sign    Days of Exercise per Week:  0 days    Minutes of Exercise per Session: 40 min  Stress: Stress Concern Present (06/04/2023)   [employer] of Occupational Health - Occupational Stress Questionnaire    Feeling of Stress : To some extent  Social Connections: Socially Isolated (06/04/2023)   Social Connection and Isolation Panel [NHANES]    Frequency of Communication with Friends and Family: Once a week    Frequency of Social Gatherings with Friends and Family: Once a week    Attends Religious Services: Never    Database administrator or Organizations: No    Attends Banker Meetings: Never    Marital Status: Married  Catering manager Violence: Not At Risk (05/15/2023)   Humiliation, Afraid, Rape, and Kick questionnaire    Fear of Current or Ex-Partner: No    Emotionally Abused: No    Physically Abused: No    Sexually Abused: No     Review of  Systems    General:  No chills, fever, night sweats or weight changes.  Cardiovascular:  No chest pain, dyspnea on exertion, edema, orthopnea, palpitations, paroxysmal nocturnal dyspnea. Dermatological: No rash, lesions/masses Respiratory: No cough, dyspnea Urologic: No hematuria, dysuria Abdominal:   No nausea, vomiting, diarrhea, bright red blood per rectum, melena, or hematemesis Neurologic:  No visual changes, wkns, changes in mental status. All other systems reviewed and are otherwise negative except as noted above.  Physical Exam    VS:  BP 108/72   Pulse 96   Ht 5\' 5"  (1.651 m)   Wt 226 lb 6.4 oz (102.7 kg)   SpO2 93%   BMI 37.67 kg/m  , BMI Body mass index is 37.67 kg/m. GEN: Well nourished, well developed, in no acute distress. HEENT: normal. Neck: Supple, no JVD, carotid bruits, or masses. Cardiac: RRR, no murmurs, rubs, or gallops. No clubbing, cyanosis, edema.  Radials/DP/PT 2+ and equal bilaterally.  Respiratory:  Respirations regular and unlabored, clear to auscultation bilaterally. GI: Soft, nontender, nondistended, BS + x 4. MS: no  deformity or atrophy. Skin: warm and dry, no rash. Neuro:  Strength and sensation are intact. Psych: Normal affect.  Accessory Clinical Findings    Recent Labs: 02/11/2023: Magnesium 2.0 02/19/2023: B Natriuretic Peptide 88.9 06/05/2023: ALT 17; BUN 29; Creatinine, Ser 1.85; Hemoglobin 14.9; Platelets 267.0; Potassium 4.7; Sodium 141   Recent Lipid Panel    Component Value Date/Time   CHOL 119 06/05/2023 1520   CHOL 132 08/10/2019 1110   TRIG 101.0 06/05/2023 1520   HDL 41.90 06/05/2023 1520   HDL 35 (L) 08/10/2019 1110   CHOLHDL 3 06/05/2023 1520   VLDL 20.2 06/05/2023 1520   LDLCALC 57 06/05/2023 1520   LDLCALC 69 09/30/2019 1532   LDLDIRECT 79.0 08/01/2020 1336         ECG personally reviewed by me today- EKG Interpretation Date/Time:  Monday August 05 2023 14:13:19 EDT Ventricular Rate:  102 PR Interval:  190 QRS Duration:  82 QT Interval:  338 QTC Calculation: 440 R Axis:   55  Text Interpretation: Sinus tachycardia Confirmed by Lawana Pray [phone]) on 08/05/2023 2:46:59 PM    Echocardiogram 04/24/2018  IMPRESSIONS     1. The left ventricle has normal systolic function with an ejection  fraction of 60-65%. The cavity size was normal. There is mildly increased  left ventricular wall thickness. Left ventricular diastolic Doppler  parameters are consistent with impaired  relaxation Elevated left ventricular end-diastolic pressure The E/e' is  >15. No evidence of left ventricular regional wall motion abnormalities.   2. The right ventricle has normal systolic function. The cavity was  normal. There is no increase in right ventricular wall thickness.   3. Left atrial size was moderately dilated.   4. The mitral valve is normal in structure.   5. The aortic valve is normal in structure.   6. The aortic root and ascending aorta are normal in size and structure.   7. No evidence of left ventricular regional wall motion abnormalities.   SUMMARY    LVEF 60-65%,  mild LVH, normal wall motion, grade 1 DD, elevated LV  filling pressure, moderate LAE, trivial TR, normal RVSP 14 mmHg,  normal IVC   FINDINGS   Left Ventricle: The left ventricle has normal systolic function, with an  ejection fraction of 60-65%. The cavity size was normal. There is mildly  increased left ventricular wall thickness. Left ventricular diastolic  Doppler parameters are consistent  with impaired  relaxation Elevated left ventricular end-diastolic pressure  The E/e' is >15. No evidence of left ventricular regional wall motion  abnormalities..  Right Ventricle: The right ventricle has normal systolic function. The  cavity was normal. There is no increase in right ventricular wall  thickness.  Left Atrium: left atrial size was moderately dilated  Right Atrium: right atrial size was normal in size. Right atrial pressure  is estimated at 3 mmHg.  Interatrial Septum: No atrial level shunt detected by color flow Doppler.  Pericardium: There is no evidence of pericardial effusion.  Mitral Valve: The mitral valve is normal in structure. Mitral valve  regurgitation is trivial by color flow Doppler.  Tricuspid Valve: The tricuspid valve is not well visualized. Tricuspid  valve regurgitation is trivial by color flow Doppler.  Aortic Valve: The aortic valve is normal in structure. Aortic valve  regurgitation was not visualized by color flow Doppler. There is no  evidence of aortic valve stenosis.  Pulmonic Valve: The pulmonic valve was grossly normal. Pulmonic valve  regurgitation is not visualized by color flow Doppler.  Aorta: The aortic root and ascending aorta are normal in size and  structure.  Venous: The inferior vena cava is normal in size with greater than 50%  respiratory variability.   CT angio chest aorta 05/06/2020  CONTRAST:  60mL ISOVUE -370 IOPAMIDOL  (ISOVUE -370) INJECTION 76%   COMPARISON:  Prior CTA chest 04/20/2019   FINDINGS: Cardiovascular: 2 vessel arch  anatomy. The right brachiocephalic and left common carotid artery share a common origin. The aortic root, ascending, transverse and descending thoracic aorta remain normal in caliber. Similar appearance of extensive irregular ulcerated fibrofatty atherosclerotic plaque throughout the aortic isthmus and descending thoracic aorta. No significant interval progression. No evidence of dissection. Calcifications again noted throughout the coronary arteries. The heart is normal in size. No pericardial effusion. Unremarkable main pulmonary artery.   Mediastinum/Nodes: Unremarkable CT appearance of the thyroid  gland. No suspicious mediastinal or hilar adenopathy. No soft tissue mediastinal mass. The thoracic esophagus is unremarkable.   Lungs/Pleura: Stable biapical pleuroparenchymal scarring. Similar appearance of advanced centrilobular pulmonary emphysema.   Upper Abdomen: Stable chronic left renal atrophy. No acute abnormality within the upper abdomen.   Musculoskeletal: No acute fracture or aggressive appearing lytic or blastic osseous lesion.   Review of the MIP images confirms the above findings.   IMPRESSION: 1. No significant interval change in the appearance or extent of extensive, irregular and ulcerated fibrofatty atherosclerotic plaque throughout the aortic isthmus and descending thoracic aorta. No evidence of aortic aneurysm or dissection. 2. Stable biapical pleuroparenchymal scarring. 3. Coronary artery calcifications. 4. Stable chronic left renal atrophy. 5. Advanced centrilobular pulmonary emphysema.   Aortic Atherosclerosis (ICD10-I70.0) and Emphysema (ICD10-J43.9).   Signed,   Roxie Cord, MD, RPVI   [facility]   [facility]     Electronically Signed   By: Fernando Hoyer M.D.   On: 05/07/2020 12:17   Assessment & Plan   1.  Coronary artery disease-denies exertional chest discomfort.  Had cardiac  CTA 2021 which showed mid RCA 25-49% and ostial left main disease.  She was also noted to have proximal LAD and circumflex 0-24% disease.  Her stress test in 2020 showed no ischemia.  Stress test 03/09/2022 showed low risk and no ischemia.  No changes were noted when compared to previous study. Heart healthy low-sodium diet Increase physical activity as tolerated Continue rosuvastatin , amlodipine , metoprolol   Hyperlipidemia-LDL 57 on 06/05/2023. High-fiber diet Continue rosuvastatin   Essential hypertension-BP today 108/72. Maintain blood pressure log Low-sodium diet Continue amlodipine , metoprolol , telmisartan   Thoracic aortic aneurysm-CT angio chest aorta 05/06/2020 showed aortic root, ascending, transverse, and descending thoracic aorta normal in caliber  Chronic diastolic CHF-euvolemic today.  Denies weight gain.  Continues with baseline physical activity.  Echocardiogram 04/24/2018 showed LVEF of 60 to 65%, G1 DD, moderately dilated left atria and no other significant abnormalities. Continue current medical therapy Repeat echocardiogram when clinically indicated  Preop cardiac evaluation-Procedure:  LEFT TOTAL KNEE ARTHROPLASTY   Date of Surgery:  Clearance TBD                                  Surgeon:  DR. Marionette Sick Surgeon's Group or Practice Name:  [facility] Phone number:  [phone] MEGAN DAVIS Fax number:  [phone]      Primary Cardiologist: Gaylyn Keas, MD  Chart reviewed as part of pre-operative protocol coverage. Given past medical history and time since last visit, based on ACC/AHA guidelines, Kerry Powell would be at acceptable risk for the planned procedure without further cardiovascular testing.   Her RCRI is moderate risk, 6.6% risk of major cardiac event.  She is able to complete greater than 4 METS of physical activity.  Patient was advised that if she develops new symptoms prior to surgery to contact our office to arrange a follow-up appointment.   He verbalized understanding.  Patient's Plavix  is not prescribed by cardiology service.  Recommendations for holding Plavix  will need to come from prescribing provider.  I will route this recommendation to the requesting party via Epic fax function and remove from pre-op pool.   Disposition: Follow-up with Dr. Micael Adas or me in 9-12 months.   Chet Cota. Samael Blades NP-C     08/05/2023, 4:42 PM [facility] [address] Office [phone] Fax [phone]    I spent 15 minutes examining this patient, reviewing medications, and using patient centered shared decision making involving their cardiac care.   I spent  20 minutes reviewing past medical history,  medications, and prior cardiac tests.

## 2023-08-05 ENCOUNTER — Ambulatory Visit: Attending: General Practice | Admitting: General Practice

## 2023-08-05 ENCOUNTER — Encounter: Payer: Self-pay | Admitting: General Practice

## 2023-08-05 VITALS — BP 108/72 | HR 96 | Ht 65.0 in | Wt 226.4 lb

## 2023-08-05 DIAGNOSIS — Z01818 Encounter for other preprocedural examination: Secondary | ICD-10-CM | POA: Diagnosis not present

## 2023-08-05 DIAGNOSIS — I7123 Aneurysm of the descending thoracic aorta, without rupture: Secondary | ICD-10-CM

## 2023-08-05 DIAGNOSIS — I2510 Atherosclerotic heart disease of native coronary artery without angina pectoris: Secondary | ICD-10-CM

## 2023-08-05 DIAGNOSIS — E7800 Pure hypercholesterolemia, unspecified: Secondary | ICD-10-CM | POA: Diagnosis not present

## 2023-08-05 DIAGNOSIS — I10 Essential (primary) hypertension: Secondary | ICD-10-CM

## 2023-08-05 DIAGNOSIS — I5032 Chronic diastolic (congestive) heart failure: Secondary | ICD-10-CM

## 2023-08-05 NOTE — Patient Instructions (Addendum)
 Medication Instructions:  NO CHANGES *If you need a refill on your cardiac medications before your next appointment, please call your pharmacy*  Lab Work: NO LABS If you have labs (blood work) drawn today and your tests are completely normal, you will receive your results only by: MyChart Message (if you have MyChart) OR A paper copy in the mail If you have any lab test that is abnormal or we need to change your treatment, we will call you to review the results.  Testing/Procedures: NO TESTING  Follow-Up: At [facility], you and your health needs are our priority.  As part of our continuing mission to provide you with exceptional heart care, our providers are all part of one team.  This team includes your primary Cardiologist (physician) and Advanced Practice Providers or APPs (Physician Assistants and Nurse Practitioners) who all work together to provide you with the care you need, when you need it.  Your next appointment:   9-12 month(s)  Provider:   Gaylyn Keas, MD  or Lawana Pray, NP

## 2023-08-07 ENCOUNTER — Encounter: Payer: Self-pay | Admitting: Internal Medicine

## 2023-08-08 ENCOUNTER — Institutional Professional Consult (permissible substitution) (INDEPENDENT_AMBULATORY_CARE_PROVIDER_SITE_OTHER): Admitting: Otolaryngology

## 2023-08-08 MED ORDER — AMOXICILLIN-POT CLAVULANATE 875-125 MG PO TABS: 1.0000 | ORAL_TABLET | Freq: Two times a day (BID) | ORAL | 0 refills | Status: AC

## 2023-08-08 NOTE — Addendum Note (Signed)
 Addended by: Colene Dauphin on: 08/08/2023 05:18 PM   Modules accepted: Orders

## 2023-08-09 ENCOUNTER — Other Ambulatory Visit: Payer: Self-pay | Admitting: Internal Medicine

## 2023-08-09 NOTE — Telephone Encounter (Unsigned)
 Copied from CRM [id number]. Topic: Clinical - Medication Refill >> Aug 09, 2023  4:00 PM Tanazia G wrote: Medication: HYDROcodone  bit-homatropine (HYCODAN) 5-1.5 MG/5ML syrup  Has the patient contacted their pharmacy? Yes (Agent: If no, request that the patient contact the pharmacy for the refill. If patient does not wish to contact the pharmacy document the reason why and proceed with request.) (Agent: If yes, when and what did the pharmacy advise?)  This is the patient's preferred pharmacy:  [facility] [address] ([address]), [place] - [address] [place] ([place]) [name] [address] Phone: [phone] Fax: [phone]   Is this the correct pharmacy for this prescription? Yes If no, delete pharmacy and type the correct one.   Has the prescription been filled recently? Yes  Is the patient out of the medication? Yes  Has the patient been seen for an appointment in the last year OR does the patient have an upcoming appointment? Yes  Can we respond through MyChart? Yes  Agent: Please be advised that Rx refills may take up to 3 business days. We ask that you follow-up with your pharmacy.

## 2023-08-10 ENCOUNTER — Other Ambulatory Visit: Payer: Self-pay

## 2023-08-10 ENCOUNTER — Encounter (HOSPITAL_BASED_OUTPATIENT_CLINIC_OR_DEPARTMENT_OTHER): Payer: Self-pay | Admitting: Emergency Medicine

## 2023-08-10 ENCOUNTER — Emergency Department (HOSPITAL_BASED_OUTPATIENT_CLINIC_OR_DEPARTMENT_OTHER)

## 2023-08-10 ENCOUNTER — Emergency Department (HOSPITAL_BASED_OUTPATIENT_CLINIC_OR_DEPARTMENT_OTHER)
Admission: EM | Admit: 2023-08-10 | Discharge: 2023-08-10 | Disposition: A | Discharge status: Home / Self Care | Attending: Emergency Medicine | Admitting: Emergency Medicine

## 2023-08-10 DIAGNOSIS — W108XXA Fall (on) (from) other stairs and steps, initial encounter: Secondary | ICD-10-CM | POA: Diagnosis not present

## 2023-08-10 DIAGNOSIS — Z7902 Long term (current) use of antithrombotics/antiplatelets: Secondary | ICD-10-CM | POA: Diagnosis not present

## 2023-08-10 DIAGNOSIS — T148XXA Other injury of unspecified body region, initial encounter: Secondary | ICD-10-CM

## 2023-08-10 DIAGNOSIS — S8012XA Contusion of left lower leg, initial encounter: Secondary | ICD-10-CM | POA: Diagnosis not present

## 2023-08-10 DIAGNOSIS — S8992XA Unspecified injury of left lower leg, initial encounter: Secondary | ICD-10-CM | POA: Diagnosis present

## 2023-08-10 NOTE — Discharge Instructions (Signed)
 Follow up with your doctor in the office.  Return for worsening pain, numbness

## 2023-08-10 NOTE — ED Provider Notes (Signed)
 [place] [facility] Provider Note   CSN: [id number] Arrival date & time: 08/10/23  1947     Patient presents with: Kerry Powell   Kerry Powell is a 78 y.o. female.   78 yo F with a cc of a fall.  Patient thinks she stood up to fast when getting out of bed.  Occurred this morning.  Pain and swelling to left lateral aspect of the leg.  Pain worse with bearing weight.  Patient worried about blood clot to the leg.  Here for eval. has had chronic swelling to the legs since she broke her foot some years ago.   Fall       Prior to Admission medications   Medication Sig Start Date End Date Taking? Authorizing Provider  albuterol  (PROVENTIL ) (2.5 MG/3ML) 0.083% nebulizer solution Take 3 mLs (2.5 mg total) by nebulization every 6 (six) hours as needed for wheezing or shortness of breath. 11/06/21   Aleck Hurdle, MD  allopurinol  (ZYLOPRIM ) 300 MG tablet Take 1 tablet by mouth once daily 06/11/23   Colene Dauphin, MD  amLODipine  (NORVASC ) 5 MG tablet TAKE 1 & 1/2 (ONE & ONE-HALF) TABLETS BY MOUTH ONCE DAILY 07/24/23   Colene Dauphin, MD  amoxicillin -clavulanate (AUGMENTIN ) 875-125 MG tablet Take 1 tablet by mouth 2 (two) times daily for 10 days. 08/08/23 08/18/23  Colene Dauphin, MD  cetirizine  (ZYRTEC  ALLERGY) 10 MG tablet Take 10 mg by mouth daily as needed for allergies or rhinitis. 05/06/19   [provider]  cholecalciferol (VITAMIN D3) 25 MCG (1000 UNIT) tablet Take 2,000 Units by mouth daily.    [provider]  clopidogrel  (PLAVIX ) 75 MG tablet Take 1 tablet by mouth once daily 11/22/22   Colene Dauphin, MD  cyclobenzaprine  (FLEXERIL ) 5 MG tablet Take 1 tablet (5 mg total) by mouth 3 (three) times daily as needed. for muscle spams 03/31/23   Colene Dauphin, MD  DULoxetine  (CYMBALTA ) 30 MG capsule Take 1 capsule (30 mg total) by mouth daily. 07/24/23   Colene Dauphin, MD  FARXIGA  10 MG TABS tablet Take 1 tablet (10 mg total) by mouth daily. Take  10 mg by mouth daily. 03/21/23   Colene Dauphin, MD  ferrous sulfate  325 (65 FE) MG tablet Take 325 mg by mouth at bedtime.    [provider]  fluticasone  (FLONASE ) 50 MCG/ACT nasal spray Place 2 sprays into both nostrils daily. Patient taking differently: Place 2 sprays into both nostrils daily as needed for rhinitis or allergies. 11/06/21   Aleck Hurdle, MD  furosemide  (LASIX ) 20 MG tablet Take 1 tablet (20 mg total) by mouth daily as needed for fluid or edema. 03/06/23   Colene Dauphin, MD  HYDROcodone  bit-homatropine (HYCODAN) 5-1.5 MG/5ML syrup Take 5 mLs by mouth every 8 (eight) hours as needed for cough. 07/29/23   Desai, Nikita S, MD  metoprolol  tartrate (LOPRESSOR ) 25 MG tablet Take 1 tablet (25 mg total) by mouth 2 (two) times daily. May take extra 25 mg tablet for as needed palpitations 02/12/23   Macdonald Savoy, MD  montelukast  (SINGULAIR ) 10 MG tablet TAKE 1 TABLET BY MOUTH ONCE DAILY AT BEDTIME 07/25/23   Colene Dauphin, MD  pantoprazole  (PROTONIX ) 40 MG tablet Take 1 tablet (40 mg total) by mouth 2 (two) times daily before a meal. 02/01/23   Burns, Beckey Bourgeois, MD  rosuvastatin  (CRESTOR ) 40 MG tablet Take 1 tablet by mouth once daily  11/05/22   Jacqueline Matsu, MD  sodium chloride  (OCEAN) 0.65 % SOLN nasal spray Place 1 spray into both nostrils as needed for congestion. 07/29/23   Desai, Nikita S, MD  telmisartan  (MICARDIS ) 80 MG tablet Take 80 mg by mouth daily. 07/25/23   [provider]  tirzepatide  (MOUNJARO ) 5 MG/0.5ML Pen Inject 5 mg into the skin once a week. 07/11/23   Colene Dauphin, MD    Allergies: Gabapentin, Clindamycin , and Sulfa antibiotics    Review of Systems  Updated Vital Signs BP 112/70   Pulse 92   Temp 98.6 F (37 C) (Oral)   Resp 16   Ht 5' 5 (1.651 m)   Wt 102.6 kg   SpO2 94%   BMI 37.64 kg/m   Physical Exam Vitals and nursing note reviewed.  Constitutional:      General: She is not in acute distress.    Appearance: She is  well-developed. She is not diaphoretic.  HENT:     Head: Normocephalic and atraumatic.   Eyes:     Pupils: Pupils are equal, round, and reactive to light.    Cardiovascular:     Rate and Rhythm: Normal rate and regular rhythm.     Heart sounds: No murmur heard.    No friction rub. No gallop.  Pulmonary:     Effort: Pulmonary effort is normal.     Breath sounds: No wheezing or rales.  Abdominal:     General: There is no distension.     Palpations: Abdomen is soft.     Tenderness: There is no abdominal tenderness.   Musculoskeletal:        General: Swelling and tenderness present.     Cervical back: Normal range of motion and neck supple.     Comments: Pain and swelling to the left lateral aspects of the leg with a hematoma.  Pulse motor and sensation intact distally.   Skin:    General: Skin is warm and dry.   Neurological:     Mental Status: She is alert and oriented to person, place, and time.   Psychiatric:        Behavior: Behavior normal.     (all labs ordered are listed, but only abnormal results are displayed) Labs Reviewed - No data to display  EKG: None  Radiology: DG Tibia/Fibula Left Result Date: 08/10/2023 CLINICAL DATA:  Kerry Powell, pain EXAM: LEFT TIBIA AND FIBULA - 2 VIEW; LEFT KNEE - COMPLETE 4+ VIEW COMPARISON:  02/19/2023 FINDINGS: Left knee: Frontal, bilateral oblique, lateral views of the left knee are obtained. No fracture, subluxation, or dislocation. 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 No joint effusion. Large loose body within the popliteal fossa unchanged. Left tibia/fibula: Frontal and lateral views are obtained. No acute displaced fracture. Alignment of the left knee and ankle is anatomic. Moderate osteoarthritis of the left ankle. Prominent soft tissue swelling within the anterolateral aspect of the mid left lower leg. IMPRESSION: 1. Soft tissue swelling anteriorly and laterally within the left lower leg. 2. No acute  displaced fracture. 3. 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 Electronically Signed   By: Bobbye Burrow M.D.   On: 08/10/2023 20:51   DG Knee Complete 4 Views Left Result Date: 08/10/2023 CLINICAL DATA:  Kerry Powell, pain EXAM: LEFT TIBIA AND FIBULA - 2 VIEW; LEFT KNEE - COMPLETE 4+ VIEW COMPARISON:  02/19/2023 FINDINGS: Left knee: Frontal, bilateral oblique, lateral views of the left knee are obtained. No fracture, subluxation, or dislocation. Stable  moderate 3 compartmental osteoarthritis greatest medially. No joint effusion. Large loose body within the popliteal fossa unchanged. Left tibia/fibula: Frontal and lateral views are obtained. No acute displaced fracture. Alignment of the left knee and ankle is anatomic. Moderate osteoarthritis of the left ankle. Prominent soft tissue swelling within the anterolateral aspect of the mid left lower leg. IMPRESSION: 1. Soft tissue swelling anteriorly and laterally within the left lower leg. 2. No acute displaced fracture. 3. 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 Electronically Signed   By: Bobbye Burrow M.D.   On: 08/10/2023 20:51     Procedures   Medications Ordered in the ED - No data to display                                  Medical Decision Making Amount and/or Complexity of Data Reviewed Radiology: ordered.   78 yo  F with a chief complaints of left leg pain and swelling.  Clinically the patient has a hematoma.  Will obtain a plain film to assess for fracture.  Patient was worried about a possible clot to that leg.  Plain film of the left tib-fib independently interpreted by me without fracture.  Plain film of the left knee independently interpreted by me without fracture.  Will discharge home.  PCP follow-up.  10:00 PM:  I have discussed the diagnosis/risks/treatment options with the patient and family.  Evaluation and diagnostic testing in the emergency department does not suggest an emergent condition requiring admission or immediate  intervention beyond what has been performed at this time.  They will follow up with PCP. We also discussed returning to the ED immediately if new or worsening sx occur. We discussed the sx which are most concerning (e.g., sudden worsening pain, fever, inability to tolerate by mouth) that necessitate immediate return. Medications administered to the patient during their visit and any new prescriptions provided to the patient are listed below.  Medications given during this visit Medications - No data to display   The patient appears reasonably screen and/or stabilized for discharge and I doubt any other medical condition or other [facility] requiring further screening, evaluation, or treatment in the ED at this time prior to discharge.       Final diagnoses:  Hematoma    ED Discharge Orders     None          Albertus Hughs, DO 08/10/23 2200

## 2023-08-10 NOTE — ED Triage Notes (Addendum)
 Pt reports she fell onto the bottom step of staircase this morning; hematoma noted to RT lower leg; sts the back of her leg hurts more; takes Plavix ; denies hitting head

## 2023-08-12 MED ORDER — HYDROCODONE BIT-HOMATROP MBR 5-1.5 MG/5ML PO SOLN: 5.0000 mL | Freq: Three times a day (TID) | ORAL | 0 refills | Status: DC | PRN

## 2023-08-14 ENCOUNTER — Ambulatory Visit: Admitting: Internal Medicine

## 2023-08-22 ENCOUNTER — Encounter: Payer: Self-pay | Admitting: Internal Medicine

## 2023-08-26 ENCOUNTER — Ambulatory Visit: Admitting: Internal Medicine

## 2023-08-26 ENCOUNTER — Encounter: Payer: Self-pay | Admitting: Internal Medicine

## 2023-08-26 VITALS — BP 108/72 | HR 103 | Temp 98.3°F | Ht 65.0 in

## 2023-08-26 DIAGNOSIS — I10 Essential (primary) hypertension: Secondary | ICD-10-CM

## 2023-08-26 DIAGNOSIS — S8012XA Contusion of left lower leg, initial encounter: Secondary | ICD-10-CM | POA: Insufficient documentation

## 2023-08-26 MED ORDER — TELMISARTAN 40 MG PO TABS: 40.0000 mg | ORAL_TABLET | Freq: Every day | ORAL | 2 refills | Status: DC

## 2023-08-26 MED ORDER — TIRZEPATIDE 5 MG/0.5ML ~~LOC~~ SOAJ: 5.0000 mg | SUBCUTANEOUS | 0 refills | Status: DC

## 2023-08-26 NOTE — Assessment & Plan Note (Signed)
 Acute Related to fall 6/14 Has some erythema, warm but no evidence of infection Apply heat, elevate leg when sitting Take tylenol  3000 mg / day If pain is not controlled will send in tramadol  since not able to take nsaids Monitor - call with concerns

## 2023-08-26 NOTE — Assessment & Plan Note (Signed)
 Chronic Blood pressure has been low on a few occasions and she has had syncope a few times Decrease telmisartan  to 40 mg daily Continue amlodipine  7.5 mg daily, metoprolol  25 mg twice daily Monitor BP at home

## 2023-08-26 NOTE — Patient Instructions (Addendum)
      Medications changes include :   decrease telmisartan  to 40 mg daily      Return if symptoms worsen or fail to improve.

## 2023-08-26 NOTE — Progress Notes (Signed)
 Subjective:    Patient ID: DEEPTI Powell, female    DOB: January 06, 1946, 78 y.o.   MRN: [id number]      HPI Meline is here for  Chief Complaint  Patient presents with   Leg Swelling    Left leg swelling and bruising (concerned for infection)   Abdominal Pain    Abdominal pain noted this morning with cramping (like childbirth cramps)    6/14 got up from bed and looked up and blacked out and fell backwards.  She thinks she was lightheaded.  She hit her left leg and it was swollen - she pushed down on it.  She was seen in the ED.  It remains swollen,red, warm and very tender.     Last night ate [place].  She started having abdominal pain - cramping at 3am - had to pull the stool out because it was large.  She then had some loser stool.  She still has some discomfort.  She did take imodium.  Her pain is better and she thinks it is related to the [place].    Medications and allergies reviewed with patient and updated if appropriate.  Current Outpatient Medications on File Prior to Visit  Medication Sig Dispense Refill   albuterol  (PROVENTIL ) (2.5 MG/3ML) 0.083% nebulizer solution Take 3 mLs (2.5 mg total) by nebulization every 6 (six) hours as needed for wheezing or shortness of breath. 75 mL 12   allopurinol  (ZYLOPRIM ) 300 MG tablet Take 1 tablet by mouth once daily 90 tablet 0   amLODipine  (NORVASC ) 5 MG tablet TAKE 1 & 1/2 (ONE & ONE-HALF) TABLETS BY MOUTH ONCE DAILY 135 tablet 1   cetirizine  (ZYRTEC  ALLERGY) 10 MG tablet Take 10 mg by mouth daily as needed for allergies or rhinitis.     cholecalciferol (VITAMIN D3) 25 MCG (1000 UNIT) tablet Take 2,000 Units by mouth daily.     clopidogrel  (PLAVIX ) 75 MG tablet Take 1 tablet by mouth once daily 90 tablet 0   cyclobenzaprine  (FLEXERIL ) 5 MG tablet Take 1 tablet (5 mg total) by mouth 3 (three) times daily as needed. for muscle spams 270 tablet 1   DULoxetine  (CYMBALTA ) 30 MG capsule Take 1 capsule (30 mg total) by mouth daily. 90  capsule 1   FARXIGA  10 MG TABS tablet Take 1 tablet (10 mg total) by mouth daily. Take 10 mg by mouth daily. 90 tablet 3   ferrous sulfate  325 (65 FE) MG tablet Take 325 mg by mouth at bedtime.     fluticasone  (FLONASE ) 50 MCG/ACT nasal spray Place 2 sprays into both nostrils daily. (Patient taking differently: Place 2 sprays into both nostrils daily as needed for rhinitis or allergies.) 11.1 mL 11   furosemide  (LASIX ) 20 MG tablet Take 1 tablet (20 mg total) by mouth daily as needed for fluid or edema.     HYDROcodone  bit-homatropine (HYCODAN) 5-1.5 MG/5ML syrup Take 5 mLs by mouth every 8 (eight) hours as needed for cough. 120 mL 0   metoprolol  tartrate (LOPRESSOR ) 25 MG tablet Take 1 tablet (25 mg total) by mouth 2 (two) times daily. May take extra 25 mg tablet for as needed palpitations 60 tablet 3   montelukast  (SINGULAIR ) 10 MG tablet TAKE 1 TABLET BY MOUTH ONCE DAILY AT BEDTIME 30 tablet 5   pantoprazole  (PROTONIX ) 40 MG tablet Take 1 tablet (40 mg total) by mouth 2 (two) times daily before a meal. 180 tablet 1   rosuvastatin  (CRESTOR ) 40 MG tablet Take 1  tablet by mouth once daily 90 tablet 1   sodium chloride  (OCEAN) 0.65 % SOLN nasal spray Place 1 spray into both nostrils as needed for congestion. 480 mL 1   telmisartan  (MICARDIS ) 80 MG tablet Take 80 mg by mouth daily.     tirzepatide  (MOUNJARO ) 5 MG/0.5ML Pen Inject 5 mg into the skin once a week. 2 mL 0   No current facility-administered medications on file prior to visit.    Review of Systems     Objective:   Vitals:   08/26/23 1632  BP: 108/72  Pulse: (!) 103  Temp: 98.3 F (36.8 C)  SpO2: 94%   BP Readings from Last 3 Encounters:  08/26/23 108/72  08/10/23 112/70  08/05/23 108/72   Wt Readings from Last 3 Encounters:  08/10/23 226 lb 3.1 oz (102.6 kg)  08/05/23 226 lb 6.4 oz (102.7 kg)  07/29/23 226 lb 9.6 oz (102.8 kg)   Body mass index is 37.64 kg/m.    Physical Exam Constitutional:      General: She is  not in acute distress.    Appearance: She is well-developed. She is not ill-appearing.  HENT:     Head: Normocephalic and atraumatic.   Musculoskeletal:     Comments: Left middle lower leg with large hematoma with mild erythema, warmth and tenderness.  There is an abrasion that is from this morning - her dog.  No distal leg bruising.  Minimal edema in lower leg.     Neurological:     Mental Status: She is alert.            Assessment & Plan:    See Problem List for Assessment and Plan of chronic medical problems.

## 2023-08-31 ENCOUNTER — Other Ambulatory Visit: Payer: Self-pay | Admitting: Cardiology

## 2023-08-31 ENCOUNTER — Other Ambulatory Visit: Payer: Self-pay | Admitting: Internal Medicine

## 2023-09-03 ENCOUNTER — Other Ambulatory Visit (HOSPITAL_COMMUNITY): Payer: Self-pay

## 2023-09-03 ENCOUNTER — Telehealth: Payer: Self-pay

## 2023-09-03 NOTE — Telephone Encounter (Signed)
 Pharmacy Patient Advocate Encounter   Received notification from Onbase that prior authorization for Cyclobenzaprine  5mg  tabs is required/requested.   Insurance verification completed.   The patient is insured through [facility] .   Per test claim: PA required; PA submitted to above mentioned insurance via CoverMyMeds Key/confirmation #/EOC [id number] Status is pending

## 2023-09-04 NOTE — Telephone Encounter (Signed)
 Pharmacy Patient Advocate Encounter  Received notification from [employer] that Prior Authorization for Cyclobenzaprine  5mg  tabs  has been DENIED.  Full denial letter will be uploaded to the media tab. See denial reason below.   PA #/Case ID/Reference #: [id number]

## 2023-09-04 NOTE — Telephone Encounter (Signed)
 The cyclobenzaprine  is being used to treat muscle spasms.   Per the insurance, the pa cannot be resubmitted, it will have to be appealed. They said to fill out the appeals form and attach any supporting documents.   Correct fax# to send to: [phone]

## 2023-09-05 ENCOUNTER — Telehealth: Payer: Self-pay | Admitting: Pharmacist

## 2023-09-05 NOTE — Telephone Encounter (Signed)
 An E-Appeal has been submitted for cyclobenzaprine . Will advise when response is received, please be advised that most companies may take 30 days to make a decision. Appeal letter and supporting documentation have been uploaded and submitted via the [facility] website.  Thank you, [name] Pandy, PharmD Clinical Pharmacist  [place]  Direct Dial: [phone]

## 2023-09-06 NOTE — Telephone Encounter (Signed)
 Additional information has been requested from the patient's insurance in order to proceed with the appeal request. Requested information has been sent, or form has been filled out and faxed back to [phone]

## 2023-09-10 ENCOUNTER — Other Ambulatory Visit (HOSPITAL_COMMUNITY): Payer: Self-pay

## 2023-09-10 NOTE — Telephone Encounter (Signed)
 Appeal for cyclobenzaprine  has been approved by the insurance:    Thank you, [name] Pandy, PharmD Clinical Pharmacist  [place]  Direct Dial: [phone]

## 2023-09-10 NOTE — Telephone Encounter (Signed)
 Appeal was started and approved.

## 2023-09-23 ENCOUNTER — Other Ambulatory Visit: Payer: Self-pay | Admitting: Internal Medicine

## 2023-09-24 ENCOUNTER — Other Ambulatory Visit: Payer: Self-pay | Admitting: Internal Medicine

## 2023-09-26 ENCOUNTER — Other Ambulatory Visit: Payer: Self-pay | Admitting: Internal Medicine

## 2023-10-03 ENCOUNTER — Ambulatory Visit (INDEPENDENT_AMBULATORY_CARE_PROVIDER_SITE_OTHER): Admitting: Otolaryngology

## 2023-10-03 ENCOUNTER — Encounter (INDEPENDENT_AMBULATORY_CARE_PROVIDER_SITE_OTHER): Payer: Self-pay | Admitting: Otolaryngology

## 2023-10-03 VITALS — BP 102/69 | HR 81

## 2023-10-03 DIAGNOSIS — R0981 Nasal congestion: Secondary | ICD-10-CM

## 2023-10-03 DIAGNOSIS — R490 Dysphonia: Secondary | ICD-10-CM | POA: Diagnosis not present

## 2023-10-03 DIAGNOSIS — Z87891 Personal history of nicotine dependence: Secondary | ICD-10-CM | POA: Diagnosis not present

## 2023-10-03 DIAGNOSIS — R0982 Postnasal drip: Secondary | ICD-10-CM

## 2023-10-03 DIAGNOSIS — J383 Other diseases of vocal cords: Secondary | ICD-10-CM

## 2023-10-03 DIAGNOSIS — K219 Gastro-esophageal reflux disease without esophagitis: Secondary | ICD-10-CM

## 2023-10-03 DIAGNOSIS — J3089 Other allergic rhinitis: Secondary | ICD-10-CM

## 2023-10-03 MED ORDER — FLUTICASONE PROPIONATE 50 MCG/ACT NA SUSP: 2.0000 | Freq: Every day | NASAL | 6 refills | Status: AC

## 2023-10-03 MED ORDER — LEVOCETIRIZINE DIHYDROCHLORIDE 5 MG PO TABS: 5.0000 mg | ORAL_TABLET | Freq: Every evening | ORAL | 3 refills | Status: DC

## 2023-10-03 NOTE — Progress Notes (Signed)
 ENT CONSULT:  Reason for Consult: hoarseness x 1 year and mucus in the throat from post-nasal drainage   HPI: Discussed the use of AI scribe software for clinical note transcription with the patient, who gave verbal consent to proceed.  History of Present Illness Kerry Powell is a 78 year old female with chronic nasal congestion and drainage, GERD who presents with hoarseness and voice changes.  She has experienced progressive hoarseness and voice changes over the past year. There is no history of recent illness or surgeries involving the head and neck. She experiences significant postnasal drainage, described as a sensation of it 'sticking' in the throat, necessitating frequent throat clearing.  She has a history of sinus problems, previously misdiagnosed as a brain tumor due to an unusual presentation. This was later identified as a sinus issue with overflow into the sphenoid cavity, treated with amoxicillin . She did not require surgery.  She has a history of reflux and is currently taking omeprazole . Despite this, she experiences frequent episodes of choking while eating. She questions the continued efficacy of omeprazole .    Records Reviewed:  PCP visit with Dr Geofm 08/26/23 Seen for f/u for hematoma   ED visit 08/10/23 78 yo F with a cc of a fall. Patient thinks she stood up to fast when getting out of bed. Occurred this morning. Pain and swelling to left lateral aspect of the leg. Pain worse with bearing weight. Patient worried about blood clot to the leg. Here for eval. has had chronic swelling to the legs since she broke her foot some years ago    Past Medical History:  Diagnosis Date   Allergy    Anemia    Anxiety    Arthritis    BACK PAIN, CHRONIC 04/04/2007   Qualifier: Diagnosis of  By: [name], [name]    BIPOLAR AFFECTIVE DISORDER 04/04/2007   Qualifier: History of  By: [name], [name] E    CAD (coronary artery disease), native coronary artery    mild CAD  with no obstructive disease by coronary CTA 06/2019   Cataract    removed both eyes   Chronic neck pain 04/30/2015   Cervical spondylosis [facility] Intermittent numbness and tingling in occipital region and headaches S/p 2 occipital nerve blocks - only temp relief no relief with gabapentin, ultram , brace MRI neck - facet arthrosis    CKD (chronic kidney disease) 04/26/2015   COPD (chronic obstructive pulmonary disease) (HCC)    pt denies - told this while was smoking -   Cough 04/26/2015   Depression 07/01/2017   Diabetes (HCC) 04/27/2015   Diagnosed 03/2015    Dizziness 06/10/2017   Dysphagia 04/26/2015   DYSPNEA 12/24/2007   Qualifier: Diagnosis of  By: [name]     Elevated TSH 08/09/2015   GASTRIC ULCER, ACUTE 04/07/2007   Annotation: EGD 04/07/07 Qualifier: Diagnosis of  By: [name], [name] E    GASTRITIS 04/07/2007   Annotation: EGD 04/07/07 Qualifier: Diagnosis of  By: [name], [name] E    GERD (gastroesophageal reflux disease)    H/O renal cell carcinoma 05/23/2017   Dx 2010, s/p cryoablation Follows with urology - Dr [name]   HIATAL HERNIA 04/07/2007   Annotation: EGD 04/07/07 Qualifier: Diagnosis of  By: [name], [name] E    History of CVA (cerebrovascular accident) 04/26/2015   History of gout 04/26/2015   Hyperlipidemia    Hypertension    Hypoxia 03/06/2023  Irritable bowel syndrome 04/04/2007   Qualifier: Diagnosis of  By: [name], [name]    Kidney tumor (benign)    Nausea and vomiting 09/09/2017   Occipital neuralgia 05/09/2016   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 Dr [name] in [place]   OSTEOARTHRITIS 04/04/2007   Qualifier: Diagnosis of  By: [name], [name]    Personal history of colonic adenoma 06/07/2012   POSTNASAL DRIP SYNDROME 12/29/2007   Qualifier: Diagnosis of  By: [name], [name]     RESTLESS LEG SYNDROME 04/04/2007   Qualifier: Diagnosis of  By: [name], [name]    RHINOSINUSITIS, 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    Qualifier: Diagnosis of  By: [name], [name]    Sleep difficulties 09/09/2017   Snoring 04/26/2015   Stroke (HCC)    x2 2011   Thoracic aortic aneurysm (HCC) 02/06/2018   Seen on MRI 01/25/18 of thoracic spine ordered by [facility] -- referred to CTS to monitor  -- 3.5 cm   TIA (transient ischemic attack)    TOBACCO ABUSE 12/17/2007   Qualifier: Diagnosis of  By: [name], [name]     Trigeminal neuralgia of right side of face 02/08/2016   URI (upper respiratory infection) 02/08/2016   Vitamin D  deficiency 03/12/2018    Past Surgical History:  Procedure Laterality Date   ABDOMINAL HYSTERECTOMY     APPENDECTOMY     CATARACT EXTRACTION     COLONOSCOPY     CYSTOCELE REPAIR     ESOPHAGOGASTRODUODENOSCOPY     KIDNEY SURGERY     benign tumor removal   ORTHOPEDIC SURGERY     Wrist & elbow   POLYPECTOMY     RECTOCELE REPAIR     TONSILECTOMY, ADENOIDECTOMY, BILATERAL MYRINGOTOMY AND TUBES Bilateral 1968   UPPER GASTROINTESTINAL ENDOSCOPY      Family History  Problem Relation Age of Onset   Congestive Heart Failure Mother    Cancer Mother        ovarian   Heart disease Father    Diabetes Father    Kidney disease Father    Diabetes Sister    Kidney disease Sister    Colon cancer Neg Hx    Colon polyps Neg Hx    Esophageal cancer Neg Hx    Rectal cancer Neg Hx    Stomach cancer Neg Hx     Social History:  reports that she quit smoking about 14 years ago. Her smoking use included cigarettes. She started smoking about 60 years ago. She has a 46 pack-year smoking history. She has been exposed to tobacco smoke. She has never used smokeless tobacco. She reports that she does not drink alcohol and does not use drugs.  Allergies:  Allergies  Allergen Reactions   Gabapentin Other (See Comments)    Fluid retention    Clindamycin  Rash   Sulfa Antibiotics Nausea And Vomiting    Medications: I have reviewed the patient's current medications.  The PMH, PSH,  Medications, Allergies, and SH were reviewed and updated.  ROS: Constitutional: Negative for fever, weight loss and weight gain. Cardiovascular: Negative for chest pain and dyspnea on exertion. Respiratory: Is not experiencing shortness of breath at rest. Gastrointestinal: Negative for nausea and vomiting. Neurological: Negative for headaches. Psychiatric: The patient is not nervous/anxious  Blood pressure 102/69, pulse 81, SpO2 91%. There is no height or weight on file to calculate BMI.  PHYSICAL EXAM:  Exam: General: Well-developed, well-nourished Communication and Voice: raspy Respiratory Respiratory effort:  Equal inspiration and expiration without stridor Cardiovascular Peripheral Vascular: Warm extremities with equal color/perfusion Eyes: No nystagmus with equal extraocular motion bilaterally Neuro/Psych/Balance: Patient oriented to person, place, and time; Appropriate mood and affect; Gait is intact with no imbalance; Cranial nerves I-XII are intact Head and Face Inspection: Normocephalic and atraumatic without mass or lesion Palpation: Facial skeleton intact without bony stepoffs Salivary Glands: No mass or tenderness Facial Strength: Facial motility symmetric and full bilaterally ENT Pinna: External ear intact and fully developed External canal: Canal is patent with intact skin Tympanic Membrane: Clear and mobile External Nose: No scar or anatomic deformity Internal Nose: Septum is deviated to the left. No polyp, or purulence. Mucosal edema and erythema present.  Bilateral inferior turbinate hypertrophy.  Lips, Teeth, and gums: Mucosa and teeth intact and viable TMJ: No pain to palpation with full mobility Oral cavity/oropharynx: No erythema or exudate, no lesions present Nasopharynx: No mass or lesion with intact mucosa Hypopharynx: Intact mucosa without pooling of secretions Larynx Glottic: Full true vocal cord mobility without lesion or mass Supraglottic: Normal  appearing epiglottis and AE folds Interarytenoid Space: Moderate pachydermia&edema Subglottic Space: Patent without lesion or edema Neck Neck and Trachea: Midline trachea without mass or lesion Thyroid : No mass or nodularity Lymphatics: No lymphadenopathy  Procedure: Preoperative diagnosis: dysphonia   Postoperative diagnosis:   Same + GERD LPR and VF atrophy   Procedure: Flexible fiberoptic laryngoscopy  Surgeon: Elena Larry, MD  Anesthesia: Topical lidocaine  and Afrin Complications: None Condition is stable throughout exam  Indications and consent:  The patient presents to the clinic with above symptoms. Indirect laryngoscopy view was incomplete. Thus it was recommended that they undergo a flexible fiberoptic laryngoscopy. All of the risks, benefits, and potential complications were reviewed with the patient preoperatively and verbal informed consent was obtained.  Procedure: The patient was seated upright in the clinic. Topical lidocaine  and Afrin were applied to the nasal cavity. After adequate anesthesia had occurred, I then proceeded to pass the flexible telescope into the nasal cavity. The nasal cavity was patent without rhinorrhea or polyp. The nasopharynx was also patent without mass or lesion. The base of tongue was visualized and was normal. There were no signs of pooling of secretions in the piriform sinuses. The true vocal folds were mobile bilaterally. There were no signs of glottic or supraglottic mucosal lesion or mass. There was moderate interarytenoid pachydermia and post cricoid edema. The telescope was then slowly withdrawn and the patient tolerated the procedure throughout.    Studies Reviewed: CT head 01/21/22 MPRESSION: 1. No acute intracranial abnormality or acute traumatic injury identified. 2. Chronic small vessel ischemia with some evidence of progression since the 2019 MRI.  Assessment/Plan: No diagnosis found.  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  - Refer to speech therapy for voice optimization and breath support. - Educated on managing postnasal drainage and reflux to improve voice quality. - Discussed elective nature of speech therapy due to caregiving responsibilities. She will consider   Chronic postnasal drainage and nasal congestion  Chronic nasal congestion and post-nasal drainage Evidence of post-nasal drainage during flexible scope exam today, could be contributing to her sx - trial of Xyzal  5 mg daily and Flonase  2 puffs b/l nares BID - consider nasal saline rinses   Gastroesophageal reflux disease (GERD) - continue Omeprazole    -  Reflux Gourmet after meals -  diet and lifestyle changes to minimize GERD - Refer to [employer] blog for dietary and lifestyle modifications/reflux cook book   Thank you for allowing me to participate in the care of this patient. Please do not hesitate to contact me with any questions or concerns.   Elena Larry, MD Otolaryngology [facility] Phone: [phone] Fax: [phone]    10/03/2023, 3:33 PM

## 2023-10-03 NOTE — Patient Instructions (Signed)
 Lloyd Huger Med Nasal Saline Rinse   - start nasal saline rinses with NeilMed Bottle available over the counter or online to help with nasal congestion     [email or web address] - check out this website to learn more about reflux   -Avoid lying down for at least two hours after a meal or after drinking acidic beverages, like soda, or other caffeinated beverages. This can help to prevent stomach contents from flowing back into the esophagus. -Keep your head elevated while you sleep. Using an extra pillow or two can also help to prevent reflux. -Eat smaller and more frequent meals each day instead of a few large meals. This promotes digestion and can aid in preventing heartburn. -Wear loose-fitting clothes to ease pressure on the stomach, which can worsen heartburn and reflux. -Reduce excess weight around the midsection. This can ease pressure on the stomach. Such pressure can force some stomach contents back up the esophagus  - Take Reflux Gourmet (natural supplement available on [employer]) to help with symptoms of chronic throat irritation

## 2023-10-06 ENCOUNTER — Emergency Department (HOSPITAL_COMMUNITY)

## 2023-10-06 ENCOUNTER — Encounter (HOSPITAL_COMMUNITY): Payer: Self-pay | Admitting: *Deleted

## 2023-10-06 ENCOUNTER — Other Ambulatory Visit: Payer: Self-pay

## 2023-10-06 ENCOUNTER — Inpatient Hospital Stay (HOSPITAL_COMMUNITY)

## 2023-10-06 ENCOUNTER — Inpatient Hospital Stay (HOSPITAL_COMMUNITY)
Admission: EM | Admit: 2023-10-06 | Discharge: 2023-10-13 | DRG: 853 | Disposition: A | Discharge status: Home Health | Attending: Internal Medicine | Admitting: Internal Medicine

## 2023-10-06 DIAGNOSIS — I110 Hypertensive heart disease with heart failure: Secondary | ICD-10-CM | POA: Diagnosis not present

## 2023-10-06 DIAGNOSIS — I130 Hypertensive heart and chronic kidney disease with heart failure and stage 1 through stage 4 chronic kidney disease, or unspecified chronic kidney disease: Secondary | ICD-10-CM | POA: Diagnosis present

## 2023-10-06 DIAGNOSIS — Z8711 Personal history of peptic ulcer disease: Secondary | ICD-10-CM

## 2023-10-06 DIAGNOSIS — Z7902 Long term (current) use of antithrombotics/antiplatelets: Secondary | ICD-10-CM

## 2023-10-06 DIAGNOSIS — T380X5A Adverse effect of glucocorticoids and synthetic analogues, initial encounter: Secondary | ICD-10-CM | POA: Diagnosis not present

## 2023-10-06 DIAGNOSIS — E1152 Type 2 diabetes mellitus with diabetic peripheral angiopathy with gangrene: Secondary | ICD-10-CM | POA: Diagnosis present

## 2023-10-06 DIAGNOSIS — E1122 Type 2 diabetes mellitus with diabetic chronic kidney disease: Secondary | ICD-10-CM | POA: Diagnosis present

## 2023-10-06 DIAGNOSIS — M65062 Abscess of tendon sheath, left lower leg: Secondary | ICD-10-CM | POA: Diagnosis not present

## 2023-10-06 DIAGNOSIS — Z833 Family history of diabetes mellitus: Secondary | ICD-10-CM

## 2023-10-06 DIAGNOSIS — R8281 Pyuria: Secondary | ICD-10-CM | POA: Diagnosis present

## 2023-10-06 DIAGNOSIS — L02416 Cutaneous abscess of left lower limb: Secondary | ICD-10-CM | POA: Diagnosis not present

## 2023-10-06 DIAGNOSIS — Z7985 Long-term (current) use of injectable non-insulin antidiabetic drugs: Secondary | ICD-10-CM | POA: Diagnosis not present

## 2023-10-06 DIAGNOSIS — L97229 Non-pressure chronic ulcer of left calf with unspecified severity: Secondary | ICD-10-CM | POA: Diagnosis present

## 2023-10-06 DIAGNOSIS — D72828 Other elevated white blood cell count: Secondary | ICD-10-CM | POA: Diagnosis not present

## 2023-10-06 DIAGNOSIS — E871 Hypo-osmolality and hyponatremia: Secondary | ICD-10-CM | POA: Diagnosis present

## 2023-10-06 DIAGNOSIS — A419 Sepsis, unspecified organism: Principal | ICD-10-CM | POA: Diagnosis present

## 2023-10-06 DIAGNOSIS — R6520 Severe sepsis without septic shock: Secondary | ICD-10-CM | POA: Diagnosis present

## 2023-10-06 DIAGNOSIS — J441 Chronic obstructive pulmonary disease with (acute) exacerbation: Secondary | ICD-10-CM | POA: Diagnosis present

## 2023-10-06 DIAGNOSIS — Z87891 Personal history of nicotine dependence: Secondary | ICD-10-CM

## 2023-10-06 DIAGNOSIS — G2581 Restless legs syndrome: Secondary | ICD-10-CM | POA: Diagnosis present

## 2023-10-06 DIAGNOSIS — N1832 Chronic kidney disease, stage 3b: Secondary | ICD-10-CM | POA: Diagnosis present

## 2023-10-06 DIAGNOSIS — E66812 Obesity, class 2: Secondary | ICD-10-CM | POA: Diagnosis present

## 2023-10-06 DIAGNOSIS — F319 Bipolar disorder, unspecified: Secondary | ICD-10-CM | POA: Diagnosis present

## 2023-10-06 DIAGNOSIS — Z66 Do not resuscitate: Secondary | ICD-10-CM | POA: Diagnosis present

## 2023-10-06 DIAGNOSIS — Z85528 Personal history of other malignant neoplasm of kidney: Secondary | ICD-10-CM

## 2023-10-06 DIAGNOSIS — E119 Type 2 diabetes mellitus without complications: Secondary | ICD-10-CM

## 2023-10-06 DIAGNOSIS — E860 Dehydration: Secondary | ICD-10-CM | POA: Diagnosis present

## 2023-10-06 DIAGNOSIS — E785 Hyperlipidemia, unspecified: Secondary | ICD-10-CM | POA: Diagnosis present

## 2023-10-06 DIAGNOSIS — K219 Gastro-esophageal reflux disease without esophagitis: Secondary | ICD-10-CM | POA: Diagnosis present

## 2023-10-06 DIAGNOSIS — J9601 Acute respiratory failure with hypoxia: Secondary | ICD-10-CM | POA: Diagnosis present

## 2023-10-06 DIAGNOSIS — Z79899 Other long term (current) drug therapy: Secondary | ICD-10-CM

## 2023-10-06 DIAGNOSIS — I2510 Atherosclerotic heart disease of native coronary artery without angina pectoris: Secondary | ICD-10-CM | POA: Diagnosis present

## 2023-10-06 DIAGNOSIS — Z881 Allergy status to other antibiotic agents status: Secondary | ICD-10-CM

## 2023-10-06 DIAGNOSIS — Z8249 Family history of ischemic heart disease and other diseases of the circulatory system: Secondary | ICD-10-CM

## 2023-10-06 DIAGNOSIS — Z6838 Body mass index (BMI) 38.0-38.9, adult: Secondary | ICD-10-CM

## 2023-10-06 DIAGNOSIS — I5032 Chronic diastolic (congestive) heart failure: Secondary | ICD-10-CM | POA: Diagnosis present

## 2023-10-06 DIAGNOSIS — Z604 Social exclusion and rejection: Secondary | ICD-10-CM | POA: Diagnosis present

## 2023-10-06 DIAGNOSIS — N184 Chronic kidney disease, stage 4 (severe): Secondary | ICD-10-CM | POA: Diagnosis present

## 2023-10-06 DIAGNOSIS — L03116 Cellulitis of left lower limb: Secondary | ICD-10-CM | POA: Diagnosis present

## 2023-10-06 DIAGNOSIS — Z7984 Long term (current) use of oral hypoglycemic drugs: Secondary | ICD-10-CM

## 2023-10-06 DIAGNOSIS — E1165 Type 2 diabetes mellitus with hyperglycemia: Secondary | ICD-10-CM | POA: Diagnosis present

## 2023-10-06 DIAGNOSIS — Z841 Family history of disorders of kidney and ureter: Secondary | ICD-10-CM

## 2023-10-06 DIAGNOSIS — Z8673 Personal history of transient ischemic attack (TIA), and cerebral infarction without residual deficits: Secondary | ICD-10-CM

## 2023-10-06 DIAGNOSIS — Z882 Allergy status to sulfonamides status: Secondary | ICD-10-CM

## 2023-10-06 DIAGNOSIS — R339 Retention of urine, unspecified: Secondary | ICD-10-CM | POA: Diagnosis present

## 2023-10-06 DIAGNOSIS — Z90710 Acquired absence of both cervix and uterus: Secondary | ICD-10-CM

## 2023-10-06 DIAGNOSIS — Z888 Allergy status to other drugs, medicaments and biological substances status: Secondary | ICD-10-CM

## 2023-10-06 LAB — COMPREHENSIVE METABOLIC PANEL WITH GFR
ALT: 12 U/L (ref 0–44)
AST: 16 U/L (ref 15–41)
Albumin: 3.5 g/dL (ref 3.5–5.0)
Alkaline Phosphatase: 93 U/L (ref 38–126)
Anion gap: 12 (ref 5–15)
BUN: 21 mg/dL (ref 8–23)
CO2: 19 mmol/L — ABNORMAL LOW (ref 22–32)
Calcium: 9.1 mg/dL (ref 8.9–10.3)
Chloride: 99 mmol/L (ref 98–111)
Creatinine, Ser: 1.93 mg/dL — ABNORMAL HIGH (ref 0.44–1.00)
GFR, Estimated: 26 mL/min — ABNORMAL LOW (ref >60)
Glucose, Bld: 140 mg/dL — ABNORMAL HIGH (ref 70–99)
Potassium: 4.3 mmol/L (ref 3.5–5.1)
Sodium: 130 mmol/L — ABNORMAL LOW (ref 135–145)
Total Bilirubin: 0.9 mg/dL (ref 0.0–1.2)
Total Protein: 7.1 g/dL (ref 6.5–8.1)

## 2023-10-06 LAB — BRAIN NATRIURETIC PEPTIDE: B Natriuretic Peptide: 92.7 pg/mL (ref 0.0–100.0)

## 2023-10-06 LAB — PROCALCITONIN: Procalcitonin: 0.13 ng/mL

## 2023-10-06 LAB — CBC WITH DIFFERENTIAL/PLATELET
Abs Immature Granulocytes: 0.15 K/uL — ABNORMAL HIGH (ref 0.00–0.07)
Basophils Absolute: 0.1 K/uL (ref 0.0–0.1)
Basophils Relative: 0 %
Eosinophils Absolute: 0.1 K/uL (ref 0.0–0.5)
Eosinophils Relative: 0 %
HCT: 49.6 % — ABNORMAL HIGH (ref 36.0–46.0)
Hemoglobin: 15.9 g/dL — ABNORMAL HIGH (ref 12.0–15.0)
Immature Granulocytes: 1 %
Lymphocytes Relative: 8 %
Lymphs Abs: 1.7 K/uL (ref 0.7–4.0)
MCH: 31.1 pg (ref 26.0–34.0)
MCHC: 32.1 g/dL (ref 30.0–36.0)
MCV: 97.1 fL (ref 80.0–100.0)
Monocytes Absolute: 2 K/uL — ABNORMAL HIGH (ref 0.1–1.0)
Monocytes Relative: 10 %
Neutro Abs: 16.6 K/uL — ABNORMAL HIGH (ref 1.7–7.7)
Neutrophils Relative %: 81 %
Platelets: 287 K/uL (ref 150–400)
RBC: 5.11 MIL/uL (ref 3.87–5.11)
RDW: 14 % (ref 11.5–15.5)
WBC: 20.6 K/uL — ABNORMAL HIGH (ref 4.0–10.5)
nRBC: 0 % (ref 0.0–0.2)

## 2023-10-06 LAB — I-STAT CG4 LACTIC ACID, ED: Lactic Acid, Venous: 1.5 mmol/L (ref 0.5–1.9)

## 2023-10-06 LAB — PROTIME-INR
INR: 1 (ref 0.8–1.2)
Prothrombin Time: 13.7 s (ref 11.4–15.2)

## 2023-10-06 LAB — MAGNESIUM: Magnesium: 1.7 mg/dL (ref 1.7–2.4)

## 2023-10-06 LAB — CBG MONITORING, ED: Glucose-Capillary: 110 mg/dL — ABNORMAL HIGH (ref 70–99)

## 2023-10-06 MED ORDER — ONDANSETRON HCL 4 MG/2ML IJ SOLN: 4.0000 mg | Freq: Four times a day (QID) | INTRAMUSCULAR | Status: DC | PRN

## 2023-10-06 MED ORDER — INSULIN ASPART 100 UNIT/ML IJ SOLN
0.0000 [IU] | Freq: Three times a day (TID) | INTRAMUSCULAR | Status: DC
  Administered 2023-10-07 (×4): 1 [IU] via SUBCUTANEOUS
  Administered 2023-10-08 (×2): 3 [IU] via SUBCUTANEOUS
  Administered 2023-10-08 (×2): 2 [IU] via SUBCUTANEOUS
  Administered 2023-10-10: 5 [IU] via SUBCUTANEOUS
  Administered 2023-10-10: 2 [IU] via SUBCUTANEOUS
  Administered 2023-10-11: 1 [IU] via SUBCUTANEOUS
  Administered 2023-10-11 – 2023-10-12 (×3): 2 [IU] via SUBCUTANEOUS

## 2023-10-06 MED ORDER — VANCOMYCIN HCL IN DEXTROSE 1-5 GM/200ML-% IV SOLN
1000.0000 mg | Freq: Once | INTRAVENOUS | Status: AC
  Administered 2023-10-06: 1000 mg via INTRAVENOUS
  Filled 2023-10-06: qty 200

## 2023-10-06 MED ORDER — PANTOPRAZOLE SODIUM 40 MG IV SOLR
40.0000 mg | INTRAVENOUS | Status: DC
  Administered 2023-10-06 – 2023-10-07 (×3): 40 mg via INTRAVENOUS
  Filled 2023-10-06 (×2): qty 10

## 2023-10-06 MED ORDER — FENTANYL CITRATE PF 50 MCG/ML IJ SOSY: 12.5000 ug | PREFILLED_SYRINGE | INTRAMUSCULAR | Status: DC | PRN

## 2023-10-06 MED ORDER — ACETAMINOPHEN 325 MG PO TABS
650.0000 mg | ORAL_TABLET | Freq: Four times a day (QID) | ORAL | Status: DC | PRN
  Administered 2023-10-07 (×2): 650 mg via ORAL
  Filled 2023-10-06: qty 2

## 2023-10-06 MED ORDER — SODIUM CHLORIDE 0.9 % IV SOLN
1.0000 g | INTRAVENOUS | Status: DC
  Administered 2023-10-07 – 2023-10-08 (×4): 1 g via INTRAVENOUS
  Filled 2023-10-06 (×3): qty 10

## 2023-10-06 MED ORDER — LACTATED RINGERS IV BOLUS (SEPSIS)
1000.0000 mL | Freq: Once | INTRAVENOUS | Status: AC
  Administered 2023-10-06: 1000 mL via INTRAVENOUS

## 2023-10-06 MED ORDER — ONDANSETRON HCL 4 MG/2ML IJ SOLN
4.0000 mg | Freq: Once | INTRAMUSCULAR | Status: AC
  Administered 2023-10-06: 4 mg via INTRAVENOUS
  Filled 2023-10-06: qty 2

## 2023-10-06 MED ORDER — HYDROMORPHONE HCL 1 MG/ML IJ SOLN
0.5000 mg | Freq: Once | INTRAMUSCULAR | Status: AC
  Administered 2023-10-06: 0.5 mg via INTRAVENOUS
  Filled 2023-10-06: qty 1

## 2023-10-06 MED ORDER — ACETAMINOPHEN 325 MG PO TABS
ORAL_TABLET | ORAL | Status: AC
  Filled 2023-10-06: qty 2

## 2023-10-06 MED ORDER — HEPARIN SODIUM (PORCINE) 5000 UNIT/ML IJ SOLN
5000.0000 [IU] | Freq: Three times a day (TID) | INTRAMUSCULAR | Status: DC
  Administered 2023-10-06 – 2023-10-13 (×27): 5000 [IU] via SUBCUTANEOUS
  Filled 2023-10-06 (×20): qty 1

## 2023-10-06 MED ORDER — ACETAMINOPHEN 325 MG PO TABS
650.0000 mg | ORAL_TABLET | Freq: Once | ORAL | Status: AC
  Administered 2023-10-06: 650 mg via ORAL

## 2023-10-06 MED ORDER — SODIUM CHLORIDE 0.9 % IV SOLN
2.0000 g | Freq: Once | INTRAVENOUS | Status: AC
  Administered 2023-10-06: 2 g via INTRAVENOUS
  Filled 2023-10-06: qty 20

## 2023-10-06 MED ORDER — ACETAMINOPHEN 650 MG RE SUPP
650.0000 mg | Freq: Four times a day (QID) | RECTAL | Status: DC | PRN
Start: 2023-10-06 — End: 2023-10-13

## 2023-10-06 MED ORDER — LACTATED RINGERS IV SOLN: INTRAVENOUS | Status: AC

## 2023-10-06 MED ORDER — MELATONIN 3 MG PO TABS: 3.0000 mg | ORAL_TABLET | Freq: Every evening | ORAL | Status: DC | PRN

## 2023-10-06 MED ORDER — INSULIN ASPART 100 UNIT/ML IJ SOLN
0.0000 [IU] | Freq: Every day | INTRAMUSCULAR | Status: DC
  Administered 2023-10-08 (×2): 4 [IU] via SUBCUTANEOUS
  Administered 2023-10-09 (×2): 3 [IU] via SUBCUTANEOUS
  Administered 2023-10-11: 2 [IU] via SUBCUTANEOUS

## 2023-10-06 MED ORDER — NALOXONE HCL 0.4 MG/ML IJ SOLN: 0.4000 mg | INTRAMUSCULAR | Status: DC | PRN

## 2023-10-06 NOTE — ED Notes (Signed)
 I attempted to place an xray in for the leg but it would not allow me to order it

## 2023-10-06 NOTE — ED Triage Notes (Signed)
 The pt fel 4-5 weeks ago and injured lt lower leg laterally just below her lt knee  the leg has been red and the lesion that she initially had has been scabbed over until today   this am the area opened and there is purulent drainage  oozing today  she has had chills and fever with much pain

## 2023-10-06 NOTE — ED Provider Notes (Signed)
 [place] EMERGENCY DEPARTMENT AT [facility] Provider Note   CSN: [id number] Arrival date & time: 10/06/23  8147     Patient presents with: leg abscess   Kerry Powell is a 78 y.o. female.   78 year old female with a history of CAD, CKD, COPD, and diabetes presents emergency department with left lower extremity pain and fever.  Patient reports that for 5 weeks ago she hit her left shin on a step.  Says that since then she has had a scab.  Says that last night started becoming more painful and the scab came off and pus started coming from the wound.  Also started having fevers and chills.  Has not been on antibiotics yet.       Prior to Admission medications   Medication Sig Start Date End Date Taking? Authorizing Provider  albuterol  (PROVENTIL ) (2.5 MG/3ML) 0.083% nebulizer solution USE 1 VIAL IN NEBULIZER EVERY 6 HOURS AND AS NEEDED FOR SHORT BREATH/WHEEZE.  Generic:  Ventolin  09/30/23  Yes Meade Verdon RAMAN, MD  allopurinol  (ZYLOPRIM ) 300 MG tablet Take 1 tablet by mouth once daily 09/02/23  Yes Burns, Glade PARAS, MD  amLODipine  (NORVASC ) 5 MG tablet TAKE 1 & 1/2 (ONE & ONE-HALF) TABLETS BY MOUTH ONCE DAILY Patient taking differently: Take 15 mg by mouth daily. TAKE 1 & 1/2 (ONE & ONE-HALF) TABLETS BY MOUTH ONCE DAILY 07/24/23  Yes Burns, Glade PARAS, MD  cetirizine  (ZYRTEC  ALLERGY) 10 MG tablet Take 10 mg by mouth daily as needed for allergies or rhinitis. 05/06/19  Yes [provider]  cholecalciferol (VITAMIN D3) 25 MCG (1000 UNIT) tablet Take 2,000 Units by mouth daily.   Yes [provider]  clopidogrel  (PLAVIX ) 75 MG tablet Take 1 tablet by mouth once daily 09/02/23  Yes Burns, Glade PARAS, MD  cyclobenzaprine  (FLEXERIL ) 5 MG tablet Take 1 tablet (5 mg total) by mouth 3 (three) times daily as needed. for muscle spams 03/31/23  Yes Burns, Glade PARAS, MD  DULoxetine  (CYMBALTA ) 30 MG capsule Take 1 capsule (30 mg total) by mouth daily. Patient taking differently: Take 60 mg  by mouth daily. 07/24/23  Yes Burns, Glade PARAS, MD  FARXIGA  10 MG TABS tablet Take 1 tablet (10 mg total) by mouth daily. Take 10 mg by mouth daily. 03/21/23  Yes Burns, Glade PARAS, MD  ferrous sulfate  325 (65 FE) MG tablet Take 325 mg by mouth every other day.   Yes [provider]  fluticasone  (FLONASE ) 50 MCG/ACT nasal spray Place 2 sprays into both nostrils daily. Patient taking differently: Place 2 sprays into both nostrils daily as needed for allergies. 10/03/23  Yes Soldatova, Liuba, MD  furosemide  (LASIX ) 20 MG tablet Take 1 tablet (20 mg total) by mouth daily as needed for fluid or edema. 03/06/23  Yes Burns, Glade PARAS, MD  HYDROcodone  bit-homatropine (HYCODAN) 5-1.5 MG/5ML syrup Take 5 mLs by mouth every 8 (eight) hours as needed for cough. 08/12/23  Yes Burns, Glade PARAS, MD  metoprolol  tartrate (LOPRESSOR ) 25 MG tablet Take 1 tablet (25 mg total) by mouth 2 (two) times daily. May take extra 25 mg tablet for as needed palpitations 02/12/23  Yes Odell Celinda Balo, MD  montelukast  (SINGULAIR ) 10 MG tablet TAKE 1 TABLET BY MOUTH ONCE DAILY AT BEDTIME 07/25/23  Yes Burns, Glade PARAS, MD  MOUNJARO  5 MG/0.5ML Pen INJECT 1/2 (ONE-HALF) ML SUBCUTANEOUSLY  ONCE A WEEK 09/23/23  Yes Burns, Glade PARAS, MD  pantoprazole  (PROTONIX ) 40 MG tablet Take 1 tablet (40  mg total) by mouth 2 (two) times daily before a meal. 02/01/23  Yes Burns, Glade PARAS, MD  rosuvastatin  (CRESTOR ) 40 MG tablet Take 1 tablet by mouth once daily 09/02/23  Yes Turner, Wilbert SAUNDERS, MD  sodium chloride  (OCEAN) 0.65 % SOLN nasal spray Place 1 spray into both nostrils as needed for congestion. 07/29/23  Yes Meade Verdon RAMAN, MD  telmisartan  (MICARDIS ) 40 MG tablet Take 1 tablet (40 mg total) by mouth daily. 08/26/23  Yes Burns, Glade PARAS, MD  traMADol  (ULTRAM ) 50 MG tablet Take 50 mg by mouth 3 (three) times daily as needed. for pain 09/18/23  Yes [provider]  levocetirizine (XYZAL  ALLERGY 24HR) 5 MG tablet Take 1 tablet (5 mg total) by mouth every  evening. Patient not taking: Reported on 10/06/2023 10/03/23   Okey Burns, MD    Allergies: Gabapentin, Clindamycin , and Sulfa antibiotics    Review of Systems  Updated Vital Signs BP (!) 116/97   Pulse 89   Temp 100.3 F (37.9 C) (Oral)   Resp 20   Ht 5' 5 (1.651 m)   Wt 102.6 kg   SpO2 96%   BMI 37.64 kg/m   Physical Exam Constitutional:      Appearance: Normal appearance.  Cardiovascular:     Rate and Rhythm: Regular rhythm. Tachycardia present.  Musculoskeletal:     Comments: Ulcerated lesion of the left lateral shin approximately 2 cm in diameter.  Purulent material able to be expressed from it.  No fluctuance palpated beneath it but does have erythema and induration.  No subcutaneous emphysema.  No significant pain past the edges of erythema  Neurological:     Mental Status: She is alert.     (all labs ordered are listed, but only abnormal results are displayed) Labs Reviewed  COMPREHENSIVE METABOLIC PANEL WITH GFR - Abnormal; Notable for the following components:      Result Value   Sodium 130 (*)    CO2 19 (*)    Glucose, Bld 140 (*)    Creatinine, Ser 1.93 (*)    GFR, Estimated 26 (*)    All other components within normal limits  CBC WITH DIFFERENTIAL/PLATELET - Abnormal; Notable for the following components:   WBC 20.6 (*)    Hemoglobin 15.9 (*)    HCT 49.6 (*)    Neutro Abs 16.6 (*)    Monocytes Absolute 2.0 (*)    Abs Immature Granulocytes 0.15 (*)    All other components within normal limits  CULTURE, BLOOD (ROUTINE X 2)  CULTURE, BLOOD (ROUTINE X 2)  PROTIME-INR  URINALYSIS, W/ REFLEX TO CULTURE (INFECTION SUSPECTED)  CBC WITH DIFFERENTIAL/PLATELET  COMPREHENSIVE METABOLIC PANEL WITH GFR  MAGNESIUM  MAGNESIUM  PHOSPHORUS  BRAIN NATRIURETIC PEPTIDE  PROCALCITONIN  HEMOGLOBIN A1C  I-STAT CG4 LACTIC ACID, ED    EKG: None  Radiology: DG Chest 2 View Result Date: 10/06/2023 CLINICAL DATA:  Hypoxia EXAM: CHEST - 2 VIEW COMPARISON:   Chest x-ray 02/11/2023 FINDINGS: The heart size and mediastinal contours are within normal limits. Both lungs are clear. The visualized skeletal structures are unremarkable. IMPRESSION: No active cardiopulmonary disease. Electronically Signed   By: Greig Pique M.D.   On: 10/06/2023 22:44   DG Tibia/Fibula Left Result Date: 10/06/2023 CLINICAL DATA:  Lower extremity pain.  Potential for osteomyelitis. EXAM: LEFT TIBIA AND FIBULA - 2 VIEW COMPARISON:  Study of 08/10/2023. FINDINGS: AP Lat two-view exam obtained in 4 films, again reveals generalized edema in the mid and distal foreleg  beginning in the mid calf continuing inferiorly and greater laterally. In the anterolateral mid foreleg there is a focally more prominent edema with 2 cm subcutaneous air pocket or soft tissue defect not seen previously. There is no radiopaque foreign body. No evidence of fractures or acute osteomyelitis. There is moderate arthrosis at the left knee and left ankle. Enthesopathic changes of the anterior patella. There is a popcorn like calcification again noted in the popliteal fossa which would either represent a calcified popliteal lymph node or osteochondral loose body such as within the posterior joint space or in a popliteal cyst. There are patchy calcifications in the posterior tibial artery. IMPRESSION: 1. Generalized edema in the mid and distal foreleg, greater laterally. 2. Focally more prominent edema in the anterolateral mid foreleg with 2 cm subcutaneous air pocket or soft tissue defect not seen previously. 3. No evidence of fractures or acute osteomyelitis. 4. Moderate arthrosis at the left knee and left ankle. 5. Calcified popliteal lymph node versus osteochondral loose body in the posterior joint space or in a popliteal cyst. Electronically Signed   By: Francis Quam M.D.   On: 10/06/2023 21:02     Procedures  EMERGENCY DEPARTMENT US  SOFT TISSUE INTERPRETATION Study: Limited Soft Tissue  Ultrasound  INDICATIONS: Pain and Soft tissue infection Multiple views of the body part were obtained in real-time with a multi-frequency linear probe  PERFORMED BY: Myself IMAGES ARCHIVED?: Yes SIDE:Left BODY PART:Lower extremity INTERPRETATION:  No abcess noted, Cellulitis present, and no subcutaneous air.  Cobblestoning noted.    Medications Ordered in the ED  acetaminophen  (TYLENOL ) tablet 650 mg (has no administration in time range)    Or  acetaminophen  (TYLENOL ) suppository 650 mg (has no administration in time range)  heparin  injection 5,000 Units (has no administration in time range)  melatonin tablet 3 mg (has no administration in time range)  ondansetron  (ZOFRAN ) injection 4 mg (has no administration in time range)  cefTRIAXone  (ROCEPHIN ) 1 g in sodium chloride  0.9 % 100 mL IVPB (has no administration in time range)  lactated ringers  infusion (has no administration in time range)  insulin  aspart (novoLOG ) injection 0-9 Units (has no administration in time range)  insulin  aspart (novoLOG ) injection 0-5 Units (has no administration in time range)  acetaminophen  (TYLENOL ) tablet 650 mg (650 mg Oral Given 10/06/23 1914)  lactated ringers  bolus 1,000 mL (0 mLs Intravenous Stopped 10/06/23 2245)  cefTRIAXone  (ROCEPHIN ) 2 g in sodium chloride  0.9 % 100 mL IVPB (0 g Intravenous Stopped 10/06/23 2120)  vancomycin  (VANCOCIN ) IVPB 1000 mg/200 mL premix (0 mg Intravenous Stopped 10/06/23 2244)  HYDROmorphone  (DILAUDID ) injection 0.5 mg (0.5 mg Intravenous Given 10/06/23 2123)  ondansetron  (ZOFRAN ) injection 4 mg (4 mg Intravenous Given 10/06/23 2122)    Clinical Course as of 10/06/23 2251  Sun Oct 06, 2023  2024 Creatinine(!): 1.93 Baseline 1.8 [RP]  2214 Dr Doc consulted for admission [RP]    Clinical Course User Index [RP] Yolande Lamar BROCKS, MD                                 Medical Decision Making Amount and/or Complexity of Data Reviewed Labs:  Decision-making details  documented in ED Course. Radiology: ordered.  Risk Prescription drug management. Decision regarding hospitalization.   KATHYRN WARMUTH is a 78 y.o. female with comorbidities that complicate the patient evaluation including CAD, CKD, COPD, and diabetes presents emergency department with left lower extremity pain  and fever.    Initial Ddx:  Sepsis, cellulitis, necrotizing fasciitis, abscess, osteomyelitis  MDM/Course:  Patient presents the emergency department with a wound to her left shin.  Has surrounding erythema.  Does have drainage from it.  On exam she is borderline febrile and tachycardic.  Does have an area of cellulitis with purulent drainage from her leg.  No signs of necrotizing fasciitis at this point in time.  Has an elevated white blood cell count on her lab work 2.  Sepsis protocol was initiated.  She was started on ceftriaxone  and vancomycin  for her cellulitis.  X-ray was obtained did not show any free air concerning for necrotizing fasciitis or signs of osteomyelitis.  Bedside ultrasound did not show any abscess to be drained.  Admitted to hospitalist for further management.    This patient presents to the ED for concern of complaints listed in HPI, this involves an extensive number of treatment options, and is a complaint that carries with it a high risk of complications and morbidity. Disposition including potential need for admission considered.   Dispo: Admit to Floor  Additional history obtained from son Records reviewed Outpatient Clinic Notes The following labs were independently interpreted: Chemistry and show CKD I independently reviewed the following imaging with scope of interpretation limited to determining acute life threatening conditions related to emergency care: Extremity x-ray(s) and agree with the radiologist interpretation with the following exceptions: none I personally reviewed and interpreted cardiac monitoring: sinus tachycardia I personally reviewed  and interpreted the pt's EKG: see above for interpretation  I have reviewed the patients home medications and made adjustments as needed Consults: Hospitalist Social Determinants of health:  Geriatric  Portions of this note were generated with Scientist, clinical (histocompatibility and immunogenetics). Dictation errors may occur despite best attempts at proofreading.    CRITICAL CARE Performed by: Lamar JAYSON Shan   Total critical care time: 30 minutes  Critical care time was exclusive of separately billable procedures and treating other patients.  Critical care was necessary to treat or prevent imminent or life-threatening deterioration.  Critical care was time spent personally by me on the following activities: development of treatment plan with patient and/or surrogate as well as nursing, discussions with consultants, evaluation of patient's response to treatment, examination of patient, obtaining history from patient or surrogate, ordering and performing treatments and interventions, ordering and review of laboratory studies, ordering and review of radiographic studies, pulse oximetry and re-evaluation of patient's condition.   Final diagnoses:  Sepsis, due to unspecified organism, unspecified whether acute organ dysfunction present [facility])  Cellulitis of left lower extremity    ED Discharge Orders     None          Shan Lamar JAYSON, MD 10/06/23 2252

## 2023-10-06 NOTE — ED Notes (Signed)
 Pt unable to void at this time.  KM

## 2023-10-06 NOTE — Progress Notes (Signed)
 Pt being followed by [facility] for Sepsis protocol.

## 2023-10-06 NOTE — H&P (Signed)
 History and Physical      Kerry Powell FMW:[id number] DOB: August 05, 1945 DOA: 10/06/2023; DOS: 10/06/2023  PCP: Geofm Glade PARAS, MD  Patient coming from: home   I have personally briefly reviewed patient's old medical records in [facility]  Chief Complaint: Left lower extremity pain  HPI: Kerry Powell is a 78 y.o. 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   The patient conveys that she struck the anterior aspect of her left lower extremity distal to the left knee approximately 3 weeks ago, following which she developed a scab.  Approximately 5 days ago the associated scab fell off.  Subsequent to the scapula, she has noted progressive left lower extremity pain, erythema, swelling, as well as development of purulent drainage.  This has been associated with to 3 days of subjective fever and chills in the absence of full by rigors or generalized myalgias.  Denies any associated acute focal weakness or acute focal numbness/paresthesias involving the left lower extremity.  She has a history of underlying diabetes.  Over the last 2 days, she also notes mild shortness of breath in the absence of wheezing.  Denies any associated orthopnea or PND.  No recent chest pain, palpitations, diaphoresis.  She has a history of COPD in the setting of being a former smoker, confirms no known baseline supplemental oxygen requirements.  Medical history also notable for chronic diastolic heart failure, with most recent echocardiogram in February 2020.  On Lasix  20 mg p.o. daily as an outpatient.  She denies any recent dysuria, gross hematuria, or change in urinary urgency/frequency.    ED Course:  Vital signs in the  ED were notable for the following: Temperature max 100.3; rates initially in the 120s, subsequent decreasing in the 90s following initiation of IV fluids; systolic blood pressures in the 100s 120s; respiratory rate 16-20, initial oxygen saturation was 86% on room air, [place] increasing into the range of 95 to 96% on 3 to 4 L nasal cannula.  Labs were notable for the following: CMP notable for the following: Sodium 130 compared to 141 on 06/05/2023, creatinine 1.3 compared to 1.85 on 06/05/2023, liver enzymes are within normal limits.  BNP 92.7 compared to most recent prior value of 88.9 in December 2024.  Lactic acid 1.5.  CBC notable for low blood cell count 20,600 with 81% neutrophils.  INR 1.0.  Blood cultures x 2 were collected prior to initiation of IV antibiotics.  Per my interpretation, EKG in ED demonstrated the following: Sinus rhythm with multiple PVCs, rate 87, no evidence of T wave or ST changes, including no evidence of ST elevation.  Imaging in the ED, per corresponding formal radiology read, was notable for the following: Plain films of the left tib-fib shows subcutaneous edema in the mid and distal foreleg with 2 cm subcutaneous pocket of air, while otherwise demonstrating no evidence of subcutaneous gas.  This imaging also showed no evidence of acute fracture nor any evidence of acute osteomyelitis.  2 view chest x-ray showed no evidence of acute cardiopulmonary process, including no evidence of infiltrate edema or effusion, or pneumothorax.  While in the ED, the following were administered: Acetaminophen  650 mg p.o. x 1 dose, Dilaudid  0.5 mg IV x 1  dose, Zofran  4 mg IV x 1 dose, Rocephin , and vancomycin  Transfer Monitor pulse rate  Subsequently, the patient was admitted for further evaluation and management presenting severe sepsis due to left lower extremity cellulitis, with presentation also notable for acute hypoxic respiratory failure in setting of suspected acute COPD exacerbation  with presenting labs also notable for acute hyponatremia.     Review of Systems: As per HPI otherwise 10 point review of systems negative.   Past Medical History:  Diagnosis Date   Allergy    Anemia    Anxiety    Arthritis    BACK PAIN, CHRONIC 04/04/2007   Qualifier: Diagnosis of  By: [name], [name]    BIPOLAR AFFECTIVE DISORDER 04/04/2007   Qualifier: History of  By: [name], [name] E    CAD (coronary artery disease), native coronary artery    mild CAD with no obstructive disease by coronary CTA 06/2019   Cataract    removed both eyes   Chronic neck pain 04/30/2015   Cervical spondylosis [facility] Intermittent numbness and tingling in occipital region and headaches S/p 2 occipital nerve blocks - only temp relief no relief with gabapentin, ultram , brace MRI neck - facet arthrosis    CKD (chronic kidney disease) 04/26/2015   COPD (chronic obstructive pulmonary disease) (HCC)    pt denies - told this while was smoking -   Cough 04/26/2015   Depression 07/01/2017   Diabetes (HCC) 04/27/2015   Diagnosed 03/2015    Dizziness 06/10/2017   Dysphagia 04/26/2015   DYSPNEA 12/24/2007   Qualifier: Diagnosis of  By: [name]     Elevated TSH 08/09/2015   GASTRIC ULCER, ACUTE 04/07/2007   Annotation: EGD 04/07/07 Qualifier: Diagnosis of  By: [name], [name] E    GASTRITIS 04/07/2007   Annotation: EGD 04/07/07 Qualifier: Diagnosis of  By: [name], [name] E    GERD (gastroesophageal reflux disease)    H/O renal cell carcinoma 05/23/2017   Dx 2010, s/p cryoablation Follows with urology - Dr [name]   HIATAL HERNIA 04/07/2007   Annotation: EGD 04/07/07 Qualifier: Diagnosis of  By: [name], [name] E    History of CVA (cerebrovascular accident) 04/26/2015   History of gout 04/26/2015   Hyperlipidemia    Hypertension    Hypoxia 03/06/2023   Irritable bowel syndrome 04/04/2007   Qualifier: Diagnosis of  By: [name], [name] E    Kidney tumor (benign)     Nausea and vomiting 09/09/2017   Occipital neuralgia 05/09/2016   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 Dr [name] in [place]   OSTEOARTHRITIS 04/04/2007   Qualifier: Diagnosis of  By: [name], [name]    Personal history of colonic adenoma 06/07/2012   POSTNASAL DRIP SYNDROME 12/29/2007   Qualifier: Diagnosis of  By: [name], [name]     RESTLESS LEG SYNDROME 04/04/2007   Qualifier: Diagnosis of  By: [name], [name]    RHINOSINUSITIS, 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   Qualifier: Diagnosis of  By: [name], [name]    Sleep difficulties 09/09/2017   Snoring 04/26/2015   Stroke (HCC)    x2 2011   Thoracic aortic aneurysm (HCC) 02/06/2018   Seen on MRI 01/25/18 of thoracic spine ordered by [facility] -- referred to CTS to monitor  -- 3.5 cm   TIA (transient ischemic attack)    TOBACCO ABUSE 12/17/2007   Qualifier: Diagnosis of  By: [name], Dorethia  Trigeminal neuralgia of right side of face 02/08/2016   URI (upper respiratory infection) 02/08/2016   Vitamin D  deficiency 03/12/2018    Past Surgical History:  Procedure Laterality Date   ABDOMINAL HYSTERECTOMY     APPENDECTOMY     CATARACT EXTRACTION     COLONOSCOPY     CYSTOCELE REPAIR     ESOPHAGOGASTRODUODENOSCOPY     KIDNEY SURGERY     benign tumor removal   ORTHOPEDIC SURGERY     Wrist & elbow   POLYPECTOMY     RECTOCELE REPAIR     TONSILECTOMY, ADENOIDECTOMY, BILATERAL MYRINGOTOMY AND TUBES Bilateral 1968   UPPER GASTROINTESTINAL ENDOSCOPY      Social History:  reports that she quit smoking about 14 years ago. Her smoking use included cigarettes. She started smoking about 60 years ago. She has a 46 pack-year smoking history. She has been exposed to tobacco smoke. She has never used smokeless tobacco. She reports that she does not drink alcohol  and does not use drugs.   Allergies  Allergen Reactions   Gabapentin Other (See Comments)    Fluid retention    Clindamycin  Rash   Sulfa Antibiotics Nausea And  Vomiting    Family History  Problem Relation Age of Onset   Congestive Heart Failure Mother    Cancer Mother        ovarian   Heart disease Father    Diabetes Father    Kidney disease Father    Diabetes Sister    Kidney disease Sister    Colon cancer Neg Hx    Colon polyps Neg Hx    Esophageal cancer Neg Hx    Rectal cancer Neg Hx    Stomach cancer Neg Hx     Family history reviewed and not pertinent    Prior to Admission medications   Medication Sig Start Date End Date Taking? Authorizing Provider  albuterol  (PROVENTIL ) (2.5 MG/3ML) 0.083% nebulizer solution USE 1 VIAL IN NEBULIZER EVERY 6 HOURS AND AS NEEDED FOR SHORT BREATH/WHEEZE.  Generic:  Ventolin  09/30/23  Yes Meade Verdon RAMAN, MD  allopurinol  (ZYLOPRIM ) 300 MG tablet Take 1 tablet by mouth once daily 09/02/23  Yes Burns, Glade PARAS, MD  amLODipine  (NORVASC ) 5 MG tablet TAKE 1 & 1/2 (ONE & ONE-HALF) TABLETS BY MOUTH ONCE DAILY Patient taking differently: Take 15 mg by mouth daily. TAKE 1 & 1/2 (ONE & ONE-HALF) TABLETS BY MOUTH ONCE DAILY 07/24/23  Yes Burns, Glade PARAS, MD  cetirizine  (ZYRTEC  ALLERGY) 10 MG tablet Take 10 mg by mouth daily as needed for allergies or rhinitis. 05/06/19  Yes [provider]  cholecalciferol (VITAMIN D3) 25 MCG (1000 UNIT) tablet Take 2,000 Units by mouth daily.   Yes [provider]  clopidogrel  (PLAVIX ) 75 MG tablet Take 1 tablet by mouth once daily 09/02/23  Yes Burns, Glade PARAS, MD  cyclobenzaprine  (FLEXERIL ) 5 MG tablet Take 1 tablet (5 mg total) by mouth 3 (three) times daily as needed. for muscle spams 03/31/23  Yes Burns, Glade PARAS, MD  DULoxetine  (CYMBALTA ) 30 MG capsule Take 1 capsule (30 mg total) by mouth daily. Patient taking differently: Take 60 mg by mouth daily. 07/24/23  Yes Burns, Glade PARAS, MD  FARXIGA  10 MG TABS tablet Take 1 tablet (10 mg total) by mouth daily. Take 10 mg by mouth daily. 03/21/23  Yes Burns, Glade PARAS, MD  ferrous sulfate  325 (65 FE) MG tablet Take 325 mg by mouth  every other day.   Yes [provider]  fluticasone  (FLONASE ) 50 MCG/ACT nasal spray Place 2 sprays into both nostrils daily. Patient taking differently: Place 2 sprays into both nostrils daily as needed for allergies. 10/03/23  Yes Soldatova, Liuba, MD  furosemide  (LASIX ) 20 MG tablet Take 1 tablet (20 mg total) by mouth daily as needed for fluid or edema. 03/06/23  Yes Burns, Glade PARAS, MD  HYDROcodone  bit-homatropine (HYCODAN) 5-1.5 MG/5ML syrup Take 5 mLs by mouth every 8 (eight) hours as needed for cough. 08/12/23  Yes Burns, Glade PARAS, MD  metoprolol  tartrate (LOPRESSOR ) 25 MG tablet Take 1 tablet (25 mg total) by mouth 2 (two) times daily. May take extra 25 mg tablet for as needed palpitations 02/12/23  Yes Odell Celinda Balo, MD  montelukast  (SINGULAIR ) 10 MG tablet TAKE 1 TABLET BY MOUTH ONCE DAILY AT BEDTIME 07/25/23  Yes Burns, Glade PARAS, MD  MOUNJARO  5 MG/0.5ML Pen INJECT 1/2 (ONE-HALF) ML SUBCUTANEOUSLY  ONCE A WEEK 09/23/23  Yes Burns, Glade PARAS, MD  pantoprazole  (PROTONIX ) 40 MG tablet Take 1 tablet (40 mg total) by mouth 2 (two) times daily before a meal. 02/01/23  Yes Burns, Glade PARAS, MD  rosuvastatin  (CRESTOR ) 40 MG tablet Take 1 tablet by mouth once daily 09/02/23  Yes Turner, Wilbert SAUNDERS, MD  sodium chloride  (OCEAN) 0.65 % SOLN nasal spray Place 1 spray into both nostrils as needed for congestion. 07/29/23  Yes Meade Verdon RAMAN, MD  telmisartan  (MICARDIS ) 40 MG tablet Take 1 tablet (40 mg total) by mouth daily. 08/26/23  Yes Burns, Glade PARAS, MD  traMADol  (ULTRAM ) 50 MG tablet Take 50 mg by mouth 3 (three) times daily as needed. for pain 09/18/23  Yes [provider]  levocetirizine (XYZAL  ALLERGY 24HR) 5 MG tablet Take 1 tablet (5 mg total) by mouth every evening. Patient not taking: Reported on 10/06/2023 10/03/23   Okey Burns, MD     Objective    Physical Exam: Vitals:   10/06/23 1904 10/06/23 2045 10/06/23 2048 10/06/23 2100  BP:  (!) 129/114  91/78  Pulse:  (!) 101  99   Resp:    18  Temp:      TempSrc:      SpO2:  (!) 86% 93% 91%  Weight: 102.6 kg     Height: 5' 5 (1.651 m)       General: appears to be stated age; alert, oriented Skin: warm, erythema, increased tenderness, warmth, swelling associated with anterior lateral aspect of left lower extremity distal to the left knee, without crepitus Head:  AT/[place] Mouth:  Oral mucosa membranes appear dry, normal dentition Neck: supple; trachea midline Heart:  RRR; did not appreciate any M/R/G Lungs: CTAB, did not appreciate any wheezes, rales, or rhonchi Abdomen: + BS; soft, ND, NT Vascular: 2+ pedal pulses b/l; 2+ radial pulses b/l Extremities: no muscle wasting; left lower extremity erythema, tenderness, increased warmth, swelling, as noted above.    Labs on Admission: I have personally reviewed following labs and imaging studies  CBC: Recent Labs  Lab 10/06/23 1908  WBC 20.6*  NEUTROABS 16.6*  HGB 15.9*  HCT 49.6*  MCV 97.1  PLT 287   Basic Metabolic Panel: Recent Labs  Lab 10/06/23 1908  NA 130*  K 4.3  CL 99  CO2 19*  GLUCOSE 140*  BUN 21  CREATININE 1.93*  CALCIUM  9.1   GFR: Estimated Creatinine Clearance: 28.5 mL/min (A) (by C-G formula based on SCr of 1.93 mg/dL (H)). Liver Function Tests: Recent Labs  Lab 10/06/23 1908  AST 16  ALT 12  ALKPHOS 93  BILITOT 0.9  PROT 7.1  ALBUMIN  3.5   No results for input(s): LIPASE, AMYLASE in the last 168 hours. No results for input(s): AMMONIA in the last 168 hours. Coagulation Profile: Recent Labs  Lab 10/06/23 1908  INR 1.0   Cardiac Enzymes: No results for input(s): CKTOTAL, CKMB, CKMBINDEX, TROPONINI in the last 168 hours. BNP (last 3 results) No results for input(s): PROBNP in the last 8760 hours. HbA1C: No results for input(s): HGBA1C in the last 72 hours. CBG: No results for input(s): GLUCAP in the last 168 hours. Lipid Profile: No results for input(s): CHOL, HDL, LDLCALC, TRIG,  CHOLHDL, LDLDIRECT in the last 72 hours. Thyroid  Function Tests: No results for input(s): TSH, T4TOTAL, FREET4, T3FREE, THYROIDAB in the last 72 hours. Anemia Panel: No results for input(s): VITAMINB12, FOLATE, FERRITIN, TIBC, IRON, RETICCTPCT in the last 72 hours. Urine analysis:    Component Value Date/Time   COLORURINE YELLOW 02/11/2023 0129   APPEARANCEUR CLEAR 02/11/2023 0129   LABSPEC 1.013 02/11/2023 0129   PHURINE 5.0 02/11/2023 0129   GLUCOSEU >=500 (A) 02/11/2023 0129   GLUCOSEU >=1000 (A) 05/15/2022 1554   HGBUR NEGATIVE 02/11/2023 0129   BILIRUBINUR NEGATIVE 02/11/2023 0129   BILIRUBINUR negative 09/25/2022 1027   KETONESUR NEGATIVE 02/11/2023 0129   PROTEINUR NEGATIVE 02/11/2023 0129   UROBILINOGEN 0.2 09/25/2022 1027   UROBILINOGEN 0.2 05/15/2022 1554   NITRITE NEGATIVE 02/11/2023 0129   LEUKOCYTESUR MODERATE (A) 02/11/2023 0129    Radiological Exams on Admission: DG Tibia/Fibula Left Result Date: 10/06/2023 CLINICAL DATA:  Lower extremity pain.  Potential for osteomyelitis. EXAM: LEFT TIBIA AND FIBULA - 2 VIEW COMPARISON:  Study of 08/10/2023. FINDINGS: AP Lat two-view exam obtained in 4 films, again reveals generalized edema in the mid and distal foreleg beginning in the mid calf continuing inferiorly and greater laterally. In the anterolateral mid foreleg there is a focally more prominent edema with 2 cm subcutaneous air pocket or soft tissue defect not seen previously. There is no radiopaque foreign body. No evidence of fractures or acute osteomyelitis. There is moderate arthrosis at the left knee and left ankle. Enthesopathic changes of the anterior patella. There is a popcorn like calcification again noted in the popliteal fossa which would either represent a calcified popliteal lymph node or osteochondral loose body such as within the posterior joint space or in a popliteal cyst. There are patchy calcifications in the posterior tibial artery.  IMPRESSION: 1. Generalized edema in the mid and distal foreleg, greater laterally. 2. Focally more prominent edema in the anterolateral mid foreleg with 2 cm subcutaneous air pocket or soft tissue defect not seen previously. 3. No evidence of fractures or acute osteomyelitis. 4. Moderate arthrosis at the left knee and left ankle. 5. Calcified popliteal lymph node versus osteochondral loose body in the posterior joint space or in a popliteal cyst. Electronically Signed   By: Francis Quam M.D.   On: 10/06/2023 21:02      Assessment/Plan   Principal Problem:   Cellulitis of left lower extremity Active Problems:   DM2 (diabetes mellitus, type 2) (HCC)   Acute hypoxic respiratory failure (HCC)   COPD with acute exacerbation (HCC)   CKD stage 3b, GFR 30-44 ml/min (HCC)   Chronic diastolic CHF (congestive heart failure) (HCC)   Severe sepsis (HCC)   Acute hyponatremia   Sterile pyuria     #) Severe sepsis due to cellulitis of the left lower extremity: Diagnosed on the  basis of 4 to 5 days of progressive left lower extremity erythema, tenderness, increased warmth, swelling, with pain, performed today showing evidence of subcutaneous edema consistent with cellulitis in the absence of evidence of acute osteomyelitis.  Additionally, no evidence of generalized subcutaneous gas nor evidence of crepitus on exam to suggest underlying necrotizing fasciitis.  Suspect that the patient's On the anterior aspect of the left lower extremity falling off serves as the portal of entry leading to this infectious process.  There is an element of purulent drainage, concerning for underlying MRSA.  SIRS criteria met via leukocytosis with neutrophilic predominance, tachycardia. Lactic acid level: Nonelevated at 1.5. Of note, given the associated presence of suspected end organ damage in the form of concominant presenting acute hypoxic respiratory failure, criteria are met for scab on the anterior aspect of the left  lower extremity sepsis to be considered severe in nature. However, in the absence of lactic acid level that is greater than or equal to 4.0, and in the absence of any associated hypotension refractory to IVF's, there are no indications for administration of a 30 mL/kg IVF bolus at this time.   Additional ED work-up/management notable for: Blood cultures x 2 followed by initiation of IV vancomycin  and Rocephin .   No e/o additional infectious process at this time, including chest x-ray, which showed no evidence of infiltrate to suggest pneumonia.  Urinalysis shows greater than 50 white blood cells, but appears to be a contaminated specimen, and in the absence of any acute urinary symptoms, these constellation of findings do not appear to be consistent with UTI, as further detailed below.  Plan: CBC w/ diff and CMP in AM.  Follow for results of blood cx's x 2. Abx: Continue IV vancomycin , Rocephin .  Gentle IV fluids from lactated Ringer 's at 75 cc/h x 10 hours.  Prn IV fentanyl .  I placed RN communication order requesting that left lower extremity be elevated..  Acetaminophen  for fever.  Add on procalcitonin level.  Assess for entry.                  #) Acute hypoxic respiratory failure due to suspected acute COPD exacerbation: In the absence of any known baseline supplemental oxygen requirements, she is noted to have initial oxygen saturation of 86% room air, subsidy improving into the mid 90s on 4 L nasal cannula.  Suspect that this is on the basis of a mild acute COPD exacerbation in the setting of new shortness of breath over the course of the last 2 days, while noting that chest x-ray shows no evidence of acute cardiopulmonary process, including no evidence of infiltrate to suggest pneumonia.  Additionally, new clinical, radiographic or laboratory findings suggestive of acutely decompensated heart failure, though close monitoring of volume status, as outlined below.  Presentation  persistence of acute PE.  ACS also appears less likely, in the absence of any recent chest pain, will EKG shows no evidence of acute ischemic changes, including evidence of STEMI.  Suspect predisposition towards acute COPD exacerbation in the setting of physiologic stress stemming from evolving severe sepsis due to left lower extremity cellulitis.  Plan: Solumedrol, scheduled duo nebulizer treatments, as needed albuterol  nebulizer.  Check magnesium and phosphorus levels.  Monitor strict I's and O's and daily weights.  Repeat BMP in the morning.  Add on procalcitonin level.                 #) Acute hypo-osmolar  hyponatremia: Presenting sodium 138 compared to most recent  provide 141 on 06/05/2023. Suspect an element of hypovolumeia, with suspected contribution from ostensible losses in the setting of her left lower extremity cellulitis, including finding of associated purulent drainage .  Will also assess for any contribution from SIADH.  There may also be pharmacologic contributions, including home Cymbalta .  Plan: monitor strict I's and O's and daily weights.  check random urine sodium, urine osmolality.  Check serum osmolality to confirm suspected hypoosmolar etiology.  Repeat CMP in the morning. Check TSH. Gentle IVF's in the form of lactated Ringer 's at 72 cc/h.  Hold home Cymbalta  for now.  Repeat bnp in the morning.       #) Asymptomatic Pyuria: Presenting urinalysis suggestive of pyuria but in the context of 21-50 squamous epithelial cells suggestive of an element of contamination, likely contributing to a false elevation of her pyuria.  Additionally, no acute urinary symptoms.  Overall, these findings appear most consistent with asymptomatic pyuria, and do not appear to warrant independent antibiotic coverage for such.  Plan: Repeat CBC in the morning.  Will follow for reflex urine culture.                     #) Type 2 Diabetes Mellitus: documented history of  such. Home insulin  regimen: None.  Rather, as an outpatient, she is on Mounjaro , Farxiga .  Most recent hemoglobin A1c was found to be 7.2% when checked in April 2025.  Presenting glucose 140.  Plan: accuchecks QAC and HS with low dose SSI.  Add on hemoglobin A1c level.  Hold home Farxiga  and Mounjaro  for now.                      #) Chronic diastolic heart failure: documented history of such, with most recent echocardiogram performed in February 2020, which was notable for LVEF 60 to 65%, low ventricular diastolic parameters that were consistent with impaired relaxation, normal right ventricular systolic function and moderately dilated left atrium. No clinical or evidence to suggest acutely decompensated heart failure at this time.  BNP appears to be at baseline, as further quantified above home diuretic regimen reportedly consists of the following: 20 mg p.o. daily.  In the setting of clinical evidence of mild dehydration, will hold home Lasix  for now, pursue gentle IV fluids, along with close monitoring of insulin  volume status as outlined below.  Plan: monitor strict I's & O's and daily weights. Repeat CMP in AM. Check serum mag level.  Hold next dose of Lasix , as above.  Repeat BNP in the morning.                     #) CKD Stage 3B: Documented history of such, with baseline creatinine 1.5-2.0, with presenting creatinine consistent with this baseline.   Plan: Monitor strict I's and O's and daily weights.  Attempt to avoid nephrotoxic agents.  CMP/magnesium level in the AM.        DVT prophylaxis: SCD's   Code Status: DNR/DNI , per my discussions inpatientwith pt Family Communication: none Disposition Plan: Per Rounding Team Consults called: none;  Admission status: inpatient     I SPENT GREATER THAN 75  MINUTES IN CLINICAL CARE TIME/MEDICAL DECISION-MAKING IN COMPLETING THIS ADMISSION.      Nurah Petrides B Aston Lawhorn DO [facility]  From 7PM  - 7AM   10/06/2023, 10:35 PM

## 2023-10-07 ENCOUNTER — Encounter (HOSPITAL_COMMUNITY): Payer: Self-pay | Admitting: Internal Medicine

## 2023-10-07 ENCOUNTER — Inpatient Hospital Stay (HOSPITAL_COMMUNITY)

## 2023-10-07 DIAGNOSIS — E871 Hypo-osmolality and hyponatremia: Secondary | ICD-10-CM | POA: Diagnosis present

## 2023-10-07 DIAGNOSIS — L03116 Cellulitis of left lower limb: Secondary | ICD-10-CM

## 2023-10-07 DIAGNOSIS — R6520 Severe sepsis without septic shock: Secondary | ICD-10-CM | POA: Diagnosis present

## 2023-10-07 DIAGNOSIS — L02416 Cutaneous abscess of left lower limb: Secondary | ICD-10-CM | POA: Diagnosis not present

## 2023-10-07 DIAGNOSIS — A419 Sepsis, unspecified organism: Secondary | ICD-10-CM | POA: Diagnosis present

## 2023-10-07 DIAGNOSIS — R8281 Pyuria: Secondary | ICD-10-CM | POA: Diagnosis present

## 2023-10-07 LAB — CBC WITH DIFFERENTIAL/PLATELET
Abs Immature Granulocytes: 0.12 K/uL — ABNORMAL HIGH (ref 0.00–0.07)
Basophils Absolute: 0.1 K/uL (ref 0.0–0.1)
Basophils Relative: 0 %
Eosinophils Absolute: 0.1 K/uL (ref 0.0–0.5)
Eosinophils Relative: 0 %
HCT: 44.4 % (ref 36.0–46.0)
Hemoglobin: 14.3 g/dL (ref 12.0–15.0)
Immature Granulocytes: 1 %
Lymphocytes Relative: 8 %
Lymphs Abs: 1.7 K/uL (ref 0.7–4.0)
MCH: 31.3 pg (ref 26.0–34.0)
MCHC: 32.2 g/dL (ref 30.0–36.0)
MCV: 97.2 fL (ref 80.0–100.0)
Monocytes Absolute: 2 K/uL — ABNORMAL HIGH (ref 0.1–1.0)
Monocytes Relative: 10 %
Neutro Abs: 17 K/uL — ABNORMAL HIGH (ref 1.7–7.7)
Neutrophils Relative %: 81 %
Platelets: 232 K/uL (ref 150–400)
RBC: 4.57 MIL/uL (ref 3.87–5.11)
RDW: 14.2 % (ref 11.5–15.5)
WBC: 20.9 K/uL — ABNORMAL HIGH (ref 4.0–10.5)
nRBC: 0 % (ref 0.0–0.2)

## 2023-10-07 LAB — URINALYSIS, W/ REFLEX TO CULTURE (INFECTION SUSPECTED)
Bilirubin Urine: NEGATIVE
Glucose, UA: >=500 mg/dL — AB
Ketones, ur: NEGATIVE mg/dL
Nitrite: NEGATIVE
Protein, ur: NEGATIVE mg/dL
Specific Gravity, Urine: 1.011 (ref 1.005–1.030)
WBC, UA: >50 WBC/hpf (ref 0–5)
pH: 5 (ref 5.0–8.0)

## 2023-10-07 LAB — MAGNESIUM: Magnesium: 1.8 mg/dL (ref 1.7–2.4)

## 2023-10-07 LAB — COMPREHENSIVE METABOLIC PANEL WITH GFR
ALT: 14 U/L (ref 0–44)
AST: 22 U/L (ref 15–41)
Albumin: 3 g/dL — ABNORMAL LOW (ref 3.5–5.0)
Alkaline Phosphatase: 87 U/L (ref 38–126)
Anion gap: 10 (ref 5–15)
BUN: 21 mg/dL (ref 8–23)
CO2: 23 mmol/L (ref 22–32)
Calcium: 8.7 mg/dL — ABNORMAL LOW (ref 8.9–10.3)
Chloride: 99 mmol/L (ref 98–111)
Creatinine, Ser: 1.97 mg/dL — ABNORMAL HIGH (ref 0.44–1.00)
GFR, Estimated: 26 mL/min — ABNORMAL LOW (ref >60)
Glucose, Bld: 120 mg/dL — ABNORMAL HIGH (ref 70–99)
Potassium: 4.3 mmol/L (ref 3.5–5.1)
Sodium: 132 mmol/L — ABNORMAL LOW (ref 135–145)
Total Bilirubin: 1 mg/dL (ref 0.0–1.2)
Total Protein: 6 g/dL — ABNORMAL LOW (ref 6.5–8.1)

## 2023-10-07 LAB — OSMOLALITY: Osmolality: 285 mosm/kg (ref 275–295)

## 2023-10-07 LAB — HEMOGLOBIN A1C
Hgb A1c MFr Bld: 6.4 % — ABNORMAL HIGH (ref 4.8–5.6)
Mean Plasma Glucose: 137 mg/dL

## 2023-10-07 LAB — GLUCOSE, CAPILLARY
Glucose-Capillary: 123 mg/dL — ABNORMAL HIGH (ref 70–99)
Glucose-Capillary: 127 mg/dL — ABNORMAL HIGH (ref 70–99)
Glucose-Capillary: 147 mg/dL — ABNORMAL HIGH (ref 70–99)

## 2023-10-07 LAB — PHOSPHORUS: Phosphorus: 2.7 mg/dL (ref 2.5–4.6)

## 2023-10-07 LAB — TSH: TSH: 1.706 u[IU]/mL (ref 0.350–4.500)

## 2023-10-07 LAB — BRAIN NATRIURETIC PEPTIDE: B Natriuretic Peptide: 123.3 pg/mL — ABNORMAL HIGH (ref 0.0–100.0)

## 2023-10-07 MED ORDER — IPRATROPIUM-ALBUTEROL 0.5-2.5 (3) MG/3ML IN SOLN
3.0000 mL | Freq: Four times a day (QID) | RESPIRATORY_TRACT | Status: DC
  Filled 2023-10-07: qty 3

## 2023-10-07 MED ORDER — ALBUTEROL SULFATE (2.5 MG/3ML) 0.083% IN NEBU
2.5000 mg | INHALATION_SOLUTION | RESPIRATORY_TRACT | Status: DC | PRN
  Administered 2023-10-07 – 2023-10-11 (×3): 2.5 mg via RESPIRATORY_TRACT
  Filled 2023-10-07 (×2): qty 3

## 2023-10-07 MED ORDER — CLOPIDOGREL BISULFATE 75 MG PO TABS
75.0000 mg | ORAL_TABLET | Freq: Every day | ORAL | Status: DC
  Administered 2023-10-07 – 2023-10-13 (×10): 75 mg via ORAL
  Filled 2023-10-07 (×7): qty 1

## 2023-10-07 MED ORDER — OXYCODONE HCL 5 MG PO TABS
5.0000 mg | ORAL_TABLET | ORAL | Status: DC | PRN
  Administered 2023-10-07 (×2): 5 mg via ORAL
  Filled 2023-10-07: qty 1

## 2023-10-07 MED ORDER — METHYLPREDNISOLONE SODIUM SUCC 125 MG IJ SOLR: 80.0000 mg | Freq: Two times a day (BID) | INTRAMUSCULAR | Status: DC

## 2023-10-07 MED ORDER — VANCOMYCIN HCL 1000 MG IV SOLR
1000.0000 mg | INTRAVENOUS | Status: DC
  Administered 2023-10-08 (×2): 1000 mg via INTRAVENOUS
  Filled 2023-10-07: qty 1000
  Filled 2023-10-07: qty 20

## 2023-10-07 MED ORDER — FLUTICASONE PROPIONATE 50 MCG/ACT NA SUSP
1.0000 | Freq: Every day | NASAL | Status: DC
  Administered 2023-10-07 – 2023-10-13 (×8): 1 via NASAL
  Filled 2023-10-07: qty 16

## 2023-10-07 MED ORDER — ROSUVASTATIN CALCIUM 20 MG PO TABS
40.0000 mg | ORAL_TABLET | Freq: Every day | ORAL | Status: DC
Start: 2023-10-07 — End: 2023-10-13
  Administered 2023-10-07 – 2023-10-13 (×10): 40 mg via ORAL
  Filled 2023-10-07 (×7): qty 2

## 2023-10-07 MED ORDER — FENTANYL CITRATE PF 50 MCG/ML IJ SOSY
25.0000 ug | PREFILLED_SYRINGE | INTRAMUSCULAR | Status: DC | PRN
  Administered 2023-10-07 – 2023-10-11 (×13): 25 ug via INTRAVENOUS
  Filled 2023-10-07 (×8): qty 1

## 2023-10-07 NOTE — Progress Notes (Signed)
 Patient to MRI at this time.

## 2023-10-07 NOTE — ED Notes (Signed)
 Patient's O2 noted to be in 80s, checked on patient and found her [place] on top of nose, readjusted so prongs were in her nostrils.

## 2023-10-07 NOTE — Progress Notes (Signed)
 Patient reports at this time the pain in her calf, which has been localized to the wound, is now also in the back of her calf and down into her foot.  Patient denies need for pain medication at this time.

## 2023-10-07 NOTE — Consult Note (Signed)
 Reason for Consult:LLE infection Referring Physician: Nilda Fendt Time called: 0730 Time at bedside: 0853   Kerry Powell is an 78 y.o. female.  HPI: Diamonds hit her shin on a step about a month ago. She had a swelling local to the point of impact. It has been sore but not causing too much trouble until this weekend when it began to drain greenish discharge. She also had chills and sweats but no measured fevers. She came to the ED and was admitted and orthopedic surgery was consulted the following morning.  Past Medical History:  Diagnosis Date   Allergy    Anemia    Anxiety    Arthritis    BACK PAIN, CHRONIC 04/04/2007   Qualifier: Diagnosis of  By: [name], [name]    BIPOLAR AFFECTIVE DISORDER 04/04/2007   Qualifier: History of  By: [name], [name] E    CAD (coronary artery disease), native coronary artery    mild CAD with no obstructive disease by coronary CTA 06/2019   Cataract    removed both eyes   Chronic neck pain 04/30/2015   Cervical spondylosis [facility] Intermittent numbness and tingling in occipital region and headaches S/p 2 occipital nerve blocks - only temp relief no relief with gabapentin, ultram , brace MRI neck - facet arthrosis    CKD (chronic kidney disease) 04/26/2015   COPD (chronic obstructive pulmonary disease) (HCC)    pt denies - told this while was smoking -   Cough 04/26/2015   Depression 07/01/2017   Diabetes (HCC) 04/27/2015   Diagnosed 03/2015    Dizziness 06/10/2017   Dysphagia 04/26/2015   DYSPNEA 12/24/2007   Qualifier: Diagnosis of  By: [name]     Elevated TSH 08/09/2015   GASTRIC ULCER, ACUTE 04/07/2007   Annotation: EGD 04/07/07 Qualifier: Diagnosis of  By: [name], [name] E    GASTRITIS 04/07/2007   Annotation: EGD 04/07/07 Qualifier: Diagnosis of  By: [name], [name] E    GERD (gastroesophageal reflux disease)    H/O renal cell carcinoma 05/23/2017   Dx 2010, s/p cryoablation Follows with urology - Dr  [name]   HIATAL HERNIA 04/07/2007   Annotation: EGD 04/07/07 Qualifier: Diagnosis of  By: [name], [name] E    History of CVA (cerebrovascular accident) 04/26/2015   History of gout 04/26/2015   Hyperlipidemia    Hypertension    Hypoxia 03/06/2023   Irritable bowel syndrome 04/04/2007   Qualifier: Diagnosis of  By: [name], [name] E    Kidney tumor (benign)    Nausea and vomiting 09/09/2017   Occipital neuralgia 05/09/2016   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 Dr [name] in [place]   OSTEOARTHRITIS 04/04/2007   Qualifier: Diagnosis of  By: [name], [name]    Personal history of colonic adenoma 06/07/2012   POSTNASAL DRIP SYNDROME 12/29/2007   Qualifier: Diagnosis of  By: [name], [name]     RESTLESS LEG SYNDROME 04/04/2007   Qualifier: Diagnosis of  By: [name], [name]    RHINOSINUSITIS, 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   Qualifier: Diagnosis of  By: [name], [name]    Sleep difficulties 09/09/2017   Snoring 04/26/2015   Stroke (HCC)    x2 2011   Thoracic aortic aneurysm (HCC) 02/06/2018   Seen on MRI 01/25/18 of thoracic spine ordered by [facility] -- referred to CTS to monitor  -- 3.5 cm   TIA (transient ischemic attack)  TOBACCO ABUSE 12/17/2007   Qualifier: Diagnosis of  By: [name], [name]     Trigeminal neuralgia of right side of face 02/08/2016   URI (upper respiratory infection) 02/08/2016   Vitamin D  deficiency 03/12/2018    Past Surgical History:  Procedure Laterality Date   ABDOMINAL HYSTERECTOMY     APPENDECTOMY     CATARACT EXTRACTION     COLONOSCOPY     CYSTOCELE REPAIR     ESOPHAGOGASTRODUODENOSCOPY     KIDNEY SURGERY     benign tumor removal   ORTHOPEDIC SURGERY     Wrist & elbow   POLYPECTOMY     RECTOCELE REPAIR     TONSILECTOMY, ADENOIDECTOMY, BILATERAL MYRINGOTOMY AND TUBES Bilateral 1968   UPPER GASTROINTESTINAL ENDOSCOPY      Family History  Problem Relation Age of Onset   Congestive Heart Failure Mother    Cancer  Mother        ovarian   Heart disease Father    Diabetes Father    Kidney disease Father    Diabetes Sister    Kidney disease Sister    Colon cancer Neg Hx    Colon polyps Neg Hx    Esophageal cancer Neg Hx    Rectal cancer Neg Hx    Stomach cancer Neg Hx     Social History:  reports that she quit smoking about 14 years ago. Her smoking use included cigarettes. She started smoking about 60 years ago. She has a 46 pack-year smoking history. She has been exposed to tobacco smoke. She has never used smokeless tobacco. She reports that she does not drink alcohol  and does not use drugs.  Allergies:  Allergies  Allergen Reactions   Gabapentin Other (See Comments)    Fluid retention    Clindamycin  Rash   Sulfa Antibiotics Nausea And Vomiting    Medications: I have reviewed the patient's current medications.  Results for orders placed or performed during the hospital encounter of 10/06/23 (from the past 48 hours)  Urinalysis, w/ Reflex to Culture (Infection Suspected) -Urine, Clean Catch     Status: Abnormal   Collection Time: 10/06/23  1:09 AM  Result Value Ref Range   Specimen Source URINE, CATHETERIZED    Color, Urine YELLOW YELLOW   APPearance CLOUDY (A) CLEAR   Specific Gravity, Urine 1.011 1.005 - 1.030   pH 5.0 5.0 - 8.0   Glucose, UA >=500 (A) NEGATIVE mg/dL   Hgb urine dipstick SMALL (A) NEGATIVE   Bilirubin Urine NEGATIVE NEGATIVE   Ketones, ur NEGATIVE NEGATIVE mg/dL   Protein, ur NEGATIVE NEGATIVE mg/dL   Nitrite NEGATIVE NEGATIVE   Leukocytes,Ua LARGE (A) NEGATIVE   RBC / HPF 11-20 0 - 5 RBC/hpf   WBC, UA >50 0 - 5 WBC/hpf    Comment:        Reflex urine culture not performed if WBC <=10, OR if Squamous epithelial cells >5. If Squamous epithelial cells >5 suggest recollection.    Bacteria, UA RARE (A) NONE SEEN   Squamous Epithelial / HPF 21-50 0 - 5 /HPF   Mucus PRESENT    Hyaline Casts, UA PRESENT    Non Squamous Epithelial 0-5 (A) NONE SEEN    Comment:  Performed at [facility], [address] [place], [name] [address]  Comprehensive metabolic panel     Status: Abnormal   Collection Time: 10/06/23  7:08 PM  Result Value Ref Range   Sodium 130 (L) 135 - 145 mmol/L  Potassium 4.3 3.5 - 5.1 mmol/L   Chloride 99 98 - 111 mmol/L   CO2 19 (L) 22 - 32 mmol/L   Glucose, Bld 140 (H) 70 - 99 mg/dL    Comment: Glucose reference range applies only to samples taken after fasting for at least 8 hours.   BUN 21 8 - 23 mg/dL   Creatinine, Ser 8.06 (H) 0.44 - 1.00 mg/dL   Calcium  9.1 8.9 - 10.3 mg/dL   Total Protein 7.1 6.5 - 8.1 g/dL   Albumin  3.5 3.5 - 5.0 g/dL   AST 16 15 - 41 U/L   ALT 12 0 - 44 U/L   Alkaline Phosphatase 93 38 - 126 U/L   Total Bilirubin 0.9 0.0 - 1.2 mg/dL   GFR, Estimated 26 (L) >60 mL/min    Comment: (NOTE) Calculated using the CKD-EPI Creatinine Equation (2021)    Anion gap 12 5 - 15    Comment: Performed at [facility], [address], [place], [name] [address]  CBC with Differential     Status: Abnormal   Collection Time: 10/06/23  7:08 PM  Result Value Ref Range   WBC 20.6 (H) 4.0 - 10.5 K/uL   RBC 5.11 3.87 - 5.11 MIL/uL   Hemoglobin 15.9 (H) 12.0 - 15.0 g/dL   HCT 50.3 (H) 63.9 - 53.9 %   MCV 97.1 80.0 - 100.0 fL   MCH 31.1 26.0 - 34.0 pg   MCHC 32.1 30.0 - 36.0 g/dL   RDW 85.9 88.4 - 84.4 %   Platelets 287 150 - 400 K/uL   nRBC 0.0 0.0 - 0.2 %   Neutrophils Relative % 81 %   Neutro Abs 16.6 (H) 1.7 - 7.7 K/uL   Lymphocytes Relative 8 %   Lymphs Abs 1.7 0.7 - 4.0 K/uL   Monocytes Relative 10 %   Monocytes Absolute 2.0 (H) 0.1 - 1.0 K/uL   Eosinophils Relative 0 %   Eosinophils Absolute 0.1 0.0 - 0.5 K/uL   Basophils Relative 0 %   Basophils Absolute 0.1 0.0 - 0.1 K/uL   Immature Granulocytes 1 %   Abs Immature Granulocytes 0.15 (H) 0.00 - 0.07 K/uL    Comment: Performed at [facility], [address], [place], [name] [address]  Protime-INR     Status: None   Collection Time:  10/06/23  7:08 PM  Result Value Ref Range   Prothrombin Time 13.7 11.4 - 15.2 seconds   INR 1.0 0.8 - 1.2    Comment: (NOTE) INR goal varies based on device and disease states. Performed at [facility], [address], [place], [name] [address]   Culture, blood (Routine x 2)     Status: None (Preliminary result)   Collection Time: 10/06/23  7:08 PM   Specimen: BLOOD  Result Value Ref Range   Specimen Description BLOOD RIGHT ANTECUBITAL    Special Requests AEROBIC BOTTLE ONLY Blood Culture adequate volume    Culture      NO GROWTH < 12 HOURS Performed at [facility], [address], [place], [name] [address]    Report Status PENDING   Brain natriuretic peptide     Status: None   Collection Time: 10/06/23  7:08 PM  Result Value Ref Range   B Natriuretic Peptide 92.7 0.0 - 100.0 pg/mL    Comment: Performed at [facility], [address], [place], [name] [address]  Magnesium     Status:  None   Collection Time: 10/06/23  7:22 PM  Result Value Ref Range   Magnesium 1.7 1.7 - 2.4 mg/dL    Comment: Performed at [facility], [address], [place], [name] [address]  Procalcitonin     Status: None   Collection Time: 10/06/23  7:22 PM  Result Value Ref Range   Procalcitonin 0.13 ng/mL    Comment:        Interpretation: PCT (Procalcitonin) <= 0.5 ng/mL: Systemic infection (sepsis) is not likely. Local bacterial infection is possible. (NOTE)       Sepsis PCT Algorithm           Lower Respiratory Tract                                      Infection PCT Algorithm    ----------------------------     ----------------------------         PCT < 0.25 ng/mL                PCT < 0.10 ng/mL          Strongly encourage             Strongly discourage   discontinuation of antibiotics    initiation of antibiotics    ----------------------------     -----------------------------       PCT 0.25 - 0.50 ng/mL            PCT 0.10 - 0.25 ng/mL               OR        >80% decrease in PCT            Discourage initiation of                                            antibiotics      Encourage discontinuation           of antibiotics    ----------------------------     -----------------------------         PCT >= 0.50 ng/mL              PCT 0.26 - 0.50 ng/mL               AND        <80% decrease in PCT             Encourage initiation of                                             antibiotics       Encourage continuation           of antibiotics    ----------------------------     -----------------------------        PCT >= 0.50 ng/mL                  PCT > 0.50 ng/mL               AND         increase in PCT  Strongly encourage                                      initiation of antibiotics    Strongly encourage escalation           of antibiotics                                     -----------------------------                                           PCT <= 0.25 ng/mL                                                 OR                                        > 80% decrease in PCT                                      Discontinue / Do not initiate                                             antibiotics  Performed at [facility], [address], [place], [name] [address]   Culture, blood (Routine x 2)     Status: None (Preliminary result)   Collection Time: 10/06/23  7:32 PM   Specimen: BLOOD  Result Value Ref Range   Specimen Description BLOOD BLOOD LEFT ARM    Special Requests      BOTTLES DRAWN AEROBIC AND ANAEROBIC Blood Culture results may not be optimal due to an inadequate volume of blood received in culture bottles   Culture      NO GROWTH < 12 HOURS Performed at [facility], [address], [place], [name] [address]    Report Status PENDING   I-Stat Lactic Acid, ED     Status: None   Collection Time: 10/06/23  7:39 PM  Result Value Ref Range   Lactic Acid, Venous 1.5 0.5 - 1.9 mmol/L  CBG  monitoring, ED     Status: Abnormal   Collection Time: 10/06/23 11:50 PM  Result Value Ref Range   Glucose-Capillary 110 (H) 70 - 99 mg/dL    Comment: Glucose reference range applies only to samples taken after fasting for at least 8 hours.  CBC with Differential/Platelet     Status: Abnormal   Collection Time: 10/07/23  4:05 AM  Result Value Ref Range   WBC 20.9 (H) 4.0 - 10.5 K/uL   RBC 4.57 3.87 - 5.11 MIL/uL   Hemoglobin 14.3 12.0 - 15.0 g/dL   HCT 55.5 63.9 - 53.9 %   MCV 97.2 80.0 - 100.0 fL   MCH 31.3 26.0 - 34.0 pg  MCHC 32.2 30.0 - 36.0 g/dL   RDW 85.7 88.4 - 84.4 %   Platelets 232 150 - 400 K/uL   nRBC 0.0 0.0 - 0.2 %   Neutrophils Relative % 81 %   Neutro Abs 17.0 (H) 1.7 - 7.7 K/uL   Lymphocytes Relative 8 %   Lymphs Abs 1.7 0.7 - 4.0 K/uL   Monocytes Relative 10 %   Monocytes Absolute 2.0 (H) 0.1 - 1.0 K/uL   Eosinophils Relative 0 %   Eosinophils Absolute 0.1 0.0 - 0.5 K/uL   Basophils Relative 0 %   Basophils Absolute 0.1 0.0 - 0.1 K/uL   Immature Granulocytes 1 %   Abs Immature Granulocytes 0.12 (H) 0.00 - 0.07 K/uL    Comment: Performed at [facility], [address] [place], [name] [address]  Comprehensive metabolic panel with GFR     Status: Abnormal   Collection Time: 10/07/23  4:05 AM  Result Value Ref Range   Sodium 132 (L) 135 - 145 mmol/L   Potassium 4.3 3.5 - 5.1 mmol/L   Chloride 99 98 - 111 mmol/L   CO2 23 22 - 32 mmol/L   Glucose, Bld 120 (H) 70 - 99 mg/dL    Comment: Glucose reference range applies only to samples taken after fasting for at least 8 hours.   BUN 21 8 - 23 mg/dL   Creatinine, Ser 8.02 (H) 0.44 - 1.00 mg/dL   Calcium  8.7 (L) 8.9 - 10.3 mg/dL   Total Protein 6.0 (L) 6.5 - 8.1 g/dL   Albumin  3.0 (L) 3.5 - 5.0 g/dL   AST 22 15 - 41 U/L   ALT 14 0 - 44 U/L   Alkaline Phosphatase 87 38 - 126 U/L   Total Bilirubin 1.0 0.0 - 1.2 mg/dL   GFR, Estimated 26 (L) >60 mL/min    Comment: (NOTE) Calculated using the CKD-EPI  Creatinine Equation (2021)    Anion gap 10 5 - 15    Comment: Performed at [facility], [address], [place], [name] [address]  Magnesium     Status: None   Collection Time: 10/07/23  4:05 AM  Result Value Ref Range   Magnesium 1.8 1.7 - 2.4 mg/dL    Comment: Performed at [facility], [address] [place], [name] [address]  Phosphorus     Status: None   Collection Time: 10/07/23  4:05 AM  Result Value Ref Range   Phosphorus 2.7 2.5 - 4.6 mg/dL    Comment: Performed at [facility], [address] [place], [name] [address]  Brain natriuretic peptide     Status: Abnormal   Collection Time: 10/07/23  4:05 AM  Result Value Ref Range   B Natriuretic Peptide 123.3 (H) 0.0 - 100.0 pg/mL    Comment: Performed at [facility], [address] [place], [name] [address]    DG Chest 2 View Result Date: 10/06/2023 CLINICAL DATA:  Hypoxia EXAM: CHEST - 2 VIEW COMPARISON:  Chest x-ray 02/11/2023 FINDINGS: The heart size and mediastinal contours are within normal limits. Both lungs are clear. The visualized skeletal structures are unremarkable. IMPRESSION: No active cardiopulmonary disease. Electronically Signed   By: Greig Pique M.D.   On: 10/06/2023 22:44   DG Tibia/Fibula Left Result Date: 10/06/2023 CLINICAL DATA:  Lower extremity pain.  Potential for osteomyelitis. EXAM: LEFT TIBIA AND FIBULA - 2 VIEW COMPARISON:  Study of 08/10/2023. FINDINGS: AP Lat two-view exam obtained  in 4 films, again reveals generalized edema in the mid and distal foreleg beginning in the mid calf continuing inferiorly and greater laterally. In the anterolateral mid foreleg there is a focally more prominent edema with 2 cm subcutaneous air pocket or soft tissue defect not seen previously. There is no radiopaque foreign body. No evidence of fractures or acute osteomyelitis. There is moderate arthrosis at the left knee and left ankle. Enthesopathic changes of the anterior patella. There is a  popcorn like calcification again noted in the popliteal fossa which would either represent a calcified popliteal lymph node or osteochondral loose body such as within the posterior joint space or in a popliteal cyst. There are patchy calcifications in the posterior tibial artery. IMPRESSION: 1. Generalized edema in the mid and distal foreleg, greater laterally. 2. Focally more prominent edema in the anterolateral mid foreleg with 2 cm subcutaneous air pocket or soft tissue defect not seen previously. 3. No evidence of fractures or acute osteomyelitis. 4. Moderate arthrosis at the left knee and left ankle. 5. Calcified popliteal lymph node versus osteochondral loose body in the posterior joint space or in a popliteal cyst. Electronically Signed   By: Francis Quam M.D.   On: 10/06/2023 21:02    Review of Systems  Constitutional:  Positive for chills and diaphoresis. Negative for fever.  HENT:  Negative for ear discharge, ear pain, hearing loss and tinnitus.   Eyes:  Negative for photophobia and pain.  Respiratory:  Negative for cough and shortness of breath.   Cardiovascular:  Negative for chest pain.  Gastrointestinal:  Negative for abdominal pain, nausea and vomiting.  Genitourinary:  Negative for dysuria, flank pain, frequency and urgency.  Musculoskeletal:  Positive for arthralgias (Left lower leg). Negative for back pain, myalgias and neck pain.  Neurological:  Negative for dizziness and headaches.  Hematological:  Does not bruise/bleed easily.  Psychiatric/Behavioral:  The patient is not nervous/anxious.    Blood pressure (!) 100/47, pulse 96, temperature 99.5 F (37.5 C), temperature source Oral, resp. rate 16, height 5' 5 (1.651 m), weight 102.6 kg, SpO2 94%. Physical Exam Constitutional:      General: She is not in acute distress.    Appearance: She is well-developed. She is not diaphoretic.  HENT:     Head: Normocephalic and atraumatic.  Eyes:     General: No scleral icterus.        Right eye: No discharge.        Left eye: No discharge.     Conjunctiva/sclera: Conjunctivae normal.  Cardiovascular:     Rate and Rhythm: Normal rate and regular rhythm.  Pulmonary:     Effort: Pulmonary effort is normal. No respiratory distress.  Musculoskeletal:     Cervical back: Normal range of motion.     Comments: LLE No traumatic wounds, ecchymosis, or rash  Ulceration ant lateral lower leg, no active discharge. Mod diffuse TTP, no fluctuance.  No knee or ankle effusion  Knee stable to varus/ valgus and anterior/posterior stress  Sens DPN, SPN, TN intact  Motor EHL, ext, flex, evers 5/5  DP 1+, PT 0, No significant edema  Skin:    General: Skin is warm and dry.  Neurological:     Mental Status: She is alert.  Psychiatric:        Mood and Affect: Mood normal.        Behavior: Behavior normal.     Assessment/Plan: LLE infection -- Will get MRI to evaluate deep space infection. Dr. Duda  to evaluate later today or in AM.    Ozell DOROTHA Ned, PA-C Orthopedic Surgery [phone] 10/07/2023, 9:03 AM

## 2023-10-07 NOTE — H&P (View-Only) (Signed)
 ORTHOPAEDIC CONSULTATION  REQUESTING PHYSICIAN: Trixie Nilda HERO, MD  Chief Complaint: Chronic ulcer and cellulitis left calf.  HPI: Kerry Powell is a 78 y.o. female who presents with cellulitis swelling and ulceration left calf.  Patient states she bumped her leg about a month ago.  She has had increased swelling pain redness and drainage.  Past Medical History:  Diagnosis Date   Allergy    Anemia    Anxiety    Arthritis    BACK PAIN, CHRONIC 04/04/2007   Qualifier: Diagnosis of  By: [name], [name]    BIPOLAR AFFECTIVE DISORDER 04/04/2007   Qualifier: History of  By: [name], [name] E    CAD (coronary artery disease), native coronary artery    mild CAD with no obstructive disease by coronary CTA 06/2019   Cataract    removed both eyes   Chronic neck pain 04/30/2015   Cervical spondylosis [facility] Intermittent numbness and tingling in occipital region and headaches S/p 2 occipital nerve blocks - only temp relief no relief with gabapentin, ultram , brace MRI neck - facet arthrosis    CKD (chronic kidney disease) 04/26/2015   COPD (chronic obstructive pulmonary disease) (HCC)    pt denies - told this while was smoking -   Cough 04/26/2015   Depression 07/01/2017   Diabetes (HCC) 04/27/2015   Diagnosed 03/2015    Dizziness 06/10/2017   Dysphagia 04/26/2015   DYSPNEA 12/24/2007   Qualifier: Diagnosis of  By: [name]     Elevated TSH 08/09/2015   GASTRIC ULCER, ACUTE 04/07/2007   Annotation: EGD 04/07/07 Qualifier: Diagnosis of  By: [name], [name] E    GASTRITIS 04/07/2007   Annotation: EGD 04/07/07 Qualifier: Diagnosis of  By: [name], [name] E    GERD (gastroesophageal reflux disease)    H/O renal cell carcinoma 05/23/2017   Dx 2010, s/p cryoablation Follows with urology - Dr [name]   HIATAL HERNIA 04/07/2007   Annotation: EGD 04/07/07 Qualifier: Diagnosis of  By: [name], [name] E    History of CVA (cerebrovascular accident)  04/26/2015   History of gout 04/26/2015   Hyperlipidemia    Hypertension    Hypoxia 03/06/2023   Irritable bowel syndrome 04/04/2007   Qualifier: Diagnosis of  By: [name], [name]    Kidney tumor (benign)    Nausea and vomiting 09/09/2017   Occipital neuralgia 05/09/2016   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 Dr [name] in [place]   OSTEOARTHRITIS 04/04/2007   Qualifier: Diagnosis of  By: [name], [name]    Personal history of colonic adenoma 06/07/2012   POSTNASAL DRIP SYNDROME 12/29/2007   Qualifier: Diagnosis of  By: [name], [name]     RESTLESS LEG SYNDROME 04/04/2007   Qualifier: Diagnosis of  By: [name], [name]    RHINOSINUSITIS, 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   Qualifier: Diagnosis of  By: [name], [name]    Sleep difficulties 09/09/2017   Snoring 04/26/2015   Stroke (HCC)    x2 2011   Thoracic aortic aneurysm (HCC) 02/06/2018   Seen on MRI 01/25/18 of thoracic spine ordered by [facility] -- referred to CTS to monitor  -- 3.5 cm   TIA (transient ischemic attack)    TOBACCO ABUSE 12/17/2007   Qualifier: Diagnosis of  By: [name], [name]     Trigeminal neuralgia of right side of face 02/08/2016   URI (upper respiratory infection) 02/08/2016  Vitamin D  deficiency 03/12/2018   Past Surgical History:  Procedure Laterality Date   ABDOMINAL HYSTERECTOMY     APPENDECTOMY     CATARACT EXTRACTION     COLONOSCOPY     CYSTOCELE REPAIR     ESOPHAGOGASTRODUODENOSCOPY     KIDNEY SURGERY     benign tumor removal   ORTHOPEDIC SURGERY     Wrist & elbow   POLYPECTOMY     RECTOCELE REPAIR     TONSILECTOMY, ADENOIDECTOMY, BILATERAL MYRINGOTOMY AND TUBES Bilateral 1968   UPPER GASTROINTESTINAL ENDOSCOPY     Social History   Socioeconomic History   Marital status: Married    Spouse name: Not on file   Number of children: 2   Years of education: Not on file   Highest education level: 12th grade  Occupational History   Occupation: Retired  Tobacco Use    Smoking status: Former    Current packs/day: 0.00    Average packs/day: 1 pack/day for 46.0 years (46.0 ttl pk-yrs)    Types: Cigarettes    Start date: 07/01/1963    Quit date: 06/30/2009    Years since quitting: 14.2    Passive exposure: Past   Smokeless tobacco: Never  Vaping Use   Vaping status: Never Used  Substance and Sexual Activity   Alcohol use: No   Drug use: No   Sexual activity: Not Currently  Other Topics Concern   Not on file  Social History Narrative   Married, retired   1 son 1 daughter   2 caffeine/day   2 story home, but doesn't have to use stairs - son stays upstairs   Social Drivers of Health   Financial Resource Strain: Low Risk  (06/04/2023)   Overall Financial Resource Strain (CARDIA)    Difficulty of Paying Living Expenses: Not hard at all  Food Insecurity: No Food Insecurity (10/07/2023)   Hunger Vital Sign    Worried About Running Out of Food in the Last Year: Never true    Ran Out of Food in the Last Year: Never true  Transportation Needs: No Transportation Needs (10/07/2023)   PRAPARE - Administrator, Civil Service (Medical): No    Lack of Transportation (Non-Medical): No  Physical Activity: Inactive (06/04/2023)   Exercise Vital Sign    Days of Exercise per Week: 0 days    Minutes of Exercise per Session: 40 min  Stress: Stress Concern Present (06/04/2023)   [employer] of Occupational Health - Occupational Stress Questionnaire    Feeling of Stress : To some extent  Social Connections: Socially Isolated (10/07/2023)   Social Connection and Isolation Panel    Frequency of Communication with Friends and Family: Once a week    Frequency of Social Gatherings with Friends and Family: Once a week    Attends Religious Services: Never    Database administrator or Organizations: No    Attends Engineer, structural: Never    Marital Status: Married   Family History  Problem Relation Age of Onset   Congestive Heart Failure Mother     Cancer Mother        ovarian   Heart disease Father    Diabetes Father    Kidney disease Father    Diabetes Sister    Kidney disease Sister    Colon cancer Neg Hx    Colon polyps Neg Hx    Esophageal cancer Neg Hx    Rectal cancer Neg Hx    Stomach  cancer Neg Hx    - negative except otherwise stated in the family history section Allergies  Allergen Reactions   Gabapentin Other (See Comments)    Fluid retention    Clindamycin  Rash   Sulfa Antibiotics Nausea And Vomiting   Prior to Admission medications   Medication Sig Start Date End Date Taking? Authorizing Provider  albuterol  (PROVENTIL ) (2.5 MG/3ML) 0.083% nebulizer solution USE 1 VIAL IN NEBULIZER EVERY 6 HOURS AND AS NEEDED FOR SHORT BREATH/WHEEZE.  Generic:  Ventolin  09/30/23  Yes Meade Verdon RAMAN, MD  allopurinol  (ZYLOPRIM ) 300 MG tablet Take 1 tablet by mouth once daily 09/02/23  Yes Burns, Glade PARAS, MD  amLODipine  (NORVASC ) 5 MG tablet TAKE 1 & 1/2 (ONE & ONE-HALF) TABLETS BY MOUTH ONCE DAILY Patient taking differently: Take 15 mg by mouth daily. TAKE 1 & 1/2 (ONE & ONE-HALF) TABLETS BY MOUTH ONCE DAILY 07/24/23  Yes Burns, Glade PARAS, MD  cetirizine  (ZYRTEC  ALLERGY) 10 MG tablet Take 10 mg by mouth daily as needed for allergies or rhinitis. 05/06/19  Yes [provider]  cholecalciferol (VITAMIN D3) 25 MCG (1000 UNIT) tablet Take 2,000 Units by mouth daily.   Yes [provider]  clopidogrel  (PLAVIX ) 75 MG tablet Take 1 tablet by mouth once daily 09/02/23  Yes Burns, Glade PARAS, MD  cyclobenzaprine  (FLEXERIL ) 5 MG tablet Take 1 tablet (5 mg total) by mouth 3 (three) times daily as needed. for muscle spams 03/31/23  Yes Burns, Glade PARAS, MD  DULoxetine  (CYMBALTA ) 30 MG capsule Take 1 capsule (30 mg total) by mouth daily. Patient taking differently: Take 60 mg by mouth daily. 07/24/23  Yes Burns, Glade PARAS, MD  FARXIGA  10 MG TABS tablet Take 1 tablet (10 mg total) by mouth daily. Take 10 mg by mouth daily. 03/21/23  Yes Burns,  Glade PARAS, MD  ferrous sulfate  325 (65 FE) MG tablet Take 325 mg by mouth every other day.   Yes [provider]  fluticasone  (FLONASE ) 50 MCG/ACT nasal spray Place 2 sprays into both nostrils daily. Patient taking differently: Place 2 sprays into both nostrils daily as needed for allergies. 10/03/23  Yes Soldatova, Liuba, MD  furosemide  (LASIX ) 20 MG tablet Take 1 tablet (20 mg total) by mouth daily as needed for fluid or edema. 03/06/23  Yes Burns, Glade PARAS, MD  HYDROcodone  bit-homatropine (HYCODAN) 5-1.5 MG/5ML syrup Take 5 mLs by mouth every 8 (eight) hours as needed for cough. 08/12/23  Yes Burns, Glade PARAS, MD  metoprolol  tartrate (LOPRESSOR ) 25 MG tablet Take 1 tablet (25 mg total) by mouth 2 (two) times daily. May take extra 25 mg tablet for as needed palpitations 02/12/23  Yes Odell Celinda Balo, MD  montelukast  (SINGULAIR ) 10 MG tablet TAKE 1 TABLET BY MOUTH ONCE DAILY AT BEDTIME 07/25/23  Yes Burns, Glade PARAS, MD  MOUNJARO  5 MG/0.5ML Pen INJECT 1/2 (ONE-HALF) ML SUBCUTANEOUSLY  ONCE A WEEK 09/23/23  Yes Geofm Glade PARAS, MD  pantoprazole  (PROTONIX ) 40 MG tablet Take 1 tablet (40 mg total) by mouth 2 (two) times daily before a meal. 02/01/23  Yes Burns, Glade PARAS, MD  rosuvastatin  (CRESTOR ) 40 MG tablet Take 1 tablet by mouth once daily 09/02/23  Yes Turner, Wilbert SAUNDERS, MD  sodium chloride  (OCEAN) 0.65 % SOLN nasal spray Place 1 spray into both nostrils as needed for congestion. 07/29/23  Yes Meade Verdon RAMAN, MD  telmisartan  (MICARDIS ) 40 MG tablet Take 1 tablet (40 mg total) by mouth daily. 08/26/23  Yes Burns, Glade PARAS,  MD  traMADol  (ULTRAM ) 50 MG tablet Take 50 mg by mouth 3 (three) times daily as needed. for pain 09/18/23  Yes [provider]  levocetirizine (XYZAL  ALLERGY 24HR) 5 MG tablet Take 1 tablet (5 mg total) by mouth every evening. Patient not taking: Reported on 10/06/2023 10/03/23   Okey Burns, MD   MR TIBIA FIBULA LEFT WO CONTRAST Result Date: 10/07/2023 MR TIBIA AND FIBULA  WITHOUT IV CONTRAST COMPARISON: None. CLINICAL HISTORY: Soft tissue infection suspected. PULSE SEQUENCES: Ax T1, Ax T2 FS, Sag T1, Sag T2 FS, Cor STIR, Cor T1 without contrast. FINDINGS: Bones: Mild degenerative changes are seen left knee with tricompartmental osteoarthrosis and reactive joint effusion. There is a small Baker's cyst. Otherwise, the bones are unremarkable. There is no marrow edema or evidence of osteomyelitis. Musculotendinous structures: Musculotendinous structures demonstrate no significant myositis or fatty atrophy. No deep collection is present. There is moderate pretibial and lateral subcutaneous edema. There is suggestion of mild more focal collections along the anterior fascia in the mid pretibial region best seen on axial image 29 through 40 of series 14. It is difficult to determine if this could represent a more focal abscess or simply a phlegmonous process. Other correlation with MRI of the left tib-fib with IV contrast or focused ultrasound of the mid pretibial soft tissues to determine if there is a collection that can be sampled. IMPRESSION: Moderate focal pretibial and lateral subcutaneous edema in the mid left lower extremity. There is a more focal collection along the anterior compartment fascia which is indeterminate. This could reflect an abscess in the appropriate clinical setting. See above for more detail. Focused ultrasound with sampling if clinically indicated. No deep involvement or osseous involvement at the current time. Electronically signed by: Norleen Satchel MD 10/07/2023 11:14 AM EDT RP Workstation: [id number]   DG Chest 2 View Result Date: 10/06/2023 CLINICAL DATA:  Hypoxia EXAM: CHEST - 2 VIEW COMPARISON:  Chest x-ray 02/11/2023 FINDINGS: The heart size and mediastinal contours are within normal limits. Both lungs are clear. The visualized skeletal structures are unremarkable. IMPRESSION: No active cardiopulmonary disease. Electronically Signed   By: Greig Pique  M.D.   On: 10/06/2023 22:44   DG Tibia/Fibula Left Result Date: 10/06/2023 CLINICAL DATA:  Lower extremity pain.  Potential for osteomyelitis. EXAM: LEFT TIBIA AND FIBULA - 2 VIEW COMPARISON:  Study of 08/10/2023. FINDINGS: AP Lat two-view exam obtained in 4 films, again reveals generalized edema in the mid and distal foreleg beginning in the mid calf continuing inferiorly and greater laterally. In the anterolateral mid foreleg there is a focally more prominent edema with 2 cm subcutaneous air pocket or soft tissue defect not seen previously. There is no radiopaque foreign body. No evidence of fractures or acute osteomyelitis. There is moderate arthrosis at the left knee and left ankle. Enthesopathic changes of the anterior patella. There is a popcorn like calcification again noted in the popliteal fossa which would either represent a calcified popliteal lymph node or osteochondral loose body such as within the posterior joint space or in a popliteal cyst. There are patchy calcifications in the posterior tibial artery. IMPRESSION: 1. Generalized edema in the mid and distal foreleg, greater laterally. 2. Focally more prominent edema in the anterolateral mid foreleg with 2 cm subcutaneous air pocket or soft tissue defect not seen previously. 3. No evidence of fractures or acute osteomyelitis. 4. Moderate arthrosis at the left knee and left ankle. 5. Calcified popliteal lymph node versus osteochondral loose body in the  posterior joint space or in a popliteal cyst. Electronically Signed   By: Francis Quam M.D.   On: 10/06/2023 21:02   - pertinent xrays, CT, MRI studies were reviewed and independently interpreted  Positive ROS: All other systems have been reviewed and were otherwise negative with the exception of those mentioned in the HPI and as above.  Physical Exam: General: Alert, no acute distress Psychiatric: Patient is competent for consent with normal mood and affect Lymphatic: No axillary or  cervical lymphadenopathy Cardiovascular: No pedal edema Respiratory: No cyanosis, no use of accessory musculature GI: No organomegaly, abdomen is soft and non-tender    Images:  @ENCIMAGES @  Labs:  Lab Results  Component Value Date   HGBA1C 7.2 (H) 06/05/2023   HGBA1C 7.0 (H) 12/05/2022   HGBA1C 7.1 (H) 05/15/2022   ESRSEDRATE 16 08/01/2020   ESRSEDRATE 76 (H) 12/07/2019   CRP 0.5 09/30/2020   CRP 2.0 (H) 09/27/2020   CRP 3.1 (H) 09/26/2020   LABURIC 4.3 05/15/2022   LABURIC 3.4 05/09/2016   REPTSTATUS PENDING 10/06/2023   CULT  10/06/2023    NO GROWTH < 12 HOURS Performed at [facility], [address] [place], [name] [address]     Lab Results  Component Value Date   ALBUMIN 3.0 (L) 10/07/2023   ALBUMIN 3.5 10/06/2023   ALBUMIN 4.3 06/05/2023   LABURIC 4.3 05/15/2022   LABURIC 3.4 05/09/2016        Latest Ref Rng & Units 10/07/2023    4:05 AM 10/06/2023    7:08 PM 06/05/2023    3:20 PM  CBC EXTENDED  WBC 4.0 - 10.5 K/uL 20.9  20.6  9.8   RBC 3.87 - 5.11 MIL/uL 4.57  5.11  4.62   Hemoglobin 12.0 - 15.0 g/dL 85.6  84.0  85.0   HCT 36.0 - 46.0 % 44.4  49.6  46.3   Platelets 150 - 400 K/uL 232  287  267.0   NEUT# 1.7 - 7.7 K/uL 17.0  16.6  6.7   Lymph# 0.7 - 4.0 K/uL 1.7  1.7  2.1     Neurologic: Patient does not have protective sensation bilateral lower extremities.   MUSCULOSKELETAL:   Skin: Examination there is a large area of cellulitis over the anterior lateral aspect of the left calf.  There is an ulcer patient states she has had clear serosanguineous drainage.  Patient has a palpable dorsalis pedis pulse.  Review of the MRI scan shows a large fluid collection beneath the subcutaneous tissue.  With the overlying cellulitis and ulcer this seems most consistent with an infected hematoma.  White cell count 20.9.  Hemoglobin A1c 7.2.  Assessment: Assessment: Infected hematoma left calf with cellulitis and elevated white cell  count.  Plan: Plan: Will plan for surgical debridement of the the infected hematoma.  Risk and benefits were discussed including risk of the wound not healing.  Will plan for surgery on Wednesday.  Thank you for the consult and the opportunity to see Ms. Eveline Jerona Sage, MD [facility] [phone] 6:01 PM

## 2023-10-07 NOTE — Progress Notes (Signed)
 Pharmacy Antibiotic Note  Kerry Powell is a 78 y.o. female admitted on 10/06/2023 with cellulitis.  Pharmacy has been consulted for Vancomycin  dosing. WBC is elevated. Noted renal dysfunction.   Plan: Vancomycin  1000 mg IV q48h >>>Estimated AUC: 436 Ceftriaxone  per MD Trend WBC, temp, renal function  F/U infectious work-up Drug levels as indicated   Height: 5' 5 (165.1 cm) Weight: 102.6 kg (226 lb 3.1 oz) IBW/kg (Calculated) : 57  Temp (24hrs), Avg:99.4 F (37.4 C), Min:98.4 F (36.9 C), Max:100.3 F (37.9 C)  Recent Labs  Lab 10/06/23 1908 10/06/23 1939  WBC 20.6*  --   CREATININE 1.93*  --   LATICACIDVEN  --  1.5    Estimated Creatinine Clearance: 28.5 mL/min (A) (by C-G formula based on SCr of 1.93 mg/dL (H)).    Allergies  Allergen Reactions   Gabapentin Other (See Comments)    Fluid retention    Clindamycin  Rash   Sulfa Antibiotics Nausea And Vomiting    Lynwood Mckusick, PharmD, BCPS Clinical Pharmacist Phone: [phone]

## 2023-10-07 NOTE — Progress Notes (Signed)
 PROGRESS NOTE  Kerry Powell FMW:[id number] DOB: 01-Mar-1945 DOA: 10/06/2023 PCP: Geofm Glade PARAS, MD   LOS: 1 day   Brief Narrative / Interim history: 78 year old female with DM2, chronic diastolic CHF, COPD, CKD 3B with baseline creatinine 1.5-2.0, who comes into the hospital with left lower extremity pain and swelling.  She had an injury to her left shin about 3 weeks ago, formed a scab and about 5 days ago the scab fell off.  She has been having worsening pain, redness, swelling since and eventually came to the hospital and was admitted for cellulitis and placed on IV antibiotics.  Of note, she was found to be hypoxemic requiring 3 L of oxygen  Subjective / 24h Interval events: She complains of left leg pain, denies any shortness of breath.  No chest discomfort, no nausea or vomiting  Assesement and Plan: Principal Problem:   Cellulitis of left lower extremity Active Problems:   DM2 (diabetes mellitus, type 2) (HCC)   Acute hypoxic respiratory failure (HCC)   COPD with acute exacerbation (HCC)   CKD stage 3b, GFR 30-44 ml/min (HCC)   Chronic diastolic CHF (congestive heart failure) (HCC)   Severe sepsis (HCC)   Acute hyponatremia   Sterile pyuria   Principal problem Sepsis due to left lower extremity cellulitis -met sepsis criteria on admission with leukocytosis, tachycardia, and the source.  She is with significant swelling this morning, orthopedic surgery consulted, awaiting input.  For now continue antibiotics  Active problems Hypoxemia-with concern for COPD exacerbation, however she denies having any wheezing.  She is asymptomatic, apparently she was 86% on room air.  I wonder whether this is somewhat chronic rather than acute.  Chest x-ray unremarkable.  Lung exam is unremarkable this morning.  Discontinue steroids in the setting of an infection, continue DuoNebs - Wean off to room air as tolerated  DM2-most recent A1c 7.2 which is acceptable in her age group.  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  She would benefit from weight loss  Hyperlipidemia-continue statin  Scheduled Meds:  clopidogrel   75 mg Oral Daily   heparin   5,000 Units Subcutaneous Q8H   insulin  aspart  0-5 Units Subcutaneous QHS   insulin  aspart  0-9 Units Subcutaneous TID WC   ipratropium-albuterol   3 mL Nebulization Q6H   pantoprazole  (PROTONIX ) IV  40 mg Intravenous Q24H   rosuvastatin   40 mg Oral Daily   Continuous Infusions:  cefTRIAXone  (ROCEPHIN )  IV     lactated ringers  75 mL/hr at 10/06/23 2358   [START ON 10/08/2023] vancomycin  (VANCOCIN ) 1,000 mg in sodium chloride  0.9 % 250 mL IVPB     PRN Meds:.acetaminophen  **OR** acetaminophen , albuterol , fentaNYL  (SUBLIMAZE ) injection, melatonin, naLOXone  (NARCAN )  injection, ondansetron  (ZOFRAN ) IV  Current Outpatient Medications  Medication Instructions   albuterol  (PROVENTIL ) (2.5 MG/3ML) 0.083% nebulizer solution USE 1 VIAL IN NEBULIZER EVERY 6 HOURS AND AS NEEDED FOR SHORT BREATH/WHEEZE.  Generic:  Ventolin    allopurinol  (ZYLOPRIM ) 300 mg, Oral, Daily   amLODipine  (NORVASC ) 5 MG tablet TAKE 1 & 1/2 (ONE & ONE-HALF) TABLETS BY MOUTH ONCE DAILY   cetirizine  (ZYRTEC  ALLERGY) 10 mg, Oral, Daily PRN   cholecalciferol (VITAMIN D3) 2,000 Units, Oral, Daily   clopidogrel  (PLAVIX ) 75 mg, Oral, Daily   cyclobenzaprine  (FLEXERIL ) 5 mg, Oral, 3 times daily PRN, for muscle spams   DULoxetine  (CYMBALTA ) 30 mg, Oral, Daily   Farxiga  10 mg, Oral,  Daily, Take 10 mg by mouth daily.   ferrous sulfate  325 mg, Oral, Every other day   fluticasone  (FLONASE ) 50 MCG/ACT nasal spray 2 sprays, Each Nare, Daily   furosemide  (LASIX ) 20 mg, Oral, Daily PRN   HYDROcodone  bit-homatropine (HYCODAN) 5-1.5 MG/5ML syrup 5 mLs, Oral, Every 8 hours PRN   levocetirizine (XYZAL  ALLERGY 24HR) 5 mg, Oral, Every evening   metoprolol  tartrate  (LOPRESSOR ) 25 mg, Oral, 2 times daily, May take extra 25 mg tablet for as needed palpitations   montelukast  (SINGULAIR ) 10 mg, Oral, Daily at bedtime   MOUNJARO  5 MG/0.5ML Pen INJECT 1/2 (ONE-HALF) ML SUBCUTANEOUSLY  ONCE A WEEK   pantoprazole  (PROTONIX ) 40 mg, Oral, 2 times daily before meals   rosuvastatin  (CRESTOR ) 40 mg, Oral, Daily   sodium chloride  (OCEAN) 0.65 % SOLN nasal spray 1 spray, Each Nare, As needed   telmisartan  (MICARDIS ) 40 mg, Oral, Daily   traMADol  (ULTRAM ) 50 mg, Oral, 3 times daily PRN, for pain    Diet Orders (From admission, onward)     Start     Ordered   10/06/23 2228  Diet regular Room service appropriate? Yes; Fluid consistency: Thin  Diet effective now       Question Answer Comment  Room service appropriate? Yes   Fluid consistency: Thin      10/06/23 2227            DVT prophylaxis: heparin  injection 5,000 Units Start: 10/06/23 2230   Lab Results  Component Value Date   PLT 232 10/07/2023      Code Status: Limited: Do not attempt resuscitation (DNR) -DNR-LIMITED -Do Not Intubate/DNI   Family Communication: no family at bedside   Status is: Inpatient Remains inpatient appropriate because: IV antibiotics   Level of care: Progressive  Consultants:  Orthopedic surgery  Objective: Vitals:   10/07/23 0500 10/07/23 0600 10/07/23 0745 10/07/23 0800  BP: (!) 97/57 97/60 (!) 100/47   Pulse: (!) 102 (!) 104 96   Resp: (!) 25 19 16    Temp:    99.5 F (37.5 C)  TempSrc:    Oral  SpO2: 92% 92% 94%   Weight:      Height:        Intake/Output Summary (Last 24 hours) at 10/07/2023 9176 Last data filed at 10/06/2023 2245 Gross per 24 hour  Intake 1300 ml  Output --  Net 1300 ml   Wt Readings from Last 3 Encounters:  10/06/23 102.6 kg  08/10/23 102.6 kg  08/05/23 102.7 kg    Examination:  Constitutional: NAD Eyes: no scleral icterus ENMT: Mucous membranes are moist.  Neck: normal, supple Respiratory: clear to auscultation  bilaterally, no wheezing, no crackles. Normal respiratory effort. No accessory muscle use.  Cardiovascular: Regular rate and rhythm, no murmurs / rubs / gallops. No LE edema.  Abdomen: non distended, no tenderness. Bowel sounds positive.  Musculoskeletal: no clubbing / cyanosis.  Skin: no rashes Neurologic: non focal   Data Reviewed: I have independently reviewed following labs and imaging studies   CBC Recent Labs  Lab 10/06/23 1908 10/07/23 0405  WBC 20.6* 20.9*  HGB 15.9* 14.3  HCT 49.6* 44.4  PLT 287 232  MCV 97.1 97.2  MCH 31.1 31.3  MCHC 32.1 32.2  RDW 14.0 14.2  LYMPHSABS 1.7 1.7  MONOABS 2.0* 2.0*  EOSABS 0.1 0.1  BASOSABS 0.1 0.1    Recent Labs  Lab 10/06/23 1908 10/06/23 1922 10/06/23 1939 10/07/23 0405  NA 130*  --   --  132*  K 4.3  --   --  4.3  CL 99  --   --  99  CO2 19*  --   --  23  GLUCOSE 140*  --   --  120*  BUN 21  --   --  21  CREATININE 1.93*  --   --  1.97*  CALCIUM  9.1  --   --  8.7*  AST 16  --   --  22  ALT 12  --   --  14  ALKPHOS 93  --   --  87  BILITOT 0.9  --   --  1.0  ALBUMIN  3.5  --   --  3.0*  MG  --  1.7  --  1.8  PROCALCITON  --  0.13  --   --   LATICACIDVEN  --   --  1.5  --   INR 1.0  --   --   --   BNP 92.7  --   --  123.3*    ------------------------------------------------------------------------------------------------------------------ No results for input(s): CHOL, HDL, LDLCALC, TRIG, CHOLHDL, LDLDIRECT in the last 72 hours.  Lab Results  Component Value Date   HGBA1C 7.2 (H) 06/05/2023   ------------------------------------------------------------------------------------------------------------------ No results for input(s): TSH, T4TOTAL, T3FREE, THYROIDAB in the last 72 hours.  Invalid input(s): FREET3  Cardiac Enzymes No results for input(s): CKMB, TROPONINI, MYOGLOBIN in the last 168 hours.  Invalid input(s):  CK ------------------------------------------------------------------------------------------------------------------    Component Value Date/Time   BNP 123.3 (H) 10/07/2023 0405    CBG: Recent Labs  Lab 10/06/23 2350  GLUCAP 110*    Recent Results (from the past 240 hours)  Culture, blood (Routine x 2)     Status: None (Preliminary result)   Collection Time: 10/06/23  7:08 PM   Specimen: BLOOD  Result Value Ref Range Status   Specimen Description BLOOD RIGHT ANTECUBITAL  Final   Special Requests AEROBIC BOTTLE ONLY Blood Culture adequate volume  Final   Culture   Final    NO GROWTH < 12 HOURS Performed at [facility], [address], [place], [name] [address]    Report Status PENDING  Incomplete  Culture, blood (Routine x 2)     Status: None (Preliminary result)   Collection Time: 10/06/23  7:32 PM   Specimen: BLOOD  Result Value Ref Range Status   Specimen Description BLOOD BLOOD LEFT ARM  Final   Special Requests   Final    BOTTLES DRAWN AEROBIC AND ANAEROBIC Blood Culture results may not be optimal due to an inadequate volume of blood received in culture bottles   Culture   Final    NO GROWTH < 12 HOURS Performed at [facility], [address] [place], [name] [address]    Report Status PENDING  Incomplete     Radiology Studies: DG Chest 2 View Result Date: 10/06/2023 CLINICAL DATA:  Hypoxia EXAM: CHEST - 2 VIEW COMPARISON:  Chest x-ray 02/11/2023 FINDINGS: The heart size and mediastinal contours are within normal limits. Both lungs are clear. The visualized skeletal structures are unremarkable. IMPRESSION: No active cardiopulmonary disease. Electronically Signed   By: Greig Pique M.D.   On: 10/06/2023 22:44   DG Tibia/Fibula Left Result Date: 10/06/2023 CLINICAL DATA:  Lower extremity pain.  Potential for osteomyelitis. EXAM: LEFT TIBIA AND FIBULA - 2 VIEW COMPARISON:  Study of 08/10/2023. FINDINGS: AP Lat two-view exam obtained in 4 films, again  reveals generalized edema in the mid  and distal foreleg beginning in the mid calf continuing inferiorly and greater laterally. In the anterolateral mid foreleg there is a focally more prominent edema with 2 cm subcutaneous air pocket or soft tissue defect not seen previously. There is no radiopaque foreign body. No evidence of fractures or acute osteomyelitis. There is moderate arthrosis at the left knee and left ankle. Enthesopathic changes of the anterior patella. There is a popcorn like calcification again noted in the popliteal fossa which would either represent a calcified popliteal lymph node or osteochondral loose body such as within the posterior joint space or in a popliteal cyst. There are patchy calcifications in the posterior tibial artery. IMPRESSION: 1. Generalized edema in the mid and distal foreleg, greater laterally. 2. Focally more prominent edema in the anterolateral mid foreleg with 2 cm subcutaneous air pocket or soft tissue defect not seen previously. 3. No evidence of fractures or acute osteomyelitis. 4. Moderate arthrosis at the left knee and left ankle. 5. Calcified popliteal lymph node versus osteochondral loose body in the posterior joint space or in a popliteal cyst. Electronically Signed   By: Francis Quam M.D.   On: 10/06/2023 21:02     Nilda Fendt, MD, PhD [facility]  Between 7 am - 7 pm I am available, please contact me via Amion (for emergencies) or Securechat (non urgent messages)  Between 7 pm - 7 am I am not available, please contact night coverage MD/APP via Amion

## 2023-10-07 NOTE — Consult Note (Signed)
 ORTHOPAEDIC CONSULTATION  REQUESTING PHYSICIAN: Trixie Nilda HERO, MD  Chief Complaint: Chronic ulcer and cellulitis left calf.  HPI: Kerry Powell is a 78 y.o. female who presents with cellulitis swelling and ulceration left calf.  Patient states she bumped her leg about a month ago.  She has had increased swelling pain redness and drainage.  Past Medical History:  Diagnosis Date   Allergy    Anemia    Anxiety    Arthritis    BACK PAIN, CHRONIC 04/04/2007   Qualifier: Diagnosis of  By: [name], [name]    BIPOLAR AFFECTIVE DISORDER 04/04/2007   Qualifier: History of  By: [name], [name] E    CAD (coronary artery disease), native coronary artery    mild CAD with no obstructive disease by coronary CTA 06/2019   Cataract    removed both eyes   Chronic neck pain 04/30/2015   Cervical spondylosis [facility] Intermittent numbness and tingling in occipital region and headaches S/p 2 occipital nerve blocks - only temp relief no relief with gabapentin, ultram , brace MRI neck - facet arthrosis    CKD (chronic kidney disease) 04/26/2015   COPD (chronic obstructive pulmonary disease) (HCC)    pt denies - told this while was smoking -   Cough 04/26/2015   Depression 07/01/2017   Diabetes (HCC) 04/27/2015   Diagnosed 03/2015    Dizziness 06/10/2017   Dysphagia 04/26/2015   DYSPNEA 12/24/2007   Qualifier: Diagnosis of  By: [name]     Elevated TSH 08/09/2015   GASTRIC ULCER, ACUTE 04/07/2007   Annotation: EGD 04/07/07 Qualifier: Diagnosis of  By: [name], [name] E    GASTRITIS 04/07/2007   Annotation: EGD 04/07/07 Qualifier: Diagnosis of  By: [name], [name] E    GERD (gastroesophageal reflux disease)    H/O renal cell carcinoma 05/23/2017   Dx 2010, s/p cryoablation Follows with urology - Dr [name]   HIATAL HERNIA 04/07/2007   Annotation: EGD 04/07/07 Qualifier: Diagnosis of  By: [name], [name] E    History of CVA (cerebrovascular accident)  04/26/2015   History of gout 04/26/2015   Hyperlipidemia    Hypertension    Hypoxia 03/06/2023   Irritable bowel syndrome 04/04/2007   Qualifier: Diagnosis of  By: [name], [name]    Kidney tumor (benign)    Nausea and vomiting 09/09/2017   Occipital neuralgia 05/09/2016   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 Dr [name] in [place]   OSTEOARTHRITIS 04/04/2007   Qualifier: Diagnosis of  By: [name], [name]    Personal history of colonic adenoma 06/07/2012   POSTNASAL DRIP SYNDROME 12/29/2007   Qualifier: Diagnosis of  By: [name], [name]     RESTLESS LEG SYNDROME 04/04/2007   Qualifier: Diagnosis of  By: [name], [name]    RHINOSINUSITIS, 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   Qualifier: Diagnosis of  By: [name], [name]    Sleep difficulties 09/09/2017   Snoring 04/26/2015   Stroke (HCC)    x2 2011   Thoracic aortic aneurysm (HCC) 02/06/2018   Seen on MRI 01/25/18 of thoracic spine ordered by [facility] -- referred to CTS to monitor  -- 3.5 cm   TIA (transient ischemic attack)    TOBACCO ABUSE 12/17/2007   Qualifier: Diagnosis of  By: [name], [name]     Trigeminal neuralgia of right side of face 02/08/2016   URI (upper respiratory infection) 02/08/2016  Vitamin D  deficiency 03/12/2018   Past Surgical History:  Procedure Laterality Date   ABDOMINAL HYSTERECTOMY     APPENDECTOMY     CATARACT EXTRACTION     COLONOSCOPY     CYSTOCELE REPAIR     ESOPHAGOGASTRODUODENOSCOPY     KIDNEY SURGERY     benign tumor removal   ORTHOPEDIC SURGERY     Wrist & elbow   POLYPECTOMY     RECTOCELE REPAIR     TONSILECTOMY, ADENOIDECTOMY, BILATERAL MYRINGOTOMY AND TUBES Bilateral 1968   UPPER GASTROINTESTINAL ENDOSCOPY     Social History   Socioeconomic History   Marital status: Married    Spouse name: Not on file   Number of children: 2   Years of education: Not on file   Highest education level: 12th grade  Occupational History   Occupation: Retired  Tobacco Use    Smoking status: Former    Current packs/day: 0.00    Average packs/day: 1 pack/day for 46.0 years (46.0 ttl pk-yrs)    Types: Cigarettes    Start date: 07/01/1963    Quit date: 06/30/2009    Years since quitting: 14.2    Passive exposure: Past   Smokeless tobacco: Never  Vaping Use   Vaping status: Never Used  Substance and Sexual Activity   Alcohol use: No   Drug use: No   Sexual activity: Not Currently  Other Topics Concern   Not on file  Social History Narrative   Married, retired   1 son 1 daughter   2 caffeine/day   2 story home, but doesn't have to use stairs - son stays upstairs   Social Drivers of Health   Financial Resource Strain: Low Risk  (06/04/2023)   Overall Financial Resource Strain (CARDIA)    Difficulty of Paying Living Expenses: Not hard at all  Food Insecurity: No Food Insecurity (10/07/2023)   Hunger Vital Sign    Worried About Running Out of Food in the Last Year: Never true    Ran Out of Food in the Last Year: Never true  Transportation Needs: No Transportation Needs (10/07/2023)   PRAPARE - Administrator, Civil Service (Medical): No    Lack of Transportation (Non-Medical): No  Physical Activity: Inactive (06/04/2023)   Exercise Vital Sign    Days of Exercise per Week: 0 days    Minutes of Exercise per Session: 40 min  Stress: Stress Concern Present (06/04/2023)   [employer] of Occupational Health - Occupational Stress Questionnaire    Feeling of Stress : To some extent  Social Connections: Socially Isolated (10/07/2023)   Social Connection and Isolation Panel    Frequency of Communication with Friends and Family: Once a week    Frequency of Social Gatherings with Friends and Family: Once a week    Attends Religious Services: Never    Database administrator or Organizations: No    Attends Engineer, structural: Never    Marital Status: Married   Family History  Problem Relation Age of Onset   Congestive Heart Failure Mother     Cancer Mother        ovarian   Heart disease Father    Diabetes Father    Kidney disease Father    Diabetes Sister    Kidney disease Sister    Colon cancer Neg Hx    Colon polyps Neg Hx    Esophageal cancer Neg Hx    Rectal cancer Neg Hx    Stomach  cancer Neg Hx    - negative except otherwise stated in the family history section Allergies  Allergen Reactions   Gabapentin Other (See Comments)    Fluid retention    Clindamycin  Rash   Sulfa Antibiotics Nausea And Vomiting   Prior to Admission medications   Medication Sig Start Date End Date Taking? Authorizing Provider  albuterol  (PROVENTIL ) (2.5 MG/3ML) 0.083% nebulizer solution USE 1 VIAL IN NEBULIZER EVERY 6 HOURS AND AS NEEDED FOR SHORT BREATH/WHEEZE.  Generic:  Ventolin  09/30/23  Yes Meade Verdon RAMAN, MD  allopurinol  (ZYLOPRIM ) 300 MG tablet Take 1 tablet by mouth once daily 09/02/23  Yes Burns, Glade PARAS, MD  amLODipine  (NORVASC ) 5 MG tablet TAKE 1 & 1/2 (ONE & ONE-HALF) TABLETS BY MOUTH ONCE DAILY Patient taking differently: Take 15 mg by mouth daily. TAKE 1 & 1/2 (ONE & ONE-HALF) TABLETS BY MOUTH ONCE DAILY 07/24/23  Yes Burns, Glade PARAS, MD  cetirizine  (ZYRTEC  ALLERGY) 10 MG tablet Take 10 mg by mouth daily as needed for allergies or rhinitis. 05/06/19  Yes [provider]  cholecalciferol (VITAMIN D3) 25 MCG (1000 UNIT) tablet Take 2,000 Units by mouth daily.   Yes [provider]  clopidogrel  (PLAVIX ) 75 MG tablet Take 1 tablet by mouth once daily 09/02/23  Yes Burns, Glade PARAS, MD  cyclobenzaprine  (FLEXERIL ) 5 MG tablet Take 1 tablet (5 mg total) by mouth 3 (three) times daily as needed. for muscle spams 03/31/23  Yes Burns, Glade PARAS, MD  DULoxetine  (CYMBALTA ) 30 MG capsule Take 1 capsule (30 mg total) by mouth daily. Patient taking differently: Take 60 mg by mouth daily. 07/24/23  Yes Burns, Glade PARAS, MD  FARXIGA  10 MG TABS tablet Take 1 tablet (10 mg total) by mouth daily. Take 10 mg by mouth daily. 03/21/23  Yes Burns,  Glade PARAS, MD  ferrous sulfate  325 (65 FE) MG tablet Take 325 mg by mouth every other day.   Yes [provider]  fluticasone  (FLONASE ) 50 MCG/ACT nasal spray Place 2 sprays into both nostrils daily. Patient taking differently: Place 2 sprays into both nostrils daily as needed for allergies. 10/03/23  Yes Soldatova, Liuba, MD  furosemide  (LASIX ) 20 MG tablet Take 1 tablet (20 mg total) by mouth daily as needed for fluid or edema. 03/06/23  Yes Burns, Glade PARAS, MD  HYDROcodone  bit-homatropine (HYCODAN) 5-1.5 MG/5ML syrup Take 5 mLs by mouth every 8 (eight) hours as needed for cough. 08/12/23  Yes Burns, Glade PARAS, MD  metoprolol  tartrate (LOPRESSOR ) 25 MG tablet Take 1 tablet (25 mg total) by mouth 2 (two) times daily. May take extra 25 mg tablet for as needed palpitations 02/12/23  Yes Odell Celinda Balo, MD  montelukast  (SINGULAIR ) 10 MG tablet TAKE 1 TABLET BY MOUTH ONCE DAILY AT BEDTIME 07/25/23  Yes Burns, Glade PARAS, MD  MOUNJARO  5 MG/0.5ML Pen INJECT 1/2 (ONE-HALF) ML SUBCUTANEOUSLY  ONCE A WEEK 09/23/23  Yes Geofm Glade PARAS, MD  pantoprazole  (PROTONIX ) 40 MG tablet Take 1 tablet (40 mg total) by mouth 2 (two) times daily before a meal. 02/01/23  Yes Burns, Glade PARAS, MD  rosuvastatin  (CRESTOR ) 40 MG tablet Take 1 tablet by mouth once daily 09/02/23  Yes Turner, Wilbert SAUNDERS, MD  sodium chloride  (OCEAN) 0.65 % SOLN nasal spray Place 1 spray into both nostrils as needed for congestion. 07/29/23  Yes Meade Verdon RAMAN, MD  telmisartan  (MICARDIS ) 40 MG tablet Take 1 tablet (40 mg total) by mouth daily. 08/26/23  Yes Burns, Glade PARAS,  MD  traMADol  (ULTRAM ) 50 MG tablet Take 50 mg by mouth 3 (three) times daily as needed. for pain 09/18/23  Yes [provider]  levocetirizine (XYZAL  ALLERGY 24HR) 5 MG tablet Take 1 tablet (5 mg total) by mouth every evening. Patient not taking: Reported on 10/06/2023 10/03/23   Okey Burns, MD   MR TIBIA FIBULA LEFT WO CONTRAST Result Date: 10/07/2023 MR TIBIA AND FIBULA  WITHOUT IV CONTRAST COMPARISON: None. CLINICAL HISTORY: Soft tissue infection suspected. PULSE SEQUENCES: Ax T1, Ax T2 FS, Sag T1, Sag T2 FS, Cor STIR, Cor T1 without contrast. FINDINGS: Bones: Mild degenerative changes are seen left knee with tricompartmental osteoarthrosis and reactive joint effusion. There is a small Baker's cyst. Otherwise, the bones are unremarkable. There is no marrow edema or evidence of osteomyelitis. Musculotendinous structures: Musculotendinous structures demonstrate no significant myositis or fatty atrophy. No deep collection is present. There is moderate pretibial and lateral subcutaneous edema. There is suggestion of mild more focal collections along the anterior fascia in the mid pretibial region best seen on axial image 29 through 40 of series 14. It is difficult to determine if this could represent a more focal abscess or simply a phlegmonous process. Other correlation with MRI of the left tib-fib with IV contrast or focused ultrasound of the mid pretibial soft tissues to determine if there is a collection that can be sampled. IMPRESSION: Moderate focal pretibial and lateral subcutaneous edema in the mid left lower extremity. There is a more focal collection along the anterior compartment fascia which is indeterminate. This could reflect an abscess in the appropriate clinical setting. See above for more detail. Focused ultrasound with sampling if clinically indicated. No deep involvement or osseous involvement at the current time. Electronically signed by: Norleen Satchel MD 10/07/2023 11:14 AM EDT RP Workstation: [id number]   DG Chest 2 View Result Date: 10/06/2023 CLINICAL DATA:  Hypoxia EXAM: CHEST - 2 VIEW COMPARISON:  Chest x-ray 02/11/2023 FINDINGS: The heart size and mediastinal contours are within normal limits. Both lungs are clear. The visualized skeletal structures are unremarkable. IMPRESSION: No active cardiopulmonary disease. Electronically Signed   By: Greig Pique  M.D.   On: 10/06/2023 22:44   DG Tibia/Fibula Left Result Date: 10/06/2023 CLINICAL DATA:  Lower extremity pain.  Potential for osteomyelitis. EXAM: LEFT TIBIA AND FIBULA - 2 VIEW COMPARISON:  Study of 08/10/2023. FINDINGS: AP Lat two-view exam obtained in 4 films, again reveals generalized edema in the mid and distal foreleg beginning in the mid calf continuing inferiorly and greater laterally. In the anterolateral mid foreleg there is a focally more prominent edema with 2 cm subcutaneous air pocket or soft tissue defect not seen previously. There is no radiopaque foreign body. No evidence of fractures or acute osteomyelitis. There is moderate arthrosis at the left knee and left ankle. Enthesopathic changes of the anterior patella. There is a popcorn like calcification again noted in the popliteal fossa which would either represent a calcified popliteal lymph node or osteochondral loose body such as within the posterior joint space or in a popliteal cyst. There are patchy calcifications in the posterior tibial artery. IMPRESSION: 1. Generalized edema in the mid and distal foreleg, greater laterally. 2. Focally more prominent edema in the anterolateral mid foreleg with 2 cm subcutaneous air pocket or soft tissue defect not seen previously. 3. No evidence of fractures or acute osteomyelitis. 4. Moderate arthrosis at the left knee and left ankle. 5. Calcified popliteal lymph node versus osteochondral loose body in the  posterior joint space or in a popliteal cyst. Electronically Signed   By: Francis Quam M.D.   On: 10/06/2023 21:02   - pertinent xrays, CT, MRI studies were reviewed and independently interpreted  Positive ROS: All other systems have been reviewed and were otherwise negative with the exception of those mentioned in the HPI and as above.  Physical Exam: General: Alert, no acute distress Psychiatric: Patient is competent for consent with normal mood and affect Lymphatic: No axillary or  cervical lymphadenopathy Cardiovascular: No pedal edema Respiratory: No cyanosis, no use of accessory musculature GI: No organomegaly, abdomen is soft and non-tender    Images:  @ENCIMAGES @  Labs:  Lab Results  Component Value Date   HGBA1C 7.2 (H) 06/05/2023   HGBA1C 7.0 (H) 12/05/2022   HGBA1C 7.1 (H) 05/15/2022   ESRSEDRATE 16 08/01/2020   ESRSEDRATE 76 (H) 12/07/2019   CRP 0.5 09/30/2020   CRP 2.0 (H) 09/27/2020   CRP 3.1 (H) 09/26/2020   LABURIC 4.3 05/15/2022   LABURIC 3.4 05/09/2016   REPTSTATUS PENDING 10/06/2023   CULT  10/06/2023    NO GROWTH < 12 HOURS Performed at [facility], [address], [place], [name] [address]     Lab Results  Component Value Date   ALBUMIN 3.0 (L) 10/07/2023   ALBUMIN 3.5 10/06/2023   ALBUMIN 4.3 06/05/2023   LABURIC 4.3 05/15/2022   LABURIC 3.4 05/09/2016        Latest Ref Rng & Units 10/07/2023    4:05 AM 10/06/2023    7:08 PM 06/05/2023    3:20 PM  CBC EXTENDED  WBC 4.0 - 10.5 K/uL 20.9  20.6  9.8   RBC 3.87 - 5.11 MIL/uL 4.57  5.11  4.62   Hemoglobin 12.0 - 15.0 g/dL 85.6  84.0  85.0   HCT 36.0 - 46.0 % 44.4  49.6  46.3   Platelets 150 - 400 K/uL 232  287  267.0   NEUT# 1.7 - 7.7 K/uL 17.0  16.6  6.7   Lymph# 0.7 - 4.0 K/uL 1.7  1.7  2.1     Neurologic: Patient does not have protective sensation bilateral lower extremities.   MUSCULOSKELETAL:   Skin: Examination there is a large area of cellulitis over the anterior lateral aspect of the left calf.  There is an ulcer patient states she has had clear serosanguineous drainage.  Patient has a palpable dorsalis pedis pulse.  Review of the MRI scan shows a large fluid collection beneath the subcutaneous tissue.  With the overlying cellulitis and ulcer this seems most consistent with an infected hematoma.  White cell count 20.9.  Hemoglobin A1c 7.2.  Assessment: Assessment: Infected hematoma left calf with cellulitis and elevated white cell  count.  Plan: Plan: Will plan for surgical debridement of the the infected hematoma.  Risk and benefits were discussed including risk of the wound not healing.  Will plan for surgery on Wednesday.  Thank you for the consult and the opportunity to see Ms. Eveline Jerona Sage, MD [facility] [phone] 6:01 PM

## 2023-10-07 NOTE — ED Notes (Signed)
 Nt called CCMD@7 :19am

## 2023-10-07 NOTE — Progress Notes (Signed)
 Patient to [phone] at this time

## 2023-10-08 DIAGNOSIS — L03116 Cellulitis of left lower limb: Secondary | ICD-10-CM | POA: Diagnosis not present

## 2023-10-08 LAB — URINALYSIS, ROUTINE W REFLEX MICROSCOPIC
Bilirubin Urine: NEGATIVE
Glucose, UA: >=500 mg/dL — AB
Hgb urine dipstick: NEGATIVE
Ketones, ur: NEGATIVE mg/dL
Leukocytes,Ua: NEGATIVE
Nitrite: NEGATIVE
Protein, ur: NEGATIVE mg/dL
Specific Gravity, Urine: 1.006 (ref 1.005–1.030)
pH: 6 (ref 5.0–8.0)

## 2023-10-08 LAB — COMPREHENSIVE METABOLIC PANEL WITH GFR
ALT: 11 U/L (ref 0–44)
AST: 12 U/L — ABNORMAL LOW (ref 15–41)
Albumin: 2.9 g/dL — ABNORMAL LOW (ref 3.5–5.0)
Alkaline Phosphatase: 89 U/L (ref 38–126)
Anion gap: 10 (ref 5–15)
BUN: 20 mg/dL (ref 8–23)
CO2: 24 mmol/L (ref 22–32)
Calcium: 8.7 mg/dL — ABNORMAL LOW (ref 8.9–10.3)
Chloride: 101 mmol/L (ref 98–111)
Creatinine, Ser: 2.15 mg/dL — ABNORMAL HIGH (ref 0.44–1.00)
GFR, Estimated: 23 mL/min — ABNORMAL LOW (ref >60)
Glucose, Bld: 107 mg/dL — ABNORMAL HIGH (ref 70–99)
Potassium: 4.4 mmol/L (ref 3.5–5.1)
Sodium: 135 mmol/L (ref 135–145)
Total Bilirubin: 0.4 mg/dL (ref 0.0–1.2)
Total Protein: 6.1 g/dL — ABNORMAL LOW (ref 6.5–8.1)

## 2023-10-08 LAB — CBC
HCT: 42.9 % (ref 36.0–46.0)
Hemoglobin: 13.9 g/dL (ref 12.0–15.0)
MCH: 31.2 pg (ref 26.0–34.0)
MCHC: 32.4 g/dL (ref 30.0–36.0)
MCV: 96.4 fL (ref 80.0–100.0)
Platelets: 230 K/uL (ref 150–400)
RBC: 4.45 MIL/uL (ref 3.87–5.11)
RDW: 14.3 % (ref 11.5–15.5)
WBC: 14.7 K/uL — ABNORMAL HIGH (ref 4.0–10.5)
nRBC: 0 % (ref 0.0–0.2)

## 2023-10-08 LAB — MAGNESIUM: Magnesium: 1.8 mg/dL (ref 1.7–2.4)

## 2023-10-08 LAB — GLUCOSE, CAPILLARY
Glucose-Capillary: 154 mg/dL — ABNORMAL HIGH (ref 70–99)
Glucose-Capillary: 97 mg/dL (ref 70–99)
Glucose-Capillary: 244 mg/dL — ABNORMAL HIGH (ref 70–99)
Glucose-Capillary: 314 mg/dL — ABNORMAL HIGH (ref 70–99)

## 2023-10-08 MED ORDER — DULOXETINE HCL 60 MG PO CPEP
60.0000 mg | ORAL_CAPSULE | Freq: Every day | ORAL | Status: DC
  Administered 2023-10-08 – 2023-10-13 (×8): 60 mg via ORAL
  Filled 2023-10-08 (×6): qty 1

## 2023-10-08 MED ORDER — IPRATROPIUM-ALBUTEROL 0.5-2.5 (3) MG/3ML IN SOLN
3.0000 mL | Freq: Four times a day (QID) | RESPIRATORY_TRACT | Status: DC
  Administered 2023-10-08 – 2023-10-09 (×8): 3 mL via RESPIRATORY_TRACT
  Filled 2023-10-08 (×4): qty 3

## 2023-10-08 MED ORDER — TRAMADOL HCL 50 MG PO TABS
50.0000 mg | ORAL_TABLET | Freq: Three times a day (TID) | ORAL | Status: DC | PRN
  Administered 2023-10-08 – 2023-10-09 (×4): 50 mg via ORAL
  Filled 2023-10-08 (×2): qty 1

## 2023-10-08 MED ORDER — CYCLOBENZAPRINE HCL 5 MG PO TABS
5.0000 mg | ORAL_TABLET | Freq: Three times a day (TID) | ORAL | Status: DC | PRN
  Administered 2023-10-08 (×4): 5 mg via ORAL
  Filled 2023-10-08 (×2): qty 1

## 2023-10-08 MED ORDER — LORATADINE 10 MG PO TABS
10.0000 mg | ORAL_TABLET | Freq: Every day | ORAL | Status: DC
  Administered 2023-10-08 – 2023-10-13 (×8): 10 mg via ORAL
  Filled 2023-10-08 (×6): qty 1

## 2023-10-08 MED ORDER — METOPROLOL TARTRATE 25 MG PO TABS
25.0000 mg | ORAL_TABLET | Freq: Two times a day (BID) | ORAL | Status: DC
  Administered 2023-10-08 – 2023-10-13 (×15): 25 mg via ORAL
  Filled 2023-10-08 (×11): qty 1

## 2023-10-08 MED ORDER — PREDNISONE 5 MG PO TABS
50.0000 mg | ORAL_TABLET | Freq: Every day | ORAL | Status: DC
  Administered 2023-10-08 (×2): 50 mg via ORAL
  Filled 2023-10-08: qty 2

## 2023-10-08 MED ORDER — MONTELUKAST SODIUM 10 MG PO TABS
10.0000 mg | ORAL_TABLET | Freq: Every day | ORAL | Status: DC
  Administered 2023-10-08 – 2023-10-12 (×7): 10 mg via ORAL
  Filled 2023-10-08 (×5): qty 1

## 2023-10-08 MED ORDER — FERROUS SULFATE 325 (65 FE) MG PO TABS
325.0000 mg | ORAL_TABLET | ORAL | Status: DC
  Administered 2023-10-08 – 2023-10-12 (×3): 325 mg via ORAL
  Filled 2023-10-08 (×4): qty 1

## 2023-10-08 MED ORDER — PANTOPRAZOLE SODIUM 40 MG PO TBEC
40.0000 mg | DELAYED_RELEASE_TABLET | Freq: Every day | ORAL | Status: DC
  Administered 2023-10-08 – 2023-10-13 (×8): 40 mg via ORAL
  Filled 2023-10-08 (×6): qty 1

## 2023-10-08 NOTE — Progress Notes (Signed)
 PROGRESS NOTE    MAYAH Powell  FMW:[id number] DOB: 1946/01/26 DOA: 10/06/2023 PCP: Geofm Glade PARAS, MD     Brief Narrative:  Kerry Powell is a 78 y.o. 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   The patient conveys that she struck the anterior aspect of her left lower extremity distal to the left knee approximately 3 weeks ago, following which she developed a scab.  Approximately 5 days ago the associated scab fell off.  Subsequently, she has noted progressive left lower extremity pain, erythema, swelling, as well as development of purulent drainage.  This has been associated with to 3 days of subjective fever and chills in the absence of full by rigors or generalized myalgias.    Patient was admitted for severe sepsis secondary to cellulitis and abscess of infected hematoma. She was started on empiric IV antibiotics and orthopedic surgery consulted for I&D.   New events last 24 hours / Subjective: Continues to have pain in LLE   Assessment & Plan:   Principal Problem:   Cellulitis of left lower extremity Active Problems:   DM2 (diabetes mellitus, type 2) (HCC)   Acute hypoxic respiratory failure (HCC)   COPD with acute exacerbation (HCC)   CKD stage 3b, GFR 30-44 ml/min (HCC)   Chronic diastolic CHF (congestive heart failure) (HCC)   Severe sepsis (HCC)   Acute hyponatremia   Sterile pyuria   Abscess of left lower leg   Severe sepsis secondary to cellulitis and abscess of left lower extremity, infected hematoma - Sepsis present on admission - Appreciate orthopedic surgery, I&D scheduled for 8/13 - Blood cultures NGTD  - Continue vancomycin , Rocephin   Acute hypoxemic respiratory  failure secondary to COPD exacerbation - Prednisone , breathing treatments, Rocephin  as above  Acute urinary retention - Bladder scan revealed >510ml.  In-N-Out catheter  Diabetes mellitus - A1c 6.4 - On farxiga  PTA - Sliding scale insulin   Chronic diastolic heart failure - Takes Lasix  prn PTA, I's and O's, lopressor    Hypertension - Norvasc  and ARB are on hold due to marginal blood pressure in setting of sepsis  CKD stage IIIb - Baseline creatinine 1.8-2 - Stable  Obesity - On Mounjaro  PTA  PAD - Plavix , crestor      DVT prophylaxis:  heparin  injection 5,000 Units Start: 10/06/23 2230  Code Status: DNR Family Communication: None at bedside Disposition Plan: Home Status is: Inpatient Remains inpatient appropriate because: OR 8/13     Antimicrobials:  Anti-infectives (From admission, onward)    Start     Dose/Rate Route Frequency Ordered Stop   10/08/23 2200  vancomycin  (VANCOCIN ) 1,000 mg in sodium chloride  0.9 % 250 mL IVPB        1,000 mg 250 mL/hr over 60 Minutes Intravenous Every 48 hours 10/07/23 0208     10/07/23 1500  cefTRIAXone  (ROCEPHIN ) 1 g in sodium chloride  0.9 % 100 mL IVPB        1 g 200 mL/hr over 30 Minutes Intravenous Every 24 hours 10/06/23 2229     10/06/23 2015  cefTRIAXone  (ROCEPHIN ) 2 g in sodium chloride  0.9 % 100 mL IVPB        2 g 200 mL/hr over 30 Minutes Intravenous Once 10/06/23 2004 10/06/23 2120  10/06/23 2015  vancomycin  (VANCOCIN ) IVPB 1000 mg/200 mL premix        1,000 mg 200 mL/hr over 60 Minutes Intravenous  Once 10/06/23 2004 10/06/23 2244        Objective: Vitals:   10/07/23 2329 10/08/23 0410 10/08/23 0450 10/08/23 0802  BP: (!) 105/58 90/60  112/65  Pulse: 90 90  89  Resp: 20 16  16   Temp: 97.8 F (36.6 C) 97.9 F (36.6 C)  98.1 F (36.7 C)  TempSrc: Oral Oral  Oral  SpO2: 91% 97%  (!) 89%  Weight:   92 kg   Height:        Intake/Output Summary (Last 24 hours) at 10/08/2023 1058 Last data filed at  10/08/2023 0423 Gross per 24 hour  Intake --  Output 400 ml  Net -400 ml   Filed Weights   10/06/23 1904 10/08/23 0450  Weight: 102.6 kg 92 kg    Examination:  General exam: Appears calm and comfortable  Respiratory system: Clear to auscultation. Respiratory effort normal. No respiratory distress. No conversational dyspnea.  Cardiovascular system: S1 & S2 heard, RRR. No murmurs. No pedal edema. Gastrointestinal system: Abdomen is nondistended, soft and nontender. Normal bowel sounds heard. Central nervous system: Alert and oriented. No focal neurological deficits. Speech clear.  Extremities: Symmetric in appearance  Skin: LLE with erythema and abscess (covered) Psychiatry: Judgement and insight appear normal. Mood & affect appropriate.   Data Reviewed: I have personally reviewed following labs and imaging studies  CBC: Recent Labs  Lab 10/06/23 1908 10/07/23 0405 10/08/23 0529  WBC 20.6* 20.9* 14.7*  NEUTROABS 16.6* 17.0*  --   HGB 15.9* 14.3 13.9  HCT 49.6* 44.4 42.9  MCV 97.1 97.2 96.4  PLT 287 232 230   Basic Metabolic Panel: Recent Labs  Lab 10/06/23 1908 10/06/23 1922 10/07/23 0405 10/08/23 0529  NA 130*  --  132* 135  K 4.3  --  4.3 4.4  CL 99  --  99 101  CO2 19*  --  23 24  GLUCOSE 140*  --  120* 107*  BUN 21  --  21 20  CREATININE 1.93*  --  1.97* 2.15*  CALCIUM  9.1  --  8.7* 8.7*  MG  --  1.7 1.8 1.8  PHOS  --   --  2.7  --    GFR: Estimated Creatinine Clearance: 24.2 mL/min (A) (by C-G formula based on SCr of 2.15 mg/dL (H)). Liver Function Tests: Recent Labs  Lab 10/06/23 1908 10/07/23 0405 10/08/23 0529  AST 16 22 12*  ALT 12 14 11   ALKPHOS 93 87 89  BILITOT 0.9 1.0 0.4  PROT 7.1 6.0* 6.1*  ALBUMIN 3.5 3.0* 2.9*   No results for input(s): LIPASE, AMYLASE in the last 168 hours. No results for input(s): AMMONIA in the last 168 hours. Coagulation Profile: Recent Labs  Lab 10/06/23 1908  INR 1.0   Cardiac Enzymes: No results  for input(s): CKTOTAL, CKMB, CKMBINDEX, TROPONINI in the last 168 hours. BNP (last 3 results) No results for input(s): PROBNP in the last 8760 hours. HbA1C: Recent Labs    10/07/23 0405  HGBA1C 6.4*   CBG: Recent Labs  Lab 10/06/23 2350 10/07/23 1150 10/07/23 1601 10/07/23 2113 10/08/23 0801  GLUCAP 110* 123* 127* 147* 154*   Lipid Profile: No results for input(s): CHOL, HDL, LDLCALC, TRIG, CHOLHDL, LDLDIRECT in the last 72 hours. Thyroid  Function Tests: Recent Labs    10/07/23 0405  TSH 1.706  Anemia Panel: No results for input(s): VITAMINB12, FOLATE, FERRITIN, TIBC, IRON, RETICCTPCT in the last 72 hours. Sepsis Labs: Recent Labs  Lab 10/06/23 1922 10/06/23 1939  PROCALCITON 0.13  --   LATICACIDVEN  --  1.5    Recent Results (from the past 240 hours)  Culture, blood (Routine x 2)     Status: None (Preliminary result)   Collection Time: 10/06/23  7:08 PM   Specimen: BLOOD  Result Value Ref Range Status   Specimen Description BLOOD RIGHT ANTECUBITAL  Final   Special Requests AEROBIC BOTTLE ONLY Blood Culture adequate volume  Final   Culture   Final    NO GROWTH 2 DAYS Performed at [facility], [address], [place], [name] [address]    Report Status PENDING  Incomplete  Culture, blood (Routine x 2)     Status: None (Preliminary result)   Collection Time: 10/06/23  7:32 PM   Specimen: BLOOD  Result Value Ref Range Status   Specimen Description BLOOD BLOOD LEFT ARM  Final   Special Requests   Final    BOTTLES DRAWN AEROBIC AND ANAEROBIC Blood Culture results may not be optimal due to an inadequate volume of blood received in culture bottles   Culture   Final    NO GROWTH 2 DAYS Performed at [facility], [address] [place], [name] [address]    Report Status PENDING  Incomplete      Radiology Studies: MR TIBIA FIBULA LEFT WO CONTRAST Result Date: 10/07/2023 MR TIBIA AND FIBULA WITHOUT IV CONTRAST  COMPARISON: None. CLINICAL HISTORY: Soft tissue infection suspected. PULSE SEQUENCES: Ax T1, Ax T2 FS, Sag T1, Sag T2 FS, Cor STIR, Cor T1 without contrast. FINDINGS: Bones: Mild degenerative changes are seen left knee with tricompartmental osteoarthrosis and reactive joint effusion. There is a small Baker's cyst. Otherwise, the bones are unremarkable. There is no marrow edema or evidence of osteomyelitis. Musculotendinous structures: Musculotendinous structures demonstrate no significant myositis or fatty atrophy. No deep collection is present. There is moderate pretibial and lateral subcutaneous edema. There is suggestion of mild more focal collections along the anterior fascia in the mid pretibial region best seen on axial image 29 through 40 of series 14. It is difficult to determine if this could represent a more focal abscess or simply a phlegmonous process. Other correlation with MRI of the left tib-fib with IV contrast or focused ultrasound of the mid pretibial soft tissues to determine if there is a collection that can be sampled. IMPRESSION: Moderate focal pretibial and lateral subcutaneous edema in the mid left lower extremity. There is a more focal collection along the anterior compartment fascia which is indeterminate. This could reflect an abscess in the appropriate clinical setting. See above for more detail. Focused ultrasound with sampling if clinically indicated. No deep involvement or osseous involvement at the current time. Electronically signed by: Norleen Satchel MD 10/07/2023 11:14 AM EDT RP Workstation: [id number]   DG Chest 2 View Result Date: 10/06/2023 CLINICAL DATA:  Hypoxia EXAM: CHEST - 2 VIEW COMPARISON:  Chest x-ray 02/11/2023 FINDINGS: The heart size and mediastinal contours are within normal limits. Both lungs are clear. The visualized skeletal structures are unremarkable. IMPRESSION: No active cardiopulmonary disease. Electronically Signed   By: Greig Pique M.D.   On: 10/06/2023  22:44   DG Tibia/Fibula Left Result Date: 10/06/2023 CLINICAL DATA:  Lower extremity pain.  Potential for osteomyelitis. EXAM: LEFT TIBIA AND FIBULA - 2 VIEW COMPARISON:  Study of  08/10/2023. FINDINGS: AP Lat two-view exam obtained in 4 films, again reveals generalized edema in the mid and distal foreleg beginning in the mid calf continuing inferiorly and greater laterally. In the anterolateral mid foreleg there is a focally more prominent edema with 2 cm subcutaneous air pocket or soft tissue defect not seen previously. There is no radiopaque foreign body. No evidence of fractures or acute osteomyelitis. There is moderate arthrosis at the left knee and left ankle. Enthesopathic changes of the anterior patella. There is a popcorn like calcification again noted in the popliteal fossa which would either represent a calcified popliteal lymph node or osteochondral loose body such as within the posterior joint space or in a popliteal cyst. There are patchy calcifications in the posterior tibial artery. IMPRESSION: 1. Generalized edema in the mid and distal foreleg, greater laterally. 2. Focally more prominent edema in the anterolateral mid foreleg with 2 cm subcutaneous air pocket or soft tissue defect not seen previously. 3. No evidence of fractures or acute osteomyelitis. 4. Moderate arthrosis at the left knee and left ankle. 5. Calcified popliteal lymph node versus osteochondral loose body in the posterior joint space or in a popliteal cyst. Electronically Signed   By: Francis Quam M.D.   On: 10/06/2023 21:02      Scheduled Meds:  clopidogrel   75 mg Oral Daily   DULoxetine   60 mg Oral Daily   fluticasone   1 spray Each Nare Daily   heparin   5,000 Units Subcutaneous Q8H   insulin  aspart  0-5 Units Subcutaneous QHS   insulin  aspart  0-9 Units Subcutaneous TID WC   montelukast   10 mg Oral QHS   pantoprazole  (PROTONIX ) IV  40 mg Intravenous Q24H   rosuvastatin   40 mg Oral Daily   Continuous  Infusions:  cefTRIAXone  (ROCEPHIN )  IV Stopped (10/07/23 1935)   vancomycin  (VANCOCIN ) 1,000 mg in sodium chloride  0.9 % 250 mL IVPB       LOS: 2 days   Time spent: 35 minutes   Delon Hoe, DO [facility] 10/08/2023, 10:58 AM   Available via Epic secure chat 7am-7pm After these hours, please refer to coverage provider listed on [email or web address]

## 2023-10-08 NOTE — Plan of Care (Signed)
  Problem: Education: Goal: Ability to describe self-care measures that may prevent or decrease complications (Diabetes Survival Skills Education) will improve Outcome: Not Progressing Goal: Individualized Educational Video(s) Outcome: Not Progressing   Problem: Coping: Goal: Ability to adjust to condition or change in health will improve Outcome: Not Progressing   Problem: Fluid Volume: Goal: Ability to maintain a balanced intake and output will improve Outcome: Not Progressing   Problem: Health Behavior/Discharge Planning: Goal: Ability to identify and utilize available resources and services will improve Outcome: Not Progressing Goal: Ability to manage health-related needs will improve Outcome: Not Progressing   Problem: Metabolic: Goal: Ability to maintain appropriate glucose levels will improve Outcome: Not Progressing   Problem: Nutritional: Goal: Maintenance of adequate nutrition will improve Outcome: Not Progressing Goal: Progress toward achieving an optimal weight will improve Outcome: Not Progressing   Problem: Skin Integrity: Goal: Risk for impaired skin integrity will decrease Outcome: Not Progressing   Problem: Tissue Perfusion: Goal: Adequacy of tissue perfusion will improve Outcome: Not Progressing   Problem: Education: Goal: Knowledge of General Education information will improve Description: Including pain rating scale, medication(s)/side effects and non-pharmacologic comfort measures Outcome: Not Progressing   Problem: Health Behavior/Discharge Planning: Goal: Ability to manage health-related needs will improve Outcome: Not Progressing   Problem: Clinical Measurements: Goal: Ability to maintain clinical measurements within normal limits will improve Outcome: Not Progressing Goal: Will remain free from infection Outcome: Not Progressing Goal: Diagnostic test results will improve Outcome: Not Progressing Goal: Respiratory complications will  improve Outcome: Not Progressing Goal: Cardiovascular complication will be avoided Outcome: Not Progressing   Problem: Activity: Goal: Risk for activity intolerance will decrease Outcome: Not Progressing

## 2023-10-08 NOTE — Plan of Care (Signed)
  Problem: Coping: Goal: Ability to adjust to condition or change in health will improve Outcome: Progressing   Problem: Skin Integrity: Goal: Risk for impaired skin integrity will decrease Outcome: Progressing   Problem: Health Behavior/Discharge Planning: Goal: Ability to manage health-related needs will improve Outcome: Progressing   Problem: Clinical Measurements: Goal: Will remain free from infection Outcome: Progressing Goal: Respiratory complications will improve Outcome: Progressing   Problem: Pain Managment: Goal: General experience of comfort will improve and/or be controlled Outcome: Progressing   Problem: Skin Integrity: Goal: Risk for impaired skin integrity will decrease Outcome: Progressing

## 2023-10-09 ENCOUNTER — Other Ambulatory Visit: Payer: Self-pay

## 2023-10-09 ENCOUNTER — Inpatient Hospital Stay (HOSPITAL_COMMUNITY): Admitting: Anesthesiology

## 2023-10-09 ENCOUNTER — Encounter (HOSPITAL_COMMUNITY): Payer: Self-pay | Admitting: Internal Medicine

## 2023-10-09 ENCOUNTER — Encounter (HOSPITAL_COMMUNITY)
Admission: EM | Disposition: A | Discharge status: Home Health | Payer: Self-pay | Source: Home / Self Care | Attending: Internal Medicine

## 2023-10-09 DIAGNOSIS — I2510 Atherosclerotic heart disease of native coronary artery without angina pectoris: Secondary | ICD-10-CM

## 2023-10-09 DIAGNOSIS — I110 Hypertensive heart disease with heart failure: Secondary | ICD-10-CM | POA: Diagnosis not present

## 2023-10-09 DIAGNOSIS — I5032 Chronic diastolic (congestive) heart failure: Secondary | ICD-10-CM

## 2023-10-09 DIAGNOSIS — L03116 Cellulitis of left lower limb: Secondary | ICD-10-CM | POA: Diagnosis not present

## 2023-10-09 DIAGNOSIS — N1832 Chronic kidney disease, stage 3b: Secondary | ICD-10-CM | POA: Diagnosis not present

## 2023-10-09 DIAGNOSIS — M65062 Abscess of tendon sheath, left lower leg: Secondary | ICD-10-CM | POA: Diagnosis not present

## 2023-10-09 DIAGNOSIS — J9601 Acute respiratory failure with hypoxia: Secondary | ICD-10-CM | POA: Diagnosis not present

## 2023-10-09 DIAGNOSIS — L02416 Cutaneous abscess of left lower limb: Secondary | ICD-10-CM | POA: Diagnosis not present

## 2023-10-09 HISTORY — PX: APPLICATION OF WOUND VAC: SHX5189

## 2023-10-09 HISTORY — PX: INCISION AND DRAINAGE OF DEEP ABSCESS, CALF: SHX7361

## 2023-10-09 LAB — CBC
HCT: 43.1 % (ref 36.0–46.0)
Hemoglobin: 13.9 g/dL (ref 12.0–15.0)
MCH: 31.2 pg (ref 26.0–34.0)
MCHC: 32.3 g/dL (ref 30.0–36.0)
MCV: 96.6 fL (ref 80.0–100.0)
Platelets: 237 K/uL (ref 150–400)
RBC: 4.46 MIL/uL (ref 3.87–5.11)
RDW: 14 % (ref 11.5–15.5)
WBC: 11.1 K/uL — ABNORMAL HIGH (ref 4.0–10.5)
nRBC: 0 % (ref 0.0–0.2)

## 2023-10-09 LAB — SURGICAL PCR SCREEN
MRSA, PCR: NEGATIVE
Staphylococcus aureus: NEGATIVE

## 2023-10-09 LAB — BASIC METABOLIC PANEL WITH GFR
Anion gap: 11 (ref 5–15)
BUN: 21 mg/dL (ref 8–23)
CO2: 21 mmol/L — ABNORMAL LOW (ref 22–32)
Calcium: 8.9 mg/dL (ref 8.9–10.3)
Chloride: 105 mmol/L (ref 98–111)
Creatinine, Ser: 1.92 mg/dL — ABNORMAL HIGH (ref 0.44–1.00)
GFR, Estimated: 26 mL/min — ABNORMAL LOW (ref >60)
Glucose, Bld: 182 mg/dL — ABNORMAL HIGH (ref 70–99)
Potassium: 4.6 mmol/L (ref 3.5–5.1)
Sodium: 137 mmol/L (ref 135–145)

## 2023-10-09 LAB — GLUCOSE, CAPILLARY
Glucose-Capillary: 147 mg/dL — ABNORMAL HIGH (ref 70–99)
Glucose-Capillary: 121 mg/dL — ABNORMAL HIGH (ref 70–99)
Glucose-Capillary: 125 mg/dL — ABNORMAL HIGH (ref 70–99)
Glucose-Capillary: 259 mg/dL — ABNORMAL HIGH (ref 70–99)

## 2023-10-09 SURGERY — INCISION AND DRAINAGE OF DEEP ABSCESS, CALF
Anesthesia: General | Laterality: Left

## 2023-10-09 MED ORDER — FENTANYL CITRATE (PF) 100 MCG/2ML IJ SOLN
INTRAMUSCULAR | Status: AC
  Filled 2023-10-09: qty 2

## 2023-10-09 MED ORDER — OXYCODONE-ACETAMINOPHEN 5-325 MG PO TABS
1.0000 | ORAL_TABLET | ORAL | Status: DC | PRN
  Filled 2023-10-09: qty 2

## 2023-10-09 MED ORDER — ORAL CARE MOUTH RINSE: 15.0000 mL | Freq: Once | OROMUCOSAL | Status: DC

## 2023-10-09 MED ORDER — LIDOCAINE 2% (20 MG/ML) 5 ML SYRINGE
INTRAMUSCULAR | Status: AC
Start: 2023-10-09 — End: 2023-10-09
  Filled 2023-10-09: qty 5

## 2023-10-09 MED ORDER — PROPOFOL 10 MG/ML IV BOLUS
INTRAVENOUS | Status: DC | PRN
Start: 2023-10-09 — End: 2023-10-09
  Administered 2023-10-09 (×2): 50 mg via INTRAVENOUS
  Administered 2023-10-09 (×2): 100 mg via INTRAVENOUS
  Administered 2023-10-09 (×2): 50 mg via INTRAVENOUS

## 2023-10-09 MED ORDER — DEXAMETHASONE SODIUM PHOSPHATE 10 MG/ML IJ SOLN
INTRAMUSCULAR | Status: DC | PRN
  Administered 2023-10-09 (×2): 8 mg via INTRAVENOUS

## 2023-10-09 MED ORDER — FENTANYL CITRATE (PF) 100 MCG/2ML IJ SOLN
25.0000 ug | INTRAMUSCULAR | Status: DC | PRN
  Administered 2023-10-09 (×6): 50 ug via INTRAVENOUS

## 2023-10-09 MED ORDER — CHLORHEXIDINE GLUCONATE 0.12 % MT SOLN
OROMUCOSAL | Status: AC
  Filled 2023-10-09: qty 15

## 2023-10-09 MED ORDER — CHLORHEXIDINE GLUCONATE 0.12 % MT SOLN: 15.0000 mL | Freq: Once | OROMUCOSAL | Status: DC

## 2023-10-09 MED ORDER — ONDANSETRON HCL 4 MG/2ML IJ SOLN: 4.0000 mg | Freq: Once | INTRAMUSCULAR | Status: DC | PRN

## 2023-10-09 MED ORDER — FENTANYL CITRATE (PF) 250 MCG/5ML IJ SOLN
INTRAMUSCULAR | Status: AC
  Filled 2023-10-09: qty 5

## 2023-10-09 MED ORDER — LIDOCAINE 2% (20 MG/ML) 5 ML SYRINGE
INTRAMUSCULAR | Status: DC | PRN
Start: 2023-10-09 — End: 2023-10-09
  Administered 2023-10-09 (×2): 100 mg via INTRAVENOUS

## 2023-10-09 MED ORDER — LACTATED RINGERS IV SOLN: INTRAVENOUS | Status: DC | PRN

## 2023-10-09 MED ORDER — ACETAMINOPHEN 10 MG/ML IV SOLN
1000.0000 mg | Freq: Once | INTRAVENOUS | Status: DC | PRN
  Administered 2023-10-09 (×2): 1000 mg via INTRAVENOUS

## 2023-10-09 MED ORDER — FENTANYL CITRATE (PF) 250 MCG/5ML IJ SOLN
INTRAMUSCULAR | Status: DC | PRN
  Administered 2023-10-09 (×2): 50 ug via INTRAVENOUS

## 2023-10-09 MED ORDER — CEFAZOLIN SODIUM-DEXTROSE 2-4 GM/100ML-% IV SOLN
2.0000 g | INTRAVENOUS | Status: DC
  Filled 2023-10-09: qty 100

## 2023-10-09 MED ORDER — PROPOFOL 10 MG/ML IV BOLUS
INTRAVENOUS | Status: AC
  Filled 2023-10-09: qty 20

## 2023-10-09 MED ORDER — OXYCODONE HCL 5 MG PO TABS
5.0000 mg | ORAL_TABLET | Freq: Four times a day (QID) | ORAL | Status: DC | PRN
  Administered 2023-10-09 – 2023-10-10 (×3): 5 mg via ORAL
  Filled 2023-10-09 (×3): qty 1

## 2023-10-09 MED ORDER — PREDNISONE 20 MG PO TABS
40.0000 mg | ORAL_TABLET | Freq: Every day | ORAL | Status: DC
  Administered 2023-10-10: 40 mg via ORAL
  Filled 2023-10-09: qty 2

## 2023-10-09 MED ORDER — LACTATED RINGERS IV SOLN: INTRAVENOUS | Status: DC

## 2023-10-09 MED ORDER — PHENYLEPHRINE 80 MCG/ML (10ML) SYRINGE FOR IV PUSH (FOR BLOOD PRESSURE SUPPORT)
PREFILLED_SYRINGE | INTRAVENOUS | Status: DC | PRN
  Administered 2023-10-09 (×2): 80 ug via INTRAVENOUS

## 2023-10-09 MED ORDER — VASHE WOUND IRRIGATION OPTIME
TOPICAL | Status: DC | PRN
Start: 2023-10-09 — End: 2023-10-09
  Administered 2023-10-09 (×2): 34 [oz_av]

## 2023-10-09 MED ORDER — ONDANSETRON HCL 4 MG/2ML IJ SOLN
INTRAMUSCULAR | Status: DC | PRN
Start: 2023-10-09 — End: 2023-10-09
  Administered 2023-10-09 (×2): 4 mg via INTRAVENOUS

## 2023-10-09 MED ORDER — CHLORHEXIDINE GLUCONATE 4 % EX SOLN
60.0000 mL | Freq: Once | CUTANEOUS | Status: AC
  Administered 2023-10-09 (×2): 4 via TOPICAL
  Filled 2023-10-09: qty 60

## 2023-10-09 MED ORDER — POVIDONE-IODINE 10 % EX SWAB: 2.0000 | Freq: Once | CUTANEOUS | Status: DC

## 2023-10-09 SURGICAL SUPPLY — 50 items
BAG COUNTER SPONGE SURGICOUNT (BAG) ×2 IMPLANT
BLADE SURG 21 STRL SS (BLADE) ×2 IMPLANT
BNDG COHESIVE 4X5 TAN STRL LF (GAUZE/BANDAGES/DRESSINGS) IMPLANT
BNDG COHESIVE 6X5 TAN NS LF (GAUZE/BANDAGES/DRESSINGS) IMPLANT
BNDG COHESIVE 6X5 TAN ST LF (GAUZE/BANDAGES/DRESSINGS) IMPLANT
BNDG ELASTIC 4X5.8 VLCR STR LF (GAUZE/BANDAGES/DRESSINGS) IMPLANT
BNDG GAUZE DERMACEA FLUFF 4 (GAUZE/BANDAGES/DRESSINGS) IMPLANT
CANISTER SUCTION 3000ML PPV (SUCTIONS) ×2 IMPLANT
CANISTER WOUND CARE 500ML ATS (WOUND CARE) ×2 IMPLANT
CANISTER WOUNDNEG PRESSURE 500 (CANNISTER) IMPLANT
CLEANSER WND VASHE 34 (WOUND CARE) IMPLANT
CLEANSER WND VASHE INSTL 34OZ (WOUND CARE) IMPLANT
COVER SURGICAL LIGHT HANDLE (MISCELLANEOUS) ×4 IMPLANT
DRAPE DERMATAC (DRAPES) IMPLANT
DRAPE HALF SHEET 40X57 (DRAPES) IMPLANT
DRAPE INCISE IOBAN 66X45 STRL (DRAPES) IMPLANT
DRAPE SURG ORHT 6 SPLT 77X108 (DRAPES) IMPLANT
DRAPE U-SHAPE 47X51 STRL (DRAPES) ×2 IMPLANT
DRESSING PREVENA PLUS CUSTOM (GAUZE/BANDAGES/DRESSINGS) IMPLANT
DRESSING VERAFLO CLEANS CC MED (GAUZE/BANDAGES/DRESSINGS) IMPLANT
DRSG ADAPTIC 3X8 NADH LF (GAUZE/BANDAGES/DRESSINGS) ×2 IMPLANT
DRSG VAC GRANUFOAM LG (GAUZE/BANDAGES/DRESSINGS) IMPLANT
DRSG VAC GRANUFOAM MED (GAUZE/BANDAGES/DRESSINGS) IMPLANT
DRSG VAC GRANUFOAM SM (GAUZE/BANDAGES/DRESSINGS) IMPLANT
DRSG VAC PEEL AND PLACE LRG (GAUZE/BANDAGES/DRESSINGS) IMPLANT
DURAPREP 26ML APPLICATOR (WOUND CARE) ×2 IMPLANT
ELECTRODE REM PT RTRN 9FT ADLT (ELECTROSURGICAL) ×2 IMPLANT
GAUZE PAD ABD 8X10 STRL (GAUZE/BANDAGES/DRESSINGS) IMPLANT
GAUZE SPONGE 4X4 12PLY STRL (GAUZE/BANDAGES/DRESSINGS) IMPLANT
GAUZE XEROFORM 5X9 LF (GAUZE/BANDAGES/DRESSINGS) ×2 IMPLANT
GLOVE BIOGEL PI IND STRL 9 (GLOVE) ×2 IMPLANT
GLOVE SURG ORTHO 9.0 STRL STRW (GLOVE) ×2 IMPLANT
GOWN STRL REUS W/ TWL LRG LVL3 (GOWN DISPOSABLE) ×2 IMPLANT
GOWN STRL REUS W/ TWL XL LVL3 (GOWN DISPOSABLE) ×4 IMPLANT
KIT BASIN OR (CUSTOM PROCEDURE TRAY) ×2 IMPLANT
KIT TURNOVER KIT B (KITS) ×2 IMPLANT
MANIFOLD NEPTUNE II (INSTRUMENTS) ×2 IMPLANT
NS IRRIG 1000ML POUR BTL (IV SOLUTION) ×2 IMPLANT
PACK ORTHO EXTREMITY (CUSTOM PROCEDURE TRAY) ×2 IMPLANT
PAD ARMBOARD POSITIONER FOAM (MISCELLANEOUS) ×4 IMPLANT
PAD NEG PRESSURE SENSATRAC (MISCELLANEOUS) IMPLANT
SET HNDPC FAN SPRY TIP SCT (DISPOSABLE) IMPLANT
STOCKINETTE IMPERVIOUS 9X36 MD (GAUZE/BANDAGES/DRESSINGS) IMPLANT
SUT ETHILON 2 0 PSLX (SUTURE) ×2 IMPLANT
SWAB COLLECTION DEVICE MRSA (MISCELLANEOUS) ×2 IMPLANT
SWAB CULTURE ESWAB REG 1ML (MISCELLANEOUS) IMPLANT
TOWEL GREEN STERILE (TOWEL DISPOSABLE) ×2 IMPLANT
TOWEL GREEN STERILE FF (TOWEL DISPOSABLE) ×2 IMPLANT
TUBE CONNECTING 12X1/4 (SUCTIONS) ×2 IMPLANT
YANKAUER SUCT BULB TIP NO VENT (SUCTIONS) ×2 IMPLANT

## 2023-10-09 NOTE — Anesthesia Procedure Notes (Signed)
 Procedure Name: LMA Insertion Date/Time: 10/09/2023 2:14 PM  Performed by: Obadiah Reyes BROCKS, CRNAPre-anesthesia Checklist: Patient identified, Emergency Drugs available, Suction available and Patient being monitored Patient Re-evaluated:Patient Re-evaluated prior to induction Oxygen Delivery Method: Circle System Utilized Preoxygenation: Pre-oxygenation with 100% oxygen Induction Type: IV induction Ventilation: Mask ventilation without difficulty LMA: LMA inserted LMA Size: 4.0 Number of attempts: 1 Airway Equipment and Method: Bite block Placement Confirmation: positive ETCO2 Tube secured with: Tape Dental Injury: Teeth and Oropharynx as per pre-operative assessment

## 2023-10-09 NOTE — Progress Notes (Signed)
 Called for report on patient for their surgery. No answer. Transport sent for patient.

## 2023-10-09 NOTE — Anesthesia Postprocedure Evaluation (Signed)
 Anesthesia Post Note  Patient: Kerry Powell  Procedure(s) Performed: INCISION AND DRAINAGE OF DEEP ABSCESS, CALF (Left) APPLICATION, WOUND VAC (Left)     Patient location during evaluation: PACU Anesthesia Type: General Level of consciousness: awake Pain management: pain level controlled Vital Signs Assessment: post-procedure vital signs reviewed and stable Respiratory status: spontaneous breathing, nonlabored ventilation and respiratory function stable Cardiovascular status: blood pressure returned to baseline and stable Postop Assessment: no apparent nausea or vomiting Anesthetic complications: no   No notable events documented.  Last Vitals:  Vitals:   10/09/23 1615 10/09/23 1630  BP: (!) 104/90 109/77  Pulse: 76 76  Resp: 20 (!) 24  Temp: 36.4 C   SpO2: 95% 94%    Last Pain:  Vitals:   10/09/23 1615  TempSrc:   PainSc: 3                  [name] P Tramaine Sauls

## 2023-10-09 NOTE — Inpatient Diabetes Management (Signed)
 Inpatient Diabetes Program Recommendations  AACE/ADA: New Consensus Statement on Inpatient Glycemic Control (2015)  Target Ranges:  Prepandial:   less than 140 mg/dL      Peak postprandial:   less than 180 mg/dL (1-2 hours)      Critically ill patients:  140 - 180 mg/dL   Lab Results  Component Value Date   GLUCAP 121 (H) 10/09/2023   HGBA1C 6.4 (H) 10/07/2023    Review of Glycemic Control  Latest Reference Range & Units 10/08/23 08:01 10/08/23 12:25 10/08/23 16:12 10/08/23 21:20 10/09/23 08:24 10/09/23 11:50  Glucose-Capillary 70 - 99 mg/dL 845 (H) 97 755 (H) 685 (H) 147 (H) 121 (H)   Diabetes history: DM 2 Outpatient Diabetes medications: Farxiga  10 mg Daily, Mounjaro  5 mg Weekly Current orders for Inpatient glycemic control:  Novolog  0-9 units tid + hs A1c 6.4% on 8/11  PO prednisone  50 mg Daily Note glucose trends increase after Prednisone  dose  Inpatient Diabetes Program Recommendations:    May need to add low dose Novolog  meal coverage if eating >50% of meals if steroids continue and postprandial trends continue to increase.   Thanks,  Clotilda Bull RN, MSN, BC-ADM Inpatient Diabetes Coordinator Team Pager [phone] (8a-5p)

## 2023-10-09 NOTE — Transfer of Care (Signed)
 Immediate Anesthesia Transfer of Care Note  Patient: Kerry Powell  Procedure(s) Performed: INCISION AND DRAINAGE OF DEEP ABSCESS, CALF (Left) APPLICATION, WOUND VAC (Left)  Patient Location: PACU  Anesthesia Type:General  Level of Consciousness: awake, alert , and oriented  Airway & Oxygen Therapy: Patient Spontanous Breathing and Patient connected to face mask oxygen  Post-op Assessment: Report given to RN and Post -op Vital signs reviewed and stable  Post vital signs: Reviewed and stable  Last Vitals:  Vitals Value Taken Time  BP 106/64 10/09/23 14:53  Temp    Pulse 86 10/09/23 15:00  Resp 19 10/09/23 14:59  SpO2 87 % 10/09/23 15:00  Vitals shown include unfiled device data.  Last Pain:  Vitals:   10/09/23 1214  TempSrc: (P) Oral  PainSc:       Patients Stated Pain Goal: 0 (10/08/23 1252)  Complications: No notable events documented.

## 2023-10-09 NOTE — Care Management Important Message (Signed)
 Important Message  Patient Details  Name: Kerry Powell MRN: [id number] Date of Birth: 07/19/1945   Important Message Given:  Yes - Medicare IM     Claretta Deed 10/09/2023, 3:46 PM

## 2023-10-09 NOTE — Op Note (Signed)
 10/09/2023  2:44 PM  PATIENT:  Kerry Powell    PRE-OPERATIVE DIAGNOSIS:  LEFT LEG ABSCESS  POST-OPERATIVE DIAGNOSIS:  Same  PROCEDURE: Excision of skin soft tissue muscle and fascia and necrotic tissue left calf. Tissue sent for cultures. Local tissue transfer for wound closure 10 x 4 cm. Application of peel in place wound VAC sponge.  SURGEON:  Jerona LULLA Sage, MD  PHYSICIAN ASSISTANT:None ANESTHESIA:   General  PREOPERATIVE INDICATIONS:  Kerry Powell is a  78 y.o. female with a diagnosis of LEFT LEG ABSCESS who failed conservative measures and elected for surgical management.    The risks benefits and alternatives were discussed with the patient preoperatively including but not limited to the risks of infection, bleeding, nerve injury, cardiopulmonary complications, the need for revision surgery, among others, and the patient was willing to proceed.  OPERATIVE IMPLANTS:   * No implants in log *  @ENCIMAGES @  OPERATIVE FINDINGS: Patient had necrotic hemorrhagic tissue no purulent abscess.  OPERATIVE PROCEDURE: Patient brought the operating room and underwent a general anesthetic.  After adequate levels anesthesia obtained patient's left lower extremity was prepped using DuraPrep draped into a sterile field a timeout was called.  An elliptical incision was made around the ulcerative tissue this left the wound that was 10 x 4 cm.  There was undermining of the wound and there was necrotic tissue medially and laterally to the incision.  A Cobb elevator and rondure was used to further remove nonviable hemorrhagic tissue and this was sent for cultures.  Once the wound margins were clear the wound was irrigated with Vashe.  The fascia was intact.  The wound was closed using 2-0 nylon.  After undermining the edges local tissue transfer was used to close the wound 10 x 4 cm.  A peel in place wound VAC was applied this had a good suction fit patient was extubated taken the PACU in stable  condition.   DISCHARGE PLANNING:  Antibiotic duration: Continue antibiotics would discharge on oral antibiotics for 3 weeks  Weightbearing: Weightbearing as tolerated  Pain medication: Opioid pathway  Dressing care/ Wound VAC: Continue wound VAC for 1 week  Ambulatory devices: Walker  Discharge to: Anticipate discharge to home.  Follow-up: In the office 1 week post operative.

## 2023-10-09 NOTE — Anesthesia Preprocedure Evaluation (Addendum)
 Anesthesia Evaluation  Patient identified by MRN, date of birth, ID band Patient awake    Reviewed: Allergy & Precautions, NPO status , Patient's Chart, lab work & pertinent test results  Airway Mallampati: II  TM Distance: >3 FB Neck ROM: Full    Dental  (+) Edentulous Upper, Edentulous Lower   Pulmonary COPD,  COPD inhaler, former smoker   Pulmonary exam normal        Cardiovascular hypertension, Pt. on medications and Pt. on home beta blockers + CAD, + Peripheral Vascular Disease and +CHF  Normal cardiovascular exam  The study is normal. The study is low risk. No ST deviation was noted. LV perfusion is normal. Left ventricular function is normal. End diastolic cavity size is normal. Prior study available for comparison from 04/24/2018. No changes compared to prior study.   Low risk stress nuclear study with normal perfusion and normal left ventricular regional and global systolic function.    Neuro/Psych  Headaches PSYCHIATRIC DISORDERS Anxiety Depression Bipolar Disorder   TIACVA    GI/Hepatic Neg liver ROS, PUD,GERD  Medicated and Controlled,,  Endo/Other  diabetes  Patient on GLP-1 Agonist. Last dose 8/3  Renal/GU CRFRenal disease     Musculoskeletal  (+) Arthritis ,    Abdominal  (+) + obese  Peds  Hematology  (+) Blood dyscrasia (Plavix )   Anesthesia Other Findings LEFT LEG ABSCESS  Reproductive/Obstetrics                              Anesthesia Physical Anesthesia Plan  ASA: 3  Anesthesia Plan: General   Post-op Pain Management:    Induction: Intravenous  PONV Risk Score and Plan: 3 and Ondansetron , Dexamethasone  and Treatment may vary due to age or medical condition  Airway Management Planned:   Additional Equipment:   Intra-op Plan:   Post-operative Plan: Extubation in OR  Informed Consent: I have reviewed the patients History and Physical, chart, labs and  discussed the procedure including the risks, benefits and alternatives for the proposed anesthesia with the patient or authorized representative who has indicated his/her understanding and acceptance.   Patient has DNR.  Discussed DNR with patient and Suspend DNR.   Dental advisory given  Plan Discussed with: CRNA  Anesthesia Plan Comments:          Anesthesia Quick Evaluation

## 2023-10-09 NOTE — Progress Notes (Signed)
 PROGRESS NOTE        PATIENT DETAILS Name: Kerry Powell Age: 78 y.o. Sex: female Date of Birth: 1946/01/30 Admit Date: 10/06/2023 Admitting Physician Eva KATHEE Pore, DO ERE:Almwd, Glade PARAS, MD  Brief Summary: Patient is a 78 y.o.  female with history of DM-2, CKD stage IIIb, chronic HFpEF, COPD-who presented to [facility] on 8/10-with LLE pain/erythema/purulent discharge with associated fevers.  She was found to have sepsis secondary to LLE cellulitis with abscess.  Significant events: 8/10>> admit to [facility]  Significant studies: 8/11>> MRI tibia/fibula left: Focal collection along the anterior compartment-could reflect an abscess.  Significant microbiology data: 8/10>> blood culture: No growth  Procedures: None  Consults: Orthopedics  Subjective: Lying comfortably in bed-denies any chest pain or shortness of breath.  Left leg with continued swelling/erythema.  Objective: Vitals: Blood pressure (P) 120/67, pulse 91, temperature (P) 98.3 F (36.8 C), temperature source (P) Oral, resp. rate (P) 18, height (P) 5' 5 (1.651 m), weight (P) 89.9 kg, SpO2 (P) 95%.   Exam: Gen Exam:Alert awake-not in any distress HEENT:atraumatic, normocephalic Chest: B/L clear to auscultation anteriorly CVS:S1S2 regular Abdomen:soft non tender, non distended Extremities: Left leg-anterior aspect-erythematous-still swollen. Neurology: Non focal Skin: no rash  Pertinent Labs/Radiology:    Latest Ref Rng & Units 10/09/2023    4:15 AM 10/08/2023    5:29 AM 10/07/2023    4:05 AM  CBC  WBC 4.0 - 10.5 K/uL 11.1  14.7  20.9   Hemoglobin 12.0 - 15.0 g/dL 86.0  86.0  85.6   Hematocrit 36.0 - 46.0 % 43.1  42.9  44.4   Platelets 150 - 400 K/uL 237  230  232     Lab Results  Component Value Date   NA 137 10/09/2023   K 4.6 10/09/2023   CL 105 10/09/2023   CO2 21 (L) 10/09/2023      Assessment/Plan: Severe sepsis secondary to LLE purulent cellulitis with  abscess. Sepsis physiology has resolved Cultures negative so far Continue empiric antibiotics I&D scheduled for later today.  Acute hypoxic respiratory failure secondary to COPD exacerbation Overall improved Lungs clear to auscultation Continue bronchodilators Short prednisone  taper  HTN BP stable Metoprolol   DM-2 (A1c 6.4 on 8/11) SSI Follow/optimize.  HLD Statin  Chronic HFpEF Euvolemic on exam As needed diuretics  HTN BP stable without use of any antihypertensives Norvasc /ARB remain on hold  PAD Stable Plavix /Crestor   CKD stage IIIb At baseline Monitor electrolytes Avoid nephrotoxic agents.  Class I obesity: Estimated body mass index is 32.98 kg/m (pended) as calculated from the following:   Height as of this encounter: (P) 5' 5 (1.651 m).   Weight as of this encounter: (P) 89.9 kg.   Code status:   Code Status: Limited: Do not attempt resuscitation (DNR) -DNR-LIMITED -Do Not Intubate/DNI    DVT Prophylaxis: heparin  injection 5,000 Units Start: 10/06/23 2230   Family Communication: None at bedside   Disposition Plan: Status is: Inpatient Remains inpatient appropriate because: Severity of illness   Planned Discharge Destination:Home   Diet: Diet Order             Diet NPO time specified  Diet effective midnight                     Antimicrobial agents: Anti-infectives (From admission, onward)    Start  Dose/Rate Route Frequency Ordered Stop   10/09/23 1100  ceFAZolin  (ANCEF ) IVPB 2g/100 mL premix        2 g 200 mL/hr over 30 Minutes Intravenous On call to O.R. 10/09/23 0738 10/10/23 0559   10/08/23 2200  [MAR Hold]  vancomycin  (VANCOCIN ) 1,000 mg in sodium chloride  0.9 % 250 mL IVPB        (MAR Hold since Wed 10/09/2023 at 1213.Hold Reason: Transfer to a Procedural area)   1,000 mg 250 mL/hr over 60 Minutes Intravenous Every 48 hours 10/07/23 0208     10/07/23 1500  [MAR Hold]  cefTRIAXone  (ROCEPHIN ) 1 g in sodium chloride  0.9  % 100 mL IVPB        (MAR Hold since Wed 10/09/2023 at 1213.Hold Reason: Transfer to a Procedural area)   1 g 200 mL/hr over 30 Minutes Intravenous Every 24 hours 10/06/23 2229     10/06/23 2015  cefTRIAXone  (ROCEPHIN ) 2 g in sodium chloride  0.9 % 100 mL IVPB        2 g 200 mL/hr over 30 Minutes Intravenous Once 10/06/23 2004 10/06/23 2120   10/06/23 2015  vancomycin  (VANCOCIN ) IVPB 1000 mg/200 mL premix        1,000 mg 200 mL/hr over 60 Minutes Intravenous  Once 10/06/23 2004 10/06/23 2244        MEDICATIONS: Scheduled Meds:  chlorhexidine   15 mL Mouth/Throat Once   Or   mouth rinse  15 mL Mouth Rinse Once   chlorhexidine        [MAR Hold] clopidogrel   75 mg Oral Daily   [MAR Hold] DULoxetine   60 mg Oral Daily   [MAR Hold] ferrous sulfate   325 mg Oral QODAY   [MAR Hold] fluticasone   1 spray Each Nare Daily   [MAR Hold] heparin   5,000 Units Subcutaneous Q8H   [MAR Hold] insulin  aspart  0-5 Units Subcutaneous QHS   [MAR Hold] insulin  aspart  0-9 Units Subcutaneous TID WC   [MAR Hold] loratadine   10 mg Oral Daily   [MAR Hold] metoprolol  tartrate  25 mg Oral BID   [MAR Hold] montelukast   10 mg Oral QHS   [MAR Hold] pantoprazole   40 mg Oral Daily   povidone-iodine   2 Application Topical Once   [MAR Hold] predniSONE   50 mg Oral Q breakfast   [MAR Hold] rosuvastatin   40 mg Oral Daily   Continuous Infusions:   ceFAZolin  (ANCEF ) IV     [MAR Hold] cefTRIAXone  (ROCEPHIN )  IV Stopped (10/08/23 1446)   lactated ringers      [MAR Hold] vancomycin  (VANCOCIN ) 1,000 mg in sodium chloride  0.9 % 250 mL IVPB Stopped (10/08/23 2327)   PRN Meds:.[MAR Hold] acetaminophen  **OR** [MAR Hold] acetaminophen , [MAR Hold] albuterol , chlorhexidine , [MAR Hold] cyclobenzaprine , [MAR Hold] fentaNYL  (SUBLIMAZE ) injection, [MAR Hold] melatonin, [MAR Hold] naLOXone  (NARCAN )  injection, [MAR Hold] ondansetron  (ZOFRAN ) IV, [MAR Hold] traMADol    I have personally reviewed following labs and imaging  studies  LABORATORY DATA: CBC: Recent Labs  Lab 10/06/23 1908 10/07/23 0405 10/08/23 0529 10/09/23 0415  WBC 20.6* 20.9* 14.7* 11.1*  NEUTROABS 16.6* 17.0*  --   --   HGB 15.9* 14.3 13.9 13.9  HCT 49.6* 44.4 42.9 43.1  MCV 97.1 97.2 96.4 96.6  PLT 287 232 230 237    Basic Metabolic Panel: Recent Labs  Lab 10/06/23 1908 10/06/23 1922 10/07/23 0405 10/08/23 0529 10/09/23 0415  NA 130*  --  132* 135 137  K 4.3  --  4.3 4.4 4.6  CL  99  --  99 101 105  CO2 19*  --  23 24 21*  GLUCOSE 140*  --  120* 107* 182*  BUN 21  --  21 20 21   CREATININE 1.93*  --  1.97* 2.15* 1.92*  CALCIUM  9.1  --  8.7* 8.7* 8.9  MG  --  1.7 1.8 1.8  --   PHOS  --   --  2.7  --   --     GFR: Estimated Creatinine Clearance: 26.8 mL/min (A) (by C-G formula based on SCr of 1.92 mg/dL (H)).  Liver Function Tests: Recent Labs  Lab 10/06/23 1908 10/07/23 0405 10/08/23 0529  AST 16 22 12*  ALT 12 14 11   ALKPHOS 93 87 89  BILITOT 0.9 1.0 0.4  PROT 7.1 6.0* 6.1*  ALBUMIN 3.5 3.0* 2.9*   No results for input(s): LIPASE, AMYLASE in the last 168 hours. No results for input(s): AMMONIA in the last 168 hours.  Coagulation Profile: Recent Labs  Lab 10/06/23 1908  INR 1.0    Cardiac Enzymes: No results for input(s): CKTOTAL, CKMB, CKMBINDEX, TROPONINI in the last 168 hours.  BNP (last 3 results) No results for input(s): PROBNP in the last 8760 hours.  Lipid Profile: No results for input(s): CHOL, HDL, LDLCALC, TRIG, CHOLHDL, LDLDIRECT in the last 72 hours.  Thyroid  Function Tests: Recent Labs    10/07/23 0405  TSH 1.706    Anemia Panel: No results for input(s): VITAMINB12, FOLATE, FERRITIN, TIBC, IRON, RETICCTPCT in the last 72 hours.  Urine analysis:    Component Value Date/Time   COLORURINE YELLOW 10/08/2023 1257   APPEARANCEUR CLEAR 10/08/2023 1257   LABSPEC 1.006 10/08/2023 1257   PHURINE 6.0 10/08/2023 1257   GLUCOSEU >=500 (A)  10/08/2023 1257   GLUCOSEU >=1000 (A) 05/15/2022 1554   HGBUR NEGATIVE 10/08/2023 1257   BILIRUBINUR NEGATIVE 10/08/2023 1257   BILIRUBINUR negative 09/25/2022 1027   KETONESUR NEGATIVE 10/08/2023 1257   PROTEINUR NEGATIVE 10/08/2023 1257   UROBILINOGEN 0.2 09/25/2022 1027   UROBILINOGEN 0.2 05/15/2022 1554   NITRITE NEGATIVE 10/08/2023 1257   LEUKOCYTESUR NEGATIVE 10/08/2023 1257    Sepsis Labs: Lactic Acid, Venous    Component Value Date/Time   LATICACIDVEN 1.5 10/06/2023 1939    MICROBIOLOGY: Recent Results (from the past 240 hours)  Culture, blood (Routine x 2)     Status: None (Preliminary result)   Collection Time: 10/06/23  7:08 PM   Specimen: BLOOD  Result Value Ref Range Status   Specimen Description BLOOD RIGHT ANTECUBITAL  Final   Special Requests AEROBIC BOTTLE ONLY Blood Culture adequate volume  Final   Culture   Final    NO GROWTH 3 DAYS Performed at [facility], [address], [place], [name] [address]    Report Status PENDING  Incomplete  Culture, blood (Routine x 2)     Status: None (Preliminary result)   Collection Time: 10/06/23  7:32 PM   Specimen: BLOOD  Result Value Ref Range Status   Specimen Description BLOOD BLOOD LEFT ARM  Final   Special Requests   Final    BOTTLES DRAWN AEROBIC AND ANAEROBIC Blood Culture results may not be optimal due to an inadequate volume of blood received in culture bottles   Culture   Final    NO GROWTH 3 DAYS Performed at [facility], [address], [place], [name] [address]    Report Status PENDING  Incomplete    RADIOLOGY STUDIES/RESULTS: No results found.  LOS: 3 days   Donalda Applebaum, MD  [facility]    To contact the attending provider between 7A-7P or the covering provider during after hours 7P-7A, please log into the web site [email or web address] and access using universal [place] password for that web site. If you do not have the password, please call the hospital  operator.  10/09/2023, 1:10 PM

## 2023-10-09 NOTE — Plan of Care (Signed)

## 2023-10-09 NOTE — Interval H&P Note (Signed)
 History and Physical Interval Note:  10/09/2023 7:18 AM  Kerry Powell  has presented today for surgery, with the diagnosis of LEFT LEG ABSCESS.  The various methods of treatment have been discussed with the patient and family. After consideration of risks, benefits and other options for treatment, the patient has consented to  Procedure(s): INCISION AND DRAINAGE OF DEEP ABSCESS, CALF (Left) APPLICATION, WOUND VAC (Left) as a surgical intervention.  The patient's history has been reviewed, patient examined, no change in status, stable for surgery.  I have reviewed the patient's chart and labs.  Questions were answered to the patient's satisfaction.     Syre Knerr V Jacolyn Joaquin

## 2023-10-10 ENCOUNTER — Encounter (HOSPITAL_COMMUNITY): Payer: Self-pay | Admitting: Orthopedic Surgery

## 2023-10-10 DIAGNOSIS — L03116 Cellulitis of left lower limb: Secondary | ICD-10-CM | POA: Diagnosis not present

## 2023-10-10 DIAGNOSIS — L02416 Cutaneous abscess of left lower limb: Secondary | ICD-10-CM | POA: Diagnosis not present

## 2023-10-10 DIAGNOSIS — N1832 Chronic kidney disease, stage 3b: Secondary | ICD-10-CM | POA: Diagnosis not present

## 2023-10-10 DIAGNOSIS — J9601 Acute respiratory failure with hypoxia: Secondary | ICD-10-CM | POA: Diagnosis not present

## 2023-10-10 LAB — GLUCOSE, CAPILLARY
Glucose-Capillary: 226 mg/dL — ABNORMAL HIGH (ref 70–99)
Glucose-Capillary: 108 mg/dL — ABNORMAL HIGH (ref 70–99)
Glucose-Capillary: 181 mg/dL — ABNORMAL HIGH (ref 70–99)
Glucose-Capillary: 119 mg/dL — ABNORMAL HIGH (ref 70–99)

## 2023-10-10 LAB — BASIC METABOLIC PANEL WITH GFR
Anion gap: 9 (ref 5–15)
BUN: 25 mg/dL — ABNORMAL HIGH (ref 8–23)
CO2: 22 mmol/L (ref 22–32)
Calcium: 8.6 mg/dL — ABNORMAL LOW (ref 8.9–10.3)
Chloride: 102 mmol/L (ref 98–111)
Creatinine, Ser: 1.8 mg/dL — ABNORMAL HIGH (ref 0.44–1.00)
GFR, Estimated: 28 mL/min — ABNORMAL LOW (ref >60)
Glucose, Bld: 171 mg/dL — ABNORMAL HIGH (ref 70–99)
Potassium: 4.6 mmol/L (ref 3.5–5.1)
Sodium: 133 mmol/L — ABNORMAL LOW (ref 135–145)

## 2023-10-10 LAB — CBC
HCT: 41.6 % (ref 36.0–46.0)
Hemoglobin: 13.3 g/dL (ref 12.0–15.0)
MCH: 31.1 pg (ref 26.0–34.0)
MCHC: 32 g/dL (ref 30.0–36.0)
MCV: 97.2 fL (ref 80.0–100.0)
Platelets: 288 K/uL (ref 150–400)
RBC: 4.28 MIL/uL (ref 3.87–5.11)
RDW: 14.4 % (ref 11.5–15.5)
WBC: 12.5 K/uL — ABNORMAL HIGH (ref 4.0–10.5)
nRBC: 0 % (ref 0.0–0.2)

## 2023-10-10 MED ORDER — PREDNISONE 5 MG PO TABS
30.0000 mg | ORAL_TABLET | Freq: Every day | ORAL | Status: DC
  Administered 2023-10-11: 30 mg via ORAL
  Filled 2023-10-10: qty 2

## 2023-10-10 MED ORDER — SALINE SPRAY 0.65 % NA SOLN
1.0000 | NASAL | Status: DC | PRN
  Administered 2023-10-10: 1 via NASAL
  Filled 2023-10-10: qty 44

## 2023-10-10 MED ORDER — BISACODYL 10 MG RE SUPP: 10.0000 mg | Freq: Every day | RECTAL | Status: DC | PRN

## 2023-10-10 MED ORDER — VANCOMYCIN HCL 1000 MG IV SOLR
1000.0000 mg | INTRAVENOUS | Status: DC
  Filled 2023-10-10: qty 20

## 2023-10-10 MED ORDER — POLYETHYLENE GLYCOL 3350 17 G PO PACK
17.0000 g | PACK | Freq: Two times a day (BID) | ORAL | Status: DC
  Administered 2023-10-10 – 2023-10-12 (×6): 17 g via ORAL
  Filled 2023-10-10 (×6): qty 1

## 2023-10-10 MED ORDER — POLYVINYL ALCOHOL 1.4 % OP SOLN
1.0000 [drp] | OPHTHALMIC | Status: DC | PRN
  Administered 2023-10-10: 1 [drp] via OPHTHALMIC
  Filled 2023-10-10: qty 15

## 2023-10-10 MED ORDER — LINEZOLID 600 MG PO TABS
600.0000 mg | ORAL_TABLET | Freq: Two times a day (BID) | ORAL | Status: DC
  Administered 2023-10-10 – 2023-10-13 (×7): 600 mg via ORAL
  Filled 2023-10-10 (×7): qty 1

## 2023-10-10 NOTE — Progress Notes (Signed)
 Orthopedic Tech Progress Note Patient Details:  Kerry Powell 10-Dec-1945 [id number]  Ortho Devices Type of Ortho Device: CAM walker Ortho Device/Splint Location: LLE Ortho Device/Splint Interventions: Ordered, Application   Post Interventions Patient Tolerated: Well  [name] 10/10/2023, 9:24 AM

## 2023-10-10 NOTE — Progress Notes (Signed)
 [facility]  Left LE dressing C/D Vac without drainage, good suction Lungs non labored  S/P  PROCEDURE: Excision of skin soft tissue muscle and fascia and necrotic tissue left calf. Tissue sent for cultures. Local tissue transfer for wound closure 10 x 4 cm. Application of peel in place wound VAC sponge.  Cultures negative to date Plan for Prevena vac at discharge.  Vac will stay for 1 week until f/u visit Cam walking boot WBAT F/U with Dr. Harden in a week   Maurilio Deland Collet PA-C [phone]

## 2023-10-10 NOTE — Progress Notes (Signed)
 PROGRESS NOTE        PATIENT DETAILS Name: Kerry Powell Age: 78 y.o. Sex: female Date of Birth: 1945/10/15 Admit Date: 10/06/2023 Admitting Physician Eva KATHEE Pore, DO ERE:Almwd, Glade PARAS, MD  Brief Summary: Patient is a 78 y.o.  female with history of DM-2, CKD stage IIIb, chronic HFpEF, COPD-who presented to [facility] on 8/10-with LLE pain/erythema/purulent discharge with associated fevers.  She was found to have sepsis secondary to LLE cellulitis with abscess.  Significant events: 8/10>> admit to [facility]  Significant studies: 8/11>> MRI tibia/fibula left: Focal collection along the anterior compartment-could reflect an abscess.  Significant microbiology data: 8/10>> blood culture: No growth  Procedures: 8/13>> excision of soft tissue muscle/fascia/necrotic tissue left calf.  Application of wound VAC sponge.  Consults: Orthopedics  Subjective: Some pain at the operative site.  No other issues overnight.  Objective: Vitals: Blood pressure 107/73, pulse 73, temperature 97.7 F (36.5 C), temperature source Oral, resp. rate 14, height (P) 5' 5 (1.651 m), weight (P) 89.9 kg, SpO2 93%.   Exam: Awake/alert Chest: Clear to auscultation CVS: S1-S2 regular Abdomen: Soft nontender nondistended Extremities: No edema Nonfocal exam.  Pertinent Labs/Radiology:    Latest Ref Rng & Units 10/10/2023    5:18 AM 10/09/2023    4:15 AM 10/08/2023    5:29 AM  CBC  WBC 4.0 - 10.5 K/uL 12.5  11.1  14.7   Hemoglobin 12.0 - 15.0 g/dL 86.6  86.0  86.0   Hematocrit 36.0 - 46.0 % 41.6  43.1  42.9   Platelets 150 - 400 K/uL 288  237  230     Lab Results  Component Value Date   NA 133 (L) 10/10/2023   K 4.6 10/10/2023   CL 102 10/10/2023   CO2 22 10/10/2023      Assessment/Plan: Severe sepsis secondary to LLE purulent cellulitis with abscess. Sepsis physiology has resolved-leukocytosis is likely due to steroids. S/p incision/debridement by Dr. Harden on  8/13 Cultures negative so far Stop Vanco/Rocephin -switch to Zyvox . Appreciate orthopedic input-Prevena VAC plan for discharge, WBAT as tolerated.  Acute hypoxic respiratory failure secondary to COPD exacerbation Overall improved Lungs clear to auscultation Continue bronchodilators Continue to taper prednisone   HTN BP stable Metoprolol   DM-2 (A1c 6.4 on 8/11) mild hyperglycemia due to steroids. SSI Follow/optimize.  Recent Labs    10/09/23 2000 10/10/23 0826 10/10/23 1134  GLUCAP 259* 226* 108*     HLD Statin  Chronic HFpEF Euvolemic on exam As needed diuretics  HTN BP stable without use of any antihypertensives Norvasc /ARB remain on hold-resume when able  PAD Stable Plavix /Crestor   CKD stage IIIb At baseline Monitor electrolytes Avoid nephrotoxic agents.  Class I obesity: Estimated body mass index is 32.98 kg/m (pended) as calculated from the following:   Height as of this encounter: (P) 5' 5 (1.651 m).   Weight as of this encounter: (P) 89.9 kg.   Code status:   Code Status: Limited: Do not attempt resuscitation (DNR) -DNR-LIMITED -Do Not Intubate/DNI    DVT Prophylaxis: heparin  injection 5,000 Units Start: 10/06/23 2230   Family Communication: None at bedside   Disposition Plan: Status is: Inpatient Remains inpatient appropriate because: Severity of illness   Planned Discharge Destination:Home   Diet: Diet Order             Diet heart healthy/carb modified Fluid consistency: Thin  Diet effective now                     Antimicrobial agents: Anti-infectives (From admission, onward)    Start     Dose/Rate Route Frequency Ordered Stop   10/10/23 1200  vancomycin  (VANCOCIN ) 1,000 mg in sodium chloride  0.9 % 250 mL IVPB  Status:  Discontinued        1,000 mg 250 mL/hr over 60 Minutes Intravenous Every 48 hours 10/10/23 1049 10/10/23 1054   10/10/23 1145  vancomycin  (VANCOCIN ) 1,000 mg in sodium chloride  0.9 % 250 mL IVPB  Status:   Discontinued        1,000 mg 250 mL/hr over 60 Minutes Intravenous Every 48 hours 10/10/23 1049 10/10/23 1049   10/10/23 1145  linezolid  (ZYVOX ) tablet 600 mg        600 mg Oral Every 12 hours 10/10/23 1055 10/31/23 0959   10/09/23 1100  ceFAZolin  (ANCEF ) IVPB 2g/100 mL premix  Status:  Discontinued        2 g 200 mL/hr over 30 Minutes Intravenous On call to O.R. 10/09/23 0738 10/09/23 1706   10/08/23 2200  vancomycin  (VANCOCIN ) 1,000 mg in sodium chloride  0.9 % 250 mL IVPB  Status:  Discontinued        1,000 mg 250 mL/hr over 60 Minutes Intravenous Every 48 hours 10/07/23 0208 10/10/23 1045   10/07/23 1500  cefTRIAXone  (ROCEPHIN ) 1 g in sodium chloride  0.9 % 100 mL IVPB  Status:  Discontinued        1 g 200 mL/hr over 30 Minutes Intravenous Every 24 hours 10/06/23 2229 10/10/23 1045   10/06/23 2015  cefTRIAXone  (ROCEPHIN ) 2 g in sodium chloride  0.9 % 100 mL IVPB        2 g 200 mL/hr over 30 Minutes Intravenous Once 10/06/23 2004 10/06/23 2120   10/06/23 2015  vancomycin  (VANCOCIN ) IVPB 1000 mg/200 mL premix        1,000 mg 200 mL/hr over 60 Minutes Intravenous  Once 10/06/23 2004 10/06/23 2244        MEDICATIONS: Scheduled Meds:  clopidogrel   75 mg Oral Daily   DULoxetine   60 mg Oral Daily   ferrous sulfate   325 mg Oral QODAY   fluticasone   1 spray Each Nare Daily   heparin   5,000 Units Subcutaneous Q8H   insulin  aspart  0-5 Units Subcutaneous QHS   insulin  aspart  0-9 Units Subcutaneous TID WC   linezolid   600 mg Oral Q12H   loratadine   10 mg Oral Daily   metoprolol  tartrate  25 mg Oral BID   montelukast   10 mg Oral QHS   pantoprazole   40 mg Oral Daily   polyethylene glycol  17 g Oral BID   predniSONE   40 mg Oral Q breakfast   rosuvastatin   40 mg Oral Daily   Continuous Infusions:   PRN Meds:.acetaminophen  **OR** acetaminophen , albuterol , artificial tears, bisacodyl , cyclobenzaprine , fentaNYL  (SUBLIMAZE ) injection, melatonin, naLOXone  (NARCAN )  injection, ondansetron   (ZOFRAN ) IV, oxyCODONE , sodium chloride    I have personally reviewed following labs and imaging studies  LABORATORY DATA: CBC: Recent Labs  Lab 10/06/23 1908 10/07/23 0405 10/08/23 0529 10/09/23 0415 10/10/23 0518  WBC 20.6* 20.9* 14.7* 11.1* 12.5*  NEUTROABS 16.6* 17.0*  --   --   --   HGB 15.9* 14.3 13.9 13.9 13.3  HCT 49.6* 44.4 42.9 43.1 41.6  MCV 97.1 97.2 96.4 96.6 97.2  PLT 287 232 230 237 288    Basic Metabolic Panel:  Recent Labs  Lab 10/06/23 1908 10/06/23 1922 10/07/23 0405 10/08/23 0529 10/09/23 0415 10/10/23 0518  NA 130*  --  132* 135 137 133*  K 4.3  --  4.3 4.4 4.6 4.6  CL 99  --  99 101 105 102  CO2 19*  --  23 24 21* 22  GLUCOSE 140*  --  120* 107* 182* 171*  BUN 21  --  21 20 21  25*  CREATININE 1.93*  --  1.97* 2.15* 1.92* 1.80*  CALCIUM  9.1  --  8.7* 8.7* 8.9 8.6*  MG  --  1.7 1.8 1.8  --   --   PHOS  --   --  2.7  --   --   --     GFR: Estimated Creatinine Clearance: 28.5 mL/min (A) (by C-G formula based on SCr of 1.8 mg/dL (H)).  Liver Function Tests: Recent Labs  Lab 10/06/23 1908 10/07/23 0405 10/08/23 0529  AST 16 22 12*  ALT 12 14 11   ALKPHOS 93 87 89  BILITOT 0.9 1.0 0.4  PROT 7.1 6.0* 6.1*  ALBUMIN 3.5 3.0* 2.9*   No results for input(s): LIPASE, AMYLASE in the last 168 hours. No results for input(s): AMMONIA in the last 168 hours.  Coagulation Profile: Recent Labs  Lab 10/06/23 1908  INR 1.0    Cardiac Enzymes: No results for input(s): CKTOTAL, CKMB, CKMBINDEX, TROPONINI in the last 168 hours.  BNP (last 3 results) No results for input(s): PROBNP in the last 8760 hours.  Lipid Profile: No results for input(s): CHOL, HDL, LDLCALC, TRIG, CHOLHDL, LDLDIRECT in the last 72 hours.  Thyroid  Function Tests: No results for input(s): TSH, T4TOTAL, FREET4, T3FREE, THYROIDAB in the last 72 hours.   Anemia Panel: No results for input(s): VITAMINB12, FOLATE, FERRITIN, TIBC,  IRON, RETICCTPCT in the last 72 hours.  Urine analysis:    Component Value Date/Time   COLORURINE YELLOW 10/08/2023 1257   APPEARANCEUR CLEAR 10/08/2023 1257   LABSPEC 1.006 10/08/2023 1257   PHURINE 6.0 10/08/2023 1257   GLUCOSEU >=500 (A) 10/08/2023 1257   GLUCOSEU >=1000 (A) 05/15/2022 1554   HGBUR NEGATIVE 10/08/2023 1257   BILIRUBINUR NEGATIVE 10/08/2023 1257   BILIRUBINUR negative 09/25/2022 1027   KETONESUR NEGATIVE 10/08/2023 1257   PROTEINUR NEGATIVE 10/08/2023 1257   UROBILINOGEN 0.2 09/25/2022 1027   UROBILINOGEN 0.2 05/15/2022 1554   NITRITE NEGATIVE 10/08/2023 1257   LEUKOCYTESUR NEGATIVE 10/08/2023 1257    Sepsis Labs: Lactic Acid, Venous    Component Value Date/Time   LATICACIDVEN 1.5 10/06/2023 1939    MICROBIOLOGY: Recent Results (from the past 240 hours)  Culture, blood (Routine x 2)     Status: None (Preliminary result)   Collection Time: 10/06/23  7:08 PM   Specimen: BLOOD  Result Value Ref Range Status   Specimen Description BLOOD RIGHT ANTECUBITAL  Final   Special Requests AEROBIC BOTTLE ONLY Blood Culture adequate volume  Final   Culture   Final    NO GROWTH 4 DAYS Performed at [facility], [address] [place], [name] [address]    Report Status PENDING  Incomplete  Culture, blood (Routine x 2)     Status: None (Preliminary result)   Collection Time: 10/06/23  7:32 PM   Specimen: BLOOD  Result Value Ref Range Status   Specimen Description BLOOD BLOOD LEFT ARM  Final   Special Requests   Final    BOTTLES DRAWN AEROBIC AND ANAEROBIC Blood Culture results may not be optimal  due to an inadequate volume of blood received in culture bottles   Culture   Final    NO GROWTH 4 DAYS Performed at [facility], [address], [place], [name] [address]    Report Status PENDING  Incomplete  Surgical pcr screen     Status: None   Collection Time: 10/09/23  9:54 AM   Specimen: Nasal Mucosa; Nasal Swab  Result Value Ref Range Status    MRSA, PCR NEGATIVE NEGATIVE Final   Staphylococcus aureus NEGATIVE NEGATIVE Final    Comment: (NOTE) The Xpert SA Assay (FDA approved for NASAL specimens in patients 104 years of age and older), is one component of a comprehensive surveillance program. It is not intended to diagnose infection nor to guide or monitor treatment. Performed at [facility], [address], [place], [name] [address]     RADIOLOGY STUDIES/RESULTS: No results found.   LOS: 4 days   Donalda Applebaum, MD  [facility]    To contact the attending provider between 7A-7P or the covering provider during after hours 7P-7A, please log into the web site [email or web address] and access using universal [place] password for that web site. If you do not have the password, please call the hospital operator.  10/10/2023, 12:12 PM

## 2023-10-10 NOTE — Evaluation (Signed)
 Physical Therapy Evaluation Patient Details Name: Kerry Powell MRN: [id number] DOB: 1945-06-19 Today's Date: 10/10/2023  History of Present Illness  Patient is a 78 y.o.  female who presented to [facility] on 8/10-with LLE pain/erythema/purulent discharge with associated fevers.  She was found to have sepsis secondary to LLE cellulitis with abscess. Pt is s/p I&D on 8/13. history of DM-2, CKD stage IIIb, chronic HFpEF, COPD.  Clinical Impression  Pt presents with admitting diagnosis above. Pt today was able to ambulate in hallway with RW at supervision level. Pt noted to briefly desat to 79% on 4L with movement however accuracy questionable due to poor pleth. Pt immediately recovered to 93% on 4L once standing still. 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 Recommend HHPT upon DC however pt may progress quickly to no PT follow up needed. PT will continue to follow.         If plan is discharge home, recommend the following: A little help with walking and/or transfers;A little help with bathing/dressing/bathroom;Assistance with cooking/housework;Direct supervision/assist for medications management;Assist for transportation;Help with stairs or ramp for entrance   Can travel by private vehicle        Equipment Recommendations None recommended by PT  Recommendations for Other Services       Functional Status Assessment Patient has had a recent decline in their functional status and demonstrates the ability to make significant improvements in function in a reasonable and predictable amount of time.     Precautions / Restrictions Precautions Precautions: Fall Recall of Precautions/Restrictions: Intact Precaution/Restrictions Comments: Baseline 2L? Restrictions Weight Bearing Restrictions Per Provider Order: No      Mobility  Bed Mobility Overal bed mobility: Modified Independent                   Transfers Overall transfer level: Needs assistance Equipment used: Rolling walker (2 wheels) Transfers: Sit to/from Stand Sit to Stand: Supervision           General transfer comment: good hand placement.    Ambulation/Gait Ambulation/Gait assistance: Supervision Gait Distance (Feet): 150 Feet Assistive device: Rolling walker (2 wheels) Gait Pattern/deviations: Step-through pattern Gait velocity: decreased     General Gait Details: no LOB noted. pt overall steady.  Stairs            Wheelchair Mobility     Tilt Bed    Modified Rankin (Stroke Patients Only)       Balance Overall balance assessment: Mild deficits observed, not formally tested                                           Pertinent Vitals/Pain Pain Assessment Pain Assessment: No/denies pain    Home Living Family/patient expects to be discharged to:: Private residence Living Arrangements: Spouse/significant other;Children Available Help at Discharge: Family;Available 24 hours/day Type of Home: House Home Access: Stairs to enter;Ramped entrance   Entrance Stairs-Number of Steps: 3   Home Layout: Two level;Able to live on main level with bedroom/bathroom Home Equipment: Grab bars - tub/shower;Shower Counsellor (2 wheels);Cane - single point;Rollator (4 wheels);Hand held shower head;Wheelchair - manual Additional Comments: Most of the DME is husbands but pt can use it if needed (Pulled from previous admission)    Prior Function Prior Level of Function : Independent/Modified Independent;Driving  Mobility Comments: Pt reports intermittent use of RW ADLs Comments: Pt is independent but 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 At baseline she assists him with his ADL's. (Son has taken over ADLs for husband since previous admission.)     Extremity/Trunk Assessment   Upper Extremity Assessment Upper Extremity Assessment:  Overall WFL for tasks assessed    Lower Extremity Assessment Lower Extremity Assessment: Overall WFL for tasks assessed    Cervical / Trunk Assessment Cervical / Trunk Assessment: Normal  Communication   Communication Communication: No apparent difficulties    Cognition Arousal: Alert Behavior During Therapy: WFL for tasks assessed/performed   PT - Cognitive impairments: No apparent impairments                         Following commands: Intact       Cueing Cueing Techniques: Verbal cues, Tactile cues     General Comments General comments (skin integrity, edema, etc.): Pt noted to briefly desat to 79% on 4L with movement however accuracy questionable due to poor pleth. Pt immediately recovered to 93% on 4L once standing still.    Exercises     Assessment/Plan    PT Assessment Patient needs continued PT services  PT Problem List Decreased strength;Decreased range of motion;Decreased activity tolerance;Decreased balance;Decreased mobility;Decreased coordination;Decreased cognition;Decreased knowledge of use of DME;Decreased safety awareness;Decreased knowledge of precautions;Cardiopulmonary status limiting activity       PT Treatment Interventions DME instruction;Gait training;Stair training;Functional mobility training;Therapeutic activities;Therapeutic exercise;Balance training;Neuromuscular re-education;Cognitive remediation;Patient/family education;Wheelchair mobility training    PT Goals (Current goals can be found in the Care Plan section)       Frequency Min 2X/week     Co-evaluation               AM-PAC PT 6 Clicks Mobility  Outcome Measure Help needed turning from your back to your side while in a flat bed without using bedrails?: None Help needed moving from lying on your back to sitting on the side of a flat bed without using bedrails?: None Help needed moving to and from a bed to a chair (including a wheelchair)?: A Little Help needed  standing up from a chair using your arms (e.g., wheelchair or bedside chair)?: A Little Help needed to walk in hospital room?: A Little Help needed climbing 3-5 steps with a railing? : A Little 6 Click Score: 20    End of Session Equipment Utilized During Treatment: Gait belt;Oxygen Activity Tolerance: Patient tolerated treatment well Patient left: in chair;with call bell/phone within reach;with chair alarm set Nurse Communication: Mobility status PT Visit Diagnosis: Other abnormalities of gait and mobility (R26.89)    Time: 8660-8584 PT Time Calculation (min) (ACUTE ONLY): 36 min   Charges:   PT Evaluation $PT Eval Moderate Complexity: 1 Mod PT Treatments $Gait Training: 8-22 mins PT General Charges $$ ACUTE PT VISIT: 1 Visit         Sueellen NOVAK, PT, DPT Acute Rehab Services [phone]   Krystianna Soth 10/10/2023, 3:43 PM

## 2023-10-10 NOTE — Plan of Care (Signed)
 Pt has rested quietly throughout the night with no distress noted. Alert and oriented. On O24LNC. SR on the monitor. Up to [facility] to void. Medicated for pain once with relief noted. Wound vac intact to left leg and on suctioning. No complaints voiced.     Problem: Tissue Perfusion: Goal: Adequacy of tissue perfusion will improve Outcome: Progressing   Problem: Education: Goal: Knowledge of General Education information will improve Description: Including pain rating scale, medication(s)/side effects and non-pharmacologic comfort measures Outcome: Progressing   Problem: Health Behavior/Discharge Planning: Goal: Ability to manage health-related needs will improve Outcome: Progressing   Problem: Clinical Measurements: Goal: Respiratory complications will improve Outcome: Progressing Goal: Cardiovascular complication will be avoided Outcome: Progressing   Problem: Activity: Goal: Risk for activity intolerance will decrease Outcome: Progressing   Problem: Pain Managment: Goal: General experience of comfort will improve and/or be controlled Outcome: Progressing

## 2023-10-10 NOTE — Plan of Care (Signed)
  Problem: Education: Goal: Ability to describe self-care measures that may prevent or decrease complications (Diabetes Survival Skills Education) will improve Outcome: Progressing Goal: Individualized Educational Video(s) Outcome: Progressing   Problem: Coping: Goal: Ability to adjust to condition or change in health will improve Outcome: Progressing   Problem: Fluid Volume: Goal: Ability to maintain a balanced intake and output will improve Outcome: Progressing   Problem: Health Behavior/Discharge Planning: Goal: Ability to identify and utilize available resources and services will improve Outcome: Progressing   Problem: Metabolic: Goal: Ability to maintain appropriate glucose levels will improve Outcome: Progressing   Problem: Nutritional: Goal: Maintenance of adequate nutrition will improve Outcome: Progressing   Problem: Skin Integrity: Goal: Risk for impaired skin integrity will decrease Outcome: Progressing   Problem: Tissue Perfusion: Goal: Adequacy of tissue perfusion will improve Outcome: Progressing   Problem: Education: Goal: Knowledge of General Education information will improve Description: Including pain rating scale, medication(s)/side effects and non-pharmacologic comfort measures Outcome: Progressing   Problem: Clinical Measurements: Goal: Ability to maintain clinical measurements within normal limits will improve Outcome: Progressing Goal: Will remain free from infection Outcome: Progressing   Problem: Nutrition: Goal: Adequate nutrition will be maintained Outcome: Progressing   Problem: Coping: Goal: Level of anxiety will decrease Outcome: Progressing   Problem: Pain Managment: Goal: General experience of comfort will improve and/or be controlled Outcome: Progressing   Problem: Safety: Goal: Ability to remain free from injury will improve Outcome: Progressing   Problem: Skin Integrity: Goal: Risk for impaired skin integrity will  decrease Outcome: Progressing

## 2023-10-10 NOTE — Progress Notes (Addendum)
   10/10/23 0905  Mobility  Activity Pivoted/transferred from bed to chair  Level of Assistance Contact guard assist, steadying assist  Assistive Device Front wheel walker  Activity Response Tolerated fair  Mobility Referral Yes  Mobility visit 1 Mobility  Mobility Specialist Start Time (ACUTE ONLY) 0905  Mobility Specialist Stop Time (ACUTE ONLY) 0930  Mobility Specialist Time Calculation (min) (ACUTE ONLY) 25 min   Mobility Specialist: Progress Note- Visits:2  Pre-Mobility:      HR 75, SpO2 94% 4L Post-Mobility:    HR 72, SpO2 94% 4L  Pt agreeable to mobility session - received in bed. C/o LLE pain rated 8/10. Transferred with Cam boot on. Returned to chair with all needs met - call bell within reach. Chair alarm on.   ______________________________________________________________________________  Post-Mobility:    HR 69, SpO2 96% 4L  Pt agreeable to mobility session - received in chair. C/o LLE pain rated 10/10. Transferred with Cam boot on. Returned to bed with all needs met - call bell within reach. Bed alarm on.   Virgle Boards, BS Mobility Specialist Please contact via SecureChat or  Rehab office at [phone].

## 2023-10-11 ENCOUNTER — Encounter (HOSPITAL_COMMUNITY): Payer: Self-pay | Admitting: Orthopedic Surgery

## 2023-10-11 ENCOUNTER — Ambulatory Visit (HOSPITAL_COMMUNITY): Admit: 2023-10-11 | Admitting: Orthopedic Surgery

## 2023-10-11 DIAGNOSIS — J9601 Acute respiratory failure with hypoxia: Secondary | ICD-10-CM | POA: Diagnosis not present

## 2023-10-11 DIAGNOSIS — L02416 Cutaneous abscess of left lower limb: Secondary | ICD-10-CM | POA: Diagnosis not present

## 2023-10-11 DIAGNOSIS — N1832 Chronic kidney disease, stage 3b: Secondary | ICD-10-CM | POA: Diagnosis not present

## 2023-10-11 DIAGNOSIS — L03116 Cellulitis of left lower limb: Secondary | ICD-10-CM | POA: Diagnosis not present

## 2023-10-11 LAB — CULTURE, BLOOD (ROUTINE X 2)
Culture: NO GROWTH
Culture: NO GROWTH
Special Requests: ADEQUATE

## 2023-10-11 LAB — BASIC METABOLIC PANEL WITH GFR
Anion gap: 10 (ref 5–15)
BUN: 33 mg/dL — ABNORMAL HIGH (ref 8–23)
CO2: 24 mmol/L (ref 22–32)
Calcium: 8.6 mg/dL — ABNORMAL LOW (ref 8.9–10.3)
Chloride: 103 mmol/L (ref 98–111)
Creatinine, Ser: 1.75 mg/dL — ABNORMAL HIGH (ref 0.44–1.00)
GFR, Estimated: 29 mL/min — ABNORMAL LOW (ref >60)
Glucose, Bld: 116 mg/dL — ABNORMAL HIGH (ref 70–99)
Potassium: 4.3 mmol/L (ref 3.5–5.1)
Sodium: 137 mmol/L (ref 135–145)

## 2023-10-11 LAB — GLUCOSE, CAPILLARY
Glucose-Capillary: 83 mg/dL (ref 70–99)
Glucose-Capillary: 144 mg/dL — ABNORMAL HIGH (ref 70–99)
Glucose-Capillary: 182 mg/dL — ABNORMAL HIGH (ref 70–99)
Glucose-Capillary: 209 mg/dL — ABNORMAL HIGH (ref 70–99)

## 2023-10-11 SURGERY — ARTHROPLASTY, KNEE, TOTAL
Anesthesia: General | Site: Knee | Laterality: Left

## 2023-10-11 MED ORDER — FUROSEMIDE 10 MG/ML IJ SOLN
40.0000 mg | Freq: Once | INTRAMUSCULAR | Status: AC
  Administered 2023-10-11: 40 mg via INTRAVENOUS
  Filled 2023-10-11: qty 4

## 2023-10-11 MED ORDER — PREDNISONE 20 MG PO TABS
20.0000 mg | ORAL_TABLET | Freq: Every day | ORAL | Status: DC
  Administered 2023-10-12 – 2023-10-13 (×2): 20 mg via ORAL
  Filled 2023-10-11 (×2): qty 1

## 2023-10-11 NOTE — Progress Notes (Signed)
 PROGRESS NOTE        PATIENT DETAILS Name: Kerry Powell Age: 78 y.o. Sex: female Date of Birth: Sep 04, 1945 Admit Date: 10/06/2023 Admitting Physician Eva KATHEE Pore, DO ERE:Almwd, Glade PARAS, MD  Brief Summary: Patient is a 78 y.o.  female with history of DM-2, CKD stage IIIb, chronic HFpEF, COPD-who presented to [facility] on 8/10-with LLE pain/erythema/purulent discharge with associated fevers.  She was found to have sepsis secondary to LLE cellulitis with abscess.  Significant events: 8/10>> admit to [facility]  Significant studies: 8/11>> MRI tibia/fibula left: Focal collection along the anterior compartment-could reflect an abscess.  Significant microbiology data: 8/10>> blood culture: No growth  Procedures: 8/13>> excision of soft tissue muscle/fascia/necrotic tissue left calf.  Application of wound VAC sponge.  Consults: Orthopedics  Subjective: Some pain at the operative site-remains on 3 4 L of oxygen.  Lungs are clear to auscultation.  Objective: Vitals: Blood pressure 102/67, pulse 66, temperature 98 F (36.7 C), temperature source Oral, resp. rate 20, height (P) 5' 5 (1.651 m), weight 100.5 kg, SpO2 93%.   Exam: Awake/alert Chest: Clear to auscultation CVS: S1-S2 regular Abdomen: Soft nontender Extremities: Left leg wrapped.  Pertinent Labs/Radiology:    Latest Ref Rng & Units 10/10/2023    5:18 AM 10/09/2023    4:15 AM 10/08/2023    5:29 AM  CBC  WBC 4.0 - 10.5 K/uL 12.5  11.1  14.7   Hemoglobin 12.0 - 15.0 g/dL 86.6  86.0  86.0   Hematocrit 36.0 - 46.0 % 41.6  43.1  42.9   Platelets 150 - 400 K/uL 288  237  230     Lab Results  Component Value Date   NA 137 10/11/2023   K 4.3 10/11/2023   CL 103 10/11/2023   CO2 24 10/11/2023      Assessment/Plan: Severe sepsis secondary to LLE purulent cellulitis with abscess. Sepsis physiology has resolved-leukocytosis is likely due to steroids. S/p incision/debridement by Dr. Harden on  8/13 Cultures negative so far On Zyvox   Appreciate orthopedic input-Prevena VAC plan for discharge, WBAT as tolerated.  Acute hypoxic respiratory failure secondary to COPD exacerbation Overall improved Lungs clear to auscultation Continue bronchodilators Continue to taper prednisone  Have discussed with nursing staff-attempt to taper off oxygen today. May need to be assessed for home O2 requirement prior to discharge  HTN BP stable Metoprolol   DM-2 (A1c 6.4 on 8/11) mild hyperglycemia due to steroids. SSI Follow/optimize.  Recent Labs    10/10/23 1634 10/10/23 2117 10/11/23 0759  GLUCAP 181* 119* 83     HLD Statin  Chronic HFpEF Euvolemic on exam But will give 1 dose of Lasix  to ensure negative balance.  HTN BP stable without use of any antihypertensives Norvasc /ARB remain on hold-resume when able  PAD Stable Plavix /Crestor   CKD stage IIIb At baseline Monitor electrolytes Avoid nephrotoxic agents.  Class I obesity: Estimated body mass index is 36.87 kg/m (pended) as calculated from the following:   Height as of this encounter: (P) 5' 5 (1.651 m).   Weight as of this encounter: 100.5 kg.   Code status:   Code Status: Limited: Do not attempt resuscitation (DNR) -DNR-LIMITED -Do Not Intubate/DNI    DVT Prophylaxis: heparin  injection 5,000 Units Start: 10/06/23 2230   Family Communication: None at bedside   Disposition Plan: Status is: Inpatient Remains inpatient appropriate because: Severity of  illness   Planned Discharge Destination:Home   Diet: Diet Order             Diet heart healthy/carb modified Fluid consistency: Thin  Diet effective now                     Antimicrobial agents: Anti-infectives (From admission, onward)    Start     Dose/Rate Route Frequency Ordered Stop   10/10/23 1200  vancomycin  (VANCOCIN ) 1,000 mg in sodium chloride  0.9 % 250 mL IVPB  Status:  Discontinued        1,000 mg 250 mL/hr over 60 Minutes  Intravenous Every 48 hours 10/10/23 1049 10/10/23 1054   10/10/23 1145  vancomycin  (VANCOCIN ) 1,000 mg in sodium chloride  0.9 % 250 mL IVPB  Status:  Discontinued        1,000 mg 250 mL/hr over 60 Minutes Intravenous Every 48 hours 10/10/23 1049 10/10/23 1049   10/10/23 1145  linezolid  (ZYVOX ) tablet 600 mg        600 mg Oral Every 12 hours 10/10/23 1055 10/31/23 0959   10/09/23 1100  ceFAZolin  (ANCEF ) IVPB 2g/100 mL premix  Status:  Discontinued        2 g 200 mL/hr over 30 Minutes Intravenous On call to O.R. 10/09/23 0738 10/09/23 1706   10/08/23 2200  vancomycin  (VANCOCIN ) 1,000 mg in sodium chloride  0.9 % 250 mL IVPB  Status:  Discontinued        1,000 mg 250 mL/hr over 60 Minutes Intravenous Every 48 hours 10/07/23 0208 10/10/23 1045   10/07/23 1500  cefTRIAXone  (ROCEPHIN ) 1 g in sodium chloride  0.9 % 100 mL IVPB  Status:  Discontinued        1 g 200 mL/hr over 30 Minutes Intravenous Every 24 hours 10/06/23 2229 10/10/23 1045   10/06/23 2015  cefTRIAXone  (ROCEPHIN ) 2 g in sodium chloride  0.9 % 100 mL IVPB        2 g 200 mL/hr over 30 Minutes Intravenous Once 10/06/23 2004 10/06/23 2120   10/06/23 2015  vancomycin  (VANCOCIN ) IVPB 1000 mg/200 mL premix        1,000 mg 200 mL/hr over 60 Minutes Intravenous  Once 10/06/23 2004 10/06/23 2244        MEDICATIONS: Scheduled Meds:  clopidogrel   75 mg Oral Daily   DULoxetine   60 mg Oral Daily   ferrous sulfate   325 mg Oral QODAY   fluticasone   1 spray Each Nare Daily   heparin   5,000 Units Subcutaneous Q8H   insulin  aspart  0-5 Units Subcutaneous QHS   insulin  aspart  0-9 Units Subcutaneous TID WC   linezolid   600 mg Oral Q12H   loratadine   10 mg Oral Daily   metoprolol  tartrate  25 mg Oral BID   montelukast   10 mg Oral QHS   pantoprazole   40 mg Oral Daily   polyethylene glycol  17 g Oral BID   predniSONE   30 mg Oral Q breakfast   rosuvastatin   40 mg Oral Daily   Continuous Infusions:   PRN Meds:.acetaminophen  **OR**  acetaminophen , albuterol , artificial tears, bisacodyl , cyclobenzaprine , fentaNYL  (SUBLIMAZE ) injection, melatonin, naLOXone  (NARCAN )  injection, ondansetron  (ZOFRAN ) IV, oxyCODONE , sodium chloride    I have personally reviewed following labs and imaging studies  LABORATORY DATA: CBC: Recent Labs  Lab 10/06/23 1908 10/07/23 0405 10/08/23 0529 10/09/23 0415 10/10/23 0518  WBC 20.6* 20.9* 14.7* 11.1* 12.5*  NEUTROABS 16.6* 17.0*  --   --   --   HGB 15.9*  14.3 13.9 13.9 13.3  HCT 49.6* 44.4 42.9 43.1 41.6  MCV 97.1 97.2 96.4 96.6 97.2  PLT 287 232 230 237 288    Basic Metabolic Panel: Recent Labs  Lab 10/06/23 1922 10/07/23 0405 10/08/23 0529 10/09/23 0415 10/10/23 0518 10/11/23 0538  NA  --  132* 135 137 133* 137  K  --  4.3 4.4 4.6 4.6 4.3  CL  --  99 101 105 102 103  CO2  --  23 24 21* 22 24  GLUCOSE  --  120* 107* 182* 171* 116*  BUN  --  21 20 21  25* 33*  CREATININE  --  1.97* 2.15* 1.92* 1.80* 1.75*  CALCIUM   --  8.7* 8.7* 8.9 8.6* 8.6*  MG 1.7 1.8 1.8  --   --   --   PHOS  --  2.7  --   --   --   --     GFR: Estimated Creatinine Clearance: 31.1 mL/min (A) (by C-G formula based on SCr of 1.75 mg/dL (H)).  Liver Function Tests: Recent Labs  Lab 10/06/23 1908 10/07/23 0405 10/08/23 0529  AST 16 22 12*  ALT 12 14 11   ALKPHOS 93 87 89  BILITOT 0.9 1.0 0.4  PROT 7.1 6.0* 6.1*  ALBUMIN  3.5 3.0* 2.9*   No results for input(s): LIPASE, AMYLASE in the last 168 hours. No results for input(s): AMMONIA in the last 168 hours.  Coagulation Profile: Recent Labs  Lab 10/06/23 1908  INR 1.0    Cardiac Enzymes: No results for input(s): CKTOTAL, CKMB, CKMBINDEX, TROPONINI in the last 168 hours.  BNP (last 3 results) No results for input(s): PROBNP in the last 8760 hours.  Lipid Profile: No results for input(s): CHOL, HDL, LDLCALC, TRIG, CHOLHDL, LDLDIRECT in the last 72 hours.  Thyroid  Function Tests: No results for input(s):  TSH, T4TOTAL, FREET4, T3FREE, THYROIDAB in the last 72 hours.   Anemia Panel: No results for input(s): VITAMINB12, FOLATE, FERRITIN, TIBC, IRON, RETICCTPCT in the last 72 hours.  Urine analysis:    Component Value Date/Time   COLORURINE YELLOW 10/08/2023 1257   APPEARANCEUR CLEAR 10/08/2023 1257   LABSPEC 1.006 10/08/2023 1257   PHURINE 6.0 10/08/2023 1257   GLUCOSEU >=500 (A) 10/08/2023 1257   GLUCOSEU >=1000 (A) 05/15/2022 1554   HGBUR NEGATIVE 10/08/2023 1257   BILIRUBINUR NEGATIVE 10/08/2023 1257   BILIRUBINUR negative 09/25/2022 1027   KETONESUR NEGATIVE 10/08/2023 1257   PROTEINUR NEGATIVE 10/08/2023 1257   UROBILINOGEN 0.2 09/25/2022 1027   UROBILINOGEN 0.2 05/15/2022 1554   NITRITE NEGATIVE 10/08/2023 1257   LEUKOCYTESUR NEGATIVE 10/08/2023 1257    Sepsis Labs: Lactic Acid, Venous    Component Value Date/Time   LATICACIDVEN 1.5 10/06/2023 1939    MICROBIOLOGY: Recent Results (from the past 240 hours)  Culture, blood (Routine x 2)     Status: None   Collection Time: 10/06/23  7:08 PM   Specimen: BLOOD  Result Value Ref Range Status   Specimen Description BLOOD RIGHT ANTECUBITAL  Final   Special Requests AEROBIC BOTTLE ONLY Blood Culture adequate volume  Final   Culture   Final    NO GROWTH 5 DAYS Performed at [facility], [address] [place], [name] [address]    Report Status 10/11/2023 FINAL  Final  Culture, blood (Routine x 2)     Status: None   Collection Time: 10/06/23  7:32 PM   Specimen: BLOOD  Result Value Ref Range Status   Specimen Description  BLOOD BLOOD LEFT ARM  Final   Special Requests   Final    BOTTLES DRAWN AEROBIC AND ANAEROBIC Blood Culture results may not be optimal due to an inadequate volume of blood received in culture bottles   Culture   Final    NO GROWTH 5 DAYS Performed at [facility], [address], [place], [name] [address]    Report Status 10/11/2023 FINAL  Final  Surgical pcr  screen     Status: None   Collection Time: 10/09/23  9:54 AM   Specimen: Nasal Mucosa; Nasal Swab  Result Value Ref Range Status   MRSA, PCR NEGATIVE NEGATIVE Final   Staphylococcus aureus NEGATIVE NEGATIVE Final    Comment: (NOTE) The Xpert SA Assay (FDA approved for NASAL specimens in patients 93 years of age and older), is one component of a comprehensive surveillance program. It is not intended to diagnose infection nor to guide or monitor treatment. Performed at [facility], [address] [place], [name] [address]     RADIOLOGY STUDIES/RESULTS: No results found.   LOS: 5 days   Donalda Applebaum, MD  [facility]    To contact the attending provider between 7A-7P or the covering provider during after hours 7P-7A, please log into the web site [email or web address] and access using universal [place] password for that web site. If you do not have the password, please call the hospital operator.  10/11/2023, 11:43 AM

## 2023-10-11 NOTE — Plan of Care (Signed)
  Problem: Education: Goal: Ability to describe self-care measures that may prevent or decrease complications (Diabetes Survival Skills Education) will improve Outcome: Progressing   Problem: Coping: Goal: Ability to adjust to condition or change in health will improve Outcome: Progressing   Problem: Metabolic: Goal: Ability to maintain appropriate glucose levels will improve Outcome: Progressing   Problem: Nutritional: Goal: Maintenance of adequate nutrition will improve Outcome: Progressing   Problem: Skin Integrity: Goal: Risk for impaired skin integrity will decrease Outcome: Progressing   Problem: Tissue Perfusion: Goal: Adequacy of tissue perfusion will improve Outcome: Progressing   Problem: Education: Goal: Knowledge of General Education information will improve Description: Including pain rating scale, medication(s)/side effects and non-pharmacologic comfort measures Outcome: Progressing   Problem: Health Behavior/Discharge Planning: Goal: Ability to manage health-related needs will improve Outcome: Progressing   Problem: Clinical Measurements: Goal: Ability to maintain clinical measurements within normal limits will improve Outcome: Progressing Goal: Diagnostic test results will improve Outcome: Progressing Goal: Respiratory complications will improve Outcome: Progressing Goal: Cardiovascular complication will be avoided Outcome: Progressing   Problem: Activity: Goal: Risk for activity intolerance will decrease Outcome: Progressing   Problem: Nutrition: Goal: Adequate nutrition will be maintained Outcome: Progressing   Problem: Elimination: Goal: Will not experience complications related to bowel motility Outcome: Progressing Goal: Will not experience complications related to urinary retention Outcome: Progressing   Problem: Pain Managment: Goal: General experience of comfort will improve and/or be controlled Outcome: Progressing   Problem:  Safety: Goal: Ability to remain free from injury will improve Outcome: Progressing   Problem: Skin Integrity: Goal: Risk for impaired skin integrity will decrease Outcome: Progressing

## 2023-10-11 NOTE — Progress Notes (Signed)
   10/11/23 1218  Mobility  Activity Ambulated with assistance  Level of Assistance Standby assist, set-up cues, supervision of patient - no hands on  Assistive Device Front wheel walker  Distance Ambulated (ft) 200 ft  Activity Response Tolerated fair  Mobility Referral Yes  Mobility visit 1 Mobility  Mobility Specialist Start Time (ACUTE ONLY) 1218  Mobility Specialist Stop Time (ACUTE ONLY) 1300  Mobility Specialist Time Calculation (min) (ACUTE ONLY) 42 min   Mobility Specialist: Progress Note  During Mobility: SpO2 93-94% 4L Post-Mobility:    SpO2 95% 4.5L HR 66  Pt agreeable to mobility session - received in bed . C/o LLE pain rated 5/10. Returned to bed with all needs met - call bell within reach.   Virgle Boards, BS Mobility Specialist Please contact via SecureChat or  Rehab office at [phone].

## 2023-10-11 NOTE — Progress Notes (Signed)
 Patient ID: ANGELIS GATES, female   DOB: 28-Jan-1946, 78 y.o.   MRN: [id number] Left LE dressing C/D Vac without drainage, good seal to fit  Lungs non labored   S/P  PROCEDURE: Excision of skin soft tissue muscle and fascia and necrotic tissue left calf. Tissue sent for cultures. Local tissue transfer for wound closure 10 x 4 cm. Application of peel in place wound VAC sponge.   Cultures negative to date Plan for Prevena vac at discharge.  Vac will stay for 1 week until f/u visit Cam walking boot WBAT F/U in office in a week  Rocky JONELLE Hans, NP

## 2023-10-11 NOTE — Plan of Care (Signed)
 Pt has rested quietly throughout the night with no distress noted. Alert and oriented. ON O24LNC. SR on the monitor. Up to [employer] to void. Medicated for pain once with relief noted. No other complaints voiced.     Problem: Health Behavior/Discharge Planning: Goal: Ability to manage health-related needs will improve Outcome: Progressing   Problem: Tissue Perfusion: Goal: Adequacy of tissue perfusion will improve Outcome: Progressing   Problem: Education: Goal: Knowledge of General Education information will improve Description: Including pain rating scale, medication(s)/side effects and non-pharmacologic comfort measures Outcome: Progressing   Problem: Clinical Measurements: Goal: Respiratory complications will improve Outcome: Progressing Goal: Cardiovascular complication will be avoided Outcome: Progressing   Problem: Coping: Goal: Level of anxiety will decrease Outcome: Progressing   Problem: Pain Managment: Goal: General experience of comfort will improve and/or be controlled Outcome: Progressing

## 2023-10-11 NOTE — Evaluation (Signed)
 Occupational Therapy Evaluation and Discharge Patient Details Name: Kerry Kerry MRN: [id number] DOB: [birth year] Today's Date: 10/11/2023   History of Present Illness   Patient is a 78 y.o.  female who presented to [facility] on 8/10-with LLE pain/erythema/purulent discharge with associated fevers.  She was found to have sepsis secondary to LLE cellulitis with abscess. Pt is s/p I&D on 8/13. history of DM-2, CKD stage IIIb, chronic HFpEF, COPD.     Clinical Impressions This 78 yo female admitted and underwent above presents to acute OT with PLOF of being independent with basic ADLs and IADLs including driving. Currently she is Mod I to Independent with basic ADLs with son that can help with IADLs as well as help take care of his dad (her husband). No further OT needs identified, we will sign off.     If plan is discharge home, recommend the following:   Assistance with cooking/housework;Assist for transportation      Equipment Recommendations   None recommended by OT      Precautions/Restrictions   Precautions Precautions: Fall Recall of Precautions/Restrictions: Intact Precaution/Restrictions Comments: baseline she states she sometimes wears O2 if she is doing something that takes alot of energy (ie: making up bed) but otherwise she does not use O2 because she refuses to wear the O2 that is hooked to the big concentrator or large O2 tanks (can't manage in her house) Restrictions Weight Bearing Restrictions Per Provider Order: No     Mobility Bed Mobility Overal bed mobility: Modified Independent                  Transfers Overall transfer level: Modified independent   Transfers: Bed to chair/wheelchair/BSC       Step pivot transfers: Modified independent (Device/Increase time)            Balance Overall balance assessment: Mild deficits observed, not formally tested                                         ADL either performed or  assessed with clinical judgement   ADL                                         General ADL Comments: Pt can tranfers bed<>BSC Mod I (managing her O2 tubing and her wound vac tubing), reports she does well with RW and ambulating with cam boot, will not wear O2 at home unless she can get one of those compact O2 machines. She is aware that she cannot take a shower or bath with current wound vac on her left foot. She plans on wearing gowns and house dresses to make it simple at home. She is asking about the small wound vac to take home with her--made RN aware she is asking about this. She has no other ADL concerns     Vision Patient Visual Report: No change from baseline              Pertinent Vitals/Pain Pain Assessment Pain Assessment: No/denies pain     Extremity/Trunk Assessment Upper Extremity Assessment Upper Extremity Assessment: Overall WFL for tasks assessed           Communication Communication Communication: No apparent difficulties   Cognition Arousal: Alert Behavior During Therapy: [facility] for tasks assessed/performed  Following commands: Intact       Cueing   Cueing Techniques: Verbal cues;Tactile cues              Home Living Family/patient expects to be discharged to:: Private residence Living Arrangements: Spouse/significant other;Children Available Help at Discharge: Family;Available 24 hours/day Type of Home: House Home Access: Stairs to enter;Ramped entrance Entrance Stairs-Number of Steps: 3   Home Layout: Two level;Able to live on main level with bedroom/bathroom     Bathroom Shower/Tub: Walk-in shower;Door   Bathroom Toilet: Handicapped height     Home Equipment: Grab bars - tub/shower;Shower Counsellor (2 wheels);Cane - single point;Rollator (4 wheels);Hand held shower head;Wheelchair - manual          Prior Functioning/Environment Prior Level of Function :  Independent/Modified Independent;Driving             Mobility Comments: Pt reports intermittent use of RW ADLs Comments: Pt is independent but 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 At baseline she assists him with his ADL's. Son is helping with ADLs and tranportation as of recent    OT Problem List: Impaired balance (sitting and/or standing)        OT Goals(Current goals can be found in the care plan section)   Acute Rehab OT Goals Patient Stated Goal: to go home today         AM-PAC OT 6 Clicks Daily Activity     Outcome Measure Help from another person eating meals?: None Help from another person taking care of personal grooming?: None Help from another person toileting, which includes using toliet, bedpan, or urinal?: None Help from another person bathing (including washing, rinsing, drying)?: None Help from another person to put on and taking off regular upper body clothing?: None Help from another person to put on and taking off regular lower body clothing?: None 6 Click Score: 24   End of Session Equipment Utilized During Treatment:  (cam boot, O2, wound vac)  Activity Tolerance: Patient tolerated treatment well Patient left: in bed;with call bell/phone within reach;with bed alarm set  OT Visit Diagnosis: Other abnormalities of gait and mobility (R26.89)                Time: 8591-8576 OT Time Calculation (min): 15 min Charges:  OT General Charges $OT Visit: 1 Visit OT Evaluation $OT Eval Low Complexity: 1 Low  Kerry Kerry OT Acute Rehabilitation Services Office [phone]    Kerry Kerry 10/11/2023, 4:07 PM

## 2023-10-12 DIAGNOSIS — L03116 Cellulitis of left lower limb: Secondary | ICD-10-CM | POA: Diagnosis not present

## 2023-10-12 DIAGNOSIS — J9601 Acute respiratory failure with hypoxia: Secondary | ICD-10-CM | POA: Diagnosis not present

## 2023-10-12 DIAGNOSIS — N1832 Chronic kidney disease, stage 3b: Secondary | ICD-10-CM | POA: Diagnosis not present

## 2023-10-12 DIAGNOSIS — L02416 Cutaneous abscess of left lower limb: Secondary | ICD-10-CM | POA: Diagnosis not present

## 2023-10-12 LAB — BASIC METABOLIC PANEL WITH GFR
Anion gap: 11 (ref 5–15)
BUN: 35 mg/dL — ABNORMAL HIGH (ref 8–23)
CO2: 26 mmol/L (ref 22–32)
Calcium: 8.7 mg/dL — ABNORMAL LOW (ref 8.9–10.3)
Chloride: 100 mmol/L (ref 98–111)
Creatinine, Ser: 2.1 mg/dL — ABNORMAL HIGH (ref 0.44–1.00)
GFR, Estimated: 24 mL/min — ABNORMAL LOW (ref >60)
Glucose, Bld: 107 mg/dL — ABNORMAL HIGH (ref 70–99)
Potassium: 4.2 mmol/L (ref 3.5–5.1)
Sodium: 137 mmol/L (ref 135–145)

## 2023-10-12 LAB — GLUCOSE, CAPILLARY
Glucose-Capillary: 114 mg/dL — ABNORMAL HIGH (ref 70–99)
Glucose-Capillary: 180 mg/dL — ABNORMAL HIGH (ref 70–99)
Glucose-Capillary: 158 mg/dL — ABNORMAL HIGH (ref 70–99)
Glucose-Capillary: 152 mg/dL — ABNORMAL HIGH (ref 70–99)

## 2023-10-12 MED ORDER — FUROSEMIDE 40 MG PO TABS
40.0000 mg | ORAL_TABLET | Freq: Every day | ORAL | Status: DC
  Administered 2023-10-12: 40 mg via ORAL
  Filled 2023-10-12: qty 1

## 2023-10-12 MED ORDER — BUDESONIDE 0.25 MG/2ML IN SUSP
0.2500 mg | Freq: Two times a day (BID) | RESPIRATORY_TRACT | Status: DC
  Administered 2023-10-12 – 2023-10-13 (×3): 0.25 mg via RESPIRATORY_TRACT
  Filled 2023-10-12 (×3): qty 2

## 2023-10-12 MED ORDER — REVEFENACIN 175 MCG/3ML IN SOLN
175.0000 ug | Freq: Every day | RESPIRATORY_TRACT | Status: DC
  Administered 2023-10-12 – 2023-10-13 (×2): 175 ug via RESPIRATORY_TRACT
  Filled 2023-10-12 (×2): qty 3

## 2023-10-12 MED ORDER — DAPAGLIFLOZIN PROPANEDIOL 10 MG PO TABS
10.0000 mg | ORAL_TABLET | Freq: Every day | ORAL | Status: DC
  Administered 2023-10-12 – 2023-10-13 (×2): 10 mg via ORAL
  Filled 2023-10-12 (×2): qty 1

## 2023-10-12 MED ORDER — ARFORMOTEROL TARTRATE 15 MCG/2ML IN NEBU
15.0000 ug | INHALATION_SOLUTION | Freq: Two times a day (BID) | RESPIRATORY_TRACT | Status: DC
  Administered 2023-10-12 – 2023-10-13 (×3): 15 ug via RESPIRATORY_TRACT
  Filled 2023-10-12 (×3): qty 2

## 2023-10-12 MED ORDER — FUROSEMIDE 20 MG PO TABS
20.0000 mg | ORAL_TABLET | Freq: Every day | ORAL | Status: DC
  Filled 2023-10-12: qty 1

## 2023-10-12 NOTE — Plan of Care (Signed)

## 2023-10-12 NOTE — Progress Notes (Signed)
 Mobility Specialist: Progress Note   10/12/23 1500  Mobility  Activity Ambulated with assistance  Level of Assistance Standby assist, set-up cues, supervision of patient - no hands on  Assistive Device Front wheel walker  Distance Ambulated (ft) 200 ft  Activity Response Tolerated well  Mobility Referral Yes  Mobility visit 1 Mobility  Mobility Specialist Start Time (ACUTE ONLY) 1223  Mobility Specialist Stop Time (ACUTE ONLY) 1237  Mobility Specialist Time Calculation (min) (ACUTE ONLY) 14 min    Pt received in bed, agreeable to mobility session. SV throughout. No complaints. Returned to room without fault. Left on EOB with all needs met, call bell in reach.   Ileana Lute Mobility Specialist Please contact via SecureChat or Rehab office at [phone]

## 2023-10-12 NOTE — Progress Notes (Addendum)
 PROGRESS NOTE        PATIENT DETAILS Name: Kerry Powell Age: 78 y.o. Sex: female Date of Birth: January 23, 1946 Admit Date: 10/06/2023 Admitting Physician Eva KATHEE Pore, DO ERE:Almwd, Glade PARAS, MD  Brief Summary: Patient is a 78 y.o.  female with history of DM-2, CKD stage IIIb, chronic HFpEF, COPD-who presented to [facility] on 8/10-with LLE pain/erythema/purulent discharge with associated fevers.  She was found to have sepsis secondary to LLE cellulitis with abscess.  Significant events: 8/10>> admit to [facility]  Significant studies: 8/11>> MRI tibia/fibula left: Focal collection along the anterior compartment-could reflect an abscess.  Significant microbiology data: 8/10>> blood culture: No growth  Procedures: 8/13>> excision of soft tissue muscle/fascia/necrotic tissue left calf.  Application of wound VAC sponge.  Consults: Orthopedics  Subjective: Continues to have issues with some pain at the operative site-continues to have issues with hypoxemia when off O2.  Per patient-she apparently bought oxygen from [place] and uses it when she gets short of breath with exertion.  Objective: Vitals: Blood pressure 106/62, pulse 66, temperature 98.9 F (37.2 C), temperature source Oral, resp. rate 20, height (P) 5' 5 (1.651 m), weight 105.3 kg, SpO2 91%.   Exam: Awake/alert Chest: Clear to auscultation CVS: S1-S2 regular Abdomen: Soft nontender   Pertinent Labs/Radiology:    Latest Ref Rng & Units 10/10/2023    5:18 AM 10/09/2023    4:15 AM 10/08/2023    5:29 AM  CBC  WBC 4.0 - 10.5 K/uL 12.5  11.1  14.7   Hemoglobin 12.0 - 15.0 g/dL 86.6  86.0  86.0   Hematocrit 36.0 - 46.0 % 41.6  43.1  42.9   Platelets 150 - 400 K/uL 288  237  230     Lab Results  Component Value Date   NA 137 10/12/2023   K 4.2 10/12/2023   CL 100 10/12/2023   CO2 26 10/12/2023      Assessment/Plan: Severe sepsis secondary to LLE purulent cellulitis with abscess. Sepsis  physiology has resolved-leukocytosis is likely due to steroids. S/p incision/debridement by Dr. Harden on 8/13 Spoke with lab-cultures were sent to pathology instead of microbiology. On Zyvox  x 3 weeks antibiotics recommended by Dr. Harden. Appreciate orthopedic input-Prevena VAC plan for discharge, WBAT as tolerated.  Acute hypoxic respiratory failure secondary to COPD exacerbation Overall improved Stop prednisone  Start maintenance bronchodilators Continue to attempt to slowly taper down FiO2 Will likely require home O2 on discharge (per patient-she bought some oxygen tanks of [employer]/some other website-and apparently uses it when she gets short of breath with exertion)  HTN BP stable Metoprolol   DM-2 (A1c 6.4 on 8/11) mild hyperglycemia due to steroids. SSI Follow/optimize.  Recent Labs    10/11/23 1629 10/11/23 2132 10/12/23 0802  GLUCAP 182* 209* 114*     HLD Statin  Chronic HFpEF Euvolemic on exam Resume oral Lasix -try to maintain negative balance.  HTN BP stable without use of any antihypertensives Norvasc /ARB remain on hold-resume when able  PAD Stable Plavix /Crestor   CKD stage IIIb At baseline Monitor electrolytes Avoid nephrotoxic agents.  Class I obesity: Estimated body mass index is 38.63 kg/m (pended) as calculated from the following:   Height as of this encounter: (P) 5' 5 (1.651 m).   Weight as of this encounter: 105.3 kg.   Code status:   Code Status: Limited: Do not attempt resuscitation (DNR) -DNR-LIMITED -  Do Not Intubate/DNI    DVT Prophylaxis: heparin  injection 5,000 Units Start: 10/06/23 2230   Family Communication: None at bedside   Disposition Plan: Status is: Inpatient Remains inpatient appropriate because: Severity of illness   Planned Discharge Destination:Home   Diet: Diet Order             Diet heart healthy/carb modified Fluid consistency: Thin  Diet effective now                     Antimicrobial  agents: Anti-infectives (From admission, onward)    Start     Dose/Rate Route Frequency Ordered Stop   10/10/23 1200  vancomycin  (VANCOCIN ) 1,000 mg in sodium chloride  0.9 % 250 mL IVPB  Status:  Discontinued        1,000 mg 250 mL/hr over 60 Minutes Intravenous Every 48 hours 10/10/23 1049 10/10/23 1054   10/10/23 1145  vancomycin  (VANCOCIN ) 1,000 mg in sodium chloride  0.9 % 250 mL IVPB  Status:  Discontinued        1,000 mg 250 mL/hr over 60 Minutes Intravenous Every 48 hours 10/10/23 1049 10/10/23 1049   10/10/23 1145  linezolid  (ZYVOX ) tablet 600 mg        600 mg Oral Every 12 hours 10/10/23 1055 10/31/23 0959   10/09/23 1100  ceFAZolin  (ANCEF ) IVPB 2g/100 mL premix  Status:  Discontinued        2 g 200 mL/hr over 30 Minutes Intravenous On call to O.R. 10/09/23 0738 10/09/23 1706   10/08/23 2200  vancomycin  (VANCOCIN ) 1,000 mg in sodium chloride  0.9 % 250 mL IVPB  Status:  Discontinued        1,000 mg 250 mL/hr over 60 Minutes Intravenous Every 48 hours 10/07/23 0208 10/10/23 1045   10/07/23 1500  cefTRIAXone  (ROCEPHIN ) 1 g in sodium chloride  0.9 % 100 mL IVPB  Status:  Discontinued        1 g 200 mL/hr over 30 Minutes Intravenous Every 24 hours 10/06/23 2229 10/10/23 1045   10/06/23 2015  cefTRIAXone  (ROCEPHIN ) 2 g in sodium chloride  0.9 % 100 mL IVPB        2 g 200 mL/hr over 30 Minutes Intravenous Once 10/06/23 2004 10/06/23 2120   10/06/23 2015  vancomycin  (VANCOCIN ) IVPB 1000 mg/200 mL premix        1,000 mg 200 mL/hr over 60 Minutes Intravenous  Once 10/06/23 2004 10/06/23 2244        MEDICATIONS: Scheduled Meds:  arformoterol   15 mcg Nebulization BID   budesonide  (PULMICORT ) nebulizer solution  0.25 mg Nebulization BID   clopidogrel   75 mg Oral Daily   DULoxetine   60 mg Oral Daily   ferrous sulfate   325 mg Oral QODAY   fluticasone   1 spray Each Nare Daily   furosemide   40 mg Oral Daily   heparin   5,000 Units Subcutaneous Q8H   insulin  aspart  0-5 Units  Subcutaneous QHS   insulin  aspart  0-9 Units Subcutaneous TID WC   linezolid   600 mg Oral Q12H   loratadine   10 mg Oral Daily   metoprolol  tartrate  25 mg Oral BID   montelukast   10 mg Oral QHS   pantoprazole   40 mg Oral Daily   polyethylene glycol  17 g Oral BID   predniSONE   20 mg Oral Q breakfast   revefenacin   175 mcg Nebulization Daily   rosuvastatin   40 mg Oral Daily   Continuous Infusions:   PRN Meds:.acetaminophen  **OR** acetaminophen , albuterol ,  artificial tears, bisacodyl , cyclobenzaprine , fentaNYL  (SUBLIMAZE ) injection, melatonin, naLOXone  (NARCAN )  injection, ondansetron  (ZOFRAN ) IV, oxyCODONE , sodium chloride    I have personally reviewed following labs and imaging studies  LABORATORY DATA: CBC: Recent Labs  Lab 10/06/23 1908 10/07/23 0405 10/08/23 0529 10/09/23 0415 10/10/23 0518  WBC 20.6* 20.9* 14.7* 11.1* 12.5*  NEUTROABS 16.6* 17.0*  --   --   --   HGB 15.9* 14.3 13.9 13.9 13.3  HCT 49.6* 44.4 42.9 43.1 41.6  MCV 97.1 97.2 96.4 96.6 97.2  PLT 287 232 230 237 288    Basic Metabolic Panel: Recent Labs  Lab 10/06/23 1922 10/07/23 0405 10/08/23 0529 10/09/23 0415 10/10/23 0518 10/11/23 0538 10/12/23 0657  NA  --  132* 135 137 133* 137 137  K  --  4.3 4.4 4.6 4.6 4.3 4.2  CL  --  99 101 105 102 103 100  CO2  --  23 24 21* 22 24 26   GLUCOSE  --  120* 107* 182* 171* 116* 107*  BUN  --  21 20 21  25* 33* 35*  CREATININE  --  1.97* 2.15* 1.92* 1.80* 1.75* 2.10*  CALCIUM   --  8.7* 8.7* 8.9 8.6* 8.6* 8.7*  MG 1.7 1.8 1.8  --   --   --   --   PHOS  --  2.7  --   --   --   --   --     GFR: Estimated Creatinine Clearance: 26.6 mL/min (A) (by C-G formula based on SCr of 2.1 mg/dL (H)).  Liver Function Tests: Recent Labs  Lab 10/06/23 1908 10/07/23 0405 10/08/23 0529  AST 16 22 12*  ALT 12 14 11   ALKPHOS 93 87 89  BILITOT 0.9 1.0 0.4  PROT 7.1 6.0* 6.1*  ALBUMIN 3.5 3.0* 2.9*   No results for input(s): LIPASE, AMYLASE in the last 168  hours. No results for input(s): AMMONIA in the last 168 hours.  Coagulation Profile: Recent Labs  Lab 10/06/23 1908  INR 1.0    Cardiac Enzymes: No results for input(s): CKTOTAL, CKMB, CKMBINDEX, TROPONINI in the last 168 hours.  BNP (last 3 results) No results for input(s): PROBNP in the last 8760 hours.  Lipid Profile: No results for input(s): CHOL, HDL, LDLCALC, TRIG, CHOLHDL, LDLDIRECT in the last 72 hours.  Thyroid  Function Tests: No results for input(s): TSH, T4TOTAL, FREET4, T3FREE, THYROIDAB in the last 72 hours.   Anemia Panel: No results for input(s): VITAMINB12, FOLATE, FERRITIN, TIBC, IRON, RETICCTPCT in the last 72 hours.  Urine analysis:    Component Value Date/Time   COLORURINE YELLOW 10/08/2023 1257   APPEARANCEUR CLEAR 10/08/2023 1257   LABSPEC 1.006 10/08/2023 1257   PHURINE 6.0 10/08/2023 1257   GLUCOSEU >=500 (A) 10/08/2023 1257   GLUCOSEU >=1000 (A) 05/15/2022 1554   HGBUR NEGATIVE 10/08/2023 1257   BILIRUBINUR NEGATIVE 10/08/2023 1257   BILIRUBINUR negative 09/25/2022 1027   KETONESUR NEGATIVE 10/08/2023 1257   PROTEINUR NEGATIVE 10/08/2023 1257   UROBILINOGEN 0.2 09/25/2022 1027   UROBILINOGEN 0.2 05/15/2022 1554   NITRITE NEGATIVE 10/08/2023 1257   LEUKOCYTESUR NEGATIVE 10/08/2023 1257    Sepsis Labs: Lactic Acid, Venous    Component Value Date/Time   LATICACIDVEN 1.5 10/06/2023 1939    MICROBIOLOGY: Recent Results (from the past 240 hours)  Culture, blood (Routine x 2)     Status: None   Collection Time: 10/06/23  7:08 PM   Specimen: BLOOD  Result Value Ref Range Status   Specimen Description BLOOD  RIGHT ANTECUBITAL  Final   Special Requests AEROBIC BOTTLE ONLY Blood Culture adequate volume  Final   Culture   Final    NO GROWTH 5 DAYS Performed at [facility], [address], [place], [name] [address]    Report Status 10/11/2023 FINAL  Final  Culture, blood (Routine x 2)      Status: None   Collection Time: 10/06/23  7:32 PM   Specimen: BLOOD  Result Value Ref Range Status   Specimen Description BLOOD BLOOD LEFT ARM  Final   Special Requests   Final    BOTTLES DRAWN AEROBIC AND ANAEROBIC Blood Culture results may not be optimal due to an inadequate volume of blood received in culture bottles   Culture   Final    NO GROWTH 5 DAYS Performed at [facility], [address], [place], [name] [address]    Report Status 10/11/2023 FINAL  Final  Surgical pcr screen     Status: None   Collection Time: 10/09/23  9:54 AM   Specimen: Nasal Mucosa; Nasal Swab  Result Value Ref Range Status   MRSA, PCR NEGATIVE NEGATIVE Final   Staphylococcus aureus NEGATIVE NEGATIVE Final    Comment: (NOTE) The Xpert SA Assay (FDA approved for NASAL specimens in patients 27 years of age and older), is one component of a comprehensive surveillance program. It is not intended to diagnose infection nor to guide or monitor treatment. Performed at [facility], [address] [place], [name] [address]     RADIOLOGY STUDIES/RESULTS: No results found.   LOS: 6 days   Donalda Applebaum, MD  [facility]    To contact the attending provider between 7A-7P or the covering provider during after hours 7P-7A, please log into the web site [email or web address] and access using universal [place] password for that web site. If you do not have the password, please call the hospital operator.  10/12/2023, 10:08 AM

## 2023-10-12 NOTE — Plan of Care (Signed)
 Pt has rested quietly throughout the night with no distress noted. Alert and oriented. ON room air. SR on the monitor. Up to [facility] to void. Wound vac intact and on. Dressing to leg CDI. Medicated for pain once with relief noted. Boot off for bed. No other complaints voiced.     Problem: Tissue Perfusion: Goal: Adequacy of tissue perfusion will improve Outcome: Progressing   Problem: Education: Goal: Knowledge of General Education information will improve Description: Including pain rating scale, medication(s)/side effects and non-pharmacologic comfort measures Outcome: Progressing   Problem: Health Behavior/Discharge Planning: Goal: Ability to manage health-related needs will improve Outcome: Progressing   Problem: Clinical Measurements: Goal: Respiratory complications will improve Outcome: Progressing Goal: Cardiovascular complication will be avoided Outcome: Progressing   Problem: Pain Managment: Goal: General experience of comfort will improve and/or be controlled Outcome: Progressing

## 2023-10-13 DIAGNOSIS — L02416 Cutaneous abscess of left lower limb: Secondary | ICD-10-CM | POA: Diagnosis not present

## 2023-10-13 DIAGNOSIS — L03116 Cellulitis of left lower limb: Secondary | ICD-10-CM | POA: Diagnosis not present

## 2023-10-13 DIAGNOSIS — J9601 Acute respiratory failure with hypoxia: Secondary | ICD-10-CM | POA: Diagnosis not present

## 2023-10-13 DIAGNOSIS — E871 Hypo-osmolality and hyponatremia: Secondary | ICD-10-CM | POA: Diagnosis not present

## 2023-10-13 LAB — GLUCOSE, CAPILLARY
Glucose-Capillary: 77 mg/dL (ref 70–99)
Glucose-Capillary: 102 mg/dL — ABNORMAL HIGH (ref 70–99)

## 2023-10-13 MED ORDER — OXYCODONE HCL 5 MG PO TABS: 5.0000 mg | ORAL_TABLET | Freq: Four times a day (QID) | ORAL | 0 refills | Status: DC | PRN

## 2023-10-13 MED ORDER — BUDESONIDE-FORMOTEROL FUMARATE 160-4.5 MCG/ACT IN AERO: 2.0000 | INHALATION_SPRAY | Freq: Two times a day (BID) | RESPIRATORY_TRACT | 12 refills | Status: DC

## 2023-10-13 MED ORDER — TIOTROPIUM BROMIDE MONOHYDRATE 18 MCG IN CAPS: 18.0000 ug | ORAL_CAPSULE | Freq: Every day | RESPIRATORY_TRACT | 2 refills | Status: DC

## 2023-10-13 MED ORDER — DOXYCYCLINE HYCLATE 100 MG PO TABS: 100.0000 mg | ORAL_TABLET | Freq: Two times a day (BID) | ORAL | 0 refills | Status: DC

## 2023-10-13 NOTE — TOC Transition Note (Addendum)
 Transition of Care [facility]) - Discharge Note   Patient Details  Name: Kerry Powell MRN: [id number] Date of Birth: [birth year]  Transition of [facility]) CM/SW Contact:  Marval Gell, RN Phone Number: 10/13/2023, 9:19 AM   Clinical Narrative:     Spoke w patient in the room. She would like to go home and have [facility] services with [facility], whom she has had in the past. Liaison accepted. She will need home oxygen, set up with [employer] who will deliver set up to her room.   Update [facility] cannot accept, [facility] able to, added to AVS  Final next level of care: Home w Home Health Services Barriers to Discharge: No Barriers Identified   Patient Goals and CMS Choice Patient states their goals for this hospitalization and ongoing recovery are:: to go home CMS Medicare.gov Compare Post Acute Care list provided to:: Patient Choice offered to / list presented to : Patient      Discharge Placement                       Discharge Plan and Services Additional resources added to the After Visit Summary for                            [facility] Arranged: PT [facility] Agency: [facility] Date [facility] Agency Contacted: 10/13/23 Time HH Agency Contacted: [id number] Representative spoke with at [facility] Agency: Amy  Social Drivers of Health (SDOH) Interventions SDOH Screenings   Food Insecurity: No Food Insecurity (10/07/2023)  Housing: Low Risk  (10/07/2023)  Transportation Needs: No Transportation Needs (10/07/2023)  Utilities: Not At Risk (10/07/2023)  Alcohol  Screen: Low Risk  (05/15/2023)  Depression (PHQ2-9): Medium Risk (07/24/2023)  Financial Resource Strain: Low Risk  (06/04/2023)  Physical Activity: Inactive (06/04/2023)  Social Connections: Socially Isolated (10/07/2023)  Stress: Stress Concern Present (06/04/2023)  Tobacco Use: Medium Risk (10/09/2023)  Health Literacy: Adequate Health Literacy (05/15/2023)     Readmission Risk Interventions     No data to display

## 2023-10-13 NOTE — Progress Notes (Signed)
 DC order noted per MD. DC RN at bedside. Primary RN to pickup provena from PACU. [employer] O2 delivered to patient room. AVS printed/reviewed with patient. Instructed on importance of compliance with med regimen, and abx to prevent infection. HH coordinated by CM. Provena dressing change and woudn vac exchange completed by Engineer, building services. PIVs removed, skin intact except for wounds, see LDA for more details. All belongings accounted for. Patient family on the way. Patient wheeled downstairs for discharge with [employer] O2 tank.

## 2023-10-13 NOTE — Discharge Summary (Signed)
 PATIENT DETAILS Name: Kerry Powell Age: 78 y.o. Sex: female Date of Birth: [birth year] MRN: [id number]. Admitting Physician: Eva KATHEE Pore, DO ERE:Almwd, Glade PARAS, MD  Admit Date: 10/06/2023 Discharge date: 10/13/2023  Recommendations for Outpatient Follow-up:  Follow up with PCP in 1-2 weeks Please obtain CMP/CBC in one week Please ensure follow-up with orthopedics Please ensure follow-up with pulmonology-being discharged on home O2  Admitted From:  Home  Disposition: Home health   Discharge Condition: good  CODE STATUS:   Code Status: Limited: Do not attempt resuscitation (DNR) -DNR-LIMITED -Do Not Intubate/DNI    Diet recommendation:  Diet Order             Diet - low sodium heart healthy           Diet Carb Modified           Diet heart healthy/carb modified Fluid consistency: Thin  Diet effective now                    Brief Summary: Patient is a 78 y.o.  female with history of DM-2, CKD stage IIIb, chronic HFpEF, COPD-who presented to [facility] on 8/10-with LLE pain/erythema/purulent discharge with associated fevers.  She was found to have sepsis secondary to LLE cellulitis with abscess.   Significant events: 8/10>> admit to [facility]   Significant studies: 8/11>> MRI tibia/fibula left: Focal collection along the anterior compartment-could reflect an abscess.   Significant microbiology data: 8/10>> blood culture: No growth   Procedures: 8/13>> excision of soft tissue muscle/fascia/necrotic tissue left calf.  Application of wound VAC sponge.   Consults: Orthopedics  Brief Hospital Course: Severe sepsis secondary to LLE purulent cellulitis with abscess. Sepsis physiology has resolved-leukocytosis is likely due to steroids. S/p incision/debridement by Dr. Harden on 8/13 Spoke with lab-cultures were sent to pathology instead of microbiology. Orthopedics recommending 3 weeks of antibiotics-will transition to doxycycline  on discharge. Appreciate orthopedic  input-Prevena VAC plan for discharge, WBAT as tolerated.   Acute hypoxic respiratory failure secondary to COPD exacerbation Treated with steroids/bronchodilators much improved She is apparently not on any maintenance inhalers which will be started on discharge-has been on Brovana /Yupelri /budesonide  nebs. Qualifies for home O2-which is being arranged prior to discharge. Follow-up with PCP or primary pulmonologist.   HTN BP stable Metoprolol    DM-2 (A1c 6.4 on 8/11) mild hyperglycemia due to steroids. CBG stable while inpatient on SSI Resume oral hypoglycemic agents on discharge  HLD Statin   Chronic HFpEF Euvolemic on exam As needed Lasix    HTN Resume Norvasc /ARB on discharge-as blood pressure slowly creeping up.   PAD Stable Plavix /Crestor    CKD stage IIIb At baseline Monitor electrolytes Avoid nephrotoxic agents.   Class I obesity: Estimated body mass index is 38.63 kg/m (pended) as calculated from the following:   Height as of this encounter: (P) 5' 5 (1.651 m).   Weight as of this encounter: 105.3 kg.   Discharge Diagnoses:  Principal Problem:   Cellulitis of left lower extremity Active Problems:   DM2 (diabetes mellitus, type 2) (HCC)   Acute hypoxic respiratory failure (HCC)   COPD with acute exacerbation (HCC)   CKD stage 3b, GFR 30-44 ml/min (HCC)   Chronic diastolic CHF (congestive heart failure) (HCC)   Severe sepsis (HCC)   Acute hyponatremia   Sterile pyuria   Abscess of left lower leg   Discharge Instructions:  Activity:  As tolerated with Full fall precautions use walker/cane & assistance as needed  Discharge Instructions  Call MD for:  redness, tenderness, or signs of infection (pain, swelling, redness, odor or green/yellow discharge around incision site)   Complete by: As directed    Call MD for:  temperature >100.4   Complete by: As directed    Diet - low sodium heart healthy   Complete by: As directed    Diet Carb Modified    Complete by: As directed    Discharge instructions   Complete by: As directed    Follow with Primary MD  Geofm Glade PARAS, MD in 1-2 weeks  Please get a complete blood count and chemistry panel checked by your Primary MD at your next visit, and again as instructed by your Primary MD.  Get Medicines reviewed and adjusted: Please take all your medications with you for your next visit with your Primary MD  Laboratory/radiological data: Please request your Primary MD to go over all hospital tests and procedure/radiological results at the follow up, please ask your Primary MD to get all Hospital records sent to his/her office.  In some cases, they will be blood work, cultures and biopsy results pending at the time of your discharge. Please request that your primary care M.D. follows up on these results.  Also Note the following: If you experience worsening of your admission symptoms, develop shortness of breath, life threatening emergency, suicidal or homicidal thoughts you must seek medical attention immediately by calling 911 or calling your MD immediately  if symptoms less severe.  You must read complete instructions/literature along with all the possible adverse reactions/side effects for all the Medicines you take and that have been prescribed to you. Take any new Medicines after you have completely understood and accpet all the possible adverse reactions/side effects.   Do not drive when taking Pain medications or sleeping medications (Benzodaizepines)  Do not take more than prescribed Pain, Sleep and Anxiety Medications. It is not advisable to combine anxiety,sleep and pain medications without talking with your primary care practitioner  Special Instructions: If you have smoked or chewed Tobacco  in the last 2 yrs please stop smoking, stop any regular Alcohol   and or any Recreational drug use.  Wear Seat belts while driving.  Please note: You were cared for by a hospitalist during your  hospital stay. Once you are discharged, your primary care physician will handle any further medical issues. Please note that NO REFILLS for any discharge medications will be authorized once you are discharged, as it is imperative that you return to your primary care physician (or establish a relationship with a primary care physician if you do not have one) for your post hospital discharge needs so that they can reassess your need for medications and monitor your lab values.   Discharge wound care:   Complete by: As directed    Vac will stay for 1 week until f/u visit with orthopedics Cam walking boot WBAT   Increase activity slowly   Complete by: As directed       Allergies as of 10/13/2023       Reactions   Gabapentin Other (See Comments)   Fluid retention    Clindamycin  Rash   Sulfa Antibiotics Nausea And Vomiting        Medication List     TAKE these medications    albuterol  (2.5 MG/3ML) 0.083% nebulizer solution Commonly known as: PROVENTIL  USE 1 VIAL IN NEBULIZER EVERY 6 HOURS AND AS NEEDED FOR SHORT BREATH/WHEEZE.  Generic:  Ventolin    allopurinol  300 MG tablet Commonly  known as: ZYLOPRIM  Take 1 tablet by mouth once daily   amLODipine  5 MG tablet Commonly known as: NORVASC  TAKE 1 & 1/2 (ONE & ONE-HALF) TABLETS BY MOUTH ONCE DAILY What changed:  how much to take how to take this when to take this   budesonide -formoterol  160-4.5 MCG/ACT inhaler Commonly known as: Symbicort  Inhale 2 puffs into the lungs 2 (two) times daily.   cholecalciferol 25 MCG (1000 UNIT) tablet Commonly known as: VITAMIN D3 Take 2,000 Units by mouth daily.   clopidogrel  75 MG tablet Commonly known as: PLAVIX  Take 1 tablet by mouth once daily   cyclobenzaprine  5 MG tablet Commonly known as: FLEXERIL  Take 1 tablet (5 mg total) by mouth 3 (three) times daily as needed. for muscle spams   doxycycline  100 MG tablet Commonly known as: VIBRA -TABS Take 1 tablet (100 mg total) by mouth 2  (two) times daily for 16 days.   DULoxetine  30 MG capsule Commonly known as: CYMBALTA  Take 1 capsule (30 mg total) by mouth daily. What changed: how much to take   Farxiga  10 MG Tabs tablet Generic drug: dapagliflozin  propanediol Take 1 tablet (10 mg total) by mouth daily. Take 10 mg by mouth daily.   ferrous sulfate  325 (65 FE) MG tablet Take 325 mg by mouth every other day.   fluticasone  50 MCG/ACT nasal spray Commonly known as: FLONASE  Place 2 sprays into both nostrils daily. What changed:  when to take this reasons to take this   furosemide  20 MG tablet Commonly known as: LASIX  Take 1 tablet (20 mg total) by mouth daily as needed for fluid or edema.   HYDROcodone  bit-homatropine 5-1.5 MG/5ML syrup Commonly known as: HYCODAN Take 5 mLs by mouth every 8 (eight) hours as needed for cough.   metoprolol  tartrate 25 MG tablet Commonly known as: LOPRESSOR  Take 1 tablet (25 mg total) by mouth 2 (two) times daily. May take extra 25 mg tablet for as needed palpitations   montelukast  10 MG tablet Commonly known as: SINGULAIR  TAKE 1 TABLET BY MOUTH ONCE DAILY AT BEDTIME   Mounjaro  5 MG/0.5ML Pen Generic drug: tirzepatide  INJECT 1/2 (ONE-HALF) ML SUBCUTANEOUSLY  ONCE A WEEK   oxyCODONE  5 MG immediate release tablet Commonly known as: Oxy IR/ROXICODONE  Take 1 tablet (5 mg total) by mouth every 6 (six) hours as needed for moderate pain (pain score 4-6).   pantoprazole  40 MG tablet Commonly known as: PROTONIX  Take 1 tablet (40 mg total) by mouth 2 (two) times daily before a meal.   rosuvastatin  40 MG tablet Commonly known as: CRESTOR  Take 1 tablet by mouth once daily   sodium chloride  0.65 % Soln nasal spray Commonly known as: OCEAN Place 1 spray into both nostrils as needed for congestion.   telmisartan  40 MG tablet Commonly known as: MICARDIS  Take 1 tablet (40 mg total) by mouth daily.   tiotropium 18 MCG inhalation capsule Commonly known as: Spiriva   HandiHaler Place 1 capsule (18 mcg total) into inhaler and inhale daily.   traMADol  50 MG tablet Commonly known as: ULTRAM  Take 50 mg by mouth 3 (three) times daily as needed. for pain   ZyrTEC  Allergy 10 MG tablet Generic drug: cetirizine  Take 10 mg by mouth daily as needed for allergies or rhinitis.               Durable Medical Equipment  (From admission, onward)           Start     Ordered   10/12/23 1008  For home use only DME oxygen  Once       Question Answer Comment  Length of Need Lifetime   Mode or (Route) Nasal cannula   Liters per Minute 2   Oxygen delivery system Gas      10/12/23 1007   10/10/23 1033  For home use only DME Vac  Once       Comments: Please place prevena vac to suction prior to discharge   10/10/23 1032              Discharge Care Instructions  (From admission, onward)           Start     Ordered   10/13/23 0000  Discharge wound care:       Comments: Vac will stay for 1 week until f/u visit with orthopedics Cam walking boot WBAT   10/13/23 0919            Follow-up Information     [name], MD Follow up in 1 week(s).   Specialty: Orthopedic Surgery Contact information: [address] [place]  [name] [address] [phone]                Allergies  Allergen Reactions   Gabapentin Other (See Comments)    Fluid retention    Clindamycin  Rash   Sulfa Antibiotics Nausea And Vomiting     Other Procedures/Studies: MR TIBIA FIBULA LEFT WO CONTRAST Result Date: 10/07/2023 MR TIBIA AND FIBULA WITHOUT IV CONTRAST COMPARISON: None. CLINICAL HISTORY: Soft tissue infection suspected. PULSE SEQUENCES: Ax T1, Ax T2 FS, Sag T1, Sag T2 FS, Cor STIR, Cor T1 without contrast. FINDINGS: Bones: Mild degenerative changes are seen left knee with tricompartmental osteoarthrosis and reactive joint effusion. There is a small Baker's cyst. Otherwise, the bones are unremarkable. There is no marrow edema or evidence of  osteomyelitis. Musculotendinous structures: Musculotendinous structures demonstrate no significant myositis or fatty atrophy. No deep collection is present. There is moderate pretibial and lateral subcutaneous edema. There is suggestion of mild more focal collections along the anterior fascia in the mid pretibial region best seen on axial image 29 through 40 of series 14. It is difficult to determine if this could represent a more focal abscess or simply a phlegmonous process. Other correlation with MRI of the left tib-fib with IV contrast or focused ultrasound of the mid pretibial soft tissues to determine if there is a collection that can be sampled. IMPRESSION: Moderate focal pretibial and lateral subcutaneous edema in the mid left lower extremity. There is a more focal collection along the anterior compartment fascia which is indeterminate. This could reflect an abscess in the appropriate clinical setting. See above for more detail. Focused ultrasound with sampling if clinically indicated. No deep involvement or osseous involvement at the current time. Electronically signed by: Norleen Satchel MD 10/07/2023 11:14 AM EDT RP Workstation: [id number]   DG Chest 2 View Result Date: 10/06/2023 CLINICAL DATA:  Hypoxia EXAM: CHEST - 2 VIEW COMPARISON:  Chest x-ray 02/11/2023 FINDINGS: The heart size and mediastinal contours are within normal limits. Both lungs are clear. The visualized skeletal structures are unremarkable. IMPRESSION: No active cardiopulmonary disease. Electronically Signed   By: Greig Pique M.D.   On: 10/06/2023 22:44   DG Tibia/Fibula Left Result Date: 10/06/2023 CLINICAL DATA:  Lower extremity pain.  Potential for osteomyelitis. EXAM: LEFT TIBIA AND FIBULA - 2 VIEW COMPARISON:  Study of 08/10/2023. FINDINGS: AP Lat two-view exam obtained in 4 films, again reveals generalized edema in  the mid and distal foreleg beginning in the mid calf continuing inferiorly and greater laterally. In the  anterolateral mid foreleg there is a focally more prominent edema with 2 cm subcutaneous air pocket or soft tissue defect not seen previously. There is no radiopaque foreign body. No evidence of fractures or acute osteomyelitis. There is moderate arthrosis at the left knee and left ankle. Enthesopathic changes of the anterior patella. There is a popcorn like calcification again noted in the popliteal fossa which would either represent a calcified popliteal lymph node or osteochondral loose body such as within the posterior joint space or in a popliteal cyst. There are patchy calcifications in the posterior tibial artery. IMPRESSION: 1. Generalized edema in the mid and distal foreleg, greater laterally. 2. Focally more prominent edema in the anterolateral mid foreleg with 2 cm subcutaneous air pocket or soft tissue defect not seen previously. 3. No evidence of fractures or acute osteomyelitis. 4. Moderate arthrosis at the left knee and left ankle. 5. Calcified popliteal lymph node versus osteochondral loose body in the posterior joint space or in a popliteal cyst. Electronically Signed   By: Francis Quam M.D.   On: 10/06/2023 21:02     TODAY-DAY OF DISCHARGE:  Subjective:   Leianne Callins today has no headache,no chest abdominal pain,no new weakness tingling or numbness, feels much better wants to go home today.   Objective:   Blood pressure 123/78, pulse 65, temperature 98 F (36.7 C), temperature source Oral, resp. rate 17, height (P) 5' 5 (1.651 m), weight 88.8 kg, SpO2 98%.  Intake/Output Summary (Last 24 hours) at 10/13/2023 0920 Last data filed at 10/12/2023 2053 Gross per 24 hour  Intake 240 ml  Output 900 ml  Net -660 ml   Filed Weights   10/11/23 0500 10/12/23 0500 10/13/23 0416  Weight: 100.5 kg 105.3 kg 88.8 kg    Exam: Awake Alert, Oriented *3, No new F.N deficits, Normal affect [place].AT,PERRAL Supple Neck,No JVD, No cervical lymphadenopathy appriciated.  Symmetrical Chest wall  movement, Good air movement bilaterally, CTAB RRR,No Gallops,Rubs or new Murmurs, No Parasternal Heave +ve B.Sounds, Abd Soft, Non tender, No organomegaly appriciated, No rebound -guarding or rigidity. No Cyanosis, Clubbing or edema, No new Rash or bruise   PERTINENT RADIOLOGIC STUDIES: No results found.   PERTINENT LAB RESULTS: CBC: No results for input(s): WBC, HGB, HCT, PLT in the last 72 hours. CMET CMP     Component Value Date/Time   NA 137 10/12/2023 0657   NA 137 06/16/2019 1417   K 4.2 10/12/2023 0657   CL 100 10/12/2023 0657   CO2 26 10/12/2023 0657   GLUCOSE 107 (H) 10/12/2023 0657   BUN 35 (H) 10/12/2023 0657   BUN 18 06/16/2019 1417   CREATININE 2.10 (H) 10/12/2023 0657   CREATININE 1.44 (H) 09/30/2019 1532   CALCIUM  8.7 (L) 10/12/2023 0657   PROT 6.1 (L) 10/08/2023 0529   ALBUMIN 2.9 (L) 10/08/2023 0529   AST 12 (L) 10/08/2023 0529   ALT 11 10/08/2023 0529   ALKPHOS 89 10/08/2023 0529   BILITOT 0.4 10/08/2023 0529   GFR 25.84 (L) 06/05/2023 1520   GFRNONAA 24 (L) 10/12/2023 0657    GFR Estimated Creatinine Clearance: 24.3 mL/min (A) (by C-G formula based on SCr of 2.1 mg/dL (H)). No results for input(s): LIPASE, AMYLASE in the last 72 hours. No results for input(s): CKTOTAL, CKMB, CKMBINDEX, TROPONINI in the last 72 hours. Invalid input(s): POCBNP No results for input(s): DDIMER in the last 72  hours. No results for input(s): HGBA1C in the last 72 hours. No results for input(s): CHOL, HDL, LDLCALC, TRIG, CHOLHDL, LDLDIRECT in the last 72 hours. No results for input(s): TSH, T4TOTAL, T3FREE, THYROIDAB in the last 72 hours.  Invalid input(s): FREET3 No results for input(s): VITAMINB12, FOLATE, FERRITIN, TIBC, IRON, RETICCTPCT in the last 72 hours. Coags: No results for input(s): INR in the last 72 hours.  Invalid input(s): PT Microbiology: Recent Results (from the past 240 hours)   Culture, blood (Routine x 2)     Status: None   Collection Time: 10/06/23  7:08 PM   Specimen: BLOOD  Result Value Ref Range Status   Specimen Description BLOOD RIGHT ANTECUBITAL  Final   Special Requests AEROBIC BOTTLE ONLY Blood Culture adequate volume  Final   Culture   Final    NO GROWTH 5 DAYS Performed at [facility], [address], [place], [name] [address]    Report Status 10/11/2023 FINAL  Final  Culture, blood (Routine x 2)     Status: None   Collection Time: 10/06/23  7:32 PM   Specimen: BLOOD  Result Value Ref Range Status   Specimen Description BLOOD BLOOD LEFT ARM  Final   Special Requests   Final    BOTTLES DRAWN AEROBIC AND ANAEROBIC Blood Culture results may not be optimal due to an inadequate volume of blood received in culture bottles   Culture   Final    NO GROWTH 5 DAYS Performed at [facility], [address] [place], [name] [address]    Report Status 10/11/2023 FINAL  Final  Surgical pcr screen     Status: None   Collection Time: 10/09/23  9:54 AM   Specimen: Nasal Mucosa; Nasal Swab  Result Value Ref Range Status   MRSA, PCR NEGATIVE NEGATIVE Final   Staphylococcus aureus NEGATIVE NEGATIVE Final    Comment: (NOTE) The Xpert SA Assay (FDA approved for NASAL specimens in patients 36 years of age and older), is one component of a comprehensive surveillance program. It is not intended to diagnose infection nor to guide or monitor treatment. Performed at [facility], [address], [place], [name] [address]     FURTHER DISCHARGE INSTRUCTIONS:  Get Medicines reviewed and adjusted: Please take all your medications with you for your next visit with your Primary MD  Laboratory/radiological data: Please request your Primary MD to go over all hospital tests and procedure/radiological results at the follow up, please ask your Primary MD to get all Hospital records sent to his/her office.  In some cases, they will be blood work,  cultures and biopsy results pending at the time of your discharge. Please request that your primary care M.D. goes through all the records of your hospital data and follows up on these results.  Also Note the following: If you experience worsening of your admission symptoms, develop shortness of breath, life threatening emergency, suicidal or homicidal thoughts you must seek medical attention immediately by calling 911 or calling your MD immediately  if symptoms less severe.  You must read complete instructions/literature along with all the possible adverse reactions/side effects for all the Medicines you take and that have been prescribed to you. Take any new Medicines after you have completely understood and accpet all the possible adverse reactions/side effects.   Do not drive when taking Pain medications or sleeping medications (Benzodaizepines)  Do not take more than prescribed Pain, Sleep and Anxiety Medications. It is not advisable  to combine anxiety,sleep and pain medications without talking with your primary care practitioner  Special Instructions: If you have smoked or chewed Tobacco  in the last 2 yrs please stop smoking, stop any regular Alcohol   and or any Recreational drug use.  Wear Seat belts while driving.  Please note: You were cared for by a hospitalist during your hospital stay. Once you are discharged, your primary care physician will handle any further medical issues. Please note that NO REFILLS for any discharge medications will be authorized once you are discharged, as it is imperative that you return to your primary care physician (or establish a relationship with a primary care physician if you do not have one) for your post hospital discharge needs so that they can reassess your need for medications and monitor your lab values.  Total Time spent coordinating discharge including counseling, education and face to face time equals greater than 30 minutes.  Signed: Donalda Applebaum 10/13/2023 9:20 AM

## 2023-10-14 ENCOUNTER — Telehealth: Payer: Self-pay | Admitting: *Deleted

## 2023-10-14 LAB — SURGICAL PATHOLOGY

## 2023-10-14 NOTE — Transitions of Care (Post Inpatient/ED Visit) (Signed)
 10/14/2023  Name: Kerry Powell MRN: [id number] DOB: Apr 09, 1945  Today's TOC FU Call Status: Today's TOC FU Call Status:: Successful TOC FU Call Completed TOC FU Call Complete Date: 10/14/23 Patient's Name and Date of Birth confirmed.  Transition Care Management Follow-up Telephone Call Date of Discharge: 10/13/23 Discharge Facility: [facility]) Type of Discharge: Inpatient Admission Primary Inpatient Discharge Diagnosis:: Cellulitis of left lower extremity How have you been since you were released from the hospital?: Better Any questions or concerns?: No  Items Reviewed: Did you receive and understand the discharge instructions provided?: Yes Medications obtained,verified, and reconciled?: Yes (Medications Reviewed) Any new allergies since your discharge?: No Dietary orders reviewed?: No Do you have support at home?: Yes People in Home [RPT]: spouse Name of Support/Comfort Primary Source: [place]  Medications Reviewed Today: Medications Reviewed Today     Reviewed by Kennieth Cathlean DEL, RN (Case Manager) on 10/14/23 at 1625  Med List Status: <None>   Medication Order Taking? Sig Documenting Provider Last Dose Status Informant  albuterol  (PROVENTIL ) (2.5 MG/3ML) 0.083% nebulizer solution [id number] Yes USE 1 VIAL IN NEBULIZER EVERY 6 HOURS AND AS NEEDED FOR SHORT BREATH/WHEEZE.  Generic:  Ventolin  Meade Verdon RAMAN, MD  Active Self  allopurinol  (ZYLOPRIM ) 300 MG tablet [id number] Yes Take 1 tablet by mouth once daily Burns, Glade PARAS, MD  Active Self  amLODipine  (NORVASC ) 5 MG tablet [id number] Yes TAKE 1 & 1/2 (ONE & ONE-HALF) TABLETS BY MOUTH ONCE DAILY  Patient taking differently: Take 15 mg by mouth daily. TAKE 1 & 1/2 (ONE & ONE-HALF) TABLETS BY MOUTH ONCE DAILY   Burns, Glade PARAS, MD  Active Self  budesonide -formoterol  (SYMBICORT ) 160-4.5 MCG/ACT inhaler [id number] Yes Inhale 2 puffs into the lungs 2 (two) times daily. Raenelle Donalda HERO, MD  Active   cetirizine  (ZYRTEC   ALLERGY) 10 MG tablet [id number] Yes Take 10 mg by mouth daily as needed for allergies or rhinitis. [provider]  Active Self           Med Note DINO, REGEENA   Sun Oct 06, 2023  8:30 PM)    cholecalciferol (VITAMIN D3) 25 MCG (1000 UNIT) tablet [id number] Yes Take 2,000 Units by mouth daily. [provider]  Active Self  clopidogrel  (PLAVIX ) 75 MG tablet [id number] Yes Take 1 tablet by mouth once daily Burns, Glade PARAS, MD  Active Self  cyclobenzaprine  (FLEXERIL ) 5 MG tablet [id number] Yes Take 1 tablet (5 mg total) by mouth 3 (three) times daily as needed. for muscle spams Burns, Glade PARAS, MD  Active Self  doxycycline  (VIBRA -TABS) 100 MG tablet [id number] Yes Take 1 tablet (100 mg total) by mouth 2 (two) times daily for 16 days. Raenelle Donalda HERO, MD  Active   DULoxetine  (CYMBALTA ) 30 MG capsule [id number] Yes Take 1 capsule (30 mg total) by mouth daily.  Patient taking differently: Take 60 mg by mouth daily.   Geofm Glade PARAS, MD  Active Self  FARXIGA  10 MG TABS tablet [id number] Yes Take 1 tablet (10 mg total) by mouth daily. Take 10 mg by mouth daily. Geofm Glade PARAS, MD  Active Self  ferrous sulfate  325 (65 FE) MG tablet [id number] Yes Take 325 mg by mouth every other day. [provider]  Active Self  fluticasone  (FLONASE ) 50 MCG/ACT nasal spray [id number] Yes Place 2 sprays into both nostrils daily.  Patient taking differently: Place 2 sprays into both nostrils daily as needed for allergies.   Soldatova, [place], MD  Active Self  furosemide  (  LASIX ) 20 MG tablet [id number] Yes Take 1 tablet (20 mg total) by mouth daily as needed for fluid or edema. Geofm Glade PARAS, MD  Active Self  HYDROcodone  bit-homatropine [facility]) 5-1.5 MG/5ML syrup [id number] Yes Take 5 mLs by mouth every 8 (eight) hours as needed for cough. Geofm Glade PARAS, MD  Active Self  metoprolol  tartrate (LOPRESSOR ) 25 MG tablet [id number] Yes Take 1 tablet (25 mg total) by mouth 2 (two) times daily. May take extra  25 mg tablet for as needed palpitations Odell Castor, Erle, MD  Active Self  montelukast  (SINGULAIR ) 10 MG tablet [id number] Yes TAKE 1 TABLET BY MOUTH ONCE DAILY AT BEDTIME Geofm Glade PARAS, MD  Active Self  MOUNJARO  5 MG/0.5ML Pen [id number] Yes INJECT 1/2 (ONE-HALF) ML SUBCUTANEOUSLY  ONCE A WEEK Burns, Glade PARAS, MD  Active Self  oxyCODONE  (OXY IR/ROXICODONE ) 5 MG immediate release tablet [id number] Yes Take 1 tablet (5 mg total) by mouth every 6 (six) hours as needed for moderate pain (pain score 4-6). Raenelle Donalda HERO, MD  Active   pantoprazole  (PROTONIX ) 40 MG tablet [id number]  Take 1 tablet (40 mg total) by mouth 2 (two) times daily before a meal. Burns, Glade PARAS, MD  Active Self  rosuvastatin  (CRESTOR ) 40 MG tablet [id number] Yes Take 1 tablet by mouth once daily Turner, Wilbert SAUNDERS, MD  Active Self  sodium chloride  (OCEAN) 0.65 % SOLN nasal spray [id number] Yes Place 1 spray into both nostrils as needed for congestion. Desai, Nikita S, MD  Active Self  telmisartan  (MICARDIS ) 40 MG tablet [id number] Yes Take 1 tablet (40 mg total) by mouth daily. Geofm Glade PARAS, MD  Active Self  tiotropium (SPIRIVA  HANDIHALER) 18 MCG inhalation capsule [id number] Yes Place 1 capsule (18 mcg total) into inhaler and inhale daily. Raenelle Donalda HERO, MD  Active   traMADol  (ULTRAM ) 50 MG tablet [id number] Yes Take 50 mg by mouth 3 (three) times daily as needed. for pain [provider]  Active Self            Home Care and Equipment/Supplies: Were Home Health Services Ordered?: Yes Name of Home Health Agency:: [facility] Has Agency set up a time to come to your home?: No EMR reviewed for Home Health Orders: Orders present/patient has not received call (refer to CM for follow-up) (Per patient they agency is to call her) Any new equipment or medical supplies ordered?: Yes Name of Medical supply agency?: [facility] Were you able to get the equipment/medical supplies?: Yes Do you have any questions related to the use  of the equipment/supplies?: Yes What questions do you have?: Patient has a small tank and a large tank. She has a call into [facility] for refill  Functional Questionnaire: Do you need assistance with bathing/showering or dressing?: Yes Do you need assistance with meal preparation?: Yes Do you need assistance with eating?: No Do you have difficulty maintaining continence: No Do you need assistance with getting out of bed/getting out of a chair/moving?: Yes Do you have difficulty managing or taking your medications?: No  Follow up appointments reviewed: PCP Follow-up appointment confirmed?: Yes Date of PCP follow-up appointment?: 10/16/23 Follow-up Provider: Dr Geofm [facility] Follow-up appointment confirmed?: Yes Date of Specialist follow-up appointment?: 10/31/23 Follow-Up Specialty Provider:: Dr Harden  SDOH Interventions Today    Flowsheet Row Most Recent Value  SDOH Interventions   Food Insecurity Interventions Intervention Not Indicated  Housing Interventions Intervention Not Indicated  Transportation Interventions Intervention Not Indicated, Patient Resources (Friends/Family)  Utilities Interventions Intervention  Not Indicated    Cathlean Headland BSN RN [facility] Care Management Coordinator [email or web address] Direct Dial: [phone]  Fax: [phone] Website: [email or web address]

## 2023-10-15 ENCOUNTER — Telehealth: Payer: Self-pay | Admitting: *Deleted

## 2023-10-15 NOTE — Progress Notes (Unsigned)
 Subjective:    Patient ID: Kerry Powell, female    DOB: August 10, 1945, 78 y.o.   MRN: [id number]     HPI Kerry Powell is here for follow up from the hospital.   Admitted 8/11-8/17 for cellulitis of left lower extremity and severe sepsis  Outpatient follow-up recommendations: CBC, CMP in 1 week Follow-up with orthopedics Follow-up with pulmonary-discharged on oxygen   She presented on 8/10 with left lower extremity pain, erythema, purulent discharge associated with fevers.  She was found to have sepsis secondary to left lower extremity cellulitis with abscess.  Severe sepsis secondary to left lower extremity purulent cellulitis and abscess MRI 8/11 of the tibia/fibula, left: Focal collection along the anterior compartment-could reflect an abscess Blood cultures without growth 8/13 Dr. Velna of soft tissue muscle/fascia/necrotic tissue in left calf.  Application of wound VAC sponge  Culture sent to pathology instead of microbiology Orthopedics advised 3 weeks of antibiotics-transition to doxycycline  on discharge Prevena VAC plan for discharge, weightbearing as tolerated   Acute hypoxic respiratory failure secondary to COPD exacerbation Much improved Treated with steroids, bronchodilators Started on Brovana /yupelri /budesonide  nebs Qualified for home O2 Follow-up with pulmonary   Hypertension BP stable Continued on metoprolol , Norvasc , ARB  Diabetes type 2 with mild hyperglycemia due to steroids A1c 6.4% Glucose stable on inpatient sliding scale insulin  Resume oral hypoglycemic agents on discharge  Hyperlipidemia Home statin  Chronic HFpEF Euvolemic on exam As needed Lasix   CAD Stable Plavix , Crestor   CKD stage IIIb Stable-at baseline   Medications and allergies reviewed with patient and updated if appropriate.  Current Outpatient Medications on File Prior to Visit  Medication Sig Dispense Refill   albuterol  (PROVENTIL ) (2.5 MG/3ML) 0.083%  nebulizer solution USE 1 VIAL IN NEBULIZER EVERY 6 HOURS AND AS NEEDED FOR SHORT BREATH/WHEEZE.  Generic:  Ventolin  75 mL 2   allopurinol  (ZYLOPRIM ) 300 MG tablet Take 1 tablet by mouth once daily 90 tablet 0   amLODipine  (NORVASC ) 5 MG tablet TAKE 1 & 1/2 (ONE & ONE-HALF) TABLETS BY MOUTH ONCE DAILY (Patient taking differently: Take 15 mg by mouth daily. TAKE 1 & 1/2 (ONE & ONE-HALF) TABLETS BY MOUTH ONCE DAILY) 135 tablet 1   budesonide -formoterol  (SYMBICORT ) 160-4.5 MCG/ACT inhaler Inhale 2 puffs into the lungs 2 (two) times daily. 1 each 12   cetirizine  (ZYRTEC  ALLERGY) 10 MG tablet Take 10 mg by mouth daily as needed for allergies or rhinitis.     cholecalciferol (VITAMIN D3) 25 MCG (1000 UNIT) tablet Take 2,000 Units by mouth daily.     clopidogrel  (PLAVIX ) 75 MG tablet Take 1 tablet by mouth once daily 90 tablet 0   cyclobenzaprine  (FLEXERIL ) 5 MG tablet Take 1 tablet (5 mg total) by mouth 3 (three) times daily as needed. for muscle spams 270 tablet 1   doxycycline  (VIBRA -TABS) 100 MG tablet Take 1 tablet (100 mg total) by mouth 2 (two) times daily for 16 days. 32 tablet 0   DULoxetine  (CYMBALTA ) 30 MG capsule Take 1 capsule (30 mg total) by mouth daily. (Patient taking differently: Take 60 mg by mouth daily.) 90 capsule 1   FARXIGA  10 MG TABS tablet Take 1 tablet (10 mg total) by mouth daily. Take 10 mg by mouth daily. 90 tablet 3   ferrous sulfate  325 (65 FE) MG tablet Take 325 mg by mouth every other day.     fluticasone  (FLONASE ) 50 MCG/ACT nasal spray Place 2 sprays into both nostrils daily. (Patient taking differently: Place 2 sprays  into both nostrils daily as needed for allergies.) 16 g 6   furosemide  (LASIX ) 20 MG tablet Take 1 tablet (20 mg total) by mouth daily as needed for fluid or edema.     HYDROcodone  bit-homatropine (HYCODAN) 5-1.5 MG/5ML syrup Take 5 mLs by mouth every 8 (eight) hours as needed for cough. 120 mL 0   metoprolol  tartrate (LOPRESSOR ) 25 MG tablet Take 1 tablet  (25 mg total) by mouth 2 (two) times daily. May take extra 25 mg tablet for as needed palpitations 60 tablet 3   montelukast  (SINGULAIR ) 10 MG tablet TAKE 1 TABLET BY MOUTH ONCE DAILY AT BEDTIME 30 tablet 5   MOUNJARO  5 MG/0.5ML Pen INJECT 1/2 (ONE-HALF) ML SUBCUTANEOUSLY  ONCE A WEEK 4 mL 0   oxyCODONE  (OXY IR/ROXICODONE ) 5 MG immediate release tablet Take 1 tablet (5 mg total) by mouth every 6 (six) hours as needed for moderate pain (pain score 4-6). 30 tablet 0   pantoprazole  (PROTONIX ) 40 MG tablet Take 1 tablet (40 mg total) by mouth 2 (two) times daily before a meal. 180 tablet 1   rosuvastatin  (CRESTOR ) 40 MG tablet Take 1 tablet by mouth once daily 90 tablet 3   sodium chloride  (OCEAN) 0.65 % SOLN nasal spray Place 1 spray into both nostrils as needed for congestion. 480 mL 1   telmisartan  (MICARDIS ) 40 MG tablet Take 1 tablet (40 mg total) by mouth daily. 90 tablet 2   tiotropium (SPIRIVA  HANDIHALER) 18 MCG inhalation capsule Place 1 capsule (18 mcg total) into inhaler and inhale daily. 30 capsule 2   traMADol  (ULTRAM ) 50 MG tablet Take 50 mg by mouth 3 (three) times daily as needed. for pain     No current facility-administered medications on file prior to visit.     Review of Systems     Objective:  There were no vitals filed for this visit. BP Readings from Last 3 Encounters:  10/13/23 123/78  10/03/23 102/69  08/26/23 108/72   Wt Readings from Last 3 Encounters:  10/13/23 195 lb 11.2 oz (88.8 kg)  08/10/23 226 lb 3.1 oz (102.6 kg)  08/05/23 226 lb 6.4 oz (102.7 kg)   There is no height or weight on file to calculate BMI.    Physical Exam     Lab Results  Component Value Date   WBC 12.5 (H) 10/10/2023   HGB 13.3 10/10/2023   HCT 41.6 10/10/2023   PLT 288 10/10/2023   GLUCOSE 107 (H) 10/12/2023   CHOL 119 06/05/2023   TRIG 101.0 06/05/2023   HDL 41.90 06/05/2023   LDLDIRECT 79.0 08/01/2020   LDLCALC 57 06/05/2023   ALT 11 10/08/2023   AST 12 (L) 10/08/2023    NA 137 10/12/2023   K 4.2 10/12/2023   CL 100 10/12/2023   CREATININE 2.10 (H) 10/12/2023   BUN 35 (H) 10/12/2023   CO2 26 10/12/2023   TSH 1.706 10/07/2023   INR 1.0 10/06/2023   HGBA1C 6.4 (H) 10/07/2023   MICROALBUR 0.9 06/05/2023   MR TIBIA FIBULA LEFT WO CONTRAST MR TIBIA AND FIBULA WITHOUT IV CONTRAST  COMPARISON: None.  CLINICAL HISTORY: Soft tissue infection suspected.  PULSE SEQUENCES: Ax T1, Ax T2 FS, Sag T1, Sag T2 FS, Cor STIR, Cor T1 without contrast.  FINDINGS:  Bones: Mild degenerative changes are seen left knee with tricompartmental osteoarthrosis and reactive joint effusion. There is a small Baker's cyst. Otherwise, the bones are unremarkable. There is no marrow edema or evidence of osteomyelitis.  Musculotendinous  structures: Musculotendinous structures demonstrate no significant myositis or fatty atrophy. No deep collection is present. There is moderate pretibial and lateral subcutaneous edema. There is suggestion of mild more focal collections along the anterior fascia in the mid pretibial region best seen on axial image 29 through 40 of series 14. It is difficult to determine if this could represent a more focal abscess or simply a phlegmonous process. Other correlation with MRI of the left tib-fib with IV contrast or focused ultrasound of the mid pretibial soft tissues to determine if there is a collection that can be sampled.  IMPRESSION: Moderate focal pretibial and lateral subcutaneous edema in the mid left lower extremity. There is a more focal collection along the anterior compartment fascia which is indeterminate. This could reflect an abscess in the appropriate clinical setting. See above for more detail. Focused ultrasound with sampling if clinically indicated.  No deep involvement or osseous involvement at the current time.  Electronically signed by: Norleen Satchel MD 10/07/2023 11:14 AM EDT RP Workstation:  [id number]    Assessment & Plan:    See Problem List for Assessment and Plan of chronic medical problems.

## 2023-10-15 NOTE — Telephone Encounter (Signed)
 Received CMN for Nebulizer and Albuterol  from [employer]  Placed in Dr. Correne folder for signature.

## 2023-10-15 NOTE — Patient Instructions (Incomplete)
    Call [facility] at Phone: [phone]     Blood work was ordered.       Medications changes include :   decrease amlodipine  to 5 mg daily   Monitor your BP

## 2023-10-15 NOTE — Telephone Encounter (Signed)
 CMNs signed and faxed to [place] [employer] [facility]  Received fax that it was sent successfully.  Nothing further needed.

## 2023-10-15 NOTE — Assessment & Plan Note (Signed)
 Chronic Following with nephrology-Dr. Jerrye Kidney function slightly worse with recent hospitalization

## 2023-10-16 ENCOUNTER — Encounter: Payer: Self-pay | Admitting: Internal Medicine

## 2023-10-16 ENCOUNTER — Telehealth: Payer: Self-pay

## 2023-10-16 ENCOUNTER — Ambulatory Visit (INDEPENDENT_AMBULATORY_CARE_PROVIDER_SITE_OTHER): Admitting: Internal Medicine

## 2023-10-16 VITALS — BP 104/60 | HR 84 | Temp 97.8°F | Ht 65.0 in

## 2023-10-16 DIAGNOSIS — E785 Hyperlipidemia, unspecified: Secondary | ICD-10-CM

## 2023-10-16 DIAGNOSIS — I739 Peripheral vascular disease, unspecified: Secondary | ICD-10-CM

## 2023-10-16 DIAGNOSIS — I152 Hypertension secondary to endocrine disorders: Secondary | ICD-10-CM

## 2023-10-16 DIAGNOSIS — L03116 Cellulitis of left lower limb: Secondary | ICD-10-CM

## 2023-10-16 DIAGNOSIS — F3289 Other specified depressive episodes: Secondary | ICD-10-CM

## 2023-10-16 DIAGNOSIS — E1169 Type 2 diabetes mellitus with other specified complication: Secondary | ICD-10-CM

## 2023-10-16 DIAGNOSIS — R6520 Severe sepsis without septic shock: Secondary | ICD-10-CM

## 2023-10-16 DIAGNOSIS — A419 Sepsis, unspecified organism: Secondary | ICD-10-CM

## 2023-10-16 DIAGNOSIS — J441 Chronic obstructive pulmonary disease with (acute) exacerbation: Secondary | ICD-10-CM

## 2023-10-16 DIAGNOSIS — J9611 Chronic respiratory failure with hypoxia: Secondary | ICD-10-CM

## 2023-10-16 DIAGNOSIS — Z7985 Long-term (current) use of injectable non-insulin antidiabetic drugs: Secondary | ICD-10-CM

## 2023-10-16 DIAGNOSIS — J9601 Acute respiratory failure with hypoxia: Secondary | ICD-10-CM | POA: Diagnosis not present

## 2023-10-16 DIAGNOSIS — I5032 Chronic diastolic (congestive) heart failure: Secondary | ICD-10-CM

## 2023-10-16 DIAGNOSIS — L02416 Cutaneous abscess of left lower limb: Secondary | ICD-10-CM

## 2023-10-16 DIAGNOSIS — Z7984 Long term (current) use of oral hypoglycemic drugs: Secondary | ICD-10-CM

## 2023-10-16 DIAGNOSIS — N184 Chronic kidney disease, stage 4 (severe): Secondary | ICD-10-CM

## 2023-10-16 LAB — COMPREHENSIVE METABOLIC PANEL WITH GFR
ALT: 17 U/L (ref 0–35)
AST: 17 U/L (ref 0–37)
Albumin: 3.8 g/dL (ref 3.5–5.2)
Alkaline Phosphatase: 96 U/L (ref 39–117)
BUN: 24 mg/dL — ABNORMAL HIGH (ref 6–23)
CO2: 32 meq/L (ref 19–32)
Calcium: 9.1 mg/dL (ref 8.4–10.5)
Chloride: 98 meq/L (ref 96–112)
Creatinine, Ser: 1.87 mg/dL — ABNORMAL HIGH (ref 0.40–1.20)
GFR: 25.45 mL/min — ABNORMAL LOW (ref >60.00)
Glucose, Bld: 116 mg/dL — ABNORMAL HIGH (ref 70–99)
Potassium: 4.3 meq/L (ref 3.5–5.1)
Sodium: 137 meq/L (ref 135–145)
Total Bilirubin: 0.7 mg/dL (ref 0.2–1.2)
Total Protein: 7.7 g/dL (ref 6.0–8.3)

## 2023-10-16 LAB — CBC WITH DIFFERENTIAL/PLATELET
Basophils Absolute: 0 K/uL (ref 0.0–0.1)
Basophils Relative: 0.3 % (ref 0.0–3.0)
Eosinophils Absolute: 0.2 K/uL (ref 0.0–0.7)
Eosinophils Relative: 1.6 % (ref 0.0–5.0)
HCT: 48.8 % — ABNORMAL HIGH (ref 36.0–46.0)
Hemoglobin: 15.7 g/dL — ABNORMAL HIGH (ref 12.0–15.0)
Lymphocytes Relative: 23.4 % (ref 12.0–46.0)
Lymphs Abs: 2.3 K/uL (ref 0.7–4.0)
MCHC: 32.2 g/dL (ref 30.0–36.0)
MCV: 94.3 fl (ref 78.0–100.0)
Monocytes Absolute: 1.2 K/uL — ABNORMAL HIGH (ref 0.1–1.0)
Monocytes Relative: 12.5 % — ABNORMAL HIGH (ref 3.0–12.0)
Neutro Abs: 6 K/uL (ref 1.4–7.7)
Neutrophils Relative %: 62.2 % (ref 43.0–77.0)
Platelets: 333 K/uL (ref 150.0–400.0)
RBC: 5.17 Mil/uL — ABNORMAL HIGH (ref 3.87–5.11)
RDW: 14.6 % (ref 11.5–15.5)
WBC: 9.6 K/uL (ref 4.0–10.5)

## 2023-10-16 MED ORDER — DULOXETINE HCL 30 MG PO CPEP: 60.0000 mg | ORAL_CAPSULE | Freq: Every day | ORAL | Status: DC

## 2023-10-16 MED ORDER — AMLODIPINE BESYLATE 5 MG PO TABS: 5.0000 mg | ORAL_TABLET | Freq: Every day | ORAL | 1 refills | Status: DC

## 2023-10-16 NOTE — Assessment & Plan Note (Signed)
 Chronic On plavix , crestor 

## 2023-10-16 NOTE — Assessment & Plan Note (Signed)
 Recent hospitalization with cellulitis, abscess and sepsis Has surgery - now with wound vac on doxycycline  Has f/u with ortho 9/4 No fever

## 2023-10-16 NOTE — Assessment & Plan Note (Signed)
Chronic Continue crestor 40 mg daily

## 2023-10-16 NOTE — Assessment & Plan Note (Signed)
 Resolved Secondary to LLE cellulitis, abscess Completing doxycycline 

## 2023-10-16 NOTE — Assessment & Plan Note (Signed)
 Chronic Increased a little related to recently hospitalization Continue duloxetine  60 mg daily

## 2023-10-16 NOTE — Assessment & Plan Note (Signed)
 Chronic Found to qualify for oxygen in the hospital On 2 L / min via [place] -- advised to use for 24. 7 Has home unit but not portable unit which is medically necessary -dme ordered To f/u with pulm

## 2023-10-16 NOTE — Telephone Encounter (Signed)
 Copied from CRM [id number]. Topic: General - Other >> Oct 15, 2023  1:46 PM Berneda FALCON wrote: Reason for CRM: Kerry Powell from [facility] requsted a delay of care in her physical therapy. Did not have a reason as to why. This will now begin on 8/22.  [place] [phone]

## 2023-10-16 NOTE — Assessment & Plan Note (Signed)
Chronic Appears euvolemic Continue current medications 

## 2023-10-16 NOTE — Assessment & Plan Note (Signed)
 Chronic  Lab Results  Component Value Date   HGBA1C 6.4 (H) 10/07/2023   Sugars controlled Continue mounjaro  5 mg weekly, Farxiga  10 mg daily

## 2023-10-16 NOTE — Assessment & Plan Note (Signed)
 Much improved Treated with steroid, bronchodilators To f/u with pulm Started on 24 x 7 O2 Continue spiriva 

## 2023-10-16 NOTE — Telephone Encounter (Signed)
 Noted

## 2023-10-16 NOTE — Assessment & Plan Note (Signed)
 Resolved S/p surgery for abscess  To complete doxycycline  as an outpatient

## 2023-10-16 NOTE — Assessment & Plan Note (Signed)
 Chronic Blood pressure has been low side and has some lightheadedness Decrease amlodipine  to 5 mg daily Continue telmisartan  to 40 mg daily, metoprolol  25 mg twice daily Monitor BP at home

## 2023-10-16 NOTE — Assessment & Plan Note (Signed)
 Resolved Related to COPD exacerbation Treated with steroids, bronchodilators Improved Qualified for O2 which is new Sees pulm and will schedule a f/u - gave number [employer] - will order

## 2023-10-16 NOTE — Addendum Note (Signed)
 Addended by: GEOFM GLADE PARAS on: 10/16/2023 08:52 PM   Modules accepted: Orders

## 2023-10-17 ENCOUNTER — Ambulatory Visit: Payer: Self-pay | Admitting: Internal Medicine

## 2023-10-18 ENCOUNTER — Telehealth: Payer: Self-pay

## 2023-10-18 NOTE — Telephone Encounter (Signed)
 Message left on VM from patient stating her wound vac is no longer working Come here or take it off at home Her call back #[phone]

## 2023-10-18 NOTE — Telephone Encounter (Signed)
 Kerry Powell pt, asked her if she could come in this afternoon so we can remove the wound vac. Her surgery was on 10/09/23 so it is overdue to come off. She declined coming into the office today due to lack of transportation. She has no car and her family is not at home. She said she will go to ER after her family came home and see hospital can remove it for her. I couldn't guarantee that they would do that, but she could try if she had no other way of taking it off. She also said that she has a nurse/friend and she might could remove it. I told her how to remove the vac and she believes they can do it at home. I let her know to cover surgical site with dry dressing and I rescheduled her apt to 10/21/23 and will cancel the 10/31/23 one.

## 2023-10-20 ENCOUNTER — Encounter: Payer: Self-pay | Admitting: Internal Medicine

## 2023-10-21 ENCOUNTER — Inpatient Hospital Stay (HOSPITAL_COMMUNITY)
Admission: EM | Admit: 2023-10-21 | Discharge: 2023-10-31 | DRG: 480 | Disposition: A | Discharge status: Skilled Nursing Facility | Attending: Internal Medicine | Admitting: Internal Medicine

## 2023-10-21 ENCOUNTER — Encounter: Admitting: Physician Assistant

## 2023-10-21 ENCOUNTER — Encounter (HOSPITAL_COMMUNITY): Payer: Self-pay

## 2023-10-21 ENCOUNTER — Emergency Department (HOSPITAL_COMMUNITY)

## 2023-10-21 ENCOUNTER — Other Ambulatory Visit: Payer: Self-pay

## 2023-10-21 ENCOUNTER — Telehealth: Payer: Self-pay

## 2023-10-21 DIAGNOSIS — Z6834 Body mass index (BMI) 34.0-34.9, adult: Secondary | ICD-10-CM

## 2023-10-21 DIAGNOSIS — D631 Anemia in chronic kidney disease: Secondary | ICD-10-CM | POA: Diagnosis present

## 2023-10-21 DIAGNOSIS — L03116 Cellulitis of left lower limb: Secondary | ICD-10-CM | POA: Diagnosis not present

## 2023-10-21 DIAGNOSIS — E43 Unspecified severe protein-calorie malnutrition: Secondary | ICD-10-CM | POA: Diagnosis present

## 2023-10-21 DIAGNOSIS — E1165 Type 2 diabetes mellitus with hyperglycemia: Secondary | ICD-10-CM | POA: Diagnosis present

## 2023-10-21 DIAGNOSIS — Z85528 Personal history of other malignant neoplasm of kidney: Secondary | ICD-10-CM

## 2023-10-21 DIAGNOSIS — I130 Hypertensive heart and chronic kidney disease with heart failure and stage 1 through stage 4 chronic kidney disease, or unspecified chronic kidney disease: Secondary | ICD-10-CM | POA: Diagnosis present

## 2023-10-21 DIAGNOSIS — J441 Chronic obstructive pulmonary disease with (acute) exacerbation: Secondary | ICD-10-CM | POA: Diagnosis not present

## 2023-10-21 DIAGNOSIS — S72002A Fracture of unspecified part of neck of left femur, initial encounter for closed fracture: Principal | ICD-10-CM

## 2023-10-21 DIAGNOSIS — Z9981 Dependence on supplemental oxygen: Secondary | ICD-10-CM | POA: Diagnosis not present

## 2023-10-21 DIAGNOSIS — E785 Hyperlipidemia, unspecified: Secondary | ICD-10-CM | POA: Diagnosis present

## 2023-10-21 DIAGNOSIS — Z8249 Family history of ischemic heart disease and other diseases of the circulatory system: Secondary | ICD-10-CM

## 2023-10-21 DIAGNOSIS — E8721 Acute metabolic acidosis: Secondary | ICD-10-CM | POA: Diagnosis present

## 2023-10-21 DIAGNOSIS — I739 Peripheral vascular disease, unspecified: Secondary | ICD-10-CM | POA: Diagnosis present

## 2023-10-21 DIAGNOSIS — Z833 Family history of diabetes mellitus: Secondary | ICD-10-CM

## 2023-10-21 DIAGNOSIS — Y92009 Unspecified place in unspecified non-institutional (private) residence as the place of occurrence of the external cause: Secondary | ICD-10-CM | POA: Diagnosis not present

## 2023-10-21 DIAGNOSIS — G8929 Other chronic pain: Secondary | ICD-10-CM | POA: Diagnosis present

## 2023-10-21 DIAGNOSIS — F419 Anxiety disorder, unspecified: Secondary | ICD-10-CM | POA: Diagnosis present

## 2023-10-21 DIAGNOSIS — E1122 Type 2 diabetes mellitus with diabetic chronic kidney disease: Secondary | ICD-10-CM

## 2023-10-21 DIAGNOSIS — A419 Sepsis, unspecified organism: Secondary | ICD-10-CM | POA: Diagnosis not present

## 2023-10-21 DIAGNOSIS — J449 Chronic obstructive pulmonary disease, unspecified: Secondary | ICD-10-CM | POA: Diagnosis present

## 2023-10-21 DIAGNOSIS — I5032 Chronic diastolic (congestive) heart failure: Secondary | ICD-10-CM | POA: Diagnosis present

## 2023-10-21 DIAGNOSIS — Z881 Allergy status to other antibiotic agents status: Secondary | ICD-10-CM

## 2023-10-21 DIAGNOSIS — J9611 Chronic respiratory failure with hypoxia: Secondary | ICD-10-CM | POA: Diagnosis present

## 2023-10-21 DIAGNOSIS — N1832 Chronic kidney disease, stage 3b: Secondary | ICD-10-CM | POA: Diagnosis present

## 2023-10-21 DIAGNOSIS — R079 Chest pain, unspecified: Secondary | ICD-10-CM | POA: Diagnosis not present

## 2023-10-21 DIAGNOSIS — Z823 Family history of stroke: Secondary | ICD-10-CM

## 2023-10-21 DIAGNOSIS — J432 Centrilobular emphysema: Secondary | ICD-10-CM | POA: Diagnosis present

## 2023-10-21 DIAGNOSIS — I2510 Atherosclerotic heart disease of native coronary artery without angina pectoris: Secondary | ICD-10-CM | POA: Diagnosis present

## 2023-10-21 DIAGNOSIS — Z8543 Personal history of malignant neoplasm of ovary: Secondary | ICD-10-CM

## 2023-10-21 DIAGNOSIS — S0003XA Contusion of scalp, initial encounter: Secondary | ICD-10-CM | POA: Diagnosis present

## 2023-10-21 DIAGNOSIS — S2242XA Multiple fractures of ribs, left side, initial encounter for closed fracture: Secondary | ICD-10-CM | POA: Diagnosis present

## 2023-10-21 DIAGNOSIS — Z7951 Long term (current) use of inhaled steroids: Secondary | ICD-10-CM | POA: Diagnosis not present

## 2023-10-21 DIAGNOSIS — Z8349 Family history of other endocrine, nutritional and metabolic diseases: Secondary | ICD-10-CM

## 2023-10-21 DIAGNOSIS — E66811 Obesity, class 1: Secondary | ICD-10-CM | POA: Diagnosis present

## 2023-10-21 DIAGNOSIS — K5900 Constipation, unspecified: Secondary | ICD-10-CM | POA: Diagnosis not present

## 2023-10-21 DIAGNOSIS — J9601 Acute respiratory failure with hypoxia: Secondary | ICD-10-CM | POA: Diagnosis not present

## 2023-10-21 DIAGNOSIS — J9621 Acute and chronic respiratory failure with hypoxia: Secondary | ICD-10-CM | POA: Diagnosis not present

## 2023-10-21 DIAGNOSIS — Z8261 Family history of arthritis: Secondary | ICD-10-CM

## 2023-10-21 DIAGNOSIS — S72142A Displaced intertrochanteric fracture of left femur, initial encounter for closed fracture: Secondary | ICD-10-CM | POA: Diagnosis present

## 2023-10-21 DIAGNOSIS — E1151 Type 2 diabetes mellitus with diabetic peripheral angiopathy without gangrene: Secondary | ICD-10-CM | POA: Diagnosis present

## 2023-10-21 DIAGNOSIS — E1169 Type 2 diabetes mellitus with other specified complication: Secondary | ICD-10-CM | POA: Diagnosis present

## 2023-10-21 DIAGNOSIS — R296 Repeated falls: Secondary | ICD-10-CM

## 2023-10-21 DIAGNOSIS — I513 Intracardiac thrombosis, not elsewhere classified: Secondary | ICD-10-CM | POA: Diagnosis present

## 2023-10-21 DIAGNOSIS — Z01810 Encounter for preprocedural cardiovascular examination: Secondary | ICD-10-CM | POA: Diagnosis not present

## 2023-10-21 DIAGNOSIS — E119 Type 2 diabetes mellitus without complications: Secondary | ICD-10-CM

## 2023-10-21 DIAGNOSIS — L02416 Cutaneous abscess of left lower limb: Secondary | ICD-10-CM | POA: Diagnosis not present

## 2023-10-21 DIAGNOSIS — K219 Gastro-esophageal reflux disease without esophagitis: Secondary | ICD-10-CM | POA: Diagnosis present

## 2023-10-21 DIAGNOSIS — W19XXXA Unspecified fall, initial encounter: Secondary | ICD-10-CM | POA: Diagnosis not present

## 2023-10-21 DIAGNOSIS — Z825 Family history of asthma and other chronic lower respiratory diseases: Secondary | ICD-10-CM

## 2023-10-21 DIAGNOSIS — Z841 Family history of disorders of kidney and ureter: Secondary | ICD-10-CM

## 2023-10-21 DIAGNOSIS — G2581 Restless legs syndrome: Secondary | ICD-10-CM | POA: Diagnosis present

## 2023-10-21 DIAGNOSIS — Z7902 Long term (current) use of antithrombotics/antiplatelets: Secondary | ICD-10-CM

## 2023-10-21 DIAGNOSIS — Z888 Allergy status to other drugs, medicaments and biological substances status: Secondary | ICD-10-CM

## 2023-10-21 DIAGNOSIS — N184 Chronic kidney disease, stage 4 (severe): Secondary | ICD-10-CM | POA: Diagnosis not present

## 2023-10-21 DIAGNOSIS — R339 Retention of urine, unspecified: Secondary | ICD-10-CM | POA: Diagnosis not present

## 2023-10-21 DIAGNOSIS — M549 Dorsalgia, unspecified: Secondary | ICD-10-CM | POA: Diagnosis present

## 2023-10-21 DIAGNOSIS — Z818 Family history of other mental and behavioral disorders: Secondary | ICD-10-CM

## 2023-10-21 DIAGNOSIS — Z79899 Other long term (current) drug therapy: Secondary | ICD-10-CM | POA: Diagnosis not present

## 2023-10-21 DIAGNOSIS — Z87891 Personal history of nicotine dependence: Secondary | ICD-10-CM

## 2023-10-21 DIAGNOSIS — Z8673 Personal history of transient ischemic attack (TIA), and cerebral infarction without residual deficits: Secondary | ICD-10-CM

## 2023-10-21 HISTORY — DX: Heart failure, unspecified: I50.9

## 2023-10-21 LAB — GLUCOSE, CAPILLARY: Glucose-Capillary: 108 mg/dL — ABNORMAL HIGH (ref 70–99)

## 2023-10-21 LAB — SAMPLE TO BLOOD BANK

## 2023-10-21 LAB — CBC
HCT: 45.8 % (ref 36.0–46.0)
Hemoglobin: 14.6 g/dL (ref 12.0–15.0)
MCH: 30.4 pg (ref 26.0–34.0)
MCHC: 31.9 g/dL (ref 30.0–36.0)
MCV: 95.4 fL (ref 80.0–100.0)
Platelets: 304 K/uL (ref 150–400)
RBC: 4.8 MIL/uL (ref 3.87–5.11)
RDW: 14.2 % (ref 11.5–15.5)
WBC: 15.1 K/uL — ABNORMAL HIGH (ref 4.0–10.5)
nRBC: 0 % (ref 0.0–0.2)

## 2023-10-21 LAB — COMPREHENSIVE METABOLIC PANEL WITH GFR
ALT: 16 U/L (ref 0–44)
AST: 20 U/L (ref 15–41)
Albumin: 3.1 g/dL — ABNORMAL LOW (ref 3.5–5.0)
Alkaline Phosphatase: 79 U/L (ref 38–126)
Anion gap: 8 (ref 5–15)
BUN: 22 mg/dL (ref 8–23)
CO2: 20 mmol/L — ABNORMAL LOW (ref 22–32)
Calcium: 9.1 mg/dL (ref 8.9–10.3)
Chloride: 109 mmol/L (ref 98–111)
Creatinine, Ser: 1.97 mg/dL — ABNORMAL HIGH (ref 0.44–1.00)
GFR, Estimated: 26 mL/min — ABNORMAL LOW (ref >60)
Glucose, Bld: 147 mg/dL — ABNORMAL HIGH (ref 70–99)
Potassium: 4.2 mmol/L (ref 3.5–5.1)
Sodium: 137 mmol/L (ref 135–145)
Total Bilirubin: 0.8 mg/dL (ref 0.0–1.2)
Total Protein: 7.1 g/dL (ref 6.5–8.1)

## 2023-10-21 LAB — PROTIME-INR
INR: 1 (ref 0.8–1.2)
Prothrombin Time: 14 s (ref 11.4–15.2)

## 2023-10-21 LAB — I-STAT CHEM 8, ED
BUN: 25 mg/dL — ABNORMAL HIGH (ref 8–23)
Calcium, Ion: 1.09 mmol/L — ABNORMAL LOW (ref 1.15–1.40)
Chloride: 107 mmol/L (ref 98–111)
Creatinine, Ser: 2 mg/dL — ABNORMAL HIGH (ref 0.44–1.00)
Glucose, Bld: 146 mg/dL — ABNORMAL HIGH (ref 70–99)
HCT: 47 % — ABNORMAL HIGH (ref 36.0–46.0)
Hemoglobin: 16 g/dL — ABNORMAL HIGH (ref 12.0–15.0)
Potassium: 4.3 mmol/L (ref 3.5–5.1)
Sodium: 139 mmol/L (ref 135–145)
TCO2: 22 mmol/L (ref 22–32)

## 2023-10-21 LAB — I-STAT CG4 LACTIC ACID, ED: Lactic Acid, Venous: 1.1 mmol/L (ref 0.5–1.9)

## 2023-10-21 LAB — ETHANOL: Alcohol, Ethyl (B): <15 mg/dL (ref <15)

## 2023-10-21 MED ORDER — HYDROCODONE-ACETAMINOPHEN 5-325 MG PO TABS
1.0000 | ORAL_TABLET | Freq: Four times a day (QID) | ORAL | Status: DC | PRN
  Administered 2023-10-23 – 2023-10-31 (×13): 1 via ORAL
  Filled 2023-10-21 (×14): qty 1

## 2023-10-21 MED ORDER — DOCUSATE SODIUM 100 MG PO CAPS: 100.0000 mg | ORAL_CAPSULE | Freq: Two times a day (BID) | ORAL | Status: DC

## 2023-10-21 MED ORDER — POLYETHYLENE GLYCOL 3350 17 G PO PACK
17.0000 g | PACK | Freq: Every day | ORAL | Status: DC | PRN
  Administered 2023-10-28: 17 g via ORAL
  Filled 2023-10-21: qty 1

## 2023-10-21 MED ORDER — TIOTROPIUM BROMIDE MONOHYDRATE 18 MCG IN CAPS: 18.0000 ug | ORAL_CAPSULE | Freq: Every day | RESPIRATORY_TRACT | Status: DC

## 2023-10-21 MED ORDER — METHOCARBAMOL 1000 MG/10ML IJ SOLN: 500.0000 mg | Freq: Four times a day (QID) | INTRAMUSCULAR | Status: DC | PRN

## 2023-10-21 MED ORDER — LACTATED RINGERS IV BOLUS
500.0000 mL | Freq: Once | INTRAVENOUS | Status: AC
  Administered 2023-10-21: 500 mL via INTRAVENOUS

## 2023-10-21 MED ORDER — FLUTICASONE FUROATE-VILANTEROL 200-25 MCG/ACT IN AEPB
1.0000 | INHALATION_SPRAY | Freq: Every day | RESPIRATORY_TRACT | Status: DC
  Administered 2023-10-22: 1 via RESPIRATORY_TRACT
  Filled 2023-10-21: qty 28

## 2023-10-21 MED ORDER — CEFAZOLIN SODIUM-DEXTROSE 2-4 GM/100ML-% IV SOLN
2.0000 g | INTRAVENOUS | Status: DC
Start: 2023-10-22 — End: 2023-10-23
  Filled 2023-10-21: qty 100

## 2023-10-21 MED ORDER — IOHEXOL 350 MG/ML SOLN
60.0000 mL | Freq: Once | INTRAVENOUS | Status: AC | PRN
  Administered 2023-10-21: 60 mL via INTRAVENOUS

## 2023-10-21 MED ORDER — CHLORHEXIDINE GLUCONATE 4 % EX SOLN
60.0000 mL | Freq: Once | CUTANEOUS | Status: DC
  Filled 2023-10-21: qty 60

## 2023-10-21 MED ORDER — HYDROMORPHONE HCL 1 MG/ML IJ SOLN
0.5000 mg | INTRAMUSCULAR | Status: DC | PRN
  Administered 2023-10-21 – 2023-10-25 (×13): 0.5 mg via INTRAVENOUS
  Filled 2023-10-21 (×13): qty 0.5

## 2023-10-21 MED ORDER — FENTANYL CITRATE PF 50 MCG/ML IJ SOSY
25.0000 ug | PREFILLED_SYRINGE | INTRAMUSCULAR | Status: AC | PRN
  Administered 2023-10-21 (×3): 25 ug via INTRAVENOUS
  Filled 2023-10-21 (×3): qty 1

## 2023-10-21 MED ORDER — FENTANYL CITRATE PF 50 MCG/ML IJ SOSY
50.0000 ug | PREFILLED_SYRINGE | Freq: Once | INTRAMUSCULAR | Status: AC
  Administered 2023-10-21: 50 ug via INTRAVENOUS
  Filled 2023-10-21: qty 1

## 2023-10-21 MED ORDER — ALBUTEROL SULFATE (2.5 MG/3ML) 0.083% IN NEBU: 2.5000 mg | INHALATION_SOLUTION | RESPIRATORY_TRACT | Status: DC | PRN

## 2023-10-21 MED ORDER — UMECLIDINIUM BROMIDE 62.5 MCG/ACT IN AEPB
1.0000 | INHALATION_SPRAY | Freq: Every day | RESPIRATORY_TRACT | Status: DC
  Administered 2023-10-22: 1 via RESPIRATORY_TRACT
  Filled 2023-10-21: qty 7

## 2023-10-21 MED ORDER — PANTOPRAZOLE SODIUM 40 MG PO TBEC
40.0000 mg | DELAYED_RELEASE_TABLET | Freq: Two times a day (BID) | ORAL | Status: DC
  Administered 2023-10-22 – 2023-10-31 (×16): 40 mg via ORAL
  Filled 2023-10-21 (×19): qty 1

## 2023-10-21 MED ORDER — ENSURE PLUS HIGH PROTEIN PO LIQD
237.0000 mL | Freq: Two times a day (BID) | ORAL | Status: DC
  Administered 2023-10-25 – 2023-10-31 (×3): 237 mL via ORAL

## 2023-10-21 MED ORDER — METHOCARBAMOL 500 MG PO TABS
500.0000 mg | ORAL_TABLET | Freq: Four times a day (QID) | ORAL | Status: DC | PRN
  Administered 2023-10-21: 500 mg via ORAL
  Filled 2023-10-21: qty 1

## 2023-10-21 MED ORDER — SODIUM CHLORIDE 0.9 % IV SOLN
100.0000 mg | Freq: Two times a day (BID) | INTRAVENOUS | Status: DC
  Administered 2023-10-21 – 2023-10-26 (×10): 100 mg via INTRAVENOUS
  Filled 2023-10-21 (×12): qty 100

## 2023-10-21 MED ORDER — DOXYCYCLINE HYCLATE 100 MG PO TABS: 100.0000 mg | ORAL_TABLET | Freq: Two times a day (BID) | ORAL | Status: DC

## 2023-10-21 MED ORDER — POVIDONE-IODINE 10 % EX SWAB
2.0000 | Freq: Once | CUTANEOUS | Status: AC
  Administered 2023-10-22: 2 via TOPICAL

## 2023-10-21 MED ORDER — BISACODYL 5 MG PO TBEC
5.0000 mg | DELAYED_RELEASE_TABLET | Freq: Every day | ORAL | Status: DC | PRN
  Administered 2023-10-26: 5 mg via ORAL
  Filled 2023-10-21: qty 1

## 2023-10-21 MED ORDER — MONTELUKAST SODIUM 10 MG PO TABS
10.0000 mg | ORAL_TABLET | Freq: Every day | ORAL | Status: DC
  Administered 2023-10-21 – 2023-10-30 (×10): 10 mg via ORAL
  Filled 2023-10-21 (×10): qty 1

## 2023-10-21 MED ORDER — ROSUVASTATIN CALCIUM 20 MG PO TABS
40.0000 mg | ORAL_TABLET | Freq: Every day | ORAL | Status: DC
  Administered 2023-10-25 – 2023-10-31 (×7): 40 mg via ORAL
  Filled 2023-10-21 (×9): qty 2

## 2023-10-21 MED ORDER — INSULIN ASPART 100 UNIT/ML IJ SOLN
0.0000 [IU] | Freq: Three times a day (TID) | INTRAMUSCULAR | Status: DC
  Administered 2023-10-24 – 2023-10-25 (×3): 2 [IU] via SUBCUTANEOUS
  Administered 2023-10-25 (×2): 3 [IU] via SUBCUTANEOUS
  Administered 2023-10-26 – 2023-10-30 (×8): 2 [IU] via SUBCUTANEOUS
  Administered 2023-10-31: 5 [IU] via SUBCUTANEOUS

## 2023-10-21 MED ORDER — CLOPIDOGREL BISULFATE 75 MG PO TABS: 75.0000 mg | ORAL_TABLET | Freq: Every day | ORAL | Status: DC

## 2023-10-21 NOTE — Progress Notes (Signed)
   10/21/23 1108  Spiritual Encounters  Type of Visit Initial  Referral source Trauma page  Reason for visit Trauma (Level 2)  OnCall Visit No  Interventions  Spiritual Care Interventions Made Compassionate presence  Intervention Outcomes  Outcomes Awareness of support  Advance Directives (For Healthcare)  Would patient like information on creating a medical advance directive? No - Patient declined   Chaplain spiritual support services remain available as the need arises.

## 2023-10-21 NOTE — Telephone Encounter (Signed)
 Copied from CRM #[id number]. Topic: Clinical - Home Health Verbal Orders >> Oct 21, 2023  9:11 AM Larissa RAMAN wrote: Caller/Agency: Amy, PT  Callback Number: [phone] Service Requested: Physical Therapy and Skilled Nursing Frequency: 1 x 5  Any new concerns about the patient? Yes.

## 2023-10-21 NOTE — Consult Note (Signed)
 Reason for Consult:Left hip fx Referring Physician: Jerilynn Later Time called: 1119 Time at bedside: 1140   Kerry Powell is an 78 y.o. female.  HPI: Ceriah got up to intervene in an argument her kids were having when she lost her balance and fell. She had immediate left hip pain and could not get up. She was brought to the ED where x-rays showed a left hip fx and orthopedic surgery was consulted. She is currently using a RW to ambulate.  Past Medical History:  Diagnosis Date   Allergy    Anemia    Anxiety    Arthritis    BACK PAIN, CHRONIC 04/04/2007   Qualifier: Diagnosis of  By: [name], [name]    BIPOLAR AFFECTIVE DISORDER 04/04/2007   Qualifier: History of  By: [name], [name] E    CAD (coronary artery disease), native coronary artery    mild CAD with no obstructive disease by coronary CTA 06/2019   Cataract    removed both eyes   Chronic neck pain 04/30/2015   Cervical spondylosis [facility] Intermittent numbness and tingling in occipital region and headaches S/p 2 occipital nerve blocks - only temp relief no relief with gabapentin, ultram , brace MRI neck - facet arthrosis    CKD (chronic kidney disease) 04/26/2015   COPD (chronic obstructive pulmonary disease) (HCC)    pt denies - told this while was smoking -   Cough 04/26/2015   Depression 07/01/2017   Diabetes (HCC) 04/27/2015   Diagnosed 03/2015    Dizziness 06/10/2017   Dysphagia 04/26/2015   DYSPNEA 12/24/2007   Qualifier: Diagnosis of  By: [name]     Elevated TSH 08/09/2015   GASTRIC ULCER, ACUTE 04/07/2007   Annotation: EGD 04/07/07 Qualifier: Diagnosis of  By: [name], [name] E    GASTRITIS 04/07/2007   Annotation: EGD 04/07/07 Qualifier: Diagnosis of  By: [name], [name] E    GERD (gastroesophageal reflux disease)    H/O renal cell carcinoma 05/23/2017   Dx 2010, s/p cryoablation Follows with urology - Dr [name]   HIATAL HERNIA 04/07/2007   Annotation: EGD 04/07/07 Qualifier:  Diagnosis of  By: [name], [name] E    History of CVA (cerebrovascular accident) 04/26/2015   History of gout 04/26/2015   Hyperlipidemia    Hypertension    Hypoxia 03/06/2023   Irritable bowel syndrome 04/04/2007   Qualifier: Diagnosis of  By: [name], [name] E    Kidney tumor (benign)    Nausea and vomiting 09/09/2017   Occipital neuralgia 05/09/2016   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 Dr [name] in [place]   OSTEOARTHRITIS 04/04/2007   Qualifier: Diagnosis of  By: [name], [name]    Personal history of colonic adenoma 06/07/2012   POSTNASAL DRIP SYNDROME 12/29/2007   Qualifier: Diagnosis of  By: [name], [name]     RESTLESS LEG SYNDROME 04/04/2007   Qualifier: Diagnosis of  By: [name], [name]    RHINOSINUSITIS, 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   Qualifier: Diagnosis of  By: [name], [name]    Sleep difficulties 09/09/2017   Snoring 04/26/2015   Stroke (HCC)    x2 2011   Thoracic aortic aneurysm (HCC) 02/06/2018   Seen on MRI 01/25/18 of thoracic spine ordered by [facility] -- referred to CTS to monitor  -- 3.5 cm   TIA (transient ischemic attack)    TOBACCO ABUSE 12/17/2007   Qualifier: Diagnosis of  ByBETHA Cave MD, [name]     Trigeminal neuralgia of right side of face 02/08/2016   URI (upper respiratory infection) 02/08/2016   Vitamin D  deficiency 03/12/2018    Past Surgical History:  Procedure Laterality Date   ABDOMINAL HYSTERECTOMY     APPENDECTOMY     APPLICATION OF WOUND VAC Left 10/09/2023   Procedure: APPLICATION, WOUND VAC;  Surgeon: [name], MD;  Location: [facility] OR;  Service: Orthopedics;  Laterality: Left;   CATARACT EXTRACTION     COLONOSCOPY     CYSTOCELE REPAIR     ESOPHAGOGASTRODUODENOSCOPY     FRACTURE SURGERY  2007   INCISION AND DRAINAGE OF DEEP ABSCESS, CALF Left 10/09/2023   Procedure: INCISION AND DRAINAGE OF DEEP ABSCESS, CALF;  Surgeon: [name], MD;  Location: [facility] OR;  Service: Orthopedics;  Laterality: Left;    KIDNEY SURGERY     benign tumor removal   ORTHOPEDIC SURGERY     Wrist & elbow   POLYPECTOMY     RECTOCELE REPAIR     TONSILECTOMY, ADENOIDECTOMY, BILATERAL MYRINGOTOMY AND TUBES Bilateral 1968   UPPER GASTROINTESTINAL ENDOSCOPY      Family History  Problem Relation Age of Onset   Congestive Heart Failure Mother    Cancer Mother        ovarian   Asthma Mother    Heart disease Mother    Heart disease Father    Diabetes Father    Kidney disease Father    Hyperlipidemia Father    Stroke Father    Diabetes Sister    Kidney disease Sister    Miscarriages / Stillbirths Sister    Arthritis Maternal Grandmother    Hyperlipidemia Maternal Grandmother    Hypertension Maternal Grandmother    Obesity Maternal Grandmother    Arthritis Paternal Grandfather    Hypertension Paternal Grandfather    Kidney disease Paternal Grandfather    Diabetes Paternal Aunt    Diabetes Paternal Uncle    Learning disabilities Brother    Anxiety disorder Son    Diabetes Son    Learning disabilities Son    Depression Maternal Aunt    Colon cancer Neg Hx    Colon polyps Neg Hx    Esophageal cancer Neg Hx    Rectal cancer Neg Hx    Stomach cancer Neg Hx     Social History:  reports that she quit smoking about 14 years ago. Her smoking use included cigarettes. She started smoking about 60 years ago. She has a 46 pack-year smoking history. She has been exposed to tobacco smoke. She has never used smokeless tobacco. She reports that she does not drink alcohol  and does not use drugs.  Allergies:  Allergies  Allergen Reactions   Gabapentin Other (See Comments)    Fluid retention    Clindamycin  Rash   Sulfa Antibiotics Nausea And Vomiting    Medications: I have reviewed the patient's current medications.  Results for orders placed or performed during the hospital encounter of 10/21/23 (from the past 48 hours)  CBC     Status: Abnormal   Collection Time: 10/21/23 11:10 AM  Result Value Ref Range    WBC 15.1 (H) 4.0 - 10.5 K/uL   RBC 4.80 3.87 - 5.11 MIL/uL   Hemoglobin 14.6 12.0 - 15.0 g/dL   HCT 54.1 63.9 - 53.9 %   MCV 95.4 80.0 - 100.0 fL   MCH 30.4 26.0 - 34.0 pg   MCHC 31.9 30.0 - 36.0 g/dL  RDW 14.2 11.5 - 15.5 %   Platelets 304 150 - 400 K/uL   nRBC 0.0 0.0 - 0.2 %    Comment: Performed at [facility], [address], [place], [name] [address]  Protime-INR     Status: None   Collection Time: 10/21/23 11:10 AM  Result Value Ref Range   Prothrombin Time 14.0 11.4 - 15.2 seconds   INR 1.0 0.8 - 1.2    Comment: (NOTE) INR goal varies based on device and disease states. Performed at [facility], [address], [place], [name] [address]   I-Stat Chem 8, ED     Status: Abnormal   Collection Time: 10/21/23 11:21 AM  Result Value Ref Range   Sodium 139 135 - 145 mmol/L   Potassium 4.3 3.5 - 5.1 mmol/L   Chloride 107 98 - 111 mmol/L   BUN 25 (H) 8 - 23 mg/dL   Creatinine, Ser 7.99 (H) 0.44 - 1.00 mg/dL   Glucose, Bld 853 (H) 70 - 99 mg/dL    Comment: Glucose reference range applies only to samples taken after fasting for at least 8 hours.   Calcium , Ion 1.09 (L) 1.15 - 1.40 mmol/L   TCO2 22 22 - 32 mmol/L   Hemoglobin 16.0 (H) 12.0 - 15.0 g/dL   HCT 52.9 (H) 63.9 - 53.9 %  I-Stat Lactic Acid, ED     Status: None   Collection Time: 10/21/23 11:22 AM  Result Value Ref Range   Lactic Acid, Venous 1.1 0.5 - 1.9 mmol/L    No results found.  Review of Systems  HENT:  Negative for ear discharge, ear pain, hearing loss and tinnitus.   Eyes:  Negative for photophobia and pain.  Respiratory:  Negative for cough and shortness of breath.   Cardiovascular:  Negative for chest pain.  Gastrointestinal:  Negative for abdominal pain, nausea and vomiting.  Genitourinary:  Negative for dysuria, flank pain, frequency and urgency.  Musculoskeletal:  Positive for arthralgias (Left hip). Negative for back pain, myalgias and neck pain.  Neurological:  Negative for  dizziness and headaches.  Hematological:  Does not bruise/bleed easily.  Psychiatric/Behavioral:  The patient is not nervous/anxious.    Blood pressure 90/66, pulse 72, temperature (!) 97.5 F (36.4 C), temperature source Oral, resp. rate 17, SpO2 96%. Physical Exam Constitutional:      General: She is not in acute distress.    Appearance: She is well-developed. She is not diaphoretic.  HENT:     Head: Normocephalic and atraumatic.  Eyes:     General: No scleral icterus.       Right eye: No discharge.        Left eye: No discharge.     Conjunctiva/sclera: Conjunctivae normal.  Cardiovascular:     Rate and Rhythm: Normal rate and regular rhythm.  Pulmonary:     Effort: Pulmonary effort is normal. No respiratory distress.  Musculoskeletal:     Cervical back: Normal range of motion.     Comments: LLE No traumatic wounds, ecchymosis, or rash  Incision lower leg C/D/I. Severe TTP hip.  No knee or ankle effusion  Knee stable to varus/ valgus and anterior/posterior stress  Sens DPN, SPN, TN intact  Motor EHL, ext, flex, evers 5/5  DP 2+, PT 2+, No significant edema  Skin:    General: Skin is warm and dry.  Neurological:     Mental Status: She is alert.  Psychiatric:  Mood and Affect: Mood normal.        Behavior: Behavior normal.     Assessment/Plan: Left hip fx -- Plan IMN tomorrow with Dr. Vernetta. Please keep NPO after MN.    Ozell DOROTHA Ned, PA-C Orthopedic Surgery [phone] 10/21/2023, 11:57 AM

## 2023-10-21 NOTE — Telephone Encounter (Signed)
 Okay for orders?

## 2023-10-21 NOTE — Progress Notes (Signed)
 Last dose of plavix  was yesterday and have held her plavix  currently for tentative surgery in am.

## 2023-10-21 NOTE — H&P (Addendum)
 History and Physical    Patient: Kerry Powell FMW:[id number] DOB: [birth year] DOA: 10/21/2023 DOS: the patient was seen and examined on 10/21/2023 . PCP: Geofm Glade PARAS, MD  Patient coming from: Home Chief complaint: Chief Complaint  Patient presents with   Fall   HPI:  Kerry Powell is a 78 y.o. female with past medical history  of  DM-2, CKD stage IIIb, chronic HFpEF, COPD presents status post ground-level fall ,Per report pt has a mechanical fall and did strike her head on dresser and fell on her left hip . Pt was discharged on October 13, 2023 after being treated for sepsis and left lower extremity purulent cellulitis and abscess that had to be debrided by Dr. Harden August 13.  Patient was discharged on 3 weeks of antibiotic regimen plan and discharged on doxycycline .  Patient at bedside is in pain uncomfortable and wants to get something for her pain and also wants to use the bathroom.  ED Course:  Vital signs in the ED were notable for the following:  Vitals:   10/21/23 1108 10/21/23 1115 10/21/23 1506 10/21/23 1608  BP:   110/74 122/65  Pulse:   82 78  Temp:   97.9 F (36.6 C)   Resp:   18 18  Height:  5' 5 (1.651 m)    Weight:  88.5 kg    SpO2: 96%  97% 96%  TempSrc:   Oral Oral  BMI (Calculated):  32.45    >>ED evaluation thus far shows: CMP shows glucose of 147 bicarb of 20 CKD with a creatinine of 1.97 EGFR 27 normal LFTs lactic acid of 1.1. CBC shows white count of 15.1 normal hemoglobin and normal platelets. CT head/C-spine shows a large left-sided scalp hematoma, no other evidence of acute intracranial abnormality, chronic small vessel ischemic disease with chronic lacunar infarcts. CT abdomen and pelvis shows impacted angulated intertrochanteric fracture of the left femur along with multiple subacute anterolateral left rib fractures, irregular mural thrombus large burden throughout the descending thoracic aorta and CAD.  >>While in the ED patient received the  following: Medications  fentaNYL  (SUBLIMAZE ) injection 25 mcg (25 mcg Intravenous Given 10/21/23 1246)  lactated ringers  bolus 500 mL (500 mLs Intravenous New Bag/Given 10/21/23 1157)  iohexol  (OMNIPAQUE ) 350 MG/ML injection 60 mL (60 mLs Intravenous Contrast Given 10/21/23 1150)  fentaNYL  (SUBLIMAZE ) injection 50 mcg (50 mcg Intravenous Given 10/21/23 1155)   Review of Systems  Unable to perform ROS: Acuity of condition   Past Medical History:  Diagnosis Date   Allergy    Anemia    Anxiety    Arthritis    BACK PAIN, CHRONIC 04/04/2007   Qualifier: Diagnosis of  By: [name], [name]    BIPOLAR AFFECTIVE DISORDER 04/04/2007   Qualifier: History of  By: [name], [name] E    CAD (coronary artery disease), native coronary artery    mild CAD with no obstructive disease by coronary CTA 06/2019   Cataract    removed both eyes   Chronic neck pain 04/30/2015   Cervical spondylosis [facility] Intermittent numbness and tingling in occipital region and headaches S/p 2 occipital nerve blocks - only temp relief no relief with gabapentin, ultram , brace MRI neck - facet arthrosis    CKD (chronic kidney disease) 04/26/2015   COPD (chronic obstructive pulmonary disease) (HCC)    pt denies - told this while was smoking -   Cough 04/26/2015   Depression 07/01/2017   Diabetes (HCC)  04/27/2015   Diagnosed 03/2015    Dizziness 06/10/2017   Dysphagia 04/26/2015   DYSPNEA 12/24/2007   Qualifier: Diagnosis of  By: [name]     Elevated TSH 08/09/2015   GASTRIC ULCER, ACUTE 04/07/2007   Annotation: EGD 04/07/07 Qualifier: Diagnosis of  By: [name], NOLIA Pitts E    GASTRITIS 04/07/2007   Annotation: EGD 04/07/07 Qualifier: Diagnosis of  By: [name], NOLIA Pitts E    GERD (gastroesophageal reflux disease)    H/O renal cell carcinoma 05/23/2017   Dx 2010, s/p cryoablation Follows with urology - Dr [name]   HIATAL HERNIA 04/07/2007   Annotation: EGD 04/07/07 Qualifier: Diagnosis of  By:  [name], NOLIA Pitts E    History of CVA (cerebrovascular accident) 04/26/2015   History of gout 04/26/2015   Hyperlipidemia    Hypertension    Hypoxia 03/06/2023   Irritable bowel syndrome 04/04/2007   Qualifier: Diagnosis of  By: [name], NOLIA Pitts BRAVO    Kidney tumor (benign)    Nausea and vomiting 09/09/2017   Occipital neuralgia 05/09/2016   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 Dr [name] in [place]   OSTEOARTHRITIS 04/04/2007   Qualifier: Diagnosis of  By: [name], NOLIA Pitts BRAVO    Personal history of colonic adenoma 06/07/2012   POSTNASAL DRIP SYNDROME 12/29/2007   Qualifier: Diagnosis of  By: [name], [name]     RESTLESS LEG SYNDROME 04/04/2007   Qualifier: Diagnosis of  By: [name], NOLIA Pitts BRAVO    RHINOSINUSITIS, 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   Qualifier: Diagnosis of  By: [name], NOLIA Pitts BRAVO    Sleep difficulties 09/09/2017   Snoring 04/26/2015   Stroke (HCC)    x2 2011   Thoracic aortic aneurysm (HCC) 02/06/2018   Seen on MRI 01/25/18 of thoracic spine ordered by [facility] -- referred to CTS to monitor  -- 3.5 cm   TIA (transient ischemic attack)    TOBACCO ABUSE 12/17/2007   Qualifier: Diagnosis of  By: [name], [name]     Trigeminal neuralgia of right side of face 02/08/2016   URI (upper respiratory infection) 02/08/2016   Vitamin D  deficiency 03/12/2018   Past Surgical History:  Procedure Laterality Date   ABDOMINAL HYSTERECTOMY     APPENDECTOMY     APPLICATION OF WOUND VAC Left 10/09/2023   Procedure: APPLICATION, WOUND VAC;  Surgeon: [name], MD;  Location: [facility] OR;  Service: Orthopedics;  Laterality: Left;   CATARACT EXTRACTION     COLONOSCOPY     CYSTOCELE REPAIR     ESOPHAGOGASTRODUODENOSCOPY     FRACTURE SURGERY  2007   INCISION AND DRAINAGE OF DEEP ABSCESS, CALF Left 10/09/2023   Procedure: INCISION AND DRAINAGE OF DEEP ABSCESS, CALF;  Surgeon: [name], MD;  Location: [facility] OR;  Service: Orthopedics;  Laterality: Left;   KIDNEY SURGERY      benign tumor removal   ORTHOPEDIC SURGERY     Wrist & elbow   POLYPECTOMY     RECTOCELE REPAIR     TONSILECTOMY, ADENOIDECTOMY, BILATERAL MYRINGOTOMY AND TUBES Bilateral 1968   UPPER GASTROINTESTINAL ENDOSCOPY      reports that she quit smoking about 14 years ago. Her smoking use included cigarettes. She started smoking about 60 years ago. She has a 46 pack-year smoking history. She has been exposed to tobacco smoke. She has never used smokeless tobacco. She reports that she does not drink alcohol  and does not use drugs. Allergies  Allergen Reactions  Gabapentin Other (See Comments)    Fluid retention    Clindamycin  Rash   Sulfa Antibiotics Nausea And Vomiting   Family History  Problem Relation Age of Onset   Congestive Heart Failure Mother    Cancer Mother        ovarian   Asthma Mother    Heart disease Mother    Heart disease Father    Diabetes Father    Kidney disease Father    Hyperlipidemia Father    Stroke Father    Diabetes Sister    Kidney disease Sister    Miscarriages / [place] Sister    Arthritis Maternal Grandmother    Hyperlipidemia Maternal Grandmother    Hypertension Maternal Grandmother    Obesity Maternal Grandmother    Arthritis Paternal Grandfather    Hypertension Paternal Grandfather    Kidney disease Paternal Grandfather    Diabetes Paternal Aunt    Diabetes Paternal Uncle    Learning disabilities Brother    Anxiety disorder Son    Diabetes Son    Learning disabilities Son    Depression Maternal Aunt    Colon cancer Neg Hx    Colon polyps Neg Hx    Esophageal cancer Neg Hx    Rectal cancer Neg Hx    Stomach cancer Neg Hx    Prior to Admission medications   Medication Sig Start Date End Date Taking? Authorizing Provider  albuterol  (PROVENTIL ) (2.5 MG/3ML) 0.083% nebulizer solution USE 1 VIAL IN NEBULIZER EVERY 6 HOURS AND AS NEEDED FOR SHORT BREATH/WHEEZE.  Generic:  Ventolin  09/30/23   Meade Verdon RAMAN, MD  allopurinol  (ZYLOPRIM ) 300 MG  tablet Take 1 tablet by mouth once daily 09/02/23   Geofm Glade PARAS, MD  amLODipine  (NORVASC ) 5 MG tablet Take 1 tablet (5 mg total) by mouth daily. 10/16/23   Geofm Glade PARAS, MD  budesonide -formoterol  (SYMBICORT ) 160-4.5 MCG/ACT inhaler Inhale 2 puffs into the lungs 2 (two) times daily. 10/13/23   Ghimire, Donalda HERO, MD  cetirizine  (ZYRTEC  ALLERGY) 10 MG tablet Take 10 mg by mouth daily as needed for allergies or rhinitis. 05/06/19   [provider]  cholecalciferol (VITAMIN D3) 25 MCG (1000 UNIT) tablet Take 2,000 Units by mouth daily.    [provider]  clopidogrel  (PLAVIX ) 75 MG tablet Take 1 tablet by mouth once daily 09/02/23   Geofm Glade PARAS, MD  cyclobenzaprine  (FLEXERIL ) 5 MG tablet Take 1 tablet (5 mg total) by mouth 3 (three) times daily as needed. for muscle spams 03/31/23   Geofm Glade PARAS, MD  doxycycline  (VIBRA -TABS) 100 MG tablet Take 1 tablet (100 mg total) by mouth 2 (two) times daily for 16 days. 10/13/23 10/29/23  Ghimire, Donalda HERO, MD  DULoxetine  (CYMBALTA ) 30 MG capsule Take 2 capsules (60 mg total) by mouth daily. 10/16/23   Geofm Glade PARAS, MD  FARXIGA  10 MG TABS tablet Take 1 tablet (10 mg total) by mouth daily. Take 10 mg by mouth daily. 03/21/23   Geofm Glade PARAS, MD  ferrous sulfate  325 (65 FE) MG tablet Take 325 mg by mouth every other day.    [provider]  fluticasone  (FLONASE ) 50 MCG/ACT nasal spray Place 2 sprays into both nostrils daily. Patient taking differently: Place 2 sprays into both nostrils daily as needed for allergies. 10/03/23   Soldatova, Liuba, MD  furosemide  (LASIX ) 20 MG tablet Take 1 tablet (20 mg total) by mouth daily as needed for fluid or edema. 03/06/23   Geofm Glade PARAS, MD  metoprolol  tartrate (LOPRESSOR ) 25 MG tablet Take 1 tablet (25 mg total) by mouth 2 (two) times daily. May take extra 25 mg tablet for as needed palpitations 02/12/23   Odell Celinda Balo, MD  montelukast  (SINGULAIR ) 10 MG tablet TAKE 1 TABLET BY MOUTH ONCE DAILY AT  BEDTIME 07/25/23   Burns, Glade PARAS, MD  MOUNJARO  5 MG/0.5ML Pen INJECT 1/2 (ONE-HALF) ML SUBCUTANEOUSLY  ONCE A WEEK 09/23/23   Burns, Glade PARAS, MD  oxyCODONE  (OXY IR/ROXICODONE ) 5 MG immediate release tablet Take 1 tablet (5 mg total) by mouth every 6 (six) hours as needed for moderate pain (pain score 4-6). 10/13/23   Ghimire, Donalda HERO, MD  pantoprazole  (PROTONIX ) 40 MG tablet Take 1 tablet (40 mg total) by mouth 2 (two) times daily before a meal. 02/01/23   Burns, Glade PARAS, MD  rosuvastatin  (CRESTOR ) 40 MG tablet Take 1 tablet by mouth once daily 09/02/23   Shlomo Wilbert SAUNDERS, MD  sodium chloride  (OCEAN) 0.65 % SOLN nasal spray Place 1 spray into both nostrils as needed for congestion. 07/29/23   Meade Verdon RAMAN, MD  telmisartan  (MICARDIS ) 40 MG tablet Take 1 tablet (40 mg total) by mouth daily. 08/26/23   Geofm Glade PARAS, MD  tiotropium (SPIRIVA  HANDIHALER) 18 MCG inhalation capsule Place 1 capsule (18 mcg total) into inhaler and inhale daily. 10/13/23 10/12/24  Ghimire, Donalda HERO, MD  traMADol  (ULTRAM ) 50 MG tablet Take 50 mg by mouth 3 (three) times daily as needed. for pain 09/18/23   [provider]                                                                                 Vitals:   10/21/23 1108 10/21/23 1115 10/21/23 1506 10/21/23 1608  BP:   110/74 122/65  Pulse:   82 78  Resp:   18 18  Temp:   97.9 F (36.6 C)   TempSrc:   Oral Oral  SpO2: 96%  97% 96%  Weight:  88.5 kg    Height:  5' 5 (1.651 m)     Physical Exam Vitals reviewed.  Constitutional:      General: She is not in acute distress.    Appearance: She is obese. She is not ill-appearing.  HENT:     Head: Normocephalic.  Eyes:     Extraocular Movements: Extraocular movements intact.  Cardiovascular:     Rate and Rhythm: Normal rate and regular rhythm.     Pulses: Normal pulses.     Heart sounds: Normal heart sounds.  Pulmonary:     Breath sounds: Normal breath sounds.  Abdominal:     General: There is no  distension.     Palpations: Abdomen is soft.     Tenderness: There is no abdominal tenderness.  Musculoskeletal:        General: Tenderness present.     Right lower leg: No edema.     Left lower leg: No edema.  Neurological:     General: No focal deficit present.     Mental Status: She is alert and oriented to person, place, and time.     Labs on Admission: I have personally reviewed following labs and  imaging studies CBC: Recent Labs  Lab 10/16/23 1530 10/21/23 1110 10/21/23 1121  WBC 9.6 15.1*  --   NEUTROABS 6.0  --   --   HGB 15.7* 14.6 16.0*  HCT 48.8* 45.8 47.0*  MCV 94.3 95.4  --   PLT 333.0 304  --    Basic Metabolic Panel: Recent Labs  Lab 10/16/23 1530 10/21/23 1110 10/21/23 1121  NA 137 137 139  K 4.3 4.2 4.3  CL 98 109 107  CO2 32 20*  --   GLUCOSE 116* 147* 146*  BUN 24* 22 25*  CREATININE 1.87* 1.97* 2.00*  CALCIUM  9.1 9.1  --    GFR: Estimated Creatinine Clearance: 25.5 mL/min (A) (by C-G formula based on SCr of 2 mg/dL (H)). Liver Function Tests: Recent Labs  Lab 10/16/23 1530 10/21/23 1110  AST 17 20  ALT 17 16  ALKPHOS 96 79  BILITOT 0.7 0.8  PROT 7.7 7.1  ALBUMIN  3.8 3.1*   No results for input(s): LIPASE, AMYLASE in the last 168 hours. No results for input(s): AMMONIA in the last 168 hours. Recent Labs    10/06/23 1908 10/07/23 0405 10/08/23 0529 10/09/23 0415 10/10/23 0518 10/11/23 0538 10/12/23 0657 10/16/23 1530 10/21/23 1110 10/21/23 1121  BUN 21 21 20 21  25* 33* 35* 24* 22 25*  CREATININE 1.93* 1.97* 2.15* 1.92* 1.80* 1.75* 2.10* 1.87* 1.97* 2.00*    Estimated Creatinine Clearance: 25.5 mL/min (A) (by C-G formula based on SCr of 2 mg/dL (H)).   Recent Labs    06/05/23 1520 10/06/23 1908 10/07/23 0405 10/08/23 0529 10/09/23 0415 10/10/23 0518 10/11/23 0538 10/12/23 0657 10/16/23 1530 10/21/23 1110 10/21/23 1121  BUN 29* 21 21 20 21  25* 33* 35* 24* 22 25*  CREATININE 1.85* 1.93* 1.97* 2.15* 1.92*  1.80* 1.75* 2.10* 1.87* 1.97* 2.00*  CO2 30 19* 23 24 21* 22 24 26  32 20*  --    Cardiac Enzymes: No results for input(s): CKTOTAL, CKMB, CKMBINDEX, TROPONINI in the last 168 hours. BNP (last 3 results) No results for input(s): PROBNP in the last 8760 hours. HbA1C: No results for input(s): HGBA1C in the last 72 hours. CBG: No results for input(s): GLUCAP in the last 168 hours. Lipid Profile: No results for input(s): CHOL, HDL, LDLCALC, TRIG, CHOLHDL, LDLDIRECT in the last 72 hours. Thyroid  Function Tests: No results for input(s): TSH, T4TOTAL, FREET4, T3FREE, THYROIDAB in the last 72 hours. Anemia Panel: No results for input(s): VITAMINB12, FOLATE, FERRITIN, TIBC, IRON, RETICCTPCT in the last 72 hours. Urine analysis:    Component Value Date/Time   COLORURINE YELLOW 10/08/2023 1257   APPEARANCEUR CLEAR 10/08/2023 1257   LABSPEC 1.006 10/08/2023 1257   PHURINE 6.0 10/08/2023 1257   GLUCOSEU >=500 (A) 10/08/2023 1257   GLUCOSEU >=1000 (A) 05/15/2022 1554   HGBUR NEGATIVE 10/08/2023 1257   BILIRUBINUR NEGATIVE 10/08/2023 1257   BILIRUBINUR negative 09/25/2022 1027   KETONESUR NEGATIVE 10/08/2023 1257   PROTEINUR NEGATIVE 10/08/2023 1257   UROBILINOGEN 0.2 09/25/2022 1027   UROBILINOGEN 0.2 05/15/2022 1554   NITRITE NEGATIVE 10/08/2023 1257   LEUKOCYTESUR NEGATIVE 10/08/2023 1257   Radiological Exams on Admission: DG Chest Port 1 View Result Date: 10/21/2023 CLINICAL DATA:  Trauma, fall EXAM: PORTABLE CHEST 1 VIEW COMPARISON:  10/06/2023 FINDINGS: Underpenetrated AP portable chest radiograph. Mild cardiomegaly. No acute abnormality of the lungs. No acute osseous findings. IMPRESSION: Underpenetrated AP portable chest radiograph. Mild cardiomegaly. No acute abnormality of the lungs. Electronically Signed   By: Marolyn JONETTA Marlyce CHRISTELLA.D.  On: 10/21/2023 12:36   DG Pelvis Portable Result Date: 10/21/2023 CLINICAL DATA:  Trauma, fall EXAM:  PORTABLE PELVIS 1-2 VIEWS COMPARISON:  None Available. FINDINGS: Impacted, angulated intratrochanteric fractures of the left femur. No displaced fracture of the pelvis or proximal right femur in single frontal view. IMPRESSION: 1. Impacted, angulated intratrochanteric fractures of the left femur. 2. No displaced fracture of the pelvis or proximal right femur in single frontal view. Electronically Signed   By: Marolyn JONETTA Jaksch M.D.   On: 10/21/2023 12:33   DG Elbow Complete Left Result Date: 10/21/2023 CLINICAL DATA:  Fall, pain EXAM: LEFT ELBOW - COMPLETE 3+ VIEW COMPARISON:  None Available. FINDINGS: No displaced fracture or dislocation of the left elbow. Obliquity of lateral view provided for review limits assessment for effusion. No obvious effusion. Soft tissues unremarkable. IMPRESSION: No displaced fracture or dislocation of the left elbow. Obliquity of lateral view provided for review limits assessment for effusion. No obvious effusion. Electronically Signed   By: Marolyn JONETTA Jaksch M.D.   On: 10/21/2023 12:28   CT CHEST ABDOMEN PELVIS W CONTRAST Result Date: 10/21/2023 CLINICAL DATA:  Trauma, fall EXAM: CT CHEST, ABDOMEN, AND PELVIS WITH CONTRAST TECHNIQUE: Multidetector CT imaging of the chest, abdomen and pelvis was performed following the standard protocol during bolus administration of intravenous contrast. RADIATION DOSE REDUCTION: This exam was performed according to the departmental dose-optimization program which includes automated exposure control, adjustment of the mA and/or kV according to patient size and/or use of iterative reconstruction technique. CONTRAST:  60mL OMNIPAQUE  IOHEXOL  350 MG/ML SOLN COMPARISON:  None Available. FINDINGS: CT CHEST FINDINGS Cardiovascular: Severe aortic atherosclerosis with a large burden of irregular mural thrombus particularly throughout the descending vessel. Normal heart size. Three-vessel coronary artery calcifications. No pericardial effusion. Mediastinum/Nodes:  No enlarged mediastinal, hilar, or axillary lymph nodes. Thyroid  gland, trachea, and esophagus demonstrate no significant findings. Lungs/Pleura: Moderate centrilobular emphysema. No pleural effusion or pneumothorax. Musculoskeletal: No chest wall abnormality. No acute osseous findings. Multiple subacute, callused fractures of the anterolateral left ribs (series 3, image 43). CT ABDOMEN PELVIS FINDINGS Hepatobiliary: No solid liver abnormality is seen. No gallstones, gallbladder wall thickening, or biliary dilatation. Pancreas: Unremarkable. No pancreatic ductal dilatation or surrounding inflammatory changes. Spleen: Normal in size without significant abnormality. Adrenals/Urinary Tract: Adrenal glands are unremarkable. Severely atrophic left kidney. Scarring and atrophy of the posterior cortex of the right kidney. No calculi or hydronephrosis. Bladder is unremarkable. Stomach/Bowel: Stomach is within normal limits. Appendix not clearly visualized. No evidence of bowel wall thickening, distention, or inflammatory changes. Vascular/Lymphatic: Severe aortic atherosclerosis. No enlarged abdominal or pelvic lymph nodes. Reproductive: Hysterectomy. Other: No abdominal wall hernia or abnormality. No ascites. Musculoskeletal: Impacted, angulated intratrochanteric fractures of the left femur (series 3, image 120, series 6, image 55) IMPRESSION: 1. Impacted, angulated intratrochanteric fractures of the left femur. 2. Multiple subacute, callused fractures of the anterolateral left ribs. 3. No other CT evidence of acute traumatic injury to the chest, abdomen, or pelvis. 4. Severe aortic atherosclerosis with a large burden of irregular mural thrombus particularly throughout the descending thoracic aorta. 5. Coronary artery disease. Aortic Atherosclerosis (ICD10-I70.0) and Emphysema (ICD10-J43.9). Electronically Signed   By: Marolyn JONETTA Jaksch M.D.   On: 10/21/2023 12:27   CT HEAD WO CONTRAST Result Date: 10/21/2023 CLINICAL DATA:   Provided history: Head trauma, moderate/severe. Head CT Polytrauma, blunt. EXAM: CT HEAD WITHOUT CONTRAST CT CERVICAL SPINE WITHOUT CONTRAST TECHNIQUE: Multidetector CT imaging of the head and cervical spine was performed following the standard protocol without  intravenous contrast. Multiplanar CT image reconstructions of the cervical spine were also generated. RADIATION DOSE REDUCTION: This exam was performed according to the departmental dose-optimization program which includes automated exposure control, adjustment of the mA and/or kV according to patient size and/or use of iterative reconstruction technique. COMPARISON:  Head CT 01/21/2022.  Cervical spine CT 01/21/2022. FINDINGS: CT HEAD FINDINGS Brain: No age-advanced or lobar predominant cerebral atrophy. Chronic lacunar infarcts again demonstrated within the right caudate nucleus and right thalamus. Prominent perivascular space versus chronic lacunar infarct within the left lentiform nucleus, unchanged. Background mild cerebral white matter chronic small vessel ischemic disease, better appreciated on the prior brain MRI 06/09/2017. There is no acute intracranial hemorrhage. No demarcated cortical infarct. No extra-axial fluid collection. No evidence of an intracranial mass. No midline shift. Vascular: No hyperdense vessel.  Atherosclerotic calcifications. Skull: No calvarial fracture or aggressive osseous lesion. Sinuses/Orbits: No mass or acute finding within the imaged orbits. Minimal mucosal thickening within the left frontal sinus and within bilateral ethmoid air cells. Other: Large left-sided scalp hematoma. CT CERVICAL SPINE FINDINGS Alignment: Mild levocurvature of the cervical spine. Nonspecific reversal of the expected cervical doses. 2 mm C2-C3 grade 1 anterolisthesis. 4 mm C4-C5 grade 1 anterolisthesis. Slight grade 1 anterolisthesis at C5-C6, C6-C7, C7-T1, T1-T2 and T3-T4. Skull base and vertebrae: The basion-dental and atlanto-dental intervals  are maintained.No evidence of acute fracture to the cervical spine. Developmental incomplete segmentation of the T2-T3 vertebrae. Subcentimeter sclerotic focus within the left aspect of the T3 vertebral body, nonspecific but likely reflecting a bone island. Soft tissues and spinal canal: No prevertebral fluid or swelling. No visible canal hematoma. Disc levels: Cervical spondylosis with multilevel disc space narrowing, disc bulges/central disc protrusions and overall advanced facet arthropathy. No appreciable high-grade spinal canal stenosis. Multilevel bony neural foraminal narrowing. Degenerative changes also present at the atlantooccipital articulations (right greater than left) and at the C1-C2 articulation. Upper chest: No consolidation within the imaged lung apices. No visible pneumothorax. Biapical pleuroparenchymal scarring. Emphysema. Aortic atherosclerosis. IMPRESSION: CT head: 1.  No evidence of an acute intracranial abnormality. 2. Large left scalp hematoma. 3. 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 CT cervical spine: 1. No evidence of an acute cervical spine fracture. 2. Nonspecific reversal of the expected cervical lordosis. 3. Grade 1 anterolisthesis at C2-C3, C4-C5, C5-C6, C6-C7, C7-T1, T1-T2 and T3-T4. 4. Cervical spondylosis as described. 5. Aortic Atherosclerosis (ICD10-I70.0) and Emphysema (ICD10-J43.9). Electronically Signed   By: Rockey Childs D.O.   On: 10/21/2023 12:06   CT CERVICAL SPINE WO CONTRAST Result Date: 10/21/2023 CLINICAL DATA:  Provided history: Head trauma, moderate/severe. Head CT Polytrauma, blunt. EXAM: CT HEAD WITHOUT CONTRAST CT CERVICAL SPINE WITHOUT CONTRAST TECHNIQUE: Multidetector CT imaging of the head and cervical spine was performed following the standard protocol without intravenous contrast. Multiplanar CT image reconstructions of the cervical spine were also generated. RADIATION DOSE REDUCTION: This exam was  performed according to the departmental dose-optimization program which includes automated exposure control, adjustment of the mA and/or kV according to patient size and/or use of iterative reconstruction technique. COMPARISON:  Head CT 01/21/2022.  Cervical spine CT 01/21/2022. FINDINGS: CT HEAD FINDINGS Brain: No age-advanced or lobar predominant cerebral atrophy. Chronic lacunar infarcts again demonstrated within the right caudate nucleus and right thalamus. Prominent perivascular space versus chronic lacunar infarct within the left lentiform nucleus, unchanged. Background mild cerebral white matter chronic small vessel ischemic disease, better appreciated on the prior brain MRI 06/09/2017. There is no  acute intracranial hemorrhage. No demarcated cortical infarct. No extra-axial fluid collection. No evidence of an intracranial mass. No midline shift. Vascular: No hyperdense vessel.  Atherosclerotic calcifications. Skull: No calvarial fracture or aggressive osseous lesion. Sinuses/Orbits: No mass or acute finding within the imaged orbits. Minimal mucosal thickening within the left frontal sinus and within bilateral ethmoid air cells. Other: Large left-sided scalp hematoma. CT CERVICAL SPINE FINDINGS Alignment: Mild levocurvature of the cervical spine. Nonspecific reversal of the expected cervical doses. 2 mm C2-C3 grade 1 anterolisthesis. 4 mm C4-C5 grade 1 anterolisthesis. Slight grade 1 anterolisthesis at C5-C6, C6-C7, C7-T1, T1-T2 and T3-T4. Skull base and vertebrae: The basion-dental and atlanto-dental intervals are maintained.No evidence of acute fracture to the cervical spine. Developmental incomplete segmentation of the T2-T3 vertebrae. Subcentimeter sclerotic focus within the left aspect of the T3 vertebral body, nonspecific but likely reflecting a bone island. Soft tissues and spinal canal: No prevertebral fluid or swelling. No visible canal hematoma. Disc levels: Cervical spondylosis with multilevel disc  space narrowing, disc bulges/central disc protrusions and overall advanced facet arthropathy. No appreciable high-grade spinal canal stenosis. Multilevel bony neural foraminal narrowing. Degenerative changes also present at the atlantooccipital articulations (right greater than left) and at the C1-C2 articulation. Upper chest: No consolidation within the imaged lung apices. No visible pneumothorax. Biapical pleuroparenchymal scarring. Emphysema. Aortic atherosclerosis. IMPRESSION: CT head: 1.  No evidence of an acute intracranial abnormality. 2. Large left scalp hematoma. 3. 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 CT cervical spine: 1. No evidence of an acute cervical spine fracture. 2. Nonspecific reversal of the expected cervical lordosis. 3. Grade 1 anterolisthesis at C2-C3, C4-C5, C5-C6, C6-C7, C7-T1, T1-T2 and T3-T4. 4. Cervical spondylosis as described. 5. Aortic Atherosclerosis (ICD10-I70.0) and Emphysema (ICD10-J43.9). Electronically Signed   By: Rockey Childs D.O.   On: 10/21/2023 12:06   Data Reviewed: Relevant notes from primary care and specialist visits, past discharge summaries as available in EHR, including Care Everywhere . Prior diagnostic testing as pertinent to current admission diagnoses, Updated medications and problem lists for reconciliation .ED course, including vitals, labs, imaging, treatment and response to treatment,Triage notes, nursing and pharmacy notes and ED provider's notes.Notable results as noted in HPI.Discussed case with EDMD/ ED APP/ or Specialty MD on call and as needed.  Assessment & Plan  >> Fall/ Left Hip Fracture: Impacted, angulated intratrochanteric fractures of the left femur. Orthopedic consulted and npo after midnight.  Fall prevention plan upon discharge.   >> COPD: PRN albuterol  . Continue patient's Spiriva .  >>Incidental  large burden of irregular mural thrombus particularly throughout the  descending thoracic Aorta/PAD: Curbside- Vascular MD Dr. Lanis and pt to follow up with them as outpatient and start [facility] once able.  Patient is currently on Plavix  which we will continue.   >> Essential hypertension: Vitals:   10/21/23 1107 10/21/23 1506 10/21/23 1608  BP: 90/66 110/74 122/65  Hold patient's amlodipine  and metoprolol .  Hold patient's Micardis .  Hold patient's Lasix .    >> Obesity/diabetes mellitus type 2: Controlled with her most recent A1c of 6.4 will start patient on glycemic protocol n.p.o. after midnight with Accu-Cheks every 4 hourly.   >> CKD stage IIIb: Lab Results  Component Value Date   CREATININE 2.00 (H) 10/21/2023   CREATININE 1.97 (H) 10/21/2023   CREATININE 1.87 (H) 10/16/2023  We will avoid contrast study.    DVT prophylaxis:  scd Consults:  Ortho   Advance Care Planning:    Code Status: Full  Code   Family Communication:  Daughter  Disposition Plan:  TBD Severity of Illness: The appropriate patient status for this patient is INPATIENT. Inpatient status is judged to be reasonable and necessary in order to provide the required intensity of service to ensure the patient's safety. The patient's presenting symptoms, physical exam findings, and initial radiographic and laboratory data in the context of their chronic comorbidities is felt to place them at high risk for further clinical deterioration. Furthermore, it is not anticipated that the patient will be medically stable for discharge from the hospital within 2 midnights of admission.   * I certify that at the point of admission it is my clinical judgment that the patient will require inpatient hospital care spanning beyond 2 midnights from the point of admission due to high intensity of service, high risk for further deterioration and high frequency of surveillance required.*  Unresulted Labs (From admission, onward)     Start     Ordered   10/21/23 1108  Urinalysis, Routine w reflex  microscopic -Urine, Clean Catch  ([facility] ED TRAUMA PANEL [facility])  Once,   URGENT       Question:  Specimen Source  Answer:  Urine, Clean Catch   10/21/23 1109            Meds ordered this encounter  Medications   fentaNYL  (SUBLIMAZE ) injection 25 mcg   lactated ringers  bolus 500 mL   iohexol  (OMNIPAQUE ) 350 MG/ML injection 60 mL   fentaNYL  (SUBLIMAZE ) injection 50 mcg   chlorhexidine  (HIBICLENS ) 4 % liquid 4 Application   povidone-iodine  10 % swab 2 Application   ceFAZolin  (ANCEF ) IVPB 2g/100 mL premix    Indication::   Surgical Prophylaxis   OR Linked Order Group    methocarbamol  (ROBAXIN ) tablet 500 mg    methocarbamol  (ROBAXIN ) injection 500 mg   DISCONTD: docusate sodium  (COLACE) capsule 100 mg   polyethylene glycol (MIRALAX  / GLYCOLAX ) packet 17 g   bisacodyl  (DULCOLAX) EC tablet 5 mg   feeding supplement (ENSURE PLUS HIGH PROTEIN) liquid 237 mL   albuterol  (PROVENTIL ) (2.5 MG/3ML) 0.083% nebulizer solution 2.5 mg   fluticasone  furoate-vilanterol (BREO ELLIPTA ) 200-25 MCG/ACT 1 puff   clopidogrel  (PLAVIX ) tablet 75 mg   montelukast  (SINGULAIR ) tablet 10 mg   rosuvastatin  (CRESTOR ) tablet 40 mg   pantoprazole  (PROTONIX ) EC tablet 40 mg   tiotropium (SPIRIVA ) inhalation capsule ([facility] use ONLY) 18 mcg   insulin  aspart (novoLOG ) injection 0-15 Units    Correction coverage::   Moderate (average weight, post-op)    CBG < 70::   implement hypoglycemia protocol    CBG 70 - 120::   0 units    CBG 121 - 150::   2 units    CBG 151 - 200::   3 units    CBG 201 - 250::   5 units    CBG 251 - 300::   8 units    CBG 301 - 350::   11 units    CBG 351 - 400::   15 units    CBG > 400:   call MD and obtain STAT lab verification     Orders Placed This Encounter  Procedures   Critical Care   DG Chest Port 1 View   DG Pelvis Portable   CT HEAD WO CONTRAST   CT CERVICAL SPINE WO CONTRAST   CT CHEST ABDOMEN PELVIS W CONTRAST   DG Elbow Complete Left   Comprehensive metabolic panel    CBC   Ethanol  Urinalysis, Routine w reflex microscopic -Urine, Clean Catch   Protime-INR   Diet regular Room service appropriate? Yes; Fluid consistency: Thin   Diet NPO time specified Except for: [employer], Sips with Meds   Diet NPO time specified   ED Cardiac monitoring   Measure blood pressure   Initiate Carrier Fluid Protocol   Informed Consent Details: Physician/Practitioner Attestation; Transcribe to consent form and obtain patient signature   Initiate Pre-op Protocol   Preoperative MRSA Screening   Pre-admission testing diagnosis   If diabetic or glucose greater than 140 notify MD to place Glycemic Control Order Set Contact Anesthesiologist   Patient Education: Incentive Spirometry   Void on call to OR   Anesthesia Choice   SCDs   Vital signs every hour x 4, then every 4 hours   Neurovascular checks every hour x 4, then every 4 hours   Bed with an overhead patient helper or overhead frame with a trapeze bar   Apply ice to affected area   Elevate heels off of bed   Turn cough deep breathe   Incentive spirometry   Bed rest with HOB to 30 degrees   Intake and output   If diabetic or glucose greater than 140mg /dl, notify physician to place Gylcemic Control (SSI) Order Set   Initiate Oral Care Protocol   Initiate Carrier Fluid Protocol   Document Pasero Opioid-Induced Sedation Scale (POSS) per protocol (see sidebar report)   Cardiac Monitoring - Continuous Indefinite   Continue foley catheter   Apply Diabetes Mellitus Care Plan   STAT CBG when hypoglycemia is suspected. If treated, recheck every 15 minutes after each treatment until CBG >/= 70 mg/dl   Refer to Hypoglycemia Protocol Sidebar Report for treatment of CBG < 70 mg/dl   No HS correction Insulin    Full code   Consult to orthopedic surgery   Consult to anesthesiology for nerve block Laterality: Left; Nerve block will be performed in PACU.   Consult to hospitalist  Admit for AKI/medical management in setting of  fall with hip fx   Consult to Transition of Care Team   Consult to Registered Dietitian   Consult to anesthesiology for nerve block Laterality: Left; Nerve block will be performed in PACU.   ED Pulse oximetry, continuous   Oxygen therapy Mode or (Route): Nasal cannula; Liters Per Minute: 2   Pulse oximetry, continuous   Pulse oximetry, every 4 hours   I-Stat Chem 8, ED   I-Stat Lactic Acid, ED   EKG 12-Lead   Sample to Blood Bank   Insert and maintain  IV   Admit to Inpatient (patient's expected length of stay will be greater than 2 midnights or inpatient only procedure)    Author: Mario LULLA Blanch, MD 12 pm- 8 pm. [facility]. 10/21/2023 6:52 PM Please note for any communication after hours contact [facility] Assigned provider on call on Amion.

## 2023-10-21 NOTE — Plan of Care (Signed)

## 2023-10-21 NOTE — Telephone Encounter (Signed)
 Not taking tramadol  currently because she is taking hydrocodone .  Okay with interactions with Flexeril  (cyclobenzaprine ) and pain medication.  She was advised to be cautious because bums can make her drowsy.  Amlodipine  is 5 mg.

## 2023-10-21 NOTE — ED Triage Notes (Signed)
 Pt BIB [facility] from home due to mechanical fall. Pt on Plavix . C/O head and left hip pain, + deformity to left hip. C- Collar in place.   EMS 28mcg-Fentanyl   4mg - Zofran   3 liters 02 [place].

## 2023-10-21 NOTE — Telephone Encounter (Signed)
 Copied from CRM #[id number]. Topic: Clinical - Medication Question >> Oct 21, 2023  9:14 AM Larissa RAMAN wrote: Reason for CRM: Amy, PT  with [facility] states patient was discharged from hospital on 8/17 and was seen on 8/22 and had not started taking doxycycline  medication. AMY states patient started medication on 8/22. Amy states she also received notification regarding level 2 medication interaction for: Cyclobenzaprine  with Tramadol  and Cyclobenzaprine  with Hydrocodone . She is also needing to verify dosage of Amlodipine .   Callback # [phone]

## 2023-10-21 NOTE — Hospital Course (Addendum)
 T2DM CKD 3B chronic HFpEF, COPD-on home oxygen 2 L since last discharge admitted with ground-level fall. Recently admitted and discharged on August 17 after sepsis and right lower extremity cellulitis abscess treatment and was debrided by Dr. Harden.  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 lung showed moderate centrilobular emphysema no pleural effusion or pneumothorax.Orthopedics consulted. Pre-op evaluation done by cardiology and patient underwent left hip IM on 8/28. Pending SNF placement.  Subjective: Seen and examined today Resting well on 2l [place] home setting Pain controlled. Not much appetite Overnight afebrile BP stable  Assessment and plan:  Impacted angulated intertrochanteric fracture of left femur, s/p left hip IM surgery done on 8/28. Fall at home, initial encounter: Patient has undergone surgery continue multimodal pain management, PT OT at this time waiting for placement DVT prophylaxis Lovenox .  Continue multimodal pain management  PreOP eval: Cardiology consulted for preoperative clearance. Coronary CTA done on 07/02/19 showed coronary calcium  score of 390 and mild atherosclerosis.   COPD/emphysema Acute on chronic hypoxic resp failure-on 2l [place] x 3 wk: Recently discharged on supplemental oxygen. Will keep her on Pulmicort /Brovana /Yupelri  and bronchodilators as needed, I-S.  Weaned to nasal cannula at 2 L.  ABG obtained no CO2 retention pCO2 61, BNP is 95.   Recent admission for LLE purulent cellulitis/abscess/sepsis: S/p incision/debridement by Dr. Harden on 8/13 and was dced on 8/17 on 3-weeks course of oral doxycycline . Cont wound care. Stopped doxycyline on 8/31 due  to intolerance, and started augmentin .   CAD HLD PAD: holding Plavix  CT scan showing large burden of irregular mural thrombus particularly descending thoracic aorta/coronary atherosclerosis: Plavix  on hold.  Dr. Silver from [facility] was curbsided who advised outpatient follow-up and start [facility] once able Patient had preop workup -ECHO showed EF 60-65% and Grade 1 diastolic dysfunction. Lexiscan  stress test done on 8/27 showed intermediate risk. As per cardiology Given the lack of high risk stress test findings, normal ejection fraction, and planned orthopedic surgery which is not high risk from a cardiovascular perspective, would proceed with orthopedic surgery as planned Prior lexiscan  done on 03/09/2022 was unremarkable.   Hypertension: BP well-controlled .soft holding  metoprolol , telmisartan .   Chronic HFpEF: NYHA II. Not in exacerbation Imaging shows emphysema but no pleural effusion or edema.  Continue on Lasix   Net IO Since Admission: -3,092.75 mL [10/30/23 0744]    Anemia of chronic disease: Monitor hemoglobin, resumed home iron supplement   GERD: Continue PPI   Diabetes mellitus type 2 with hyperglycemia: Well-controlled, continue SSI   CKD stage IIIb: B/l creatinine around 1.9-2, stable.Avoid nephrotoxic medication monitor   Class I Obesity w/ Body mass index is 32.45 kg/m.: Will benefit with PCP follow-up, weight loss,healthy lifestyle and outpatient sleep eval if not done.   Urinary retention: In and out cath.   Class I Obesity w/ Body mass index is 32.47 kg/m.: Will benefit with PCP follow-up, weight loss,healthy lifestyle and outpatient sleep eval if not done.  Mobility: PT Orders: Active  PT Follow up Rec: Skilled Nursing-Short Term Rehab (<3 Hours/Day)10/28/2023 1322    DVT prophylaxis: enoxaparin  (LOVENOX ) injection 40 mg Start: 10/25/23 0800 SCDs Start: 10/21/23 1412 Code Status:   Code Status: Full Code Family Communication: plan of care discussed with  patient/daughter at bedside. Patient  status is: Remains hospitalized because of severity of illness Level of care: Progressive   Dispo: The patient is from: home            Anticipated disposition: TBD Objective: Vitals last 24 hrs: Vitals:   10/29/23 1931 10/29/23 1947 10/29/23 2320 10/30/23 0342  BP: 105/75  122/62 108/61  Pulse: 94  (!) 105 88  Resp: 15  19 19   Temp: 98.4 F (36.9 C)  97.8 F (36.6 C) 97.7 F (36.5 C)  TempSrc: Oral  Oral Axillary  SpO2: 95% 94% 97% 92%  Weight:      Height:        Physical Examination: General exam: AAOX3 HEENT:Oral mucosa moist, Ear/Nose WNL grossly Respiratory system: B/l clear Cardiovascular system: S1 & S2 +, No JVD. Gastrointestinal system: Abdomen soft,NT,ND, BS+ Nervous System: Alert, awake, moving all extremities,and following commands. Extremities: LE edema neg, distal extremities warm.  Skin: No rashes,no icterus. MSK: Left hip surgical site dressing  c/d/i  Medications reviewed:  Scheduled Meds:  amoxicillin -clavulanate  1 tablet Oral Q12H   arformoterol   15 mcg Nebulization BID   budesonide  (PULMICORT ) nebulizer solution  0.25 mg Nebulization BID   docusate sodium   100 mg Oral BID   enoxaparin  (LOVENOX ) injection  40 mg Subcutaneous Q24H   famotidine   20 mg Oral Daily   feeding supplement  237 mL Oral BID BM   ferrous sulfate   325 mg Oral QODAY   furosemide   20 mg Oral QODAY   insulin  aspart  0-15 Units Subcutaneous TID WC   methocarbamol   500 mg Oral QID   montelukast   10 mg Oral QHS   multivitamin  1 tablet Oral QHS   oxyCODONE   10 mg Oral Q12H   pantoprazole   40 mg Oral BID AC   revefenacin   175 mcg Nebulization Daily   rosuvastatin   40 mg Oral Daily   Continuous Infusions:   Diet: Diet Order             Diet regular Room service appropriate? Yes; Fluid consistency: Thin  Diet effective now

## 2023-10-21 NOTE — ED Notes (Signed)
 Trauma Response Nurse Documentation  Kerry Powell is a 78 y.o. female arriving to [facility] ED via EMS  On clopidogrel  75 mg daily. Trauma was activated as a Level 2 based on the following trauma criteria Elderly patients > 65 with head trauma on anti-coagulation (excluding ASA).  Patient cleared for CT by Dr. Rogelia. Pt transported to CT with trauma response nurse present to monitor. RN remained with the patient throughout their absence from the department for clinical observation.   GCS 15.  History   Past Medical History:  Diagnosis Date   Allergy    Anemia    Anxiety    Arthritis    BACK PAIN, CHRONIC 04/04/2007   Qualifier: Diagnosis of  By: [name], Kerry Powell Powell    BIPOLAR AFFECTIVE DISORDER 04/04/2007   Qualifier: History of  By: [name], Kerry Powell E    CAD (coronary artery disease), native coronary artery    mild CAD with no obstructive disease by coronary CTA 06/2019   Cataract    removed both eyes   Chronic neck pain 04/30/2015   Cervical spondylosis [place] [facility] Intermittent numbness and tingling in occipital region and headaches S/p 2 occipital nerve blocks - only temp relief no relief with gabapentin, ultram , brace MRI neck - facet arthrosis    CKD (chronic kidney disease) 04/26/2015   COPD (chronic obstructive pulmonary disease) (HCC)    pt denies - told this while was smoking -   Cough 04/26/2015   Depression 07/01/2017   Diabetes (HCC) 04/27/2015   Diagnosed 03/2015    Dizziness 06/10/2017   Dysphagia 04/26/2015   DYSPNEA 12/24/2007   Qualifier: Diagnosis of  By: Kerry Powell     Elevated TSH 08/09/2015   GASTRIC ULCER, ACUTE 04/07/2007   Annotation: EGD 04/07/07 Qualifier: Diagnosis of  By: [name], Kerry Powell E    GASTRITIS 04/07/2007   Annotation: EGD 04/07/07 Qualifier: Diagnosis of  By: [name], Kerry Powell E    GERD (gastroesophageal reflux disease)    H/O renal cell carcinoma 05/23/2017   Dx 2010, s/p cryoablation Follows with urology - Dr  [name]   HIATAL HERNIA 04/07/2007   Annotation: EGD 04/07/07 Qualifier: Diagnosis of  By: [name], Kerry Powell E    History of CVA (cerebrovascular accident) 04/26/2015   History of gout 04/26/2015   Hyperlipidemia    Hypertension    Hypoxia 03/06/2023   Irritable bowel syndrome 04/04/2007   Qualifier: Diagnosis of  By: [name], Kerry Powell E    Kidney tumor (benign)    Nausea and vomiting 09/09/2017   Occipital neuralgia 05/09/2016   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 Dr Kerry Powell in [place]   OSTEOARTHRITIS 04/04/2007   Qualifier: Diagnosis of  By: [name], Kerry Powell Powell    Personal history of colonic adenoma 06/07/2012   POSTNASAL DRIP SYNDROME 12/29/2007   Qualifier: Diagnosis of  By: [name], Kerry Powell     RESTLESS LEG SYNDROME 04/04/2007   Qualifier: Diagnosis of  By: [name], Kerry Powell Powell    RHINOSINUSITIS, 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   Qualifier: Diagnosis of  By: [name], Kerry Powell Powell    Sleep difficulties 09/09/2017   Snoring 04/26/2015   Stroke (HCC)    x2 2011   Thoracic aortic aneurysm (HCC) 02/06/2018   Seen on MRI 01/25/18 of thoracic spine ordered by [facility] -- referred to CTS to monitor  -- 3.5 cm   TIA (transient ischemic attack)    TOBACCO ABUSE  12/17/2007   Qualifier: Diagnosis of  By: [name], Kerry Powell     Trigeminal neuralgia of right side of face 02/08/2016   URI (upper respiratory infection) 02/08/2016   Vitamin D  deficiency 03/12/2018     Past Surgical History:  Procedure Laterality Date   ABDOMINAL HYSTERECTOMY     APPENDECTOMY     APPLICATION OF WOUND VAC Left 10/09/2023   Procedure: APPLICATION, WOUND VAC;  Surgeon: [name], MD;  Location: [facility] OR;  Service: Orthopedics;  Laterality: Left;   CATARACT EXTRACTION     COLONOSCOPY     CYSTOCELE REPAIR     ESOPHAGOGASTRODUODENOSCOPY     FRACTURE SURGERY  2007   INCISION AND DRAINAGE OF DEEP ABSCESS, CALF Left 10/09/2023   Procedure: INCISION AND DRAINAGE OF DEEP ABSCESS, CALF;  Surgeon: [name], MD;  Location: [facility] OR;  Service: Orthopedics;  Laterality: Left;   KIDNEY SURGERY     benign tumor removal   ORTHOPEDIC SURGERY     Wrist & elbow   POLYPECTOMY     RECTOCELE REPAIR     TONSILECTOMY, ADENOIDECTOMY, BILATERAL MYRINGOTOMY AND TUBES Bilateral 1968   UPPER GASTROINTESTINAL ENDOSCOPY       Initial Focused Assessment (If applicable, or please see trauma documentation): Patient A&Ox4, GCS 15, PERR 4 Airway intact, bilateral breath sounds Pulses 2+  CT's Completed:   CT Head, CT C-Spine, CT Chest w/ contrast, and CT abdomen/pelvis w/ contrast   Interventions:  IV, labs CXR/PXR/L hip XR CT Head/Cspine/C/A/P Pain medication - Fent x4 for pain control 500cc LR bolus  Plan for disposition:  Admission to floor   Consults completed:  Orthopaedic Surgeon at 1106 arrival 1147.  Event Summary: Patient to ED after a mechanical fall at home. Recent hospitalization for L leg wound, washout, sepsis treated by Dr Harden. Patient takes plavix , hematoma to head. Imaging was ordered and revealed a L hip fx, no other injuries. Orthopedics was consulted and plan for surgical fixation tomorrow 8/26. Plan for admission, patient and daughter both informed and questions answered.   Bedside handoff with ED RN Ozell.    Alan CROME Phat [place]  Trauma Response RN  Please call TRN at [phone] for further assistance.

## 2023-10-21 NOTE — ED Provider Notes (Addendum)
 [place] EMERGENCY DEPARTMENT AT [facility] Provider Note   CSN: [id number] Arrival date & time: 10/21/23  1048     Patient presents with: Kerry Powell is a 78 y.o. female.  Patient with history  DM-2, CKD stage IIIb, chronic HFpEF, COPD presents status post ground-level fall.  Per patient, daughter, EMS, patient was ambulating after hearing her children arguing, and had a trip and fall and hit her head on her dresser.  Landed on her left hip.  Denies loss of consciousness, complains of severe left hip pain and limited range of motion.  Denies chest pain, back pain, abdominal pain, endorses left-sided headache where she hit her head.  EMS reports that patient had lowest blood pressure of SBP 88 en route after administration of fentanyl , however they note that patient was recently in the hospital and upon discharge had a systolic blood pressure of 104.  Patient received total of 200 mcg of fentanyl  en route as well as 4 mg Zofran .  Daughter at bedside reports that patient was recently hospitalized for a fall and thereafter developed sepsis prolonging her hospitalization.  The history is provided by the patient, a relative and the EMS personnel.       Prior to Admission medications   Medication Sig Start Date End Date Taking? Authorizing Provider  albuterol  (PROVENTIL ) (2.5 MG/3ML) 0.083% nebulizer solution USE 1 VIAL IN NEBULIZER EVERY 6 HOURS AND AS NEEDED FOR SHORT BREATH/WHEEZE.  Generic:  Ventolin  09/30/23   Meade Verdon RAMAN, MD  allopurinol  (ZYLOPRIM ) 300 MG tablet Take 1 tablet by mouth once daily 09/02/23   Geofm Glade PARAS, MD  amLODipine  (NORVASC ) 5 MG tablet Take 1 tablet (5 mg total) by mouth daily. 10/16/23   Geofm Glade PARAS, MD  budesonide -formoterol  (SYMBICORT ) 160-4.5 MCG/ACT inhaler Inhale 2 puffs into the lungs 2 (two) times daily. 10/13/23   Ghimire, Donalda HERO, MD  cetirizine  (ZYRTEC  ALLERGY) 10 MG tablet Take 10 mg by mouth daily as needed for allergies or  rhinitis. 05/06/19   [provider]  cholecalciferol (VITAMIN D3) 25 MCG (1000 UNIT) tablet Take 2,000 Units by mouth daily.    [provider]  clopidogrel  (PLAVIX ) 75 MG tablet Take 1 tablet by mouth once daily 09/02/23   Geofm Glade PARAS, MD  cyclobenzaprine  (FLEXERIL ) 5 MG tablet Take 1 tablet (5 mg total) by mouth 3 (three) times daily as needed. for muscle spams 03/31/23   Geofm Glade PARAS, MD  doxycycline  (VIBRA -TABS) 100 MG tablet Take 1 tablet (100 mg total) by mouth 2 (two) times daily for 16 days. 10/13/23 10/29/23  Ghimire, Donalda HERO, MD  DULoxetine  (CYMBALTA ) 30 MG capsule Take 2 capsules (60 mg total) by mouth daily. 10/16/23   Geofm Glade PARAS, MD  FARXIGA  10 MG TABS tablet Take 1 tablet (10 mg total) by mouth daily. Take 10 mg by mouth daily. 03/21/23   Geofm Glade PARAS, MD  ferrous sulfate  325 (65 FE) MG tablet Take 325 mg by mouth every other day.    [provider]  fluticasone  (FLONASE ) 50 MCG/ACT nasal spray Place 2 sprays into both nostrils daily. Patient taking differently: Place 2 sprays into both nostrils daily as needed for allergies. 10/03/23   Soldatova, Liuba, MD  furosemide  (LASIX ) 20 MG tablet Take 1 tablet (20 mg total) by mouth daily as needed for fluid or edema. 03/06/23   Geofm Glade PARAS, MD  metoprolol  tartrate (LOPRESSOR ) 25 MG tablet Take 1 tablet (25 mg total)  by mouth 2 (two) times daily. May take extra 25 mg tablet for as needed palpitations 02/12/23   Odell Celinda Balo, MD  montelukast  (SINGULAIR ) 10 MG tablet TAKE 1 TABLET BY MOUTH ONCE DAILY AT BEDTIME 07/25/23   Burns, Glade PARAS, MD  MOUNJARO  5 MG/0.5ML Pen INJECT 1/2 (ONE-HALF) ML SUBCUTANEOUSLY  ONCE A WEEK 09/23/23   Burns, Glade PARAS, MD  oxyCODONE  (OXY IR/ROXICODONE ) 5 MG immediate release tablet Take 1 tablet (5 mg total) by mouth every 6 (six) hours as needed for moderate pain (pain score 4-6). 10/13/23   Ghimire, Donalda HERO, MD  pantoprazole  (PROTONIX ) 40 MG tablet Take 1 tablet (40 mg total) by  mouth 2 (two) times daily before a meal. 02/01/23   Burns, Glade PARAS, MD  rosuvastatin  (CRESTOR ) 40 MG tablet Take 1 tablet by mouth once daily 09/02/23   Turner, Wilbert SAUNDERS, MD  sodium chloride  (OCEAN) 0.65 % SOLN nasal spray Place 1 spray into both nostrils as needed for congestion. 07/29/23   Meade Verdon RAMAN, MD  telmisartan  (MICARDIS ) 40 MG tablet Take 1 tablet (40 mg total) by mouth daily. 08/26/23   Geofm Glade PARAS, MD  tiotropium (SPIRIVA  HANDIHALER) 18 MCG inhalation capsule Place 1 capsule (18 mcg total) into inhaler and inhale daily. 10/13/23 10/12/24  Ghimire, Donalda HERO, MD  traMADol  (ULTRAM ) 50 MG tablet Take 50 mg by mouth 3 (three) times daily as needed. for pain 09/18/23   [provider]    Allergies: Gabapentin, Clindamycin , and Sulfa antibiotics    Review of Systems as per HPI  Updated Vital Signs BP 110/74 (BP Location: Right Arm)   Pulse 82   Temp 97.9 F (36.6 C) (Oral)   Resp 18   Ht 5' 5 (1.651 m)   Wt 88.5 kg   SpO2 97%   BMI 32.45 kg/m   Physical Exam Vitals and nursing note reviewed.  Constitutional:      General: She is not in acute distress.    Appearance: She is well-developed.  HENT:     Head: Normocephalic.     Comments: Ecchymosis, tenderness to palpation, palpable hematoma on the left temporoparietal scalp Eyes:     Conjunctiva/sclera: Conjunctivae normal.  Cardiovascular:     Rate and Rhythm: Normal rate and regular rhythm.     Heart sounds: No murmur heard. Pulmonary:     Effort: Pulmonary effort is normal. No respiratory distress.     Breath sounds: Normal breath sounds.  Abdominal:     Palpations: Abdomen is soft.     Tenderness: There is no abdominal tenderness.  Musculoskeletal:        General: Tenderness and deformity present.     Comments: Chest wall stable to AP and lateral compression, pelvis stable to AP and lateral compression, obvious deformity of left hip with limb shortening and external rotation.  Palpable distal pulses.  Right  upper and right lower extremity without deformity or traumatic findings.  Left upper extremity with skin tear overlying left elbow, no gross deformities   Skin:    General: Skin is warm and dry.     Capillary Refill: Capillary refill takes less than 2 seconds.  Neurological:     Mental Status: She is alert.  Psychiatric:        Mood and Affect: Mood normal.     (all labs ordered are listed, but only abnormal results are displayed) Labs Reviewed  COMPREHENSIVE METABOLIC PANEL WITH GFR - Abnormal; Notable for the following components:  Result Value   CO2 20 (*)    Glucose, Bld 147 (*)    Creatinine, Ser 1.97 (*)    Albumin  3.1 (*)    GFR, Estimated 26 (*)    All other components within normal limits  CBC - Abnormal; Notable for the following components:   WBC 15.1 (*)    All other components within normal limits  I-STAT CHEM 8, ED - Abnormal; Notable for the following components:   BUN 25 (*)    Creatinine, Ser 2.00 (*)    Glucose, Bld 146 (*)    Calcium , Ion 1.09 (*)    Hemoglobin 16.0 (*)    HCT 47.0 (*)    All other components within normal limits  ETHANOL  PROTIME-INR  URINALYSIS, ROUTINE W REFLEX MICROSCOPIC  I-STAT CG4 LACTIC ACID, ED  SAMPLE TO BLOOD BANK    EKG: None  Radiology: [facility] Chest Port 1 View Result Date: 10/21/2023 CLINICAL DATA:  Trauma, fall EXAM: PORTABLE CHEST 1 VIEW COMPARISON:  10/06/2023 FINDINGS: Underpenetrated AP portable chest radiograph. Mild cardiomegaly. No acute abnormality of the lungs. No acute osseous findings. IMPRESSION: Underpenetrated AP portable chest radiograph. Mild cardiomegaly. No acute abnormality of the lungs. Electronically Signed   By: Marolyn JONETTA Jaksch M.D.   On: 10/21/2023 12:36   DG Pelvis Portable Result Date: 10/21/2023 CLINICAL DATA:  Trauma, fall EXAM: PORTABLE PELVIS 1-2 VIEWS COMPARISON:  None Available. FINDINGS: Impacted, angulated intratrochanteric fractures of the left femur. No displaced fracture of the pelvis  or proximal right femur in single frontal view. IMPRESSION: 1. Impacted, angulated intratrochanteric fractures of the left femur. 2. No displaced fracture of the pelvis or proximal right femur in single frontal view. Electronically Signed   By: Marolyn JONETTA Jaksch M.D.   On: 10/21/2023 12:33   DG Elbow Complete Left Result Date: 10/21/2023 CLINICAL DATA:  Fall, pain EXAM: LEFT ELBOW - COMPLETE 3+ VIEW COMPARISON:  None Available. FINDINGS: No displaced fracture or dislocation of the left elbow. Obliquity of lateral view provided for review limits assessment for effusion. No obvious effusion. Soft tissues unremarkable. IMPRESSION: No displaced fracture or dislocation of the left elbow. Obliquity of lateral view provided for review limits assessment for effusion. No obvious effusion. Electronically Signed   By: Marolyn JONETTA Jaksch M.D.   On: 10/21/2023 12:28   CT CHEST ABDOMEN PELVIS W CONTRAST Result Date: 10/21/2023 CLINICAL DATA:  Trauma, fall EXAM: CT CHEST, ABDOMEN, AND PELVIS WITH CONTRAST TECHNIQUE: Multidetector CT imaging of the chest, abdomen and pelvis was performed following the standard protocol during bolus administration of intravenous contrast. RADIATION DOSE REDUCTION: This exam was performed according to the departmental dose-optimization program which includes automated exposure control, adjustment of the mA and/or kV according to patient size and/or use of iterative reconstruction technique. CONTRAST:  60mL OMNIPAQUE  IOHEXOL  350 MG/ML SOLN COMPARISON:  None Available. FINDINGS: CT CHEST FINDINGS Cardiovascular: Severe aortic atherosclerosis with a large burden of irregular mural thrombus particularly throughout the descending vessel. Normal heart size. Three-vessel coronary artery calcifications. No pericardial effusion. Mediastinum/Nodes: No enlarged mediastinal, hilar, or axillary lymph nodes. Thyroid  gland, trachea, and esophagus demonstrate no significant findings. Lungs/Pleura: Moderate  centrilobular emphysema. No pleural effusion or pneumothorax. Musculoskeletal: No chest wall abnormality. No acute osseous findings. Multiple subacute, callused fractures of the anterolateral left ribs (series 3, image 43). CT ABDOMEN PELVIS FINDINGS Hepatobiliary: No solid liver abnormality is seen. No gallstones, gallbladder wall thickening, or biliary dilatation. Pancreas: Unremarkable. No pancreatic ductal dilatation or surrounding inflammatory changes. Spleen: Normal in  size without significant abnormality. Adrenals/Urinary Tract: Adrenal glands are unremarkable. Severely atrophic left kidney. Scarring and atrophy of the posterior cortex of the right kidney. No calculi or hydronephrosis. Bladder is unremarkable. Stomach/Bowel: Stomach is within normal limits. Appendix not clearly visualized. No evidence of bowel wall thickening, distention, or inflammatory changes. Vascular/Lymphatic: Severe aortic atherosclerosis. No enlarged abdominal or pelvic lymph nodes. Reproductive: Hysterectomy. Other: No abdominal wall hernia or abnormality. No ascites. Musculoskeletal: Impacted, angulated intratrochanteric fractures of the left femur (series 3, image 120, series 6, image 55) IMPRESSION: 1. Impacted, angulated intratrochanteric fractures of the left femur. 2. Multiple subacute, callused fractures of the anterolateral left ribs. 3. No other CT evidence of acute traumatic injury to the chest, abdomen, or pelvis. 4. Severe aortic atherosclerosis with a large burden of irregular mural thrombus particularly throughout the descending thoracic aorta. 5. Coronary artery disease. Aortic Atherosclerosis (ICD10-I70.0) and Emphysema (ICD10-J43.9). Electronically Signed   By: Marolyn JONETTA Jaksch M.D.   On: 10/21/2023 12:27   CT HEAD WO CONTRAST Result Date: 10/21/2023 CLINICAL DATA:  Provided history: Head trauma, moderate/severe. Head CT Polytrauma, blunt. EXAM: CT HEAD WITHOUT CONTRAST CT CERVICAL SPINE WITHOUT CONTRAST TECHNIQUE:  Multidetector CT imaging of the head and cervical spine was performed following the standard protocol without intravenous contrast. Multiplanar CT image reconstructions of the cervical spine were also generated. RADIATION DOSE REDUCTION: This exam was performed according to the departmental dose-optimization program which includes automated exposure control, adjustment of the mA and/or kV according to patient size and/or use of iterative reconstruction technique. COMPARISON:  Head CT 01/21/2022.  Cervical spine CT 01/21/2022. FINDINGS: CT HEAD FINDINGS Brain: No age-advanced or lobar predominant cerebral atrophy. Chronic lacunar infarcts again demonstrated within the right caudate nucleus and right thalamus. Prominent perivascular space versus chronic lacunar infarct within the left lentiform nucleus, unchanged. Background mild cerebral white matter chronic small vessel ischemic disease, better appreciated on the prior brain MRI 06/09/2017. There is no acute intracranial hemorrhage. No demarcated cortical infarct. No extra-axial fluid collection. No evidence of an intracranial mass. No midline shift. Vascular: No hyperdense vessel.  Atherosclerotic calcifications. Skull: No calvarial fracture or aggressive osseous lesion. Sinuses/Orbits: No mass or acute finding within the imaged orbits. Minimal mucosal thickening within the left frontal sinus and within bilateral ethmoid air cells. Other: Large left-sided scalp hematoma. CT CERVICAL SPINE FINDINGS Alignment: Mild levocurvature of the cervical spine. Nonspecific reversal of the expected cervical doses. 2 mm C2-C3 grade 1 anterolisthesis. 4 mm C4-C5 grade 1 anterolisthesis. Slight grade 1 anterolisthesis at C5-C6, C6-C7, C7-T1, T1-T2 and T3-T4. Skull base and vertebrae: The basion-dental and atlanto-dental intervals are maintained.No evidence of acute fracture to the cervical spine. Developmental incomplete segmentation of the T2-T3 vertebrae. Subcentimeter sclerotic  focus within the left aspect of the T3 vertebral body, nonspecific but likely reflecting a bone island. Soft tissues and spinal canal: No prevertebral fluid or swelling. No visible canal hematoma. Disc levels: Cervical spondylosis with multilevel disc space narrowing, disc bulges/central disc protrusions and overall advanced facet arthropathy. No appreciable high-grade spinal canal stenosis. Multilevel bony neural foraminal narrowing. Degenerative changes also present at the atlantooccipital articulations (right greater than left) and at the C1-C2 articulation. Upper chest: No consolidation within the imaged lung apices. No visible pneumothorax. Biapical pleuroparenchymal scarring. Emphysema. Aortic atherosclerosis. IMPRESSION: CT head: 1.  No evidence of an acute intracranial abnormality. 2. Large left scalp hematoma. 3. 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 CT cervical spine: 1. No evidence of an  acute cervical spine fracture. 2. Nonspecific reversal of the expected cervical lordosis. 3. Grade 1 anterolisthesis at C2-C3, C4-C5, C5-C6, C6-C7, C7-T1, T1-T2 and T3-T4. 4. Cervical spondylosis as described. 5. Aortic Atherosclerosis (ICD10-I70.0) and Emphysema (ICD10-J43.9). Electronically Signed   By: Rockey Childs D.O.   On: 10/21/2023 12:06   CT CERVICAL SPINE WO CONTRAST Result Date: 10/21/2023 CLINICAL DATA:  Provided history: Head trauma, moderate/severe. Head CT Polytrauma, blunt. EXAM: CT HEAD WITHOUT CONTRAST CT CERVICAL SPINE WITHOUT CONTRAST TECHNIQUE: Multidetector CT imaging of the head and cervical spine was performed following the standard protocol without intravenous contrast. Multiplanar CT image reconstructions of the cervical spine were also generated. RADIATION DOSE REDUCTION: This exam was performed according to the departmental dose-optimization program which includes automated exposure control, adjustment of the mA and/or kV according to  patient size and/or use of iterative reconstruction technique. COMPARISON:  Head CT 01/21/2022.  Cervical spine CT 01/21/2022. FINDINGS: CT HEAD FINDINGS Brain: No age-advanced or lobar predominant cerebral atrophy. Chronic lacunar infarcts again demonstrated within the right caudate nucleus and right thalamus. Prominent perivascular space versus chronic lacunar infarct within the left lentiform nucleus, unchanged. Background mild cerebral white matter chronic small vessel ischemic disease, better appreciated on the prior brain MRI 06/09/2017. There is no acute intracranial hemorrhage. No demarcated cortical infarct. No extra-axial fluid collection. No evidence of an intracranial mass. No midline shift. Vascular: No hyperdense vessel.  Atherosclerotic calcifications. Skull: No calvarial fracture or aggressive osseous lesion. Sinuses/Orbits: No mass or acute finding within the imaged orbits. Minimal mucosal thickening within the left frontal sinus and within bilateral ethmoid air cells. Other: Large left-sided scalp hematoma. CT CERVICAL SPINE FINDINGS Alignment: Mild levocurvature of the cervical spine. Nonspecific reversal of the expected cervical doses. 2 mm C2-C3 grade 1 anterolisthesis. 4 mm C4-C5 grade 1 anterolisthesis. Slight grade 1 anterolisthesis at C5-C6, C6-C7, C7-T1, T1-T2 and T3-T4. Skull base and vertebrae: The basion-dental and atlanto-dental intervals are maintained.No evidence of acute fracture to the cervical spine. Developmental incomplete segmentation of the T2-T3 vertebrae. Subcentimeter sclerotic focus within the left aspect of the T3 vertebral body, nonspecific but likely reflecting a bone island. Soft tissues and spinal canal: No prevertebral fluid or swelling. No visible canal hematoma. Disc levels: Cervical spondylosis with multilevel disc space narrowing, disc bulges/central disc protrusions and overall advanced facet arthropathy. No appreciable high-grade spinal canal stenosis.  Multilevel bony neural foraminal narrowing. Degenerative changes also present at the atlantooccipital articulations (right greater than left) and at the C1-C2 articulation. Upper chest: No consolidation within the imaged lung apices. No visible pneumothorax. Biapical pleuroparenchymal scarring. Emphysema. Aortic atherosclerosis. IMPRESSION: CT head: 1.  No evidence of an acute intracranial abnormality. 2. Large left scalp hematoma. 3. 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 CT cervical spine: 1. No evidence of an acute cervical spine fracture. 2. Nonspecific reversal of the expected cervical lordosis. 3. Grade 1 anterolisthesis at C2-C3, C4-C5, C5-C6, C6-C7, C7-T1, T1-T2 and T3-T4. 4. Cervical spondylosis as described. 5. Aortic Atherosclerosis (ICD10-I70.0) and Emphysema (ICD10-J43.9). Electronically Signed   By: Rockey Childs D.O.   On: 10/21/2023 12:06    .Critical Care  Performed by: Rogelia Jerilynn RAMAN, MD Authorized by: Rogelia Jerilynn RAMAN, MD   Critical care provider statement:    Critical care time (minutes):  32   Critical care time was exclusive of:  Separately billable procedures and treating other patients   Critical care was necessary to treat or prevent imminent or life-threatening deterioration of the  following conditions:  Trauma   Critical care was time spent personally by me on the following activities:  Development of treatment plan with patient or surrogate, discussions with consultants, evaluation of patient's response to treatment, examination of patient, ordering and review of laboratory studies, ordering and review of radiographic studies, ordering and performing treatments and interventions, pulse oximetry, re-evaluation of patient's condition, review of old charts and obtaining history from patient or surrogate   I assumed direction of critical care for this patient from another provider in my specialty: yes     Care discussed  with: admitting provider      Medications Ordered in the ED  chlorhexidine  (HIBICLENS ) 4 % liquid 4 Application (has no administration in time range)  povidone-iodine  10 % swab 2 Application (has no administration in time range)  ceFAZolin  (ANCEF ) IVPB 2g/100 mL premix (has no administration in time range)  methocarbamol  (ROBAXIN ) tablet 500 mg (has no administration in time range)    Or  methocarbamol  (ROBAXIN ) injection 500 mg (has no administration in time range)  docusate sodium  (COLACE) capsule 100 mg (has no administration in time range)  polyethylene glycol (MIRALAX  / GLYCOLAX ) packet 17 g (has no administration in time range)  bisacodyl  (DULCOLAX) EC tablet 5 mg (has no administration in time range)  fentaNYL  (SUBLIMAZE ) injection 25 mcg (25 mcg Intravenous Given 10/21/23 1450)  lactated ringers  bolus 500 mL (0 mLs Intravenous Stopped 10/21/23 1434)  iohexol  (OMNIPAQUE ) 350 MG/ML injection 60 mL (60 mLs Intravenous Contrast Given 10/21/23 1150)  fentaNYL  (SUBLIMAZE ) injection 50 mcg (50 mcg Intravenous Given 10/21/23 1155)                                  Medical Decision Making Please see MAR for complete past medical history, surgical history, and social history.   Physical exam is pertinent for ecchymosis and palpable hematoma to left parietal occipital scalp, deformity of left hip with shortened and externally rotated limb.   The differential includes but is not limited to ICH, TBI, skull fracture, spinal fracture/dislocation, blunt thoracic trauma, hemothorax, pneumothorax, rib fractures, blunt abdominal trauma, hemorrhage, extremity fracture, dislocation.  Additional history obtained from: Daughter, EMS External records from outside source obtained and reviewed including: Reviewed prior discharge summary from 8/17  Interventions: Fentanyl   See the EMR for full details regarding lab and imaging results.  Prior to arrival of the patient, the room was prepared, trauma  nursing was present prior to arrival of the patient.    Upon arrival, the patient was transferred over to the trauma bed. ABCs intact as exam above. The secondary exam was performed, and findings are noted above. Pertinent physical exam findings include left scalp hematoma and contusion, skin tear of left elbow, and left hip deformity. Portable XRs performed at the bedside.  The patient was then prepared and sent to the CT for full trauma scans.   Currently, patient is awake, alert, and protecting own airway and is hemodynamically stable.  Consults: Orthopedic surgery  Given polytrauma, Plavix  usage, patient did undergo trauma pan scans which ultimately revealed only left hip fracture.  Patient had fluctuating oxygen requirement while in the ED, ultimately required 3 L up from baseline of 2 L nasal cannula.  Orthopedic surgery was consulted, and feel that patient requires admission for operative intervention, request medicine admission given medical complexity of patient.  Hospitalists consulted and patient was admitted.  No additional acute events while in  my care.   Amount and/or Complexity of Data Reviewed Independent Historian: caregiver and EMS    Details: daughter Labs: ordered.    Details: CBC: Slight leukocytosis to 15.1, likely stress margination in the setting of trauma.  No anemia or thrombocytopenia. CMP: No emergent electrolyte derangement, elevation of creatinine, overall similar in comparison to prior Coags: WNL LA: WNL Ethanol: Negative  Radiology: ordered.    Details: CT head: No ICH or displaced skull fracture CT C-spine: No displaced fracture or dislocation CT CAP: No pneumothorax, hemothorax, intra-abdominal free air, significant intra-abdominal free fluid.  Left proximal femur fracture. CXR: No acute cardiac or pulmonary abnormality. No appreciable rib fx. No PTX.  Pelvis XR: Pelvic ring intact. Both hips located.  Left proximal femur fracture Extremity XR's: Left  elbow plain films without evidence of acute fracture or dislocation   Risk Prescription drug management. Parenteral controlled substances. Decision regarding hospitalization.   It is medically necessary for the patient to receive the requested CT scan despite poor kidney function. Benefits outweigh the risks.  Risks and benefits discussed with patient and daughter at bedside, they are amenable to patient receiving contrast, additionally will administer 500 cc LR.  See article below:  Acute Kidney Injury After Computed Tomography: A Meta-analysis Aycock et al.   [email or web address]  Study objective Computed tomography (CT) is an important imaging modality used in the diagnosis of a variety of disorders. Imaging quality may be improved if intravenous contrast is added, but there is a concern for potential renal injury. Our goal is to perform a meta-analysis to compare the risk of acute kidney injury, need for renal replacement, and total mortality after contrast-enhanced CT versus noncontrast CT.  Methods We searched MEDLINE (PubMed), the Cochrane Library, [employer], Hotel manager, Educational psychologist, Music therapist for relevant articles. Included articles specifically compared rates of renal insufficiency, need for renal replacement therapy, or mortality in patients who received intravenous contrast versus those who received no contrast.  Results The database search returned 14,691 articles, inclusive of duplicates. Twenty-six unique articles met our inclusion criteria, with an additional 2 articles found through hand searching. In total, 28 studies involving [id number],335 participants were included in the final analysis, all of which were observational. Meta-analysis demonstrated that, compared with noncontrast CT, contrast-enhanced CT was not significantly associated with either acute kidney injury (odds ratio [OR] 0.94; 95% confidence interval [CI] 0.83 to 1.07),  need for renal replacement therapy (OR 0.83; 95% CI 0.59 to 1.16), or all-cause mortality (OR 1.0; 95% CI 0.73 to 1.36).  Conclusion We found no significant differences in our principal study outcomes between patients receiving contrast-enhanced CT versus those receiving noncontrast CT. Given similar frequencies of acute kidney injury in patients receiving noncontrast CT, other patient- and illness-level factors, rather than the use of contrast material, likely contribute to the development of acute kidney injury.   Final diagnoses:  Fall, initial encounter  Closed fracture of left hip, initial encounter [facility])    ED Discharge Orders     None          Rogelia Jerilynn RAMAN, MD 10/21/23 1548    Rogelia Jerilynn RAMAN, MD 10/21/23 1736

## 2023-10-21 NOTE — TOC CAGE-AID Note (Signed)
 Transition of Care [facility]) - CAGE-AID Screening  Patient Details  Name: Kerry Powell MRN: [id number] Date of Birth: 1945/03/02  Clinical Narrative:  Patient denies any current alcohol  or drug use, substance abuse resources not needed at this time.  CAGE-AID Screening:   Have You Ever Felt You Ought to Cut Down on Your Drinking or Drug Use?: No Have People Annoyed You By Critizing Your Drinking Or Drug Use?: No Have You Felt Bad Or Guilty About Your Drinking Or Drug Use?: No Have You Ever Had a Drink or Used Drugs First Thing In The Morning to Steady Your Nerves or to Get Rid of a Hangover?: No CAGE-AID Score: 0

## 2023-10-22 ENCOUNTER — Inpatient Hospital Stay (HOSPITAL_COMMUNITY)

## 2023-10-22 ENCOUNTER — Encounter (HOSPITAL_COMMUNITY): Payer: Self-pay | Admitting: Anesthesiology

## 2023-10-22 ENCOUNTER — Encounter (HOSPITAL_COMMUNITY)
Admission: EM | Disposition: A | Discharge status: Skilled Nursing Facility | Payer: Self-pay | Source: Home / Self Care | Attending: Internal Medicine

## 2023-10-22 ENCOUNTER — Encounter (HOSPITAL_COMMUNITY): Payer: Self-pay | Admitting: Internal Medicine

## 2023-10-22 ENCOUNTER — Other Ambulatory Visit (HOSPITAL_COMMUNITY)

## 2023-10-22 DIAGNOSIS — S72142A Displaced intertrochanteric fracture of left femur, initial encounter for closed fracture: Secondary | ICD-10-CM | POA: Diagnosis not present

## 2023-10-22 DIAGNOSIS — Z01810 Encounter for preprocedural cardiovascular examination: Secondary | ICD-10-CM

## 2023-10-22 DIAGNOSIS — W19XXXA Unspecified fall, initial encounter: Secondary | ICD-10-CM | POA: Diagnosis not present

## 2023-10-22 DIAGNOSIS — R079 Chest pain, unspecified: Secondary | ICD-10-CM | POA: Diagnosis not present

## 2023-10-22 DIAGNOSIS — Y92009 Unspecified place in unspecified non-institutional (private) residence as the place of occurrence of the external cause: Secondary | ICD-10-CM | POA: Diagnosis not present

## 2023-10-22 LAB — COMPREHENSIVE METABOLIC PANEL WITH GFR
ALT: 14 U/L (ref 0–44)
AST: 18 U/L (ref 15–41)
Albumin: 2.9 g/dL — ABNORMAL LOW (ref 3.5–5.0)
Alkaline Phosphatase: 76 U/L (ref 38–126)
Anion gap: 8 (ref 5–15)
BUN: 18 mg/dL (ref 8–23)
CO2: 23 mmol/L (ref 22–32)
Calcium: 8.8 mg/dL — ABNORMAL LOW (ref 8.9–10.3)
Chloride: 103 mmol/L (ref 98–111)
Creatinine, Ser: 1.85 mg/dL — ABNORMAL HIGH (ref 0.44–1.00)
GFR, Estimated: 28 mL/min — ABNORMAL LOW (ref >60)
Glucose, Bld: 103 mg/dL — ABNORMAL HIGH (ref 70–99)
Potassium: 3.7 mmol/L (ref 3.5–5.1)
Sodium: 134 mmol/L — ABNORMAL LOW (ref 135–145)
Total Bilirubin: 0.9 mg/dL (ref 0.0–1.2)
Total Protein: 6.6 g/dL (ref 6.5–8.1)

## 2023-10-22 LAB — ECHOCARDIOGRAM COMPLETE
AR max vel: 2.82 cm2
AV Peak grad: 5.9 mmHg
Ao pk vel: 1.21 m/s
Area-P 1/2: 4.1 cm2
Height: 65 in
S' Lateral: 2.5 cm
Weight: 3121.71 [oz_av]

## 2023-10-22 LAB — CBC WITH DIFFERENTIAL/PLATELET
Abs Immature Granulocytes: 0.04 K/uL (ref 0.00–0.07)
Basophils Absolute: 0 K/uL (ref 0.0–0.1)
Basophils Relative: 0 %
Eosinophils Absolute: 0.1 K/uL (ref 0.0–0.5)
Eosinophils Relative: 1 %
HCT: 42 % (ref 36.0–46.0)
Hemoglobin: 13.5 g/dL (ref 12.0–15.0)
Immature Granulocytes: 0 %
Lymphocytes Relative: 14 %
Lymphs Abs: 1.7 K/uL (ref 0.7–4.0)
MCH: 30.5 pg (ref 26.0–34.0)
MCHC: 32.1 g/dL (ref 30.0–36.0)
MCV: 94.8 fL (ref 80.0–100.0)
Monocytes Absolute: 1.4 K/uL — ABNORMAL HIGH (ref 0.1–1.0)
Monocytes Relative: 12 %
Neutro Abs: 8.8 K/uL — ABNORMAL HIGH (ref 1.7–7.7)
Neutrophils Relative %: 73 %
Platelets: 277 K/uL (ref 150–400)
RBC: 4.43 MIL/uL (ref 3.87–5.11)
RDW: 14.2 % (ref 11.5–15.5)
WBC: 12.1 K/uL — ABNORMAL HIGH (ref 4.0–10.5)
nRBC: 0 % (ref 0.0–0.2)

## 2023-10-22 LAB — GLUCOSE, CAPILLARY
Glucose-Capillary: 103 mg/dL — ABNORMAL HIGH (ref 70–99)
Glucose-Capillary: 106 mg/dL — ABNORMAL HIGH (ref 70–99)
Glucose-Capillary: 111 mg/dL — ABNORMAL HIGH (ref 70–99)
Glucose-Capillary: 127 mg/dL — ABNORMAL HIGH (ref 70–99)
Glucose-Capillary: 95 mg/dL (ref 70–99)

## 2023-10-22 LAB — PHOSPHORUS: Phosphorus: 3.9 mg/dL (ref 2.5–4.6)

## 2023-10-22 LAB — BLOOD GAS, ARTERIAL
Acid-base deficit: 0.7 mmol/L (ref 0.0–2.0)
Bicarbonate: 24.2 mmol/L (ref 20.0–28.0)
O2 Saturation: 93.4 %
Patient temperature: 37
pCO2 arterial: 40 mmHg (ref 32–48)
pH, Arterial: 7.39 (ref 7.35–7.45)
pO2, Arterial: 61 mmHg — ABNORMAL LOW (ref 83–108)

## 2023-10-22 LAB — MAGNESIUM: Magnesium: 1.7 mg/dL (ref 1.7–2.4)

## 2023-10-22 LAB — BRAIN NATRIURETIC PEPTIDE: B Natriuretic Peptide: 95.2 pg/mL (ref 0.0–100.0)

## 2023-10-22 SURGERY — FIXATION, FRACTURE, INTERTROCHANTERIC, WITH INTRAMEDULLARY ROD
Anesthesia: General | Laterality: Left

## 2023-10-22 MED ORDER — INSULIN ASPART 100 UNIT/ML IJ SOLN: 0.0000 [IU] | INTRAMUSCULAR | Status: DC | PRN

## 2023-10-22 MED ORDER — CHLORHEXIDINE GLUCONATE 0.12 % MT SOLN: 15.0000 mL | Freq: Once | OROMUCOSAL | Status: AC

## 2023-10-22 MED ORDER — CARMEX CLASSIC LIP BALM EX OINT
1.0000 | TOPICAL_OINTMENT | CUTANEOUS | Status: DC | PRN
  Filled 2023-10-22: qty 10

## 2023-10-22 MED ORDER — ARFORMOTEROL TARTRATE 15 MCG/2ML IN NEBU
15.0000 ug | INHALATION_SOLUTION | Freq: Two times a day (BID) | RESPIRATORY_TRACT | Status: DC
  Administered 2023-10-22 – 2023-10-31 (×17): 15 ug via RESPIRATORY_TRACT
  Filled 2023-10-22 (×18): qty 2

## 2023-10-22 MED ORDER — BUDESONIDE 0.25 MG/2ML IN SUSP
0.2500 mg | Freq: Two times a day (BID) | RESPIRATORY_TRACT | Status: DC
  Administered 2023-10-22 – 2023-10-31 (×17): 0.25 mg via RESPIRATORY_TRACT
  Filled 2023-10-22 (×18): qty 2

## 2023-10-22 MED ORDER — 0.9 % SODIUM CHLORIDE (POUR BTL) OPTIME: TOPICAL | Status: DC | PRN

## 2023-10-22 MED ORDER — RENA-VITE PO TABS
1.0000 | ORAL_TABLET | Freq: Every day | ORAL | Status: DC
  Administered 2023-10-22 – 2023-10-30 (×9): 1 via ORAL
  Filled 2023-10-22 (×9): qty 1

## 2023-10-22 MED ORDER — CHLORHEXIDINE GLUCONATE CLOTH 2 % EX PADS
6.0000 | MEDICATED_PAD | Freq: Every day | CUTANEOUS | Status: DC
  Administered 2023-10-22 – 2023-10-24 (×3): 6 via TOPICAL

## 2023-10-22 MED ORDER — SODIUM CHLORIDE 0.9 % IV SOLN: INTRAVENOUS | Status: AC

## 2023-10-22 MED ORDER — REVEFENACIN 175 MCG/3ML IN SOLN
175.0000 ug | Freq: Every day | RESPIRATORY_TRACT | Status: DC
  Administered 2023-10-22 – 2023-10-31 (×8): 175 ug via RESPIRATORY_TRACT
  Filled 2023-10-22 (×10): qty 3

## 2023-10-22 MED ORDER — FENTANYL CITRATE (PF) 250 MCG/5ML IJ SOLN
INTRAMUSCULAR | Status: AC
  Filled 2023-10-22: qty 5

## 2023-10-22 MED ORDER — LACTATED RINGERS IV SOLN: INTRAVENOUS | Status: DC

## 2023-10-22 MED ORDER — CHLORHEXIDINE GLUCONATE 0.12 % MT SOLN
OROMUCOSAL | Status: AC
Start: 2023-10-22 — End: 2023-10-22
  Administered 2023-10-22: 15 mL via OROMUCOSAL
  Filled 2023-10-22: qty 15

## 2023-10-22 MED ORDER — ORAL CARE MOUTH RINSE: 15.0000 mL | Freq: Once | OROMUCOSAL | Status: AC

## 2023-10-22 NOTE — Telephone Encounter (Signed)
 Copied from CRM [id number]. Topic: General - Other >> Oct 22, 2023 12:11 PM Pinkey ORN wrote: Reason for CRM: [facility] >> Oct 22, 2023 12:13 PM Pinkey ORN wrote: Amy - Physical Therapist [phone] [facility]  Called on behalf of patient, states she experienced a fall and has fractured her hip. Amy is also returning a missed call.

## 2023-10-22 NOTE — Progress Notes (Signed)
 Askia, RN from [id number] made aware that the pt will be returning to the uint w/o having surgery today due to her needing further cardiac work-up and clearance prior to her procedure. Pt in no apparent distress. Transporter will transport her back to the floor.

## 2023-10-22 NOTE — Progress Notes (Signed)
   10/22/23 1952  Vitals  Temp 97.9 F (36.6 C)  Temp Source Oral  BP (!) 83/52  MAP (mmHg) (!) 61  BP Location Left Arm  BP Method Automatic  Patient Position (if appropriate) Lying  Pulse Rate 93  Pulse Rate Source Monitor  Resp 20  MEWS COLOR  MEWS Score Color Green  Oxygen Therapy  SpO2 95 %  O2 Device Nasal Cannula  O2 Flow Rate (L/min) 3 L/min  MEWS Score  MEWS Temp 0  MEWS Systolic 1  MEWS Pulse 0  MEWS RR 0  MEWS LOC 0  MEWS Score 1     10/22/23 1952  Vitals  Temp 97.9 F (36.6 C)  Temp Source Oral  BP (!) 83/52  MAP (mmHg) (!) 61  BP Location Left Arm  BP Method Automatic  Patient Position (if appropriate) Lying  Pulse Rate 93  Pulse Rate Source Monitor  Resp 20  MEWS COLOR  MEWS Score Color Green  Oxygen Therapy  SpO2 95 %  O2 Device Nasal Cannula  O2 Flow Rate (L/min) 3 L/min  MEWS Score  MEWS Temp 0  MEWS Systolic 1  MEWS Pulse 0  MEWS RR 0  MEWS LOC 0  MEWS Score 1   MD on call notified.

## 2023-10-22 NOTE — Progress Notes (Signed)
 PROGRESS NOTE Kerry Powell  FMW:[id number] DOB: Jan 24, 1946 DOA: 10/21/2023 PCP: Geofm Glade PARAS, MD  Brief Narrative/Hospital Course: 78 year old female with history of T2DM CKD 3B chronic HFpEF, COPD-on home oxygen 2 L since last discharge admitted with ground-level fall.  Recently admitted and discharged on August 17 after sepsis and right lower extremity cellulitis abscess treatment and was debrided by Dr. Harden. In the ED vitals BP soft afebrile on 3 L nasal cannula.  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  Lung showed moderate centrilobular emphysema no pleural effusion or pneumothorax. She had oxygen requirement while in the ED up to 3 L from baseline 2 L Orthopedic surgery consulted and admitted to medicine overnight  Subjective: Seen and examined today Overnight afebrile BP stable , patient needing supplemental oxygen on venti mask Labs reviewed creatinine slightly better 1.8 WBC downtrending hemoglobin at 13.5 Patient had some shortness of breath on Ventimask being weaned down to nasal cannula She was hypoxic in the high 88s per RN after dilaudid  last night  and placed on venti mask and weaning to [place] now  Assessment and plan:  Impacted angulated intertrochanteric fracture of left femur Fall at home, initial encounter Preop evaluation: Orthopedic has been consulted for operative intervention.  Patient does have history of CVA, CHF, CKD, diabetes and is high risk.  Currently remains in significant oxygen.  CT imaging shows  significant atherosclerotic disease including coronary, will consult cardiology for preop evaluation.she is on Ventimask will wean down oxygen to home setting as tolerated. She had CT coronary scan on 2021 and last echo in 2020-with normal LV function See reports having chest pain last week.  At baseline she gets dyspneic and short of breath with doing simple activity/ chore work at home.  Family understands she needs surgery to maintain her quality of life, and are aware of the risks.  COPD/emphysema Acute on chronic hypoxic resp failure: Recently discharged on supplemental oxygen, currently needing Ventimask.  Will keep her on Pulmicort /Brovana /Yupelri  and bronchodilators as needed, I-S.  Weaned to nasal cannula at 4 L.  ABG obtained no CO2 retention pCO2 61, BNP ordered  Recent admission for LLE purulent cellulitis/abscess/sepsis: Was treated with antibiotics and debridement by Dr. Harden Cont wound care  CAD HLD PAD on Plavix  and Crestor  CT scan showing large burden of irregular mural thrombus particularly descending thoracic aorta/coronary atherosclerosis: Plavix  on hold.  Dr. Silver from [facility] was curbsided who advised outpatient follow-up and start [facility] once able  Hypertension: BP stable, PTA on metoprolol , telmisartan .  BP soft holding meds  Chronic HFpEF: Check BNP.  Imaging shows emphysema no pleural effusion or edema.  Resume home Lasix  as able  Anemia of chronic disease: Monitor hemoglobin resume home iron supplement  GERD: Continue PPI  Diabetes mellitus with hyperglycemia: Holding OHA, continue SSI  CKD stage IIIb: B/l creatinine around 1.9-2, stable.  Avoid nephrotoxic medication monitor  Class I Obesity w/ Body mass index is 32.45 kg/m.: Will benefit with PCP follow-up, weight loss,healthy lifestyle and outpatient sleep eval if not done.  Mobility: PT Orders:  PT Follow up Rec:  DVT prophylaxis: SCDs Start: 10/21/23 1412 Code Status:   Code Status: Full  Code Family Communication: plan of care discussed with patient/daughter at bedside. Patient status is: Remains hospitalized because of severity of illness Level of care: Telemetry Medical   Dispo: The patient is from: home            Anticipated disposition: TBD Objective: Vitals last 24 hrs: Vitals:   10/21/23 1608 10/21/23 2213 10/22/23 0748 10/22/23 0801  BP: 122/65 115/65 108/70   Pulse: 78 86 86   Resp: 18 19    Temp:  97.9 F (36.6 C) 98.6 F (37 C)   TempSrc: Oral  Oral   SpO2: 96% 94% 95% 96%  Weight:      Height:        Physical Examination: General exam: alert awake, oriented, older than stated age HEENT:Oral mucosa moist, Ear/Nose WNL grossly Respiratory system: Bilaterally diminished BS,no use of accessory muscle Cardiovascular system: S1 & S2 +, No JVD. Gastrointestinal system: Abdomen soft,NT,ND, BS+ Nervous System: Alert, awake, moving all extremities,and following commands. Extremities: LE edema neg, distal extremities warm.  Skin: No rashes,no icterus. MSK: Normal muscle bulk,tone, power, left leg shortened and externally rotated  Medications reviewed:  Scheduled Meds:  chlorhexidine   60 mL Topical Once   feeding supplement  237 mL Oral BID BM   fluticasone  furoate-vilanterol  1 puff Inhalation Daily   insulin  aspart  0-15 Units Subcutaneous TID WC   montelukast   10 mg Oral QHS   pantoprazole   40 mg Oral BID AC   povidone-iodine   2 Application Topical Once   rosuvastatin   40 mg Oral Daily   umeclidinium bromide   1 puff Inhalation Daily   Continuous Infusions:   ceFAZolin  (ANCEF ) IV     doxycycline  (VIBRAMYCIN ) IV 100 mg (10/21/23 2310)   Diet: Diet Order             Diet NPO time specified  Diet effective midnight                     Data Reviewed: I have personally reviewed following labs and imaging studies ( see epic result tab) CBC: Recent Labs  Lab 10/16/23 1530 10/21/23 1110 10/21/23 1121 10/22/23 0503  WBC 9.6 15.1*  --   12.1*  NEUTROABS 6.0  --   --  8.8*  HGB 15.7* 14.6 16.0* 13.5  HCT 48.8* 45.8 47.0* 42.0  MCV 94.3 95.4  --  94.8  PLT 333.0 304  --  277   CMP: Recent Labs  Lab 10/16/23 1530 10/21/23 1110 10/21/23 1121 10/22/23 0503  NA 137 137 139 134*  K 4.3 4.2 4.3 3.7  CL 98 109 107 103  CO2 32 20*  --  23  GLUCOSE 116* 147* 146* 103*  BUN 24* 22 25* 18  CREATININE 1.87* 1.97* 2.00* 1.85*  CALCIUM  9.1 9.1  --  8.8*  MG  --   --   --  1.7  PHOS  --   --   --  3.9   GFR: Estimated Creatinine Clearance: 27.5 mL/min (A) (by C-G formula based on SCr of 1.85 mg/dL (H)). Recent Labs  Lab 10/16/23 1530 10/21/23 1110 10/22/23 0503  AST 17 20 18   ALT 17 16 14   ALKPHOS 96 79 76  BILITOT 0.7 0.8 0.9  PROT 7.7 7.1 6.6  ALBUMIN  3.8 3.1* 2.9*   No results for input(s): LIPASE, AMYLASE in the last 168 hours. No results for input(s): AMMONIA in  the last 168 hours. Coagulation Profile:  Recent Labs  Lab 10/21/23 1110  INR 1.0   Unresulted Labs (From admission, onward)     Start     Ordered   10/23/23 0500  Basic metabolic panel with GFR  Daily,   R      10/22/23 0824   10/23/23 0500  CBC  Daily,   R      10/22/23 0824   10/21/23 1108  Urinalysis, Routine w reflex microscopic -Urine, Clean Catch  [facility] ED TRAUMA PANEL [facility])  Once,   URGENT       Question:  Specimen Source  Answer:  Urine, Clean Catch   10/21/23 1109           Antimicrobials/Microbiology: Anti-infectives (From admission, onward)    Start     Dose/Rate Route Frequency Ordered Stop   10/22/23 0600  ceFAZolin  (ANCEF ) IVPB 2g/100 mL premix  Status:  Discontinued        2 g 200 mL/hr over 30 Minutes Intravenous On call to O.R. 10/21/23 1404 10/22/23 1228   10/21/23 2200  doxycycline  (VIBRA -TABS) tablet 100 mg  Status:  Discontinued        100 mg Oral Every 12 hours 10/21/23 1915 10/21/23 2044   10/21/23 2200  doxycycline  (VIBRAMYCIN ) 100 mg in sodium chloride  0.9 % 250 mL IVPB        100 mg 125 mL/hr over 120  Minutes Intravenous Every 12 hours 10/21/23 2044 10/29/23 0959         Component Value Date/Time   SDES BLOOD BLOOD LEFT ARM 10/06/2023 1932   SPECREQUEST  10/06/2023 1932    BOTTLES DRAWN AEROBIC AND ANAEROBIC Blood Culture results may not be optimal due to an inadequate volume of blood received in culture bottles   CULT  10/06/2023 1932    NO GROWTH 5 DAYS Performed at [facility], [address], [place], [name] [address]    REPTSTATUS 10/11/2023 FINAL 10/06/2023 1932    Procedures: Procedure(s) (LRB): FIXATION, FRACTURE, INTERTROCHANTERIC, WITH INTRAMEDULLARY ROD (Left)   Mennie LAMY, MD [facility] 10/22/2023, 1:41 PM

## 2023-10-22 NOTE — Plan of Care (Signed)
  Problem: Education: Goal: Knowledge of General Education information will improve Description: Including pain rating scale, medication(s)/side effects and non-pharmacologic comfort measures Outcome: Progressing   Problem: Clinical Measurements: Goal: Will remain free from infection Outcome: Progressing   Problem: Clinical Measurements: Goal: Cardiovascular complication will be avoided Outcome: Progressing    Problem: Clinical Measurements: Goal: Respiratory complications will improve Outcome: Progressing   

## 2023-10-22 NOTE — Progress Notes (Signed)
 Initial Nutrition Assessment  DOCUMENTATION CODES:   Not applicable  INTERVENTION:   - Continue Ensure Plus High Protein po BID, each supplement provides 350 kcal and 20 grams of protein  - Add renal MVI daily given CKD stage IIIb  - Recommend Regular diet once diet advanced post-op  NUTRITION DIAGNOSIS:   Increased nutrient needs related to hip fracture, post-op healing, wound healing as evidenced by estimated needs.  GOAL:   Patient will meet greater than or equal to 90% of their needs  MONITOR:   PO intake, Supplement acceptance, Labs, Weight trends, Skin  REASON FOR ASSESSMENT:   Consult Hip fracture protocol  ASSESSMENT:   78 year old female who presented to the ED on 10/21/23 after a ground-level fall. PMH of T2DM, CKD stage IIIb, HFpEF, COPD. Pt admitted with left hip fracture.  Pt unavailable at time of RD visit. Noted plan for IMN of left hip fracture this morning with Orthopedics.  Unable to obtain diet and weight history at this time. Reviewed weight history in chart. Noted pt with a 15.4 kg or 14.8% weight loss from 06/05/23 to 10/21/23 (less than 5 months). This is severe and significant for timeframe. Additionally, nursing has documented that pt has non-pitting edema to BLE which indicates that current weight may be falsely elevated due to fluid. Unsure of pt's true dry weight. Pt is at risk for malnutrition and may already meet criteria, but RD unable to confirm without NFPE and/or detailed diet history.  Pt with increased needs related to hip fracture, existing wounds, and post-op healing. Will continue with Ensure Plus High Protein twice daily. RD will also add renal MVI daily given CKD IIIb. Recommend a Regular diet with no restrictions once diet advanced post-op.  Medications reviewed and include: Ensure Plus High Protein BID, SSI TID with meals, protonix , IV abx  Labs reviewed: sodium 134, creatinine 1.85, WBC 12.1 CBG's: 103-111 x 24 hours  NUTRITION -  FOCUSED PHYSICAL EXAM:  Unable to complete. Pt is in room at time of RD visit.  Diet Order:   Diet Order             Diet NPO time specified  Diet effective midnight                   EDUCATION NEEDS:   Not appropriate for education at this time  Skin:  Skin Assessment: Skin Integrity Issues: Other: wound (location not listed), wound infection LLE  Last BM:  10/20/23  Height:   Ht Readings from Last 1 Encounters:  10/22/23 5' 5 (1.651 m)    Weight:   Wt Readings from Last 1 Encounters:  10/22/23 88.5 kg    BMI:  Body mass index is 32.47 kg/m.  Estimated Nutritional Needs:   Kcal:  1700-1900  Protein:  85-100 grams  Fluid:  1.7-1.9 L    Mallie Satchel, MS, RD, LDN Registered Dietitian II Please see AMiON for contact information.

## 2023-10-22 NOTE — Progress Notes (Signed)
 Patient ID: Kerry Powell, female   DOB: 1946/01/20, 78 y.o.   MRN: [id number] I have seen the patient in the short stay holding area as she is awaiting cardiac clearance for surgery today.  Again, she does have a displaced left hip intertrochanteric fracture and we have recommended surgery with open reduction/intra fixation using a femoral nail and hip screw construct.  I did describe the surgery length in detail to the patient and her daughter yesterday and again today.  We talked about the risks and benefits of the surgery and what to expect from an intraoperative and postoperative standpoint.  Informed consent has been obtained and the left operative hip has been marked.  Hopefully she will be cleared for surgery today.  There is certainly significant risks involved regardless given her comorbidities and they understand this as well.

## 2023-10-22 NOTE — Anesthesia Preprocedure Evaluation (Addendum)
 Anesthesia Evaluation    Reviewed: Allergy & Precautions, Patient's Chart, lab work & pertinent test results  Airway        Dental   Pulmonary COPD,  COPD inhaler, former smoker          Cardiovascular hypertension, Pt. on home beta blockers and Pt. on medications + CAD       Neuro/Psych  Headaches PSYCHIATRIC DISORDERS Anxiety Depression Bipolar Disorder   TIACVA    GI/Hepatic Neg liver ROS, PUD,GERD  ,,  Endo/Other  diabetes  Patient on GLP-1 Agonist  Renal/GU CRFRenal disease     Musculoskeletal  (+) Arthritis ,    Abdominal  (+) + obese  Peds  Hematology  (+) Blood dyscrasia (Plavix )   Anesthesia Other Findings LEFT HIP FRACTURE  Reproductive/Obstetrics                              Anesthesia Physical Anesthesia Plan Anesthesia Quick Evaluation

## 2023-10-22 NOTE — Telephone Encounter (Signed)
 Spoke with Amy today.  PT has been placed on hold until patient she is released from hospital (and) possible rehab.

## 2023-10-22 NOTE — Progress Notes (Signed)
 Patient ID: Kerry Powell, female   DOB: December 20, 1945, 78 y.o.   MRN: [id number] Surgery had to be canceled today due to the need for medical optimization and cardiac clearance given the patient's comorbidities.  Hopefully she can be rescheduled by one of my orthopedic colleagues tomorrow if once she is cleared for surgery.

## 2023-10-22 NOTE — Consult Note (Addendum)
 Cardiology Consultation  Patient ID: JAZZMA NEIDHARDT MRN: [id number]; DOB: 16-Sep-1945  Admit date: 10/21/2023 Date of Consult: 10/22/2023  PCP:  Geofm Glade PARAS, MD   [facility] Providers Cardiologist:  Wilbert Bihari, MD     Patient Profile: MELA PERHAM is a 78 y.o. female with a hx of thoracic aortic aneurysm, CVA, PVD, type 2 diabetes, COPD, hyperlipidemia, coronary artery disease noted on CCTA, hypertension, chronic HFpEF, CKD stage 3b, atrial tachycardia, and bipolar disorder  who is being seen 10/22/2023 for the evaluation of preoperative evaluation at the request of Dr. Christobal.  History of Present Illness: Ms. Gootee has past medical history as stated above. She presented to [facility] ED on 10/21/2023 after sustaining a ground-level fall at home after losing her balance. She was found to have a left femur impacted, angulated intratrochanteric fracture. She has been seen by orthopedic surgery who was scheduled to undergo intramedullary nailing on 10/22/2023. Cardiology was asked to consult in the setting of preoperative cardiac risk assessment by the medicine service.  She was last seen by Josefa Beauvais, NP for preoperative evaluation for left knee arthroplasty. At this time she was denying anginal symptoms. Her recent cardiac workup included:  Stress test, 02/2022: normal, low-risk study, normal LV perfusion, normal LV regional and global systolic function   Long term monitor, 01/2022: multiple episodes of SVT consistent with atrial tachycardia, longest episode 1hr 56 min, 1 run NSVT, rare second degree AV type 1, rare ectopy   Coronary CTA, 06/2019: calcium  score 390 (84th percentile), 25-49% stenosis mid RCA, 25-45% stenosis mid LM  Echocardiogram, 03/2018: LVEF 60-65%, normal RV function, moderate LAE  After speaking with the patient and her daughter, they tell me that she has been unable to complete basic tasks without becoming short of breath as of recently. She does have  COPD and was recently hospitalized with COVID and found that she was more short of breath after this admission however her daughter notes that she has been unable to do most tasks without issues.   Past Medical History:  Diagnosis Date   Allergy    Anemia    Anxiety    Arthritis    BACK PAIN, CHRONIC 04/04/2007   Qualifier: Diagnosis of  By: [name], [name]    BIPOLAR AFFECTIVE DISORDER 04/04/2007   Qualifier: History of  By: [name], [name] E    CAD (coronary artery disease), native coronary artery    mild CAD with no obstructive disease by coronary CTA 06/2019   Cataract    removed both eyes   CHF (congestive heart failure) (HCC)    Chronic neck pain 04/30/2015   Cervical spondylosis [facility] Intermittent numbness and tingling in occipital region and headaches S/p 2 occipital nerve blocks - only temp relief no relief with gabapentin, ultram , brace MRI neck - facet arthrosis    CKD (chronic kidney disease) 04/26/2015   COPD (chronic obstructive pulmonary disease) (HCC)    pt denies - told this while was smoking -   Cough 04/26/2015   Depression 07/01/2017   Diabetes (HCC) 04/27/2015   Diagnosed 03/2015    Dizziness 06/10/2017   Dysphagia 04/26/2015   DYSPNEA 12/24/2007   Qualifier: Diagnosis of  By: [name]     Elevated TSH 08/09/2015   GASTRIC ULCER, ACUTE 04/07/2007   Annotation: EGD 04/07/07 Qualifier: Diagnosis of  By: [name], [name] E    GASTRITIS 04/07/2007   Annotation: EGD 04/07/07 Qualifier: Diagnosis  of  By: [name], NOLIA Pitts E    GERD (gastroesophageal reflux disease)    H/O renal cell carcinoma 05/23/2017   Dx 2010, s/p cryoablation Follows with urology - Dr [name]   HIATAL HERNIA 04/07/2007   Annotation: EGD 04/07/07 Qualifier: Diagnosis of  By: [name], NOLIA Pitts E    History of CVA (cerebrovascular accident) 04/26/2015   History of gout 04/26/2015   Hyperlipidemia    Hypertension    Hypoxia 03/06/2023    Irritable bowel syndrome 04/04/2007   Qualifier: Diagnosis of  By: [name], NOLIA Pitts BRAVO    Kidney tumor (benign)    Nausea and vomiting 09/09/2017   Occipital neuralgia 05/09/2016   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 Dr [name] in [place]   OSTEOARTHRITIS 04/04/2007   Qualifier: Diagnosis of  By: [name], NOLIA Pitts BRAVO    Personal history of colonic adenoma 06/07/2012   POSTNASAL DRIP SYNDROME 12/29/2007   Qualifier: Diagnosis of  By: [name], [name]     RESTLESS LEG SYNDROME 04/04/2007   Qualifier: Diagnosis of  By: [name], NOLIA Pitts BRAVO    RHINOSINUSITIS, 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   Qualifier: Diagnosis of  By: [name], NOLIA Pitts BRAVO    Sleep difficulties 09/09/2017   Snoring 04/26/2015   Stroke (HCC)    x2 2011   Thoracic aortic aneurysm (HCC) 02/06/2018   Seen on MRI 01/25/18 of thoracic spine ordered by [facility] -- referred to CTS to monitor  -- 3.5 cm   TIA (transient ischemic attack)    TOBACCO ABUSE 12/17/2007   Qualifier: Diagnosis of  By: [name], [name]     Trigeminal neuralgia of right side of face 02/08/2016   URI (upper respiratory infection) 02/08/2016   Vitamin D  deficiency 03/12/2018   Past Surgical History:  Procedure Laterality Date   ABDOMINAL HYSTERECTOMY     APPENDECTOMY     APPLICATION OF WOUND VAC Left 10/09/2023   Procedure: APPLICATION, WOUND VAC;  Surgeon: [name], MD;  Location: [facility] OR;  Service: Orthopedics;  Laterality: Left;   CATARACT EXTRACTION     COLONOSCOPY     CYSTOCELE REPAIR     ESOPHAGOGASTRODUODENOSCOPY     FRACTURE SURGERY  2007   INCISION AND DRAINAGE OF DEEP ABSCESS, CALF Left 10/09/2023   Procedure: INCISION AND DRAINAGE OF DEEP ABSCESS, CALF;  Surgeon: [name], MD;  Location: [facility] OR;  Service: Orthopedics;  Laterality: Left;   KIDNEY SURGERY     benign tumor removal   ORTHOPEDIC SURGERY     Wrist & elbow   POLYPECTOMY     RECTOCELE REPAIR     TONSILECTOMY, ADENOIDECTOMY, BILATERAL MYRINGOTOMY  AND TUBES Bilateral 1968   UPPER GASTROINTESTINAL ENDOSCOPY      Home Medications:  Prior to Admission medications   Medication Sig Start Date End Date Taking? Authorizing Provider  albuterol  (PROVENTIL ) (2.5 MG/3ML) 0.083% nebulizer solution USE 1 VIAL IN NEBULIZER EVERY 6 HOURS AND AS NEEDED FOR SHORT BREATH/WHEEZE.  Generic:  Ventolin  09/30/23   Meade Verdon RAMAN, MD  allopurinol  (ZYLOPRIM ) 300 MG tablet Take 1 tablet by mouth once daily 09/02/23   Geofm Glade PARAS, MD  amLODipine  (NORVASC ) 5 MG tablet Take 1 tablet (5 mg total) by mouth daily. 10/16/23   Geofm Glade PARAS, MD  budesonide -formoterol  (SYMBICORT ) 160-4.5 MCG/ACT inhaler Inhale 2 puffs into the lungs 2 (two) times daily. 10/13/23   Ghimire, Donalda HERO, MD  cetirizine  (ZYRTEC  ALLERGY) 10 MG tablet Take 10 mg  by mouth daily as needed for allergies or rhinitis. 05/06/19   [provider]  cholecalciferol (VITAMIN D3) 25 MCG (1000 UNIT) tablet Take 2,000 Units by mouth daily.    [provider]  clopidogrel  (PLAVIX ) 75 MG tablet Take 1 tablet by mouth once daily 09/02/23   Geofm Glade PARAS, MD  cyclobenzaprine  (FLEXERIL ) 5 MG tablet Take 1 tablet (5 mg total) by mouth 3 (three) times daily as needed. for muscle spams 03/31/23   Geofm Glade PARAS, MD  doxycycline  (VIBRA -TABS) 100 MG tablet Take 1 tablet (100 mg total) by mouth 2 (two) times daily for 16 days. 10/13/23 10/29/23  Ghimire, Donalda HERO, MD  DULoxetine  (CYMBALTA ) 30 MG capsule Take 2 capsules (60 mg total) by mouth daily. 10/16/23   Geofm Glade PARAS, MD  FARXIGA  10 MG TABS tablet Take 1 tablet (10 mg total) by mouth daily. Take 10 mg by mouth daily. 03/21/23   Geofm Glade PARAS, MD  ferrous sulfate  325 (65 FE) MG tablet Take 325 mg by mouth every other day.    [provider]  fluticasone  (FLONASE ) 50 MCG/ACT nasal spray Place 2 sprays into both nostrils daily. Patient taking differently: Place 2 sprays into both nostrils daily as needed for allergies. 10/03/23   Soldatova, Liuba, MD   furosemide  (LASIX ) 20 MG tablet Take 1 tablet (20 mg total) by mouth daily as needed for fluid or edema. 03/06/23   Geofm Glade PARAS, MD  metoprolol  tartrate (LOPRESSOR ) 25 MG tablet Take 1 tablet (25 mg total) by mouth 2 (two) times daily. May take extra 25 mg tablet for as needed palpitations 02/12/23   Odell Celinda Balo, MD  montelukast  (SINGULAIR ) 10 MG tablet TAKE 1 TABLET BY MOUTH ONCE DAILY AT BEDTIME 07/25/23   Burns, Glade PARAS, MD  MOUNJARO  5 MG/0.5ML Pen INJECT 1/2 (ONE-HALF) ML SUBCUTANEOUSLY  ONCE A WEEK 09/23/23   Burns, Glade PARAS, MD  oxyCODONE  (OXY IR/ROXICODONE ) 5 MG immediate release tablet Take 1 tablet (5 mg total) by mouth every 6 (six) hours as needed for moderate pain (pain score 4-6). 10/13/23   Ghimire, Donalda HERO, MD  pantoprazole  (PROTONIX ) 40 MG tablet Take 1 tablet (40 mg total) by mouth 2 (two) times daily before a meal. 02/01/23   Burns, Glade PARAS, MD  rosuvastatin  (CRESTOR ) 40 MG tablet Take 1 tablet by mouth once daily 09/02/23   Turner, Wilbert SAUNDERS, MD  sodium chloride  (OCEAN) 0.65 % SOLN nasal spray Place 1 spray into both nostrils as needed for congestion. 07/29/23   Desai, Nikita S, MD  telmisartan  (MICARDIS ) 40 MG tablet Take 1 tablet (40 mg total) by mouth daily. 08/26/23   Geofm Glade PARAS, MD  tiotropium (SPIRIVA  HANDIHALER) 18 MCG inhalation capsule Place 1 capsule (18 mcg total) into inhaler and inhale daily. 10/13/23 10/12/24  GhimireDonalda HERO, MD  traMADol  (ULTRAM ) 50 MG tablet Take 50 mg by mouth 3 (three) times daily as needed. for pain 09/18/23   [provider]    Scheduled Meds:  [MAR Hold] arformoterol   15 mcg Nebulization BID   [MAR Hold] budesonide  (PULMICORT ) nebulizer solution  0.25 mg Nebulization BID   chlorhexidine   60 mL Topical Once   [MAR Hold] feeding supplement  237 mL Oral BID BM   [MAR Hold] insulin  aspart  0-15 Units Subcutaneous TID WC   [MAR Hold] montelukast   10 mg Oral QHS   multivitamin  1 tablet Oral QHS   [MAR Hold] pantoprazole   40 mg  Oral BID AC   [  MAR Hold] revefenacin   175 mcg Nebulization Daily   [MAR Hold] rosuvastatin   40 mg Oral Daily   Continuous Infusions:   ceFAZolin  (ANCEF ) IV     [MAR Hold] doxycycline  (VIBRAMYCIN ) IV 100 mg (10/21/23 2310)   lactated ringers      PRN Meds: 0.9 % irrigation (POUR BTL), [MAR Hold] albuterol , [MAR Hold] bisacodyl , [MAR Hold] HYDROcodone -acetaminophen , [MAR Hold]  HYDROmorphone  (DILAUDID ) injection, insulin  aspart, [MAR Hold] lip balm, [MAR Hold] methocarbamol  **OR** [MAR Hold] methocarbamol  (ROBAXIN ) injection, [MAR Hold] polyethylene glycol  Allergies:    Allergies  Allergen Reactions   Gabapentin Other (See Comments)    Fluid retention    Clindamycin  Rash   Sulfa Antibiotics Nausea And Vomiting   Social History:   Social History   Socioeconomic History   Marital status: Married    Spouse name: Not on file   Number of children: 2   Years of education: Not on file   Highest education level: 12th grade  Occupational History   Occupation: Retired  Tobacco Use   Smoking status: Former    Current packs/day: 0.00    Average packs/day: 1 pack/day for 46.0 years (46.0 ttl pk-yrs)    Types: Cigarettes    Start date: 07/01/1963    Quit date: 06/30/2009    Years since quitting: 14.3    Passive exposure: Past   Smokeless tobacco: Never  Vaping Use   Vaping status: Never Used  Substance and Sexual Activity   Alcohol  use: No   Drug use: No   Sexual activity: Not Currently  Other Topics Concern   Not on file  Social History Narrative   Married, retired   1 son 1 daughter   2 caffeine/day   2 story home, but doesn't have to use stairs - son stays upstairs   Social Drivers of Health   Financial Resource Strain: Low Risk  (06/04/2023)   Overall Financial Resource Strain (CARDIA)    Difficulty of Paying Living Expenses: Not hard at all  Food Insecurity: No Food Insecurity (10/21/2023)   Hunger Vital Sign    Worried About Running Out of Food in the Last Year: Never  true    Ran Out of Food in the Last Year: Never true  Transportation Needs: No Transportation Needs (10/21/2023)   PRAPARE - Administrator, Civil Service (Medical): No    Lack of Transportation (Non-Medical): No  Physical Activity: Inactive (06/04/2023)   Exercise Vital Sign    Days of Exercise per Week: 0 days    Minutes of Exercise per Session: 40 min  Stress: Stress Concern Present (06/04/2023)   [employer] of Occupational Health - Occupational Stress Questionnaire    Feeling of Stress : To some extent  Social Connections: Socially Isolated (10/21/2023)   Social Connection and Isolation Panel    Frequency of Communication with Friends and Family: Once a week    Frequency of Social Gatherings with Friends and Family: Once a week    Attends Religious Services: Never    Database administrator or Organizations: No    Attends Banker Meetings: Never    Marital Status: Married  Catering manager Violence: Not At Risk (10/21/2023)   Humiliation, Afraid, Rape, and Kick questionnaire    Fear of Current or Ex-Partner: No    Emotionally Abused: No    Physically Abused: No    Sexually Abused: No    Family History:   Family History  Problem Relation Age of Onset  Congestive Heart Failure Mother    Cancer Mother        ovarian   Asthma Mother    Heart disease Mother    Heart disease Father    Diabetes Father    Kidney disease Father    Hyperlipidemia Father    Stroke Father    Diabetes Sister    Kidney disease Sister    Miscarriages / [place] Sister    Arthritis Maternal Grandmother    Hyperlipidemia Maternal Grandmother    Hypertension Maternal Grandmother    Obesity Maternal Grandmother    Arthritis Paternal Grandfather    Hypertension Paternal Grandfather    Kidney disease Paternal Grandfather    Diabetes Paternal Aunt    Diabetes Paternal Uncle    Learning disabilities Brother    Anxiety disorder Son    Diabetes Son    Learning  disabilities Son    Depression Maternal Aunt    Colon cancer Neg Hx    Colon polyps Neg Hx    Esophageal cancer Neg Hx    Rectal cancer Neg Hx    Stomach cancer Neg Hx     ROS:  Please see the history of present illness.  All other ROS reviewed and negative.     Physical Exam/Data: Vitals:   10/22/23 1158 10/22/23 1200 10/22/23 1203 10/22/23 1208  BP: 118/60     Pulse: 88 91 90 89  Resp: 18 (!) 27 13 13   Temp:      TempSrc:      SpO2: (!) 88% 93% (!) 88% (!) 89%  Weight:      Height:       Intake/Output Summary (Last 24 hours) at 10/22/2023 1214 Last data filed at 10/22/2023 0007 Gross per 24 hour  Intake 990 ml  Output 3300 ml  Net -2310 ml      10/22/2023   10:26 AM 10/21/2023   11:15 AM 10/13/2023    4:16 AM  Last 3 Weights  Weight (lbs) 195 lb 1.7 oz 195 lb 195 lb 11.2 oz  Weight (kg) 88.5 kg 88.451 kg 88.769 kg     Body mass index is 32.47 kg/m.   General:  in no acute distress, on oxygen HEENT: normal Neck: no JVD Vascular: No carotid bruits; Distal pulses 2+ bilaterally Cardiac:  normal S1, S2; RRR; no murmur  Lungs:  clear to auscultation bilaterally, no wheezing, rhonchi or rales  Abd: soft, nontender, no hepatomegaly  Ext: no edema Musculoskeletal:  No deformities Skin: warm and dry  Neuro:  no focal abnormalities noted Psych:  Normal affect   Telemetry:  Telemetry was personally reviewed and demonstrates:  sinus rhythm, H R90s  Relevant CV Studies:  Lexiscan , 03/09/2022   The study is normal. The study is low risk.   No ST deviation was noted.   LV perfusion is normal.   Left ventricular function is normal. End diastolic cavity size is normal.   Prior study available for comparison from 04/24/2018. No changes compared to prior study.   Low risk stress nuclear study with normal perfusion and normal left ventricular regional and global systolic function.  Long term monitor, 02/20/2022   Predominant rhythm was normal sinus rhythm with average  heart rate 97bpm and ranged from 65 to 142bpm   Muotiple episodes of SVT consistent with atrial tachycardia with longest episodes lasting 1 hour 56 min and the fastest episode 211bpm.   1 run of nonsustained ventricuclar tachycardia lasting 5 beats   Rare episodes of second  degree AV block Type 1 with lowes heart rate 27bpm occurring during sleeping hours.   Rare PACs, atrial couplest and triplets   Rare PVCs, ventricular couplest and triplets as well as bigeminal and trigeminal PVCs.  Coronary CTA, 07/02/2019 Coronary calcium  score of 390. This was 84th percentile for age and sex matched control. Normal coronary origin with right dominance. Mild atherosclerosis.  CAD-RADS 2. Consider non-atherosclerotic causes of chest pain. Consider preventive therapy and risk factor modification.  Echocardiogram, 04/24/2018 The left ventricle has normal systolic function with an ejection fraction of 60-65%. The cavity size was normal. There is mildly increased left ventricular wall thickness. Left ventricular diastolic Doppler parameters are consistent with impaired relaxation Elevated left ventricular end-diastolic pressure The E/e' is >84. No evidence of left ventricular regional wall motion abnormalities.  The right ventricle has normal systolic function. The cavity was normal. There is no increase in right ventricular wall thickness.  Left atrial size was moderately dilated.  The mitral valve is normal in structure.  The aortic valve is normal in structure.  The aortic root and ascending aorta are normal in size and structure. No evidence of left ventricular regional wall motion abnormalities   Laboratory Data: High Sensitivity Troponin:  No results for input(s): TROPONINIHS in the last 720 hours.   Chemistry Recent Labs  Lab 10/16/23 1530 10/21/23 1110 10/21/23 1121 10/22/23 0503  NA 137 137 139 134*  K 4.3 4.2 4.3 3.7  CL 98 109 107 103  CO2 32 20*  --  23  GLUCOSE 116* 147* 146* 103*   BUN 24* 22 25* 18  CREATININE 1.87* 1.97* 2.00* 1.85*  CALCIUM  9.1 9.1  --  8.8*  MG  --   --   --  1.7  GFRNONAA  --  26*  --  28*  ANIONGAP  --  8  --  8    Recent Labs  Lab 10/16/23 1530 10/21/23 1110 10/22/23 0503  PROT 7.7 7.1 6.6  ALBUMIN  3.8 3.1* 2.9*  AST 17 20 18   ALT 17 16 14   ALKPHOS 96 79 76  BILITOT 0.7 0.8 0.9   Lipids No results for input(s): CHOL, TRIG, HDL, LABVLDL, LDLCALC, CHOLHDL in the last 168 hours.  Hematology Recent Labs  Lab 10/16/23 1530 10/21/23 1110 10/21/23 1121 10/22/23 0503  WBC 9.6 15.1*  --  12.1*  RBC 5.17* 4.80  --  4.43  HGB 15.7* 14.6 16.0* 13.5  HCT 48.8* 45.8 47.0* 42.0  MCV 94.3 95.4  --  94.8  MCH  --  30.4  --  30.5  MCHC 32.2 31.9  --  32.1  RDW 14.6 14.2  --  14.2  PLT 333.0 304  --  277   Thyroid  No results for input(s): TSH, FREET4 in the last 168 hours.  BNP Recent Labs  Lab 10/22/23 0503  BNP 95.2    DDimer No results for input(s): DDIMER in the last 168 hours.  Radiology/Studies:  DG Chest Port 1 View Result Date: 10/21/2023 CLINICAL DATA:  Trauma, fall EXAM: PORTABLE CHEST 1 VIEW COMPARISON:  10/06/2023 FINDINGS: Underpenetrated AP portable chest radiograph. Mild cardiomegaly. No acute abnormality of the lungs. No acute osseous findings. IMPRESSION: Underpenetrated AP portable chest radiograph. Mild cardiomegaly. No acute abnormality of the lungs. Electronically Signed   By: Marolyn JONETTA Jaksch M.D.   On: 10/21/2023 12:36   DG Pelvis Portable Result Date: 10/21/2023 CLINICAL DATA:  Trauma, fall EXAM: PORTABLE PELVIS 1-2 VIEWS COMPARISON:  None Available. FINDINGS: Impacted, angulated  intratrochanteric fractures of the left femur. No displaced fracture of the pelvis or proximal right femur in single frontal view. IMPRESSION: 1. Impacted, angulated intratrochanteric fractures of the left femur. 2. No displaced fracture of the pelvis or proximal right femur in single frontal view. Electronically Signed    By: Marolyn JONETTA Jaksch M.D.   On: 10/21/2023 12:33   DG Elbow Complete Left Result Date: 10/21/2023 CLINICAL DATA:  Fall, pain EXAM: LEFT ELBOW - COMPLETE 3+ VIEW COMPARISON:  None Available. FINDINGS: No displaced fracture or dislocation of the left elbow. Obliquity of lateral view provided for review limits assessment for effusion. No obvious effusion. Soft tissues unremarkable. IMPRESSION: No displaced fracture or dislocation of the left elbow. Obliquity of lateral view provided for review limits assessment for effusion. No obvious effusion. Electronically Signed   By: Marolyn JONETTA Jaksch M.D.   On: 10/21/2023 12:28   CT CHEST ABDOMEN PELVIS W CONTRAST Result Date: 10/21/2023 CLINICAL DATA:  Trauma, fall EXAM: CT CHEST, ABDOMEN, AND PELVIS WITH CONTRAST TECHNIQUE: Multidetector CT imaging of the chest, abdomen and pelvis was performed following the standard protocol during bolus administration of intravenous contrast. RADIATION DOSE REDUCTION: This exam was performed according to the departmental dose-optimization program which includes automated exposure control, adjustment of the mA and/or kV according to patient size and/or use of iterative reconstruction technique. CONTRAST:  60mL OMNIPAQUE  IOHEXOL  350 MG/ML SOLN COMPARISON:  None Available. FINDINGS: CT CHEST FINDINGS Cardiovascular: Severe aortic atherosclerosis with a large burden of irregular mural thrombus particularly throughout the descending vessel. Normal heart size. Three-vessel coronary artery calcifications. No pericardial effusion. Mediastinum/Nodes: No enlarged mediastinal, hilar, or axillary lymph nodes. Thyroid  gland, trachea, and esophagus demonstrate no significant findings. Lungs/Pleura: Moderate centrilobular emphysema. No pleural effusion or pneumothorax. Musculoskeletal: No chest wall abnormality. No acute osseous findings. Multiple subacute, callused fractures of the anterolateral left ribs (series 3, image 43). CT ABDOMEN PELVIS FINDINGS  Hepatobiliary: No solid liver abnormality is seen. No gallstones, gallbladder wall thickening, or biliary dilatation. Pancreas: Unremarkable. No pancreatic ductal dilatation or surrounding inflammatory changes. Spleen: Normal in size without significant abnormality. Adrenals/Urinary Tract: Adrenal glands are unremarkable. Severely atrophic left kidney. Scarring and atrophy of the posterior cortex of the right kidney. No calculi or hydronephrosis. Bladder is unremarkable. Stomach/Bowel: Stomach is within normal limits. Appendix not clearly visualized. No evidence of bowel wall thickening, distention, or inflammatory changes. Vascular/Lymphatic: Severe aortic atherosclerosis. No enlarged abdominal or pelvic lymph nodes. Reproductive: Hysterectomy. Other: No abdominal wall hernia or abnormality. No ascites. Musculoskeletal: Impacted, angulated intratrochanteric fractures of the left femur (series 3, image 120, series 6, image 55) IMPRESSION: 1. Impacted, angulated intratrochanteric fractures of the left femur. 2. Multiple subacute, callused fractures of the anterolateral left ribs. 3. No other CT evidence of acute traumatic injury to the chest, abdomen, or pelvis. 4. Severe aortic atherosclerosis with a large burden of irregular mural thrombus particularly throughout the descending thoracic aorta. 5. Coronary artery disease. Aortic Atherosclerosis (ICD10-I70.0) and Emphysema (ICD10-J43.9). Electronically Signed   By: Marolyn JONETTA Jaksch M.D.   On: 10/21/2023 12:27   CT HEAD WO CONTRAST Result Date: 10/21/2023 CLINICAL DATA:  Provided history: Head trauma, moderate/severe. Head CT Polytrauma, blunt. EXAM: CT HEAD WITHOUT CONTRAST CT CERVICAL SPINE WITHOUT CONTRAST TECHNIQUE: Multidetector CT imaging of the head and cervical spine was performed following the standard protocol without intravenous contrast. Multiplanar CT image reconstructions of the cervical spine were also generated. RADIATION DOSE REDUCTION: This exam was  performed according to the departmental dose-optimization program which includes  automated exposure control, adjustment of the mA and/or kV according to patient size and/or use of iterative reconstruction technique. COMPARISON:  Head CT 01/21/2022.  Cervical spine CT 01/21/2022. FINDINGS: CT HEAD FINDINGS Brain: No age-advanced or lobar predominant cerebral atrophy. Chronic lacunar infarcts again demonstrated within the right caudate nucleus and right thalamus. Prominent perivascular space versus chronic lacunar infarct within the left lentiform nucleus, unchanged. Background mild cerebral white matter chronic small vessel ischemic disease, better appreciated on the prior brain MRI 06/09/2017. There is no acute intracranial hemorrhage. No demarcated cortical infarct. No extra-axial fluid collection. No evidence of an intracranial mass. No midline shift. Vascular: No hyperdense vessel.  Atherosclerotic calcifications. Skull: No calvarial fracture or aggressive osseous lesion. Sinuses/Orbits: No mass or acute finding within the imaged orbits. Minimal mucosal thickening within the left frontal sinus and within bilateral ethmoid air cells. Other: Large left-sided scalp hematoma. CT CERVICAL SPINE FINDINGS Alignment: Mild levocurvature of the cervical spine. Nonspecific reversal of the expected cervical doses. 2 mm C2-C3 grade 1 anterolisthesis. 4 mm C4-C5 grade 1 anterolisthesis. Slight grade 1 anterolisthesis at C5-C6, C6-C7, C7-T1, T1-T2 and T3-T4. Skull base and vertebrae: The basion-dental and atlanto-dental intervals are maintained.No evidence of acute fracture to the cervical spine. Developmental incomplete segmentation of the T2-T3 vertebrae. Subcentimeter sclerotic focus within the left aspect of the T3 vertebral body, nonspecific but likely reflecting a bone island. Soft tissues and spinal canal: No prevertebral fluid or swelling. No visible canal hematoma. Disc levels: Cervical spondylosis with multilevel disc  space narrowing, disc bulges/central disc protrusions and overall advanced facet arthropathy. No appreciable high-grade spinal canal stenosis. Multilevel bony neural foraminal narrowing. Degenerative changes also present at the atlantooccipital articulations (right greater than left) and at the C1-C2 articulation. Upper chest: No consolidation within the imaged lung apices. No visible pneumothorax. Biapical pleuroparenchymal scarring. Emphysema. Aortic atherosclerosis. IMPRESSION: CT head: 1.  No evidence of an acute intracranial abnormality. 2. Large left scalp hematoma. 3. 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 CT cervical spine: 1. No evidence of an acute cervical spine fracture. 2. Nonspecific reversal of the expected cervical lordosis. 3. Grade 1 anterolisthesis at C2-C3, C4-C5, C5-C6, C6-C7, C7-T1, T1-T2 and T3-T4. 4. Cervical spondylosis as described. 5. Aortic Atherosclerosis (ICD10-I70.0) and Emphysema (ICD10-J43.9). Electronically Signed   By: Rockey Childs D.O.   On: 10/21/2023 12:06   CT CERVICAL SPINE WO CONTRAST Result Date: 10/21/2023 CLINICAL DATA:  Provided history: Head trauma, moderate/severe. Head CT Polytrauma, blunt. EXAM: CT HEAD WITHOUT CONTRAST CT CERVICAL SPINE WITHOUT CONTRAST TECHNIQUE: Multidetector CT imaging of the head and cervical spine was performed following the standard protocol without intravenous contrast. Multiplanar CT image reconstructions of the cervical spine were also generated. RADIATION DOSE REDUCTION: This exam was performed according to the departmental dose-optimization program which includes automated exposure control, adjustment of the mA and/or kV according to patient size and/or use of iterative reconstruction technique. COMPARISON:  Head CT 01/21/2022.  Cervical spine CT 01/21/2022. FINDINGS: CT HEAD FINDINGS Brain: No age-advanced or lobar predominant cerebral atrophy. Chronic lacunar infarcts again  demonstrated within the right caudate nucleus and right thalamus. Prominent perivascular space versus chronic lacunar infarct within the left lentiform nucleus, unchanged. Background mild cerebral white matter chronic small vessel ischemic disease, better appreciated on the prior brain MRI 06/09/2017. There is no acute intracranial hemorrhage. No demarcated cortical infarct. No extra-axial fluid collection. No evidence of an intracranial mass. No midline shift. Vascular: No hyperdense vessel.  Atherosclerotic calcifications. Skull:  No calvarial fracture or aggressive osseous lesion. Sinuses/Orbits: No mass or acute finding within the imaged orbits. Minimal mucosal thickening within the left frontal sinus and within bilateral ethmoid air cells. Other: Large left-sided scalp hematoma. CT CERVICAL SPINE FINDINGS Alignment: Mild levocurvature of the cervical spine. Nonspecific reversal of the expected cervical doses. 2 mm C2-C3 grade 1 anterolisthesis. 4 mm C4-C5 grade 1 anterolisthesis. Slight grade 1 anterolisthesis at C5-C6, C6-C7, C7-T1, T1-T2 and T3-T4. Skull base and vertebrae: The basion-dental and atlanto-dental intervals are maintained.No evidence of acute fracture to the cervical spine. Developmental incomplete segmentation of the T2-T3 vertebrae. Subcentimeter sclerotic focus within the left aspect of the T3 vertebral body, nonspecific but likely reflecting a bone island. Soft tissues and spinal canal: No prevertebral fluid or swelling. No visible canal hematoma. Disc levels: Cervical spondylosis with multilevel disc space narrowing, disc bulges/central disc protrusions and overall advanced facet arthropathy. No appreciable high-grade spinal canal stenosis. Multilevel bony neural foraminal narrowing. Degenerative changes also present at the atlantooccipital articulations (right greater than left) and at the C1-C2 articulation. Upper chest: No consolidation within the imaged lung apices. No visible  pneumothorax. Biapical pleuroparenchymal scarring. Emphysema. Aortic atherosclerosis. IMPRESSION: CT head: 1.  No evidence of an acute intracranial abnormality. 2. Large left scalp hematoma. 3. 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 CT cervical spine: 1. No evidence of an acute cervical spine fracture. 2. Nonspecific reversal of the expected cervical lordosis. 3. Grade 1 anterolisthesis at C2-C3, C4-C5, C5-C6, C6-C7, C7-T1, T1-T2 and T3-T4. 4. Cervical spondylosis as described. 5. Aortic Atherosclerosis (ICD10-I70.0) and Emphysema (ICD10-J43.9). Electronically Signed   By: Rockey Childs D.O.   On: 10/21/2023 12:06   Assessment and Plan:  Preoperative evaluation Coronary artery disease per CCTA  Hypertension Hyperlipidemia  Chronic HFpEF Patient sustained mechanical fall at home after losing balance Found to have intratrochanteric left femur fracture Scheduled for intramedullary nailing with ortho surgery 8/26 Cardiology asked to consult in the setting of preoperative risk assessment  BNP normal  Chest CT this admission: severe aortic atherosclerosis with a large burden of irregular mural thrombus -- vascular surgery curb sided and will follow as an outpatient   CCTA 06/2019: calcium  score 390 (84th percentile), 25-49% stenosis mid RCA, 25-45% stenosis mid LM Stress test 02/2022: normal LV perfusion, low risk study, normal LV function Home meds: amlodipine  7.5 mg daily, Farxiga  10 mg daily, PO Lasix  20 mg PRN, Lopressor  25 mg BID, Crestor  40 mg daily, telmisartan  80 mg daily  Ordering updated echocardiogram Ordering updated Lexiscan , to be scheduled tomorrow  NPO at midnight for stress  Pending results of echo and stress test will decide if she needs cath prior to undergoing orthopedic surgery   Per primary Fall Left femur fracture  CKD COPD  Mood disorders GERD Diabetes Anemia of chronic disease PAD on Plavix   Risk Assessment/Risk  Scores:      [place]  Heart Association (NYHA) Functional Class NYHA Class III    For questions or updates, please contact [facility] Please consult [email or web address] for contact info under    Signed, Waddell DELENA Donath, PA-C  10/22/2023 12:14 PM  ATTENDING ATTESTATION:  After conducting a review of all available clinical information with the care team, interviewing the patient, and performing a physical exam, I agree with the findings and plan described in this note.   GEN: No acute distress.   HEENT:  MMM, no JVD, no scleral icterus Cardiac: RRR, no murmurs, rubs, or gallops.  Respiratory: Clear to auscultation bilaterally. GI: Soft, nontender, non-distended  MS: No edema; No deformity. Neuro:  Nonfocal  Vasc:  +2 radial pulses  The patient is a 78 year old female with a history of multivessel mild coronary artery disease on coronary CTA 2021, reassuring stress test in January 2024, type 2 diabetes, history of stroke, and peripheral vascular disease who presents with a left hip fracture.  Over the last several months she has noticed shortness of breath with exertion.  This may be related to COVID infection that she sustained as well.  When speaking with her daughter it is unclear exactly what her functional capacity is.  For this reason we will refer her for an ischemic evaluation and obtain an echocardiogram.  We will try to obtain a coronary CTA.  She had a coronary CTA in 2021 which demonstrated only mild disease.  Only of high risk findings are found including multivessel disease, left main disease, or proximal LAD disease would further cardiac evaluation be necessary.  If a coronary CTA cannot be obtained in a timely manner, we will consider a Lexiscan .  Lurena Red, MD Pager [phone]  Addendum:  Due to GFR<30, will defer coronary CTA and obtain Lexiscan  stress test.

## 2023-10-22 NOTE — Telephone Encounter (Signed)
 Spoke with Amy today.

## 2023-10-22 NOTE — Progress Notes (Signed)
  Echocardiogram 2D Echocardiogram has been performed.  Thea Norlander 10/22/2023, 4:23 PM

## 2023-10-23 ENCOUNTER — Encounter (HOSPITAL_COMMUNITY): Payer: Self-pay | Admitting: Internal Medicine

## 2023-10-23 ENCOUNTER — Inpatient Hospital Stay (HOSPITAL_COMMUNITY)

## 2023-10-23 DIAGNOSIS — J9601 Acute respiratory failure with hypoxia: Secondary | ICD-10-CM

## 2023-10-23 DIAGNOSIS — W19XXXA Unspecified fall, initial encounter: Secondary | ICD-10-CM | POA: Diagnosis not present

## 2023-10-23 DIAGNOSIS — Y92009 Unspecified place in unspecified non-institutional (private) residence as the place of occurrence of the external cause: Secondary | ICD-10-CM | POA: Diagnosis not present

## 2023-10-23 LAB — NM MYOCAR MULTI W/SPECT W/WALL MOTION / EF
Estimated workload: 0
Exercise duration (min): 5 min
MPHR: 142 {beats}/min
Peak HR: 113 {beats}/min
Percent HR: 79 %
Rest HR: 96 {beats}/min
ST Depression (mm): 0 mm

## 2023-10-23 LAB — BASIC METABOLIC PANEL WITH GFR
Anion gap: 13 (ref 5–15)
BUN: 17 mg/dL (ref 8–23)
CO2: 20 mmol/L — ABNORMAL LOW (ref 22–32)
Calcium: 8.4 mg/dL — ABNORMAL LOW (ref 8.9–10.3)
Chloride: 103 mmol/L (ref 98–111)
Creatinine, Ser: 1.72 mg/dL — ABNORMAL HIGH (ref 0.44–1.00)
GFR, Estimated: 30 mL/min — ABNORMAL LOW (ref >60)
Glucose, Bld: 124 mg/dL — ABNORMAL HIGH (ref 70–99)
Potassium: 3.9 mmol/L (ref 3.5–5.1)
Sodium: 136 mmol/L (ref 135–145)

## 2023-10-23 LAB — CBC
HCT: 39.8 % (ref 36.0–46.0)
Hemoglobin: 12.9 g/dL (ref 12.0–15.0)
MCH: 30.9 pg (ref 26.0–34.0)
MCHC: 32.4 g/dL (ref 30.0–36.0)
MCV: 95.2 fL (ref 80.0–100.0)
Platelets: 259 K/uL (ref 150–400)
RBC: 4.18 MIL/uL (ref 3.87–5.11)
RDW: 14.3 % (ref 11.5–15.5)
WBC: 13.4 K/uL — ABNORMAL HIGH (ref 4.0–10.5)
nRBC: 0 % (ref 0.0–0.2)

## 2023-10-23 LAB — SURGICAL PCR SCREEN
MRSA, PCR: NEGATIVE
Staphylococcus aureus: NEGATIVE

## 2023-10-23 LAB — GLUCOSE, CAPILLARY
Glucose-Capillary: 97 mg/dL (ref 70–99)
Glucose-Capillary: 111 mg/dL — ABNORMAL HIGH (ref 70–99)
Glucose-Capillary: 146 mg/dL — ABNORMAL HIGH (ref 70–99)

## 2023-10-23 MED ORDER — TECHNETIUM TC 99M TETROFOSMIN IV KIT
10.7000 | PACK | Freq: Once | INTRAVENOUS | Status: AC | PRN
  Administered 2023-10-23: 10.7 via INTRAVENOUS

## 2023-10-23 MED ORDER — REGADENOSON 0.4 MG/5ML IV SOLN
INTRAVENOUS | Status: AC
  Filled 2023-10-23: qty 5

## 2023-10-23 MED ORDER — REGADENOSON 0.4 MG/5ML IV SOLN
0.4000 mg | Freq: Once | INTRAVENOUS | Status: AC
  Administered 2023-10-23: 0.4 mg via INTRAVENOUS

## 2023-10-23 MED ORDER — TECHNETIUM TC 99M TETROFOSMIN IV KIT
29.1000 | PACK | Freq: Once | INTRAVENOUS | Status: AC | PRN
  Administered 2023-10-23: 29.1 via INTRAVENOUS

## 2023-10-23 NOTE — Plan of Care (Signed)
  Problem: Health Behavior/Discharge Planning: Goal: Ability to manage health-related needs will improve Outcome: Progressing   Problem: Clinical Measurements: Goal: Ability to maintain clinical measurements within normal limits will improve Outcome: Progressing Goal: Will remain free from infection Outcome: Progressing Goal: Diagnostic test results will improve Outcome: Progressing Goal: Respiratory complications will improve Outcome: Progressing Goal: Cardiovascular complication will be avoided Outcome: Progressing   Problem: Activity: Goal: Risk for activity intolerance will decrease Outcome: Progressing   Problem: Nutrition: Goal: Adequate nutrition will be maintained Outcome: Progressing   Problem: Coping: Goal: Level of anxiety will decrease Outcome: Progressing   Problem: Elimination: Goal: Will not experience complications related to bowel motility Outcome: Progressing Goal: Will not experience complications related to urinary retention Outcome: Progressing   Problem: Pain Managment: Goal: General experience of comfort will improve and/or be controlled Outcome: Progressing   Problem: Safety: Goal: Ability to remain free from injury will improve Outcome: Progressing   Problem: Skin Integrity: Goal: Risk for impaired skin integrity will decrease Outcome: Progressing   Problem: Education: Goal: Ability to describe self-care measures that may prevent or decrease complications (Diabetes Survival Skills Education) will improve Outcome: Progressing Goal: Individualized Educational Video(s) Outcome: Progressing   Problem: Coping: Goal: Ability to adjust to condition or change in health will improve Outcome: Progressing   Problem: Fluid Volume: Goal: Ability to maintain a balanced intake and output will improve Outcome: Progressing   Problem: Health Behavior/Discharge Planning: Goal: Ability to identify and utilize available resources and services will  improve Outcome: Progressing Goal: Ability to manage health-related needs will improve Outcome: Progressing   Problem: Metabolic: Goal: Ability to maintain appropriate glucose levels will improve Outcome: Progressing   Problem: Nutritional: Goal: Maintenance of adequate nutrition will improve Outcome: Progressing Goal: Progress toward achieving an optimal weight will improve Outcome: Progressing   Problem: Skin Integrity: Goal: Risk for impaired skin integrity will decrease Outcome: Progressing   Problem: Tissue Perfusion: Goal: Adequacy of tissue perfusion will improve Outcome: Progressing   Problem: Education: Goal: Verbalization of understanding the information provided (i.e., activity precautions, restrictions, etc) will improve Outcome: Progressing Goal: Individualized Educational Video(s) Outcome: Progressing   Problem: Activity: Goal: Ability to ambulate and perform ADLs will improve Outcome: Progressing   Problem: Clinical Measurements: Goal: Postoperative complications will be avoided or minimized Outcome: Progressing   Problem: Self-Concept: Goal: Ability to maintain and perform role responsibilities to the fullest extent possible will improve Outcome: Progressing   Problem: Pain Management: Goal: Pain level will decrease Outcome: Progressing

## 2023-10-23 NOTE — Progress Notes (Addendum)
 Rounding Note   Patient Name: Kerry Powell Date of Encounter: 10/23/2023  [facility] Cardiologist: Wilbert Bihari, MD   Subjective Doing okay today. No chest pain. Chronic shortness of breath.  Scheduled Meds:  arformoterol   15 mcg Nebulization BID   budesonide  (PULMICORT ) nebulizer solution  0.25 mg Nebulization BID   Chlorhexidine  Gluconate Cloth  6 each Topical Daily   feeding supplement  237 mL Oral BID BM   insulin  aspart  0-15 Units Subcutaneous TID WC   montelukast   10 mg Oral QHS   multivitamin  1 tablet Oral QHS   pantoprazole   40 mg Oral BID AC   revefenacin   175 mcg Nebulization Daily   rosuvastatin   40 mg Oral Daily   Continuous Infusions:  sodium chloride  75 mL/hr at 10/23/23 0600   doxycycline  (VIBRAMYCIN ) IV 100 mg (10/23/23 0855)   PRN Meds: albuterol , bisacodyl , HYDROcodone -acetaminophen , HYDROmorphone  (DILAUDID ) injection, lip balm, polyethylene glycol   Vital Signs  Vitals:   10/22/23 2300 10/23/23 0431 10/23/23 0813 10/23/23 0821  BP: 102/68 (!) 86/60  130/70  Pulse:  88 88 94  Resp:  18  18  Temp:  97.7 F (36.5 C)  98.2 F (36.8 C)  TempSrc:  Oral    SpO2: 92% 90% 92% 90%  Weight:      Height:        Intake/Output Summary (Last 24 hours) at 10/23/2023 0958 Last data filed at 10/23/2023 0600 Gross per 24 hour  Intake 1696.25 ml  Output 450 ml  Net 1246.25 ml      10/22/2023   10:26 AM 10/21/2023   11:15 AM 10/13/2023    4:16 AM  Last 3 Weights  Weight (lbs) 195 lb 1.7 oz 195 lb 195 lb 11.2 oz  Weight (kg) 88.5 kg 88.451 kg 88.769 kg      Telemetry Unable to assess as patient is off floor, during lexiscan  she was in sinus rhythm - Personally Reviewed  Physical Exam GEN: No acute distress laying in stretcher with Rico in place   Neck: No JVD Cardiac: RRR, no murmurs, rubs, or gallops.  Respiratory: Clear to auscultation bilaterally. GI: Soft, nontender, non-distended  MS: No edema; No deformity. Neuro:  Nonfocal  Psych:  Normal affect   Cardiac Studies CT C/A/P 8/25 IMPRESSION: 1. Impacted, angulated intratrochanteric fractures of the left femur. 2. Multiple subacute, callused fractures of the anterolateral left ribs. 3. No other CT evidence of acute traumatic injury to the chest, abdomen, or pelvis. 4. Severe aortic atherosclerosis with a large burden of irregular mural thrombus particularly throughout the descending thoracic aorta. 5. Coronary artery disease.  Echocardiogram 8/26 IMPRESSIONS     1. Left ventricular ejection fraction, by estimation, is 60 to 65%. The  left ventricle has normal function. Left ventricular endocardial border  not optimally defined to evaluate regional wall motion. There is mild  concentric left ventricular  hypertrophy. Left ventricular diastolic parameters are consistent with  Grade I diastolic dysfunction (impaired relaxation).   2. Right ventricular systolic function is normal. The right ventricular  size is normal. There is normal pulmonary artery systolic pressure. The  estimated right ventricular systolic pressure is 21.8 mmHg.   3. Left atrial size was mildly dilated.   4. The mitral valve is normal in structure. No evidence of mitral valve  regurgitation. No evidence of mitral stenosis.   5. The aortic valve is tricuspid. There is mild calcification of the  aortic valve. Aortic valve regurgitation is not visualized. No  aortic  stenosis is present.   6. The inferior vena cava is normal in size with greater than 50%  respiratory variability, suggesting right atrial pressure of 3 mmHg.   7. Technically difficult study with poor acoustic windows.   Patient Profile   78 y.o. female with a past medical history of TAA, CVA, PVD, T2DM, COPD, hyperlipidemia, CAD noted on CCTA, hypertension, chronic HFpEF, CKD stage 3b, atrial tachycardia, and bipolar disorder who presented to [facility] on 8/25 after a mechanical ground level fall causing left femur impaction with  angulated intratrochanteric fracture. Cardiology was consulted for perioperative cardiac risk evaluation.   Assessment & Plan  Preoperative evaluation Coronary artery disease per CCTA  CCTA 06/2019 Ca score 390 with mild-moderate CAD of RCA and LM. Negative stress test in 2024.  Patient reported recent progressive SOB on exertion. CT chest this admission showing severe aortic atherosclerosis with large burden of irregular mural thrombus in the aorta. Vascular to see outpatient.  Echo this admission shows LVEF 60-65% with mild LVH, cannot assess for RWMA. G1 DD. Mildly dilated LA.   Patient to have lexiscan  today given DOE to rule out coronary etiology, if notable defect will most likely pursue cardiac catheterization prior to surgery.   Chronic HFpE Could be contributing to progressive SOB Echo shows unchanged EF. Grade I DD.  On exam appears, euvolemic   Telmisartan  80 mg daily on hold for hypotension Farxiga  10 mg daily on hold for pending surgery   Hyperlipidemia  Continue crestor  40mg   Hx of hypertension Hypotension Home meds: amlodipine  7.5 mg daily, , Lopressor  25 mg BID,  & telmisartan  80 mg daily  BP: 90/50 after lexiscan  administration, patient was asymptomatic Continue to hold home antihypertensives.   Per primary Fall Left femur fracture  CKD-at baseline COPD on oxygen  Mood disorders GERD Diabetes Anemia of chronic disease PAD on Plavix    For questions or updates, please contact [facility] Please consult [email or web address] for contact info under     Signed, Leontine LOISE Salen, PA-C  10/23/2023, 9:58 AM     ATTENDING ATTESTATION:  After conducting a review of all available clinical information with the care team, interviewing the patient, and performing a physical exam, I agree with the findings and plan described in this note.   GEN: No acute distress, AO x 3 HEENT:  MMM, no JVD, no scleral icterus Cardiac: RRR, no murmurs, rubs, or gallops.   Respiratory: Clear to auscultation bilaterally. GI: Soft, nontender, non-distended  MS: No edema; No deformity. Neuro:  Nonfocal  Vasc:  +2 radial pulses  Patient with complaints of left hip and leg pain but denies any shortness of breath or chest pain.  Echocardiogram demonstrates preserved LV function with no significant valvular abnormalities.  Lexiscan  stress test demonstrated no ECG abnormalities, the imaging portion was obscured by motion artifact and may suggest an inferolateral defect consistent with intermediate risk findings.  Given the lack of high risk stress test findings, normal ejection fraction, and planned orthopedic surgery which is not high risk from a cardiovascular perspective, would proceed with orthopedic surgery as planned.  Cardiology will sign off, call with questions.  Lurena Red, MD Pager [phone]

## 2023-10-23 NOTE — Progress Notes (Signed)
 PROGRESS NOTE    Kerry Powell  FMW:[id number] DOB: Mar 01, 1945 DOA: 10/21/2023 PCP: Geofm Glade PARAS, MD   Brief Narrative:   78 year old female with history of T2DM CKD 3B chronic HFpEF, COPD-on home oxygen 2 L since last discharge admitted with ground-level fall. Recently admitted and discharged on August 17 after sepsis and right lower extremity cellulitis abscess treatment and was debrided by Dr. Harden.  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  Lung showed moderate centrilobular emphysema no pleural effusion or pneumothorax. Orthopedics consulted and plan is for surgery once preoperative clearance is done. Cardiology is consulted and plan is for lexiscan  stress test to be done today.  Assessment & Plan:  Principal Problem:   Fall at home, initial encounter Active Problems:   COPD (chronic obstructive pulmonary disease) (HCC)   History of CVA (cerebrovascular accident)   GERD (gastroesophageal reflux disease)   DM2 (diabetes mellitus, type 2) (HCC)   Anxiety   Hyperlipidemia associated with type 2 diabetes mellitus (HCC)   CAD (coronary artery disease), native coronary artery   PAD (peripheral artery disease) (HCC)   Chronic diastolic CHF (congestive heart failure) (HCC)   Chronic respiratory failure with hypoxia (HCC)   Closed comminuted intertrochanteric fracture of proximal end of left femur (HCC)   Impacted angulated intertrochanteric fracture of left femur Fall at home, initial encounter Preop evaluation: Orthopedic has been consulted for operative intervention.  Patient does have history of CVA, CHF, CKD, diabetes and is high risk.  Currently remains in significant  oxygen.  CT imaging shows significant atherosclerotic disease including coronary, will consult cardiology for preop evaluation.she is on Ventimask will wean down oxygen to home setting as tolerated. She had CT coronary scan on 2021 and last echo in 2020-with normal LV function  Cardiology consulted for preoperative clearance. ECHO showed EF 60-65% and Grade 1 diastolic dysfunction. Lexiscan  stress test to be done today. Prior lexiscan  done on 03/09/2022 was unremarkable. Coronary CTA done on 07/02/19 showed coronary calcium  score of 390 and mild atherosclerosis.   COPD/emphysema Acute on chronic hypoxic resp failure: Recently discharged on supplemental oxygen, currently needing Ventimask.  Will keep her on Pulmicort /Brovana /Yupelri  and bronchodilators as needed, I-S.  Weaned to nasal cannula at 4 L.  ABG obtained no CO2 retention pCO2 61, BNP ordered   Recent admission for LLE purulent cellulitis/abscess/sepsis: Was treated with antibiotics and debridement by Dr. Harden Cont wound care   CAD HLD PAD: on Plavix  and Crestor  CT scan showing large burden of irregular mural thrombus particularly descending thoracic aorta/coronary atherosclerosis: Plavix  on hold.  Dr. Silver from [facility] was curbsided who advised outpatient follow-up and start [facility] once able   Hypertension: BP stable, PTA on metoprolol , telmisartan .  BP soft holding meds   Chronic HFpEF: NYHA II   Imaging shows emphysema no pleural effusion or edema.  Resume home Lasix  as able   Anemia of chronic disease: Monitor hemoglobin resume home iron supplement   GERD: Continue PPI   Diabetes mellitus type 2 with hyperglycemia: continue SSI   CKD stage IIIb: B/l creatinine around 1.9-2, stable.  Avoid nephrotoxic medication monitor   Class I Obesity w/ Body mass index is 32.45 kg/m.: Will benefit with PCP follow-up, weight loss,healthy lifestyle and outpatient sleep eval  if not done.  Disposition: Lives at home with her husband.  May need SNF after hip surgery.    DVT prophylaxis: SCDs Start: 10/21/23 1412     Code Status: Full Code Family Communication: None at the bedside  Status is: Inpatient Remains inpatient appropriate because: left hip fracture    Subjective:  Complaining of left hip pain. She was coming out of the bedroom and fell all of a sudden. She lives at home with her husband, he is 68 years old and has unsteady gait. Her son and daughter help them both.  Examination:  General exam: Appears to be in mild distress  Respiratory system: Clear to auscultation. Respiratory effort normal. Cardiovascular system: S1 & S2 heard, RRR. No JVD, murmurs, rubs, gallops or clicks. No pedal edema. Gastrointestinal system: Abdomen is nondistended, soft and nontender. No organomegaly or masses felt. Normal bowel sounds heard. Central nervous system: Alert and oriented. No focal neurological deficits. Extremities: Left leg is shortened and externally rotated. Skin: No rashes, lesions or ulcers Psychiatry: Judgement and insight appear normal. Mood & affect appropriate.      Diet Orders (From admission, onward)     Start     Ordered   10/23/23 0722  Diet NPO time specified  Diet effective now        10/23/23 0722            Objective: Vitals:   10/22/23 2300 10/23/23 0431 10/23/23 0813 10/23/23 0821  BP: 102/68 (!) 86/60  130/70  Pulse:  88 88 94  Resp:  18  18  Temp:  97.7 F (36.5 C)  98.2 F (36.8 C)  TempSrc:  Oral    SpO2: 92% 90% 92% 90%  Weight:      Height:        Intake/Output Summary (Last 24 hours) at 10/23/2023 1049 Last data filed at 10/23/2023 0600 Gross per 24 hour  Intake 1696.25 ml  Output 450 ml  Net 1246.25 ml   Filed Weights   10/21/23 1115 10/22/23 1026  Weight: 88.5 kg 88.5 kg    Scheduled Meds:  arformoterol   15 mcg Nebulization BID   budesonide  (PULMICORT ) nebulizer solution  0.25 mg Nebulization BID   Chlorhexidine  Gluconate Cloth  6 each Topical Daily    feeding supplement  237 mL Oral BID BM   insulin  aspart  0-15 Units Subcutaneous TID WC   montelukast   10 mg Oral QHS   multivitamin  1 tablet Oral QHS   pantoprazole   40 mg Oral BID AC   revefenacin   175 mcg Nebulization Daily   rosuvastatin   40 mg Oral Daily   Continuous Infusions:  sodium chloride  75 mL/hr at 10/23/23 0600   doxycycline  (VIBRAMYCIN ) IV 100 mg (10/23/23 0855)    Nutritional status Signs/Symptoms: estimated needs Interventions: Ensure Enlive (each supplement provides 350kcal and 20 grams of protein), MVI, Refer to RD note for recommendations Body mass index is 32.47 kg/m.  Data Reviewed:   CBC: Recent Labs  Lab 10/16/23 1530 10/21/23 1110 10/21/23 1121 10/22/23 0503 10/23/23 0424  WBC 9.6 15.1*  --  12.1* 13.4*  NEUTROABS 6.0  --   --  8.8*  --   HGB 15.7* 14.6 16.0* 13.5 12.9  HCT 48.8* 45.8 47.0* 42.0 39.8  MCV 94.3 95.4  --  94.8 95.2  PLT 333.0 304  --  277 259   Basic Metabolic Panel: Recent Labs  Lab 10/16/23 1530 10/21/23 1110 10/21/23 1121 10/22/23 0503 10/23/23 0424  NA 137  137 139 134* 136  K 4.3 4.2 4.3 3.7 3.9  CL 98 109 107 103 103  CO2 32 20*  --  23 20*  GLUCOSE 116* 147* 146* 103* 124*  BUN 24* 22 25* 18 17  CREATININE 1.87* 1.97* 2.00* 1.85* 1.72*  CALCIUM  9.1 9.1  --  8.8* 8.4*  MG  --   --   --  1.7  --   PHOS  --   --   --  3.9  --    GFR: Estimated Creatinine Clearance: 29.6 mL/min (A) (by C-G formula based on SCr of 1.72 mg/dL (H)). Liver Function Tests: Recent Labs  Lab 10/16/23 1530 10/21/23 1110 10/22/23 0503  AST 17 20 18   ALT 17 16 14   ALKPHOS 96 79 76  BILITOT 0.7 0.8 0.9  PROT 7.7 7.1 6.6  ALBUMIN  3.8 3.1* 2.9*   No results for input(s): LIPASE, AMYLASE in the last 168 hours. No results for input(s): AMMONIA in the last 168 hours. Coagulation Profile: Recent Labs  Lab 10/21/23 1110  INR 1.0   Cardiac Enzymes: No results for input(s): CKTOTAL, CKMB, CKMBINDEX, TROPONINI in the  last 168 hours. BNP (last 3 results) No results for input(s): PROBNP in the last 8760 hours. HbA1C: No results for input(s): HGBA1C in the last 72 hours. CBG: Recent Labs  Lab 10/22/23 0743 10/22/23 1030 10/22/23 1657 10/22/23 2051 10/23/23 0846  GLUCAP 111* 127* 95 106* 97   Lipid Profile: No results for input(s): CHOL, HDL, LDLCALC, TRIG, CHOLHDL, LDLDIRECT in the last 72 hours. Thyroid  Function Tests: No results for input(s): TSH, T4TOTAL, FREET4, T3FREE, THYROIDAB in the last 72 hours. Anemia Panel: No results for input(s): VITAMINB12, FOLATE, FERRITIN, TIBC, IRON, RETICCTPCT in the last 72 hours. Sepsis Labs: Recent Labs  Lab 10/21/23 1122  LATICACIDVEN 1.1    Recent Results (from the past 240 hours)  Surgical pcr screen     Status: None   Collection Time: 10/23/23  1:05 AM   Specimen: Nasal Mucosa; Nasal Swab  Result Value Ref Range Status   MRSA, PCR NEGATIVE NEGATIVE Final   Staphylococcus aureus NEGATIVE NEGATIVE Final    Comment: (NOTE) The Xpert SA Assay (FDA approved for NASAL specimens in patients 48 years of age and older), is one component of a comprehensive surveillance program. It is not intended to diagnose infection nor to guide or monitor treatment. Performed at [facility], [address], [place], [name] [address]          Radiology Studies: ECHOCARDIOGRAM COMPLETE Result Date: 10/22/2023    ECHOCARDIOGRAM REPORT   Patient Name:   Kerry Powell Date of Exam: 10/22/2023 Medical Rec #:  [id number]      Height:       65.0 in Accession #:    [id number]     Weight:       195.1 lb Date of Birth:  [birth year]      BSA:          1.957 m Patient Age:    78 years       BP:           105/73 mmHg Patient Gender: F              HR:           88 bpm. Exam Location:  Inpatient Procedure: 2D Echo, Cardiac Doppler and Color Doppler (Both Spectral and Color            Flow  Doppler were utilized during procedure).  Indications:    Chest Pain R07.9  History:        Patient has prior history of Echocardiogram examinations, most                 recent 04/24/2018. CHF, CAD, PAD, COPD and CKD, stage 4,                 Signs/Symptoms:Shortness of Breath and Syncope; Risk                 Factors:Hypertension, Diabetes, Dyslipidemia and Current Smoker.  Sonographer:    Thea Norlander RCS Referring Phys: [id number] TAYLOR A PARCELLS IMPRESSIONS  1. Left ventricular ejection fraction, by estimation, is 60 to 65%. The left ventricle has normal function. Left ventricular endocardial border not optimally defined to evaluate regional wall motion. There is mild concentric left ventricular hypertrophy. Left ventricular diastolic parameters are consistent with Grade I diastolic dysfunction (impaired relaxation).  2. Right ventricular systolic function is normal. The right ventricular size is normal. There is normal pulmonary artery systolic pressure. The estimated right ventricular systolic pressure is 21.8 mmHg.  3. Left atrial size was mildly dilated.  4. The mitral valve is normal in structure. No evidence of mitral valve regurgitation. No evidence of mitral stenosis.  5. The aortic valve is tricuspid. There is mild calcification of the aortic valve. Aortic valve regurgitation is not visualized. No aortic stenosis is present.  6. The inferior vena cava is normal in size with greater than 50% respiratory variability, suggesting right atrial pressure of 3 mmHg.  7. Technically difficult study with poor acoustic windows. FINDINGS  Left Ventricle: Left ventricular ejection fraction, by estimation, is 60 to 65%. The left ventricle has normal function. Left ventricular endocardial border not optimally defined to evaluate regional wall motion. The left ventricular internal cavity size was normal in size. There is mild concentric left ventricular hypertrophy. Left ventricular diastolic parameters are consistent with Grade I diastolic dysfunction  (impaired relaxation). Right Ventricle: The right ventricular size is normal. Right vetricular wall thickness was not well visualized. Right ventricular systolic function is normal. There is normal pulmonary artery systolic pressure. The tricuspid regurgitant velocity is 2.17 m/s, and with an assumed right atrial pressure of 3 mmHg, the estimated right ventricular systolic pressure is 21.8 mmHg. Left Atrium: Left atrial size was mildly dilated. Right Atrium: Right atrial size was normal in size. Pericardium: There is no evidence of pericardial effusion. Mitral Valve: The mitral valve is normal in structure. Mild mitral annular calcification. No evidence of mitral valve regurgitation. No evidence of mitral valve stenosis. Tricuspid Valve: The tricuspid valve is normal in structure. Tricuspid valve regurgitation is trivial. Aortic Valve: The aortic valve is tricuspid. There is mild calcification of the aortic valve. Aortic valve regurgitation is not visualized. No aortic stenosis is present. Aortic valve peak gradient measures 5.9 mmHg. Pulmonic Valve: The pulmonic valve was not well visualized. Pulmonic valve regurgitation is not visualized. Aorta: The aortic root is normal in size and structure. Venous: The inferior vena cava is normal in size with greater than 50% respiratory variability, suggesting right atrial pressure of 3 mmHg. IAS/Shunts: No atrial level shunt detected by color flow Doppler.  LEFT VENTRICLE PLAX 2D LVIDd:         3.70 cm   Diastology LVIDs:         2.50 cm   LV e' medial:    12.40 cm/s LV PW:  1.40 cm   LV E/e' medial:  5.0 LV IVS:        1.10 cm   LV e' lateral:   12.60 cm/s LVOT diam:     2.10 cm   LV E/e' lateral: 4.9 LV SV:         60 LV SV Index:   30 LVOT Area:     3.46 cm  IVC IVC diam: 1.70 cm LEFT ATRIUM           Index LA diam:      4.50 cm 2.30 cm/m LA Vol (A4C): 38.9 ml 19.87 ml/m  AORTIC VALVE AV Area (Vmax): 2.82 cm AV Vmax:        121.00 cm/s AV Peak Grad:   5.9 mmHg  LVOT Vmax:      98.50 cm/s LVOT Vmean:     63.400 cm/s LVOT VTI:       0.172 m  AORTA Ao Root diam: 3.50 cm Ao Asc diam:  3.60 cm MITRAL VALVE                TRICUSPID VALVE MV Area (PHT): 4.10 cm     TR Peak grad:   18.8 mmHg MV Decel Time: 185 msec     TR Vmax:        217.00 cm/s MV E velocity: 61.80 cm/s MV A velocity: 120.00 cm/s  SHUNTS MV E/A ratio:  0.52         Systemic VTI:  0.17 m                             Systemic Diam: 2.10 cm Dalton McleanMD Electronically signed by Ezra Kanner Signature Date/Time: 10/22/2023/5:56:13 PM    Final    DG Chest Port 1 View Result Date: 10/21/2023 CLINICAL DATA:  Trauma, fall EXAM: PORTABLE CHEST 1 VIEW COMPARISON:  10/06/2023 FINDINGS: Underpenetrated AP portable chest radiograph. Mild cardiomegaly. No acute abnormality of the lungs. No acute osseous findings. IMPRESSION: Underpenetrated AP portable chest radiograph. Mild cardiomegaly. No acute abnormality of the lungs. Electronically Signed   By: Marolyn JONETTA Jaksch M.D.   On: 10/21/2023 12:36   DG Pelvis Portable Result Date: 10/21/2023 CLINICAL DATA:  Trauma, fall EXAM: PORTABLE PELVIS 1-2 VIEWS COMPARISON:  None Available. FINDINGS: Impacted, angulated intratrochanteric fractures of the left femur. No displaced fracture of the pelvis or proximal right femur in single frontal view. IMPRESSION: 1. Impacted, angulated intratrochanteric fractures of the left femur. 2. No displaced fracture of the pelvis or proximal right femur in single frontal view. Electronically Signed   By: Marolyn JONETTA Jaksch M.D.   On: 10/21/2023 12:33   DG Elbow Complete Left Result Date: 10/21/2023 CLINICAL DATA:  Fall, pain EXAM: LEFT ELBOW - COMPLETE 3+ VIEW COMPARISON:  None Available. FINDINGS: No displaced fracture or dislocation of the left elbow. Obliquity of lateral view provided for review limits assessment for effusion. No obvious effusion. Soft tissues unremarkable. IMPRESSION: No displaced fracture or dislocation of the left elbow.  Obliquity of lateral view provided for review limits assessment for effusion. No obvious effusion. Electronically Signed   By: Marolyn JONETTA Jaksch M.D.   On: 10/21/2023 12:28   CT CHEST ABDOMEN PELVIS W CONTRAST Result Date: 10/21/2023 CLINICAL DATA:  Trauma, fall EXAM: CT CHEST, ABDOMEN, AND PELVIS WITH CONTRAST TECHNIQUE: Multidetector CT imaging of the chest, abdomen and pelvis was performed following the standard protocol during bolus administration of intravenous contrast. RADIATION DOSE REDUCTION: This exam  was performed according to the departmental dose-optimization program which includes automated exposure control, adjustment of the mA and/or kV according to patient size and/or use of iterative reconstruction technique. CONTRAST:  60mL OMNIPAQUE  IOHEXOL  350 MG/ML SOLN COMPARISON:  None Available. FINDINGS: CT CHEST FINDINGS Cardiovascular: Severe aortic atherosclerosis with a large burden of irregular mural thrombus particularly throughout the descending vessel. Normal heart size. Three-vessel coronary artery calcifications. No pericardial effusion. Mediastinum/Nodes: No enlarged mediastinal, hilar, or axillary lymph nodes. Thyroid  gland, trachea, and esophagus demonstrate no significant findings. Lungs/Pleura: Moderate centrilobular emphysema. No pleural effusion or pneumothorax. Musculoskeletal: No chest wall abnormality. No acute osseous findings. Multiple subacute, callused fractures of the anterolateral left ribs (series 3, image 43). CT ABDOMEN PELVIS FINDINGS Hepatobiliary: No solid liver abnormality is seen. No gallstones, gallbladder wall thickening, or biliary dilatation. Pancreas: Unremarkable. No pancreatic ductal dilatation or surrounding inflammatory changes. Spleen: Normal in size without significant abnormality. Adrenals/Urinary Tract: Adrenal glands are unremarkable. Severely atrophic left kidney. Scarring and atrophy of the posterior cortex of the right kidney. No calculi or hydronephrosis.  Bladder is unremarkable. Stomach/Bowel: Stomach is within normal limits. Appendix not clearly visualized. No evidence of bowel wall thickening, distention, or inflammatory changes. Vascular/Lymphatic: Severe aortic atherosclerosis. No enlarged abdominal or pelvic lymph nodes. Reproductive: Hysterectomy. Other: No abdominal wall hernia or abnormality. No ascites. Musculoskeletal: Impacted, angulated intratrochanteric fractures of the left femur (series 3, image 120, series 6, image 55) IMPRESSION: 1. Impacted, angulated intratrochanteric fractures of the left femur. 2. Multiple subacute, callused fractures of the anterolateral left ribs. 3. No other CT evidence of acute traumatic injury to the chest, abdomen, or pelvis. 4. Severe aortic atherosclerosis with a large burden of irregular mural thrombus particularly throughout the descending thoracic aorta. 5. Coronary artery disease. Aortic Atherosclerosis (ICD10-I70.0) and Emphysema (ICD10-J43.9). Electronically Signed   By: Marolyn JONETTA Jaksch M.D.   On: 10/21/2023 12:27   CT HEAD WO CONTRAST Result Date: 10/21/2023 CLINICAL DATA:  Provided history: Head trauma, moderate/severe. Head CT Polytrauma, blunt. EXAM: CT HEAD WITHOUT CONTRAST CT CERVICAL SPINE WITHOUT CONTRAST TECHNIQUE: Multidetector CT imaging of the head and cervical spine was performed following the standard protocol without intravenous contrast. Multiplanar CT image reconstructions of the cervical spine were also generated. RADIATION DOSE REDUCTION: This exam was performed according to the departmental dose-optimization program which includes automated exposure control, adjustment of the mA and/or kV according to patient size and/or use of iterative reconstruction technique. COMPARISON:  Head CT 01/21/2022.  Cervical spine CT 01/21/2022. FINDINGS: CT HEAD FINDINGS Brain: No age-advanced or lobar predominant cerebral atrophy. Chronic lacunar infarcts again demonstrated within the right caudate nucleus and  right thalamus. Prominent perivascular space versus chronic lacunar infarct within the left lentiform nucleus, unchanged. Background mild cerebral white matter chronic small vessel ischemic disease, better appreciated on the prior brain MRI 06/09/2017. There is no acute intracranial hemorrhage. No demarcated cortical infarct. No extra-axial fluid collection. No evidence of an intracranial mass. No midline shift. Vascular: No hyperdense vessel.  Atherosclerotic calcifications. Skull: No calvarial fracture or aggressive osseous lesion. Sinuses/Orbits: No mass or acute finding within the imaged orbits. Minimal mucosal thickening within the left frontal sinus and within bilateral ethmoid air cells. Other: Large left-sided scalp hematoma. CT CERVICAL SPINE FINDINGS Alignment: Mild levocurvature of the cervical spine. Nonspecific reversal of the expected cervical doses. 2 mm C2-C3 grade 1 anterolisthesis. 4 mm C4-C5 grade 1 anterolisthesis. Slight grade 1 anterolisthesis at C5-C6, C6-C7, C7-T1, T1-T2 and T3-T4. Skull base and vertebrae: The basion-dental and atlanto-dental  intervals are maintained.No evidence of acute fracture to the cervical spine. Developmental incomplete segmentation of the T2-T3 vertebrae. Subcentimeter sclerotic focus within the left aspect of the T3 vertebral body, nonspecific but likely reflecting a bone island. Soft tissues and spinal canal: No prevertebral fluid or swelling. No visible canal hematoma. Disc levels: Cervical spondylosis with multilevel disc space narrowing, disc bulges/central disc protrusions and overall advanced facet arthropathy. No appreciable high-grade spinal canal stenosis. Multilevel bony neural foraminal narrowing. Degenerative changes also present at the atlantooccipital articulations (right greater than left) and at the C1-C2 articulation. Upper chest: No consolidation within the imaged lung apices. No visible pneumothorax. Biapical pleuroparenchymal scarring. Emphysema.  Aortic atherosclerosis. IMPRESSION: CT head: 1.  No evidence of an acute intracranial abnormality. 2. Large left scalp hematoma. 3. 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 CT cervical spine: 1. No evidence of an acute cervical spine fracture. 2. Nonspecific reversal of the expected cervical lordosis. 3. Grade 1 anterolisthesis at C2-C3, C4-C5, C5-C6, C6-C7, C7-T1, T1-T2 and T3-T4. 4. Cervical spondylosis as described. 5. Aortic Atherosclerosis (ICD10-I70.0) and Emphysema (ICD10-J43.9). Electronically Signed   By: Rockey Childs D.O.   On: 10/21/2023 12:06   CT CERVICAL SPINE WO CONTRAST Result Date: 10/21/2023 CLINICAL DATA:  Provided history: Head trauma, moderate/severe. Head CT Polytrauma, blunt. EXAM: CT HEAD WITHOUT CONTRAST CT CERVICAL SPINE WITHOUT CONTRAST TECHNIQUE: Multidetector CT imaging of the head and cervical spine was performed following the standard protocol without intravenous contrast. Multiplanar CT image reconstructions of the cervical spine were also generated. RADIATION DOSE REDUCTION: This exam was performed according to the departmental dose-optimization program which includes automated exposure control, adjustment of the mA and/or kV according to patient size and/or use of iterative reconstruction technique. COMPARISON:  Head CT 01/21/2022.  Cervical spine CT 01/21/2022. FINDINGS: CT HEAD FINDINGS Brain: No age-advanced or lobar predominant cerebral atrophy. Chronic lacunar infarcts again demonstrated within the right caudate nucleus and right thalamus. Prominent perivascular space versus chronic lacunar infarct within the left lentiform nucleus, unchanged. Background mild cerebral white matter chronic small vessel ischemic disease, better appreciated on the prior brain MRI 06/09/2017. There is no acute intracranial hemorrhage. No demarcated cortical infarct. No extra-axial fluid collection. No evidence of an intracranial mass. No  midline shift. Vascular: No hyperdense vessel.  Atherosclerotic calcifications. Skull: No calvarial fracture or aggressive osseous lesion. Sinuses/Orbits: No mass or acute finding within the imaged orbits. Minimal mucosal thickening within the left frontal sinus and within bilateral ethmoid air cells. Other: Large left-sided scalp hematoma. CT CERVICAL SPINE FINDINGS Alignment: Mild levocurvature of the cervical spine. Nonspecific reversal of the expected cervical doses. 2 mm C2-C3 grade 1 anterolisthesis. 4 mm C4-C5 grade 1 anterolisthesis. Slight grade 1 anterolisthesis at C5-C6, C6-C7, C7-T1, T1-T2 and T3-T4. Skull base and vertebrae: The basion-dental and atlanto-dental intervals are maintained.No evidence of acute fracture to the cervical spine. Developmental incomplete segmentation of the T2-T3 vertebrae. Subcentimeter sclerotic focus within the left aspect of the T3 vertebral body, nonspecific but likely reflecting a bone island. Soft tissues and spinal canal: No prevertebral fluid or swelling. No visible canal hematoma. Disc levels: Cervical spondylosis with multilevel disc space narrowing, disc bulges/central disc protrusions and overall advanced facet arthropathy. No appreciable high-grade spinal canal stenosis. Multilevel bony neural foraminal narrowing. Degenerative changes also present at the atlantooccipital articulations (right greater than left) and at the C1-C2 articulation. Upper chest: No consolidation within the imaged lung apices. No visible pneumothorax. Biapical pleuroparenchymal scarring. Emphysema. Aortic atherosclerosis. IMPRESSION:  CT head: 1.  No evidence of an acute intracranial abnormality. 2. Large left scalp hematoma. 3. 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 CT cervical spine: 1. No evidence of an acute cervical spine fracture. 2. Nonspecific reversal of the expected cervical lordosis. 3. Grade 1 anterolisthesis at C2-C3, C4-C5,  C5-C6, C6-C7, C7-T1, T1-T2 and T3-T4. 4. Cervical spondylosis as described. 5. Aortic Atherosclerosis (ICD10-I70.0) and Emphysema (ICD10-J43.9). Electronically Signed   By: Rockey Childs D.O.   On: 10/21/2023 12:06        LOS: 2 days   Time spent= 41 mins    Deliliah Room, MD [facility]  If 7PM-7AM, please contact night-coverage  10/23/2023, 10:49 AM

## 2023-10-23 NOTE — Plan of Care (Signed)
   Problem: Education: Goal: Knowledge of General Education information will improve Description: Including pain rating scale, medication(s)/side effects and non-pharmacologic comfort measures Outcome: Completed/Met

## 2023-10-24 ENCOUNTER — Inpatient Hospital Stay (HOSPITAL_COMMUNITY)

## 2023-10-24 ENCOUNTER — Inpatient Hospital Stay (HOSPITAL_COMMUNITY): Admitting: Anesthesiology

## 2023-10-24 ENCOUNTER — Encounter (HOSPITAL_COMMUNITY): Payer: Self-pay | Admitting: Internal Medicine

## 2023-10-24 ENCOUNTER — Other Ambulatory Visit: Payer: Self-pay

## 2023-10-24 ENCOUNTER — Encounter (HOSPITAL_COMMUNITY)
Admission: EM | Disposition: A | Discharge status: Skilled Nursing Facility | Payer: Self-pay | Source: Home / Self Care | Attending: Internal Medicine

## 2023-10-24 DIAGNOSIS — I5032 Chronic diastolic (congestive) heart failure: Secondary | ICD-10-CM | POA: Diagnosis not present

## 2023-10-24 DIAGNOSIS — S72142A Displaced intertrochanteric fracture of left femur, initial encounter for closed fracture: Secondary | ICD-10-CM

## 2023-10-24 DIAGNOSIS — N184 Chronic kidney disease, stage 4 (severe): Secondary | ICD-10-CM | POA: Diagnosis not present

## 2023-10-24 DIAGNOSIS — I130 Hypertensive heart and chronic kidney disease with heart failure and stage 1 through stage 4 chronic kidney disease, or unspecified chronic kidney disease: Secondary | ICD-10-CM

## 2023-10-24 DIAGNOSIS — J9601 Acute respiratory failure with hypoxia: Secondary | ICD-10-CM | POA: Diagnosis not present

## 2023-10-24 DIAGNOSIS — Y92009 Unspecified place in unspecified non-institutional (private) residence as the place of occurrence of the external cause: Secondary | ICD-10-CM | POA: Diagnosis not present

## 2023-10-24 DIAGNOSIS — W19XXXA Unspecified fall, initial encounter: Secondary | ICD-10-CM | POA: Diagnosis not present

## 2023-10-24 HISTORY — PX: INTRAMEDULLARY (IM) NAIL INTERTROCHANTERIC: SHX5875

## 2023-10-24 LAB — CBC
HCT: 40.4 % (ref 36.0–46.0)
Hemoglobin: 13 g/dL (ref 12.0–15.0)
MCH: 30.4 pg (ref 26.0–34.0)
MCHC: 32.2 g/dL (ref 30.0–36.0)
MCV: 94.6 fL (ref 80.0–100.0)
Platelets: 254 K/uL (ref 150–400)
RBC: 4.27 MIL/uL (ref 3.87–5.11)
RDW: 14.4 % (ref 11.5–15.5)
WBC: 12.1 K/uL — ABNORMAL HIGH (ref 4.0–10.5)
nRBC: 0 % (ref 0.0–0.2)

## 2023-10-24 LAB — BASIC METABOLIC PANEL WITH GFR
Anion gap: 9 (ref 5–15)
BUN: 14 mg/dL (ref 8–23)
CO2: 22 mmol/L (ref 22–32)
Calcium: 8.6 mg/dL — ABNORMAL LOW (ref 8.9–10.3)
Chloride: 103 mmol/L (ref 98–111)
Creatinine, Ser: 1.6 mg/dL — ABNORMAL HIGH (ref 0.44–1.00)
GFR, Estimated: 33 mL/min — ABNORMAL LOW (ref >60)
Glucose, Bld: 112 mg/dL — ABNORMAL HIGH (ref 70–99)
Potassium: 3.9 mmol/L (ref 3.5–5.1)
Sodium: 134 mmol/L — ABNORMAL LOW (ref 135–145)

## 2023-10-24 LAB — GLUCOSE, CAPILLARY
Glucose-Capillary: 136 mg/dL — ABNORMAL HIGH (ref 70–99)
Glucose-Capillary: 126 mg/dL — ABNORMAL HIGH (ref 70–99)
Glucose-Capillary: 134 mg/dL — ABNORMAL HIGH (ref 70–99)
Glucose-Capillary: 300 mg/dL — ABNORMAL HIGH (ref 70–99)
Glucose-Capillary: 280 mg/dL — ABNORMAL HIGH (ref 70–99)

## 2023-10-24 SURGERY — FIXATION, FRACTURE, INTERTROCHANTERIC, WITH INTRAMEDULLARY ROD
Anesthesia: General | Site: Hip | Laterality: Left

## 2023-10-24 MED ORDER — MIDAZOLAM HCL 2 MG/2ML IJ SOLN
INTRAMUSCULAR | Status: AC
  Filled 2023-10-24: qty 2

## 2023-10-24 MED ORDER — OXYCODONE HCL 5 MG PO TABS: 5.0000 mg | ORAL_TABLET | Freq: Once | ORAL | Status: DC | PRN

## 2023-10-24 MED ORDER — CEFAZOLIN SODIUM-DEXTROSE 2-4 GM/100ML-% IV SOLN
2.0000 g | Freq: Four times a day (QID) | INTRAVENOUS | Status: AC
  Administered 2023-10-24 – 2023-10-25 (×2): 2 g via INTRAVENOUS
  Filled 2023-10-24 (×2): qty 100

## 2023-10-24 MED ORDER — LIDOCAINE 2% (20 MG/ML) 5 ML SYRINGE
INTRAMUSCULAR | Status: DC | PRN
  Administered 2023-10-24: 60 mg via INTRAVENOUS

## 2023-10-24 MED ORDER — FENTANYL CITRATE (PF) 100 MCG/2ML IJ SOLN
25.0000 ug | INTRAMUSCULAR | Status: DC | PRN
  Administered 2023-10-24 (×2): 25 ug via INTRAVENOUS

## 2023-10-24 MED ORDER — CHLORHEXIDINE GLUCONATE 4 % EX SOLN: 60.0000 mL | Freq: Once | CUTANEOUS | Status: DC

## 2023-10-24 MED ORDER — SUGAMMADEX SODIUM 200 MG/2ML IV SOLN
INTRAVENOUS | Status: DC | PRN
  Administered 2023-10-24: 177 mg via INTRAVENOUS

## 2023-10-24 MED ORDER — FENTANYL CITRATE (PF) 250 MCG/5ML IJ SOLN
INTRAMUSCULAR | Status: AC
  Filled 2023-10-24: qty 5

## 2023-10-24 MED ORDER — CHLORHEXIDINE GLUCONATE 0.12 % MT SOLN: 15.0000 mL | Freq: Once | OROMUCOSAL | Status: AC

## 2023-10-24 MED ORDER — ORAL CARE MOUTH RINSE: 15.0000 mL | Freq: Once | OROMUCOSAL | Status: AC

## 2023-10-24 MED ORDER — ONDANSETRON HCL 4 MG/2ML IJ SOLN: 4.0000 mg | Freq: Four times a day (QID) | INTRAMUSCULAR | Status: DC | PRN

## 2023-10-24 MED ORDER — PROPOFOL 10 MG/ML IV BOLUS
INTRAVENOUS | Status: AC
  Filled 2023-10-24: qty 20

## 2023-10-24 MED ORDER — DOCUSATE SODIUM 100 MG PO CAPS
100.0000 mg | ORAL_CAPSULE | Freq: Two times a day (BID) | ORAL | Status: DC
  Administered 2023-10-24 – 2023-10-31 (×14): 100 mg via ORAL
  Filled 2023-10-24 (×14): qty 1

## 2023-10-24 MED ORDER — INSULIN ASPART 100 UNIT/ML IJ SOLN: 0.0000 [IU] | INTRAMUSCULAR | Status: DC | PRN

## 2023-10-24 MED ORDER — TRANEXAMIC ACID-NACL 1000-0.7 MG/100ML-% IV SOLN
1000.0000 mg | Freq: Once | INTRAVENOUS | Status: AC
  Administered 2023-10-24: 1000 mg via INTRAVENOUS
  Filled 2023-10-24: qty 100

## 2023-10-24 MED ORDER — ALBUMIN HUMAN 5 % IV SOLN: INTRAVENOUS | Status: DC | PRN

## 2023-10-24 MED ORDER — ROCURONIUM BROMIDE 10 MG/ML (PF) SYRINGE
PREFILLED_SYRINGE | INTRAVENOUS | Status: AC
  Filled 2023-10-24: qty 30

## 2023-10-24 MED ORDER — METOCLOPRAMIDE HCL 5 MG PO TABS: 5.0000 mg | ORAL_TABLET | Freq: Three times a day (TID) | ORAL | Status: DC | PRN

## 2023-10-24 MED ORDER — CHLORHEXIDINE GLUCONATE 0.12 % MT SOLN
OROMUCOSAL | Status: AC
  Administered 2023-10-24: 15 mL via OROMUCOSAL
  Filled 2023-10-24: qty 15

## 2023-10-24 MED ORDER — OXYCODONE HCL 5 MG/5ML PO SOLN: 5.0000 mg | Freq: Once | ORAL | Status: DC | PRN

## 2023-10-24 MED ORDER — ONDANSETRON HCL 4 MG PO TABS: 4.0000 mg | ORAL_TABLET | Freq: Four times a day (QID) | ORAL | Status: DC | PRN

## 2023-10-24 MED ORDER — DEXAMETHASONE SODIUM PHOSPHATE 10 MG/ML IJ SOLN
INTRAMUSCULAR | Status: DC | PRN
  Administered 2023-10-24: 10 mg via INTRAVENOUS

## 2023-10-24 MED ORDER — PHENOL 1.4 % MT LIQD: 1.0000 | OROMUCOSAL | Status: DC | PRN

## 2023-10-24 MED ORDER — PROPOFOL 10 MG/ML IV BOLUS
INTRAVENOUS | Status: DC | PRN
  Administered 2023-10-24: 100 mg via INTRAVENOUS

## 2023-10-24 MED ORDER — CEFAZOLIN SODIUM-DEXTROSE 2-4 GM/100ML-% IV SOLN
INTRAVENOUS | Status: AC
Start: 2023-10-24 — End: 2023-10-24
  Filled 2023-10-24: qty 100

## 2023-10-24 MED ORDER — CEFAZOLIN SODIUM-DEXTROSE 2-4 GM/100ML-% IV SOLN
2.0000 g | INTRAVENOUS | Status: AC
  Administered 2023-10-24: 2 g via INTRAVENOUS

## 2023-10-24 MED ORDER — ENOXAPARIN SODIUM 40 MG/0.4ML IJ SOSY
40.0000 mg | PREFILLED_SYRINGE | INTRAMUSCULAR | Status: DC
  Administered 2023-10-25 – 2023-10-31 (×7): 40 mg via SUBCUTANEOUS
  Filled 2023-10-24 (×7): qty 0.4

## 2023-10-24 MED ORDER — FENTANYL CITRATE (PF) 250 MCG/5ML IJ SOLN
INTRAMUSCULAR | Status: DC | PRN
  Administered 2023-10-24 (×3): 50 ug via INTRAVENOUS
  Administered 2023-10-24: 100 ug via INTRAVENOUS

## 2023-10-24 MED ORDER — POVIDONE-IODINE 10 % EX SWAB
2.0000 | Freq: Once | CUTANEOUS | Status: AC
  Administered 2023-10-24: 2 via TOPICAL

## 2023-10-24 MED ORDER — ONDANSETRON HCL 4 MG/2ML IJ SOLN
INTRAMUSCULAR | Status: DC | PRN
  Administered 2023-10-24: 4 mg via INTRAVENOUS

## 2023-10-24 MED ORDER — PHENYLEPHRINE HCL-NACL 20-0.9 MG/250ML-% IV SOLN
INTRAVENOUS | Status: DC | PRN
  Administered 2023-10-24: 50 ug/min via INTRAVENOUS

## 2023-10-24 MED ORDER — 0.9 % SODIUM CHLORIDE (POUR BTL) OPTIME
TOPICAL | Status: DC | PRN
  Administered 2023-10-24: 100 mL

## 2023-10-24 MED ORDER — MENTHOL 3 MG MT LOZG: 1.0000 | LOZENGE | OROMUCOSAL | Status: DC | PRN

## 2023-10-24 MED ORDER — ROCURONIUM BROMIDE 10 MG/ML (PF) SYRINGE
PREFILLED_SYRINGE | INTRAVENOUS | Status: DC | PRN
  Administered 2023-10-24: 30 mg via INTRAVENOUS
  Administered 2023-10-24: 50 mg via INTRAVENOUS

## 2023-10-24 MED ORDER — LACTATED RINGERS IV SOLN: INTRAVENOUS | Status: DC

## 2023-10-24 MED ORDER — METOCLOPRAMIDE HCL 5 MG/ML IJ SOLN: 5.0000 mg | Freq: Three times a day (TID) | INTRAMUSCULAR | Status: DC | PRN

## 2023-10-24 MED ORDER — MIDAZOLAM HCL 2 MG/2ML IJ SOLN
INTRAMUSCULAR | Status: DC | PRN
  Administered 2023-10-24 (×2): 1 mg via INTRAVENOUS

## 2023-10-24 MED ORDER — FENTANYL CITRATE (PF) 100 MCG/2ML IJ SOLN
INTRAMUSCULAR | Status: AC
Start: 2023-10-24 — End: 2023-10-24
  Filled 2023-10-24: qty 2

## 2023-10-24 SURGICAL SUPPLY — 44 items
BAG COUNTER SPONGE SURGICOUNT (BAG) ×2 IMPLANT
BIT DRILL CANN LG 4.3MM (BIT) IMPLANT
BNDG COHESIVE 6X5 TAN ST LF (GAUZE/BANDAGES/DRESSINGS) IMPLANT
BRUSH SCRUB EZ PLAIN DRY (MISCELLANEOUS) ×4 IMPLANT
COVER PERINEAL POST (MISCELLANEOUS) ×2 IMPLANT
COVER SURGICAL LIGHT HANDLE (MISCELLANEOUS) ×4 IMPLANT
DRAPE C-ARM 42X72 X-RAY (DRAPES) ×2 IMPLANT
DRAPE C-ARMOR (DRAPES) ×2 IMPLANT
DRAPE HALF SHEET 40X57 (DRAPES) IMPLANT
DRAPE INCISE IOBAN 66X45 STRL (DRAPES) ×2 IMPLANT
DRAPE SURG ORHT 6 SPLT 77X108 (DRAPES) IMPLANT
DRAPE U-SHAPE 47X51 STRL (DRAPES) ×2 IMPLANT
DRESSING MEPILEX FLEX 4X4 (GAUZE/BANDAGES/DRESSINGS) ×2 IMPLANT
DRSG EMULSION OIL 3X3 NADH (GAUZE/BANDAGES/DRESSINGS) ×2 IMPLANT
DRSG MEPILEX POST OP 4X8 (GAUZE/BANDAGES/DRESSINGS) ×2 IMPLANT
ELECTRODE REM PT RTRN 9FT ADLT (ELECTROSURGICAL) ×2 IMPLANT
GLOVE BIO SURGEON STRL SZ7.5 (GLOVE) ×2 IMPLANT
GLOVE BIO SURGEON STRL SZ8 (GLOVE) ×2 IMPLANT
GLOVE BIOGEL PI IND STRL 7.5 (GLOVE) ×2 IMPLANT
GLOVE BIOGEL PI IND STRL 8 (GLOVE) ×2 IMPLANT
GLOVE SURG ORTHO LTX SZ7.5 (GLOVE) ×4 IMPLANT
GOWN STRL REUS W/ TWL LRG LVL3 (GOWN DISPOSABLE) ×4 IMPLANT
GOWN STRL REUS W/ TWL XL LVL3 (GOWN DISPOSABLE) ×2 IMPLANT
GUIDEPIN VERSANAIL DSP 3.2X444 (ORTHOPEDIC DISPOSABLE SUPPLIES) IMPLANT
GUIDEWIRE BALL NOSE 100CM (WIRE) IMPLANT
KIT BASIN OR (CUSTOM PROCEDURE TRAY) ×2 IMPLANT
KIT TURNOVER KIT B (KITS) ×2 IMPLANT
MANIFOLD NEPTUNE II (INSTRUMENTS) ×2 IMPLANT
NAIL HIP FRACT 130D 11X180 (Screw) IMPLANT
NS IRRIG 1000ML POUR BTL (IV SOLUTION) ×2 IMPLANT
PACK GENERAL/GYN (CUSTOM PROCEDURE TRAY) ×2 IMPLANT
PAD ARMBOARD POSITIONER FOAM (MISCELLANEOUS) ×4 IMPLANT
SCREW BONE CORTICAL 5.0X36 (Screw) IMPLANT
SCREW DRILL BIT ANIT ROTATION (BIT) IMPLANT
SCREW LAG HIP NAIL 10.5X95 (Screw) IMPLANT
STAPLER SKIN PROX 35W (STAPLE) ×2 IMPLANT
STOCKINETTE IMPERVIOUS LG (DRAPES) IMPLANT
SUT ETHILON 2 0 PSLX (SUTURE) ×2 IMPLANT
SUT VIC AB 0 CT1 27XBRD ANBCTR (SUTURE) ×2 IMPLANT
SUT VIC AB 1 CT1 27XBRD ANBCTR (SUTURE) ×2 IMPLANT
SUT VIC AB 2-0 CT1 TAPERPNT 27 (SUTURE) ×2 IMPLANT
TOWEL GREEN STERILE (TOWEL DISPOSABLE) ×4 IMPLANT
TOWEL GREEN STERILE FF (TOWEL DISPOSABLE) ×2 IMPLANT
WATER STERILE IRR 1000ML POUR (IV SOLUTION) ×2 IMPLANT

## 2023-10-24 NOTE — Anesthesia Postprocedure Evaluation (Signed)
 Anesthesia Post Note  Patient: Kerry Powell  Procedure(s) Performed: FIXATION, FRACTURE, INTERTROCHANTERIC, WITH INTRAMEDULLARY ROD (Left: Hip)     Patient location during evaluation: PACU Anesthesia Type: General Level of consciousness: awake and alert Pain management: pain level controlled Vital Signs Assessment: post-procedure vital signs reviewed and stable Respiratory status: spontaneous breathing, nonlabored ventilation, respiratory function stable and patient connected to nasal cannula oxygen Cardiovascular status: blood pressure returned to baseline and stable Postop Assessment: no apparent nausea or vomiting Anesthetic complications: no   No notable events documented.  Last Vitals:  Vitals:   10/24/23 1530 10/24/23 1545  BP: 133/77 133/76  Pulse: (!) 101 (!) 103  Resp: 13 17  Temp:  36.7 C  SpO2: 90% 94%    Last Pain:  Vitals:   10/24/23 1623  TempSrc:   PainSc: 9                  Tyauna Lacaze S

## 2023-10-24 NOTE — Progress Notes (Addendum)
   10/24/23 1458  TOC Brief Assessment  Insurance and Status Reviewed  Patient has primary care physician Yes  Home environment has been reviewed home  Prior level of function: self (has home 02)  Prior/Current Home Services Current home services (active w/ [facility])  Social Drivers of Health Review SDOH reviewed no interventions necessary  Readmission risk has been reviewed Yes  Transition of care needs transition of care needs identified, TOC will continue to follow     Home 02 w/ [employer]  S/p fall at home, for OR today for IM nailing of L-hip fx, will need therapy to see post op for recommendations. Inpatient Care Management (ICM) will continue to monitor patient advancement through interdisciplinary progression rounds. If new patient transition needs arise, please place a ICM (CM/CSW) consult.

## 2023-10-24 NOTE — Progress Notes (Signed)
 No changes overnight.   The risks and benefits of left hip repair were discussed with the patient and daughter, including the possibility of infection, nerve injury, vessel injury, wound breakdown, arthritis, symptomatic hardware, DVT/ PE, loss of motion, malunion, nonunion, and need for further surgery among others.  We also specifically discussed the cardiac and stroke risks.  These risks were acknowledged and consent was provided to proceed.  Ozell Bruch, MD [facility], [facility] [phone]

## 2023-10-24 NOTE — Anesthesia Procedure Notes (Signed)
 Procedure Name: Intubation Date/Time: 10/24/2023 12:32 PM  Performed by: Zelphia Norleen HERO, CRNAPre-anesthesia Checklist: Patient identified, Emergency Drugs available, Suction available and Patient being monitored Patient Re-evaluated:Patient Re-evaluated prior to induction Oxygen Delivery Method: Circle system utilized Preoxygenation: Pre-oxygenation with 100% oxygen Induction Type: IV induction Ventilation: Mask ventilation without difficulty and Oral airway inserted - appropriate to patient size Laryngoscope Size: Mac and 3 Grade View: Grade I Tube type: Oral Tube size: 7.0 mm Number of attempts: 1 Airway Equipment and Method: Stylet and Oral airway Placement Confirmation: ETT inserted through vocal cords under direct vision, positive ETCO2 and breath sounds checked- equal and bilateral Secured at: 22 cm Tube secured with: Tape Dental Injury: Teeth and Oropharynx as per pre-operative assessment

## 2023-10-24 NOTE — Anesthesia Preprocedure Evaluation (Signed)
 Anesthesia Evaluation  Patient identified by MRN, date of birth, ID band Patient awake    Reviewed: Allergy & Precautions, H&P , NPO status , Patient's Chart, lab work & pertinent test results  Airway Mallampati: II   Neck ROM: full    Dental   Pulmonary shortness of breath, COPD, former smoker   breath sounds clear to auscultation       Cardiovascular hypertension, + Peripheral Vascular Disease and +CHF   Rhythm:regular Rate:Normal     Neuro/Psych  Headaches PSYCHIATRIC DISORDERS Anxiety Depression Bipolar Disorder   TIA Neuromuscular disease CVA    GI/Hepatic PUD,GERD  ,,  Endo/Other  diabetes, Type 2    Renal/GU      Musculoskeletal  (+) Arthritis ,    Abdominal   Peds  Hematology   Anesthesia Other Findings   Reproductive/Obstetrics                              Anesthesia Physical Anesthesia Plan  ASA: 3  Anesthesia Plan: General   Post-op Pain Management:    Induction: Intravenous  PONV Risk Score and Plan: 3 and Ondansetron , Dexamethasone  and Treatment may vary due to age or medical condition  Airway Management Planned: Oral ETT  Additional Equipment:   Intra-op Plan:   Post-operative Plan: Extubation in OR  Informed Consent: I have reviewed the patients History and Physical, chart, labs and discussed the procedure including the risks, benefits and alternatives for the proposed anesthesia with the patient or authorized representative who has indicated his/her understanding and acceptance.     Dental advisory given  Plan Discussed with: CRNA, Anesthesiologist and Surgeon  Anesthesia Plan Comments:         Anesthesia Quick Evaluation

## 2023-10-24 NOTE — Progress Notes (Signed)
 Pt receiving doxycyline to right forearm pt c/o of burning in arm. This RN moved abx to other IV site in left wrist; abx was running for approximately 5 minutes when pt stated her iv was causing discomfort. RN went in to assess and noticed slight puffiness to sight.   Both Ivs removed and notified pharmacy. Pharmacy instructed to elevate and apply warm compress as needed.   IV consult placed.

## 2023-10-24 NOTE — Transfer of Care (Signed)
 Immediate Anesthesia Transfer of Care Note  Patient: Kerry Powell  Procedure(s) Performed: FIXATION, FRACTURE, INTERTROCHANTERIC, WITH INTRAMEDULLARY ROD (Left: Hip)  Patient Location: PACU  Anesthesia Type:General  Level of Consciousness: awake and alert   Airway & Oxygen Therapy: Patient Spontanous Breathing and Patient connected to nasal cannula oxygen  Post-op Assessment: Report given to RN and Post -op Vital signs reviewed and stable  Post vital signs: Reviewed and stable  Last Vitals:  Vitals Value Taken Time  BP 135/74 10/24/23 14:15  Temp 37.2 C 10/24/23 14:10  Pulse 100 10/24/23 14:26  Resp 18 10/24/23 14:26  SpO2 90 % 10/24/23 14:26  Vitals shown include unfiled device data.  Last Pain:  Vitals:   10/24/23 1051  TempSrc: Oral  PainSc: 5       Patients Stated Pain Goal: 4 (10/23/23 1211)  Complications: No notable events documented.

## 2023-10-24 NOTE — Progress Notes (Signed)
 PROGRESS NOTE    NICHELE Powell  FMW:[id number] DOB: Apr 25, 1945 DOA: 10/21/2023 PCP: Geofm Glade PARAS, MD   Brief Narrative:   78 year old female with history of T2DM CKD 3B chronic HFpEF, COPD-on home oxygen 2 L since last discharge admitted with ground-level fall. Recently admitted and discharged on August 17 after sepsis and right lower extremity cellulitis abscess treatment and was debrided by Dr. Harden.  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  Lung showed moderate centrilobular emphysema no pleural effusion or pneumothorax. Orthopedics consulted. Pre-op evaluation is done by cardiology and patient is okay to proceed with surgery.  Assessment & Plan:  Principal Problem:   Fall at home, initial encounter Active Problems:   COPD (chronic obstructive pulmonary disease) (HCC)   History of CVA (cerebrovascular accident)   GERD (gastroesophageal reflux disease)   DM2 (diabetes mellitus, type 2) (HCC)   Anxiety   Hyperlipidemia associated with type 2 diabetes mellitus (HCC)   CAD (coronary artery disease), native coronary artery   PAD (peripheral artery disease) (HCC)   Chronic diastolic CHF (congestive heart failure) (HCC)   Chronic respiratory failure with hypoxia (HCC)   Closed comminuted intertrochanteric fracture of proximal end of left femur (HCC)   Impacted angulated intertrochanteric fracture of left femur Fall at home, initial encounter Preop evaluation done: Orthopedic has been consulted for operative intervention.  Patient does have history of CVA, CHF, CKD, diabetes and is high risk.  Currently remains in significant oxygen.  CT imaging shows significant  atherosclerotic disease including coronary arteries She had CT coronary scan on 2021 and last echo in 2020-with normal LV function  Cardiology consulted for preoperative clearance. ECHO showed EF 60-65% and Grade 1 diastolic dysfunction. Lexiscan  stress test done on 8/27 showed intermediate risk. As per cardiology Given the lack of high risk stress test findings, normal ejection fraction, and planned orthopedic surgery which is not high risk from a cardiovascular perspective, would proceed with orthopedic surgery as planned Prior lexiscan  done on 03/09/2022 was unremarkable. Coronary CTA done on 07/02/19 showed coronary calcium  score of 390 and mild atherosclerosis.   COPD/emphysema Acute on chronic hypoxic resp failure: Recently discharged on supplemental oxygen. Will keep her on Pulmicort /Brovana /Yupelri  and bronchodilators as needed, I-S.  Weaned to nasal cannula at 4 L.  ABG obtained no CO2 retention pCO2 61, BNP ordered   Recent admission for LLE purulent cellulitis/abscess/sepsis: Was treated with antibiotics and debridement by Dr. Harden Cont wound care   CAD HLD PAD: holding Plavix  in anticipation for surgery CT scan showing large burden of irregular mural thrombus particularly descending thoracic aorta/coronary atherosclerosis: Plavix  on hold.  Dr. Silver from [facility] was curbsided who advised outpatient follow-up and start [facility] once able   Hypertension: BP stable, PTA on metoprolol , telmisartan .  BP soft holding meds   Chronic HFpEF: NYHA II   Imaging shows emphysema no pleural effusion or edema.  Resume home Lasix  as able   Anemia of chronic disease: Monitor hemoglobin resume home iron supplement   GERD: Continue PPI   Diabetes mellitus type 2 with hyperglycemia: continue SSI   CKD stage IIIb: B/l creatinine around 1.9-2, stable.  Avoid nephrotoxic medication monitor   Class I Obesity w/ Body mass index is 32.45 kg/m.: Will benefit with  PCP follow-up, weight loss,healthy  lifestyle and outpatient sleep eval if not done.  Disposition: Lives at home with her husband. May need SNF after hip surgery.    DVT prophylaxis: SCDs Start: 10/21/23 1412     Code Status: Full Code Family Communication: None at the bedside  Status is: Inpatient Remains inpatient appropriate because: left hip fracture    Subjective:  Complaining of severe left hip pain. She is scheduled for left hip surgery at 1 pm today.  Examination:  General exam: Appears to be in mild distress  Respiratory system: Clear to auscultation. Respiratory effort normal. Cardiovascular system: S1 & S2 heard, RRR. No JVD, murmurs, rubs, gallops or clicks. No pedal edema. Gastrointestinal system: Abdomen is nondistended, soft and nontender. No organomegaly or masses felt. Normal bowel sounds heard. Central nervous system: Alert and oriented. No focal neurological deficits. Extremities: Left leg is shortened and externally rotated. Skin: No rashes, lesions or ulcers Psychiatry: Judgement and insight appear normal. Mood & affect appropriate.      Diet Orders (From admission, onward)     Start     Ordered   10/24/23 0001  Diet NPO time specified  Diet effective midnight        10/23/23 1420            Objective: Vitals:   10/23/23 2300 10/23/23 2316 10/24/23 0323 10/24/23 0755  BP:  109/70 (!) 120/59 132/72  Pulse:  97 89 (!) 102  Resp:  20 16 16   Temp:  99.1 F (37.3 C) 97.9 F (36.6 C) 98.7 F (37.1 C)  TempSrc:  Oral Oral Oral  SpO2: 91% 92% 98% 92%  Weight:      Height:        Intake/Output Summary (Last 24 hours) at 10/24/2023 0924 Last data filed at 10/23/2023 1936 Gross per 24 hour  Intake --  Output 700 ml  Net -700 ml   Filed Weights   10/21/23 1115 10/22/23 1026  Weight: 88.5 kg 88.5 kg    Scheduled Meds:  arformoterol   15 mcg Nebulization BID   budesonide  (PULMICORT ) nebulizer solution  0.25 mg Nebulization BID   Chlorhexidine  Gluconate Cloth  6 each  Topical Daily   feeding supplement  237 mL Oral BID BM   insulin  aspart  0-15 Units Subcutaneous TID WC   montelukast   10 mg Oral QHS   multivitamin  1 tablet Oral QHS   pantoprazole   40 mg Oral BID AC   revefenacin   175 mcg Nebulization Daily   rosuvastatin   40 mg Oral Daily   Continuous Infusions:  doxycycline  (VIBRAMYCIN ) IV 125 mL/hr at 10/24/23 0206    Nutritional status Signs/Symptoms: estimated needs Interventions: Ensure Enlive (each supplement provides 350kcal and 20 grams of protein), MVI, Refer to RD note for recommendations Body mass index is 32.47 kg/m.  Data Reviewed:   CBC: Recent Labs  Lab 10/21/23 1110 10/21/23 1121 10/22/23 0503 10/23/23 0424 10/24/23 0308  WBC 15.1*  --  12.1* 13.4* 12.1*  NEUTROABS  --   --  8.8*  --   --   HGB 14.6 16.0* 13.5 12.9 13.0  HCT 45.8 47.0* 42.0 39.8 40.4  MCV 95.4  --  94.8 95.2 94.6  PLT 304  --  277 259 254   Basic Metabolic Panel: Recent Labs  Lab 10/21/23 1110 10/21/23 1121 10/22/23 0503 10/23/23 0424 10/24/23 0308  NA 137 139 134* 136 134*  K 4.2 4.3 3.7 3.9 3.9  CL 109 107 103 103 103  CO2 20*  --  23 20* 22  GLUCOSE 147* 146* 103* 124* 112*  BUN 22 25* 18 17 14   CREATININE 1.97* 2.00* 1.85* 1.72* 1.60*  CALCIUM  9.1  --  8.8* 8.4* 8.6*  MG  --   --  1.7  --   --   PHOS  --   --  3.9  --   --    GFR: Estimated Creatinine Clearance: 31.8 mL/min (A) (by C-G formula based on SCr of 1.6 mg/dL (H)). Liver Function Tests: Recent Labs  Lab 10/21/23 1110 10/22/23 0503  AST 20 18  ALT 16 14  ALKPHOS 79 76  BILITOT 0.8 0.9  PROT 7.1 6.6  ALBUMIN  3.1* 2.9*   No results for input(s): LIPASE, AMYLASE in the last 168 hours. No results for input(s): AMMONIA in the last 168 hours. Coagulation Profile: Recent Labs  Lab 10/21/23 1110  INR 1.0   Cardiac Enzymes: No results for input(s): CKTOTAL, CKMB, CKMBINDEX, TROPONINI in the last 168 hours. BNP (last 3 results) No results for  input(s): PROBNP in the last 8760 hours. HbA1C: No results for input(s): HGBA1C in the last 72 hours. CBG: Recent Labs  Lab 10/22/23 2051 10/23/23 0846 10/23/23 1644 10/23/23 2208 10/24/23 0753  GLUCAP 106* 97 111* 146* 136*   Lipid Profile: No results for input(s): CHOL, HDL, LDLCALC, TRIG, CHOLHDL, LDLDIRECT in the last 72 hours. Thyroid  Function Tests: No results for input(s): TSH, T4TOTAL, FREET4, T3FREE, THYROIDAB in the last 72 hours. Anemia Panel: No results for input(s): VITAMINB12, FOLATE, FERRITIN, TIBC, IRON, RETICCTPCT in the last 72 hours. Sepsis Labs: Recent Labs  Lab 10/21/23 1122  LATICACIDVEN 1.1    Recent Results (from the past 240 hours)  Surgical pcr screen     Status: None   Collection Time: 10/23/23  1:05 AM   Specimen: Nasal Mucosa; Nasal Swab  Result Value Ref Range Status   MRSA, PCR NEGATIVE NEGATIVE Final   Staphylococcus aureus NEGATIVE NEGATIVE Final    Comment: (NOTE) The Xpert SA Assay (FDA approved for NASAL specimens in patients 67 years of age and older), is one component of a comprehensive surveillance program. It is not intended to diagnose infection nor to guide or monitor treatment. Performed at [facility], [address], [place], [name] [address]          Radiology Studies: NM Myocar Multi W/Spect Marisela Motion / EF Result Date: 10/23/2023 CLINICAL DATA:  78 year old female with chest pain. High risk coronary artery disease. EXAM: MYOCARDIAL IMAGING WITH SPECT (REST AND PHARMACOLOGIC-STRESS) GATED LEFT VENTRICULAR WALL MOTION STUDY LEFT VENTRICULAR EJECTION FRACTION TECHNIQUE: Standard myocardial SPECT imaging was performed after resting intravenous injection of 10 mCi Tc-45m tetrofosmin . Subsequently, intravenous infusion of Lexiscan  was performed under the supervision of the Cardiology staff. At peak effect of the drug, 30 mCi Tc-52m tetrofosmin  was injected intravenously and  standard myocardial SPECT imaging was performed. Quantitative gated imaging was also performed to evaluate left ventricular wall motion, and estimate left ventricular ejection fraction. COMPARISON:  None Available. FINDINGS: Perfusion: There is motion degradation of the imaging. Moderate size region of decreased counts in the mid and basilar segment of the inferolateral wall which improves from stress to rest imaging. Wall Motion: Normal left ventricular wall motion. No left ventricular dilation. Motion degradation Left Ventricular Ejection Fraction: Greater than 70 % End diastolic volume 42 ml End systolic volume 7 ml IMPRESSION: 1. Significant motion degradation of imaging. Potential reversible ischemia within the inferolateral wall. 2.  Normal left ventricular wall motion. 3. Left ventricular ejection fraction greater than 70% 4. Non invasive risk stratification*: Intermediate *2012 Appropriate Use Criteria for Coronary Revascularization Focused Update: J Am Coll Cardiol. 2012;59(9):857-881. http://content.dementiazones.com.aspx?articleid=[email or web address] Electronically Signed   By: Jackquline Boxer M.D.   On: 10/23/2023 16:38   ECHOCARDIOGRAM COMPLETE Result Date: 10/22/2023    ECHOCARDIOGRAM REPORT   Patient Name:   Kerry Powell Date of Exam: 10/22/2023 Medical Rec #:  [id number]      Height:       65.0 in Accession #:    [id number]     Weight:       195.1 lb Date of Birth:  16-Apr-1945      BSA:          1.957 m Patient Age:    12 years       BP:           105/73 mmHg Patient Gender: F              HR:           88 bpm. Exam Location:  Inpatient Procedure: 2D Echo, Cardiac Doppler and Color Doppler (Both Spectral and Color            Flow Doppler were utilized during procedure). Indications:    Chest Pain R07.9  History:        Patient has prior history of Echocardiogram examinations, most                 recent 04/24/2018. CHF, CAD, PAD, COPD and CKD, stage 4,                 Signs/Symptoms:Shortness of Breath  and Syncope; Risk                 Factors:Hypertension, Diabetes, Dyslipidemia and Current Smoker.  Sonographer:    Thea Norlander RCS Referring Phys: [id number] TAYLOR A PARCELLS IMPRESSIONS  1. Left ventricular ejection fraction, by estimation, is 60 to 65%. The left ventricle has normal function. Left ventricular endocardial border not optimally defined to evaluate regional wall motion. There is mild concentric left ventricular hypertrophy. Left ventricular diastolic parameters are consistent with Grade I diastolic dysfunction (impaired relaxation).  2. Right ventricular systolic function is normal. The right ventricular size is normal. There is normal pulmonary artery systolic pressure. The estimated right ventricular systolic pressure is 21.8 mmHg.  3. Left atrial size was mildly dilated.  4. The mitral valve is normal in structure. No evidence of mitral valve regurgitation. No evidence of mitral stenosis.  5. The aortic valve is tricuspid. There is mild calcification of the aortic valve. Aortic valve regurgitation is not visualized. No aortic stenosis is present.  6. The inferior vena cava is normal in size with greater than 50% respiratory variability, suggesting right atrial pressure of 3 mmHg.  7. Technically difficult study with poor acoustic windows. FINDINGS  Left Ventricle: Left ventricular ejection fraction, by estimation, is 60 to 65%. The left ventricle has normal function. Left ventricular endocardial border not optimally defined to evaluate regional wall motion. The left ventricular internal cavity size was normal in size. There is mild concentric left ventricular hypertrophy. Left ventricular diastolic parameters are consistent with Grade I diastolic dysfunction (impaired relaxation). Right Ventricle: The right ventricular size is normal. Right vetricular wall thickness was not well visualized. Right ventricular systolic function is normal. There is normal pulmonary artery systolic pressure. The  tricuspid regurgitant velocity is 2.17 m/s, and with an assumed  right atrial pressure of 3 mmHg, the estimated right ventricular systolic pressure is 21.8 mmHg. Left Atrium: Left atrial size was mildly dilated. Right Atrium: Right atrial size was normal in size. Pericardium: There is no evidence of pericardial effusion. Mitral Valve: The mitral valve is normal in structure. Mild mitral annular calcification. No evidence of mitral valve regurgitation. No evidence of mitral valve stenosis. Tricuspid Valve: The tricuspid valve is normal in structure. Tricuspid valve regurgitation is trivial. Aortic Valve: The aortic valve is tricuspid. There is mild calcification of the aortic valve. Aortic valve regurgitation is not visualized. No aortic stenosis is present. Aortic valve peak gradient measures 5.9 mmHg. Pulmonic Valve: The pulmonic valve was not well visualized. Pulmonic valve regurgitation is not visualized. Aorta: The aortic root is normal in size and structure. Venous: The inferior vena cava is normal in size with greater than 50% respiratory variability, suggesting right atrial pressure of 3 mmHg. IAS/Shunts: No atrial level shunt detected by color flow Doppler.  LEFT VENTRICLE PLAX 2D LVIDd:         3.70 cm   Diastology LVIDs:         2.50 cm   LV e' medial:    12.40 cm/s LV PW:         1.40 cm   LV E/e' medial:  5.0 LV IVS:        1.10 cm   LV e' lateral:   12.60 cm/s LVOT diam:     2.10 cm   LV E/e' lateral: 4.9 LV SV:         60 LV SV Index:   30 LVOT Area:     3.46 cm  IVC IVC diam: 1.70 cm LEFT ATRIUM           Index LA diam:      4.50 cm 2.30 cm/m LA Vol (A4C): 38.9 ml 19.87 ml/m  AORTIC VALVE AV Area (Vmax): 2.82 cm AV Vmax:        121.00 cm/s AV Peak Grad:   5.9 mmHg LVOT Vmax:      98.50 cm/s LVOT Vmean:     63.400 cm/s LVOT VTI:       0.172 m  AORTA Ao Root diam: 3.50 cm Ao Asc diam:  3.60 cm MITRAL VALVE                TRICUSPID VALVE MV Area (PHT): 4.10 cm     TR Peak grad:   18.8 mmHg MV Decel  Time: 185 msec     TR Vmax:        217.00 cm/s MV E velocity: 61.80 cm/s MV A velocity: 120.00 cm/s  SHUNTS MV E/A ratio:  0.52         Systemic VTI:  0.17 m                             Systemic Diam: 2.10 cm Dalton McleanMD Electronically signed by Ezra Kanner Signature Date/Time: 10/22/2023/5:56:13 PM    Final         LOS: 3 days   Time spent= 41 mins    Deliliah Room, MD [facility]  If 7PM-7AM, please contact night-coverage  10/24/2023, 9:24 AM

## 2023-10-24 NOTE — Care Management Important Message (Signed)
 Important Message  Patient Details  Name: Kerry Powell MRN: [id number] Date of Birth: 09-Jul-1945   Important Message Given:  Yes - Medicare IM     Jon Cruel 10/24/2023, 2:54 PM

## 2023-10-25 ENCOUNTER — Encounter: Payer: Self-pay | Admitting: Internal Medicine

## 2023-10-25 ENCOUNTER — Encounter (HOSPITAL_COMMUNITY): Payer: Self-pay | Admitting: Orthopedic Surgery

## 2023-10-25 ENCOUNTER — Inpatient Hospital Stay: Admitting: Adult Health

## 2023-10-25 ENCOUNTER — Telehealth: Payer: Self-pay | Admitting: Internal Medicine

## 2023-10-25 ENCOUNTER — Other Ambulatory Visit: Payer: Self-pay

## 2023-10-25 DIAGNOSIS — W19XXXA Unspecified fall, initial encounter: Secondary | ICD-10-CM | POA: Diagnosis not present

## 2023-10-25 DIAGNOSIS — J9601 Acute respiratory failure with hypoxia: Secondary | ICD-10-CM | POA: Diagnosis not present

## 2023-10-25 DIAGNOSIS — Y92009 Unspecified place in unspecified non-institutional (private) residence as the place of occurrence of the external cause: Secondary | ICD-10-CM | POA: Diagnosis not present

## 2023-10-25 DIAGNOSIS — E1169 Type 2 diabetes mellitus with other specified complication: Secondary | ICD-10-CM

## 2023-10-25 LAB — CBC
HCT: 34.4 % — ABNORMAL LOW (ref 36.0–46.0)
Hemoglobin: 11.3 g/dL — ABNORMAL LOW (ref 12.0–15.0)
MCH: 30.5 pg (ref 26.0–34.0)
MCHC: 32.8 g/dL (ref 30.0–36.0)
MCV: 93 fL (ref 80.0–100.0)
Platelets: 272 K/uL (ref 150–400)
RBC: 3.7 MIL/uL — ABNORMAL LOW (ref 3.87–5.11)
RDW: 14.1 % (ref 11.5–15.5)
WBC: 13 K/uL — ABNORMAL HIGH (ref 4.0–10.5)
nRBC: 0 % (ref 0.0–0.2)

## 2023-10-25 LAB — BASIC METABOLIC PANEL WITH GFR
Anion gap: 12 (ref 5–15)
BUN: 17 mg/dL (ref 8–23)
CO2: 21 mmol/L — ABNORMAL LOW (ref 22–32)
Calcium: 8.8 mg/dL — ABNORMAL LOW (ref 8.9–10.3)
Chloride: 102 mmol/L (ref 98–111)
Creatinine, Ser: 1.76 mg/dL — ABNORMAL HIGH (ref 0.44–1.00)
GFR, Estimated: 29 mL/min — ABNORMAL LOW (ref >60)
Glucose, Bld: 168 mg/dL — ABNORMAL HIGH (ref 70–99)
Potassium: 4 mmol/L (ref 3.5–5.1)
Sodium: 135 mmol/L (ref 135–145)

## 2023-10-25 LAB — VITAMIN D 25 HYDROXY (VIT D DEFICIENCY, FRACTURES): Vit D, 25-Hydroxy: 47.52 ng/mL (ref 30–100)

## 2023-10-25 LAB — GLUCOSE, CAPILLARY
Glucose-Capillary: 143 mg/dL — ABNORMAL HIGH (ref 70–99)
Glucose-Capillary: 152 mg/dL — ABNORMAL HIGH (ref 70–99)
Glucose-Capillary: 156 mg/dL — ABNORMAL HIGH (ref 70–99)
Glucose-Capillary: 193 mg/dL — ABNORMAL HIGH (ref 70–99)

## 2023-10-25 MED ORDER — ARTIFICIAL TEARS OPHTHALMIC OINT: TOPICAL_OINTMENT | OPHTHALMIC | Status: DC | PRN

## 2023-10-25 MED ORDER — BLOOD GLUCOSE TEST VI STRP: 1.0000 | ORAL_STRIP | Freq: Three times a day (TID) | 0 refills | Status: DC

## 2023-10-25 MED ORDER — LANCETS MISC. MISC: 1.0000 | Freq: Three times a day (TID) | 0 refills | Status: DC

## 2023-10-25 MED ORDER — BLOOD GLUCOSE MONITORING SUPPL DEVI: 1.0000 | Freq: Three times a day (TID) | 0 refills | Status: DC

## 2023-10-25 MED ORDER — LANCET DEVICE MISC: 1.0000 | Freq: Three times a day (TID) | 0 refills | Status: DC

## 2023-10-25 NOTE — Evaluation (Signed)
 Physical Therapy Evaluation Patient Details Name: Kerry Powell MRN: [id number] DOB: January 14, 1946 Today's Date: 10/25/2023  History of Present Illness  Kerry Powell is a 78 yo female who presented 78 y.o. female presents to [facility] hospital on 10/21/2023 after fall. Pt found to have intertrochanteric fx of L femur. Pt underwent IM nailing of L femur on 8/28. PMH includes DMII, CKD, HFpEF, COPD.  Clinical Impression  Pt presents to PT with deficits in functional mobility, gait, balance, strength, endurance, power. Pt currently requires physical assistance for bed mobility and increased time for all out of bed tasks. Pt's tolerance for mobility is limited to short bouts due to pain and fatigue. PT will follow in an effort to improve activity tolerance and to reduce risk for falls. Pt is unsure if caring for her spouse and herself will be too much for her son to handle at this time. Patient will benefit from continued inpatient follow up therapy, <3 hours/day.        If plan is discharge home, recommend the following: A little help with walking and/or transfers;A lot of help with bathing/dressing/bathroom;Assistance with cooking/housework;Assist for transportation;Help with stairs or ramp for entrance   Can travel by private vehicle   Yes    Equipment Recommendations None recommended by PT  Recommendations for Other Services       Functional Status Assessment Patient has had a recent decline in their functional status and demonstrates the ability to make significant improvements in function in a reasonable and predictable amount of time.     Precautions / Restrictions Precautions Precautions: Fall Recall of Precautions/Restrictions: Intact Restrictions Weight Bearing Restrictions Per Provider Order: Yes LLE Weight Bearing Per Provider Order: Weight bearing as tolerated      Mobility  Bed Mobility Overal bed mobility: Needs Assistance Bed Mobility: Supine to Sit     Supine to sit: Mod  assist, HOB elevated, Used rails     General bed mobility comments: increased time    Transfers Overall transfer level: Needs assistance Equipment used: Rolling walker (2 wheels) Transfers: Sit to/from Stand, Bed to chair/wheelchair/BSC Sit to Stand: Contact guard assist   Step pivot transfers: Contact guard assist            Ambulation/Gait Ambulation/Gait assistance: Contact guard assist Gait Distance (Feet): 5 Feet Assistive device: Rolling walker (2 wheels) Gait Pattern/deviations: Step-to pattern Gait velocity: reduced Gait velocity interpretation: <1.31 ft/sec, indicative of household ambulator   General Gait Details: slowed step-to gait, reduced stance time on LLE  Stairs            Wheelchair Mobility     Tilt Bed    Modified Rankin (Stroke Patients Only)       Balance Overall balance assessment: Needs assistance Sitting-balance support: No upper extremity supported, Feet supported Sitting balance-Leahy Scale: Good     Standing balance support: Bilateral upper extremity supported, Reliant on assistive device for balance Standing balance-Leahy Scale: Poor                               Pertinent Vitals/Pain Pain Assessment Pain Assessment: Faces Faces Pain Scale: Hurts even more Pain Location: LLE Pain Descriptors / Indicators: Aching Pain Intervention(s): Monitored during session    Home Living Family/patient expects to be discharged to:: Private residence Living Arrangements: Spouse/significant other;Children Available Help at Discharge: Family;Available 24 hours/day Type of Home: House Home Access: Ramped entrance  Home Equipment: Grab bars - tub/shower;Shower Counsellor (2 wheels);Cane - single point;Rollator (4 wheels);Hand held shower head;Wheelchair - manual Additional Comments: Most of the DME is husbands but pt can use it if needed    Prior Function Prior Level of Function : Independent/Modified  Independent;Driving             Mobility Comments: pt reports utilizing a RW since recent admission ADLs Comments: Pt is independent but 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 At baseline she assists him with his ADL's. Son is helping with ADLs and tranportation as of recent     Extremity/Trunk Assessment   Upper Extremity Assessment Upper Extremity Assessment: Overall WFL for tasks assessed    Lower Extremity Assessment Lower Extremity Assessment: Generalized weakness;LLE deficits/detail LLE Deficits / Details: ROM WFL, grossly 3+/5 with pain limiting at hip and knee extensors    Cervical / Trunk Assessment Cervical / Trunk Assessment: Normal  Communication   Communication Communication: No apparent difficulties    Cognition Arousal: Alert Behavior During Therapy: WFL for tasks assessed/performed   PT - Cognitive impairments: No apparent impairments                         Following commands: Intact       Cueing Cueing Techniques: Verbal cues     General Comments General comments (skin integrity, edema, etc.): VSS on 3L [place] with activity    Exercises     Assessment/Plan    PT Assessment Patient needs continued PT services  PT Problem List Decreased strength;Decreased range of motion;Decreased activity tolerance;Decreased balance;Decreased mobility;Decreased coordination;Decreased cognition;Decreased knowledge of use of DME;Decreased safety awareness;Decreased knowledge of precautions;Cardiopulmonary status limiting activity       PT Treatment Interventions DME instruction;Gait training;Stair training;Functional mobility training;Therapeutic activities;Therapeutic exercise;Balance training;Neuromuscular re-education;Cognitive remediation;Patient/family education;Wheelchair mobility training    PT Goals (Current goals can be found in the Care Plan section)  Acute Rehab PT Goals Patient Stated Goal: to return to  independence PT Goal Formulation: With patient Time For Goal Achievement: 11/08/23 Potential to Achieve Goals: Good    Frequency Min 3X/week     Co-evaluation               AM-PAC PT 6 Clicks Mobility  Outcome Measure Help needed turning from your back to your side while in a flat bed without using bedrails?: A Little Help needed moving from lying on your back to sitting on the side of a flat bed without using bedrails?: A Lot Help needed moving to and from a bed to a chair (including a wheelchair)?: A Little Help needed standing up from a chair using your arms (e.g., wheelchair or bedside chair)?: A Little Help needed to walk in hospital room?: Total Help needed climbing 3-5 steps with a railing? : Total 6 Click Score: 13    End of Session Equipment Utilized During Treatment: Gait belt;Oxygen Activity Tolerance: Patient tolerated treatment well Patient left: in chair;with call bell/phone within reach;with chair alarm set Nurse Communication: Mobility status PT Visit Diagnosis: Other abnormalities of gait and mobility (R26.89)    Time: 1001-1025 PT Time Calculation (min) (ACUTE ONLY): 24 min   Charges:   PT Evaluation $PT Eval Low Complexity: 1 Low   PT General Charges $$ ACUTE PT VISIT: 1 Visit         Bernardino JINNY Ruth, PT, DPT Acute Rehabilitation Office [phone]   Bernardino JINNY Ruth 10/25/2023, 10:54 AM

## 2023-10-25 NOTE — Progress Notes (Signed)
 PROGRESS NOTE    Kerry Powell  FMW:[id number] DOB: May 24, 1945 DOA: 10/21/2023 PCP: Geofm Glade PARAS, MD   Brief Narrative:   78 year old female with history of T2DM CKD 3B chronic HFpEF, COPD-on home oxygen 2 L since last discharge admitted with ground-level fall. Recently admitted and discharged on August 17 after sepsis and right lower extremity cellulitis abscess treatment and was debrided by Dr. Harden.  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  Lung showed moderate centrilobular emphysema no pleural effusion or pneumothorax. Orthopedics consulted. Pre-op evaluation is done by cardiology and patient is okay to proceed with surgery.  Assessment & Plan:  Principal Problem:   Fall at home, initial encounter Active Problems:   COPD (chronic obstructive pulmonary disease) (HCC)   History of CVA (cerebrovascular accident)   GERD (gastroesophageal reflux disease)   DM2 (diabetes mellitus, type 2) (HCC)   Anxiety   Hyperlipidemia associated with type 2 diabetes mellitus (HCC)   CAD (coronary artery disease), native coronary artery   PAD (peripheral artery disease) (HCC)   Chronic diastolic CHF (congestive heart failure) (HCC)   Chronic respiratory failure with hypoxia (HCC)   Closed comminuted intertrochanteric fracture of proximal end of left femur (HCC)   Impacted angulated intertrochanteric fracture of left femur, s/p left hip IM surgery done on 8/28. Fall at home, initial encounter Preop evaluation done:  CT imaging shows significant atherosclerotic disease including coronary arteries She had CT coronary scan on 2021 and last echo in 2020-with normal LV  function  Cardiology consulted for preoperative clearance. ECHO showed EF 60-65% and Grade 1 diastolic dysfunction. Lexiscan  stress test done on 8/27 showed intermediate risk. As per cardiology Given the lack of high risk stress test findings, normal ejection fraction, and planned orthopedic surgery which is not high risk from a cardiovascular perspective, would proceed with orthopedic surgery as planned Prior lexiscan  done on 03/09/2022 was unremarkable. Coronary CTA done on 07/02/19 showed coronary calcium  score of 390 and mild atherosclerosis.   COPD/emphysema Acute on chronic hypoxic resp failure: Recently discharged on supplemental oxygen. Will keep her on Pulmicort /Brovana /Yupelri  and bronchodilators as needed, I-S.  Weaned to nasal cannula at 4 L.  ABG obtained no CO2 retention pCO2 61, BNP is 95.   Recent admission for LLE purulent cellulitis/abscess/sepsis: Was treated with antibiotics and debridement by Dr. Harden Cont wound care   CAD HLD PAD: holding Plavix  CT scan showing large burden of irregular mural thrombus particularly descending thoracic aorta/coronary atherosclerosis: Plavix  on hold.  Dr. Silver from [facility] was curbsided who advised outpatient follow-up and start [facility] once able   Hypertension: BP stable, PTA on metoprolol , telmisartan .  BP soft holding meds   Chronic HFpEF: NYHA II   Imaging shows emphysema no pleural effusion or edema.  Resume home Lasix  as able   Anemia of chronic disease: Monitor hemoglobin resume home iron supplement   GERD: Continue PPI   Diabetes mellitus type 2 with hyperglycemia: continue SSI   CKD stage IIIb: B/l creatinine around 1.9-2, stable.  Avoid nephrotoxic medication monitor   Class I Obesity w/ Body mass index is 32.45 kg/m.: Will benefit with PCP follow-up, weight loss,healthy lifestyle and outpatient sleep eval if not done.  Disposition: Lives at home with her husband. She will  need SNF. TOC consulted.    DVT  prophylaxis: enoxaparin  (LOVENOX ) injection 40 mg Start: 10/25/23 0800 SCDs Start: 10/24/23 1608 SCDs Start: 10/21/23 1412     Code Status: Full Code Family Communication: None at the bedside  Status is: Inpatient Remains inpatient appropriate because: s/p left hip surgery, needs SNF    Subjective:  She underwent left hip IM surgery yesterday. Pain has improved. She is agreeable to go to SNF. Her husband is on disability and has a lot of health issues. Pending PT evaluation.  Examination:  General exam: AAO x 3, nasal oxygen cannula in place Respiratory system: Clear to auscultation. Respiratory effort normal. Cardiovascular system: S1 & S2 heard, RRR. No JVD, murmurs, rubs, gallops or clicks. No pedal edema. Gastrointestinal system: Abdomen is nondistended, soft and nontender. No organomegaly or masses felt. Normal bowel sounds heard. Central nervous system: Alert and oriented. No focal neurological deficits. Extremities:s/p left hip surgery, dressing in place Skin: No rashes, lesions or ulcers Psychiatry: Judgement and insight appear normal. Mood & affect appropriate.      Diet Orders (From admission, onward)     Start     Ordered   10/24/23 1608  Diet regular Room service appropriate? Yes; Fluid consistency: Thin  Diet effective now       Question Answer Comment  Room service appropriate? Yes   Fluid consistency: Thin      10/24/23 1607            Objective: Vitals:   10/24/23 1956 10/24/23 2317 10/25/23 0300 10/25/23 0755  BP: (!) 107/58 109/72 120/67 116/77  Pulse: (!) 107 99 87 92  Resp: 16 16 18 20   Temp: 98.4 F (36.9 C) 98.7 F (37.1 C) 98.5 F (36.9 C) 98.6 F (37 C)  TempSrc: Oral Oral Oral Oral  SpO2: 94% 97% 100% 95%  Weight:      Height:        Intake/Output Summary (Last 24 hours) at 10/25/2023 0910 Last data filed at 10/25/2023 0400 Gross per 24 hour  Intake 1250 ml  Output 1625 ml  Net -375 ml   Filed Weights   10/21/23 1115  10/22/23 1026  Weight: 88.5 kg 88.5 kg    Scheduled Meds:  arformoterol   15 mcg Nebulization BID   budesonide  (PULMICORT ) nebulizer solution  0.25 mg Nebulization BID   Chlorhexidine  Gluconate Cloth  6 each Topical Daily   docusate sodium   100 mg Oral BID   enoxaparin  (LOVENOX ) injection  40 mg Subcutaneous Q24H   feeding supplement  237 mL Oral BID BM   insulin  aspart  0-15 Units Subcutaneous TID WC   montelukast   10 mg Oral QHS   multivitamin  1 tablet Oral QHS   pantoprazole   40 mg Oral BID AC   revefenacin   175 mcg Nebulization Daily   rosuvastatin   40 mg Oral Daily   Continuous Infusions:  doxycycline  (VIBRAMYCIN ) IV 100 mg (10/25/23 0910)    Nutritional status Signs/Symptoms: estimated needs Interventions: Ensure Enlive (each supplement provides 350kcal and 20 grams of protein), MVI, Refer to RD note for recommendations Body mass index is 32.47 kg/m.  Data Reviewed:   CBC: Recent Labs  Lab 10/21/23 1110 10/21/23 1121 10/22/23 0503 10/23/23 0424 10/24/23 0308 10/25/23 0414  WBC 15.1*  --  12.1* 13.4* 12.1* 13.0*  NEUTROABS  --   --  8.8*  --   --   --   HGB 14.6 16.0* 13.5 12.9 13.0 11.3*  HCT 45.8 47.0* 42.0 39.8  40.4 34.4*  MCV 95.4  --  94.8 95.2 94.6 93.0  PLT 304  --  277 259 254 272   Basic Metabolic Panel: Recent Labs  Lab 10/21/23 1110 10/21/23 1121 10/22/23 0503 10/23/23 0424 10/24/23 0308 10/25/23 0414  NA 137 139 134* 136 134* 135  K 4.2 4.3 3.7 3.9 3.9 4.0  CL 109 107 103 103 103 102  CO2 20*  --  23 20* 22 21*  GLUCOSE 147* 146* 103* 124* 112* 168*  BUN 22 25* 18 17 14 17   CREATININE 1.97* 2.00* 1.85* 1.72* 1.60* 1.76*  CALCIUM  9.1  --  8.8* 8.4* 8.6* 8.8*  MG  --   --  1.7  --   --   --   PHOS  --   --  3.9  --   --   --    GFR: Estimated Creatinine Clearance: 28.9 mL/min (A) (by C-G formula based on SCr of 1.76 mg/dL (H)). Liver Function Tests: Recent Labs  Lab 10/21/23 1110 10/22/23 0503  AST 20 18  ALT 16 14  ALKPHOS 79  76  BILITOT 0.8 0.9  PROT 7.1 6.6  ALBUMIN  3.1* 2.9*   No results for input(s): LIPASE, AMYLASE in the last 168 hours. No results for input(s): AMMONIA in the last 168 hours. Coagulation Profile: Recent Labs  Lab 10/21/23 1110  INR 1.0   Cardiac Enzymes: No results for input(s): CKTOTAL, CKMB, CKMBINDEX, TROPONINI in the last 168 hours. BNP (last 3 results) No results for input(s): PROBNP in the last 8760 hours. HbA1C: No results for input(s): HGBA1C in the last 72 hours. CBG: Recent Labs  Lab 10/24/23 1054 10/24/23 1414 10/24/23 2221 10/24/23 2327 10/25/23 0752  GLUCAP 126* 134* 300* 280* 143*   Lipid Profile: No results for input(s): CHOL, HDL, LDLCALC, TRIG, CHOLHDL, LDLDIRECT in the last 72 hours. Thyroid  Function Tests: No results for input(s): TSH, T4TOTAL, FREET4, T3FREE, THYROIDAB in the last 72 hours. Anemia Panel: No results for input(s): VITAMINB12, FOLATE, FERRITIN, TIBC, IRON, RETICCTPCT in the last 72 hours. Sepsis Labs: Recent Labs  Lab 10/21/23 1122  LATICACIDVEN 1.1    Recent Results (from the past 240 hours)  Surgical pcr screen     Status: None   Collection Time: 10/23/23  1:05 AM   Specimen: Nasal Mucosa; Nasal Swab  Result Value Ref Range Status   MRSA, PCR NEGATIVE NEGATIVE Final   Staphylococcus aureus NEGATIVE NEGATIVE Final    Comment: (NOTE) The Xpert SA Assay (FDA approved for NASAL specimens in patients 71 years of age and older), is one component of a comprehensive surveillance program. It is not intended to diagnose infection nor to guide or monitor treatment. Performed at [facility], [address] [place], [name] [address]          Radiology Studies: DG HIP UNILAT WITH PELVIS 2-3 VIEWS LEFT Result Date: 10/24/2023 CLINICAL DATA:  02020 Hip fracture (HCC) [phone]. EXAM: DG HIP (WITH OR WITHOUT PELVIS) 2-3V LEFT COMPARISON:  None Available. FINDINGS: There is  mildly displaced left femur intertrochanteric peri-implant fracture. No other acute fracture or dislocation. No aggressive osseous lesion. Visualized sacral arcuate lines are unremarkable. Unremarkable symphysis pubis. There are mild degenerative changes of bilateral hip joints characterized by joint space narrowing and osteophytosis of the superior acetabulum. No radiopaque foreign bodies. IMPRESSION: Mildly displaced left femur intertrochanteric peri-implant fracture. Electronically Signed   By: Ree Molt M.D.   On: 10/24/2023 15:40   DG FEMUR MIN 2  VIEWS LEFT Result Date: 10/24/2023 CLINICAL DATA:  Intertrochanteric fracture fixation EXAM: LEFT FEMUR 2 VIEWS COMPARISON:  CT pelvis 10/21/2023 FINDINGS: Fluoroscopic spot images depict the left hip IM nail system observed with single distal interlocking screw, traversing the previously characterized intertrochanteric fracture. No new periprosthetic fracture or acute complicating feature identified. IMPRESSION: 1. Left hip IM nail system traversing the previously characterized intertrochanteric fracture. Electronically Signed   By: Ryan Salvage M.D.   On: 10/24/2023 14:08   DG C-Arm 1-60 Min-No Report Result Date: 10/24/2023 Fluoroscopy was utilized by the requesting physician.  No radiographic interpretation.   DG C-Arm 1-60 Min-No Report Result Date: 10/24/2023 Fluoroscopy was utilized by the requesting physician.  No radiographic interpretation.   NM Myocar Multi W/Spect W/Wall Motion / EF Result Date: 10/23/2023 CLINICAL DATA:  78 year old female with chest pain. High risk coronary artery disease. EXAM: MYOCARDIAL IMAGING WITH SPECT (REST AND PHARMACOLOGIC-STRESS) GATED LEFT VENTRICULAR WALL MOTION STUDY LEFT VENTRICULAR EJECTION FRACTION TECHNIQUE: Standard myocardial SPECT imaging was performed after resting intravenous injection of 10 mCi Tc-33m tetrofosmin . Subsequently, intravenous infusion of Lexiscan  was performed under the  supervision of the Cardiology staff. At peak effect of the drug, 30 mCi Tc-78m tetrofosmin  was injected intravenously and standard myocardial SPECT imaging was performed. Quantitative gated imaging was also performed to evaluate left ventricular wall motion, and estimate left ventricular ejection fraction. COMPARISON:  None Available. FINDINGS: Perfusion: There is motion degradation of the imaging. Moderate size region of decreased counts in the mid and basilar segment of the inferolateral wall which improves from stress to rest imaging. Wall Motion: Normal left ventricular wall motion. No left ventricular dilation. Motion degradation Left Ventricular Ejection Fraction: Greater than 70 % End diastolic volume 42 ml End systolic volume 7 ml IMPRESSION: 1. Significant motion degradation of imaging. Potential reversible ischemia within the inferolateral wall. 2. Normal left ventricular wall motion. 3. Left ventricular ejection fraction greater than 70% 4. Non invasive risk stratification*: Intermediate *2012 Appropriate Use Criteria for Coronary Revascularization Focused Update: J Am Coll Cardiol. 2012;59(9):857-881. http://content.dementiazones.com.aspx?articleid=[email or web address] Electronically Signed   By: Jackquline Boxer M.D.   On: 10/23/2023 16:38        LOS: 4 days   Time spent= 41 mins    Deliliah Room, MD [facility]  If 7PM-7AM, please contact night-coverage  10/25/2023, 9:10 AM

## 2023-10-25 NOTE — Evaluation (Signed)
 Occupational Therapy Evaluation Patient Details Name: Kerry Powell MRN: [id number] DOB: 1945-12-03 Today's Date: 10/25/2023   History of Present Illness   Kerry Powell is a 78 yo female who presented 78 y.o. female presents to [facility] hospital on 10/21/2023 after fall. Pt found to have intertrochanteric fx of L femur. Pt underwent IM nailing of L femur on 8/28. PMH includes DMII, CKD, HFpEF, COPD.     Clinical Impressions Patient admitted for the diagnosis above, PTA she lives at home with her spouse, whom she cares for, and her son, who has a learning disability, but is able to assist.  Patient is currently needing up to Mod A for lower body ADL and CGA for mobility,  OT will continue efforts in the acute setting and for now, Patient will benefit from continued inpatient follow up therapy, <3 hours/day.  With a few PT/OT sessions, she may be able to go home with [facility].       If plan is discharge home, recommend the following:   Assist for transportation;Assistance with cooking/housework;A little help with walking and/or transfers;A lot of help with bathing/dressing/bathroom     Functional Status Assessment   Patient has had a recent decline in their functional status and demonstrates the ability to make significant improvements in function in a reasonable and predictable amount of time.     Equipment Recommendations   None recommended by OT     Recommendations for Other Services         Precautions/Restrictions   Precautions Precautions: Fall Recall of Precautions/Restrictions: Intact Precaution/Restrictions Comments: baseline she states she sometimes wears O2 if she is doing something that takes alot of energy (ie: making up bed) but otherwise she does not use O2 because she refuses to wear the O2 that is hooked to the big concentrator or large O2 tanks (can't manage in her house) Restrictions Weight Bearing Restrictions Per Provider Order: Yes LLE Weight Bearing Per  Provider Order: Weight bearing as tolerated     Mobility Bed Mobility Overal bed mobility: Needs Assistance Bed Mobility: Sit to Supine       Sit to supine: Min assist, Mod assist     Patient Response: Cooperative  Transfers Overall transfer level: Needs assistance Equipment used: Rolling walker (2 wheels) Transfers: Sit to/from Stand, Bed to chair/wheelchair/BSC Sit to Stand: Contact guard assist     Step pivot transfers: Contact guard assist            Balance Overall balance assessment: Needs assistance Sitting-balance support: No upper extremity supported, Feet supported Sitting balance-Leahy Scale: Good     Standing balance support: Reliant on assistive device for balance Standing balance-Leahy Scale: Poor                             ADL either performed or assessed with clinical judgement   ADL Overall ADL's : Needs assistance/impaired Eating/Feeding: Independent;Sitting   Grooming: Wash/dry hands;Wash/dry face;Set up;Sitting   Upper Body Bathing: Set up;Sitting   Lower Body Bathing: Moderate assistance;Sit to/from stand   Upper Body Dressing : Set up;Sitting   Lower Body Dressing: Moderate assistance;Sit to/from stand   Toilet Transfer: Contact guard assist;Rolling walker (2 wheels)                   Vision Patient Visual Report: No change from baseline       Perception Perception: Not tested       Praxis Praxis: Not  tested       Pertinent Vitals/Pain Pain Assessment Pain Assessment: Faces Faces Pain Scale: Hurts little more Pain Location: LLE Pain Descriptors / Indicators: Aching, Tender Pain Intervention(s): Monitored during session     Extremity/Trunk Assessment Upper Extremity Assessment Upper Extremity Assessment: Overall WFL for tasks assessed   Lower Extremity Assessment Lower Extremity Assessment: Defer to PT evaluation LLE Deficits / Details: ROM WFL, grossly 3+/5 with pain limiting at hip and knee  extensors   Cervical / Trunk Assessment Cervical / Trunk Assessment: Normal   Communication Communication Communication: No apparent difficulties   Cognition Arousal: Alert Behavior During Therapy: WFL for tasks assessed/performed Cognition: No apparent impairments                               Following commands: Intact       Cueing  General Comments   Cueing Techniques: Verbal cues  VSS on 3L [place] with activity   Exercises     Shoulder Instructions      Home Living Family/patient expects to be discharged to:: Private residence Living Arrangements: Spouse/significant other;Children Available Help at Discharge: Family;Available 24 hours/day Type of Home: House Home Access: Ramped entrance Entrance Stairs-Number of Steps: 3   Home Layout: Two level;Able to live on main level with bedroom/bathroom     Bathroom Shower/Tub: Walk-in shower;Door   [employer] Toilet: Handicapped height Bathroom Accessibility: Yes How Accessible: Accessible via walker Home Equipment: Grab bars - tub/shower;Shower Counsellor (2 wheels);Cane - single point;Rollator (4 wheels);Hand held shower head;Wheelchair - manual   Additional Comments: Most of the DME is husbands but pt can use it if needed      Prior Functioning/Environment Prior Level of Function : Independent/Modified Independent;Driving             Mobility Comments: pt reports utilizing a RW since recent admission ADLs Comments: Pt is independent but 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 At baseline she assists him with his ADL's. Son is helping with ADLs and tranportation as of recent.  Daughter can assist as well PRN    OT Problem List: Decreased strength;Decreased range of motion;Impaired balance (sitting and/or standing);Pain   OT Treatment/Interventions: Self-care/ADL training;Therapeutic activities;Balance training;DME and/or AE instruction      OT Goals(Current  goals can be found in the care plan section)   Acute Rehab OT Goals Patient Stated Goal: Return home OT Goal Formulation: With patient Time For Goal Achievement: 11/08/23 Potential to Achieve Goals: Good ADL Goals Pt Will Perform Grooming: with modified independence;standing Pt Will Perform Lower Body Dressing: with modified independence;with adaptive equipment;sit to/from stand Pt Will Transfer to Toilet: with modified independence;ambulating;regular height toilet   OT Frequency:  Min 2X/week    Co-evaluation              AM-PAC OT 6 Clicks Daily Activity     Outcome Measure Help from another person eating meals?: None Help from another person taking care of personal grooming?: None Help from another person toileting, which includes using toliet, bedpan, or urinal?: A Lot Help from another person bathing (including washing, rinsing, drying)?: A Lot Help from another person to put on and taking off regular upper body clothing?: A Little Help from another person to put on and taking off regular lower body clothing?: A Lot 6 Click Score: 17   End of Session Equipment Utilized During Treatment: Rolling walker (2 wheels)  Activity Tolerance:  Patient tolerated treatment well Patient left: in bed;with call bell/phone within reach  OT Visit Diagnosis: Unsteadiness on feet (R26.81);Pain Pain - Right/Left: Left Pain - part of body: Hip;Leg                Time: 8771-8749 OT Time Calculation (min): 22 min Charges:  OT General Charges $OT Visit: 1 Visit OT Evaluation $OT Eval Moderate Complexity: 1 Mod  10/25/2023  RP, OTR/L  Acute Rehabilitation Services  Office:  [phone]   Charlie JONETTA Halsted 10/25/2023, 1:46 PM

## 2023-10-25 NOTE — Telephone Encounter (Signed)
 VM/LM -   we would have to do a walk to see if she can tolerate the POC and it is also required from the DME [employer].    -NFN

## 2023-10-25 NOTE — Telephone Encounter (Signed)
 Copied from CRM [id number]. Topic: Clinical - Order For Equipment >> Oct 25, 2023  8:09 AM Kerry Powell wrote: Reason for CRM: Patient is currently hospitalized for a broken hip and will be transferred to a rehab facility but states she will need a portable oxygen concentrator sent to [facility] as she will be having a lot of upcoming appointments.

## 2023-10-26 DIAGNOSIS — Y92009 Unspecified place in unspecified non-institutional (private) residence as the place of occurrence of the external cause: Secondary | ICD-10-CM | POA: Diagnosis not present

## 2023-10-26 DIAGNOSIS — W19XXXA Unspecified fall, initial encounter: Secondary | ICD-10-CM | POA: Diagnosis not present

## 2023-10-26 DIAGNOSIS — J9601 Acute respiratory failure with hypoxia: Secondary | ICD-10-CM | POA: Diagnosis not present

## 2023-10-26 LAB — GLUCOSE, CAPILLARY
Glucose-Capillary: 119 mg/dL — ABNORMAL HIGH (ref 70–99)
Glucose-Capillary: 129 mg/dL — ABNORMAL HIGH (ref 70–99)
Glucose-Capillary: 119 mg/dL — ABNORMAL HIGH (ref 70–99)
Glucose-Capillary: 115 mg/dL — ABNORMAL HIGH (ref 70–99)

## 2023-10-26 LAB — CBC
HCT: 33.5 % — ABNORMAL LOW (ref 36.0–46.0)
Hemoglobin: 10.9 g/dL — ABNORMAL LOW (ref 12.0–15.0)
MCH: 30.4 pg (ref 26.0–34.0)
MCHC: 32.5 g/dL (ref 30.0–36.0)
MCV: 93.3 fL (ref 80.0–100.0)
Platelets: 295 K/uL (ref 150–400)
RBC: 3.59 MIL/uL — ABNORMAL LOW (ref 3.87–5.11)
RDW: 14.2 % (ref 11.5–15.5)
WBC: 13.4 K/uL — ABNORMAL HIGH (ref 4.0–10.5)
nRBC: 0 % (ref 0.0–0.2)

## 2023-10-26 LAB — BASIC METABOLIC PANEL WITH GFR
Anion gap: 10 (ref 5–15)
BUN: 28 mg/dL — ABNORMAL HIGH (ref 8–23)
CO2: 20 mmol/L — ABNORMAL LOW (ref 22–32)
Calcium: 8.5 mg/dL — ABNORMAL LOW (ref 8.9–10.3)
Chloride: 107 mmol/L (ref 98–111)
Creatinine, Ser: 1.57 mg/dL — ABNORMAL HIGH (ref 0.44–1.00)
GFR, Estimated: 34 mL/min — ABNORMAL LOW (ref >60)
Glucose, Bld: 187 mg/dL — ABNORMAL HIGH (ref 70–99)
Potassium: 3.3 mmol/L — ABNORMAL LOW (ref 3.5–5.1)
Sodium: 137 mmol/L (ref 135–145)

## 2023-10-26 MED ORDER — DOXYCYCLINE HYCLATE 100 MG PO TABS
100.0000 mg | ORAL_TABLET | Freq: Two times a day (BID) | ORAL | Status: DC
  Administered 2023-10-26: 100 mg via ORAL
  Filled 2023-10-26 (×2): qty 1

## 2023-10-26 MED ORDER — POTASSIUM CHLORIDE 20 MEQ PO PACK
40.0000 meq | PACK | Freq: Once | ORAL | Status: AC
  Administered 2023-10-26: 40 meq via ORAL
  Filled 2023-10-26: qty 2

## 2023-10-26 MED ORDER — OXYCODONE HCL ER 10 MG PO T12A
10.0000 mg | EXTENDED_RELEASE_TABLET | Freq: Two times a day (BID) | ORAL | Status: DC
  Administered 2023-10-26 – 2023-10-31 (×11): 10 mg via ORAL
  Filled 2023-10-26 (×11): qty 1

## 2023-10-26 MED ORDER — POLYVINYL ALCOHOL 1.4 % OP SOLN
1.0000 [drp] | OPHTHALMIC | Status: DC | PRN
  Administered 2023-10-27 – 2023-10-29 (×2): 1 [drp] via OPHTHALMIC
  Filled 2023-10-26: qty 15

## 2023-10-26 NOTE — Progress Notes (Signed)
 Pt voided the total of 300 ml. Pt's abdomen remains distended. Bladder scan performed, and a total of 409 urine scanned.In and Out Cath performed and the total of 600 ml of urine drained.

## 2023-10-26 NOTE — TOC Progression Note (Signed)
 Transition of Care [facility]) - Progression Note    Patient Details  Name: Kerry Powell MRN: [id number] Date of Birth: 21-May-1945  Transition of Care [facility]) CM/SW Contact  Robynn Eileen Hoose, RN Phone Number: 10/26/2023, 10:59 AM  Clinical Narrative:   Secure message from provider that patient does not have portable oxygen at home.  Patient uses [employer] for oxygen needs. Message to Jermaine with [employer] to assist with getting patient portable oxygen tank.    Expected Discharge Plan: Home w Home Health Services Barriers to Discharge: Continued Medical Work up               Expected Discharge Plan and Services     Post Acute Care Choice: Home Health, Resumption of Svcs/PTA Provider Living arrangements for the past 2 months: Single Family Home                                       Social Drivers of Health (SDOH) Interventions SDOH Screenings   Food Insecurity: No Food Insecurity (10/21/2023)  Housing: Low Risk  (10/21/2023)  Transportation Needs: No Transportation Needs (10/21/2023)  Utilities: Not At Risk (10/21/2023)  Alcohol  Screen: Low Risk  (05/15/2023)  Depression (PHQ2-9): Low Risk  (10/14/2023)  Recent Concern: Depression (PHQ2-9) - Medium Risk (07/24/2023)  Financial Resource Strain: Low Risk  (06/04/2023)  Physical Activity: Inactive (06/04/2023)  Social Connections: Socially Isolated (10/21/2023)  Stress: Stress Concern Present (06/04/2023)  Tobacco Use: Medium Risk (10/24/2023)  Health Literacy: Adequate Health Literacy (05/15/2023)    Readmission Risk Interventions     No data to display

## 2023-10-26 NOTE — Progress Notes (Signed)
 Occupational Therapy Treatment Patient Details Name: Kerry Powell MRN: [id number] DOB: August 02, 1945 Today's Date: 10/26/2023   History of present illness Kerry Powell is a 78 yo female who presented 78 y.o. female presents to [facility] hospital on 10/21/2023 after fall. Pt found to have intertrochanteric fx of L femur. Pt underwent IM nailing of L femur on 8/28. PMH includes DMII, CKD, HFpEF, COPD.   OT comments  Pt. Seen for skilled OT treatment session. Able to complete sit/stand with CGA.  In room ambulation with CGA. Good hand placement and RW management.  Declines need for A/E stating multiple family members available to assist prn.  Bed mobility with light CGA LLE but pt. Manages most of it on her own. Cont. With acute OT POC.       If plan is discharge home, recommend the following:  Assist for transportation;Assistance with cooking/housework;A little help with walking and/or transfers;A lot of help with bathing/dressing/bathroom   Equipment Recommendations  None recommended by OT    Recommendations for Other Services      Precautions / Restrictions Precautions Precautions: Fall Precaution/Restrictions Comments: baseline she states she sometimes wears O2 if she is doing something that takes alot of energy (ie: making up bed) but otherwise she does not use O2 because she refuses to wear the O2 that is hooked to the big concentrator or large O2 tanks (can't manage in her house) Restrictions LLE Weight Bearing Per Provider Order: Weight bearing as tolerated       Mobility Bed Mobility Overal bed mobility: Needs Assistance Bed Mobility: Sit to Supine       Sit to supine: Contact guard assist   General bed mobility comments: pt. able to sit eob and bring RLE into bed and guide LLE with hands and light support from therapist asst.  prefers not to have anyone touch her LE but her to manage speed of movement for pain prevention.  once in bed she hooked RLE under LLE and guided both into  bed without assistance.  placed LLE on pillow for elevation.    Transfers Overall transfer level: Needs assistance   Transfers: Sit to/from Stand, Bed to chair/wheelchair/BSC Sit to Stand: Contact guard assist     Step pivot transfers: Contact guard assist     General transfer comment: good hand placement.     Balance                                           ADL either performed or assessed with clinical judgement   ADL Overall ADL's : Needs assistance/impaired                       Lower Body Dressing Details (indicate cue type and reason): pt. declines need for A/E stating son, husband, and sons g.friend are available to assist prn Toilet Transfer: Contact guard assist;Rolling walker (2 wheels) Toilet Transfer Details (indicate cue type and reason): simulated during in room ambulation from recliner to bed. 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 -provided emotional support and allowed her to express her feelings                 Following commands: Intact        Cueing   Cueing Techniques: Verbal cues  Exercises      Shoulder Instructions       General Comments      Pertinent Vitals/ Pain       Pain Assessment Pain Assessment: Faces Faces Pain Scale: Hurts little more Pain Location: LLE with positional changes Pain  Descriptors / Indicators: Aching, Tender Pain Intervention(s): Limited activity within patient's tolerance, Repositioned  Home Living                                          Prior Functioning/Environment              Frequency  Min 2X/week        Progress Toward Goals  OT Goals(current goals can now be found in the care plan section)  Progress towards OT goals: Progressing toward goals     Plan      Co-evaluation                 AM-PAC OT 6 Clicks Daily Activity     Outcome Measure   Help from another person eating meals?: None Help from another person taking care of personal grooming?: None Help from another person toileting, which includes using toliet, bedpan, or urinal?: A Lot Help from another person bathing (including washing, rinsing, drying)?: A Lot Help from another person to put on and taking off regular upper body clothing?: A Little Help from another person to put on and taking off regular lower body clothing?: A Lot 6 Click Score: 17    End of Session Equipment Utilized During Treatment: Rolling walker (2 wheels)  OT Visit Diagnosis: Unsteadiness on feet (R26.81);Pain Pain - Right/Left: Left Pain - part of body: Hip;Leg   Activity Tolerance Patient tolerated treatment well   Patient Left in bed;with call bell/phone within reach   Nurse Communication Other (comment) (secure chat, pt. wanting to know when iv can be removed and her concerns regarding o2 set up and tanks for home use)        Time: 1055-1120 OT Time Calculation (min): 25 min  Charges: OT General Charges $OT Visit: 1 Visit OT Treatments $Self Care/Home Management : 23-37 mins  Randall, COTA/L Acute Rehabilitation [phone]   Kerry Powell-COTA/L  10/26/2023, 11:43 AM

## 2023-10-26 NOTE — Plan of Care (Signed)
  Problem: Health Behavior/Discharge Planning: Goal: Ability to manage health-related needs will improve 10/26/2023 0018 by Marvis Kenneth SAILOR, RN Outcome: Progressing 10/25/2023 2129 by Marvis Kenneth SAILOR, RN Outcome: Progressing   Problem: Clinical Measurements: Goal: Ability to maintain clinical measurements within normal limits will improve 10/26/2023 0018 by Marvis Kenneth SAILOR, RN Outcome: Progressing 10/25/2023 2129 by Marvis Kenneth SAILOR, RN Outcome: Progressing Goal: Will remain free from infection 10/26/2023 0018 by Marvis Kenneth SAILOR, RN Outcome: Progressing 10/25/2023 2129 by Marvis Kenneth SAILOR, RN Outcome: Progressing Goal: Diagnostic test results will improve 10/26/2023 0018 by Marvis Kenneth SAILOR, RN Outcome: Progressing 10/25/2023 2129 by Marvis Kenneth SAILOR, RN Outcome: Progressing Goal: Respiratory complications will improve 10/26/2023 0018 by Marvis Kenneth SAILOR, RN Outcome: Progressing 10/25/2023 2129 by Marvis Kenneth SAILOR, RN Outcome: Progressing Goal: Cardiovascular complication will be avoided 10/26/2023 0018 by Marvis Kenneth SAILOR, RN Outcome: Progressing 10/25/2023 2129 by Marvis Kenneth SAILOR, RN Outcome: Progressing   Problem: Activity: Goal: Risk for activity intolerance will decrease 10/26/2023 0018 by Marvis Kenneth SAILOR, RN Outcome: Progressing 10/25/2023 2129 by Marvis Kenneth SAILOR, RN Outcome: Progressing   Problem: Nutrition: Goal: Adequate nutrition will be maintained 10/26/2023 0018 by Marvis Kenneth SAILOR, RN Outcome: Progressing 10/25/2023 2129 by Marvis Kenneth SAILOR, RN Outcome: Progressing   Problem: Coping: Goal: Level of anxiety will decrease 10/26/2023 0018 by Marvis Kenneth SAILOR, RN Outcome: Progressing 10/25/2023 2129 by Marvis Kenneth SAILOR, RN Outcome: Progressing   Problem: Elimination: Goal: Will not experience complications related to bowel motility 10/26/2023 0018 by Marvis Kenneth SAILOR, RN Outcome: Progressing 10/25/2023 2129 by Marvis Kenneth SAILOR, RN Outcome: Progressing Goal: Will not experience complications related to urinary retention 10/26/2023 0018 by Marvis Kenneth SAILOR, RN Outcome: Progressing 10/25/2023 2129 by Marvis Kenneth SAILOR, RN Outcome: Progressing   Problem: Pain Managment: Goal: General experience of comfort will improve and/or be controlled 10/26/2023 0018 by Marvis Kenneth SAILOR, RN Outcome: Progressing 10/25/2023 2129 by Marvis Kenneth SAILOR, RN Outcome: Progressing   Problem: Safety: Goal: Ability to remain free from injury will improve 10/26/2023 0018 by Marvis Kenneth SAILOR, RN Outcome: Progressing 10/25/2023 2129 by Marvis Kenneth SAILOR, RN Outcome: Progressing   Problem: Skin Integrity: Goal: Risk for impaired skin integrity will decrease 10/26/2023 0018 by Marvis Kenneth SAILOR, RN Outcome: Progressing 10/25/2023 2129 by Marvis Kenneth SAILOR, RN Outcome: Progressing

## 2023-10-26 NOTE — Progress Notes (Addendum)
 PROGRESS NOTE    Kerry Powell  FMW:[id number] DOB: 06-Dec-1945 DOA: 10/21/2023 PCP: Geofm Glade PARAS, MD   Brief Narrative:   78 year old female with history of T2DM CKD 3B chronic HFpEF, COPD-on home oxygen 2 L since last discharge admitted with ground-level fall. Recently admitted and discharged on August 17 after sepsis and right lower extremity cellulitis abscess treatment and was debrided by Dr. Harden.  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  Lung showed moderate centrilobular emphysema no pleural effusion or pneumothorax. Orthopedics consulted. Pre-op evaluation done by cardiology and patient underwent left hip IM on 8/28  Assessment & Plan:  Principal Problem:   Fall at home, initial encounter Active Problems:   COPD (chronic obstructive pulmonary disease) (HCC)   History of CVA (cerebrovascular accident)   GERD (gastroesophageal reflux disease)   DM2 (diabetes mellitus, type 2) (HCC)   Anxiety   Hyperlipidemia associated with type 2 diabetes mellitus (HCC)   CAD (coronary artery disease), native coronary artery   PAD (peripheral artery disease) (HCC)   Chronic diastolic CHF (congestive heart failure) (HCC)   Chronic respiratory failure with hypoxia (HCC)   Closed comminuted intertrochanteric fracture of proximal end of left femur (HCC)   Impacted angulated intertrochanteric fracture of left femur, s/p left hip IM surgery done on 8/28. Fall at home, initial encounter Preop evaluation done:  CT imaging shows significant atherosclerotic disease including coronary arteries She had CT coronary scan on 2021 and last echo in 2020-with normal LV function  Cardiology  consulted for preoperative clearance. ECHO showed EF 60-65% and Grade 1 diastolic dysfunction. Lexiscan  stress test done on 8/27 showed intermediate risk. As per cardiology Given the lack of high risk stress test findings, normal ejection fraction, and planned orthopedic surgery which is not high risk from a cardiovascular perspective, would proceed with orthopedic surgery as planned Prior lexiscan  done on 03/09/2022 was unremarkable. Coronary CTA done on 07/02/19 showed coronary calcium  score of 390 and mild atherosclerosis.   COPD/emphysema Acute on chronic hypoxic resp failure: Recently discharged on supplemental oxygen. Will keep her on Pulmicort /Brovana /Yupelri  and bronchodilators as needed, I-S.  Weaned to nasal cannula at 2 L.  ABG obtained no CO2 retention pCO2 61, BNP is 95.   Recent admission for LLE purulent cellulitis/abscess/sepsis: Was treated with antibiotics and debridement by Dr. Harden Cont wound care   CAD HLD PAD: holding Plavix  CT scan showing large burden of irregular mural thrombus particularly descending thoracic aorta/coronary atherosclerosis: Plavix  on hold.  Dr. Silver from [facility] was curbsided who advised outpatient follow-up and start [facility] once able   Hypertension: BP stable, PTA on metoprolol , telmisartan .  BP soft holding meds   Chronic HFpEF: NYHA II   Imaging shows emphysema no pleural effusion or edema.  Resume home Lasix  as able   Anemia of chronic disease: Monitor hemoglobin resume home iron supplement   GERD: Continue PPI   Diabetes mellitus type 2 with hyperglycemia: continue SSI   CKD stage IIIb: B/l creatinine around 1.9-2, stable.  Avoid nephrotoxic medication monitor   Class I Obesity w/ Body mass index is 32.45 kg/m.: Will benefit with PCP follow-up, weight loss,healthy lifestyle and outpatient sleep eval if not done.  Urinary retention: In and out cath.  Disposition: Lives at  home with her husband. She will need SNF vs HHS. TOC  consulted.    DVT prophylaxis: enoxaparin  (LOVENOX ) injection 40 mg Start: 10/25/23 0800 SCDs Start: 10/24/23 1608 SCDs Start: 10/21/23 1412     Code Status: Full Code Family Communication: None at the bedside  Status is: Inpatient Remains inpatient appropriate because: s/p left hip surgery, needs SNF    Subjective:  She had urinary retention and in and out cath was done with drainage of 600 ml of urine. Complaining of burning sensation over right arm because of IV doxycycline . I spoke to her about changing IV doxycycline  to oral. She also mentioned about difficulty with [employer] about portable oxygen arrangement. She has tried multiple times but they wouldn't give her portable oxygen. I will talk to case management about that. She thinks that she may be able to go home but is unsure yet.  Examination:  General exam: AAO x 3, nasal oxygen cannula in place Respiratory system: Clear to auscultation. Respiratory effort normal. Cardiovascular system: S1 & S2 heard, RRR. No JVD, murmurs, rubs, gallops or clicks. No pedal edema. Gastrointestinal system: Abdomen is nondistended, soft and nontender. No organomegaly or masses felt. Normal bowel sounds heard. Central nervous system: Alert and oriented. No focal neurological deficits. Extremities:s/p left hip surgery, dressing in place Skin: No rashes, lesions or ulcers Psychiatry: Judgement and insight appear normal. Mood & affect appropriate.      Diet Orders (From admission, onward)     Start     Ordered   10/24/23 1608  Diet regular Room service appropriate? Yes; Fluid consistency: Thin  Diet effective now       Question Answer Comment  Room service appropriate? Yes   Fluid consistency: Thin      10/24/23 1607            Objective: Vitals:   10/25/23 2316 10/26/23 0453 10/26/23 0759 10/26/23 0817  BP: 115/70 118/70 123/69   Pulse: 95 90 90 92  Resp: 16 15 18 16   Temp: 97.6 F (36.4 C) 97.7 F (36.5 C) 98 F (36.7 C)    TempSrc: Oral Oral Oral   SpO2: 93% 98% 100%   Weight:      Height:        Intake/Output Summary (Last 24 hours) at 10/26/2023 0951 Last data filed at 10/26/2023 0404 Gross per 24 hour  Intake 480 ml  Output 900 ml  Net -420 ml   Filed Weights   10/21/23 1115 10/22/23 1026  Weight: 88.5 kg 88.5 kg    Scheduled Meds:  arformoterol   15 mcg Nebulization BID   budesonide  (PULMICORT ) nebulizer solution  0.25 mg Nebulization BID   Chlorhexidine  Gluconate Cloth  6 each Topical Daily   docusate sodium   100 mg Oral BID   enoxaparin  (LOVENOX ) injection  40 mg Subcutaneous Q24H   feeding supplement  237 mL Oral BID BM   insulin  aspart  0-15 Units Subcutaneous TID WC   montelukast   10 mg Oral QHS   multivitamin  1 tablet Oral QHS   pantoprazole   40 mg Oral BID AC   revefenacin   175 mcg Nebulization Daily   rosuvastatin   40 mg Oral Daily   Continuous Infusions:  doxycycline  (VIBRAMYCIN ) IV 100 mg (10/26/23 0923)    Nutritional status Signs/Symptoms: estimated needs Interventions: Ensure Enlive (each supplement provides 350kcal and 20 grams of protein), MVI, Refer to RD note for recommendations Body mass index is 32.47 kg/m.  Data Reviewed:   CBC: Recent Labs  Lab 10/22/23 0503 10/23/23 0424 10/24/23 0308 10/25/23 0414 10/26/23 0519  WBC 12.1* 13.4* 12.1* 13.0* 13.4*  NEUTROABS 8.8*  --   --   --   --   HGB 13.5 12.9 13.0 11.3* 10.9*  HCT 42.0 39.8 40.4 34.4* 33.5*  MCV 94.8 95.2 94.6 93.0 93.3  PLT 277 259 254 272 295   Basic Metabolic Panel: Recent Labs  Lab 10/22/23 0503 10/23/23 0424 10/24/23 0308 10/25/23 0414 10/26/23 0519  NA 134* 136 134* 135 137  K 3.7 3.9 3.9 4.0 3.3*  CL 103 103 103 102 107  CO2 23 20* 22 21* 20*  GLUCOSE 103* 124* 112* 168* 187*  BUN 18 17 14 17  28*  CREATININE 1.85* 1.72* 1.60* 1.76* 1.57*  CALCIUM  8.8* 8.4* 8.6* 8.8* 8.5*  MG 1.7  --   --   --   --   PHOS 3.9  --   --   --   --    GFR: Estimated Creatinine Clearance: 32.4  mL/min (A) (by C-G formula based on SCr of 1.57 mg/dL (H)). Liver Function Tests: Recent Labs  Lab 10/21/23 1110 10/22/23 0503  AST 20 18  ALT 16 14  ALKPHOS 79 76  BILITOT 0.8 0.9  PROT 7.1 6.6  ALBUMIN  3.1* 2.9*   No results for input(s): LIPASE, AMYLASE in the last 168 hours. No results for input(s): AMMONIA in the last 168 hours. Coagulation Profile: Recent Labs  Lab 10/21/23 1110  INR 1.0   Cardiac Enzymes: No results for input(s): CKTOTAL, CKMB, CKMBINDEX, TROPONINI in the last 168 hours. BNP (last 3 results) No results for input(s): PROBNP in the last 8760 hours. HbA1C: No results for input(s): HGBA1C in the last 72 hours. CBG: Recent Labs  Lab 10/25/23 0752 10/25/23 1140 10/25/23 1622 10/25/23 2106 10/26/23 0754  GLUCAP 143* 152* 156* 193* 119*   Lipid Profile: No results for input(s): CHOL, HDL, LDLCALC, TRIG, CHOLHDL, LDLDIRECT in the last 72 hours. Thyroid  Function Tests: No results for input(s): TSH, T4TOTAL, FREET4, T3FREE, THYROIDAB in the last 72 hours. Anemia Panel: No results for input(s): VITAMINB12, FOLATE, FERRITIN, TIBC, IRON, RETICCTPCT in the last 72 hours. Sepsis Labs: Recent Labs  Lab 10/21/23 1122  LATICACIDVEN 1.1    Recent Results (from the past 240 hours)  Surgical pcr screen     Status: None   Collection Time: 10/23/23  1:05 AM   Specimen: Nasal Mucosa; Nasal Swab  Result Value Ref Range Status   MRSA, PCR NEGATIVE NEGATIVE Final   Staphylococcus aureus NEGATIVE NEGATIVE Final    Comment: (NOTE) The Xpert SA Assay (FDA approved for NASAL specimens in patients 33 years of age and older), is one component of a comprehensive surveillance program. It is not intended to diagnose infection nor to guide or monitor treatment. Performed at [facility], [address] [place], [name] [address]          Radiology Studies: DG HIP UNILAT WITH PELVIS 2-3 VIEWS  LEFT Result Date: 10/24/2023 CLINICAL DATA:  02020 Hip fracture (HCC) [phone]. EXAM: DG HIP (WITH OR WITHOUT PELVIS) 2-3V LEFT COMPARISON:  None Available. FINDINGS: There is mildly displaced left femur intertrochanteric peri-implant fracture. No other acute fracture or dislocation. No aggressive osseous lesion. Visualized sacral arcuate lines are unremarkable. Unremarkable symphysis pubis. There are mild degenerative changes of bilateral hip joints characterized by joint space narrowing and osteophytosis of the superior acetabulum. No radiopaque foreign bodies. IMPRESSION: Mildly displaced left femur intertrochanteric peri-implant fracture. Electronically  Signed   By: Ree Molt M.D.   On: 10/24/2023 15:40   DG FEMUR MIN 2 VIEWS LEFT Result Date: 10/24/2023 CLINICAL DATA:  Intertrochanteric fracture fixation EXAM: LEFT FEMUR 2 VIEWS COMPARISON:  CT pelvis 10/21/2023 FINDINGS: Fluoroscopic spot images depict the left hip IM nail system observed with single distal interlocking screw, traversing the previously characterized intertrochanteric fracture. No new periprosthetic fracture or acute complicating feature identified. IMPRESSION: 1. Left hip IM nail system traversing the previously characterized intertrochanteric fracture. Electronically Signed   By: Ryan Salvage M.D.   On: 10/24/2023 14:08   DG C-Arm 1-60 Min-No Report Result Date: 10/24/2023 Fluoroscopy was utilized by the requesting physician.  No radiographic interpretation.   DG C-Arm 1-60 Min-No Report Result Date: 10/24/2023 Fluoroscopy was utilized by the requesting physician.  No radiographic interpretation.        LOS: 5 days   Time spent= 41 mins    Deliliah Room, MD [facility]  If 7PM-7AM, please contact night-coverage  10/26/2023, 9:51 AM

## 2023-10-27 DIAGNOSIS — J9601 Acute respiratory failure with hypoxia: Secondary | ICD-10-CM | POA: Diagnosis not present

## 2023-10-27 DIAGNOSIS — Y92009 Unspecified place in unspecified non-institutional (private) residence as the place of occurrence of the external cause: Secondary | ICD-10-CM | POA: Diagnosis not present

## 2023-10-27 DIAGNOSIS — W19XXXA Unspecified fall, initial encounter: Secondary | ICD-10-CM | POA: Diagnosis not present

## 2023-10-27 LAB — BASIC METABOLIC PANEL WITH GFR
Anion gap: 9 (ref 5–15)
BUN: 27 mg/dL — ABNORMAL HIGH (ref 8–23)
CO2: 22 mmol/L (ref 22–32)
Calcium: 8.5 mg/dL — ABNORMAL LOW (ref 8.9–10.3)
Chloride: 105 mmol/L (ref 98–111)
Creatinine, Ser: 1.44 mg/dL — ABNORMAL HIGH (ref 0.44–1.00)
GFR, Estimated: 37 mL/min — ABNORMAL LOW (ref >60)
Glucose, Bld: 112 mg/dL — ABNORMAL HIGH (ref 70–99)
Potassium: 4 mmol/L (ref 3.5–5.1)
Sodium: 136 mmol/L (ref 135–145)

## 2023-10-27 LAB — GLUCOSE, CAPILLARY
Glucose-Capillary: 124 mg/dL — ABNORMAL HIGH (ref 70–99)
Glucose-Capillary: 132 mg/dL — ABNORMAL HIGH (ref 70–99)
Glucose-Capillary: 90 mg/dL (ref 70–99)
Glucose-Capillary: 116 mg/dL — ABNORMAL HIGH (ref 70–99)

## 2023-10-27 LAB — CBC
HCT: 33.9 % — ABNORMAL LOW (ref 36.0–46.0)
Hemoglobin: 10.9 g/dL — ABNORMAL LOW (ref 12.0–15.0)
MCH: 30.8 pg (ref 26.0–34.0)
MCHC: 32.2 g/dL (ref 30.0–36.0)
MCV: 95.8 fL (ref 80.0–100.0)
Platelets: 289 K/uL (ref 150–400)
RBC: 3.54 MIL/uL — ABNORMAL LOW (ref 3.87–5.11)
RDW: 14.7 % (ref 11.5–15.5)
WBC: 8.7 K/uL (ref 4.0–10.5)
nRBC: 0 % (ref 0.0–0.2)

## 2023-10-27 MED ORDER — AMOXICILLIN-POT CLAVULANATE 875-125 MG PO TABS
1.0000 | ORAL_TABLET | Freq: Two times a day (BID) | ORAL | Status: DC
  Administered 2023-10-27 – 2023-10-31 (×9): 1 via ORAL
  Filled 2023-10-27 (×9): qty 1

## 2023-10-27 NOTE — Plan of Care (Signed)
 Problem: Health Behavior/Discharge Planning: Goal: Ability to manage health-related needs will improve 10/27/2023 0405 by Marvis Kenneth SAILOR, RN Outcome: Progressing 10/26/2023 2111 by Marvis Kenneth SAILOR, RN Outcome: Progressing   Problem: Clinical Measurements: Goal: Ability to maintain clinical measurements within normal limits will improve 10/27/2023 0405 by Marvis Kenneth SAILOR, RN Outcome: Progressing 10/26/2023 2111 by Marvis Kenneth SAILOR, RN Outcome: Progressing Goal: Will remain free from infection 10/27/2023 0405 by Marvis Kenneth SAILOR, RN Outcome: Progressing 10/26/2023 2111 by Marvis Kenneth SAILOR, RN Outcome: Progressing Goal: Diagnostic test results will improve 10/27/2023 0405 by Marvis Kenneth SAILOR, RN Outcome: Progressing 10/26/2023 2111 by Marvis Kenneth SAILOR, RN Outcome: Progressing Goal: Respiratory complications will improve 10/27/2023 0405 by Marvis Kenneth SAILOR, RN Outcome: Progressing 10/26/2023 2111 by Marvis Kenneth SAILOR, RN Outcome: Progressing Goal: Cardiovascular complication will be avoided 10/27/2023 0405 by Marvis Kenneth SAILOR, RN Outcome: Progressing 10/26/2023 2111 by Marvis Kenneth SAILOR, RN Outcome: Progressing   Problem: Activity: Goal: Risk for activity intolerance will decrease 10/27/2023 0405 by Marvis Kenneth SAILOR, RN Outcome: Progressing 10/26/2023 2111 by Marvis Kenneth SAILOR, RN Outcome: Progressing   Problem: Nutrition: Goal: Adequate nutrition will be maintained 10/27/2023 0405 by Marvis Kenneth SAILOR, RN Outcome: Progressing 10/26/2023 2111 by Marvis Kenneth SAILOR, RN Outcome: Progressing   Problem: Coping: Goal: Level of anxiety will decrease 10/27/2023 0405 by Marvis Kenneth SAILOR, RN Outcome: Progressing 10/26/2023 2111 by Marvis Kenneth SAILOR, RN Outcome: Progressing   Problem: Elimination: Goal: Will not experience complications related to bowel motility 10/27/2023 0405 by Marvis Kenneth SAILOR, RN Outcome: Progressing 10/26/2023 2111 by Marvis Kenneth SAILOR, RN Outcome: Progressing Goal: Will not experience complications related to urinary retention 10/27/2023 0405 by Marvis Kenneth SAILOR, RN Outcome: Progressing 10/26/2023 2111 by Marvis Kenneth SAILOR, RN Outcome: Progressing   Problem: Pain Managment: Goal: General experience of comfort will improve and/or be controlled 10/27/2023 0405 by Marvis Kenneth SAILOR, RN Outcome: Progressing 10/26/2023 2111 by Marvis Kenneth SAILOR, RN Outcome: Progressing   Problem: Safety: Goal: Ability to remain free from injury will improve 10/27/2023 0405 by Marvis Kenneth SAILOR, RN Outcome: Progressing 10/26/2023 2111 by Marvis Kenneth SAILOR, RN Outcome: Progressing   Problem: Skin Integrity: Goal: Risk for impaired skin integrity will decrease 10/27/2023 0405 by Marvis Kenneth SAILOR, RN Outcome: Progressing 10/26/2023 2111 by Marvis Kenneth SAILOR, RN Outcome: Progressing   Problem: Education: Goal: Ability to describe self-care measures that may prevent or decrease complications (Diabetes Survival Skills Education) will improve 10/27/2023 0405 by Marvis Kenneth SAILOR, RN Outcome: Progressing 10/26/2023 2111 by Marvis Kenneth SAILOR, RN Outcome: Progressing Goal: Individualized Educational Video(s) 10/27/2023 0405 by Marvis Kenneth SAILOR, RN Outcome: Progressing 10/26/2023 2111 by Marvis Kenneth SAILOR, RN Outcome: Progressing   Problem: Coping: Goal: Ability to adjust to condition or change in health will improve 10/27/2023 0405 by Marvis Kenneth SAILOR, RN Outcome: Progressing 10/26/2023 2111 by Marvis Kenneth SAILOR, RN Outcome: Progressing   Problem: Fluid Volume: Goal: Ability to maintain a balanced intake and output will improve 10/27/2023 0405 by Marvis Kenneth SAILOR, RN Outcome: Progressing 10/26/2023 2111 by Marvis Kenneth SAILOR, RN Outcome: Progressing   Problem: Health Behavior/Discharge Planning: Goal: Ability to identify and utilize available resources and services will improve 10/27/2023 0405 by Marvis Kenneth SAILOR, RN Outcome: Progressing 10/26/2023 2111 by Marvis Kenneth SAILOR, RN Outcome: Progressing Goal: Ability to manage health-related needs will improve 10/27/2023 0405 by Marvis Kenneth SAILOR, RN Outcome: Progressing 10/26/2023 2111 by Marvis Kenneth SAILOR, RN Outcome: Progressing   Problem: Metabolic: Goal: Ability to maintain appropriate  glucose levels will improve 10/27/2023 0405 by Marvis Kenneth SAILOR, RN Outcome: Progressing 10/26/2023 2111 by Marvis Kenneth SAILOR, RN Outcome: Progressing   Problem: Nutritional: Goal: Maintenance of adequate nutrition will improve 10/27/2023 0405 by Marvis Kenneth SAILOR, RN Outcome: Progressing 10/26/2023 2111 by Marvis Kenneth SAILOR, RN Outcome: Progressing Goal: Progress toward achieving an optimal weight will improve 10/27/2023 0405 by Marvis Kenneth SAILOR, RN Outcome: Progressing 10/26/2023 2111 by Marvis Kenneth SAILOR, RN Outcome: Progressing   Problem: Skin Integrity: Goal: Risk for impaired skin integrity will decrease 10/27/2023 0405 by Marvis Kenneth SAILOR, RN Outcome: Progressing 10/26/2023 2111 by Marvis Kenneth SAILOR, RN Outcome: Progressing   Problem: Tissue Perfusion: Goal: Adequacy of tissue perfusion will improve 10/27/2023 0405 by Marvis Kenneth SAILOR, RN Outcome: Progressing 10/26/2023 2111 by Marvis Kenneth SAILOR, RN Outcome: Progressing   Problem: Education: Goal: Verbalization of understanding the information provided (i.e., activity precautions, restrictions, etc) will improve 10/27/2023 0405 by Marvis Kenneth SAILOR, RN Outcome: Progressing 10/26/2023 2111 by Marvis Kenneth SAILOR, RN Outcome: Progressing Goal: Individualized Educational Video(s) 10/27/2023 0405 by Marvis Kenneth SAILOR, RN Outcome: Progressing 10/26/2023 2111 by Marvis Kenneth SAILOR, RN Outcome: Progressing   Problem: Activity: Goal: Ability to ambulate and perform ADLs will improve 10/27/2023 0405 by Marvis Kenneth SAILOR, RN Outcome: Progressing 10/26/2023 2111 by Marvis Kenneth SAILOR, RN Outcome: Progressing   Problem: Clinical Measurements: Goal: Postoperative complications will be avoided or minimized 10/27/2023 0405 by Marvis Kenneth SAILOR, RN Outcome: Progressing 10/26/2023 2111 by Marvis Kenneth SAILOR, RN Outcome: Progressing   Problem: Self-Concept: Goal: Ability to maintain and perform role responsibilities to the fullest extent possible will improve 10/27/2023 0405 by Marvis Kenneth SAILOR, RN Outcome: Progressing 10/26/2023 2111 by Marvis Kenneth SAILOR, RN Outcome: Progressing   Problem: Pain Management: Goal: Pain level will decrease 10/27/2023 0405 by Marvis Kenneth SAILOR, RN Outcome: Progressing 10/26/2023 2111 by Marvis Kenneth SAILOR, RN Outcome: Progressing

## 2023-10-27 NOTE — Progress Notes (Addendum)
 PROGRESS NOTE    Kerry Powell  FMW:[id number] DOB: 12-21-1945 DOA: 10/21/2023 PCP: Geofm Glade PARAS, MD   Brief Narrative:   78 year old female with history of T2DM CKD 3B chronic HFpEF, COPD-on home oxygen 2 L since last discharge admitted with ground-level fall. Recently admitted and discharged on August 17 after sepsis and right lower extremity cellulitis abscess treatment and was debrided by Dr. Harden.  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  Lung showed moderate centrilobular emphysema no pleural effusion or pneumothorax. Orthopedics consulted. Pre-op evaluation done by cardiology and patient underwent left hip IM on 8/28. Pending SNF placement.  Assessment & Plan:  Principal Problem:   Fall at home, initial encounter Active Problems:   COPD (chronic obstructive pulmonary disease) (HCC)   History of CVA (cerebrovascular accident)   GERD (gastroesophageal reflux disease)   DM2 (diabetes mellitus, type 2) (HCC)   Anxiety   Hyperlipidemia associated with type 2 diabetes mellitus (HCC)   CAD (coronary artery disease), native coronary artery   PAD (peripheral artery disease) (HCC)   Chronic diastolic CHF (congestive heart failure) (HCC)   Chronic respiratory failure with hypoxia (HCC)   Closed comminuted intertrochanteric fracture of proximal end of left femur (HCC)   Impacted angulated intertrochanteric fracture of left femur, s/p left hip IM surgery done on 8/28. Fall at home, initial encounter Preop evaluation done:  CT imaging shows significant atherosclerotic disease including coronary arteries She had CT coronary scan on 2021 and last echo in 2020-with normal LV  function  Cardiology consulted for preoperative clearance. ECHO showed EF 60-65% and Grade 1 diastolic dysfunction. Lexiscan  stress test done on 8/27 showed intermediate risk. As per cardiology Given the lack of high risk stress test findings, normal ejection fraction, and planned orthopedic surgery which is not high risk from a cardiovascular perspective, would proceed with orthopedic surgery as planned Prior lexiscan  done on 03/09/2022 was unremarkable. Coronary CTA done on 07/02/19 showed coronary calcium  score of 390 and mild atherosclerosis.   COPD/emphysema Acute on chronic hypoxic resp failure: Recently discharged on supplemental oxygen. Will keep her on Pulmicort /Brovana /Yupelri  and bronchodilators as needed, I-S.  Weaned to nasal cannula at 2 L.  ABG obtained no CO2 retention pCO2 61, BNP is 95.   Recent admission for LLE purulent cellulitis/abscess/sepsis: S/p incision/debridement by Dr. Harden on 8/13 and was dced on 8/17 on 3-weeks course of oral doxycycline . Cont wound care Dced doxycyline on 8/31 as patient couldn't tolerate it and started augmentin .   CAD HLD PAD: holding Plavix  CT scan showing large burden of irregular mural thrombus particularly descending thoracic aorta/coronary atherosclerosis: Plavix  on hold.  Dr. Silver from [facility] was curbsided who advised outpatient follow-up and start [facility] once able   Hypertension: BP stable, PTA on metoprolol , telmisartan .  BP soft holding meds   Chronic HFpEF: NYHA II   Imaging shows emphysema no pleural effusion or edema.  Resume home Lasix  as able   Anemia of chronic disease: Monitor hemoglobin resume home iron supplement   GERD: Continue PPI   Diabetes mellitus type 2 with hyperglycemia: continue SSI   CKD stage IIIb: B/l creatinine around 1.9-2, stable.  Avoid nephrotoxic medication monitor   Class I Obesity w/ Body mass index is 32.45 kg/m.: Will benefit  with PCP follow-up, weight loss,healthy lifestyle and  outpatient sleep eval if not done.  Urinary retention: In and out cath.  Disposition: Lives at home with her husband. She will need SNF. TOC consulted.    DVT prophylaxis: enoxaparin  (LOVENOX ) injection 40 mg Start: 10/25/23 0800 SCDs Start: 10/24/23 1608 SCDs Start: 10/21/23 1412     Code Status: Full Code Family Communication: None at the bedside  Status is: Inpatient Remains inpatient appropriate because: s/p left hip surgery, needs SNF    Subjective:  She is having gastric upset and throat burning and thinks oral doxycycline  is causing it. Pain is better controlled after initiation of long acting oxycodone  but gets worse with any exertion. She has finally decided to go to SNF/Rehab on discharge. She is concerned about her low oxygen levels. She does follow up with a pulmonologist at [facility].  Examination:  General exam: AAO x 3, nasal oxygen cannula in place Respiratory system: Clear to auscultation. Respiratory effort normal. Cardiovascular system: S1 & S2 heard, RRR. No JVD, murmurs, rubs, gallops or clicks. No pedal edema. Gastrointestinal system: Abdomen is nondistended, soft and nontender. No organomegaly or masses felt. Normal bowel sounds heard. Central nervous system: Alert and oriented. No focal neurological deficits. Extremities:s/p left hip surgery, dressing in place Skin: No rashes, lesions or ulcers Psychiatry: Judgement and insight appear normal. Mood & affect appropriate.      Diet Orders (From admission, onward)     Start     Ordered   10/24/23 1608  Diet regular Room service appropriate? Yes; Fluid consistency: Thin  Diet effective now       Question Answer Comment  Room service appropriate? Yes   Fluid consistency: Thin      10/24/23 1607            Objective: Vitals:   10/26/23 2357 10/27/23 0328 10/27/23 0755 10/27/23 0812  BP: 132/65 120/70 125/61   Pulse: 89 89 91   Resp: 20 13 11 20   Temp: 98.7 F (37.1 C) 97.9 F (36.6 C) 98.2  F (36.8 C)   TempSrc: Oral Oral Oral   SpO2: 93% 93% 98%   Weight:      Height:        Intake/Output Summary (Last 24 hours) at 10/27/2023 0916 Last data filed at 10/26/2023 1805 Gross per 24 hour  Intake --  Output 424 ml  Net -424 ml   Filed Weights   10/21/23 1115 10/22/23 1026  Weight: 88.5 kg 88.5 kg    Scheduled Meds:  arformoterol   15 mcg Nebulization BID   budesonide  (PULMICORT ) nebulizer solution  0.25 mg Nebulization BID   docusate sodium   100 mg Oral BID   enoxaparin  (LOVENOX ) injection  40 mg Subcutaneous Q24H   feeding supplement  237 mL Oral BID BM   insulin  aspart  0-15 Units Subcutaneous TID WC   montelukast   10 mg Oral QHS   multivitamin  1 tablet Oral QHS   oxyCODONE   10 mg Oral Q12H   pantoprazole   40 mg Oral BID AC   revefenacin   175 mcg Nebulization Daily   rosuvastatin   40 mg Oral Daily   Continuous Infusions:    Nutritional status Signs/Symptoms: estimated needs Interventions: Ensure Enlive (each supplement provides 350kcal and 20 grams of protein), MVI, Refer to RD note for recommendations Body mass index is 32.47 kg/m.  Data Reviewed:   CBC: Recent Labs  Lab 10/22/23 0503 10/23/23 0424 10/24/23 0308 10/25/23 0414 10/26/23 0519 10/27/23 0721  WBC  12.1* 13.4* 12.1* 13.0* 13.4* 8.7  NEUTROABS 8.8*  --   --   --   --   --   HGB 13.5 12.9 13.0 11.3* 10.9* 10.9*  HCT 42.0 39.8 40.4 34.4* 33.5* 33.9*  MCV 94.8 95.2 94.6 93.0 93.3 95.8  PLT 277 259 254 272 295 289   Basic Metabolic Panel: Recent Labs  Lab 10/22/23 0503 10/23/23 0424 10/24/23 0308 10/25/23 0414 10/26/23 0519 10/27/23 0721  NA 134* 136 134* 135 137 136  K 3.7 3.9 3.9 4.0 3.3* 4.0  CL 103 103 103 102 107 105  CO2 23 20* 22 21* 20* 22  GLUCOSE 103* 124* 112* 168* 187* 112*  BUN 18 17 14 17  28* 27*  CREATININE 1.85* 1.72* 1.60* 1.76* 1.57* 1.44*  CALCIUM  8.8* 8.4* 8.6* 8.8* 8.5* 8.5*  MG 1.7  --   --   --   --   --   PHOS 3.9  --   --   --   --   --     GFR: Estimated Creatinine Clearance: 35.4 mL/min (A) (by C-G formula based on SCr of 1.44 mg/dL (H)). Liver Function Tests: Recent Labs  Lab 10/21/23 1110 10/22/23 0503  AST 20 18  ALT 16 14  ALKPHOS 79 76  BILITOT 0.8 0.9  PROT 7.1 6.6  ALBUMIN  3.1* 2.9*   No results for input(s): LIPASE, AMYLASE in the last 168 hours. No results for input(s): AMMONIA in the last 168 hours. Coagulation Profile: Recent Labs  Lab 10/21/23 1110  INR 1.0   Cardiac Enzymes: No results for input(s): CKTOTAL, CKMB, CKMBINDEX, TROPONINI in the last 168 hours. BNP (last 3 results) No results for input(s): PROBNP in the last 8760 hours. HbA1C: No results for input(s): HGBA1C in the last 72 hours. CBG: Recent Labs  Lab 10/26/23 0754 10/26/23 1143 10/26/23 1651 10/26/23 2130 10/27/23 0822  GLUCAP 119* 129* 119* 115* 124*   Lipid Profile: No results for input(s): CHOL, HDL, LDLCALC, TRIG, CHOLHDL, LDLDIRECT in the last 72 hours. Thyroid  Function Tests: No results for input(s): TSH, T4TOTAL, FREET4, T3FREE, THYROIDAB in the last 72 hours. Anemia Panel: No results for input(s): VITAMINB12, FOLATE, FERRITIN, TIBC, IRON, RETICCTPCT in the last 72 hours. Sepsis Labs: Recent Labs  Lab 10/21/23 1122  LATICACIDVEN 1.1    Recent Results (from the past 240 hours)  Surgical pcr screen     Status: None   Collection Time: 10/23/23  1:05 AM   Specimen: Nasal Mucosa; Nasal Swab  Result Value Ref Range Status   MRSA, PCR NEGATIVE NEGATIVE Final   Staphylococcus aureus NEGATIVE NEGATIVE Final    Comment: (NOTE) The Xpert SA Assay (FDA approved for NASAL specimens in patients 49 years of age and older), is one component of a comprehensive surveillance program. It is not intended to diagnose infection nor to guide or monitor treatment. Performed at [facility], [address] [place], [name] [address]          Radiology  Studies: No results found.       LOS: 6 days   Time spent= 41 mins    Deliliah Room, MD [facility]  If 7PM-7AM, please contact night-coverage  10/27/2023, 9:16 AM

## 2023-10-28 DIAGNOSIS — J9601 Acute respiratory failure with hypoxia: Secondary | ICD-10-CM | POA: Diagnosis not present

## 2023-10-28 DIAGNOSIS — W19XXXA Unspecified fall, initial encounter: Secondary | ICD-10-CM | POA: Diagnosis not present

## 2023-10-28 DIAGNOSIS — Y92009 Unspecified place in unspecified non-institutional (private) residence as the place of occurrence of the external cause: Secondary | ICD-10-CM | POA: Diagnosis not present

## 2023-10-28 LAB — GLUCOSE, CAPILLARY
Glucose-Capillary: 141 mg/dL — ABNORMAL HIGH (ref 70–99)
Glucose-Capillary: 117 mg/dL — ABNORMAL HIGH (ref 70–99)
Glucose-Capillary: 110 mg/dL — ABNORMAL HIGH (ref 70–99)
Glucose-Capillary: 115 mg/dL — ABNORMAL HIGH (ref 70–99)

## 2023-10-28 LAB — CBC WITH DIFFERENTIAL/PLATELET
Abs Immature Granulocytes: 0.05 K/uL (ref 0.00–0.07)
Basophils Absolute: 0 K/uL (ref 0.0–0.1)
Basophils Relative: 0 %
Eosinophils Absolute: 0.4 K/uL (ref 0.0–0.5)
Eosinophils Relative: 4 %
HCT: 33.8 % — ABNORMAL LOW (ref 36.0–46.0)
Hemoglobin: 11.1 g/dL — ABNORMAL LOW (ref 12.0–15.0)
Immature Granulocytes: 1 %
Lymphocytes Relative: 17 %
Lymphs Abs: 1.5 K/uL (ref 0.7–4.0)
MCH: 30.7 pg (ref 26.0–34.0)
MCHC: 32.8 g/dL (ref 30.0–36.0)
MCV: 93.4 fL (ref 80.0–100.0)
Monocytes Absolute: 0.8 K/uL (ref 0.1–1.0)
Monocytes Relative: 9 %
Neutro Abs: 6 K/uL (ref 1.7–7.7)
Neutrophils Relative %: 69 %
Platelets: 338 K/uL (ref 150–400)
RBC: 3.62 MIL/uL — ABNORMAL LOW (ref 3.87–5.11)
RDW: 14.7 % (ref 11.5–15.5)
WBC: 8.7 K/uL (ref 4.0–10.5)
nRBC: 0 % (ref 0.0–0.2)

## 2023-10-28 LAB — BASIC METABOLIC PANEL WITH GFR
Anion gap: 13 (ref 5–15)
BUN: 23 mg/dL (ref 8–23)
CO2: 24 mmol/L (ref 22–32)
Calcium: 8.9 mg/dL (ref 8.9–10.3)
Chloride: 103 mmol/L (ref 98–111)
Creatinine, Ser: 1.42 mg/dL — ABNORMAL HIGH (ref 0.44–1.00)
GFR, Estimated: 38 mL/min — ABNORMAL LOW (ref >60)
Glucose, Bld: 140 mg/dL — ABNORMAL HIGH (ref 70–99)
Potassium: 4.3 mmol/L (ref 3.5–5.1)
Sodium: 140 mmol/L (ref 135–145)

## 2023-10-28 MED ORDER — METHOCARBAMOL 500 MG PO TABS
500.0000 mg | ORAL_TABLET | Freq: Four times a day (QID) | ORAL | Status: DC
  Administered 2023-10-28 – 2023-10-31 (×13): 500 mg via ORAL
  Filled 2023-10-28 (×13): qty 1

## 2023-10-28 MED ORDER — FUROSEMIDE 20 MG PO TABS
20.0000 mg | ORAL_TABLET | ORAL | Status: DC
  Administered 2023-10-28 – 2023-10-30 (×2): 20 mg via ORAL
  Filled 2023-10-28 (×2): qty 1

## 2023-10-28 MED ORDER — FAMOTIDINE 20 MG PO TABS
20.0000 mg | ORAL_TABLET | Freq: Every day | ORAL | Status: DC
  Administered 2023-10-28 – 2023-10-31 (×4): 20 mg via ORAL
  Filled 2023-10-28 (×4): qty 1

## 2023-10-28 MED ORDER — FERROUS SULFATE 325 (65 FE) MG PO TABS
325.0000 mg | ORAL_TABLET | ORAL | Status: DC
  Administered 2023-10-28 – 2023-10-30 (×2): 325 mg via ORAL
  Filled 2023-10-28 (×2): qty 1

## 2023-10-28 MED ORDER — ALUM & MAG HYDROXIDE-SIMETH 200-200-20 MG/5ML PO SUSP
15.0000 mL | ORAL | Status: DC | PRN
  Administered 2023-10-29: 15 mL via ORAL
  Filled 2023-10-28: qty 30

## 2023-10-28 NOTE — Progress Notes (Addendum)
 PROGRESS NOTE    Kerry Powell  FMW:[id number] DOB: 19-Feb-1946 DOA: 10/21/2023 PCP: Geofm Glade PARAS, MD   Brief Narrative:   78 year old female with history of T2DM CKD 3B chronic HFpEF, COPD-on home oxygen 2 L since last discharge admitted with ground-level fall. Recently admitted and discharged on August 17 after sepsis and right lower extremity cellulitis abscess treatment and was debrided by Dr. Harden.  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  Lung showed moderate centrilobular emphysema no pleural effusion or pneumothorax. Orthopedics consulted. Pre-op evaluation done by cardiology and patient underwent left hip IM on 8/28. Pending SNF placement.  Assessment & Plan:  Principal Problem:   Fall at home, initial encounter Active Problems:   COPD (chronic obstructive pulmonary disease) (HCC)   History of CVA (cerebrovascular accident)   GERD (gastroesophageal reflux disease)   DM2 (diabetes mellitus, type 2) (HCC)   Anxiety   Hyperlipidemia associated with type 2 diabetes mellitus (HCC)   CAD (coronary artery disease), native coronary artery   PAD (peripheral artery disease) (HCC)   Chronic diastolic CHF (congestive heart failure) (HCC)   Chronic respiratory failure with hypoxia (HCC)   Closed comminuted intertrochanteric fracture of proximal end of left femur (HCC)   Impacted angulated intertrochanteric fracture of left femur, s/p left hip IM surgery done on 8/28. Fall at home, initial encounter Preop evaluation done:  CT imaging shows significant atherosclerotic disease including coronary arteries She had CT coronary scan on 2021 and last echo in 2020-with normal LV  function  Cardiology consulted for preoperative clearance. ECHO showed EF 60-65% and Grade 1 diastolic dysfunction. Lexiscan  stress test done on 8/27 showed intermediate risk. As per cardiology Given the lack of high risk stress test findings, normal ejection fraction, and planned orthopedic surgery which is not high risk from a cardiovascular perspective, would proceed with orthopedic surgery as planned Prior lexiscan  done on 03/09/2022 was unremarkable. Coronary CTA done on 07/02/19 showed coronary calcium  score of 390 and mild atherosclerosis.   COPD/emphysema Acute on chronic hypoxic resp failure: Recently discharged on supplemental oxygen. Will keep her on Pulmicort /Brovana /Yupelri  and bronchodilators as needed, I-S.  Weaned to nasal cannula at 2 L.  ABG obtained no CO2 retention pCO2 61, BNP is 95.   Recent admission for LLE purulent cellulitis/abscess/sepsis: S/p incision/debridement by Dr. Harden on 8/13 and was dced on 8/17 on 3-weeks course of oral doxycycline . Cont wound care Dced doxycyline on 8/31 as patient couldn't tolerate it and started augmentin .   CAD HLD PAD: holding Plavix  CT scan showing large burden of irregular mural thrombus particularly descending thoracic aorta/coronary atherosclerosis: Plavix  on hold.  Dr. Silver from [facility] was curbsided who advised outpatient follow-up and start [facility] once able   Hypertension: BP stable, PTA on metoprolol , telmisartan .  BP soft holding meds   Chronic HFpEF: NYHA II. Not in exacerbation  Imaging shows emphysema but no pleural effusion or edema.  Lasix  20 mg EOD, started on 9/1. She does take  lasix  20 mg prn at home.    Anemia of chronic disease: Monitor hemoglobin, resumed home iron supplement   GERD: Continue PPI   Diabetes mellitus type 2 with hyperglycemia: continue SSI   CKD stage IIIb: B/l creatinine around 1.9-2, stable.  Avoid nephrotoxic  medication monitor   Class I Obesity w/ Body mass index is 32.45  kg/m.: Will benefit with PCP follow-up, weight loss,healthy lifestyle and outpatient sleep eval if not done.  Urinary retention: In and out cath.  Disposition: Lives at home with her husband. She will need SNF. TOC consulted.    DVT prophylaxis: enoxaparin  (LOVENOX ) injection 40 mg Start: 10/25/23 0800 SCDs Start: 10/21/23 1412     Code Status: Full Code Family Communication: None at the bedside  Status is: Inpatient Remains inpatient appropriate because: s/p left hip surgery, needs SNF    Subjective:  No acute events overnight. Pain is somewhat better but gets worse with minimal exertion. She did mention left leg intermittent spasms.  Examination:  General exam: AAO x 3, nasal oxygen cannula in place Respiratory system: Clear to auscultation. Respiratory effort normal. Cardiovascular system: S1 & S2 heard, RRR. No JVD, murmurs, rubs, gallops or clicks. No pedal edema. Gastrointestinal system: Abdomen is nondistended, soft and nontender. No organomegaly or masses felt. Normal bowel sounds heard. Central nervous system: Alert and oriented. No focal neurological deficits. Extremities:s/p left hip surgery, dressing in place Skin: No rashes, lesions or ulcers Psychiatry: Judgement and insight appear normal. Mood & affect appropriate.      Diet Orders (From admission, onward)     Start     Ordered   10/24/23 1608  Diet regular Room service appropriate? Yes; Fluid consistency: Thin  Diet effective now       Question Answer Comment  Room service appropriate? Yes   Fluid consistency: Thin      10/24/23 1607            Objective: Vitals:   10/28/23 0436 10/28/23 0746 10/28/23 0751 10/28/23 0758  BP: 110/76 113/72    Pulse:  (!) 115 97   Resp:  18 18   Temp: 97.9 F (36.6 C) 98 F (36.7 C)    TempSrc: Oral Oral    SpO2:  98% 97% 94%  Weight:      Height:        Intake/Output Summary (Last 24 hours) at 10/28/2023 0911 Last data filed at 10/28/2023 9247 Gross  per 24 hour  Intake 360 ml  Output --  Net 360 ml   Filed Weights   10/21/23 1115 10/22/23 1026  Weight: 88.5 kg 88.5 kg    Scheduled Meds:  amoxicillin -clavulanate  1 tablet Oral Q12H   arformoterol   15 mcg Nebulization BID   budesonide  (PULMICORT ) nebulizer solution  0.25 mg Nebulization BID   docusate sodium   100 mg Oral BID   enoxaparin  (LOVENOX ) injection  40 mg Subcutaneous Q24H   feeding supplement  237 mL Oral BID BM   insulin  aspart  0-15 Units Subcutaneous TID WC   montelukast   10 mg Oral QHS   multivitamin  1 tablet Oral QHS   oxyCODONE   10 mg Oral Q12H   pantoprazole   40 mg Oral BID AC   revefenacin   175 mcg Nebulization Daily   rosuvastatin   40 mg Oral Daily   Continuous Infusions:    Nutritional status Signs/Symptoms: estimated needs Interventions: Ensure Enlive (each supplement provides 350kcal and 20 grams of protein), MVI, Refer to RD note for recommendations Body mass index is 32.47 kg/m.  Data Reviewed:   CBC: Recent Labs  Lab 10/22/23 0503 10/23/23 0424 10/24/23 0308 10/25/23 0414 10/26/23 0519 10/27/23 0721 10/28/23 0648  WBC 12.1*   < > 12.1* 13.0* 13.4* 8.7 8.7  NEUTROABS 8.8*  --   --   --   --   --  6.0  HGB 13.5   < > 13.0 11.3* 10.9* 10.9* 11.1*  HCT 42.0   < > 40.4 34.4* 33.5* 33.9* 33.8*  MCV 94.8   < > 94.6 93.0 93.3 95.8 93.4  PLT 277   < > 254 272 295 289 338   < > = values in this interval not displayed.   Basic Metabolic Panel: Recent Labs  Lab 10/22/23 0503 10/23/23 0424 10/24/23 0308 10/25/23 0414 10/26/23 0519 10/27/23 0721 10/28/23 0648  NA 134*   < > 134* 135 137 136 140  K 3.7   < > 3.9 4.0 3.3* 4.0 4.3  CL 103   < > 103 102 107 105 103  CO2 23   < > 22 21* 20* 22 24  GLUCOSE 103*   < > 112* 168* 187* 112* 140*  BUN 18   < > 14 17 28* 27* 23  CREATININE 1.85*   < > 1.60* 1.76* 1.57* 1.44* 1.42*  CALCIUM  8.8*   < > 8.6* 8.8* 8.5* 8.5* 8.9  MG 1.7  --   --   --   --   --   --   PHOS 3.9  --   --   --   --    --   --    < > = values in this interval not displayed.   GFR: Estimated Creatinine Clearance: 35.9 mL/min (A) (by C-G formula based on SCr of 1.42 mg/dL (H)). Liver Function Tests: Recent Labs  Lab 10/21/23 1110 10/22/23 0503  AST 20 18  ALT 16 14  ALKPHOS 79 76  BILITOT 0.8 0.9  PROT 7.1 6.6  ALBUMIN  3.1* 2.9*   No results for input(s): LIPASE, AMYLASE in the last 168 hours. No results for input(s): AMMONIA in the last 168 hours. Coagulation Profile: Recent Labs  Lab 10/21/23 1110  INR 1.0   Cardiac Enzymes: No results for input(s): CKTOTAL, CKMB, CKMBINDEX, TROPONINI in the last 168 hours. BNP (last 3 results) No results for input(s): PROBNP in the last 8760 hours. HbA1C: No results for input(s): HGBA1C in the last 72 hours. CBG: Recent Labs  Lab 10/27/23 0822 10/27/23 1126 10/27/23 1637 10/27/23 2134 10/28/23 0806  GLUCAP 124* 132* 90 116* 141*   Lipid Profile: No results for input(s): CHOL, HDL, LDLCALC, TRIG, CHOLHDL, LDLDIRECT in the last 72 hours. Thyroid  Function Tests: No results for input(s): TSH, T4TOTAL, FREET4, T3FREE, THYROIDAB in the last 72 hours. Anemia Panel: No results for input(s): VITAMINB12, FOLATE, FERRITIN, TIBC, IRON, RETICCTPCT in the last 72 hours. Sepsis Labs: Recent Labs  Lab 10/21/23 1122  LATICACIDVEN 1.1    Recent Results (from the past 240 hours)  Surgical pcr screen     Status: None   Collection Time: 10/23/23  1:05 AM   Specimen: Nasal Mucosa; Nasal Swab  Result Value Ref Range Status   MRSA, PCR NEGATIVE NEGATIVE Final   Staphylococcus aureus NEGATIVE NEGATIVE Final    Comment: (NOTE) The Xpert SA Assay (FDA approved for NASAL specimens in patients 85 years of age and older), is one component of a comprehensive surveillance program. It is not intended to diagnose infection nor to guide or monitor treatment. Performed at [facility], [address]  [place], [name] [address]        Radiology Studies: No results found.     LOS: 7 days   Time spent= 40 mins    Deliliah Room, MD [facility]  If 7PM-7AM, please contact night-coverage  10/28/2023, 9:11 AM

## 2023-10-28 NOTE — TOC Initial Note (Signed)
 Transition of Care [facility]) - Initial/Assessment Note    Patient Details  Name: Kerry Powell MRN: [id number] Date of Birth: 01/28/1946  Transition of [facility]) CM/SW Contact:    [name] LOISE Louder, LCSW Phone Number: 10/28/2023, 3:20 PM  Clinical Narrative:                  CSW met with patient at bedside. CSW introduced self and explained role. CSW discuss with patient recommendations for short term rehab at [facility].  Patient states she lives in the home with her spouse and son. She acknowledges the need for rehab and agrees to SNF placement. CSW explained the SNF process. All questions answered.   PASRR# Pending - MD will need to sign 30 day note -in epic   TOC will provide bed offers once available  TOC will continue to follow and assist with discharge planning.    Expected Discharge Plan: Skilled Nursing Facility Barriers to Discharge: Awaiting State Approval [name]), Continued Medical Work up   Patient Goals and CMS Choice            Expected Discharge Plan and Services In-house Referral: Clinical Social Work   Post Acute Care Choice: Home Health, Resumption of Svcs/PTA Provider Living arrangements for the past 2 months: Single Family Home                                      Prior Living Arrangements/Services Living arrangements for the past 2 months: Single Family Home Lives with:: Self, Spouse, Adult Children Patient language and need for interpreter reviewed:: No        Need for Family Participation in Patient Care: Yes (Comment) Care giver support system in place?: Yes (comment)   Criminal Activity/Legal Involvement Pertinent to Current Situation/Hospitalization: No - Comment as needed  Activities of Daily Living   ADL Screening (condition at time of admission) Independently performs ADLs?: Yes (appropriate for developmental age) Is the patient deaf or have difficulty hearing?: No Does the patient have difficulty seeing, even when wearing  glasses/contacts?: No Does the patient have difficulty concentrating, remembering, or making decisions?: No  Permission Sought/Granted   Permission granted to share information with : Yes, Verbal Permission Granted  Share Information with NAME: Donnah Levert  Permission granted to share info w AGENCY: SNFs  Permission granted to share info w Relationship: daughter  Permission granted to share info w Contact Information: [phone]  Emotional Assessment Appearance:: Appears stated age Attitude/Demeanor/Rapport: Engaged Affect (typically observed): Accepting, Appropriate, Pleasant Orientation: : Oriented to Self, Oriented to Place, Oriented to  Time, Oriented to Situation Alcohol  / Substance Use: Not Applicable Psych Involvement: No (comment)  Admission diagnosis:  Closed fracture of left hip, initial encounter (HCC) [S72.002A] Fall, initial encounter [W19.XXXA] Fall at home, initial encounter [phone].CHERENE, Y92.009] Patient Active Problem List   Diagnosis Date Noted   Fall at home, initial encounter 10/21/2023   Closed comminuted intertrochanteric fracture of proximal end of left femur (HCC) 10/21/2023   Chronic respiratory failure with hypoxia (HCC) 10/16/2023   Severe sepsis (HCC) 10/07/2023   Acute hyponatremia 10/07/2023   Sterile pyuria 10/07/2023   Abscess of left lower leg 10/07/2023   Cellulitis of left lower extremity 10/06/2023   Hematoma of left lower leg 08/26/2023   Hoarseness, chronic 06/05/2023   Hypoxia 03/06/2023   Fall 02/11/2023   Leukocytosis 02/11/2023   History of COPD 02/11/2023   CKD (chronic  kidney disease) stage 4, GFR 15-29 ml/min (HCC) 02/11/2023   Chronic diastolic CHF (congestive heart failure) (HCC) 02/11/2023   Multiple fractures of ribs, bilateral, initial encounter for closed fracture 02/11/2023   Rib fractures 02/10/2023   PAD (peripheral artery disease) (HCC) 05/15/2022   Syncope and collapse 02/01/2022   Peroneal tendinitis 12/04/2021    Pain in joint of left shoulder 11/16/2021   Facial droop 07/26/2021   Osteoarthritis of knees, bilateral 05/04/2021   Pain in joint of right knee 05/04/2021   Osteoarthritis of left knee 05/03/2021   COPD with acute exacerbation (HCC) 04/18/2021   Acute hypoxic respiratory failure (HCC) 09/26/2020   B12 deficiency 08/03/2020   Diabetic polyneuropathy associated with type 2 diabetes mellitus (HCC) 07/29/2020   Diastolic dysfunction without heart failure 01/20/2020   Pain of left thumb 12/24/2019   Pain of right thumb 12/24/2019   Polyarthralgia 12/07/2019   Myalgia 12/07/2019   Anemia 12/07/2019   Fatigue 12/07/2019   Acquired thrombophilia (HCC) 07/14/2019   CAD (coronary artery disease), native coronary artery    Cervical spine pain 06/16/2019   Pleural plaque 04/27/2019   Spasm of thoracic back muscle 11/14/2018   SOB (shortness of breath) 04/17/2018   Vitamin D  deficiency 03/12/2018   Thoracic aortic aneurysm (HCC) 02/06/2018   Pain in left knee 01/14/2018   Sleep difficulties 09/09/2017   Depression 07/01/2017   Dizziness 06/10/2017   H/O renal cell carcinoma 05/23/2017   History of kidney cancer 05/23/2017   Cervical radiculopathy 10/31/2016   Hyperlipidemia associated with type 2 diabetes mellitus (HCC) 06/25/2016   Occipital neuralgia 05/09/2016   Anxiety 07/26/2015   Chronic neck pain 04/30/2015   DM2 (diabetes mellitus, type 2) (HCC) 04/27/2015   History of CVA (cerebrovascular accident) 04/26/2015   Gout 04/26/2015   Hypertension associated with type 2 diabetes mellitus (HCC) 04/26/2015   Snoring 04/26/2015   Bilateral leg edema 04/26/2015   Dysphagia 04/26/2015   GERD (gastroesophageal reflux disease) 04/26/2015   Localized edema 03/20/2015   Tobacco dependence syndrome 12/17/2007   History of colonic polyps 05/29/2007   HIATAL HERNIA 04/07/2007   Bipolar 1 disorder (HCC) 04/04/2007   RESTLESS LEG SYNDROME 04/04/2007   RHINOSINUSITIS, 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    COPD (chronic obstructive pulmonary disease) (HCC) 04/04/2007   IRRITABLE BOWEL SYNDROME 04/04/2007   Osteoarthritis, diffuse 04/04/2007   PCP:  Geofm Glade PARAS, MD Pharmacy:   [facility] [id number]), [place] - [address] [place] ([place]) [name] [address] Phone: [phone] Fax: [phone]     Social Drivers of Health (SDOH) Social History: SDOH Screenings   Food Insecurity: No Food Insecurity (10/21/2023)  Housing: Low Risk  (10/21/2023)  Transportation Needs: No Transportation Needs (10/21/2023)  Utilities: Not At Risk (10/21/2023)  Alcohol  Screen: Low Risk  (05/15/2023)  Depression (PHQ2-9): Low Risk  (10/14/2023)  Recent Concern: Depression (PHQ2-9) - Medium Risk (07/24/2023)  Financial Resource Strain: Low Risk  (06/04/2023)  Physical Activity: Inactive (06/04/2023)  Social Connections: Socially Isolated (10/21/2023)  Stress: Stress Concern Present (06/04/2023)  Tobacco Use: Medium Risk (10/24/2023)  Health Literacy: Adequate Health Literacy (05/15/2023)   SDOH Interventions:     Readmission Risk Interventions     No data to display

## 2023-10-28 NOTE — Plan of Care (Signed)
 Problem: Health Behavior/Discharge Planning: Goal: Ability to manage health-related needs will improve 10/28/2023 0027 by Kerry Kenneth SAILOR, RN Outcome: Progressing 10/27/2023 2048 by Kerry Kenneth SAILOR, RN Outcome: Progressing   Problem: Clinical Measurements: Goal: Ability to maintain clinical measurements within normal limits will improve 10/28/2023 0027 by Kerry Kenneth SAILOR, RN Outcome: Progressing 10/27/2023 2048 by Kerry Kenneth SAILOR, RN Outcome: Progressing Goal: Will remain free from infection 10/28/2023 0027 by Kerry Kenneth SAILOR, RN Outcome: Progressing 10/27/2023 2048 by Kerry Kenneth SAILOR, RN Outcome: Progressing Goal: Diagnostic test results will improve 10/28/2023 0027 by Kerry Kenneth SAILOR, RN Outcome: Progressing 10/27/2023 2048 by Kerry Kenneth SAILOR, RN Outcome: Progressing Goal: Respiratory complications will improve 10/28/2023 0027 by Kerry Kenneth SAILOR, RN Outcome: Progressing 10/27/2023 2048 by Kerry Kenneth SAILOR, RN Outcome: Progressing Goal: Cardiovascular complication will be avoided 10/28/2023 0027 by Kerry Kenneth SAILOR, RN Outcome: Progressing 10/27/2023 2048 by Kerry Kenneth SAILOR, RN Outcome: Progressing   Problem: Activity: Goal: Risk for activity intolerance will decrease 10/28/2023 0027 by Kerry Kenneth SAILOR, RN Outcome: Progressing 10/27/2023 2048 by Kerry Kenneth SAILOR, RN Outcome: Progressing   Problem: Nutrition: Goal: Adequate nutrition will be maintained 10/28/2023 0027 by Kerry Kenneth SAILOR, RN Outcome: Progressing 10/27/2023 2048 by Kerry Kenneth SAILOR, RN Outcome: Progressing   Problem: Coping: Goal: Level of anxiety will decrease 10/28/2023 0027 by Kerry Kenneth SAILOR, RN Outcome: Progressing 10/27/2023 2048 by Kerry Kenneth SAILOR, RN Outcome: Progressing   Problem: Elimination: Goal: Will not experience complications related to bowel motility 10/28/2023 0027 by Kerry Kenneth SAILOR, RN Outcome: Progressing 10/27/2023 2048 by Kerry Kenneth SAILOR,  RN Outcome: Progressing Goal: Will not experience complications related to urinary retention 10/28/2023 0027 by Kerry Kenneth SAILOR, RN Outcome: Progressing 10/27/2023 2048 by Kerry Kenneth SAILOR, RN Outcome: Progressing   Problem: Pain Managment: Goal: General experience of comfort will improve and/or be controlled 10/28/2023 0027 by Kerry Kenneth SAILOR, RN Outcome: Progressing 10/27/2023 2048 by Kerry Kenneth SAILOR, RN Outcome: Progressing   Problem: Safety: Goal: Ability to remain free from injury will improve 10/28/2023 0027 by Kerry Kenneth SAILOR, RN Outcome: Progressing 10/27/2023 2048 by Kerry Kenneth SAILOR, RN Outcome: Progressing   Problem: Skin Integrity: Goal: Risk for impaired skin integrity will decrease 10/28/2023 0027 by Kerry Kenneth SAILOR, RN Outcome: Progressing 10/27/2023 2048 by Kerry Kenneth SAILOR, RN Outcome: Progressing   Problem: Education: Goal: Ability to describe self-care measures that may prevent or decrease complications (Diabetes Survival Skills Education) will improve 10/28/2023 0027 by Kerry Kenneth SAILOR, RN Outcome: Progressing 10/27/2023 2048 by Kerry Kenneth SAILOR, RN Outcome: Progressing Goal: Individualized Educational Video(s) 10/28/2023 0027 by Kerry Kenneth SAILOR, RN Outcome: Progressing 10/27/2023 2048 by Kerry Kenneth SAILOR, RN Outcome: Progressing   Problem: Coping: Goal: Ability to adjust to condition or change in health will improve 10/28/2023 0027 by Kerry Kenneth SAILOR, RN Outcome: Progressing 10/27/2023 2048 by Kerry Kenneth SAILOR, RN Outcome: Progressing   Problem: Fluid Volume: Goal: Ability to maintain a balanced intake and output will improve 10/28/2023 0027 by Kerry Kenneth SAILOR, RN Outcome: Progressing 10/27/2023 2048 by Kerry Kenneth SAILOR, RN Outcome: Progressing   Problem: Health Behavior/Discharge Planning: Goal: Ability to identify and utilize available resources and services will improve 10/28/2023 0027 by Kerry Kenneth SAILOR, RN Outcome:  Progressing 10/27/2023 2048 by Kerry Kenneth SAILOR, RN Outcome: Progressing Goal: Ability to manage health-related needs will improve 10/28/2023 0027 by Kerry Kenneth SAILOR, RN Outcome: Progressing 10/27/2023 2048 by Kerry Kenneth SAILOR, RN Outcome: Progressing   Problem: Metabolic: Goal: Ability to maintain appropriate  glucose levels will improve 10/28/2023 0027 by Kerry Kenneth SAILOR, RN Outcome: Progressing 10/27/2023 2048 by Kerry Kenneth SAILOR, RN Outcome: Progressing   Problem: Nutritional: Goal: Maintenance of adequate nutrition will improve 10/28/2023 0027 by Kerry Kenneth SAILOR, RN Outcome: Progressing 10/27/2023 2048 by Kerry Kenneth SAILOR, RN Outcome: Progressing Goal: Progress toward achieving an optimal weight will improve 10/28/2023 0027 by Kerry Kenneth SAILOR, RN Outcome: Progressing 10/27/2023 2048 by Kerry Kenneth SAILOR, RN Outcome: Progressing   Problem: Skin Integrity: Goal: Risk for impaired skin integrity will decrease 10/28/2023 0027 by Kerry Kenneth SAILOR, RN Outcome: Progressing 10/27/2023 2048 by Kerry Kenneth SAILOR, RN Outcome: Progressing   Problem: Tissue Perfusion: Goal: Adequacy of tissue perfusion will improve 10/28/2023 0027 by Kerry Kenneth SAILOR, RN Outcome: Progressing 10/27/2023 2048 by Kerry Kenneth SAILOR, RN Outcome: Progressing   Problem: Education: Goal: Verbalization of understanding the information provided (i.e., activity precautions, restrictions, etc) will improve 10/28/2023 0027 by Kerry Kenneth SAILOR, RN Outcome: Progressing 10/27/2023 2048 by Kerry Kenneth SAILOR, RN Outcome: Progressing Goal: Individualized Educational Video(s) 10/28/2023 0027 by Kerry Kenneth SAILOR, RN Outcome: Progressing 10/27/2023 2048 by Kerry Kenneth SAILOR, RN Outcome: Progressing   Problem: Activity: Goal: Ability to ambulate and perform ADLs will improve 10/28/2023 0027 by Kerry Kenneth SAILOR, RN Outcome: Progressing 10/27/2023 2048 by Kerry Kenneth SAILOR, RN Outcome: Progressing    Problem: Clinical Measurements: Goal: Postoperative complications will be avoided or minimized 10/28/2023 0027 by Kerry Kenneth SAILOR, RN Outcome: Progressing 10/27/2023 2048 by Kerry Kenneth SAILOR, RN Outcome: Progressing   Problem: Self-Concept: Goal: Ability to maintain and perform role responsibilities to the fullest extent possible will improve 10/28/2023 0027 by Kerry Kenneth SAILOR, RN Outcome: Progressing 10/27/2023 2048 by Kerry Kenneth SAILOR, RN Outcome: Progressing   Problem: Pain Management: Goal: Pain level will decrease 10/28/2023 0027 by Kerry Kenneth SAILOR, RN Outcome: Progressing 10/27/2023 2048 by Kerry Kenneth SAILOR, RN Outcome: Progressing

## 2023-10-28 NOTE — Progress Notes (Signed)
 Physical Therapy Treatment Patient Details Name: Kerry Powell MRN: [id number] DOB: 03-Feb-1946 Today's Date: 10/28/2023   History of Present Illness Kerry Powell is a 78 yo female who presented 78 y.o. female presents to [facility] hospital on 10/21/2023 after fall. Pt found to have intertrochanteric fx of L femur. Pt underwent IM nailing of L femur on 8/28. PMH includes DMII, CKD, HFpEF, COPD.    PT Comments  Pt in bed at start of session and agreeable to therapy. Pt states that she blacks out when getting out of bed quickly and that has led to a few falls in the past towards the end of today's session. PT to check orthostasis in following sessions. Pt progressing mobility during today's session. Pt requiring less physical assist for bed mobility and ambulating short household distances using RW. Pt experienced no LOB but was limited by fatigue. Patient will benefit from continued inpatient follow up therapy, <3 hours/day to improve activity tolerance, balance, and functional mobility. PT to follow acutely.      If plan is discharge home, recommend the following: A little help with walking and/or transfers;A lot of help with bathing/dressing/bathroom;Assistance with cooking/housework;Assist for transportation;Help with stairs or ramp for entrance   Can travel by private vehicle     Yes  Equipment Recommendations       Recommendations for Other Services       Precautions / Restrictions Precautions Precautions: Fall Recall of Precautions/Restrictions: Intact Precaution/Restrictions Comments: 2L O2 at baseline Restrictions Weight Bearing Restrictions Per Provider Order: Yes LLE Weight Bearing Per Provider Order: Weight bearing as tolerated     Mobility  Bed Mobility Overal bed mobility: Needs Assistance Bed Mobility: Sit to Supine, Supine to Sit     Supine to sit: Contact guard, HOB elevated, Used rails Sit to supine: Min assist   General bed mobility comments: Pt able to bring self  to L EOB with increased time, prefers PT to not touch LLE but help with speed if needed. Needed light assist with LLE to return to bed on L side.    Transfers Overall transfer level: Needs assistance Equipment used: Rolling walker (2 wheels) Transfers: Sit to/from Stand Sit to Stand: Contact guard assist           General transfer comment: Increased time to rise and to steady    Ambulation/Gait Ambulation/Gait assistance: Contact guard assist Gait Distance (Feet): 50 Feet Assistive device: Rolling walker (2 wheels) Gait Pattern/deviations: Step-to pattern Gait velocity: decreased     General Gait Details: slowed step-to gait, reduced stance time on LLE.   Stairs             Wheelchair Mobility     Tilt Bed    Modified Rankin (Stroke Patients Only)       Balance Overall balance assessment: Needs assistance Sitting-balance support: No upper extremity supported, Feet supported Sitting balance-Leahy Scale: Good     Standing balance support: Reliant on assistive device for balance Standing balance-Leahy Scale: Poor                              Communication Communication Communication: No apparent difficulties  Cognition Arousal: Alert Behavior During Therapy: WFL for tasks assessed/performed                             Following commands: Intact      Cueing Cueing Techniques: Verbal cues  Exercises General Exercises - Lower Extremity Heel Slides: Left, 10 reps, Supine, AROM    General Comments General comments (skin integrity, edema, etc.): Pt on 5L O2 upon PT arrival. VSS on 3L O2 with activity.      Pertinent Vitals/Pain Pain Assessment Pain Assessment: Faces Faces Pain Scale: Hurts little more Pain Location: LLE Pain Descriptors / Indicators: Aching, Tender Pain Intervention(s): Monitored during session, Limited activity within patient's tolerance    Home Living                          Prior  Function            PT Goals (current goals can now be found in the care plan section) Acute Rehab PT Goals Patient Stated Goal: to return to independence PT Goal Formulation: With patient Time For Goal Achievement: 11/08/23 Potential to Achieve Goals: Good Progress towards PT goals: Progressing toward goals    Frequency    Min 3X/week      PT Plan      Co-evaluation              AM-PAC PT 6 Clicks Mobility   Outcome Measure  Help needed turning from your back to your side while in a flat bed without using bedrails?: A Little Help needed moving from lying on your back to sitting on the side of a flat bed without using bedrails?: A Little Help needed moving to and from a bed to a chair (including a wheelchair)?: A Little Help needed standing up from a chair using your arms (e.g., wheelchair or bedside chair)?: A Little Help needed to walk in hospital room?: A Little Help needed climbing 3-5 steps with a railing? : Total 6 Click Score: 16    End of Session Equipment Utilized During Treatment: Gait belt;Oxygen Activity Tolerance: Patient tolerated treatment well Patient left: in bed;with call bell/phone within reach;with bed alarm set;with nursing/sitter in room Nurse Communication: Mobility status PT Visit Diagnosis: Other abnormalities of gait and mobility (R26.89)     Time: 8951-8881 PT Time Calculation (min) (ACUTE ONLY): 30 min  Charges:    $Gait Training: 8-22 mins $Therapeutic Activity: 8-22 mins PT General Charges $$ ACUTE PT VISIT: 1 Visit                     Quintin Campi, SPT  Acute Rehab  [phone]    Quintin Campi 10/28/2023, 3:42 PM

## 2023-10-28 NOTE — Progress Notes (Signed)
 Occupational Therapy Treatment Patient Details Name: Kerry Powell MRN: [id number] DOB: 11-May-1945 Today's Date: 10/28/2023   History of present illness Kerry Powell is a 78 yo female who presented 78 y.o. female presents to [facility] hospital on 10/21/2023 after fall. Pt found to have intertrochanteric fx of L femur. Pt underwent IM nailing of L femur on 8/28. PMH includes DMII, CKD, HFpEF, COPD.   OT comments  Pt progressing toward goals, continued LLE pain. Pt needing CGA for transfers with RW and min A for bed mobility, VSS on baseline 2L O2 throughout session. Pt with concerns regarding new CHF dx and obtaining O2 for home when she returns. Pt presenting with impairments listed below, will follow acutely. Patient will benefit from continued inpatient follow up therapy, <3 hours/day to maximize safety/ind with ADL/functional mobility.       If plan is discharge home, recommend the following:  Assist for transportation;Assistance with cooking/housework;A little help with walking and/or transfers;A lot of help with bathing/dressing/bathroom   Equipment Recommendations  None recommended by OT    Recommendations for Other Services      Precautions / Restrictions Precautions Precautions: Fall Recall of Precautions/Restrictions: Intact Precaution/Restrictions Comments: 2L O2 at baseline Restrictions Weight Bearing Restrictions Per Provider Order: Yes LLE Weight Bearing Per Provider Order: Weight bearing as tolerated       Mobility Bed Mobility Overal bed mobility: Needs Assistance Bed Mobility: Sit to Supine       Sit to supine: Min assist   General bed mobility comments: assist for LLE    Transfers Overall transfer level: Needs assistance Equipment used: Rolling walker (2 wheels) Transfers: Sit to/from Stand Sit to Stand: Contact guard assist                 Balance Overall balance assessment: Needs assistance Sitting-balance support: No upper extremity supported,  Feet supported Sitting balance-Leahy Scale: Good     Standing balance support: Reliant on assistive device for balance Standing balance-Leahy Scale: Poor                             ADL either performed or assessed with clinical judgement   ADL Overall ADL's : Needs assistance/impaired                                     Functional mobility during ADLs: Contact guard assist;Rolling walker (2 wheels)      Extremity/Trunk Assessment Upper Extremity Assessment Upper Extremity Assessment: Overall WFL for tasks assessed   Lower Extremity Assessment Lower Extremity Assessment: Defer to PT evaluation        Vision       Perception Perception Perception: Not tested   Praxis Praxis Praxis: Not tested   Communication Communication Communication: No apparent difficulties   Cognition Arousal: Alert Behavior During Therapy: WFL for tasks assessed/performed Cognition: No apparent impairments             OT - Cognition Comments: anixous/fearful of falling                 Following commands: Intact        Cueing   Cueing Techniques: Verbal cues  Exercises      Shoulder Instructions       General Comments VSS on 2L O2    Pertinent Vitals/ Pain       Pain Assessment Pain  Assessment: Faces Pain Score: 4  Faces Pain Scale: Hurts little more Pain Location: LLE Pain Descriptors / Indicators: Aching, Tender Pain Intervention(s): Limited activity within patient's tolerance, Monitored during session, Repositioned  Home Living                                          Prior Functioning/Environment              Frequency  Min 2X/week        Progress Toward Goals  OT Goals(current goals can now be found in the care plan section)  Progress towards OT goals: Progressing toward goals  Acute Rehab OT Goals Patient Stated Goal: none stated OT Goal Formulation: With patient Time For Goal Achievement:  11/08/23 Potential to Achieve Goals: Good ADL Goals Pt Will Perform Grooming: with modified independence;standing Pt Will Perform Lower Body Dressing: with modified independence;with adaptive equipment;sit to/from stand Pt Will Transfer to Toilet: with modified independence;ambulating;regular height toilet  Plan      Co-evaluation                 AM-PAC OT 6 Clicks Daily Activity     Outcome Measure   Help from another person eating meals?: None Help from another person taking care of personal grooming?: None Help from another person toileting, which includes using toliet, bedpan, or urinal?: A Lot Help from another person bathing (including washing, rinsing, drying)?: A Lot Help from another person to put on and taking off regular upper body clothing?: A Little Help from another person to put on and taking off regular lower body clothing?: A Lot 6 Click Score: 17    End of Session Equipment Utilized During Treatment: Rolling walker (2 wheels)  OT Visit Diagnosis: Unsteadiness on feet (R26.81);Pain Pain - Right/Left: Left Pain - part of body: Hip;Leg   Activity Tolerance Patient tolerated treatment well   Patient Left in bed;with call bell/phone within reach;with bed alarm set   Nurse Communication Mobility status        Time: 8476-8451 OT Time Calculation (min): 25 min  Charges: OT General Charges $OT Visit: 1 Visit OT Treatments $Therapeutic Activity: 23-37 mins  Joell Usman K, OTD, OTR/L [employer] Preferred Acute Rehab [phone]   Nela Bascom K Koonce 10/28/2023, 3:52 PM

## 2023-10-28 NOTE — NC FL2 (Signed)
 [facility]  MEDICAID FL2 LEVEL OF CARE FORM     IDENTIFICATION  Patient Name: Kerry Powell Birthdate: 1946/01/11 Sex: female Admission Date (Current Location): 10/21/2023  [facility] and [place] Number:  Producer, television/film/video and Address:  [facility], [address], [place], [name] [address]      Provider Number: [id number]  Attending Physician Name and Address:  Dino Antu, MD  Relative Name and Phone Number:  Beda, Dula (Daughter)  [phone] (Mobile)    Current Level of Care: Hospital Recommended Level of Care: Skilled Nursing Facility Prior Approval Number:    Date Approved/Denied:   PASRR Number: Pending  Discharge Plan: SNF    Current Diagnoses: Patient Active Problem List   Diagnosis Date Noted   Fall at home, initial encounter 10/21/2023   Closed comminuted intertrochanteric fracture of proximal end of left femur (HCC) 10/21/2023   Chronic respiratory failure with hypoxia (HCC) 10/16/2023   Severe sepsis (HCC) 10/07/2023   Acute hyponatremia 10/07/2023   Sterile pyuria 10/07/2023   Abscess of left lower leg 10/07/2023   Cellulitis of left lower extremity 10/06/2023   Hematoma of left lower leg 08/26/2023   Hoarseness, chronic 06/05/2023   Hypoxia 03/06/2023   Fall 02/11/2023   Leukocytosis 02/11/2023   History of COPD 02/11/2023   CKD (chronic kidney disease) stage 4, GFR 15-29 ml/min (HCC) 02/11/2023   Chronic diastolic CHF (congestive heart failure) (HCC) 02/11/2023   Multiple fractures of ribs, bilateral, initial encounter for closed fracture 02/11/2023   Rib fractures 02/10/2023   PAD (peripheral artery disease) (HCC) 05/15/2022   Syncope and collapse 02/01/2022   Peroneal tendinitis 12/04/2021   Pain in joint of left shoulder 11/16/2021   Facial droop 07/26/2021   Osteoarthritis of knees, bilateral 05/04/2021   Pain in joint of right knee 05/04/2021   Osteoarthritis of left knee 05/03/2021   COPD with acute  exacerbation (HCC) 04/18/2021   Acute hypoxic respiratory failure (HCC) 09/26/2020   B12 deficiency 08/03/2020   Diabetic polyneuropathy associated with type 2 diabetes mellitus (HCC) 07/29/2020   Diastolic dysfunction without heart failure 01/20/2020   Pain of left thumb 12/24/2019   Pain of right thumb 12/24/2019   Polyarthralgia 12/07/2019   Myalgia 12/07/2019   Anemia 12/07/2019   Fatigue 12/07/2019   Acquired thrombophilia (HCC) 07/14/2019   CAD (coronary artery disease), native coronary artery    Cervical spine pain 06/16/2019   Pleural plaque 04/27/2019   Spasm of thoracic back muscle 11/14/2018   SOB (shortness of breath) 04/17/2018   Vitamin D  deficiency 03/12/2018   Thoracic aortic aneurysm (HCC) 02/06/2018   Pain in left knee 01/14/2018   Sleep difficulties 09/09/2017   Depression 07/01/2017   Dizziness 06/10/2017   H/O renal cell carcinoma 05/23/2017   History of kidney cancer 05/23/2017   Cervical radiculopathy 10/31/2016   Hyperlipidemia associated with type 2 diabetes mellitus (HCC) 06/25/2016   Occipital neuralgia 05/09/2016   Anxiety 07/26/2015   Chronic neck pain 04/30/2015   DM2 (diabetes mellitus, type 2) (HCC) 04/27/2015   History of CVA (cerebrovascular accident) 04/26/2015   Gout 04/26/2015   Hypertension associated with type 2 diabetes mellitus (HCC) 04/26/2015   Snoring 04/26/2015   Bilateral leg edema 04/26/2015   Dysphagia 04/26/2015   GERD (gastroesophageal reflux disease) 04/26/2015   Localized edema 03/20/2015   Tobacco dependence syndrome 12/17/2007   History of colonic polyps 05/29/2007   HIATAL HERNIA 04/07/2007   Bipolar 1 disorder (HCC) 04/04/2007   RESTLESS LEG  SYNDROME 04/04/2007   RHINOSINUSITIS, 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   COPD (chronic obstructive pulmonary disease) (HCC) 04/04/2007   IRRITABLE BOWEL SYNDROME 04/04/2007   Osteoarthritis, diffuse 04/04/2007    Orientation RESPIRATION BLADDER Height & Weight     Self, Time,  Situation, Place  O2 (2L [place]) Continent, External catheter Weight: 195 lb 1.7 oz (88.5 kg) Height:  5' 5 (165.1 cm)  BEHAVIORAL SYMPTOMS/MOOD NEUROLOGICAL BOWEL NUTRITION STATUS      Continent Diet (see dc summary)  AMBULATORY STATUS COMMUNICATION OF NEEDS Skin   Extensive Assist Verbally Other (Comment) (Wound 10/21/23 surgical closed incision hip left  Incision thigh anterior left proximal   Wound 811/25 infection pretibial left)                       Personal Care Assistance Level of Assistance  Bathing, Feeding, Dressing Bathing Assistance: Limited assistance Feeding assistance: Independent Dressing Assistance: Limited assistance     Functional Limitations Info  Hearing, Sight, Speech Sight Info: Adequate Hearing Info: Adequate Speech Info: Adequate    SPECIAL CARE FACTORS FREQUENCY  PT (By licensed PT), OT (By licensed OT)     PT Frequency: 5x week OT Frequency: 5x week            Contractures      Additional Factors Info  Code Status, Allergies, Insulin  Sliding Scale Code Status Info: Full Allergies Info: Gabapentin  Clindamycin   Sulfa Antibiotics   Insulin  Sliding Scale Info: see dc summary       Current Medications (10/28/2023):  This is the current hospital active medication list Current Facility-Administered Medications  Medication Dose Route Frequency Provider Last Rate Last Admin   albuterol  (PROVENTIL ) (2.5 MG/3ML) 0.083% nebulizer solution 2.5 mg  2.5 mg Nebulization Q4H PRN Deward Eck, PA-C       alum & mag hydroxide-simeth (MAALOX/MYLANTA) 200-200-20 MG/5ML suspension 15 mL  15 mL Oral Q4H PRN Rashid, Farhan, MD       amoxicillin -clavulanate (AUGMENTIN ) 875-125 MG per tablet 1 tablet  1 tablet Oral Q12H Rashid, Farhan, MD   1 tablet at 10/28/23 9068   arformoterol  (BROVANA ) nebulizer solution 15 mcg  15 mcg Nebulization BID Deward Eck, PA-C   15 mcg at 10/28/23 0758   artificial tears ophthalmic solution 1 drop  1 drop Both Eyes PRN Rashid,  Farhan, MD   1 drop at 10/27/23 0959   bisacodyl  (DULCOLAX) EC tablet 5 mg  5 mg Oral Daily PRN Deward Eck, PA-C   5 mg at 10/26/23 9158   budesonide  (PULMICORT ) nebulizer solution 0.25 mg  0.25 mg Nebulization BID Deward Eck, PA-C   0.25 mg at 10/28/23 9196   docusate sodium  (COLACE) capsule 100 mg  100 mg Oral BID Deward Eck, PA-C   100 mg at 10/28/23 9068   enoxaparin  (LOVENOX ) injection 40 mg  40 mg Subcutaneous Q24H Deward Eck, PA-C   40 mg at 10/28/23 0930   famotidine  (PEPCID ) tablet 20 mg  20 mg Oral Daily Rashid, Farhan, MD   20 mg at 10/28/23 1114   feeding supplement (ENSURE PLUS HIGH PROTEIN) liquid 237 mL  237 mL Oral BID BM Deward Eck, PA-C   237 mL at 10/25/23 9088   ferrous sulfate  tablet 325 mg  325 mg Oral QODAY Rashid, Farhan, MD   325 mg at 10/28/23 1114   furosemide  (LASIX ) tablet 20 mg  20 mg Oral QODAY Rashid, Farhan, MD   20 mg at 10/28/23 1114   HYDROcodone -acetaminophen  (NORCO/VICODIN) 5-325 MG per  tablet 1 tablet  1 tablet Oral Q6H PRN Deward Eck, PA-C   1 tablet at 10/28/23 9250   HYDROmorphone  (DILAUDID ) injection 0.5 mg  0.5 mg Intravenous Q4H PRN Deward Eck, PA-C   0.5 mg at 10/25/23 1329   insulin  aspart (novoLOG ) injection 0-15 Units  0-15 Units Subcutaneous TID WC Deward Eck, PA-C   2 Units at 10/28/23 9058   lip balm (CARMEX) ointment 1 Application  1 Application Topical PRN Deward Eck, PA-C       menthol -cetylpyridinium (CEPACOL) lozenge 3 mg  1 lozenge Oral PRN Deward Eck, PA-C       Or   phenol (CHLORASEPTIC) mouth spray 1 spray  1 spray Mouth/Throat PRN Deward Eck, PA-C       methocarbamol  (ROBAXIN ) tablet 500 mg  500 mg Oral QID Rashid, Farhan, MD   500 mg at 10/28/23 1445   metoCLOPramide  (REGLAN ) tablet 5 mg  5 mg Oral Q8H PRN Deward Eck, PA-C       Or   metoCLOPramide  (REGLAN ) injection 5 mg  5 mg Intravenous Q8H PRN Deward Eck, PA-C       montelukast  (SINGULAIR ) tablet 10 mg  10 mg Oral QHS Deward Eck, PA-C   10 mg at 10/27/23 2130    multivitamin (RENA-VIT) tablet 1 tablet  1 tablet Oral QHS Deward Eck, PA-C   1 tablet at 10/27/23 2130   ondansetron  (ZOFRAN ) tablet 4 mg  4 mg Oral Q6H PRN Deward Eck, PA-C       Or   ondansetron  (ZOFRAN ) injection 4 mg  4 mg Intravenous Q6H PRN Deward Eck, PA-C       oxyCODONE  (OXYCONTIN ) 12 hr tablet 10 mg  10 mg Oral Q12H Rashid, Farhan, MD   10 mg at 10/28/23 0931   pantoprazole  (PROTONIX ) EC tablet 40 mg  40 mg Oral BID THEOPOLIS Deward Eck, PA-C   40 mg at 10/28/23 0931   polyethylene glycol (MIRALAX  / GLYCOLAX ) packet 17 g  17 g Oral Daily PRN Deward Eck, PA-C   17 g at 10/28/23 0941   revefenacin  (YUPELRI ) nebulizer solution 175 mcg  175 mcg Nebulization Daily Deward Eck, PA-C   175 mcg at 10/28/23 0758   rosuvastatin  (CRESTOR ) tablet 40 mg  40 mg Oral Daily Deward Eck, PA-C   40 mg at 10/28/23 9068     Discharge Medications: Please see discharge summary for a list of discharge medications.  Relevant Imaging Results:  Relevant Lab Results:   Additional Information SSN [id number]  [name] LOISE Louder, LCSW

## 2023-10-28 NOTE — Progress Notes (Signed)
                                                                                                                          Re: Kerry Powell  Date of Birth: Dec 25, 2045 Date: 10/28/23 To Whom It May Concern:  Please be advised that the above-named patient will require a short-term nursing home stay-anticipated 30 days or less for rehabilitation and strengthening. The plan is to return home.

## 2023-10-29 ENCOUNTER — Telehealth: Payer: Self-pay

## 2023-10-29 ENCOUNTER — Telehealth: Payer: Self-pay | Admitting: Orthopedic Surgery

## 2023-10-29 DIAGNOSIS — W19XXXA Unspecified fall, initial encounter: Secondary | ICD-10-CM | POA: Diagnosis not present

## 2023-10-29 DIAGNOSIS — Y92009 Unspecified place in unspecified non-institutional (private) residence as the place of occurrence of the external cause: Secondary | ICD-10-CM | POA: Diagnosis not present

## 2023-10-29 DIAGNOSIS — J9601 Acute respiratory failure with hypoxia: Secondary | ICD-10-CM | POA: Diagnosis not present

## 2023-10-29 LAB — GLUCOSE, CAPILLARY
Glucose-Capillary: 123 mg/dL — ABNORMAL HIGH (ref 70–99)
Glucose-Capillary: 131 mg/dL — ABNORMAL HIGH (ref 70–99)
Glucose-Capillary: 126 mg/dL — ABNORMAL HIGH (ref 70–99)
Glucose-Capillary: 103 mg/dL — ABNORMAL HIGH (ref 70–99)

## 2023-10-29 MED ORDER — DIPHENHYDRAMINE-ZINC ACETATE 2-0.1 % EX CREA
TOPICAL_CREAM | Freq: Two times a day (BID) | CUTANEOUS | Status: DC | PRN
  Filled 2023-10-29: qty 28

## 2023-10-29 NOTE — Telephone Encounter (Signed)
 Copied from CRM [id number]. Topic: Clinical - Order For Equipment >> Oct 25, 2023  2:25 PM Kerry Powell wrote: Reason for CRM: Informed patient she will need a walk test for a POC. Patient is upset and states that the process is too complicated and too much. Patient states she will just go to her appointments without oxygen, despite needing it.  She has a broken hip and is not likely to be able to complete a walk test. Patient hung up.   Spoke with patient she has not been discharged from hospital which they have a order in the hospital for PRN O2. [employer] will send O2 if she is discharged with it.     -NFN

## 2023-10-29 NOTE — Telephone Encounter (Signed)
 Patient called and said she can't make it on the 4th because after the surgery she fell and broke her hip and now in the hospital. [phone]

## 2023-10-29 NOTE — Plan of Care (Signed)
 Problem: Health Behavior/Discharge Planning: Goal: Ability to manage health-related needs will improve 10/29/2023 0217 by Kerry Kenneth SAILOR, RN Outcome: Progressing 10/28/2023 2002 by Kerry Kenneth SAILOR, RN Outcome: Progressing   Problem: Clinical Measurements: Goal: Ability to maintain clinical measurements within normal limits will improve 10/29/2023 0217 by Kerry Kenneth SAILOR, RN Outcome: Progressing 10/28/2023 2002 by Kerry Kenneth SAILOR, RN Outcome: Progressing Goal: Will remain free from infection 10/29/2023 0217 by Kerry Kenneth SAILOR, RN Outcome: Progressing 10/28/2023 2002 by Kerry Kenneth SAILOR, RN Outcome: Progressing Goal: Diagnostic test results will improve 10/29/2023 0217 by Kerry Kenneth SAILOR, RN Outcome: Progressing 10/28/2023 2002 by Kerry Kenneth SAILOR, RN Outcome: Progressing Goal: Respiratory complications will improve 10/29/2023 0217 by Kerry Kenneth SAILOR, RN Outcome: Progressing 10/28/2023 2002 by Kerry Kenneth SAILOR, RN Outcome: Progressing Goal: Cardiovascular complication will be avoided 10/29/2023 0217 by Kerry Kenneth SAILOR, RN Outcome: Progressing 10/28/2023 2002 by Kerry Kenneth SAILOR, RN Outcome: Progressing   Problem: Activity: Goal: Risk for activity intolerance will decrease 10/29/2023 0217 by Kerry Kenneth SAILOR, RN Outcome: Progressing 10/28/2023 2002 by Kerry Kenneth SAILOR, RN Outcome: Progressing   Problem: Nutrition: Goal: Adequate nutrition will be maintained 10/29/2023 0217 by Kerry Kenneth SAILOR, RN Outcome: Progressing 10/28/2023 2002 by Kerry Kenneth SAILOR, RN Outcome: Progressing   Problem: Coping: Goal: Level of anxiety will decrease 10/29/2023 0217 by Kerry Kenneth SAILOR, RN Outcome: Progressing 10/28/2023 2002 by Kerry Kenneth SAILOR, RN Outcome: Progressing   Problem: Elimination: Goal: Will not experience complications related to bowel motility 10/29/2023 0217 by Kerry Kenneth SAILOR, RN Outcome: Progressing 10/28/2023 2002 by Kerry Kenneth SAILOR, RN Outcome:  Progressing Goal: Will not experience complications related to urinary retention 10/29/2023 0217 by Kerry Kenneth SAILOR, RN Outcome: Progressing 10/28/2023 2002 by Kerry Kenneth SAILOR, RN Outcome: Progressing   Problem: Pain Managment: Goal: General experience of comfort will improve and/or be controlled 10/29/2023 0217 by Kerry Kenneth SAILOR, RN Outcome: Progressing 10/28/2023 2002 by Kerry Kenneth SAILOR, RN Outcome: Progressing   Problem: Safety: Goal: Ability to remain free from injury will improve 10/29/2023 0217 by Kerry Kenneth SAILOR, RN Outcome: Progressing 10/28/2023 2002 by Kerry Kenneth SAILOR, RN Outcome: Progressing   Problem: Skin Integrity: Goal: Risk for impaired skin integrity will decrease 10/29/2023 0217 by Kerry Kenneth SAILOR, RN Outcome: Progressing 10/28/2023 2002 by Kerry Kenneth SAILOR, RN Outcome: Progressing   Problem: Education: Goal: Ability to describe self-care measures that may prevent or decrease complications (Diabetes Survival Skills Education) will improve 10/29/2023 0217 by Kerry Kenneth SAILOR, RN Outcome: Progressing 10/28/2023 2002 by Kerry Kenneth SAILOR, RN Outcome: Progressing Goal: Individualized Educational Video(s) 10/29/2023 0217 by Kerry Kenneth SAILOR, RN Outcome: Progressing 10/28/2023 2002 by Kerry Kenneth SAILOR, RN Outcome: Progressing   Problem: Coping: Goal: Ability to adjust to condition or change in health will improve 10/29/2023 0217 by Kerry Kenneth SAILOR, RN Outcome: Progressing 10/28/2023 2002 by Kerry Kenneth SAILOR, RN Outcome: Progressing   Problem: Fluid Volume: Goal: Ability to maintain a balanced intake and output will improve 10/29/2023 0217 by Kerry Kenneth SAILOR, RN Outcome: Progressing 10/28/2023 2002 by Kerry Kenneth SAILOR, RN Outcome: Progressing   Problem: Health Behavior/Discharge Planning: Goal: Ability to identify and utilize available resources and services will improve 10/29/2023 0217 by Kerry Kenneth SAILOR, RN Outcome: Progressing 10/28/2023  2002 by Kerry Kenneth SAILOR, RN Outcome: Progressing Goal: Ability to manage health-related needs will improve 10/29/2023 0217 by Kerry Kenneth SAILOR, RN Outcome: Progressing 10/28/2023 2002 by Kerry Kenneth SAILOR, RN Outcome: Progressing   Problem: Metabolic: Goal: Ability to maintain appropriate  glucose levels will improve 10/29/2023 0217 by Kerry Kenneth SAILOR, RN Outcome: Progressing 10/28/2023 2002 by Kerry Kenneth SAILOR, RN Outcome: Progressing   Problem: Nutritional: Goal: Maintenance of adequate nutrition will improve 10/29/2023 0217 by Kerry Kenneth SAILOR, RN Outcome: Progressing 10/28/2023 2002 by Kerry Kenneth SAILOR, RN Outcome: Progressing Goal: Progress toward achieving an optimal weight will improve 10/29/2023 0217 by Kerry Kenneth SAILOR, RN Outcome: Progressing 10/28/2023 2002 by Kerry Kenneth SAILOR, RN Outcome: Progressing   Problem: Skin Integrity: Goal: Risk for impaired skin integrity will decrease 10/29/2023 0217 by Kerry Kenneth SAILOR, RN Outcome: Progressing 10/28/2023 2002 by Kerry Kenneth SAILOR, RN Outcome: Progressing   Problem: Tissue Perfusion: Goal: Adequacy of tissue perfusion will improve 10/29/2023 0217 by Kerry Kenneth SAILOR, RN Outcome: Progressing 10/28/2023 2002 by Kerry Kenneth SAILOR, RN Outcome: Progressing   Problem: Education: Goal: Verbalization of understanding the information provided (i.e., activity precautions, restrictions, etc) will improve 10/29/2023 0217 by Kerry Kenneth SAILOR, RN Outcome: Progressing 10/28/2023 2002 by Kerry Kenneth SAILOR, RN Outcome: Progressing Goal: Individualized Educational Video(s) 10/29/2023 0217 by Kerry Kenneth SAILOR, RN Outcome: Progressing 10/28/2023 2002 by Kerry Kenneth SAILOR, RN Outcome: Progressing   Problem: Activity: Goal: Ability to ambulate and perform ADLs will improve 10/29/2023 0217 by Kerry Kenneth SAILOR, RN Outcome: Progressing 10/28/2023 2002 by Kerry Kenneth SAILOR, RN Outcome: Progressing   Problem: Clinical  Measurements: Goal: Postoperative complications will be avoided or minimized 10/29/2023 0217 by Kerry Kenneth SAILOR, RN Outcome: Progressing 10/28/2023 2002 by Kerry Kenneth SAILOR, RN Outcome: Progressing   Problem: Self-Concept: Goal: Ability to maintain and perform role responsibilities to the fullest extent possible will improve 10/29/2023 0217 by Kerry Kenneth SAILOR, RN Outcome: Progressing 10/28/2023 2002 by Kerry Kenneth SAILOR, RN Outcome: Progressing   Problem: Pain Management: Goal: Pain level will decrease 10/29/2023 0217 by Kerry Kenneth SAILOR, RN Outcome: Progressing 10/28/2023 2002 by Kerry Kenneth SAILOR, RN Outcome: Progressing

## 2023-10-29 NOTE — Progress Notes (Addendum)
 PROGRESS NOTE    Kerry Powell  FMW:[id number] DOB: February 26, 1946 DOA: 10/21/2023 PCP: Geofm Glade PARAS, MD   Brief Narrative:   78 year old female with history of T2DM CKD 3B chronic HFpEF, COPD-on home oxygen 2 L since last discharge admitted with ground-level fall. Recently admitted and discharged on August 17 after sepsis and right lower extremity cellulitis abscess treatment and was debrided by Dr. Harden.  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  Lung showed moderate centrilobular emphysema no pleural effusion or pneumothorax. Orthopedics consulted. Pre-op evaluation done by cardiology and patient underwent left hip IM on 8/28. Pending SNF placement.  Assessment & Plan:  Principal Problem:   Fall at home, initial encounter Active Problems:   COPD (chronic obstructive pulmonary disease) (HCC)   History of CVA (cerebrovascular accident)   GERD (gastroesophageal reflux disease)   DM2 (diabetes mellitus, type 2) (HCC)   Anxiety   Hyperlipidemia associated with type 2 diabetes mellitus (HCC)   CAD (coronary artery disease), native coronary artery   PAD (peripheral artery disease) (HCC)   Chronic diastolic CHF (congestive heart failure) (HCC)   Chronic respiratory failure with hypoxia (HCC)   Closed comminuted intertrochanteric fracture of proximal end of left femur (HCC)   Impacted angulated intertrochanteric fracture of left femur, s/p left hip IM surgery done on 8/28. Fall at home, initial encounter Preop evaluation done:  CT imaging shows significant atherosclerotic disease including coronary arteries She had CT coronary scan on 2021 and last echo in 2020-with normal LV  function  Cardiology consulted for preoperative clearance. ECHO showed EF 60-65% and Grade 1 diastolic dysfunction. Lexiscan  stress test done on 8/27 showed intermediate risk. As per cardiology Given the lack of high risk stress test findings, normal ejection fraction, and planned orthopedic surgery which is not high risk from a cardiovascular perspective, would proceed with orthopedic surgery as planned Prior lexiscan  done on 03/09/2022 was unremarkable. Coronary CTA done on 07/02/19 showed coronary calcium  score of 390 and mild atherosclerosis.   COPD/emphysema Acute on chronic hypoxic resp failure: Recently discharged on supplemental oxygen. Will keep her on Pulmicort /Brovana /Yupelri  and bronchodilators as needed, I-S.  Weaned to nasal cannula at 2 L.  ABG obtained no CO2 retention pCO2 61, BNP is 95.   Recent admission for LLE purulent cellulitis/abscess/sepsis: S/p incision/debridement by Dr. Harden on 8/13 and was dced on 8/17 on 3-weeks course of oral doxycycline . Cont wound care Dced doxycyline on 8/31 as patient couldn't tolerate it and started augmentin .   CAD HLD PAD: holding Plavix  CT scan showing large burden of irregular mural thrombus particularly descending thoracic aorta/coronary atherosclerosis: Plavix  on hold.  Dr. Silver from [facility] was curbsided who advised outpatient follow-up and start [facility] once able   Hypertension: BP stable, PTA on metoprolol , telmisartan .  BP soft holding meds   Chronic HFpEF: NYHA II. Not in exacerbation  Imaging shows emphysema but no pleural effusion or edema.  Lasix  20 mg EOD, started on 9/1. She does take  lasix  20 mg prn at home.    Anemia of chronic disease: Monitor hemoglobin, resumed home iron supplement   GERD: Continue PPI   Diabetes mellitus type 2 with hyperglycemia: continue SSI   CKD stage IIIb: B/l creatinine around 1.9-2, stable.  Avoid nephrotoxic  medication monitor   Class I Obesity w/ Body mass index is 32.45  kg/m.: Will benefit with PCP follow-up, weight loss,healthy lifestyle and outpatient sleep eval if not done.  Urinary retention: In and out cath.  Disposition: Lives at home with her husband. She will need SNF. TOC consulted.    DVT prophylaxis: enoxaparin  (LOVENOX ) injection 40 mg Start: 10/25/23 0800 SCDs Start: 10/21/23 1412     Code Status: Full Code Family Communication: None at the bedside  Status is: Inpatient Remains inpatient appropriate because: s/p left hip surgery, needs SNF    Subjective:  No acute events overnight. She did talk to case manager yesterday about SNF/Rehab placement. She also mentioned to me about itching of her back.  Examination:  General exam: AAO x 3, nasal oxygen cannula in place Respiratory system: Clear to auscultation. Respiratory effort normal. Cardiovascular system: S1 & S2 heard, RRR. No JVD, murmurs, rubs, gallops or clicks. No pedal edema. Gastrointestinal system: Abdomen is nondistended, soft and nontender. No organomegaly or masses felt. Normal bowel sounds heard. Central nervous system: Alert and oriented. No focal neurological deficits. Extremities:s/p left hip surgery, dressing in place Skin: left sided neck bruise Psychiatry: Judgement and insight appear normal. Mood & affect appropriate.      Diet Orders (From admission, onward)     Start     Ordered   10/24/23 1608  Diet regular Room service appropriate? Yes; Fluid consistency: Thin  Diet effective now       Question Answer Comment  Room service appropriate? Yes   Fluid consistency: Thin      10/24/23 1607            Objective: Vitals:   10/28/23 1959 10/28/23 2355 10/29/23 0314 10/29/23 0811  BP:  128/63 123/67 126/64  Pulse: 90 94 86 88  Resp: (!) 22 15 19 11   Temp:  98.1 F (36.7 C) 97.9 F (36.6 C) 98.6 F (37 C)  TempSrc:  Oral Oral Oral  SpO2: 96% (!) 88% 90% 95%  Weight:      Height:        Intake/Output Summary (Last 24 hours) at 10/29/2023  0912 Last data filed at 10/29/2023 0555 Gross per 24 hour  Intake 240 ml  Output --  Net 240 ml   Filed Weights   10/21/23 1115 10/22/23 1026  Weight: 88.5 kg 88.5 kg    Scheduled Meds:  amoxicillin -clavulanate  1 tablet Oral Q12H   arformoterol   15 mcg Nebulization BID   budesonide  (PULMICORT ) nebulizer solution  0.25 mg Nebulization BID   docusate sodium   100 mg Oral BID   enoxaparin  (LOVENOX ) injection  40 mg Subcutaneous Q24H   famotidine   20 mg Oral Daily   feeding supplement  237 mL Oral BID BM   ferrous sulfate   325 mg Oral QODAY   furosemide   20 mg Oral QODAY   insulin  aspart  0-15 Units Subcutaneous TID WC   methocarbamol   500 mg Oral QID   montelukast   10 mg Oral QHS   multivitamin  1 tablet Oral QHS   oxyCODONE   10 mg Oral Q12H   pantoprazole   40 mg Oral BID AC   revefenacin   175 mcg Nebulization Daily   rosuvastatin   40 mg Oral Daily   Continuous Infusions:    Nutritional status Signs/Symptoms: estimated needs Interventions: Ensure Enlive (each supplement provides 350kcal and 20 grams of protein), MVI, Refer to RD note for recommendations Body mass index is 32.47 kg/m.  Data Reviewed:  CBC: Recent Labs  Lab 10/24/23 0308 10/25/23 0414 10/26/23 0519 10/27/23 0721 10/28/23 0648  WBC 12.1* 13.0* 13.4* 8.7 8.7  NEUTROABS  --   --   --   --  6.0  HGB 13.0 11.3* 10.9* 10.9* 11.1*  HCT 40.4 34.4* 33.5* 33.9* 33.8*  MCV 94.6 93.0 93.3 95.8 93.4  PLT 254 272 295 289 338   Basic Metabolic Panel: Recent Labs  Lab 10/24/23 0308 10/25/23 0414 10/26/23 0519 10/27/23 0721 10/28/23 0648  NA 134* 135 137 136 140  K 3.9 4.0 3.3* 4.0 4.3  CL 103 102 107 105 103  CO2 22 21* 20* 22 24  GLUCOSE 112* 168* 187* 112* 140*  BUN 14 17 28* 27* 23  CREATININE 1.60* 1.76* 1.57* 1.44* 1.42*  CALCIUM  8.6* 8.8* 8.5* 8.5* 8.9   GFR: Estimated Creatinine Clearance: 35.9 mL/min (A) (by C-G formula based on SCr of 1.42 mg/dL (H)). Liver Function Tests: No results  for input(s): AST, ALT, ALKPHOS, BILITOT, PROT, ALBUMIN  in the last 168 hours.  No results for input(s): LIPASE, AMYLASE in the last 168 hours. No results for input(s): AMMONIA in the last 168 hours. Coagulation Profile: No results for input(s): INR, PROTIME in the last 168 hours.  Cardiac Enzymes: No results for input(s): CKTOTAL, CKMB, CKMBINDEX, TROPONINI in the last 168 hours. BNP (last 3 results) No results for input(s): PROBNP in the last 8760 hours. HbA1C: No results for input(s): HGBA1C in the last 72 hours. CBG: Recent Labs  Lab 10/28/23 0806 10/28/23 1149 10/28/23 1712 10/28/23 2105 10/29/23 0800  GLUCAP 141* 117* 110* 115* 123*   Lipid Profile: No results for input(s): CHOL, HDL, LDLCALC, TRIG, CHOLHDL, LDLDIRECT in the last 72 hours. Thyroid  Function Tests: No results for input(s): TSH, T4TOTAL, FREET4, T3FREE, THYROIDAB in the last 72 hours. Anemia Panel: No results for input(s): VITAMINB12, FOLATE, FERRITIN, TIBC, IRON, RETICCTPCT in the last 72 hours. Sepsis Labs: No results for input(s): PROCALCITON, LATICACIDVEN in the last 168 hours.   Recent Results (from the past 240 hours)  Surgical pcr screen     Status: None   Collection Time: 10/23/23  1:05 AM   Specimen: Nasal Mucosa; Nasal Swab  Result Value Ref Range Status   MRSA, PCR NEGATIVE NEGATIVE Final   Staphylococcus aureus NEGATIVE NEGATIVE Final    Comment: (NOTE) The Xpert SA Assay (FDA approved for NASAL specimens in patients 56 years of age and older), is one component of a comprehensive surveillance program. It is not intended to diagnose infection nor to guide or monitor treatment. Performed at [facility], [address] [place], [name] [address]        Radiology Studies: No results found.     LOS: 8 days   Time spent= 39 mins    Deliliah Room, MD [facility]  If 7PM-7AM, please contact  night-coverage  10/29/2023, 9:12 AM

## 2023-10-29 NOTE — TOC Progression Note (Signed)
 Transition of Care [facility]) - Progression Note    Patient Details  Name: Kerry Powell MRN: [id number] Date of Birth: October 07, 1945  [facility]) CM/SW Contact  [name] LOISE Louder, [name] Phone Number: 10/29/2023, 12:41 PM  Clinical Narrative:     Up loaded clinicals to [facility]- waiting on determination  Will provide bed offers once available   TOC will continue to follow and assist with discharge planning.  [name] Louder, MSW, LCSW Clinical Social Worker    Expected Discharge Plan: Skilled Nursing Facility Barriers to Discharge: Awaiting State Approval (PASRR), Continued Medical Work up               Expected Discharge Plan and Services In-house Referral: Clinical Social Work   Post Acute Care Choice: Home Health, Resumption of Svcs/PTA Provider Living arrangements for the past 2 months: Single Family Home                                       Social Drivers of Health (SDOH) Interventions SDOH Screenings   Food Insecurity: No Food Insecurity (10/21/2023)  Housing: Low Risk  (10/21/2023)  Transportation Needs: No Transportation Needs (10/21/2023)  Utilities: Not At Risk (10/21/2023)  Alcohol  Screen: Low Risk  (05/15/2023)  Depression (PHQ2-9): Low Risk  (10/14/2023)  Recent Concern: Depression (PHQ2-9) - Medium Risk (07/24/2023)  Financial Resource Strain: Low Risk  (06/04/2023)  Physical Activity: Inactive (06/04/2023)  Social Connections: Socially Isolated (10/21/2023)  Stress: Stress Concern Present (06/04/2023)  Tobacco Use: Medium Risk (10/24/2023)  Health Literacy: Adequate Health Literacy (05/15/2023)    Readmission Risk Interventions     No data to display

## 2023-10-29 NOTE — Telephone Encounter (Signed)
 Please see message below. This pt is s/p a left calf I&D abscess and is now in the hospital with a fx hip.

## 2023-10-30 ENCOUNTER — Encounter: Payer: Self-pay | Admitting: Cardiology

## 2023-10-30 DIAGNOSIS — Y92009 Unspecified place in unspecified non-institutional (private) residence as the place of occurrence of the external cause: Secondary | ICD-10-CM | POA: Diagnosis not present

## 2023-10-30 DIAGNOSIS — I130 Hypertensive heart and chronic kidney disease with heart failure and stage 1 through stage 4 chronic kidney disease, or unspecified chronic kidney disease: Secondary | ICD-10-CM

## 2023-10-30 DIAGNOSIS — E1151 Type 2 diabetes mellitus with diabetic peripheral angiopathy without gangrene: Secondary | ICD-10-CM

## 2023-10-30 DIAGNOSIS — L02416 Cutaneous abscess of left lower limb: Secondary | ICD-10-CM | POA: Diagnosis not present

## 2023-10-30 DIAGNOSIS — J441 Chronic obstructive pulmonary disease with (acute) exacerbation: Secondary | ICD-10-CM | POA: Diagnosis not present

## 2023-10-30 DIAGNOSIS — M1712 Unilateral primary osteoarthritis, left knee: Secondary | ICD-10-CM

## 2023-10-30 DIAGNOSIS — A419 Sepsis, unspecified organism: Secondary | ICD-10-CM | POA: Diagnosis not present

## 2023-10-30 DIAGNOSIS — E1122 Type 2 diabetes mellitus with diabetic chronic kidney disease: Secondary | ICD-10-CM

## 2023-10-30 DIAGNOSIS — J9601 Acute respiratory failure with hypoxia: Secondary | ICD-10-CM

## 2023-10-30 DIAGNOSIS — I5032 Chronic diastolic (congestive) heart failure: Secondary | ICD-10-CM

## 2023-10-30 DIAGNOSIS — W19XXXA Unspecified fall, initial encounter: Secondary | ICD-10-CM | POA: Diagnosis not present

## 2023-10-30 DIAGNOSIS — S72142A Displaced intertrochanteric fracture of left femur, initial encounter for closed fracture: Secondary | ICD-10-CM | POA: Diagnosis not present

## 2023-10-30 DIAGNOSIS — M19072 Primary osteoarthritis, left ankle and foot: Secondary | ICD-10-CM

## 2023-10-30 DIAGNOSIS — N1832 Chronic kidney disease, stage 3b: Secondary | ICD-10-CM

## 2023-10-30 DIAGNOSIS — L03116 Cellulitis of left lower limb: Secondary | ICD-10-CM | POA: Diagnosis not present

## 2023-10-30 LAB — GLUCOSE, CAPILLARY
Glucose-Capillary: 111 mg/dL — ABNORMAL HIGH (ref 70–99)
Glucose-Capillary: 147 mg/dL — ABNORMAL HIGH (ref 70–99)
Glucose-Capillary: 96 mg/dL (ref 70–99)

## 2023-10-30 NOTE — Plan of Care (Signed)

## 2023-10-30 NOTE — TOC CM/SW Note (Signed)
 Per CSW pt had questions about her home 71- spoke with pt at bedside to discuss.  Per pt she is currently serviced by [employer]- pt reports that she has not been satisfied with the service provided by [employer]. She reports that she has not had portable 02 for going to her doctor's appointments and her doctor's have tried to send msg to [employer] regarding her getting a portable concentrator but [employer] has not responded to her providers.  Pt voiced that she would like to change to another 02 provider.  Pt plans to transition to SNF on discharge- explained to pt that she will need to let the CSW at the SNF know she will need assistance with changing 02 providers prior to d/c from SNF. Pt will need new documentation for home 02 to assist in arranging home 02 with another provider- explained to pt that the staff at the SNF will be able to assist with this.  Pt voiced understanding.

## 2023-10-30 NOTE — TOC Progression Note (Signed)
 Transition of Care [facility]) - Progression Note    Patient Details  Name: Kerry Powell MRN: [id number] Date of Birth: 18-Feb-1946  Transition of Care [facility]) CM/SW Contact  [name] LOISE Louder, [name] Phone Number: 10/30/2023, 4:03 PM  Clinical Narrative:     Insurance pending [facility] reference # [id number]  Expected Discharge Plan: Skilled Nursing Facility Barriers to Discharge: Insurance Authorization               Expected Discharge Plan and Services In-house Referral: Clinical Social Work   Post Acute Care Choice: Home Health, Resumption of Svcs/PTA Provider Living arrangements for the past 2 months: Single Family Home                                       Social Drivers of Health (SDOH) Interventions SDOH Screenings   Food Insecurity: No Food Insecurity (10/21/2023)  Housing: Low Risk  (10/21/2023)  Transportation Needs: No Transportation Needs (10/21/2023)  Utilities: Not At Risk (10/21/2023)  Alcohol  Screen: Low Risk  (05/15/2023)  Depression (PHQ2-9): Low Risk  (10/14/2023)  Recent Concern: Depression (PHQ2-9) - Medium Risk (07/24/2023)  Financial Resource Strain: Low Risk  (06/04/2023)  Physical Activity: Inactive (06/04/2023)  Social Connections: Socially Isolated (10/21/2023)  Stress: Stress Concern Present (06/04/2023)  Tobacco Use: Medium Risk (10/24/2023)  Health Literacy: Adequate Health Literacy (05/15/2023)    Readmission Risk Interventions     No data to display

## 2023-10-30 NOTE — Plan of Care (Signed)
  Problem: Health Behavior/Discharge Planning: Goal: Ability to manage health-related needs will improve Outcome: Progressing   Problem: Clinical Measurements: Goal: Ability to maintain clinical measurements within normal limits will improve Outcome: Progressing Goal: Will remain free from infection Outcome: Progressing Goal: Diagnostic test results will improve Outcome: Progressing Goal: Respiratory complications will improve Outcome: Progressing Goal: Cardiovascular complication will be avoided Outcome: Progressing   Problem: Activity: Goal: Risk for activity intolerance will decrease Outcome: Progressing   Problem: Nutrition: Goal: Adequate nutrition will be maintained Outcome: Progressing   Problem: Coping: Goal: Level of anxiety will decrease Outcome: Progressing   Problem: Elimination: Goal: Will not experience complications related to bowel motility Outcome: Progressing Goal: Will not experience complications related to urinary retention Outcome: Progressing   Problem: Pain Managment: Goal: General experience of comfort will improve and/or be controlled Outcome: Progressing   Problem: Safety: Goal: Ability to remain free from injury will improve Outcome: Progressing   Problem: Skin Integrity: Goal: Risk for impaired skin integrity will decrease Outcome: Progressing   Problem: Education: Goal: Ability to describe self-care measures that may prevent or decrease complications (Diabetes Survival Skills Education) will improve Outcome: Progressing Goal: Individualized Educational Video(s) Outcome: Progressing   Problem: Coping: Goal: Ability to adjust to condition or change in health will improve Outcome: Progressing   Problem: Fluid Volume: Goal: Ability to maintain a balanced intake and output will improve Outcome: Progressing   Problem: Health Behavior/Discharge Planning: Goal: Ability to identify and utilize available resources and services will  improve Outcome: Progressing Goal: Ability to manage health-related needs will improve Outcome: Progressing   Problem: Metabolic: Goal: Ability to maintain appropriate glucose levels will improve Outcome: Progressing   Problem: Nutritional: Goal: Maintenance of adequate nutrition will improve Outcome: Progressing Goal: Progress toward achieving an optimal weight will improve Outcome: Progressing   Problem: Skin Integrity: Goal: Risk for impaired skin integrity will decrease Outcome: Progressing   Problem: Tissue Perfusion: Goal: Adequacy of tissue perfusion will improve Outcome: Progressing   Problem: Education: Goal: Verbalization of understanding the information provided (i.e., activity precautions, restrictions, etc) will improve Outcome: Progressing Goal: Individualized Educational Video(s) Outcome: Progressing   Problem: Activity: Goal: Ability to ambulate and perform ADLs will improve Outcome: Progressing   Problem: Clinical Measurements: Goal: Postoperative complications will be avoided or minimized Outcome: Progressing   Problem: Self-Concept: Goal: Ability to maintain and perform role responsibilities to the fullest extent possible will improve Outcome: Progressing   Problem: Pain Management: Goal: Pain level will decrease Outcome: Progressing

## 2023-10-30 NOTE — TOC Progression Note (Signed)
 Transition of Care [facility]) - Progression Note    Patient Details  Name: Kerry Powell MRN: [id number] Date of Birth: 1945/11/23  Transition of Care [facility]) CM/SW Contact  [name] LOISE Louder, [name] Phone Number: 10/30/2023, 11:54 AM  Clinical Narrative:     CSW met with patient at bedside. Patient has decided on [facility] [place].  [facility] confirmed availability.  TOC will start authorization.  [name] Louder, MSW, LCSW Clinical Social Worker     Expected Discharge Plan: Skilled Nursing Facility Barriers to Discharge: Insurance Authorization               Expected Discharge Plan and Services In-house Referral: Clinical Social Work   Post Acute Care Choice: Home Health, Resumption of Svcs/PTA Provider Living arrangements for the past 2 months: Single Family Home                                       Social Drivers of Health (SDOH) Interventions SDOH Screenings   Food Insecurity: No Food Insecurity (10/21/2023)  Housing: Low Risk  (10/21/2023)  Transportation Needs: No Transportation Needs (10/21/2023)  Utilities: Not At Risk (10/21/2023)  Alcohol  Screen: Low Risk  (05/15/2023)  Depression (PHQ2-9): Low Risk  (10/14/2023)  Recent Concern: Depression (PHQ2-9) - Medium Risk (07/24/2023)  Financial Resource Strain: Low Risk  (06/04/2023)  Physical Activity: Inactive (06/04/2023)  Social Connections: Socially Isolated (10/21/2023)  Stress: Stress Concern Present (06/04/2023)  Tobacco Use: Medium Risk (10/24/2023)  Health Literacy: Adequate Health Literacy (05/15/2023)    Readmission Risk Interventions     No data to display

## 2023-10-30 NOTE — TOC Progression Note (Signed)
 Transition of Care [facility]) - Progression Note    Patient Details  Name: Kerry Powell MRN: [id number] Date of Birth: 01-03-1946  Transition of Care [facility]) CM/SW Contact  [name] LOISE Louder, [name] Phone Number: 10/30/2023, 10:40 AM  Clinical Narrative:     [name] [facility] # [id number] E Good from 9/2 to 10/2  Expected Discharge Plan: Skilled Nursing Facility Barriers to Discharge: Awaiting State Approval [name]), Continued Medical Work up               Expected Discharge Plan and Services In-house Referral: Clinical Social Work   Post Acute Care Choice: Home Health, Resumption of Svcs/PTA Provider Living arrangements for the past 2 months: Single Family Home                                       Social Drivers of Health (SDOH) Interventions SDOH Screenings   Food Insecurity: No Food Insecurity (10/21/2023)  Housing: Low Risk  (10/21/2023)  Transportation Needs: No Transportation Needs (10/21/2023)  Utilities: Not At Risk (10/21/2023)  Alcohol  Screen: Low Risk  (05/15/2023)  Depression (PHQ2-9): Low Risk  (10/14/2023)  Recent Concern: Depression (PHQ2-9) - Medium Risk (07/24/2023)  Financial Resource Strain: Low Risk  (06/04/2023)  Physical Activity: Inactive (06/04/2023)  Social Connections: Socially Isolated (10/21/2023)  Stress: Stress Concern Present (06/04/2023)  Tobacco Use: Medium Risk (10/24/2023)  Health Literacy: Adequate Health Literacy (05/15/2023)    Readmission Risk Interventions     No data to display

## 2023-10-30 NOTE — Progress Notes (Signed)
 PROGRESS NOTE MCKENSI REDINGER  FMW:[id number] DOB: 24-Aug-1945 DOA: 10/21/2023 PCP: Geofm Glade PARAS, MD  Brief Narrative/Hospital Course:  T2DM CKD 3B chronic HFpEF, COPD-on home oxygen 2 L since last discharge admitted with ground-level fall. Recently admitted and discharged on August 17 after sepsis and right lower extremity cellulitis abscess treatment and was debrided by Dr. Harden.  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 lung showed moderate centrilobular emphysema no pleural effusion or pneumothorax.Orthopedics consulted. Pre-op evaluation done by cardiology and patient underwent left hip IM on 8/28. Pending SNF placement.  Subjective: Seen and examined today Resting well on 2l [place] home setting Pain controlled. Not much appetite Overnight afebrile BP stable  Assessment and plan:  Impacted angulated intertrochanteric fracture of left femur, s/p left hip IM surgery done on 8/28. Fall at home, initial encounter: Patient has undergone surgery continue multimodal pain management, PT OT at this time waiting for placement DVT prophylaxis Lovenox .  Continue multimodal pain management  PreOP eval: Cardiology consulted for preoperative clearance. Coronary CTA done on 07/02/19 showed coronary calcium  score of 390 and mild atherosclerosis.   COPD/emphysema Acute on chronic hypoxic resp failure-on 2l [place] x 3 wk: Recently discharged on supplemental oxygen. Will keep her on Pulmicort /Brovana /Yupelri  and bronchodilators as needed, I-S.  Weaned to nasal cannula at 2 L.  ABG obtained no CO2 retention pCO2 61, BNP is 95.   Recent admission for LLE purulent cellulitis/abscess/sepsis: S/p  incision/debridement by Dr. Harden on 8/13 and was dced on 8/17 on 3-weeks course of oral doxycycline . Cont wound care. Stopped doxycyline on 8/31 due to intolerance, and started augmentin .   CAD HLD PAD: holding Plavix  CT scan showing large burden of irregular mural thrombus particularly descending thoracic aorta/coronary atherosclerosis: Plavix  on hold.  Dr. Silver from [facility] was curbsided who advised outpatient follow-up and start [facility] once able Patient had preop workup -ECHO showed EF 60-65% and Grade 1 diastolic dysfunction. Lexiscan  stress test done on 8/27 showed intermediate risk. As per cardiology Given the lack of high risk stress test findings, normal ejection fraction, and planned orthopedic surgery which is not high risk from a cardiovascular perspective, would proceed with orthopedic surgery as planned Prior lexiscan  done on 03/09/2022 was unremarkable.   Hypertension: BP well-controlled .soft holding  metoprolol , telmisartan .   Chronic HFpEF: NYHA II. Not in exacerbation Imaging shows emphysema but no pleural effusion or edema.  Continue on Lasix   Net IO Since Admission: -3,092.75 mL [10/30/23 0744]    Anemia of chronic disease: Monitor hemoglobin, resumed home iron supplement   GERD: Continue PPI   Diabetes mellitus type 2 with hyperglycemia: Well-controlled, continue SSI   CKD stage IIIb: B/l creatinine around 1.9-2, stable.Avoid nephrotoxic medication monitor   Class I Obesity w/ Body mass index is 32.45 kg/m.: Will benefit with PCP follow-up, weight loss,healthy lifestyle and outpatient sleep eval if not done.   Urinary retention: In and out cath.   Class I Obesity w/ Body mass index is 32.47 kg/m.: Will benefit with PCP follow-up, weight loss,healthy lifestyle and outpatient sleep eval if not done.  Mobility: PT Orders: Active  PT Follow up Rec: Skilled Nursing-Short Term Rehab (<3 Hours/Day)10/28/2023 1322    DVT prophylaxis: enoxaparin  (LOVENOX ) injection  40 mg Start: 10/25/23 0800 SCDs Start:  10/21/23 1412 Code Status:   Code Status: Full Code Family Communication: plan of care discussed with patient/daughter at bedside. Patient status is: Remains hospitalized because of severity of illness Level of care: Progressive   Dispo: The patient is from: home            Anticipated disposition: TBD Objective: Vitals last 24 hrs: Vitals:   10/29/23 1931 10/29/23 1947 10/29/23 2320 10/30/23 0342  BP: 105/75  122/62 108/61  Pulse: 94  (!) 105 88  Resp: 15  19 19   Temp: 98.4 F (36.9 C)  97.8 F (36.6 C) 97.7 F (36.5 C)  TempSrc: Oral  Oral Axillary  SpO2: 95% 94% 97% 92%  Weight:      Height:        Physical Examination: General exam: AAOX3 HEENT:Oral mucosa moist, Ear/Nose WNL grossly Respiratory system: B/l clear Cardiovascular system: S1 & S2 +, No JVD. Gastrointestinal system: Abdomen soft,NT,ND, BS+ Nervous System: Alert, awake, moving all extremities,and following commands. Extremities: LE edema neg, distal extremities warm.  Skin: No rashes,no icterus. MSK: Left hip surgical site dressing  c/d/i  Medications reviewed:  Scheduled Meds:  amoxicillin -clavulanate  1 tablet Oral Q12H   arformoterol   15 mcg Nebulization BID   budesonide  (PULMICORT ) nebulizer solution  0.25 mg Nebulization BID   docusate sodium   100 mg Oral BID   enoxaparin  (LOVENOX ) injection  40 mg Subcutaneous Q24H   famotidine   20 mg Oral Daily   feeding supplement  237 mL Oral BID BM   ferrous sulfate   325 mg Oral QODAY   furosemide   20 mg Oral QODAY   insulin  aspart  0-15 Units Subcutaneous TID WC   methocarbamol   500 mg Oral QID   montelukast   10 mg Oral QHS   multivitamin  1 tablet Oral QHS   oxyCODONE   10 mg Oral Q12H   pantoprazole   40 mg Oral BID AC   revefenacin   175 mcg Nebulization Daily   rosuvastatin   40 mg Oral Daily   Continuous Infusions:   Diet: Diet Order             Diet regular Room service appropriate? Yes; Fluid  consistency: Thin  Diet effective now                    Data Reviewed: I have personally reviewed following labs and imaging studies ( see epic result tab) CBC: Recent Labs  Lab 10/24/23 0308 10/25/23 0414 10/26/23 0519 10/27/23 0721 10/28/23 0648  WBC 12.1* 13.0* 13.4* 8.7 8.7  NEUTROABS  --   --   --   --  6.0  HGB 13.0 11.3* 10.9* 10.9* 11.1*  HCT 40.4 34.4* 33.5* 33.9* 33.8*  MCV 94.6 93.0 93.3 95.8 93.4  PLT 254 272 295 289 338   CMP: Recent Labs  Lab 10/24/23 0308 10/25/23 0414 10/26/23 0519 10/27/23 0721 10/28/23 0648  NA 134* 135 137 136 140  K 3.9 4.0 3.3* 4.0 4.3  CL 103 102 107 105 103  CO2 22 21* 20* 22 24  GLUCOSE 112* 168* 187* 112* 140*  BUN 14 17 28* 27* 23  CREATININE 1.60* 1.76* 1.57* 1.44* 1.42*  CALCIUM  8.6* 8.8* 8.5* 8.5* 8.9   GFR: Estimated Creatinine Clearance: 35.9 mL/min (A) (by C-G formula based on SCr of 1.42 mg/dL (H)). No results for input(s): AST, ALT, ALKPHOS, BILITOT, PROT, ALBUMIN  in the last 168 hours. No results for input(s): LIPASE, AMYLASE in the last 168 hours. No results for input(s):  AMMONIA in the last 168 hours. Coagulation Profile: No results for input(s): INR, PROTIME in the last 168 hours. Unresulted Labs (From admission, onward)     Start     Ordered   10/31/23 0500  Creatinine, serum  (enoxaparin  (LOVENOX )  CrCl >/= 30 mL/min  )  Weekly,   R     Comments: while on enoxaparin  therapy.   Question:  Specimen collection method  Answer:  Lab=Lab collect   10/24/23 1607           Antimicrobials/Microbiology: Anti-infectives (From admission, onward)    Start     Dose/Rate Route Frequency Ordered Stop   10/27/23 1115  amoxicillin -clavulanate (AUGMENTIN ) 875-125 MG per tablet 1 tablet        1 tablet Oral Every 12 hours 10/27/23 1015     10/26/23 2130  doxycycline  (VIBRA -TABS) tablet 100 mg  Status:  Discontinued        100 mg Oral Every 12 hours 10/26/23 1047 10/27/23 0832   10/24/23 1900   ceFAZolin  (ANCEF ) IVPB 2g/100 mL premix        2 g 200 mL/hr over 30 Minutes Intravenous Every 6 hours 10/24/23 1607 10/25/23 0159   10/24/23 1115  ceFAZolin  (ANCEF ) IVPB 2g/100 mL premix        2 g 200 mL/hr over 30 Minutes Intravenous On call to O.R. 10/24/23 1102 10/24/23 1243   10/24/23 1114  ceFAZolin  (ANCEF ) 2-4 GM/100ML-% IVPB       Note to Pharmacy: LORILEE, GRETA: cabinet override      10/24/23 1114 10/24/23 1303   10/22/23 0600  ceFAZolin  (ANCEF ) IVPB 2g/100 mL premix  Status:  Discontinued        2 g 200 mL/hr over 30 Minutes Intravenous On call to O.R. 10/21/23 1404 10/22/23 1228   10/21/23 2200  doxycycline  (VIBRA -TABS) tablet 100 mg  Status:  Discontinued        100 mg Oral Every 12 hours 10/21/23 1915 10/21/23 2044   10/21/23 2200  doxycycline  (VIBRAMYCIN ) 100 mg in sodium chloride  0.9 % 250 mL IVPB  Status:  Discontinued        100 mg 125 mL/hr over 120 Minutes Intravenous Every 12 hours 10/21/23 2044 10/26/23 1047         Component Value Date/Time   SDES BLOOD BLOOD LEFT ARM 10/06/2023 1932   SPECREQUEST  10/06/2023 1932    BOTTLES DRAWN AEROBIC AND ANAEROBIC Blood Culture results may not be optimal due to an inadequate volume of blood received in culture bottles   CULT  10/06/2023 1932    NO GROWTH 5 DAYS Performed at [facility], [address], [place], [name] [address]    REPTSTATUS 10/11/2023 FINAL 10/06/2023 1932    Procedures: Procedure(s) (LRB): FIXATION, FRACTURE, INTERTROCHANTERIC, WITH INTRAMEDULLARY ROD (Left)   Mennie LAMY, MD [facility] 10/30/2023, 11:12 AM

## 2023-10-30 NOTE — Telephone Encounter (Signed)
 I called and sw pt to advise of message below. She will call with any questions and will follow up when she d/c the hospital.

## 2023-10-30 NOTE — Progress Notes (Signed)
 Occupational Therapy Treatment Patient Details Name: Kerry Powell MRN: [id number] DOB: 1945/08/03 Today's Date: 10/30/2023   History of present illness Kerry Powell is a 78 yo female who presented 78 y.o. female presents to [facility] hospital on 10/21/2023 after fall. Pt found to have intertrochanteric fx of L femur. Pt underwent IM nailing of L femur on 8/28. PMH includes DMII, CKD, HFpEF, COPD.   OT comments  Pt progressing toward goals this session, needing up to mod A for ADLs, supervision for bed mobility, and min A for transfers with RW. Pt ambulating to bathroom for toileting, reports feeling lightheaded while exiting bathroom, SpO2 with unreliable pleth in 70s/80s, incr O2 to 4L  and pt 80s, though returned to 90s once seated and back on 3L O2, RN notified. Pt presenting with impairments listed below, will follow acutely. Patient will benefit from continued inpatient follow up therapy, <3 hours/day to maximize safety/ind with ADL/functional mobility.        If plan is discharge home, recommend the following:  Assist for transportation;Assistance with cooking/housework;A little help with walking and/or transfers;A lot of help with bathing/dressing/bathroom   Equipment Recommendations  None recommended by OT    Recommendations for Other Services PT consult    Precautions / Restrictions Precautions Precautions: Fall Recall of Precautions/Restrictions: Intact Precaution/Restrictions Comments: 2L O2 at baseline Restrictions Weight Bearing Restrictions Per Provider Order: Yes LLE Weight Bearing Per Provider Order: Weight bearing as tolerated       Mobility Bed Mobility Overal bed mobility: Needs Assistance Bed Mobility: Sit to Supine     Supine to sit: Supervision          Transfers Overall transfer level: Needs assistance Equipment used: Rolling walker (2 wheels) Transfers: Sit to/from Stand Sit to Stand: Min assist           General transfer comment: light min A  to stand from lower surface, needed bed elevated to stand     Balance Overall balance assessment: Needs assistance Sitting-balance support: No upper extremity supported, Feet supported Sitting balance-Leahy Scale: Good     Standing balance support: Reliant on assistive device for balance Standing balance-Leahy Scale: Poor                             ADL either performed or assessed with clinical judgement   ADL Overall ADL's : Needs assistance/impaired Eating/Feeding: Set up;Sitting   Grooming: Contact guard assist;Wash/dry hands;Standing;Sitting;Brushing hair           Upper Body Dressing : Minimal assistance;Sitting Upper Body Dressing Details (indicate cue type and reason): donning gown on backside Lower Body Dressing: Moderate assistance Lower Body Dressing Details (indicate cue type and reason): dons R sock without assist, assist for L sock Toilet Transfer: Ambulation;Rolling walker (2 wheels);Regular Toilet;Minimal assistance Toilet Transfer Details (indicate cue type and reason): min A to stand from low commode surface Toileting- Clothing Manipulation and Hygiene: Supervision/safety;Sitting/lateral lean Toileting - Clothing Manipulation Details (indicate cue type and reason): seated pericare     Functional mobility during ADLs: Minimal assistance;Rolling walker (2 wheels)      Extremity/Trunk Assessment Upper Extremity Assessment Upper Extremity Assessment: Generalized weakness   Lower Extremity Assessment Lower Extremity Assessment: Defer to PT evaluation        Vision       Perception Perception Perception: Not tested   Praxis Praxis Praxis: Not tested   Communication Communication Communication: No apparent difficulties   Cognition Arousal: Alert  Behavior During Therapy: WFL for tasks assessed/performed Cognition: No apparent impairments             OT - Cognition Comments: anixous/fearful of falling                  Following commands: Intact        Cueing   Cueing Techniques: Verbal cues  Exercises      Shoulder Instructions       General Comments pt lightheaded once leaving bathroom, SpO2 with poor wave pleth, incr to 4L, and pt up to 80s/90s, returned to 3L O2 once seated and pt satting 90s    Pertinent Vitals/ Pain       Pain Assessment Pain Assessment: Faces Pain Score: 5  Faces Pain Scale: Hurts even more Pain Location: LLE Pain Descriptors / Indicators: Aching, Tender Pain Intervention(s): Limited activity within patient's tolerance, Monitored during session, Repositioned  Home Living                                          Prior Functioning/Environment              Frequency  Min 2X/week        Progress Toward Goals  OT Goals(current goals can now be found in the care plan section)  Progress towards OT goals: Progressing toward goals  Acute Rehab OT Goals Patient Stated Goal: to decr pain OT Goal Formulation: With patient Time For Goal Achievement: 11/08/23 Potential to Achieve Goals: Good ADL Goals Pt Will Perform Grooming: with modified independence;standing Pt Will Perform Lower Body Dressing: with modified independence;with adaptive equipment;sit to/from stand Pt Will Transfer to Toilet: with modified independence;ambulating;regular height toilet  Plan      Co-evaluation                 AM-PAC OT 6 Clicks Daily Activity     Outcome Measure   Help from another person eating meals?: A Little Help from another person taking care of personal grooming?: A Little Help from another person toileting, which includes using toliet, bedpan, or urinal?: A Little Help from another person bathing (including washing, rinsing, drying)?: A Lot Help from another person to put on and taking off regular upper body clothing?: A Little Help from another person to put on and taking off regular lower body clothing?: A Lot 6 Click Score: 16     End of Session Equipment Utilized During Treatment: Rolling walker (2 wheels)  OT Visit Diagnosis: Unsteadiness on feet (R26.81);Pain Pain - Right/Left: Left Pain - part of body: Hip;Leg   Activity Tolerance Patient tolerated treatment well   Patient Left in chair;with call bell/phone within reach;with chair alarm set   Nurse Communication Mobility status        Time: 9251-9183 OT Time Calculation (min): 28 min  Charges: OT General Charges $OT Visit: 1 Visit OT Treatments $Self Care/Home Management : 23-37 mins  Dryden Tapley K, OTD, OTR/L SecureChat [facility]erred Acute Re[facility] [phone]   Malya Cirillo K Koonce 10/30/2023, 9:04 AM

## 2023-10-30 NOTE — Progress Notes (Signed)
 Nutrition Follow-up  DOCUMENTATION CODES:   Obesity unspecified, Severe malnutrition in context of acute illness/injury  INTERVENTION:  Encourage PO intake - currently on regular diet Renal MVI daily given CKD stage IIIb Ensure Plus High Protein po BID, each supplement provides 350 kcal and 20 grams of protein Mighty Shake TID with meals, each supplement provides 330 kcals and 9 grams of protein sake Add cottage cheese and fruit to meal trays   NUTRITION DIAGNOSIS:   Severe Malnutrition related to acute illness as evidenced by moderate muscle depletion, meal completion < 50%, percent weight loss (225 lbs to 205 lbs a 20 lb, 9% weight loss in 3 months.). - New dx  GOAL:   Patient will meet greater than or equal to 90% of their needs - Progressing   MONITOR:   PO intake, Supplement acceptance  REASON FOR ASSESSMENT:   Consult Hip fracture protocol  ASSESSMENT:   78 year old female who presented to the ED on 10/21/23 after a ground-level fall. PMH of T2DM, CKD stage IIIb, HFpEF, COPD. Pt admitted with left hip fracture.  8/28 - s/p left hip IM surgery   Pt anxious on visit as her blood pressure dropped when working with PT. She is concerned that her health is going backwards. RD reassured her she is progressing with therapies and doing well. Pt with recent admission 10/06/2023-8/17,2025 for sepsis and left lower extremity purulent cellulitis and abscess that had to be debrided. Pt discharged on antibiotics. Pt then had a ground level fall and left hip fracture s/p left IM surgery.  Pt reports prior to her admission in early August she had not noticed any difference in her appetite or intake. She states her UBW is around 230-235 lbs and has lost 30 lbs in the last 2-3 months. Weight history confirms weight loss going from 225 lbs to 205 lbs a 20 lb, 9% weight loss in 3 months. Which is clinically significant. Pt also with edema on exam  which indicates that current weight may be  falsely elevated due to fluid.   Since her admission in early August she reports she has only had cottage cheese and fruit throughout the day and food does not taste good anymore. Pt has not been eating well since admission, she reports having no appetite. Has been eating 50-75% of her meals depending on the day however she does skip some meals. Some of her meals are really small consisting of just soup or a biscuit. Has been refusing Ensure but did try sips of one yesterday and is willing to increase her intake of these. Only had 25% of a biscuit this morning.   Pt with increased needs related to hip fracture, existing wounds, and post-op healing. Encouraged increased PO intake with small meals and snacks and use of Ensures. Pt declined snacks in between meals but wanted to have cottage cheese and fruit added to her meal trays. She does not have her dentures here and declined a softer texture diet.    Disposition: SNF  Admit weight: 88.5 kg Current weight: 88.5 kg   Ubw: 234 2-3 months Ago   Average Meal Intake: 8/29-9/1: 40% intake x 6 recorded meals  Nutritionally Relevant Medications: Scheduled Meds:  feeding supplement  237 mL Oral BID BM   ferrous sulfate   325 mg Oral QODAY   furosemide   20 mg Oral QODAY   insulin  aspart  0-15 Units Subcutaneous TID WC   multivitamin  1 tablet Oral QHS   Labs Reviewed: Last labs: Creatinine  1.42 GFR 38 CBG ranges from 103-131 mg/dL over the last 24 hours HgbA1c 6.4  NUTRITION - FOCUSED PHYSICAL EXAM:  Flowsheet Row Most Recent Value  Orbital Region No depletion  Upper Arm Region No depletion  Thoracic and Lumbar Region No depletion  Buccal Region No depletion  Temple Region No depletion  Clavicle Bone Region No depletion  Clavicle and Acromion Bone Region No depletion  Scapular Bone Region No depletion  Dorsal Hand No depletion  Patellar Region Moderate depletion  Anterior Thigh Region Moderate depletion  Posterior Calf Region  Moderate depletion  Edema (RD Assessment) Moderate  Hair Reviewed  Eyes Reviewed  Mouth Reviewed  Skin Reviewed  Nails Reviewed    Diet Order:   Diet Order             Diet regular Room service appropriate? Yes; Fluid consistency: Thin  Diet effective now                   EDUCATION NEEDS:   Education needs have been addressed  Skin:  Skin Assessment: Skin Integrity Issues: Skin Integrity Issues:: Other (Comment) Other: wound (location not listed), wound infection LLE  Last BM:  10/20/23  Height:   Ht Readings from Last 1 Encounters:  10/22/23 5' 5 (1.651 m)    Weight:   Wt Readings from Last 1 Encounters:  10/30/23 93 kg    Ideal Body Weight:  56.8 kg  BMI:  Body mass index is 34.12 kg/m.  Estimated Nutritional Needs:   Kcal:  1700-1900  Protein:  85-100 grams  Fluid:  1.7-1.9 L   Olivia Kenning, RD Registered Dietitian  See Amion for more information

## 2023-10-30 NOTE — Progress Notes (Signed)
 Physical Therapy Treatment Patient Details Name: Kerry Powell MRN: [id number] DOB: 1945-06-01 Today's Date: 10/30/2023   History of Present Illness Kerry Powell is a 78 yo female who presented 78 y.o. female presents to [facility] hospital on 10/21/2023 after fall. Pt found to have intertrochanteric fx of L femur. Pt underwent IM nailing of L femur on 8/28. PMH includes DMII, CKD, HFpEF, COPD.    PT Comments  Pt is limited by reports of significant LLE pain as well as hypotension upon PT arrival. BP when seated in recliner is 80/45 at the time of PT presentation, pt appears pale. PT assists pt in transfer back to bed with improvement in BP to 109/65. Pt declines ambulation attempts today due to pain. Patient will benefit from continued inpatient follow up therapy, <3 hours/day.    If plan is discharge home, recommend the following: A little help with walking and/or transfers;A lot of help with bathing/dressing/bathroom;Assistance with cooking/housework;Assist for transportation;Help with stairs or ramp for entrance   Can travel by private vehicle     Yes  Equipment Recommendations  None recommended by PT    Recommendations for Other Services       Precautions / Restrictions Precautions Precautions: Fall Recall of Precautions/Restrictions: Intact Precaution/Restrictions Comments: 2L O2 at baseline Restrictions Weight Bearing Restrictions Per Provider Order: Yes LLE Weight Bearing Per Provider Order: Weight bearing as tolerated     Mobility  Bed Mobility Overal bed mobility: Needs Assistance Bed Mobility: Sit to Supine       Sit to supine: Min assist   General bed mobility comments: assist for LLE    Transfers Overall transfer level: Needs assistance Equipment used: Rolling walker (2 wheels) Transfers: Sit to/from Stand, Bed to chair/wheelchair/BSC Sit to Stand: Min assist   Step pivot transfers: Contact guard assist            Ambulation/Gait Ambulation/Gait  assistance:  (deferred due to hypotension upon PT arrival and pt reports of significant pain)                 Stairs             Wheelchair Mobility     Tilt Bed    Modified Rankin (Stroke Patients Only)       Balance Overall balance assessment: Needs assistance Sitting-balance support: No upper extremity supported, Feet supported Sitting balance-Leahy Scale: Good     Standing balance support: Bilateral upper extremity supported, Reliant on assistive device for balance Standing balance-Leahy Scale: Poor                              Communication Communication Communication: No apparent difficulties  Cognition Arousal: Alert Behavior During Therapy: WFL for tasks assessed/performed   PT - Cognitive impairments: No apparent impairments                         Following commands: Intact      Cueing Cueing Techniques: Verbal cues  Exercises Other Exercises Other Exercises: PT encourages increasing frequency of HEP to 3x a day, including ankle pumps, SLR, hip abduction/adduction, glute sets    General Comments General comments (skin integrity, edema, etc.): upon PT arrival pt's BP found to be 80/45 in recliner, pt appears pale and reports feeling fluid buildup from CHF along with significant LLE pain. BP after return to supine in bed is 109/65      Pertinent Vitals/Pain Pain  Assessment Pain Assessment: Faces Faces Pain Scale: Hurts whole lot Pain Location: LLE Pain Descriptors / Indicators: Sore Pain Intervention(s): Monitored during session    Home Living                          Prior Function            PT Goals (current goals can now be found in the care plan section) Acute Rehab PT Goals Patient Stated Goal: to return to independence Progress towards PT goals: Not progressing toward goals - comment (limited by hypotension and pain)    Frequency    Min 3X/week      PT Plan      Co-evaluation               AM-PAC PT 6 Clicks Mobility   Outcome Measure  Help needed turning from your back to your side while in a flat bed without using bedrails?: A Little Help needed moving from lying on your back to sitting on the side of a flat bed without using bedrails?: A Little Help needed moving to and from a bed to a chair (including a wheelchair)?: A Little Help needed standing up from a chair using your arms (e.g., wheelchair or bedside chair)?: A Little Help needed to walk in hospital room?: A Lot Help needed climbing 3-5 steps with a railing? : Total 6 Click Score: 15    End of Session Equipment Utilized During Treatment: Gait belt Activity Tolerance: Patient limited by fatigue;Patient limited by pain Patient left: in bed;with call bell/phone within reach;with bed alarm set Nurse Communication: Mobility status PT Visit Diagnosis: Other abnormalities of gait and mobility (R26.89)     Time: 8960-8944 PT Time Calculation (min) (ACUTE ONLY): 16 min  Charges:    $Therapeutic Activity: 8-22 mins PT General Charges $$ ACUTE PT VISIT: 1 Visit                     Bernardino JINNY Ruth, PT, DPT Acute Rehabilitation Office [phone]    Bernardino JINNY Ruth 10/30/2023, 11:06 AM

## 2023-10-31 ENCOUNTER — Encounter: Admitting: Orthopedic Surgery

## 2023-10-31 DIAGNOSIS — E43 Unspecified severe protein-calorie malnutrition: Secondary | ICD-10-CM | POA: Insufficient documentation

## 2023-10-31 DIAGNOSIS — W19XXXA Unspecified fall, initial encounter: Secondary | ICD-10-CM | POA: Diagnosis not present

## 2023-10-31 DIAGNOSIS — Y92009 Unspecified place in unspecified non-institutional (private) residence as the place of occurrence of the external cause: Secondary | ICD-10-CM | POA: Diagnosis not present

## 2023-10-31 DIAGNOSIS — S72142A Displaced intertrochanteric fracture of left femur, initial encounter for closed fracture: Secondary | ICD-10-CM | POA: Diagnosis not present

## 2023-10-31 LAB — GLUCOSE, CAPILLARY
Glucose-Capillary: 98 mg/dL (ref 70–99)
Glucose-Capillary: 211 mg/dL — ABNORMAL HIGH (ref 70–99)

## 2023-10-31 LAB — CREATININE, SERUM
Creatinine, Ser: 1.45 mg/dL — ABNORMAL HIGH (ref 0.44–1.00)
GFR, Estimated: 37 mL/min — ABNORMAL LOW (ref >60)

## 2023-10-31 MED ORDER — CLOPIDOGREL BISULFATE 75 MG PO TABS
75.0000 mg | ORAL_TABLET | Freq: Every day | ORAL | Status: DC
  Administered 2023-10-31: 75 mg via ORAL
  Filled 2023-10-31: qty 1

## 2023-10-31 MED ORDER — METHOCARBAMOL 500 MG PO TABS
500.0000 mg | ORAL_TABLET | Freq: Four times a day (QID) | ORAL | Status: DC | PRN
Start: 2023-10-31 — End: 2023-11-22

## 2023-10-31 MED ORDER — POLYETHYLENE GLYCOL 3350 17 G PO PACK: 17.0000 g | PACK | Freq: Two times a day (BID) | ORAL | 0 refills | Status: AC

## 2023-10-31 MED ORDER — BISACODYL 10 MG RE SUPP: 10.0000 mg | Freq: Every day | RECTAL | Status: DC | PRN

## 2023-10-31 MED ORDER — OXYCODONE HCL 5 MG PO TABS: 5.0000 mg | ORAL_TABLET | Freq: Four times a day (QID) | ORAL | 0 refills | Status: DC | PRN

## 2023-10-31 MED ORDER — POLYETHYLENE GLYCOL 3350 17 G PO PACK
17.0000 g | PACK | Freq: Two times a day (BID) | ORAL | Status: DC
  Administered 2023-10-31: 17 g via ORAL
  Filled 2023-10-31: qty 1

## 2023-10-31 MED ORDER — DULOXETINE HCL 60 MG PO CPEP
60.0000 mg | ORAL_CAPSULE | Freq: Every day | ORAL | Status: DC
  Administered 2023-10-31: 60 mg via ORAL
  Filled 2023-10-31: qty 1

## 2023-10-31 MED ORDER — BISACODYL 10 MG RE SUPP: 10.0000 mg | Freq: Every day | RECTAL | 0 refills | Status: DC | PRN

## 2023-10-31 NOTE — Discharge Summary (Signed)
 Physician Discharge Summary  [name] FMW:[id number] DOB: 12/28/1945 DOA: 10/21/2023  PCP: Geofm Glade PARAS, MD  Admit date: 10/21/2023 Discharge date: 10/31/2023 Recommendations for Outpatient Follow-up:  Follow up with PCP in 1 weeks-call for appointment Please obtain BMP/CBC in one week  Discharge Dispo: SNF Discharge Condition: Stable Code Status:   Code Status: Full Code Diet recommendation:  Diet Order             Diet general           Diet regular Room service appropriate? Yes; Fluid consistency: Thin  Diet effective now                    Brief/Interim Summary:  T2DM CKD 3B chronic HFpEF, COPD-on home oxygen 2 L since last discharge admitted with ground-level fall. Recently admitted and discharged on August 17 after sepsis and right lower extremity cellulitis abscess treatment and was debrided by Dr. Harden.  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 lung showed moderate centrilobular emphysema no pleural effusion or pneumothorax.Orthopedics consulted. Pre-op evaluation done by cardiology and patient underwent left hip IM on 8/28.  Pending SNF placement.  Remains medically stable  Subjective: Seen and examined today Resting comfortably Last bowel movement 8/24 No acute events overnight  Discharge diagnosis:  Impacted angulated intertrochanteric fracture of left femur, s/p left hip IM surgery done on 8/28. Fall at home, initial encounter: S/P ORIF with IM rod. Cont multimodal pain management, PT OT,DVT prophylaxis w/ Lovenox .  Message sent to Dr. Celena for DVT prophylaxis recommendation for discharge-I was informed by Francis [facility] PA that upon discharge the  patient will not need Lovenox  and okay to continue only Plavix   PreOP eval: Cardiology consulted for preoperative clearance. Coronary CTA done on 07/02/19 showed coronary calcium  score of 390 and mild atherosclerosis.   COPD/emphysema Acute on chronic hypoxic resp failure-on 2l [place] x 3 wk: Recently discharged on supplemental oxygen. Will keep her on Pulmicort /Brovana /Yupelri  and bronchodilators as needed, I-S.  Weaned to nasal cannula at 2 L. ABG obtained no CO2 retention pCO2 61, BNP is 95.   Recent admission for LLE purulent cellulitis/abscess/sepsis: S/p incision/debridement by Dr. Harden on 8/13 and was dced on 8/17 on 3-weeks course of oral doxycycline . Cont wound care. Stopped doxycyline on 8/31 due to intolerance, and started augmentin .  EOT was 10/29/2022   CAD HLD PAD: CT scan showing large burden of irregular mural thrombus particularly descending thoracic aorta/coronary atherosclerosis: Plavix  on hold.  Dr. Silver from [facility] was curbsided who advised outpatient follow-up and start [facility] once able Patient had preop workup -ECHO showed EF 60-65% and Grade 1 diastolic dysfunction. Lexiscan  stress test done on 8/27 showed intermediate risk. As per cardiology Given the lack of high risk stress test findings, normal ejection fraction, and planned orthopedic surgery which is not high risk from a cardiovascular perspective, would proceed with orthopedic surgery as planned Prior lexiscan  done on 03/09/2022 was unremarkable. Continue on home meds -resume Plavix ,crestor .  Hypertension: BP stable. Has not needed amlodipine , telmisartan , and metoprolol  inpatient will discontinue.   Chronic HFpEF: NYHA II. Not in exacerbation Imaging shows emphysema but no pleural effusion or edema.  Continue on Lasix   Net IO Since Admission: -2,972.75 mL [10/31/23 1121]  Anemia of chronic disease: Monitor hemoglobin, resumed home iron supplement   GERD: Continue PPI   Diabetes mellitus type 2 with  hyperglycemia: Well-controlled, continue SSI   CKD stage IIIb: B/l creatinine around 1.9-2, currently at 1.4. stable, monitor avoid nephrotoxic agents   Class I Obesity w/ Body mass index is 32.45 kg/m.: Will benefit with PCP follow-up, weight loss,healthy lifestyle and outpatient sleep eval if not done.   Urinary retention: Prn In and out cath.  Constipation: Intensify bowel regimen with scheduled stool softener, MiraLAX  and PRN Dulcolax   Class I Obesity w/ Body mass index is 34.12 kg/m.: Will benefit with PCP follow-up, weight loss,healthy lifestyle and outpatient sleep eval if not done.  Mobility: PT Orders: Active  PT Follow up Rec: Skilled Nursing-Short Term Rehab (<3 Hours/Day)10/30/2023 1055    DVT prophylaxis: enoxaparin  (LOVENOX ) injection 40 mg Start: 10/25/23 0800 SCDs Start: 10/21/23 1412 Code Status:   Code Status: Full Code Family Communication: plan of care discussed with patient/daughter at bedside. Patient status is: Remains hospitalized because of severity of illness Level of care: Progressive   Dispo: The patient is from: home            Anticipated disposition: Awaiting placement  Objective: Vitals last 24 hrs: Vitals:   10/30/23 2001 10/30/23 2258 10/31/23 0323 10/31/23 0747  BP: 121/62 122/71 (!) 106/57 126/67  Pulse: 100 90  82  Resp: 20 14  18   Temp: 98.6 F (37 C) 98.5 F (36.9 C) 97.9 F (36.6 C) 97.8 F (36.6 C)  TempSrc: Axillary Oral Oral Oral  SpO2: 98% 100%  97%  Weight:      Height:       Physical Examination: General exam: AAOX3 HEENT:Oral mucosa moist, Ear/Nose WNL grossly Respiratory system: B/l clear breath sounds no use of accessory muscles Cardiovascular system: S1 & S2 +, No JVD. Gastrointestinal system: Abdomen soft,NT,ND, BS+ Nervous System: Alert, awake, moving all extremities,and following commands. Extremities: LE edema neg, distal extremities warm.  Skin: No rashes,no icterus. MSK: Left hip surgical site dressing   c/d/i  Medications reviewed:  Scheduled Meds:  amoxicillin -clavulanate  1 tablet Oral Q12H   arformoterol   15 mcg Nebulization BID   budesonide  (PULMICORT ) nebulizer solution  0.25 mg Nebulization BID   clopidogrel   75 mg Oral Daily   docusate sodium   100 mg Oral BID   DULoxetine   60 mg Oral Daily   enoxaparin  (LOVENOX ) injection  40 mg Subcutaneous Q24H   famotidine   20 mg Oral Daily   feeding supplement  237 mL Oral BID BM   ferrous sulfate   325 mg Oral QODAY   furosemide   20 mg Oral QODAY   insulin  aspart  0-15 Units Subcutaneous TID WC   methocarbamol   500 mg Oral QID   montelukast   10 mg Oral QHS   multivitamin  1 tablet Oral QHS   oxyCODONE   10 mg Oral Q12H   pantoprazole   40 mg Oral BID AC   polyethylene glycol  17 g Oral BID   revefenacin   175 mcg Nebulization Daily   rosuvastatin   40 mg Oral Daily  Continuous Infusions: Diet: Diet Order             Diet regular Room service appropriate? Yes; Fluid consistency: Thin  Diet effective now                  Procedure(s) (LRB): FIXATION, FRACTURE, INTERTROCHANTERIC, WITH INTRAMEDULLARY ROD (Left)  Consultation: See note.  Discharge Instructions  Discharge Instructions  Diet general   Complete by: As directed    Discharge instructions   Complete by: As directed    Follow-up with PCP, orthopedics postop care  Please call call MD or return to ER for similar or worsening recurring problem that brought you to hospital or if any fever,nausea/vomiting,abdominal pain, uncontrolled pain, chest pain,  shortness of breath or any other alarming symptoms.  Please follow-up your doctor as instructed in a week time and call the office for appointment.  Please avoid alcohol , smoking, or any other illicit substance and maintain healthy habits including taking your regular medications as prescribed.  You were cared for by a hospitalist during your hospital stay. If you have any questions about your discharge medications  or the care you received while you were in the hospital after you are discharged, you can call the unit and ask to speak with the hospitalist on call if the hospitalist that took care of you is not available.  Once you are discharged, your primary care physician will handle any further medical issues. Please note that NO REFILLS for any discharge medications will be authorized once you are discharged, as it is imperative that you return to your primary care physician (or establish a relationship with a primary care physician if you do not have one) for your aftercare needs so that they can reassess your need for medications and monitor your lab values   Discharge wound care:   Complete by: As directed    Reinforce dressing as needed follow-up with PCP 2 weeks postop discharge   Increase activity slowly   Complete by: As directed       Allergies as of 10/31/2023       Reactions   Gabapentin Other (See Comments)   Fluid retention    Clindamycin  Rash   Sulfa Antibiotics Nausea And Vomiting        Medication List     STOP taking these medications    amLODipine  5 MG tablet Commonly known as: NORVASC    doxycycline  100 MG tablet Commonly known as: VIBRA -TABS   metoprolol  tartrate 25 MG tablet Commonly known as: LOPRESSOR    telmisartan  40 MG tablet Commonly known as: MICARDIS    traMADol  50 MG tablet Commonly known as: ULTRAM        TAKE these medications    albuterol  (2.5 MG/3ML) 0.083% nebulizer solution Commonly known as: PROVENTIL  USE 1 VIAL IN NEBULIZER EVERY 6 HOURS AND AS NEEDED FOR SHORT BREATH/WHEEZE.  Generic:  Ventolin    allopurinol  300 MG tablet Commonly known as: ZYLOPRIM  Take 1 tablet by mouth once daily   bisacodyl  10 MG suppository Commonly known as: DULCOLAX Place 1 suppository (10 mg total) rectally daily as needed for moderate constipation.   budesonide -formoterol  160-4.5 MCG/ACT inhaler Commonly known as: Symbicort  Inhale 2 puffs into the lungs 2  (two) times daily.   cholecalciferol 25 MCG (1000 UNIT) tablet Commonly known as: VITAMIN D3 Take 2,000 Units by mouth daily.   clopidogrel  75 MG tablet Commonly known as: PLAVIX  Take 1 tablet by mouth once daily   cyclobenzaprine  5 MG tablet Commonly known as: FLEXERIL  Take 1 tablet (5 mg total) by mouth 3 (three) times daily as needed. for muscle spams   DULoxetine  30 MG capsule Commonly known as: CYMBALTA  Take 2 capsules (60 mg total) by mouth daily.   Farxiga  10 MG Tabs tablet Generic drug: dapagliflozin  propanediol Take 1 tablet (10 mg total) by mouth daily. Take 10 mg by mouth daily.   ferrous sulfate  325 (  65 FE) MG tablet Take 325 mg by mouth every other day.   fluticasone  50 MCG/ACT nasal spray Commonly known as: FLONASE  Place 2 sprays into both nostrils daily. What changed:  when to take this reasons to take this   furosemide  20 MG tablet Commonly known as: LASIX  Take 1 tablet (20 mg total) by mouth daily as needed for fluid or edema.   methocarbamol  500 MG tablet Commonly known as: ROBAXIN  Take 1 tablet (500 mg total) by mouth every 6 (six) hours as needed for muscle spasms.   montelukast  10 MG tablet Commonly known as: SINGULAIR  TAKE 1 TABLET BY MOUTH ONCE DAILY AT BEDTIME   Mounjaro  5 MG/0.5ML Pen Generic drug: tirzepatide  INJECT 1/2 (ONE-HALF) ML SUBCUTANEOUSLY  ONCE A WEEK What changed: See the new instructions.   oxyCODONE  5 MG immediate release tablet Commonly known as: Oxy IR/ROXICODONE  Take 1 tablet (5 mg total) by mouth every 6 (six) hours as needed for up to 4 doses for moderate pain (pain score 4-6).   pantoprazole  40 MG tablet Commonly known as: PROTONIX  Take 1 tablet (40 mg total) by mouth 2 (two) times daily before a meal.   polyethylene glycol 17 g packet Commonly known as: MIRALAX  / GLYCOLAX  Take 17 g by mouth 2 (two) times daily.   rosuvastatin  40 MG tablet Commonly known as: CRESTOR  Take 1 tablet by mouth once daily   sodium  chloride 0.65 % Soln nasal spray Commonly known as: OCEAN Place 1 spray into both nostrils as needed for congestion.   tiotropium 18 MCG inhalation capsule Commonly known as: Spiriva  HandiHaler Place 1 capsule (18 mcg total) into inhaler and inhale daily.   ZyrTEC  Allergy 10 MG tablet Generic drug: cetirizine  Take 10 mg by mouth daily as needed for allergies or rhinitis.               Discharge Care Instructions  (From admission, onward)           Start     Ordered   10/31/23 0000  Discharge wound care:       Comments: Reinforce dressing as needed follow-up with PCP 2 weeks postop discharge   10/31/23 1122            Contact information for follow-up providers     Home 02 needs Follow up.   Why: Pt will need assistance from CSW at SNF to change home 02 providers prior to return home from SNF- pt currently with [facility] and would like to change to a different provider- please follow up for home 02 needs prior to discharge from SNF        Burns, Glade PARAS, MD Follow up in 1 week(s).   Specialty: Internal Medicine Contact information: [address] [name] [address] [phone]         [name], MD. Schedule an appointment as soon as possible for a visit in 2 week(s).   Specialty: Orthopedic Surgery Contact information: [address] [name] [address] [phone]              Contact information for after-discharge care     Destination     [facility], [place] .   Service: Skilled Nursing Contact information: [address] [place]  [phone]                    Allergies  Allergen Reactions   Gabapentin Other (See Comments)    Fluid retention  Clindamycin  Rash   Sulfa Antibiotics Nausea And Vomiting    The results of significant diagnostics from this hospitalization (including imaging, microbiology, ancillary and laboratory) are listed below for reference.     Microbiology: Recent Results (from the past 240 hours)  Surgical pcr screen     Status: None   Collection Time: 10/23/23  1:05 AM   Specimen: Nasal Mucosa; Nasal Swab  Result Value Ref Range Status   MRSA, PCR NEGATIVE NEGATIVE Final   Staphylococcus aureus NEGATIVE NEGATIVE Final    Comment: (NOTE) The Xpert SA Assay (FDA approved for NASAL specimens in patients 41 years of age and older), is one component of a comprehensive surveillance program. It is not intended to diagnose infection nor to guide or monitor treatment. Performed at [facility], [address], [place], [name] [address]     Procedures/Studies: DG HIP UNILAT WITH PELVIS 2-3 VIEWS LEFT Result Date: 10/24/2023 CLINICAL DATA:  02020 Hip fracture (HCC) [id number]. EXAM: DG HIP (WITH OR WITHOUT PELVIS) 2-3V LEFT COMPARISON:  None Available. FINDINGS: There is mildly displaced left femur intertrochanteric peri-implant fracture. No other acute fracture or dislocation. No aggressive osseous lesion. Visualized sacral arcuate lines are unremarkable. Unremarkable symphysis pubis. There are mild degenerative changes of bilateral hip joints characterized by joint space narrowing and osteophytosis of the superior acetabulum. No radiopaque foreign bodies. IMPRESSION: Mildly displaced left femur intertrochanteric peri-implant fracture. Electronically Signed   By: Ree Molt M.D.   On: 10/24/2023 15:40   DG FEMUR MIN 2 VIEWS LEFT Result Date: 10/24/2023 CLINICAL DATA:  Intertrochanteric fracture fixation EXAM: LEFT FEMUR 2 VIEWS COMPARISON:  CT pelvis 10/21/2023 FINDINGS: Fluoroscopic spot images depict the left hip IM nail system observed with single distal interlocking screw, traversing the previously characterized intertrochanteric fracture. No new periprosthetic fracture or acute complicating feature identified. IMPRESSION: 1. Left hip IM nail system traversing the previously characterized intertrochanteric fracture.  Electronically Signed   By: Ryan Salvage M.D.   On: 10/24/2023 14:08   DG C-Arm 1-60 Min-No Report Result Date: 10/24/2023 Fluoroscopy was utilized by the requesting physician.  No radiographic interpretation.   DG C-Arm 1-60 Min-No Report Result Date: 10/24/2023 Fluoroscopy was utilized by the requesting physician.  No radiographic interpretation.   NM Myocar Multi W/Spect W/Wall Motion / EF Result Date: 10/23/2023 CLINICAL DATA:  78 year old female with chest pain. High risk coronary artery disease. EXAM: MYOCARDIAL IMAGING WITH SPECT (REST AND PHARMACOLOGIC-STRESS) GATED LEFT VENTRICULAR WALL MOTION STUDY LEFT VENTRICULAR EJECTION FRACTION TECHNIQUE: Standard myocardial SPECT imaging was performed after resting intravenous injection of 10 mCi Tc-66m tetrofosmin . Subsequently, intravenous infusion of Lexiscan  was performed under the supervision of the Cardiology staff. At peak effect of the drug, 30 mCi Tc-80m tetrofosmin  was injected intravenously and standard myocardial SPECT imaging was performed. Quantitative gated imaging was also performed to evaluate left ventricular wall motion, and estimate left ventricular ejection fraction. COMPARISON:  None Available. FINDINGS: Perfusion: There is motion degradation of the imaging. Moderate size region of decreased counts in the mid and basilar segment of the inferolateral wall which improves from stress to rest imaging. Wall Motion: Normal left ventricular wall motion. No left ventricular dilation. Motion degradation Left Ventricular Ejection Fraction: Greater than 70 % End diastolic volume 42 ml End systolic volume 7 ml IMPRESSION: 1. Significant motion degradation of imaging. Potential reversible ischemia within the inferolateral wall. 2. Normal left ventricular wall motion. 3. Left ventricular ejection fraction greater than 70% 4. Non invasive risk stratification*: Intermediate *2012 Appropriate  Use Criteria for Coronary Revascularization Focused  Update: J Am Coll Cardiol. 2012;59(9):857-881. http://content.dementiazones.com.aspx?articleid=[email or web address] Electronically Signed   By: Jackquline Boxer M.D.   On: 10/23/2023 16:38   ECHOCARDIOGRAM COMPLETE Result Date: 10/22/2023    ECHOCARDIOGRAM REPORT   Patient Name:   Kerry Powell Date of Exam: 10/22/2023 Medical Rec #:  [id number]      Height:       65.0 in Accession #:    [id number]     Weight:       195.1 lb Date of Birth:  Oct 07, 1945      BSA:          1.957 m Patient Age:    78 years       BP:           105/73 mmHg Patient Gender: F              HR:           88 bpm. Exam Location:  Inpatient Procedure: 2D Echo, Cardiac Doppler and Color Doppler (Both Spectral and Color            Flow Doppler were utilized during procedure). Indications:    Chest Pain R07.9  History:        Patient has prior history of Echocardiogram examinations, most                 recent 04/24/2018. CHF, CAD, PAD, COPD and CKD, stage 4,                 Signs/Symptoms:Shortness of Breath and Syncope; Risk                 Factors:Hypertension, Diabetes, Dyslipidemia and Current Smoker.  Sonographer:    Thea Norlander RCS Referring Phys: [id number] TAYLOR A PARCELLS IMPRESSIONS  1. Left ventricular ejection fraction, by estimation, is 60 to 65%. The left ventricle has normal function. Left ventricular endocardial border not optimally defined to evaluate regional wall motion. There is mild concentric left ventricular hypertrophy. Left ventricular diastolic parameters are consistent with Grade I diastolic dysfunction (impaired relaxation).  2. Right ventricular systolic function is normal. The right ventricular size is normal. There is normal pulmonary artery systolic pressure. The estimated right ventricular systolic pressure is 21.8 mmHg.  3. Left atrial size was mildly dilated.  4. The mitral valve is normal in structure. No evidence of mitral valve regurgitation. No evidence of mitral stenosis.  5. The aortic valve is tricuspid. There  is mild calcification of the aortic valve. Aortic valve regurgitation is not visualized. No aortic stenosis is present.  6. The inferior vena cava is normal in size with greater than 50% respiratory variability, suggesting right atrial pressure of 3 mmHg.  7. Technically difficult study with poor acoustic windows. FINDINGS  Left Ventricle: Left ventricular ejection fraction, by estimation, is 60 to 65%. The left ventricle has normal function. Left ventricular endocardial border not optimally defined to evaluate regional wall motion. The left ventricular internal cavity size was normal in size. There is mild concentric left ventricular hypertrophy. Left ventricular diastolic parameters are consistent with Grade I diastolic dysfunction (impaired relaxation). Right Ventricle: The right ventricular size is normal. Right vetricular wall thickness was not well visualized. Right ventricular systolic function is normal. There is normal pulmonary artery systolic pressure. The tricuspid regurgitant velocity is 2.17 m/s, and with an assumed right atrial pressure of 3 mmHg, the estimated right ventricular systolic pressure is 21.8 mmHg. Left Atrium: Left atrial size was  mildly dilated. Right Atrium: Right atrial size was normal in size. Pericardium: There is no evidence of pericardial effusion. Mitral Valve: The mitral valve is normal in structure. Mild mitral annular calcification. No evidence of mitral valve regurgitation. No evidence of mitral valve stenosis. Tricuspid Valve: The tricuspid valve is normal in structure. Tricuspid valve regurgitation is trivial. Aortic Valve: The aortic valve is tricuspid. There is mild calcification of the aortic valve. Aortic valve regurgitation is not visualized. No aortic stenosis is present. Aortic valve peak gradient measures 5.9 mmHg. Pulmonic Valve: The pulmonic valve was not well visualized. Pulmonic valve regurgitation is not visualized. Aorta: The aortic root is normal in size and  structure. Venous: The inferior vena cava is normal in size with greater than 50% respiratory variability, suggesting right atrial pressure of 3 mmHg. IAS/Shunts: No atrial level shunt detected by color flow Doppler.  LEFT VENTRICLE PLAX 2D LVIDd:         3.70 cm   Diastology LVIDs:         2.50 cm   LV e' medial:    12.40 cm/s LV PW:         1.40 cm   LV E/e' medial:  5.0 LV IVS:        1.10 cm   LV e' lateral:   12.60 cm/s LVOT diam:     2.10 cm   LV E/e' lateral: 4.9 LV SV:         60 LV SV Index:   30 LVOT Area:     3.46 cm  IVC IVC diam: 1.70 cm LEFT ATRIUM           Index LA diam:      4.50 cm 2.30 cm/m LA Vol (A4C): 38.9 ml 19.87 ml/m  AORTIC VALVE AV Area (Vmax): 2.82 cm AV Vmax:        121.00 cm/s AV Peak Grad:   5.9 mmHg LVOT Vmax:      98.50 cm/s LVOT Vmean:     63.400 cm/s LVOT VTI:       0.172 m  AORTA Ao Root diam: 3.50 cm Ao Asc diam:  3.60 cm MITRAL VALVE                TRICUSPID VALVE MV Area (PHT): 4.10 cm     TR Peak grad:   18.8 mmHg MV Decel Time: 185 msec     TR Vmax:        217.00 cm/s MV E velocity: 61.80 cm/s MV A velocity: 120.00 cm/s  SHUNTS MV E/A ratio:  0.52         Systemic VTI:  0.17 m                             Systemic Diam: 2.10 cm Dalton McleanMD Electronically signed by Ezra Kanner Signature Date/Time: 10/22/2023/5:56:13 PM    Final    DG Chest Port 1 View Result Date: 10/21/2023 CLINICAL DATA:  Trauma, fall EXAM: PORTABLE CHEST 1 VIEW COMPARISON:  10/06/2023 FINDINGS: Underpenetrated AP portable chest radiograph. Mild cardiomegaly. No acute abnormality of the lungs. No acute osseous findings. IMPRESSION: Underpenetrated AP portable chest radiograph. Mild cardiomegaly. No acute abnormality of the lungs. Electronically Signed   By: Marolyn JONETTA Jaksch M.D.   On: 10/21/2023 12:36   DG Pelvis Portable Result Date: 10/21/2023 CLINICAL DATA:  Trauma, fall EXAM: PORTABLE PELVIS 1-2 VIEWS COMPARISON:  None Available. FINDINGS: Impacted, angulated intratrochanteric fractures of  the left femur. No displaced fracture of the pelvis or proximal right femur in single frontal view. IMPRESSION: 1. Impacted, angulated intratrochanteric fractures of the left femur. 2. No displaced fracture of the pelvis or proximal right femur in single frontal view. Electronically Signed   By: Marolyn JONETTA Jaksch M.D.   On: 10/21/2023 12:33   DG Elbow Complete Left Result Date: 10/21/2023 CLINICAL DATA:  Fall, pain EXAM: LEFT ELBOW - COMPLETE 3+ VIEW COMPARISON:  None Available. FINDINGS: No displaced fracture or dislocation of the left elbow. Obliquity of lateral view provided for review limits assessment for effusion. No obvious effusion. Soft tissues unremarkable. IMPRESSION: No displaced fracture or dislocation of the left elbow. Obliquity of lateral view provided for review limits assessment for effusion. No obvious effusion. Electronically Signed   By: Marolyn JONETTA Jaksch M.D.   On: 10/21/2023 12:28   CT CHEST ABDOMEN PELVIS W CONTRAST Result Date: 10/21/2023 CLINICAL DATA:  Trauma, fall EXAM: CT CHEST, ABDOMEN, AND PELVIS WITH CONTRAST TECHNIQUE: Multidetector CT imaging of the chest, abdomen and pelvis was performed following the standard protocol during bolus administration of intravenous contrast. RADIATION DOSE REDUCTION: This exam was performed according to the departmental dose-optimization program which includes automated exposure control, adjustment of the mA and/or kV according to patient size and/or use of iterative reconstruction technique. CONTRAST:  60mL OMNIPAQUE  IOHEXOL  350 MG/ML SOLN COMPARISON:  None Available. FINDINGS: CT CHEST FINDINGS Cardiovascular: Severe aortic atherosclerosis with a large burden of irregular mural thrombus particularly throughout the descending vessel. Normal heart size. Three-vessel coronary artery calcifications. No pericardial effusion. Mediastinum/Nodes: No enlarged mediastinal, hilar, or axillary lymph nodes. Thyroid  gland, trachea, and esophagus demonstrate no  significant findings. Lungs/Pleura: Moderate centrilobular emphysema. No pleural effusion or pneumothorax. Musculoskeletal: No chest wall abnormality. No acute osseous findings. Multiple subacute, callused fractures of the anterolateral left ribs (series 3, image 43). CT ABDOMEN PELVIS FINDINGS Hepatobiliary: No solid liver abnormality is seen. No gallstones, gallbladder wall thickening, or biliary dilatation. Pancreas: Unremarkable. No pancreatic ductal dilatation or surrounding inflammatory changes. Spleen: Normal in size without significant abnormality. Adrenals/Urinary Tract: Adrenal glands are unremarkable. Severely atrophic left kidney. Scarring and atrophy of the posterior cortex of the right kidney. No calculi or hydronephrosis. Bladder is unremarkable. Stomach/Bowel: Stomach is within normal limits. Appendix not clearly visualized. No evidence of bowel wall thickening, distention, or inflammatory changes. Vascular/Lymphatic: Severe aortic atherosclerosis. No enlarged abdominal or pelvic lymph nodes. Reproductive: Hysterectomy. Other: No abdominal wall hernia or abnormality. No ascites. Musculoskeletal: Impacted, angulated intratrochanteric fractures of the left femur (series 3, image 120, series 6, image 55) IMPRESSION: 1. Impacted, angulated intratrochanteric fractures of the left femur. 2. Multiple subacute, callused fractures of the anterolateral left ribs. 3. No other CT evidence of acute traumatic injury to the chest, abdomen, or pelvis. 4. Severe aortic atherosclerosis with a large burden of irregular mural thrombus particularly throughout the descending thoracic aorta. 5. Coronary artery disease. Aortic Atherosclerosis (ICD10-I70.0) and Emphysema (ICD10-J43.9). Electronically Signed   By: Marolyn JONETTA Jaksch M.D.   On: 10/21/2023 12:27   CT HEAD WO CONTRAST Result Date: 10/21/2023 CLINICAL DATA:  Provided history: Head trauma, moderate/severe. Head CT Polytrauma, blunt. EXAM: CT HEAD WITHOUT CONTRAST CT  CERVICAL SPINE WITHOUT CONTRAST TECHNIQUE: Multidetector CT imaging of the head and cervical spine was performed following the standard protocol without intravenous contrast. Multiplanar CT image reconstructions of the cervical spine were also generated. RADIATION DOSE REDUCTION: This exam was performed according to the departmental dose-optimization program which includes automated exposure control, adjustment  of the mA and/or kV according to patient size and/or use of iterative reconstruction technique. COMPARISON:  Head CT 01/21/2022.  Cervical spine CT 01/21/2022. FINDINGS: CT HEAD FINDINGS Brain: No age-advanced or lobar predominant cerebral atrophy. Chronic lacunar infarcts again demonstrated within the right caudate nucleus and right thalamus. Prominent perivascular space versus chronic lacunar infarct within the left lentiform nucleus, unchanged. Background mild cerebral white matter chronic small vessel ischemic disease, better appreciated on the prior brain MRI 06/09/2017. There is no acute intracranial hemorrhage. No demarcated cortical infarct. No extra-axial fluid collection. No evidence of an intracranial mass. No midline shift. Vascular: No hyperdense vessel.  Atherosclerotic calcifications. Skull: No calvarial fracture or aggressive osseous lesion. Sinuses/Orbits: No mass or acute finding within the imaged orbits. Minimal mucosal thickening within the left frontal sinus and within bilateral ethmoid air cells. Other: Large left-sided scalp hematoma. CT CERVICAL SPINE FINDINGS Alignment: Mild levocurvature of the cervical spine. Nonspecific reversal of the expected cervical doses. 2 mm C2-C3 grade 1 anterolisthesis. 4 mm C4-C5 grade 1 anterolisthesis. Slight grade 1 anterolisthesis at C5-C6, C6-C7, C7-T1, T1-T2 and T3-T4. Skull base and vertebrae: The basion-dental and atlanto-dental intervals are maintained.No evidence of acute fracture to the cervical spine. 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 Subcentimeter sclerotic focus within the left aspect of the T3 vertebral body, nonspecific but likely reflecting a bone island. Soft tissues and spinal canal: No prevertebral fluid or swelling. No visible canal hematoma. Disc levels: Cervical spondylosis with multilevel disc space narrowing, disc bulges/central disc protrusions and overall advanced facet arthropathy. No appreciable high-grade spinal canal stenosis. Multilevel bony neural foraminal narrowing. Degenerative changes also present at the atlantooccipital articulations (right greater than left) and at the C1-C2 articulation. Upper chest: No consolidation within the imaged lung apices. No visible pneumothorax. Biapical pleuroparenchymal scarring. Emphysema. Aortic atherosclerosis. IMPRESSION: CT head: 1.  No evidence of an acute intracranial abnormality. 2. Large left scalp hematoma. 3. 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 CT cervical spine: 1. No evidence of an acute cervical spine fracture. 2. Nonspecific reversal of the expected cervical lordosis. 3. Grade 1 anterolisthesis at C2-C3, C4-C5, C5-C6, C6-C7, C7-T1, T1-T2 and T3-T4. 4. Cervical spondylosis as described. 5. Aortic Atherosclerosis (ICD10-I70.0) and Emphysema (ICD10-J43.9). Electronically Signed   By: Rockey Childs D.O.   On: 10/21/2023 12:06   CT CERVICAL SPINE WO CONTRAST Result Date: 10/21/2023 CLINICAL DATA:  Provided history: Head trauma, moderate/severe. Head CT Polytrauma, blunt. EXAM: CT HEAD WITHOUT CONTRAST CT CERVICAL SPINE WITHOUT CONTRAST TECHNIQUE: Multidetector CT imaging of the head and cervical spine was performed following the standard protocol without intravenous contrast. Multiplanar CT image reconstructions of the cervical spine were also generated. RADIATION DOSE REDUCTION: This exam was performed according to the departmental dose-optimization program which includes automated exposure control,  adjustment of the mA and/or kV according to patient size and/or use of iterative reconstruction technique. COMPARISON:  Head CT 01/21/2022.  Cervical spine CT 01/21/2022. FINDINGS: CT HEAD FINDINGS Brain: No age-advanced or lobar predominant cerebral atrophy. Chronic lacunar infarcts again demonstrated within the right caudate nucleus and right thalamus. Prominent perivascular space versus chronic lacunar infarct within the left lentiform nucleus, unchanged. Background mild cerebral white matter chronic small vessel ischemic disease, better appreciated on the prior brain MRI 06/09/2017. There is no acute intracranial hemorrhage. No demarcated cortical infarct. No extra-axial fluid collection. No evidence of an intracranial mass. No midline shift. Vascular: No hyperdense vessel.  Atherosclerotic calcifications. Skull: No calvarial fracture or  aggressive osseous lesion. Sinuses/Orbits: No mass or acute finding within the imaged orbits. Minimal mucosal thickening within the left frontal sinus and within bilateral ethmoid air cells. Other: Large left-sided scalp hematoma. CT CERVICAL SPINE FINDINGS Alignment: Mild levocurvature of the cervical spine. Nonspecific reversal of the expected cervical doses. 2 mm C2-C3 grade 1 anterolisthesis. 4 mm C4-C5 grade 1 anterolisthesis. Slight grade 1 anterolisthesis at C5-C6, C6-C7, C7-T1, T1-T2 and T3-T4. Skull base and vertebrae: The basion-dental and atlanto-dental intervals are maintained.No evidence of acute fracture to the cervical spine. Developmental incomplete segmentation of the T2-T3 vertebrae. Subcentimeter sclerotic focus within the left aspect of the T3 vertebral body, nonspecific but likely reflecting a bone island. Soft tissues and spinal canal: No prevertebral fluid or swelling. No visible canal hematoma. Disc levels: Cervical spondylosis with multilevel disc space narrowing, disc bulges/central disc protrusions and overall advanced facet arthropathy. No appreciable  high-grade spinal canal stenosis. Multilevel bony neural foraminal narrowing. Degenerative changes also present at the atlantooccipital articulations (right greater than left) and at the C1-C2 articulation. Upper chest: No consolidation within the imaged lung apices. No visible pneumothorax. Biapical pleuroparenchymal scarring. Emphysema. Aortic atherosclerosis. IMPRESSION: CT head: 1.  No evidence of an acute intracranial abnormality. 2. Large left scalp hematoma. 3. 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 CT cervical spine: 1. No evidence of an acute cervical spine fracture. 2. Nonspecific reversal of the expected cervical lordosis. 3. Grade 1 anterolisthesis at C2-C3, C4-C5, C5-C6, C6-C7, C7-T1, T1-T2 and T3-T4. 4. Cervical spondylosis as described. 5. Aortic Atherosclerosis (ICD10-I70.0) and Emphysema (ICD10-J43.9). Electronically Signed   By: Rockey Childs D.O.   On: 10/21/2023 12:06   MR TIBIA FIBULA LEFT WO CONTRAST Result Date: 10/07/2023 MR TIBIA AND FIBULA WITHOUT IV CONTRAST COMPARISON: None. CLINICAL HISTORY: Soft tissue infection suspected. PULSE SEQUENCES: Ax T1, Ax T2 FS, Sag T1, Sag T2 FS, Cor STIR, Cor T1 without contrast. FINDINGS: Bones: Mild degenerative changes are seen left knee with tricompartmental osteoarthrosis and reactive joint effusion. There is a small Baker's cyst. Otherwise, the bones are unremarkable. There is no marrow edema or evidence of osteomyelitis. Musculotendinous structures: Musculotendinous structures demonstrate no significant myositis or fatty atrophy. No deep collection is present. There is moderate pretibial and lateral subcutaneous edema. There is suggestion of mild more focal collections along the anterior fascia in the mid pretibial region best seen on axial image 29 through 40 of series 14. It is difficult to determine if this could represent a more focal abscess or simply a phlegmonous process. Other  correlation with MRI of the left tib-fib with IV contrast or focused ultrasound of the mid pretibial soft tissues to determine if there is a collection that can be sampled. IMPRESSION: Moderate focal pretibial and lateral subcutaneous edema in the mid left lower extremity. There is a more focal collection along the anterior compartment fascia which is indeterminate. This could reflect an abscess in the appropriate clinical setting. See above for more detail. Focused ultrasound with sampling if clinically indicated. No deep involvement or osseous involvement at the current time. Electronically signed by: Norleen Satchel MD 10/07/2023 11:14 AM EDT RP Workstation: [id number]   DG Chest 2 View Result Date: 10/06/2023 CLINICAL DATA:  Hypoxia EXAM: CHEST - 2 VIEW COMPARISON:  Chest x-ray 02/11/2023 FINDINGS: The heart size and mediastinal contours are within normal limits. Both lungs are clear. The visualized skeletal structures are unremarkable. IMPRESSION: No active cardiopulmonary disease. Electronically Signed   By: Greig Maple HERO.D.  On: 10/06/2023 22:44   DG Tibia/Fibula Left Result Date: 10/06/2023 CLINICAL DATA:  Lower extremity pain.  Potential for osteomyelitis. EXAM: LEFT TIBIA AND FIBULA - 2 VIEW COMPARISON:  Study of 08/10/2023. FINDINGS: AP Lat two-view exam obtained in 4 films, again reveals generalized edema in the mid and distal foreleg beginning in the mid calf continuing inferiorly and greater laterally. In the anterolateral mid foreleg there is a focally more prominent edema with 2 cm subcutaneous air pocket or soft tissue defect not seen previously. There is no radiopaque foreign body. No evidence of fractures or acute osteomyelitis. There is moderate arthrosis at the left knee and left ankle. Enthesopathic changes of the anterior patella. There is a popcorn like calcification again noted in the popliteal fossa which would either represent a calcified popliteal lymph node or osteochondral loose  body such as within the posterior joint space or in a popliteal cyst. There are patchy calcifications in the posterior tibial artery. IMPRESSION: 1. Generalized edema in the mid and distal foreleg, greater laterally. 2. Focally more prominent edema in the anterolateral mid foreleg with 2 cm subcutaneous air pocket or soft tissue defect not seen previously. 3. No evidence of fractures or acute osteomyelitis. 4. Moderate arthrosis at the left knee and left ankle. 5. Calcified popliteal lymph node versus osteochondral loose body in the posterior joint space or in a popliteal cyst. Electronically Signed   By: Francis Quam M.D.   On: 10/06/2023 21:02    Labs: BNP (last 3 results) Recent Labs    10/06/23 1908 10/07/23 0405 10/22/23 0503  BNP 92.7 123.3* 95.2   Basic Metabolic Panel: Recent Labs  Lab 10/25/23 0414 10/26/23 0519 10/27/23 0721 10/28/23 0648 10/31/23 0457  NA 135 137 136 140  --   K 4.0 3.3* 4.0 4.3  --   CL 102 107 105 103  --   CO2 21* 20* 22 24  --   GLUCOSE 168* 187* 112* 140*  --   BUN 17 28* 27* 23  --   CREATININE 1.76* 1.57* 1.44* 1.42* 1.45*  CALCIUM  8.8* 8.5* 8.5* 8.9  --    Liver Function Tests: No results for input(s): AST, ALT, ALKPHOS, BILITOT, PROT, ALBUMIN  in the last 168 hours. No results for input(s): LIPASE, AMYLASE in the last 168 hours. No results for input(s): AMMONIA in the last 168 hours. CBC: Recent Labs  Lab 10/25/23 0414 10/26/23 0519 10/27/23 0721 10/28/23 0648  WBC 13.0* 13.4* 8.7 8.7  NEUTROABS  --   --   --  6.0  HGB 11.3* 10.9* 10.9* 11.1*  HCT 34.4* 33.5* 33.9* 33.8*  MCV 93.0 93.3 95.8 93.4  PLT 272 295 289 338   CBG: Recent Labs  Lab 10/30/23 0744 10/30/23 1210 10/30/23 1643 10/31/23 0745 10/31/23 1153  GLUCAP 111* 147* 96 98 211*   Urinalysis    Component Value Date/Time   COLORURINE YELLOW 10/08/2023 1257   APPEARANCEUR CLEAR 10/08/2023 1257   LABSPEC 1.006 10/08/2023 1257   PHURINE 6.0  10/08/2023 1257   GLUCOSEU >=500 (A) 10/08/2023 1257   GLUCOSEU >=1000 (A) 05/15/2022 1554   HGBUR NEGATIVE 10/08/2023 1257   BILIRUBINUR NEGATIVE 10/08/2023 1257   BILIRUBINUR negative 09/25/2022 1027   KETONESUR NEGATIVE 10/08/2023 1257   PROTEINUR NEGATIVE 10/08/2023 1257   UROBILINOGEN 0.2 09/25/2022 1027   UROBILINOGEN 0.2 05/15/2022 1554   NITRITE NEGATIVE 10/08/2023 1257   LEUKOCYTESUR NEGATIVE 10/08/2023 1257   Sepsis Labs Recent Labs  Lab 10/25/23 0414 10/26/23 0519 10/27/23 9278  10/28/23 0648  WBC 13.0* 13.4* 8.7 8.7   Microbiology Recent Results (from the past 240 hours)  Surgical pcr screen     Status: None   Collection Time: 10/23/23  1:05 AM   Specimen: Nasal Mucosa; Nasal Swab  Result Value Ref Range Status   MRSA, PCR NEGATIVE NEGATIVE Final   Staphylococcus aureus NEGATIVE NEGATIVE Final    Comment: (NOTE) The Xpert SA Assay (FDA approved for NASAL specimens in patients 52 years of age and older), is one component of a comprehensive surveillance program. It is not intended to diagnose infection nor to guide or monitor treatment. Performed at [facility], [address], [place], [name] [address]    Time coordinating discharge: 35 minutes  SIGNED: Mennie LAMY, MD  [facility] 10/31/2023, 1:26 PM  If 7PM-7AM, please contact night-coverage [email or web address]

## 2023-10-31 NOTE — Plan of Care (Signed)

## 2023-10-31 NOTE — Progress Notes (Signed)
 PROGRESS NOTE Kerry Powell  FMW:[id number] DOB: September 21, 1945 DOA: 10/21/2023 PCP: Geofm Glade PARAS, MD  Brief Narrative/Hospital Course:  T2DM CKD 3B chronic HFpEF, COPD-on home oxygen 2 L since last discharge admitted with ground-level fall. Recently admitted and discharged on August 17 after sepsis and right lower extremity cellulitis abscess treatment and was debrided by Dr. Harden.  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 lung showed moderate centrilobular emphysema no pleural effusion or pneumothorax.Orthopedics consulted. Pre-op evaluation done by cardiology and patient underwent left hip IM on 8/28.  Pending SNF placement.  Remains medically stable  Subjective: Seen and examined today Resting comfortably Last bowel movement 8/24 No acute events overnight  Assessment and plan:  Impacted angulated intertrochanteric fracture of left femur, s/p left hip IM surgery done on 8/28. Fall at home, initial encounter: S/P ORIF with IM rod. Cont multimodal pain management, PT OT,DVT prophylaxis w/ Lovenox .  PreOP eval: Cardiology consulted for preoperative clearance. Coronary CTA done on 07/02/19 showed coronary calcium  score of 390 and mild atherosclerosis.   COPD/emphysema Acute on chronic hypoxic resp failure-on 2l [place] x 3 wk: Recently discharged on supplemental oxygen. Will keep her on Pulmicort /Brovana /Yupelri  and bronchodilators as needed, I-S.  Weaned to nasal cannula at 2 L.  ABG obtained no CO2 retention pCO2 61, BNP is 95.   Recent admission for LLE purulent cellulitis/abscess/sepsis: S/p incision/debridement by Dr. Harden on 8/13 and was dced on 8/17 on 3-weeks course of oral  doxycycline . Cont wound care. Stopped doxycyline on 8/31 due to intolerance, and started augmentin .   CAD HLD PAD: CT scan showing large burden of irregular mural thrombus particularly descending thoracic aorta/coronary atherosclerosis: Plavix  on hold.  Dr. Silver from [facility] was curbsided who advised outpatient follow-up and start [facility] once able Patient had preop workup -ECHO showed EF 60-65% and Grade 1 diastolic dysfunction. Lexiscan  stress test done on 8/27 showed intermediate risk. As per cardiology Given the lack of high risk stress test findings, normal ejection fraction, and planned orthopedic surgery which is not high risk from a cardiovascular perspective, would proceed with orthopedic surgery as planned Prior lexiscan  done on 03/09/2022 was unremarkable. Continue on home meds -Plavix , metoprolol   Hypertension: BP stable, continue metoprolol , telmisartan .   Chronic HFpEF: NYHA II. Not in exacerbation Imaging shows emphysema but no pleural effusion or edema.  Continue on Lasix   Net IO Since Admission: -2,972.75 mL [10/31/23 1027]    Anemia of chronic disease: Monitor hemoglobin, resumed home iron supplement   GERD: Continue PPI   Diabetes mellitus type 2 with hyperglycemia: Well-controlled, continue SSI   CKD stage IIIb: B/l creatinine around 1.9-2, currently at 1.4. stable, monitor avoid nephrotoxic agents   Class I Obesity w/ Body mass index is 32.45 kg/m.: Will benefit with PCP follow-up, weight loss,healthy lifestyle and outpatient sleep eval if not done.   Urinary retention: In and out cath.  Constipation: Intensify bowel regimen with scheduled stool softener, MiraLAX  and PRN Dulcolax   Class I Obesity w/ Body mass index is 34.12 kg/m.: Will benefit with PCP follow-up, weight loss,healthy lifestyle and outpatient sleep eval if not done.  Mobility: PT Orders: Active  PT Follow up Rec: Skilled Nursing-Short Term Rehab (<3 Hours/Day)10/30/2023 1055    DVT  prophylaxis: enoxaparin  (LOVENOX ) injection  40 mg Start: 10/25/23 0800 SCDs Start: 10/21/23 1412 Code Status:   Code Status: Full Code Family Communication: plan of care discussed with patient/daughter at bedside. Patient status is: Remains hospitalized because of severity of illness Level of care: Progressive   Dispo: The patient is from: home            Anticipated disposition: Awaiting placement  Objective: Vitals last 24 hrs: Vitals:   10/30/23 2001 10/30/23 2258 10/31/23 0323 10/31/23 0747  BP: 121/62 122/71 (!) 106/57 126/67  Pulse: 100 90  82  Resp: 20 14  18   Temp: 98.6 F (37 C) 98.5 F (36.9 C) 97.9 F (36.6 C) 97.8 F (36.6 C)  TempSrc: Axillary Oral Oral Oral  SpO2: 98% 100%  97%  Weight:      Height:        Physical Examination: General exam: AAOX3 HEENT:Oral mucosa moist, Ear/Nose WNL grossly Respiratory system: B/l clear breath sounds no use of accessory muscles Cardiovascular system: S1 & S2 +, No JVD. Gastrointestinal system: Abdomen soft,NT,ND, BS+ Nervous System: Alert, awake, moving all extremities,and following commands. Extremities: LE edema neg, distal extremities warm.  Skin: No rashes,no icterus. MSK: Left hip surgical site dressing  c/d/i  Medications reviewed:  Scheduled Meds:  amoxicillin -clavulanate  1 tablet Oral Q12H   arformoterol   15 mcg Nebulization BID   budesonide  (PULMICORT ) nebulizer solution  0.25 mg Nebulization BID   clopidogrel   75 mg Oral Daily   docusate sodium   100 mg Oral BID   enoxaparin  (LOVENOX ) injection  40 mg Subcutaneous Q24H   famotidine   20 mg Oral Daily   feeding supplement  237 mL Oral BID BM   ferrous sulfate   325 mg Oral QODAY   furosemide   20 mg Oral QODAY   insulin  aspart  0-15 Units Subcutaneous TID WC   methocarbamol   500 mg Oral QID   montelukast   10 mg Oral QHS   multivitamin  1 tablet Oral QHS   oxyCODONE   10 mg Oral Q12H   pantoprazole   40 mg Oral BID AC   revefenacin   175 mcg Nebulization Daily    rosuvastatin   40 mg Oral Daily   Continuous Infusions:   Diet: Diet Order             Diet regular Room service appropriate? Yes; Fluid consistency: Thin  Diet effective now                    Data Reviewed: I have personally reviewed following labs and imaging studies ( see epic result tab) CBC: Recent Labs  Lab 10/25/23 0414 10/26/23 0519 10/27/23 0721 10/28/23 0648  WBC 13.0* 13.4* 8.7 8.7  NEUTROABS  --   --   --  6.0  HGB 11.3* 10.9* 10.9* 11.1*  HCT 34.4* 33.5* 33.9* 33.8*  MCV 93.0 93.3 95.8 93.4  PLT 272 295 289 338   CMP: Recent Labs  Lab 10/25/23 0414 10/26/23 0519 10/27/23 0721 10/28/23 0648 10/31/23 0457  NA 135 137 136 140  --   K 4.0 3.3* 4.0 4.3  --   CL 102 107 105 103  --   CO2 21* 20* 22 24  --   GLUCOSE 168* 187* 112* 140*  --   BUN 17 28* 27* 23  --   CREATININE 1.76* 1.57* 1.44* 1.42* 1.45*  CALCIUM  8.8* 8.5* 8.5* 8.9  --    GFR: Estimated Creatinine Clearance: 36 mL/min (A) (by C-G formula based on SCr of 1.45 mg/dL (  H)). No results for input(s): AST, ALT, ALKPHOS, BILITOT, PROT, ALBUMIN  in the last 168 hours. No results for input(s): LIPASE, AMYLASE in the last 168 hours. No results for input(s): AMMONIA in the last 168 hours. Coagulation Profile: No results for input(s): INR, PROTIME in the last 168 hours. Unresulted Labs (From admission, onward)     Start     Ordered   10/31/23 0500  Creatinine, serum  (enoxaparin  (LOVENOX )  CrCl >/= 30 mL/min  )  Weekly,   R     Comments: while on enoxaparin  therapy.   Question:  Specimen collection method  Answer:  Lab=Lab collect   10/24/23 1607           Antimicrobials/Microbiology: Anti-infectives (From admission, onward)    Start     Dose/Rate Route Frequency Ordered Stop   10/27/23 1115  amoxicillin -clavulanate (AUGMENTIN ) 875-125 MG per tablet 1 tablet        1 tablet Oral Every 12 hours 10/27/23 1015     10/26/23 2130  doxycycline  (VIBRA -TABS) tablet 100  mg  Status:  Discontinued        100 mg Oral Every 12 hours 10/26/23 1047 10/27/23 0832   10/24/23 1900  ceFAZolin  (ANCEF ) IVPB 2g/100 mL premix        2 g 200 mL/hr over 30 Minutes Intravenous Every 6 hours 10/24/23 1607 10/25/23 0159   10/24/23 1115  ceFAZolin  (ANCEF ) IVPB 2g/100 mL premix        2 g 200 mL/hr over 30 Minutes Intravenous On call to O.R. 10/24/23 1102 10/24/23 1243   10/24/23 1114  ceFAZolin  (ANCEF ) 2-4 GM/100ML-% IVPB       Note to Pharmacy: LORILEE, GRETA: cabinet override      10/24/23 1114 10/24/23 1303   10/22/23 0600  ceFAZolin  (ANCEF ) IVPB 2g/100 mL premix  Status:  Discontinued        2 g 200 mL/hr over 30 Minutes Intravenous On call to O.R. 10/21/23 1404 10/22/23 1228   10/21/23 2200  doxycycline  (VIBRA -TABS) tablet 100 mg  Status:  Discontinued        100 mg Oral Every 12 hours 10/21/23 1915 10/21/23 2044   10/21/23 2200  doxycycline  (VIBRAMYCIN ) 100 mg in sodium chloride  0.9 % 250 mL IVPB  Status:  Discontinued        100 mg 125 mL/hr over 120 Minutes Intravenous Every 12 hours 10/21/23 2044 10/26/23 1047         Component Value Date/Time   SDES BLOOD BLOOD LEFT ARM 10/06/2023 1932   SPECREQUEST  10/06/2023 1932    BOTTLES DRAWN AEROBIC AND ANAEROBIC Blood Culture results may not be optimal due to an inadequate volume of blood received in culture bottles   CULT  10/06/2023 1932    NO GROWTH 5 DAYS Performed at [facility], [address], [place], [name] [address]    REPTSTATUS 10/11/2023 FINAL 10/06/2023 1932    Procedures: Procedure(s) (LRB): FIXATION, FRACTURE, INTERTROCHANTERIC, WITH INTRAMEDULLARY ROD (Left)   Mennie LAMY, MD [facility] 10/31/2023, 10:28 AM

## 2023-10-31 NOTE — Progress Notes (Signed)
 DC order noted per MD. DC RN at bedside with patient/primary RN. Determined patient not a candidate for dc lounge d/t sensitive respiratory status and high fall risk in lieu of recent fall at home with multiple fractures. Patient informed, agreeable with POC of waiting on the floor for PTAR pickup at 1630. Transfer packet prepared with AVS/med necessity.

## 2023-10-31 NOTE — TOC Transition Note (Signed)
 Transition of Care [facility]) - Discharge Note   Patient Details  Name: Kerry Powell MRN: [id number] Date of Birth: [birth year]  Transition of Care [facility]) CM/SW Contact:  Gwenn Julien [place], [name] Phone Number: 10/31/2023, 2:37 PM   Clinical Narrative: CHARON barrows received for [facility] [phone], valid 9/4-9/8). Spoke to Golden at [facility] who confirmed they are prepared to admit pt to room 202. Pt aware of dc and reports agreeable. RN provided with number for report and [facility] arranged for transport. SW signing off at dc.   Julien Gwenn, MSW, LCSW [phone] (coverage)        Final next level of care: Skilled Nursing Facility Barriers to Discharge: Barriers Resolved   Patient Goals and CMS Choice            Discharge Placement              Patient chooses bed at: [facility] Patient to be transferred to facility by: [facility] Name of family member notified: Pt to update family Patient and family notified of of transfer: 10/31/23  Discharge Plan and Services Additional resources added to the After Visit Summary for   In-house Referral: Clinical Social Work   Post Acute Care Choice: Home Health, Resumption of Svcs/PTA Provider                               Social Drivers of Health (SDOH) Interventions SDOH Screenings   Food Insecurity: No Food Insecurity (10/21/2023)  Housing: Low Risk  (10/21/2023)  Transportation Needs: No Transportation Needs (10/21/2023)  Utilities: Not At Risk (10/21/2023)  Alcohol  Screen: Low Risk  (05/15/2023)  Depression (PHQ2-9): Low Risk  (10/14/2023)  Recent Concern: Depression (PHQ2-9) - Medium Risk (07/24/2023)  Financial Resource Strain: Low Risk  (06/04/2023)  Physical Activity: Inactive (06/04/2023)  Social Connections: Socially Isolated (10/21/2023)  Stress: Stress Concern Present (06/04/2023)  Tobacco Use: Medium Risk (10/24/2023)  Health Literacy: Adequate Health Literacy (05/15/2023)     Readmission Risk  Interventions     No data to display

## 2023-10-31 NOTE — Discharge Instructions (Addendum)
 Orthopaedic Trauma Service Discharge Instructions   General Discharge Instructions  Orthopaedic Injuries:  Left hip fracture treated with intramedullary nailing  WEIGHT BEARING STATUS: Weight-bear as tolerated left leg with assistance  RANGE OF MOTION/ACTIVITY: Unrestricted range of motion left hip and knee  Bone health: Fracture indicative of osteoporosis.  Vitamin D  levels look good  Review the following resource for additional information regarding bone health  [email or web address].com.cy  Wound Care: Daily wound care as needed okay to leave incisions open to the air if there is no drainage  Discharge Wound Care Instructions  Do NOT apply any ointments, solutions or lotions to pin sites or surgical wounds.  These prevent needed drainage and even though solutions like hydrogen peroxide kill bacteria, they also damage cells lining the pin sites that help fight infection.  Applying lotions or ointments can keep the wounds moist and can cause them to breakdown and open up as well. This can increase the risk for infection. When in doubt call the office.  Surgical incisions should be dressed daily.  If any drainage is noted, use one layer of adaptic or Mepitel, then gauze and tape.  Alternatively can use a silicone foam dressing such as a Mepilex  NetCamper.cz https://dennis-soto.com/?pd_rd_i=[email or web address]&th=1  http://rojas.com/  These dressing supplies should be available at local medical supply stores ([employer], [facility], etc). They are not usually carried at places like [facility], [facility], [facility], etc  Once the incision is completely dry and without drainage, it may be left open to air out.  Showering may begin 36-48 hours later.  Cleaning gently with soap and  water.   Diet: as you were eating previously.  Can use over the counter stool softeners and bowel preparations, such as Miralax , to help with bowel movements.  Narcotics can be constipating.  Be sure to drink plenty of fluids  PAIN MEDICATION USE AND EXPECTATIONS  You have likely been given narcotic medications to help control your pain.  After a traumatic event that results in an fracture (broken bone) with or without surgery, it is ok to use narcotic pain medications to help control one's pain.  We understand that everyone responds to pain differently and each individual patient will be evaluated on a regular basis for the continued need for narcotic medications. Ideally, narcotic medication use should last no more than 6-8 weeks (coinciding with fracture healing).   As a patient it is your responsibility as well to monitor narcotic medication use and report the amount and frequency you use these medications when you come to your office visit.   We would also advise that if you are using narcotic medications, you should take a dose prior to therapy to maximize you participation.  IF YOU ARE ON NARCOTIC MEDICATIONS IT IS NOT PERMISSIBLE TO OPERATE A MOTOR VEHICLE (MOTORCYCLE/CAR/TRUCK/MOPED) OR HEAVY MACHINERY DO NOT MIX NARCOTICS WITH OTHER CNS (CENTRAL NERVOUS SYSTEM) DEPRESSANTS SUCH AS ALCOHOL    POST-OPERATIVE OPIOID TAPER INSTRUCTIONS: It is important to wean off of your opioid medication as soon as possible. If you do not need pain medication after your surgery it is ok to stop day one. Opioids include: Codeine, Hydrocodone (Norco, Vicodin), Oxycodone (Percocet, oxycontin ) and hydromorphone  amongst others.  Long term and even short term use of opiods can cause: Increased pain response Dependence Constipation Depression Respiratory depression And more.  Withdrawal symptoms can include Flu like symptoms Nausea, vomiting And more Techniques to manage these symptoms Hydrate well Eat  regular healthy meals Stay active Use relaxation techniques(deep breathing, meditating, yoga) Do  Not substitute Alcohol  to help with tapering If you have been on opioids for less than two weeks and do not have pain than it is ok to stop all together.  Plan to wean off of opioids This plan should start within one week post op of your fracture surgery  Maintain the same interval or time between taking each dose and first decrease the dose.  Cut the total daily intake of opioids by one tablet each day Next start to increase the time between doses. The last dose that should be eliminated is the evening dose.    STOP SMOKING OR USING NICOTINE PRODUCTS!!!!  As discussed nicotine severely impairs your body's ability to heal surgical and traumatic wounds but also impairs bone healing.  Wounds and bone heal by forming microscopic blood vessels (angiogenesis) and nicotine is a vasoconstrictor (essentially, shrinks blood vessels).  Therefore, if vasoconstriction occurs to these microscopic blood vessels they essentially disappear and are unable to deliver necessary nutrients to the healing tissue.  This is one modifiable factor that you can do to dramatically increase your chances of healing your injury.    (This means no smoking, no nicotine gum, patches, etc)  DO NOT USE NONSTEROIDAL ANTI-INFLAMMATORY DRUGS (NSAID'S)  Using products such as Advil (ibuprofen), Aleve (naproxen), Motrin (ibuprofen) for additional pain control during fracture healing can delay and/or prevent the healing response.  If you would like to take over the counter (OTC) medication, Tylenol  (acetaminophen ) is ok.  However, some narcotic medications that are given for pain control contain acetaminophen  as well. Therefore, you should not exceed more than 4000 mg of tylenol  in a day if you do not have liver disease.  Also note that there are may OTC medicines, such as cold medicines and allergy medicines that my contain tylenol  as well.   If you have any questions about medications and/or interactions please ask your doctor/PA or your pharmacist.      ICE AND ELEVATE INJURED/OPERATIVE EXTREMITY  Using ice and elevating the injured extremity above your heart can help with swelling and pain control.  Icing in a pulsatile fashion, such as 20 minutes on and 20 minutes off, can be followed.    Do not place ice directly on skin. Make sure there is a barrier between to skin and the ice pack.    Using frozen items such as frozen peas works well as the conform nicely to the are that needs to be iced.  USE AN ACE WRAP OR TED HOSE FOR SWELLING CONTROL  In addition to icing and elevation, Ace wraps or TED hose are used to help limit and resolve swelling.  It is recommended to use Ace wraps or TED hose until you are informed to stop.    When using Ace Wraps start the wrapping distally (farthest away from the body) and wrap proximally (closer to the body)   Example: If you had surgery on your leg and you do not have a splint on, start the ace wrap at the toes and work your way up to the thigh        If you had surgery on your upper extremity and do not have a splint on, start the ace wrap at your fingers and work your way up to the upper arm  IF YOU ARE IN A SPLINT OR CAST DO NOT REMOVE IT FOR ANY REASON   If your splint gets wet for any reason please contact the office immediately. You may shower in your splint  or cast as long as you keep it dry.  This can be done by wrapping in a cast cover or garbage back (or similar)  Do Not stick any thing down your splint or cast such as pencils, money, or hangers to try and scratch yourself with.  If you feel itchy take benadryl  as prescribed on the bottle for itching  IF YOU ARE IN A CAM BOOT (BLACK BOOT)  You may remove boot periodically. Perform daily dressing changes as noted below.  Wash the liner of the boot regularly and wear a sock when wearing the boot. It is recommended that you sleep in the  boot until told otherwise    Call office for the following: Temperature greater than 101F Persistent nausea and vomiting Severe uncontrolled pain Redness, tenderness, or signs of infection (pain, swelling, redness, odor or green/yellow discharge around the site) Difficulty breathing, headache or visual disturbances Hives Persistent dizziness or light-headedness Extreme fatigue Any other questions or concerns you may have after discharge  In an emergency, call 911 or go to an Emergency Department at a nearby hospital  HELPFUL INFORMATION  If you had a block, it will wear off between 8-24 hrs postop typically.  This is period when your pain may go from nearly zero to the pain you would have had postop without the block.  This is an abrupt transition but nothing dangerous is happening.  You may take an extra dose of narcotic when this happens.  You should wean off your narcotic medicines as soon as you are able.  Most patients will be off or using minimal narcotics before their first postop appointment.   We suggest you use the pain medication the first night prior to going to bed, in order to ease any pain when the anesthesia wears off. You should avoid taking pain medications on an empty stomach as it will make you nauseous.  Do not drink alcoholic beverages or take illicit drugs when taking pain medications.  In most states it is against the law to drive while you are in a splint or sling.  And certainly against the law to drive while taking narcotics.  You may return to work/school in the next couple of days when you feel up to it.   Pain medication may make you constipated.  Below are a few solutions to try in this order: Decrease the amount of pain medication if you aren't having pain. Drink lots of decaffeinated fluids. Drink prune juice and/or each dried prunes  If the first 3 don't work start with additional solutions Take Colace - an over-the-counter stool softener Take  Senokot - an over-the-counter laxative Take Miralax  - a stronger over-the-counter laxative     CALL THE OFFICE WITH ANY QUESTIONS OR CONCERNS: [phone]   VISIT OUR WEBSITE FOR ADDITIONAL INFORMATION: [email or web address]

## 2023-10-31 NOTE — Progress Notes (Signed)
 Report given to [facility]. Discharge lounge RN notified.

## 2023-10-31 NOTE — Progress Notes (Signed)
 Orthopaedic Trauma Service Progress Note  Patient ID: Kerry Powell MRN: [id number] DOB/AGE: [birth year] 78 y.o.  Subjective:  Patient reports pain as {DEGREE - MILD, MOD, SEV:20224}.   Patient reports pain as {pain:3041404}.  ***  ***(cosigner if appropriate)   ROS  Today's  total administered Morphine  Milligram Equivalents: 20 Yesterday's total administered Morphine  Milligram Equivalents: 35  Objective:   VITALS:   Vitals:   10/30/23 2001 10/30/23 2258 10/31/23 0323 10/31/23 0747  BP: 121/62 122/71 (!) 106/57 126/67  Pulse: 100 90  82  Resp: 20 14  18   Temp: 98.6 F (37 C) 98.5 F (36.9 C) 97.9 F (36.6 C) 97.8 F (36.6 C)  TempSrc: Axillary Oral Oral Oral  SpO2: 98% 100%  97%  Weight:      Height:        Estimated body mass index is 34.12 kg/m as calculated from the following:   Height as of this encounter: 5' 5 (1.651 m).   Weight as of this encounter: 93 kg.   Intake/Output      09/03 0701 09/04 0700 09/04 0701 09/05 0700   P.O. 120    Total Intake(mL/kg) 120 (1.3)    Net +120         Urine Occurrence 1 x      LABS  Results for orders placed or performed during the hospital encounter of 10/21/23 (from the past 24 hours)  Glucose, capillary     Status: Abnormal   Collection Time: 10/30/23 12:10 PM  Result Value Ref Range   Glucose-Capillary 147 (H) 70 - 99 mg/dL  Glucose, capillary     Status: None   Collection Time: 10/30/23  4:43 PM  Result Value Ref Range   Glucose-Capillary 96 70 - 99 mg/dL  Creatinine, serum     Status: Abnormal   Collection Time: 10/31/23  4:57 AM  Result Value Ref Range   Creatinine, Ser 1.45 (H) 0.44 - 1.00 mg/dL   GFR, Estimated 37 (L) >60 mL/min  Glucose, capillary     Status: None   Collection Time: 10/31/23  7:45 AM  Result Value Ref Range   Glucose-Capillary 98 70 - 99 mg/dL     PHYSICAL EXAM:   ***  Gen:      Orientation:  ***      Mood and Affect: ***      Gait: ***      Coordination and balance: *** Lungs: Cardiac: Abd: Pelvis: Ext:  Ortho Exam  Incision: {CHL IP ORTHO INCISION ZKJF:6958594}  Assessment/Plan: 7 Days Post-Op   Principal Problem:   Fall at home, initial encounter Active Problems:   COPD (chronic obstructive pulmonary disease) (HCC)   History of CVA (cerebrovascular accident)   GERD (gastroesophageal reflux disease)   DM2 (diabetes mellitus, type 2) (HCC)   Anxiety   Hyperlipidemia associated with type 2 diabetes mellitus (HCC)   CAD (coronary artery disease), native coronary artery   PAD (peripheral artery disease) (HCC)   Chronic diastolic CHF (congestive heart failure) (HCC)   Chronic respiratory failure with hypoxia (HCC)   Closed comminuted intertrochanteric fracture of proximal end of left femur (HCC)   Protein-calorie malnutrition, severe   Anti-infectives (From admission, onward)    Start     Dose/Rate Route Frequency Ordered Stop   10/27/23 1115  amoxicillin -clavulanate (AUGMENTIN )  875-125 MG per tablet 1 tablet  Status:  Discontinued        1 tablet Oral Every 12 hours 10/27/23 1015 10/31/23 1121   10/26/23 2130  doxycycline  (VIBRA -TABS) tablet 100 mg  Status:  Discontinued        100 mg Oral Every 12 hours 10/26/23 1047 10/27/23 0832   10/24/23 1900  ceFAZolin  (ANCEF ) IVPB 2g/100 mL premix        2 g 200 mL/hr over 30 Minutes Intravenous Every 6 hours 10/24/23 1607 10/25/23 0159   10/24/23 1115  ceFAZolin  (ANCEF ) IVPB 2g/100 mL premix        2 g 200 mL/hr over 30 Minutes Intravenous On call to O.R. 10/24/23 1102 10/24/23 1243   10/24/23 1114  ceFAZolin  (ANCEF ) 2-4 GM/100ML-% IVPB       Note to Pharmacy: LORILEE, GRETA: cabinet override      10/24/23 1114 10/24/23 1303   10/22/23 0600  ceFAZolin  (ANCEF ) IVPB 2g/100 mL premix  Status:  Discontinued        2 g 200 mL/hr over 30 Minutes Intravenous On call to O.R. 10/21/23 1404 10/22/23 1228   10/21/23 2200   doxycycline  (VIBRA -TABS) tablet 100 mg  Status:  Discontinued        100 mg Oral Every 12 hours 10/21/23 1915 10/21/23 2044   10/21/23 2200  doxycycline  (VIBRAMYCIN ) 100 mg in sodium chloride  0.9 % 250 mL IVPB  Status:  Discontinued        100 mg 125 mL/hr over 120 Minutes Intravenous Every 12 hours 10/21/23 2044 10/26/23 1047     .  POD/HD#:    -(ortho injury): Weightbearing ***   ROM/Activity   ***   Wound care   ***  - Pain management:  *** - ABL anemia/Hemodynamics  *** - Medical issues   *** - DVT/PE prophylaxis:  *** - ID:   *** - Metabolic Bone Disease:  *** - Activity:  *** - FEN/GI prophylaxis/Foley/Lines:  *** -Ex-fix/Splint care:  *** - Impediments to fracture healing:  *** - Dispo:  ***  {ORTHOADMISSIONSTATUS:21269}   Weightbearing: {weightbearing/status:21254} {affected extremity :21255} ROM: Insicional and dressing care: {Insicional and dressing care:21256} Pain management: *** Foley/lines: *** ID:*** Impediments to Fracture Healing: *** Bone Health/Optimization: ***  Orthopedic device(s): {CHL orthopedic devices:21267::None}  Showering: ***  VTE prophylaxis: {Type:21257} {Duration:21258}  Dispo: *** D/C recommendations:  Follow - up plan: {Follow-up plan:21259}  Contact information:  *** MD, *** PA   Francis MICAEL Mt, PA-C [phone] (C) 10/31/2023, 11:24 AM  [facility] [address] [name] [address] [phone] [phone] (F)    After 5pm and on the weekends please log on to Amion, go to orthopaedics and the look under the [facility] Call for the provider(s) on call. You can also call our office at [phone] and then follow the prompts to be connected to the call team.  Patient ID: Kerry Powell, female   DOB: [birth year], 78 y.o.   MRN: [id number]

## 2023-11-01 ENCOUNTER — Other Ambulatory Visit: Payer: Self-pay

## 2023-11-01 DIAGNOSIS — I712 Thoracic aortic aneurysm, without rupture, unspecified: Secondary | ICD-10-CM

## 2023-11-01 NOTE — Op Note (Signed)
 PATIENT:  Kerry Powell  [birth year] female   MEDICAL RECORD NUMBER: [id number]  PRE-OPERATIVE DIAGNOSIS:  LEFT INTERTROCHANTERIC HIP FRACTURE  POST-OPERATIVE DIAGNOSIS:  LEFT INTERTROCHANTERIC HIP FRACTURE  PROCEDURE:  INTRAMEDULLARY NAILING OF THE LEFT HIP using a Biomet Affixus nail 11 mm diameter locked short.  SURGEON:  Ozell DEL. Celena, M.D.  ASSISTANT:  Francis Mt, PA-C.  ANESTHESIA:  General.  COMPLICATIONS:  None.  ESTIMATED BLOOD LOSS:  Less than 150 mL.  DISPOSITION:  To PACU.  CONDITION:  Stable.  DELAY START OF DVT PROPHYLAXIS BECAUSE OF BLEEDING RISK: NO  BRIEF SUMMARY AND INDICATION OF PROCEDURE:  Kerry Powell is a 78 y.o. year- old with multiple medical problems, which delayed her initial effort to go to the operating room with Dr. Vernetta. At his direction and because of scheduling I have now assumed management. I discussed with the patient and family risks and benefits of surgical treatment including the potential for malunion, nonunion, symptomatic hardware, heart attack, stroke, neurovascular injury, bleeding, and others.  After acknowledgement of these risks, consent was provided to proceed.  BRIEF SUMMARY OF PROCEDURE:  The patient was taken to the operating room where general anesthesia was induced.  She was positioned supine on the Hana fracture table.  A closed reduction maneuver was performed of the fractured proximal femur and this was confirmed on both AP and lateral xray views. A thorough scrub and wash with chlorhexidine  and then Betadine  scrub and paint was performed.  After sterile drapes and time-out, a long instrument was used to identify the appropriate starting position under C-arm on both AP and lateral images.  A 3 cm incision was made proximal to the greater trochanter.  The curved cannulated awl was inserted just medial to the tip of the lateral trochanter and then the starting guidewire advanced into the proximal femur.  This was checked on AP  and lateral views.  The starting reamer was engaged with the soft tissue protected by a sleeve.  The 11 mm short nail was then inserted to the appropriate depth.  The guidewire for the lag screw was then inserted with the appropriate anteversion to make sure it was in a center-center position. An additional pin was placed proximally to prevent rotation.  The lag screw pin was measured and the lag screw placed with excellent purchase and position checked on both views. The set screw was then engaged within the groove of the lag screw, which was allowed to telescope.  Traction was released and compression achieved with the Compression device over the lag screw.  This was followed by placement of one distal locking screw using the jig.  This was confirmed on AP and lateral images. Wounds were irrigated thoroughly, closed in a standard layered fashion. Sterile gently compressive dressings were applied.  Francis Mt, PA-C assisted throughout.  The patient was awakened from anesthesia and transported to the PACU in stable condition.  PROGNOSIS:  The patient will be weightbearing as tolerated with physical therapy beginning DVT prophylaxis as soon as deemed stable by the Primary Care Service.  He has no range of motion precautions.  We will continue to follow while in the hospital.  Anticipate follow up in the office in 2 weeks for removal of sutures and further evaluation.     Ozell DEL. Celena, M.D.

## 2023-11-03 ENCOUNTER — Other Ambulatory Visit: Payer: Self-pay | Admitting: Internal Medicine

## 2023-11-06 ENCOUNTER — Encounter: Payer: Self-pay | Admitting: Family

## 2023-11-06 ENCOUNTER — Ambulatory Visit: Admitting: Family

## 2023-11-06 DIAGNOSIS — L02416 Cutaneous abscess of left lower limb: Secondary | ICD-10-CM

## 2023-11-06 NOTE — Progress Notes (Signed)
 Post-Op Visit Note   Patient: Kerry Powell           Date of Birth: Nov 12, 1945           MRN: [id number] Visit Date: 11/06/2023 PCP: Geofm Glade PARAS, MD  Chief Complaint:  Chief Complaint  Patient presents with   Left Leg - Routine Post Op    10/09/2023 I&D Left leg abscess    HPI:  HPI The patient is a 78 year old woman who is seen status post irrigation debridement of an abscess to the left lower leg August 13 she has been residing at skilled nursing she has not been weightbearing due to her injuries to the left hip and foot.  She is following with Dr. Celena for these injuries Ortho Exam On examination left calf her incision is well-healed there is no erythema warmth or drainage sutures are in place  Visit Diagnoses: No diagnosis found.  Plan: Sutures harvested without incident she we will advance her activities as instructed by Dr. Celena she will follow-up in the office with us  as needed  Follow-Up Instructions: No follow-ups on file.   Imaging: No results found.  Orders:  No orders of the defined types were placed in this encounter.  No orders of the defined types were placed in this encounter.    PMFS History: Patient Active Problem List   Diagnosis Date Noted   Protein-calorie malnutrition, severe 10/31/2023   Fall at home, initial encounter 10/21/2023   Closed comminuted intertrochanteric fracture of proximal end of left femur (HCC) 10/21/2023   Chronic respiratory failure with hypoxia (HCC) 10/16/2023   Severe sepsis (HCC) 10/07/2023   Acute hyponatremia 10/07/2023   Sterile pyuria 10/07/2023   Abscess of left lower leg 10/07/2023   Cellulitis of left lower extremity 10/06/2023   Hematoma of left lower leg 08/26/2023   Hoarseness, chronic 06/05/2023   Hypoxia 03/06/2023   Fall 02/11/2023   Leukocytosis 02/11/2023   History of COPD 02/11/2023   CKD (chronic kidney disease) stage 4, GFR 15-29 ml/min (HCC) 02/11/2023   Chronic diastolic CHF (congestive  heart failure) (HCC) 02/11/2023   Multiple fractures of ribs, bilateral, initial encounter for closed fracture 02/11/2023   Rib fractures 02/10/2023   PAD (peripheral artery disease) (HCC) 05/15/2022   Syncope and collapse 02/01/2022   Peroneal tendinitis 12/04/2021   Pain in joint of left shoulder 11/16/2021   Facial droop 07/26/2021   Osteoarthritis of knees, bilateral 05/04/2021   Pain in joint of right knee 05/04/2021   Osteoarthritis of left knee 05/03/2021   COPD with acute exacerbation (HCC) 04/18/2021   Acute hypoxic respiratory failure (HCC) 09/26/2020   B12 deficiency 08/03/2020   Diabetic polyneuropathy associated with type 2 diabetes mellitus (HCC) 07/29/2020   Diastolic dysfunction without heart failure 01/20/2020   Pain of left thumb 12/24/2019   Pain of right thumb 12/24/2019   Polyarthralgia 12/07/2019   Myalgia 12/07/2019   Anemia 12/07/2019   Fatigue 12/07/2019   Acquired thrombophilia (HCC) 07/14/2019   CAD (coronary artery disease), native coronary artery    Cervical spine pain 06/16/2019   Pleural plaque 04/27/2019   Spasm of thoracic back muscle 11/14/2018   SOB (shortness of breath) 04/17/2018   Vitamin D  deficiency 03/12/2018   Thoracic aortic aneurysm (HCC) 02/06/2018   Pain in left knee 01/14/2018   Sleep difficulties 09/09/2017   Depression 07/01/2017   Dizziness 06/10/2017   H/O renal cell carcinoma 05/23/2017   History of kidney cancer 05/23/2017   Cervical radiculopathy  10/31/2016   Hyperlipidemia associated with type 2 diabetes mellitus (HCC) 06/25/2016   Occipital neuralgia 05/09/2016   Anxiety 07/26/2015   Chronic neck pain 04/30/2015   DM2 (diabetes mellitus, type 2) (HCC) 04/27/2015   History of CVA (cerebrovascular accident) 04/26/2015   Gout 04/26/2015   Hypertension associated with type 2 diabetes mellitus (HCC) 04/26/2015   Snoring 04/26/2015   Bilateral leg edema 04/26/2015   Dysphagia 04/26/2015   GERD (gastroesophageal reflux  disease) 04/26/2015   Localized edema 03/20/2015   Tobacco dependence syndrome 12/17/2007   History of colonic polyps 05/29/2007   HIATAL HERNIA 04/07/2007   Bipolar 1 disorder (HCC) 04/04/2007   RESTLESS LEG SYNDROME 04/04/2007   RHINOSINUSITIS, 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   COPD (chronic obstructive pulmonary disease) (HCC) 04/04/2007   IRRITABLE BOWEL SYNDROME 04/04/2007   Osteoarthritis, diffuse 04/04/2007   Past Medical History:  Diagnosis Date   Allergy    Anemia    Anxiety    Arthritis    BACK PAIN, CHRONIC 04/04/2007   Qualifier: Diagnosis of  By: [name], [name]    BIPOLAR AFFECTIVE DISORDER 04/04/2007   Qualifier: History of  By: [name], [name] E    CAD (coronary artery disease), native coronary artery    mild CAD with no obstructive disease by coronary CTA 06/2019   Cataract    removed both eyes   CHF (congestive heart failure) (HCC)    Chronic neck pain 04/30/2015   Cervical spondylosis [facility] Intermittent numbness and tingling in occipital region and headaches S/p 2 occipital nerve blocks - only temp relief no relief with gabapentin, ultram , brace MRI neck - facet arthrosis    CKD (chronic kidney disease) 04/26/2015   COPD (chronic obstructive pulmonary disease) (HCC)    pt denies - told this while was smoking -   Cough 04/26/2015   Depression 07/01/2017   Diabetes (HCC) 04/27/2015   Diagnosed 03/2015    Dizziness 06/10/2017   Dysphagia 04/26/2015   DYSPNEA 12/24/2007   Qualifier: Diagnosis of  By: [name]     Elevated TSH 08/09/2015   GASTRIC ULCER, ACUTE 04/07/2007   Annotation: EGD 04/07/07 Qualifier: Diagnosis of  By: [name], [name] E    GASTRITIS 04/07/2007   Annotation: EGD 04/07/07 Qualifier: Diagnosis of  By: [name], [name] E    GERD (gastroesophageal reflux disease)    H/O renal cell carcinoma 05/23/2017   Dx 2010, s/p cryoablation Follows with urology - Dr [name]   HIATAL HERNIA 04/07/2007    Annotation: EGD 04/07/07 Qualifier: Diagnosis of  By: [name], [name] E    History of CVA (cerebrovascular accident) 04/26/2015   History of gout 04/26/2015   Hyperlipidemia    Hypertension    Hypoxia 03/06/2023   Irritable bowel syndrome 04/04/2007   Qualifier: Diagnosis of  By: [name], [name]    Kidney tumor (benign)    Nausea and vomiting 09/09/2017   Occipital neuralgia 05/09/2016   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 Dr [name] in [place]   OSTEOARTHRITIS 04/04/2007   Qualifier: Diagnosis of  By: [name], [name]    Personal history of colonic adenoma 06/07/2012   POSTNASAL DRIP SYNDROME 12/29/2007   Qualifier: Diagnosis of  By: [name], [name]     RESTLESS LEG SYNDROME 04/04/2007   Qualifier: Diagnosis of  By: [name], [name]    RHINOSINUSITIS, 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   Qualifier: Diagnosis of  By: [name], [name]  E    Sleep difficulties 09/09/2017   Snoring 04/26/2015   Stroke [facility])    x2 2011   Thoracic aortic aneurysm (HCC) 02/06/2018   Seen on MRI 01/25/18 of thoracic spine ordered by [facility] -- referred to CTS to monitor  -- 3.5 cm   TIA (transient ischemic attack)    TOBACCO ABUSE 12/17/2007   Qualifier: Diagnosis of  By: [name], [name]     Trigeminal neuralgia of right side of face 02/08/2016   URI (upper respiratory infection) 02/08/2016   Vitamin D  deficiency 03/12/2018    Family History  Problem Relation Age of Onset   Congestive Heart Failure Mother    Cancer Mother        ovarian   Asthma Mother    Heart disease Mother    Heart disease Father    Diabetes Father    Kidney disease Father    Hyperlipidemia Father    Stroke Father    Diabetes Sister    Kidney disease Sister    Miscarriages / [place] Sister    Arthritis Maternal Grandmother    Hyperlipidemia Maternal Grandmother    Hypertension Maternal Grandmother    Obesity Maternal Grandmother    Arthritis Paternal Grandfather    Hypertension  Paternal Grandfather    Kidney disease Paternal Grandfather    Diabetes Paternal Aunt    Diabetes Paternal Uncle    Learning disabilities Brother    Anxiety disorder Son    Diabetes Son    Learning disabilities Son    Depression Maternal Aunt    Colon cancer Neg Hx    Colon polyps Neg Hx    Esophageal cancer Neg Hx    Rectal cancer Neg Hx    Stomach cancer Neg Hx     Past Surgical History:  Procedure Laterality Date   ABDOMINAL HYSTERECTOMY     APPENDECTOMY     APPLICATION OF WOUND VAC Left 10/09/2023   Procedure: APPLICATION, WOUND VAC;  Surgeon: [name], MD;  Location: [facility] OR;  Service: Orthopedics;  Laterality: Left;   CATARACT EXTRACTION     COLONOSCOPY     CYSTOCELE REPAIR     ESOPHAGOGASTRODUODENOSCOPY     FRACTURE SURGERY  2007   INCISION AND DRAINAGE OF DEEP ABSCESS, CALF Left 10/09/2023   Procedure: INCISION AND DRAINAGE OF DEEP ABSCESS, CALF;  Surgeon: [name], MD;  Location: [facility] OR;  Service: Orthopedics;  Laterality: Left;   INTRAMEDULLARY (IM) NAIL INTERTROCHANTERIC Left 10/24/2023   Procedure: FIXATION, FRACTURE, INTERTROCHANTERIC, WITH INTRAMEDULLARY ROD;  Surgeon: [name], MD;  Location: [facility] OR;  Service: Orthopedics;  Laterality: Left;   KIDNEY SURGERY     benign tumor removal   ORTHOPEDIC SURGERY     Wrist & elbow   POLYPECTOMY     RECTOCELE REPAIR     TONSILECTOMY, ADENOIDECTOMY, BILATERAL MYRINGOTOMY AND TUBES Bilateral 1968   UPPER GASTROINTESTINAL ENDOSCOPY     Social History   Occupational History   Occupation: Retired  Tobacco Use   Smoking status: Former    Current packs/day: 0.00    Average packs/day: 1 pack/day for 46.0 years (46.0 ttl pk-yrs)    Types: Cigarettes    Start date: 07/01/1963    Quit date: 06/30/2009    Years since quitting: 14.3    Passive exposure: Past   Smokeless tobacco: Never  Vaping Use   Vaping status: Never Used  Substance and Sexual Activity   Alcohol  use: No   Drug use: No  Sexual activity: Not  Currently

## 2023-11-13 NOTE — Progress Notes (Deleted)
 Cardiology Office Note:  .   Date:  11/13/2023  ID:  [name], DOB 02/23/1946, MRN [id number] PCP: Geofm Glade PARAS, MD  [facility] Providers Cardiologist:  Wilbert Bihari, MD { Click to update primary MD,subspecialty MD or APP then REFRESH:1}   History of Present Illness: .   Kerry Powell is a 78 y.o. female   with a hx of thoracic aortic aneurysm, CVA, PVD, type 2 diabetes, COPD, hyperlipidemia, coronary artery disease noted on CCTA, hypertension, chronic HFpEF, CKD stage 3b, atrial tachycardia, and bipolar disorder  who is being seen 10/22/2023 for the evaluation of preoperative evaluation after a ground level fall. Lexiscan  was intermediate risk and she was cleared for hip surgery. BP ran low and BP meds held.  Patient at [facility] rehab. On florinef  for orthostatic hypotension  ROS: ***  Studies Reviewed: SABRA         Prior CV Studies:    NST 10/23/23 IMPRESSION: 1. Significant motion degradation of imaging. Potential reversible ischemia within the inferolateral wall.   2. Normal left ventricular wall motion.   3. Left ventricular ejection fraction greater than 70%   4. Non invasive risk stratification*: Intermediate  Echo 10/22/23 IMPRESSIONS     1. Left ventricular ejection fraction, by estimation, is 60 to 65%. The  left ventricle has normal function. Left ventricular endocardial border  not optimally defined to evaluate regional wall motion. There is mild  concentric left ventricular  hypertrophy. Left ventricular diastolic parameters are consistent with  Grade I diastolic dysfunction (impaired relaxation).   2. Right ventricular systolic function is normal. The right ventricular  size is normal. There is normal pulmonary artery systolic pressure. The  estimated right ventricular systolic pressure is 21.8 mmHg.   3. Left atrial size was mildly dilated.   4. The mitral valve is normal in structure. No evidence of mitral valve  regurgitation. No evidence of  mitral stenosis.   5. The aortic valve is tricuspid. There is mild calcification of the  aortic valve. Aortic valve regurgitation is not visualized. No aortic  stenosis is present.   6. The inferior vena cava is normal in size with greater than 50%  respiratory variability, suggesting right atrial pressure of 3 mmHg.   7. Technically difficult study with poor acoustic windows.     CT scan showing large burden of irregular mural thrombus particularly descending thoracic aorta/coronary atherosclerosis: Plavix  on hold.  Dr. Silver from [facility] was curbsided who advised outpatient follow-up and start [facility] once able Patient had preop workup -ECHO showed EF 60-65% and Grade 1 diastolic dysfunction. Lexiscan  stress test done on 8/27 showed intermediate risk. As per cardiology Given the lack of high risk stress test findings, normal ejection fraction, and planned orthopedic surgery which is not high risk from a cardiovascular perspective, would proceed with orthopedic surgery as planned Prior lexiscan  done on 03/09/2022 was unremarkable. Continue on home meds -resume Plavix ,crestor .        Risk Assessment/Calculations:   {Does this patient have ATRIAL FIBRILLATION?:[phone]} No BP recorded.  {Refresh Note OR Click here to enter BP  :1}***       Physical Exam:   VS:  There were no vitals taken for this visit.   Orhtostatics: No data found. Wt Readings from Last 3 Encounters:  10/30/23 205 lb 0.4 oz (93 kg)  10/13/23 195 lb 11.2 oz (88.8 kg)  08/10/23 226 lb 3.1 oz (102.6 kg)    GEN: Well nourished, well developed in no acute  distress NECK: No JVD; No carotid bruits CARDIAC: ***RRR, no murmurs, rubs, gallops RESPIRATORY:  Clear to auscultation without rales, wheezing or rhonchi  ABDOMEN: Soft, non-tender, non-distended EXTREMITIES:  No edema; No deformity   ASSESSMENT AND PLAN: .   CAD -CT scan showing large burden of irregular mural thrombus particularly descending thoracic aorta/coronary  atherosclerosis: Plavix  on hold.  Dr. Silver from [facility] was curbsided who advised outpatient follow-up and start [facility] once able Patient had preop workup -ECHO showed EF 60-65% and Grade 1 diastolic dysfunction. Lexiscan  stress test done on 8/27 showed intermediate risk. As per cardiology Given the lack of high risk stress test findings, normal ejection fraction, and planned orthopedic surgery which is not high risk from a cardiovascular perspective, would proceed with orthopedic surgery as planned Prior lexiscan  done on 03/09/2022 was unremarkable. Continue on home meds -resume Plavix ,crestor .  CT scan showing large burden of irregular mural thrombus particularly descending thoracic aorta/coronary atherosclerosis: Plavix  on hold.  Dr. Silver from [facility] was curbsided who advised outpatient follow-up and start [facility] once able   Hypertension: BP stable. Has not needed amlodipine , telmisartan , and metoprolol  inpatient will discontinue.   Chronic HFpEF: NYHA II. Not in exacerbation Imaging shows emphysema but no pleural effusion or edema.  Continue on Lasix   Net IO Since Admission: -2,972.75 mL [10/31/23 1121]        {Are you ordering a CV Procedure (e.g. stress test, cath, DCCV, TEE, etc)?   Press F2        :[id number]}  Dispo: ***  Signed, [name], PA-C

## 2023-11-14 ENCOUNTER — Telehealth: Payer: Self-pay

## 2023-11-14 NOTE — Telephone Encounter (Signed)
 Call to patient to see if orthostatic blood pressure readings are available. Patient states she is still at [facility] but is being discharged Saturday. She reports she will bring information, including blood pressure readings, to her appt 11/19/23 with [name] PA-C.

## 2023-11-14 NOTE — Telephone Encounter (Signed)
 Patient is currently on 0.1 mg florinef for positive orthostatics. Burnard states she will send orthostatic blood pressure readings home with patient so patient can bring them to 11/19/23 visit.

## 2023-11-15 ENCOUNTER — Telehealth: Payer: Self-pay

## 2023-11-15 DIAGNOSIS — I5032 Chronic diastolic (congestive) heart failure: Secondary | ICD-10-CM

## 2023-11-15 DIAGNOSIS — J449 Chronic obstructive pulmonary disease, unspecified: Secondary | ICD-10-CM

## 2023-11-15 NOTE — Telephone Encounter (Signed)
 Copied from CRM [id number]. Topic: Clinical - Medical Advice >> Nov 15, 2023 12:14 PM Aisha D wrote:  Reason for CRM: Pt is requesting to speak with Dr.Burns or her nurse in regards to a medication and medical advice. Pt would like a callback today if possible.

## 2023-11-18 ENCOUNTER — Telehealth: Payer: Self-pay

## 2023-11-18 NOTE — Transitions of Care (Post Inpatient/ED Visit) (Signed)
 11/18/2023  Name: Kerry Powell MRN: [id number] DOB: 1945/11/17  Today's TOC FU Call Status: Today's TOC FU Call Status:: Successful TOC FU Call Completed TOC FU Call Complete Date: 11/18/23 Patient's Name and Date of Birth confirmed.  Transition Care Management Follow-up Telephone Call Date of Discharge: 11/16/23 Discharge Facility: Other [facility]) Name of Other (Non[facility]) Discharge Facility: [facility] Type of Discharge: Inpatient Admission Primary Inpatient Discharge Diagnosis:: Left Hip Fracture How have you been since you were released from the hospital?: Better Any questions or concerns?: No  Items Reviewed: Did you receive and understand the discharge instructions provided?: Yes Medications obtained,verified, and reconciled?: Yes (Medications Reviewed) Any new allergies since your discharge?: No Dietary orders reviewed?: NA Do you have support at home?: Yes People in Home [RPT]: child(ren), adult  Medications Reviewed Today: Medications Reviewed Today     Reviewed by Lavelle Charmaine KATHEE, LPN (Licensed Practical Nurse) on 11/18/23 at 1252  Med List Status: <None>   Medication Order Taking? Sig Documenting Provider Last Dose Status Informant  albuterol  (PROVENTIL ) (2.5 MG/3ML) 0.083% nebulizer solution [id number] Yes USE 1 VIAL IN NEBULIZER EVERY 6 HOURS AND AS NEEDED FOR SHORT BREATH/WHEEZE.  Generic:  Ventolin  Desai, Nikita S, MD  Active Self, Pharmacy Records, Other  allopurinol  (ZYLOPRIM ) 300 MG tablet [id number] Yes Take 1 tablet by mouth once daily Burns, Glade PARAS, MD  Active Self, Pharmacy Records, Other  bisacodyl  (DULCOLAX) 10 MG suppository [id number] Yes Place 1 suppository (10 mg total) rectally daily as needed for moderate constipation. Christobal Guadalajara, MD  Active   budesonide -formoterol  (SYMBICORT ) 160-4.5 MCG/ACT inhaler [id number] Yes Inhale 2 puffs into the lungs 2 (two) times daily. Raenelle Donalda HERO, MD  Active Self, Pharmacy Records, Other   cetirizine  (ZYRTEC  ALLERGY) 10 MG tablet [id number] Yes Take 10 mg by mouth daily as needed for allergies or rhinitis. [provider]  Active Self, Pharmacy Records, Other           Med Note DINO, [facility]   Sun Oct 06, 2023  8:30 PM)    cholecalciferol (VITAMIN D3) 25 MCG (1000 UNIT) tablet [id number] Yes Take 2,000 Units by mouth daily. [provider]  Active Self, Pharmacy Records, Other  clopidogrel  (PLAVIX ) 75 MG tablet [id number] Yes Take 1 tablet by mouth once daily Burns, Glade PARAS, MD  Active Self, Pharmacy Records, Other  cyclobenzaprine  (FLEXERIL ) 5 MG tablet [id number] Yes Take 1 tablet (5 mg total) by mouth 3 (three) times daily as needed. for muscle spams Burns, Glade PARAS, MD  Active Self, Pharmacy Records, Other  DULoxetine  (CYMBALTA ) 30 MG capsule [id number] Yes Take 2 capsules (60 mg total) by mouth daily. Geofm Glade PARAS, MD  Active Self, Pharmacy Records, Other           Med Note ROZENA, [place]   Dju Oct 26, 2023  8:42 PM) Patient has been unable to find medication at home, has been without for several weeks  FARXIGA  10 MG TABS tablet [id number] Yes Take 1 tablet (10 mg total) by mouth daily. Take 10 mg by mouth daily. Geofm Glade PARAS, MD  Active Self, Pharmacy Records, Other  ferrous sulfate  325 (65 FE) MG tablet [id number] Yes Take 325 mg by mouth every other day. [provider]  Active Self, Pharmacy Records, Other  fluticasone  (FLONASE ) 50 MCG/ACT nasal spray [id number] Yes Place 2 sprays into both nostrils daily.  Patient taking differently: Place 2 sprays into both nostrils daily as needed for allergies.   Soldatova, [place], MD  Active Self, Pharmacy Records, Other  furosemide  (LASIX ) 20 MG tablet [id number] Yes Take 1 tablet by mouth once daily Burns, Glade PARAS, MD  Active   methocarbamol  (ROBAXIN ) 500 MG tablet [id number] Yes Take 1 tablet (500 mg total) by mouth every 6 (six) hours as needed for muscle spasms. Christobal Guadalajara, MD  Active   montelukast   (SINGULAIR ) 10 MG tablet [id number] Yes TAKE 1 TABLET BY MOUTH ONCE DAILY AT BEDTIME Geofm Glade PARAS, MD  Active Self, Pharmacy Records, Other  MOUNJARO  5 MG/0.5ML Pen [id number] Yes INJECT 1/2 (ONE-HALF) ML SUBCUTANEOUSLY  ONCE A WEEK  Patient taking differently: Inject 5 mg into the skin once a week. Sundays   Geofm Glade PARAS, MD  Active Self, Pharmacy Records, Other  oxyCODONE  (OXY IR/ROXICODONE ) 5 MG immediate release tablet [id number] Yes Take 1 tablet (5 mg total) by mouth every 6 (six) hours as needed for up to 4 doses for moderate pain (pain score 4-6). Christobal Guadalajara, MD  Active   pantoprazole  (PROTONIX ) 40 MG tablet [id number] Yes Take 1 tablet (40 mg total) by mouth 2 (two) times daily before a meal. Burns, Glade PARAS, MD  Active Self, Pharmacy Records, Other  polyethylene glycol (MIRALAX  / GLYCOLAX ) 17 g packet [id number] Yes Take 17 g by mouth 2 (two) times daily. Christobal Guadalajara, MD  Active   rosuvastatin  (CRESTOR ) 40 MG tablet [id number] Yes Take 1 tablet by mouth once daily Turner, Wilbert SAUNDERS, MD  Active Self, Pharmacy Records, Other  sodium chloride  (OCEAN) 0.65 % SOLN nasal spray [id number] Yes Place 1 spray into both nostrils as needed for congestion. Desai, Nikita S, MD  Active Self, Pharmacy Records, Other  tiotropium (SPIRIVA  HANDIHALER) 18 MCG inhalation capsule [id number] Yes Place 1 capsule (18 mcg total) into inhaler and inhale daily. Ghimire, Donalda HERO, MD  Active Self, Pharmacy Records, Other            Home Care and Equipment/Supplies: Were Home Health Services Ordered?: Yes Any new equipment or medical supplies ordered?: Yes Were you able to get the equipment/medical supplies?: Yes Do you have any questions related to the use of the equipment/supplies?: No  Functional Questionnaire: Do you need assistance with bathing/showering or dressing?: Yes Do you need assistance with meal preparation?: Yes Do you need assistance with eating?: No Do you have difficulty maintaining continence:  No Do you need assistance with getting out of bed/getting out of a chair/moving?: Yes Do you have difficulty managing or taking your medications?: Yes  Follow up appointments reviewed: PCP Follow-up appointment confirmed?: Yes Date of PCP follow-up appointment?: 11/22/23 Follow-up Provider: Dr. Glade Geofm Specialist [facility] Follow-up appointment confirmed?: Yes Follow-Up Specialty Provider:: Orthopedics; Cardiology Do you need transportation to your follow-up appointment?: No Do you understand care options if your condition(s) worsen?: Yes-patient verbalized understanding    SIGNATURE Charmaine Bloodgood, LPN [facility] Health Advisor [facility] You Are. We Are. One [facility] Direct Dial [phone]

## 2023-11-19 ENCOUNTER — Ambulatory Visit: Admitting: Physician Assistant

## 2023-11-20 ENCOUNTER — Telehealth: Payer: Self-pay

## 2023-11-20 ENCOUNTER — Inpatient Hospital Stay: Admitting: Internal Medicine

## 2023-11-20 DIAGNOSIS — I5032 Chronic diastolic (congestive) heart failure: Secondary | ICD-10-CM

## 2023-11-20 DIAGNOSIS — E1169 Type 2 diabetes mellitus with other specified complication: Secondary | ICD-10-CM

## 2023-11-20 DIAGNOSIS — J449 Chronic obstructive pulmonary disease, unspecified: Secondary | ICD-10-CM

## 2023-11-20 NOTE — Patient Outreach (Signed)
 Complex Care Management   Visit Note  11/22/2023  Name:  Kerry Powell MRN: [id number] DOB: [birth year]  Situation: Referral received for Complex Care Management related to  help getting O2 tank(patient reports this is no longer an issue. Discharged from [facility] last week- post Left hip fracture repair I obtained verbal consent from Patient.  Visit completed with Patient  on the phone  Background:   Past Medical History:  Diagnosis Date   Allergy    Anemia    Anxiety    Arthritis    BACK PAIN, CHRONIC 04/04/2007   Qualifier: Diagnosis of  By: [name], [name]    BIPOLAR AFFECTIVE DISORDER 04/04/2007   Qualifier: History of  By: [name], [name] E    CAD (coronary artery disease), native coronary artery    mild CAD with no obstructive disease by coronary CTA 06/2019   Cataract    removed both eyes   CHF (congestive heart failure) (HCC)    Chronic neck pain 04/30/2015   Cervical spondylosis [facility] neurosurgery Intermittent numbness and tingling in occipital region and headaches S/p 2 occipital nerve blocks - only temp relief no relief with gabapentin, ultram , brace MRI neck - facet arthrosis    CKD (chronic kidney disease) 04/26/2015   COPD (chronic obstructive pulmonary disease) (HCC)    pt denies - told this while was smoking -   Cough 04/26/2015   Depression 07/01/2017   Diabetes (HCC) 04/27/2015   Diagnosed 03/2015    Dizziness 06/10/2017   Dysphagia 04/26/2015   DYSPNEA 12/24/2007   Qualifier: Diagnosis of  By: [name]     Elevated TSH 08/09/2015   GASTRIC ULCER, ACUTE 04/07/2007   Annotation: EGD 04/07/07 Qualifier: Diagnosis of  By: [name], [name] E    GASTRITIS 04/07/2007   Annotation: EGD 04/07/07 Qualifier: Diagnosis of  By: [name], [name] E    GERD (gastroesophageal reflux disease)    H/O renal cell carcinoma 05/23/2017   Dx 2010, s/p cryoablation Follows with urology - Dr [name]   HIATAL HERNIA 04/07/2007   Annotation: EGD  04/07/07 Qualifier: Diagnosis of  By: [name], [name] E    History of CVA (cerebrovascular accident) 04/26/2015   History of gout 04/26/2015   Hyperlipidemia    Hypertension    Hypoxia 03/06/2023   Irritable bowel syndrome 04/04/2007   Qualifier: Diagnosis of  By: [name], [name] E    Kidney tumor (benign)    Nausea and vomiting 09/09/2017   Occipital neuralgia 05/09/2016   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 Dr [name] in [place]   OSTEOARTHRITIS 04/04/2007   Qualifier: Diagnosis of  By: [name], [name]    Personal history of colonic adenoma 06/07/2012   POSTNASAL DRIP SYNDROME 12/29/2007   Qualifier: Diagnosis of  By: [name], [name]     RESTLESS LEG SYNDROME 04/04/2007   Qualifier: Diagnosis of  By: [name], [name]    RHINOSINUSITIS, 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   Qualifier: Diagnosis of  By: [name], [name]    Sleep difficulties 09/09/2017   Snoring 04/26/2015   Stroke (HCC)    x2 2011   Thoracic aortic aneurysm 02/06/2018   Seen on MRI 01/25/18 of thoracic spine ordered by [facility] -- referred to CTS to monitor  -- 3.5 cm   TIA (transient ischemic attack)    TOBACCO ABUSE 12/17/2007   Qualifier: Diagnosis of  By: [name], Dorethia  Trigeminal neuralgia of right side of face 02/08/2016   URI (upper respiratory infection) 02/08/2016   Vitamin D  deficiency 03/12/2018    Assessment:  Patient Reported Symptoms:  Cognitive Cognitive Status: No symptoms reported      Neurological Neurological Review of Symptoms: No symptoms reported    HEENT HEENT Symptoms Reported: No symptoms reported      Cardiovascular Cardiovascular Symptoms Reported: No symptoms reported Does patient have uncontrolled Hypertension?: No    Respiratory Respiratory Symptoms Reported: No symptoms reported, Shortness of breath Other Respiratory Symptoms: oxygen at 2-2.5l [place] SOB with activity, no obvious shortness of breath noted while talking with patient.  patient reports she has a pulse oximeter to check oxygen saturations. She reports she has changed oxygen supply company to [facility] Patient and she now has the equipment that she needs.    Endocrine Endocrine Symptoms Reported: No symptoms reported Is patient diabetic?: Yes Is patient checking blood sugars at home?: No Endocrine Self-Management Outcome: 3 (uncertain)  Gastrointestinal Gastrointestinal Symptoms Reported: No symptoms reported      Genitourinary Genitourinary Symptoms Reported: No symptoms reported    Integumentary Integumentary Symptoms Reported: Incision Additional Integumentary Details: patient reports incision L hip healing. denies any signs/symptoms of infection. Skin Management Strategies: Routine screening, Adequate rest Skin Self-Management Outcome: 4 (good)  Musculoskeletal Musculoskelatal Symptoms Reviewed: Difficulty walking, Limited mobility Additional Musculoskeletal Details: left hip surgery, pain 8/10 resting helps. Patient reports [facility] has contacted her re: start of care, but does not have a specific start date. Musculoskeletal Management Strategies: Medication therapy, Routine screening, Medical device      Psychosocial Psychosocial Symptoms Reported: No symptoms reported     Quality of Family Relationships: supportive Do you feel physically threatened by others?: No    11/22/2023    PHQ2-9 Depression Screening   Little interest or pleasure in doing things Not at all  Feeling down, depressed, or hopeless Not at all  PHQ-2 - Total Score 0  Trouble falling or staying asleep, or sleeping too much    Feeling tired or having little energy    Poor appetite or overeating     Feeling bad about yourself - or that you are a failure or have let yourself or your family down    Trouble concentrating on things, such as reading the newspaper or watching television    Moving or speaking so slowly that other people could have noticed.  Or the  opposite - being so fidgety or restless that you have been moving around a lot more than usual    Thoughts that you would be better off dead, or hurting yourself in some way    PHQ2-9 Total Score    If you checked off any problems, how difficult have these problems made it for you to do your work, take care of things at home, or get along with other people    Depression Interventions/Treatment      There were no vitals filed for this visit.  Medications Reviewed Today   Medications were not reviewed in this encounter   Patient declines to review medications at this time. Patient reports an appointment with PCP to review medications. RNCM will also send referral to clinical pharmacist. Patient having difficulty affording CBG meter.  Recommendation:   PCP Follow-up upcoming appointment Work with Home Health agency as recommended  Follow Up Plan:   Telephone follow up appointment date/time:  11/28/23 at 1:30 pm  Heddy Shutter, RN, MSN, BSN, CCM [place]  [facility], Population Health Case Manager Phone: [phone]

## 2023-11-21 ENCOUNTER — Telehealth: Payer: Self-pay

## 2023-11-21 ENCOUNTER — Inpatient Hospital Stay: Admitting: Internal Medicine

## 2023-11-21 NOTE — Telephone Encounter (Signed)
 Message left for patient today and she has appointment with us  tomorrow.

## 2023-11-21 NOTE — Telephone Encounter (Signed)
 Okay for orders?

## 2023-11-21 NOTE — Telephone Encounter (Signed)
 Copied from CRM [id number]. Topic: Clinical - Home Health Verbal Orders >> Nov 21, 2023  1:47 PM Rea BROCKS wrote: Caller/Agency: [facility]  Callback Number: [phone] Service Requested: Skilled Nursing weekly for five weeks  Frequency: weekly for five weeks  Any new concerns about the patient? No

## 2023-11-21 NOTE — Progress Notes (Unsigned)
 Subjective:    Patient ID: Kerry Powell, female    DOB: 04-14-1945, 78 y.o.   MRN: [id number]     HPI Kesha is here for follow up from the hospital   Admitted 8/25 - 9/4  Discharged to SNF  Admitted with ground level fall.  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    Follow up with vascular, ortho  BP meds stopped - BP stable off them Urinary retention - prn cath in and out Other medical problems stable   Medications and allergies reviewed with patient and updated if appropriate.  Current Outpatient Medications on File Prior to Visit  Medication Sig Dispense Refill   albuterol  (PROVENTIL ) (2.5 MG/3ML) 0.083% nebulizer solution USE 1 VIAL IN NEBULIZER EVERY 6 HOURS AND AS NEEDED FOR SHORT BREATH/WHEEZE.  Generic:  Ventolin  75 mL 2   allopurinol  (ZYLOPRIM ) 300 MG tablet Take 1 tablet by mouth once daily 90 tablet 0   bisacodyl  (DULCOLAX) 10 MG suppository Place 1 suppository (10 mg total) rectally daily as needed for moderate constipation. 12 suppository 0   budesonide -formoterol  (SYMBICORT ) 160-4.5 MCG/ACT inhaler Inhale 2 puffs into the lungs 2 (two) times daily. 1 each 12   cetirizine  (ZYRTEC  ALLERGY) 10 MG tablet Take 10 mg by mouth daily as needed for allergies or rhinitis.     cholecalciferol (VITAMIN D3) 25 MCG (1000 UNIT) tablet Take 2,000 Units by mouth daily.     clopidogrel  (PLAVIX ) 75 MG tablet Take 1 tablet by mouth once daily 90 tablet 0   cyclobenzaprine  (FLEXERIL ) 5 MG tablet Take 1 tablet (5 mg total) by mouth 3 (three) times daily as needed. for muscle spams 270 tablet 1   DULoxetine  (CYMBALTA ) 30 MG capsule Take 2 capsules (60 mg total) by mouth daily.     FARXIGA  10 MG TABS tablet Take 1 tablet (10 mg total) by mouth daily. Take 10 mg by mouth daily. 90  tablet 3   ferrous sulfate  325 (65 FE) MG tablet Take 325 mg by mouth every other day.     fluticasone  (FLONASE ) 50 MCG/ACT nasal spray Place 2 sprays into both nostrils daily. (Patient taking differently: Place 2 sprays into both nostrils daily as needed for allergies.) 16 g 6   furosemide  (LASIX ) 20 MG tablet Take 1 tablet by mouth once daily 90 tablet 0   methocarbamol  (ROBAXIN ) 500 MG tablet Take 1 tablet (500 mg total) by mouth every 6 (six) hours as needed for muscle spasms.     montelukast  (SINGULAIR ) 10 MG tablet TAKE 1 TABLET BY MOUTH ONCE DAILY AT BEDTIME 30 tablet 5   MOUNJARO  5 MG/0.5ML Pen INJECT 1/2 (ONE-HALF) ML SUBCUTANEOUSLY  ONCE A WEEK (Patient taking differently: Inject 5 mg into the skin once a week. Sundays) 4 mL 0   oxyCODONE  (OXY IR/ROXICODONE ) 5 MG immediate release tablet Take 1 tablet (5 mg total) by mouth every 6 (six) hours as needed for up to 4 doses for moderate pain (pain score 4-6). 4 tablet 0   pantoprazole  (PROTONIX ) 40 MG tablet Take 1 tablet (40 mg total) by mouth 2 (two) times daily before a meal. 180 tablet 1   polyethylene glycol (MIRALAX  / GLYCOLAX ) 17 g packet Take 17 g by mouth 2 (two) times daily. 14 each 0   rosuvastatin  (  CRESTOR ) 40 MG tablet Take 1 tablet by mouth once daily 90 tablet 3   sodium chloride  (OCEAN) 0.65 % SOLN nasal spray Place 1 spray into both nostrils as needed for congestion. 480 mL 1   tiotropium (SPIRIVA  HANDIHALER) 18 MCG inhalation capsule Place 1 capsule (18 mcg total) into inhaler and inhale daily. 30 capsule 2   No current facility-administered medications on file prior to visit.     Review of Systems     Objective:  There were no vitals filed for this visit. BP Readings from Last 3 Encounters:  10/31/23 100/71  10/16/23 104/60  10/13/23 123/78   Wt Readings from Last 3 Encounters:  10/30/23 205 lb 0.4 oz (93 kg)  10/13/23 195 lb 11.2 oz (88.8 kg)  08/10/23 226 lb 3.1 oz (102.6 kg)   There is no height or weight  on file to calculate BMI.    Physical Exam     Lab Results  Component Value Date   WBC 8.7 10/28/2023   HGB 11.1 (L) 10/28/2023   HCT 33.8 (L) 10/28/2023   PLT 338 10/28/2023   GLUCOSE 140 (H) 10/28/2023   CHOL 119 06/05/2023   TRIG 101.0 06/05/2023   HDL 41.90 06/05/2023   LDLDIRECT 79.0 08/01/2020   LDLCALC 57 06/05/2023   ALT 14 10/22/2023   AST 18 10/22/2023   NA 140 10/28/2023   K 4.3 10/28/2023   CL 103 10/28/2023   CREATININE 1.45 (H) 10/31/2023   BUN 23 10/28/2023   CO2 24 10/28/2023   TSH 1.706 10/07/2023   INR 1.0 10/21/2023   HGBA1C 6.4 (H) 10/07/2023   MICROALBUR 0.9 06/05/2023     Assessment & Plan:    See Problem List for Assessment and Plan of chronic medical problems.

## 2023-11-22 ENCOUNTER — Telehealth: Payer: Self-pay | Admitting: Radiology

## 2023-11-22 ENCOUNTER — Ambulatory Visit: Admitting: Internal Medicine

## 2023-11-22 ENCOUNTER — Encounter: Payer: Self-pay | Admitting: Internal Medicine

## 2023-11-22 VITALS — BP 126/72 | HR 100 | Temp 98.3°F | Ht 65.0 in | Wt 190.0 lb

## 2023-11-22 DIAGNOSIS — R258 Other abnormal involuntary movements: Secondary | ICD-10-CM

## 2023-11-22 DIAGNOSIS — E1159 Type 2 diabetes mellitus with other circulatory complications: Secondary | ICD-10-CM | POA: Diagnosis not present

## 2023-11-22 DIAGNOSIS — S72142D Displaced intertrochanteric fracture of left femur, subsequent encounter for closed fracture with routine healing: Secondary | ICD-10-CM | POA: Diagnosis not present

## 2023-11-22 DIAGNOSIS — J432 Centrilobular emphysema: Secondary | ICD-10-CM | POA: Diagnosis not present

## 2023-11-22 DIAGNOSIS — J9611 Chronic respiratory failure with hypoxia: Secondary | ICD-10-CM

## 2023-11-22 DIAGNOSIS — M62838 Other muscle spasm: Secondary | ICD-10-CM

## 2023-11-22 DIAGNOSIS — I951 Orthostatic hypotension: Secondary | ICD-10-CM | POA: Insufficient documentation

## 2023-11-22 DIAGNOSIS — E1142 Type 2 diabetes mellitus with diabetic polyneuropathy: Secondary | ICD-10-CM

## 2023-11-22 DIAGNOSIS — R55 Syncope and collapse: Secondary | ICD-10-CM

## 2023-11-22 DIAGNOSIS — N184 Chronic kidney disease, stage 4 (severe): Secondary | ICD-10-CM

## 2023-11-22 DIAGNOSIS — I152 Hypertension secondary to endocrine disorders: Secondary | ICD-10-CM | POA: Diagnosis not present

## 2023-11-22 DIAGNOSIS — I5032 Chronic diastolic (congestive) heart failure: Secondary | ICD-10-CM

## 2023-11-22 DIAGNOSIS — I959 Hypotension, unspecified: Secondary | ICD-10-CM

## 2023-11-22 LAB — CBC WITH DIFFERENTIAL/PLATELET
Basophils Absolute: 0 K/uL (ref 0.0–0.1)
Basophils Relative: 0.5 % (ref 0.0–3.0)
Eosinophils Absolute: 0.2 K/uL (ref 0.0–0.7)
Eosinophils Relative: 2.2 % (ref 0.0–5.0)
HCT: 41.3 % (ref 36.0–46.0)
Hemoglobin: 12.9 g/dL (ref 12.0–15.0)
Lymphocytes Relative: 24.3 % (ref 12.0–46.0)
Lymphs Abs: 1.7 K/uL (ref 0.7–4.0)
MCHC: 31.3 g/dL (ref 30.0–36.0)
MCV: 95.9 fl (ref 78.0–100.0)
Monocytes Absolute: 0.6 K/uL (ref 0.1–1.0)
Monocytes Relative: 8.9 % (ref 3.0–12.0)
Neutro Abs: 4.5 K/uL (ref 1.4–7.7)
Neutrophils Relative %: 64.1 % (ref 43.0–77.0)
Platelets: 323 K/uL (ref 150.0–400.0)
RBC: 4.31 Mil/uL (ref 3.87–5.11)
RDW: 16.7 % — ABNORMAL HIGH (ref 11.5–15.5)
WBC: 7 K/uL (ref 4.0–10.5)

## 2023-11-22 LAB — COMPREHENSIVE METABOLIC PANEL WITH GFR
ALT: 13 U/L (ref 0–35)
AST: 20 U/L (ref 0–37)
Albumin: 3.6 g/dL (ref 3.5–5.2)
Alkaline Phosphatase: 138 U/L — ABNORMAL HIGH (ref 39–117)
BUN: 8 mg/dL (ref 6–23)
CO2: 31 meq/L (ref 19–32)
Calcium: 9.5 mg/dL (ref 8.4–10.5)
Chloride: 100 meq/L (ref 96–112)
Creatinine, Ser: 1.32 mg/dL — ABNORMAL HIGH (ref 0.40–1.20)
GFR: 38.62 mL/min — ABNORMAL LOW (ref >60.00)
Glucose, Bld: 102 mg/dL — ABNORMAL HIGH (ref 70–99)
Potassium: 3.2 meq/L — ABNORMAL LOW (ref 3.5–5.1)
Sodium: 137 meq/L (ref 135–145)
Total Bilirubin: 0.6 mg/dL (ref 0.2–1.2)
Total Protein: 7.3 g/dL (ref 6.0–8.3)

## 2023-11-22 LAB — MAGNESIUM: Magnesium: 1.8 mg/dL (ref 1.5–2.5)

## 2023-11-22 MED ORDER — METHOCARBAMOL 500 MG PO TABS: 500.0000 mg | ORAL_TABLET | Freq: Every day | ORAL | 0 refills | Status: DC

## 2023-11-22 MED ORDER — FLUDROCORTISONE ACETATE 0.1 MG PO TABS: 0.2000 mg | ORAL_TABLET | Freq: Every day | ORAL | 5 refills | Status: DC

## 2023-11-22 MED ORDER — CYCLOBENZAPRINE HCL 5 MG PO TABS: 5.0000 mg | ORAL_TABLET | Freq: Every evening | ORAL | 1 refills | Status: DC | PRN

## 2023-11-22 NOTE — Assessment & Plan Note (Signed)
 Having orthostatic hypotension associated with lightheadedness Has had several falls-?  Related to hypotension/lightheadedness,'s presyncope On fludrocortisone  for hypotension Will order Holter monitor to rule out arrhythmia

## 2023-11-22 NOTE — Assessment & Plan Note (Signed)
 Acute Fall at home resulted in intratrochanteric fracture of proximal left femur S/p ORIF with IM rod S/p rehab Incisions healing well without infection Having significant pain, muscle spasms Continue oxycodone  5 mg every 4 hours, methocarbamol  500 mg a.m., Flexeril  5 mg at bedtime I will refill her medication as needed

## 2023-11-22 NOTE — Progress Notes (Signed)
 Cardiology Office Note:  .   Date:  11/26/2023  ID:  Kerry Powell, DOB [birth year], MRN [id number] PCP: Geofm Glade PARAS, MD  [facility] Providers Cardiologist:  Wilbert Bihari, MD    History of Present Illness: .   Kerry Powell is a 78 y.o. female   with a hx of thoracic aortic aneurysm, CVA, PVD, type 2 diabetes, COPD, hyperlipidemia, coronary artery disease noted on CCTA, hypertension, chronic HFpEF, CKD stage 3b, atrial tachycardia, and bipolar disorder  who is being seen 10/22/2023 for the evaluation of preoperative evaluation after a ground level fall. Lexiscan  was intermediate risk and she was cleared for hip surgery. BP ran low and BP meds held.  Patient at [facility] rehab. On florinef  for orthostatic hypotension. PCP ordred a Zio. She was orthostatic at PCP office 11/22/23.She comes in with her daughter. She is still dizzy and brings orthostatic readings in from home. This am BP sitting 141/105 P 106  BP standing 125/85 P 117/m  K 3.2 Crt 1.32. K ordered. She is only drinking ~ 12 ounces water a day and 3 cans of cherry coke, no appetite.   ROS:    Studies Reviewed: SABRA         Prior CV Studies:    NST 10/23/23 IMPRESSION: 1. Significant motion degradation of imaging. Potential reversible ischemia within the inferolateral wall.   2. Normal left ventricular wall motion.   3. Left ventricular ejection fraction greater than 70%   4. Non invasive risk stratification*: Intermediate  Echo 10/22/23 IMPRESSIONS     1. Left ventricular ejection fraction, by estimation, is 60 to 65%. The  left ventricle has normal function. Left ventricular endocardial border  not optimally defined to evaluate regional wall motion. There is mild  concentric left ventricular  hypertrophy. Left ventricular diastolic parameters are consistent with  Grade I diastolic dysfunction (impaired relaxation).   2. Right ventricular systolic function is normal. The right ventricular  size is normal. There  is normal pulmonary artery systolic pressure. The  estimated right ventricular systolic pressure is 21.8 mmHg.   3. Left atrial size was mildly dilated.   4. The mitral valve is normal in structure. No evidence of mitral valve  regurgitation. No evidence of mitral stenosis.   5. The aortic valve is tricuspid. There is mild calcification of the  aortic valve. Aortic valve regurgitation is not visualized. No aortic  stenosis is present.   6. The inferior vena cava is normal in size with greater than 50%  respiratory variability, suggesting right atrial pressure of 3 mmHg.   7. Technically difficult study with poor acoustic windows.     CT scan showing large burden of irregular mural thrombus particularly descending thoracic aorta/coronary atherosclerosis: Plavix  on hold.  Dr. Silver from [facility] was curbsided who advised outpatient follow-up and start [facility] once able Patient had preop workup -ECHO showed EF 60-65% and Grade 1 diastolic dysfunction. Lexiscan  stress test done on 8/27 showed intermediate risk. As per cardiology Given the lack of high risk stress test findings, normal ejection fraction, and planned orthopedic surgery which is not high risk from a cardiovascular perspective, would proceed with orthopedic surgery as planned Prior lexiscan  done on 03/09/2022 was unremarkable. Continue on home meds -resume Plavix ,crestor .        Risk Assessment/Calculations:           Physical Exam:   VS:  SpO2 98%    Orhtostatics: Orthostatic VS for the past 24 hrs (Last 3 readings):  BP- Lying Pulse- Lying BP- Sitting Pulse- Sitting BP- Standing at 0 minutes Pulse- Standing at 0 minutes  11/26/23 1422 173/72 98 136/76 109 116/70 109   Wt Readings from Last 3 Encounters:  11/22/23 190 lb (86.2 kg)  10/30/23 205 lb 0.4 oz (93 kg)  10/13/23 195 lb 11.2 oz (88.8 kg)    GEN: Well nourished, well developed in no acute distress NECK: No JVD; No carotid bruits CARDIAC:  RRR, no murmurs, rubs,  gallops RESPIRATORY:  Clear to auscultation without rales, wheezing or rhonchi  ABDOMEN: Soft, non-tender, non-distended EXTREMITIES:  No edema; No deformity   ASSESSMENT AND PLAN: .    Orthostatic hypotension improving some with stopping BP meds and addition of florinef . Not wearing compression hose today(can't wear on her left with broken foot), consider abdominal binder. 64 ounces water daily, decrease cherry coke intake. Dr. Geofm ordered Zio monitor.  CAD -CT scan showing large burden of irregular mural thrombus particularly descending thoracic aorta/coronary atherosclerosis: Plavix  on hold.  Dr. Silver from [facility] was curbsided who advised outpatient follow-up and start [facility] once able Patient had preop workup -ECHO showed EF 60-65% and Grade 1 diastolic dysfunction. Lexiscan  stress test done on 8/27 showed intermediate risk. As per cardiology Given the lack of high risk stress test findings, normal ejection fraction, and planned orthopedic surgery which is not high risk from a cardiovascular perspective, would proceed with orthopedic surgery as planned Prior lexiscan  done on 03/09/2022 was unremarkable. Continue on home meds -resume Plavix ,crestor .  CT scan showing large burden of irregular mural thrombus particularly descending thoracic aorta/coronary atherosclerosis: Back on Plavix    Dr. Silver from [facility] was curbsided who advised outpatient follow-up and start [facility] once able     Chronic HFpEF: NYHA II.  -compensated, lasix  is prn          Dispo: f/u Dr. Shlomo 2-3 months  Signed, [name], PA-C

## 2023-11-22 NOTE — Assessment & Plan Note (Signed)
 Chronic Moderate centrilobular emphysema Chronic respiratory failure with hypoxia Oxygen continuously 2 L/min via nasal cannula Continue Symbicort  2 puffs twice daily, Spiriva  daily and albuterol  as needed Following with pulmonary

## 2023-11-22 NOTE — Assessment & Plan Note (Signed)
 Chronic  likely diabetic neuropathy Unable to take gabapentin because of fluid retention Continue cymbalta  60 mg daily-we can increase this if needed

## 2023-11-22 NOTE — Assessment & Plan Note (Addendum)
 New While hospitalized her blood pressure medications were discontinued-blood pressure remained stable While in rehab was having drops in BP and oxygen level Started on fludrocortisone  0.1 mg daily Has been wearing a compression sock on the right leg, but not consistently-stressed wearing this daily BP readings improved slightly, but still having some readings SBP 80-90s and experiencing lightheadedness when standing frequently Will increase fludrocortisone  to 0.2 mg daily Sees cardiology next week Will order Holter monitor to rule out arrhythmia

## 2023-11-22 NOTE — Assessment & Plan Note (Signed)
 Chronic Appears euvolemic Continue furosemide  as needed-would like to keep to a minimum if possible because of hypotension

## 2023-11-22 NOTE — Assessment & Plan Note (Signed)
 Chronic Stable Will order CMP, CBC transient hypotension-on fludrocortisone  Lasix  as needed for edema-encouraged using compression sock

## 2023-11-22 NOTE — Telephone Encounter (Signed)
 Copied from CRM #[id number]. Topic: Clinical - Home Health Verbal Orders >> Nov 22, 2023  1:06 PM Franky GRADE wrote: Caller/Agency: [facility]  Callback Number: [phone] Service Requested: Physical Therapy Frequency: 2 times a week for 3 weeks.  Any new concerns about the patient? Yes, patient suffered a fall today.

## 2023-11-22 NOTE — Telephone Encounter (Signed)
 Okay for orders?

## 2023-11-22 NOTE — Telephone Encounter (Signed)
 Verbals given today.

## 2023-11-22 NOTE — Assessment & Plan Note (Signed)
 Chronic Still requires oxygen continuously On 2 L / min via   Advised to make pulmonary appointment

## 2023-11-22 NOTE — Patient Instructions (Signed)
 Visit Information  Thank you for taking time to visit with me today. Please don't hesitate to contact me if I can be of assistance to you before our next scheduled appointment.  Our next appointment is by telephone on 11/28/23 at 1:30 pm Please call the care guide team at [phone] if you need to cancel or reschedule your appointment.   Following is a copy of your care plan:   Goals Addressed             This Visit's Progress    VBCI RN Care Plan       Problems:  Chronic Disease Management support and education needs related to COPD Recent discharge from SNF rehab following repair of Left hip Fall risk Cost of Glucose meter-patient reports unable to afford  Goal: Over the next 90 days the Patient will demonstrate Improved adherence to prescribed treatment plan for COPD as evidenced by patient and /or review of chart verbalize understanding of plan for management of COPD as evidenced by patient report and/or review of chart Over the next 90 days, Patient will verbalize receipt of all equipment needs as evidence by patient report and/or review of chart  Interventions:  Post transition to home from SNF: Evaluation of patient health status post discharge from SNF and management of health care needs Attempted medication review/reconciliation however patient declines opting to review at upcoming PCP visit. Reviewed upcoming appointment and advised to attend visits as scheduled. Patient confirms she has transportation. Clinical pharmacist referral for medication reconciliation Discussed signs/symptoms of infection to monitor for wound clinical pharmacy referral regarding: unable to afford glucose meter and medication reconciliation  COPD Interventions: Advised patient to attend appointments as recommended/scheduled Discussed use of oxygen and confirmed patient has oxygen tanks that she needs Confirmed patient has pulse oximeter and checks oxygen saturation Confirmed home health has  contacted patient  Falls Interventions: Provided written and verbal education re: potential causes of falls and Fall prevention strategies Advised patient of importance of notifying provider of falls Confirmed patient has been contacted by home health regarding services.  Patient Self-Care Activities:  Attend all scheduled provider appointments Call pharmacy for medication refills 3-7 days in advance of running out of medications Call provider office for new concerns or questions  Monitor incision for signs/symptoms of infection  Plan:  Telephone follow up appointment with care management team member scheduled for:  11/28/26 at 1:30 pm             Please call the Suicide and Crisis Lifeline: 988 call the [place]  National Suicide Prevention Lifeline: 613-712-7414 or TTY: 518-398-2822 TTY (423) 006-2064) to talk to a trained counselor call 1-800-273-TALK (toll free, 24 hour hotline) if you are experiencing a Mental Health or Behavioral Health Crisis or need someone to talk to.  Patient verbalizes understanding of instructions and care plan provided today and agrees to view in MyChart. Active MyChart status and patient understanding of how to access instructions and care plan via MyChart confirmed with patient.     Heddy Shutter, RN, MSN, BSN, CCM [place]  [facility], Population Health Case Manager Phone: [phone]

## 2023-11-22 NOTE — Patient Instructions (Addendum)
     Tylenol  - you can take up to 3000 mg / day   Blood work was ordered.       Medications changes include :   increase fludrocortisone  to 2 pills daily     Return in about 2 months (around 01/22/2024) for follow up.

## 2023-11-23 ENCOUNTER — Ambulatory Visit: Payer: Self-pay | Admitting: Internal Medicine

## 2023-11-23 MED ORDER — POTASSIUM CHLORIDE CRYS ER 20 MEQ PO TBCR: 20.0000 meq | EXTENDED_RELEASE_TABLET | Freq: Every day | ORAL | 3 refills | Status: DC

## 2023-11-25 ENCOUNTER — Other Ambulatory Visit (HOSPITAL_COMMUNITY): Payer: Self-pay

## 2023-11-25 ENCOUNTER — Telehealth: Payer: Self-pay | Admitting: Pharmacy Technician

## 2023-11-25 ENCOUNTER — Encounter: Payer: Self-pay | Admitting: Internal Medicine

## 2023-11-25 NOTE — Telephone Encounter (Signed)
 Pharmacy Patient Advocate Encounter   Received notification from CoverMyMeds that prior authorization for Methocarbamol  50mg  is required/requested.   Insurance verification completed.   The patient is insured through [facility].   Per test claim: PA required; PA submitted to above mentioned insurance via Latent Key/confirmation #/EOC [id number] Status is pending

## 2023-11-25 NOTE — Telephone Encounter (Signed)
 Pharmacy Patient Advocate Encounter  Received notification from [facility] that Prior Authorization for Methocarbamol  50mg  has been APPROVED from now to 02/23/24. Ran test claim, Copay is $0. This test claim was processed through [facility]- copay amounts may vary at other pharmacies due to pharmacy/plan contracts, or as the patient moves through the different stages of their insurance plan.   PA #/Case ID/Reference #: [id number]  Patient may fill her prescription now.

## 2023-11-26 ENCOUNTER — Ambulatory Visit: Attending: Physician Assistant | Admitting: Physician Assistant

## 2023-11-26 DIAGNOSIS — I5032 Chronic diastolic (congestive) heart failure: Secondary | ICD-10-CM | POA: Diagnosis not present

## 2023-11-26 DIAGNOSIS — I10 Essential (primary) hypertension: Secondary | ICD-10-CM

## 2023-11-26 DIAGNOSIS — I951 Orthostatic hypotension: Secondary | ICD-10-CM

## 2023-11-26 DIAGNOSIS — I2510 Atherosclerotic heart disease of native coronary artery without angina pectoris: Secondary | ICD-10-CM | POA: Diagnosis not present

## 2023-11-26 DIAGNOSIS — I7410 Embolism and thrombosis of unspecified parts of aorta: Secondary | ICD-10-CM | POA: Diagnosis not present

## 2023-11-26 NOTE — Patient Instructions (Signed)
 Medication Instructions:  NO CHANGES  Lab Work: NONE TO BE DONE TODAY.  Testing/Procedures: NONE  Follow-Up: At [facility], you and your health needs are our priority.  As part of our continuing mission to provide you with exceptional heart care, our providers are all part of one team.  This team includes your primary Cardiologist (physician) and Advanced Practice Providers or APPs (Physician Assistants and Nurse Practitioners) who all work together to provide you with the care you need, when you need it.  Your next appointment:   2-3 MONTHS  Provider:   Wilbert Bihari, MD     Other Instructions: PLEASE BE SURE TO DRINK AT LEAST 64 OZ OF WATER DAILY. PLEASE DECREASE YOUR CHERRY COKE INTAKE.

## 2023-11-27 ENCOUNTER — Telehealth: Payer: Self-pay | Admitting: *Deleted

## 2023-11-27 ENCOUNTER — Ambulatory Visit: Payer: Self-pay

## 2023-11-27 ENCOUNTER — Telehealth: Payer: Self-pay | Admitting: Internal Medicine

## 2023-11-27 MED ORDER — CYCLOBENZAPRINE HCL 5 MG PO TABS: 5.0000 mg | ORAL_TABLET | Freq: Every evening | ORAL | 0 refills | Status: DC | PRN

## 2023-11-27 MED ORDER — OXYCODONE HCL 5 MG PO TABS: 5.0000 mg | ORAL_TABLET | Freq: Three times a day (TID) | ORAL | 0 refills | Status: AC | PRN

## 2023-11-27 MED ORDER — METHOCARBAMOL 500 MG PO TABS: 500.0000 mg | ORAL_TABLET | Freq: Every day | ORAL | 0 refills | Status: DC

## 2023-11-27 MED ORDER — OXYCODONE HCL 5 MG PO TABS: 5.0000 mg | ORAL_TABLET | Freq: Three times a day (TID) | ORAL | 0 refills | Status: DC | PRN

## 2023-11-27 NOTE — Telephone Encounter (Signed)
 Copied from CRM #[id number]. Topic: Clinical - Medication Refill >> Nov 27, 2023 10:36 AM Aleatha C wrote: Medication: oxyCODONE  (OXY IR/ROXICODONE ) 5 MG immediate release tablet and   Has the patient contacted their pharmacy? Yes (Agent: If no, request that the patient contact the pharmacy for the refill. If patient does not wish to contact the pharmacy document the reason why and proceed with request.) (Agent: If yes, when and what did the pharmacy advise?)  This is the patient's preferred pharmacy:  [facility] [address] ([address] [place] - [address] [place] ([place]) [name] [address] Phone: [phone] Fax: [phone]  Is this the correct pharmacy for this prescription? Yes If no, delete pharmacy and type the correct one.   Has the prescription been filled recently? No  Is the patient out of the medication? No  Has the patient been seen for an appointment in the last year OR does the patient have an upcoming appointment? Yes  Can we respond through MyChart? No  Agent: Please be advised that Rx refills may take up to 3 business days. We ask that you follow-up with your pharmacy.

## 2023-11-27 NOTE — Telephone Encounter (Signed)
  Reason for Disposition  [1] Caller has URGENT medicine question about med that primary care doctor (or NP/PA) or specialist prescribed AND [2] triager unable to answer question  Answer Assessment - Initial Assessment Questions Med request received requesting night pills and stronger pain medication. Triage encounter created to assess pain and clarify medications being requested.   Note: OV note on 11/22/23 states she is on oxycodone  5 mg every 4 hours, methocarbamol  500 mg a.m., Flexeril  5 mg at bedtime for pain and muscle spasms.  Patient states that she is also requesting flexeril  refill. Patient reports muscle spasms. States current regimen is not helping her pain. States pain level is 8/10 before medications and 4/10 after taking medications. Requesting something more long lasting.   Patient asking if she can use her stationary bike. Patient states MyChart or telephone calls are ok with her.  Protocols used: Medication Question Call-A-AH

## 2023-11-27 NOTE — Telephone Encounter (Signed)
 Copied from CRM [id number]. Topic: Clinical - Medication Question >> Nov 27, 2023 10:38 AM Kerry Powell wrote: Reason for CRM: Patient has a muscle relaxer for night and morning and she also needs her night pills, she also wants to know if it is something stronger that can be prescribe for her pain management

## 2023-11-27 NOTE — Progress Notes (Signed)
 Care Guide Pharmacy Note  11/27/2023 Name: Kerry Powell MRN: [id number] DOB: 05/22/1945  Referred By: Geofm Glade PARAS, MD Reason for referral: Call Attempt #1 and Complex Care Management (Outreach to schedule referral with pharmacist )   [name] is a 78 y.o. year old female who is a primary care patient of Geofm Glade PARAS, MD.  JAMIE HAFFORD was referred to the pharmacist for assistance related to: DMII  Successful contact was made with the patient to discuss pharmacy services including being ready for the pharmacist to call at least 5 minutes before the scheduled appointment time and to have medication bottles and any blood pressure readings ready for review. The patient agreed to meet with the pharmacist via telephone visit on 12/05/2023  Thedford Franks, CMA [place]  [facility], [facility] Guide Direct Dial: [phone]  Fax: [phone] Website: [email or web address]

## 2023-11-27 NOTE — Telephone Encounter (Signed)
 Okay to use stationary bike.  She needs to be moving is much as possible-that will help the healing.  Because of the fractures as heals most of her pain is muscular.  She needs to start extending out the oxycodone  and using less of that-I will give her another refill but she cannot take this long-term and we need to get her off of it   Prescription sent in in another encounter

## 2023-11-28 ENCOUNTER — Telehealth: Payer: Self-pay

## 2023-11-28 ENCOUNTER — Other Ambulatory Visit: Payer: Self-pay

## 2023-11-28 NOTE — Telephone Encounter (Signed)
 Copied from CRM [id number]. Topic: General - Other >> Nov 28, 2023 11:29 AM Pinkey ORN wrote: Reason for CRM: Home Health Report >> Nov 28, 2023 11:34 AM Pinkey ORN wrote: [facility] [phone] Called to give report about patient blood pressure reading 146/105, 151/97, 144/107, 168/114, 132/96, 148/102/, 130/94, 141/105, 125/85, 137/100 and a standing 96/70. Patient experienced a few falls, the left elbow is swollen and looks as if there's fluid built up.

## 2023-11-28 NOTE — Telephone Encounter (Signed)
Called and left message today

## 2023-11-28 NOTE — Patient Instructions (Signed)
 Visit Information  Thank you for taking time to visit with me today. Please don't hesitate to contact me if I can be of assistance to you before our next scheduled appointment.  Your next care management appointment is by telephone on 12/13/23 at 10:30 am   Please call the care guide team at [phone] if you need to cancel, schedule, or reschedule an appointment.   Please call the Suicide and Crisis Lifeline: 988 call the [place]  National Suicide Prevention Lifeline: 951-461-7258 or TTY: 479 716 3719 TTY 6071946049) to talk to a trained counselor if you are experiencing a Mental Health or Behavioral Health Crisis or need someone to talk to.  Heddy Shutter, RN, MSN, BSN, CCM [place]  [facility], Population Health Case Manager Phone: [phone]   Fall Prevention in the Home Falls can cause injuries and affect people of all ages. There are many simple things that you can do to make your home safe and to help prevent falls. If you need it, ask for help making these changes. What actions can I take to prevent falls? General information Use good lighting in all rooms. Make sure to: Replace any light bulbs that burn out. Turn on lights if it is dark and use night-lights. Keep items that you use often in easy-to-reach places. Lower the shelves around your home if needed. Move furniture so that there are clear paths around it. Do not keep throw rugs or other things on the floor that can make you trip. If any of your floors are uneven, fix them. Add color or contrast paint or tape to clearly mark and help you see: Grab bars or handrails. First and last steps of staircases. Where the edge of each step is. If you use a ladder or stepladder: Make sure that it is fully opened. Do not climb a closed ladder. Make sure the sides of the ladder are locked in place. Have someone hold the ladder while you use it. Know where your pets are as you move through your  home. What can I do in the bathroom?     Keep the floor dry. Clean up any water that is on the floor right away. Remove soap buildup in the bathtub or shower. Buildup makes bathtubs and showers slippery. Use non-skid mats or decals on the floor of the bathtub or shower. Attach bath mats securely with double-sided, non-slip rug tape. If you need to sit down while you are in the shower, use a non-slip stool. Install grab bars by the toilet and in the bathtub and shower. Do not use towel bars as grab bars. What can I do in the bedroom? Make sure that you have a light by your bed that is easy to reach. Do not use any sheets or blankets on your bed that hang to the floor. Have a firm bench or chair with side arms that you can use for support when you get dressed. What can I do in the kitchen? Clean up any spills right away. If you need to reach something above you, use a sturdy step stool that has a grab bar. Keep electrical cables out of the way. Do not use floor polish or wax that makes floors slippery. What can I do with my stairs? Do not leave anything on the stairs. Make sure that you have a light switch at the top and the bottom of the stairs. Have them installed if you do not have them. Make sure that there are handrails on both sides  of the stairs. Fix handrails that are broken or loose. Make sure that handrails are as long as the staircases. Install non-slip stair treads on all stairs in your home if they do not have carpet. Avoid having throw rugs at the top or bottom of stairs, or secure the rugs with carpet tape to prevent them from moving. Choose a carpet design that does not hide the edge of steps on the stairs. Make sure that carpet is firmly attached to the stairs. Fix any carpet that is loose or worn. What can I do on the outside of my home? Use bright outdoor lighting. Repair the edges of walkways and driveways and fix any cracks. Clear paths of anything that can make you  trip, such as tools or rocks. Add color or contrast paint or tape to clearly mark and help you see high doorway thresholds. Trim any bushes or trees on the main path into your home. Check that handrails are securely fastened and in good repair. Both sides of all steps should have handrails. Install guardrails along the edges of any raised decks or porches. Have leaves, snow, and ice cleared regularly. Use sand, salt, or ice melt on walkways during winter months if you live where there is ice and snow. In the garage, clean up any spills right away, including grease or oil spills. What other actions can I take? Review your medicines with your health care provider. Some medicines can make you confused or feel dizzy. This can increase your chance of falling. Wear closed-toe shoes that fit well and support your feet. Wear shoes that have rubber soles and low heels. Use a cane, walker, scooter, or crutches that help you move around if needed. Talk with your provider about other ways that you can decrease your risk of falls. This may include seeing a physical therapist to learn to do exercises to improve movement and strength. Where to find more information Centers for Disease Control and Prevention, STEADI: TonerPromos.no [employer] on Aging: BaseRingTones.pl National Institute on Aging: BaseRingTones.pl Contact a health care provider if: You are afraid of falling at home. You feel weak, drowsy, or dizzy at home. You fall at home. Get help right away if you: Lose consciousness or have trouble moving after a fall. Have a fall that causes a head injury. These symptoms may be an emergency. Get help right away. Call 911. Do not wait to see if the symptoms will go away. Do not drive yourself to the hospital. This information is not intended to replace advice given to you by your health care provider. Make sure you discuss any questions you have with your health care provider. Document Revised: 10/16/2021  Document Reviewed: 10/16/2021 Elsevier Patient Education  2024 [employer].

## 2023-11-28 NOTE — Telephone Encounter (Signed)
 Copied from CRM #[id number]. Topic: General - Other >> Nov 28, 2023  8:38 AM Carlyon D wrote: Reason for CRM: Amy with [facility] stating pt has had 3 total falls, Physical therapy missed an appt with pt due to her pain. Physical therapist is not sure what to do . She is looking for some guidance as she is not sure what to do next. Pt is seeing patient today around 57. Please reach out if any further questions  Call back # to physical therapist Amy M. [phone]

## 2023-11-28 NOTE — Patient Outreach (Signed)
 Complex Care Management   Visit Note  11/28/2023  Name:  Kerry Powell MRN: [id number] DOB: 01-20-1946  Situation: Referral received for Complex Care Management related to transition from SNF post Left Hip Fracture repair I obtained verbal consent from Patient.  Visit completed with Patient and Kerry Powell (spouse)  on the phone  Background:   Past Medical History:  Diagnosis Date   Allergy    Anemia    Anxiety    Arthritis    BACK PAIN, CHRONIC 04/04/2007   Qualifier: Diagnosis of  By: [name], [name]    BIPOLAR AFFECTIVE DISORDER 04/04/2007   Qualifier: History of  By: [name], [name] E    CAD (coronary artery disease), native coronary artery    mild CAD with no obstructive disease by coronary CTA 06/2019   Cataract    removed both eyes   CHF (congestive heart failure) (HCC)    Chronic neck pain 04/30/2015   Cervical spondylosis [place] [facility] Intermittent numbness and tingling in occipital region and headaches S/p 2 occipital nerve blocks - only temp relief no relief with gabapentin, ultram , brace MRI neck - facet arthrosis    CKD (chronic kidney disease) 04/26/2015   COPD (chronic obstructive pulmonary disease) (HCC)    pt denies - told this while was smoking -   Cough 04/26/2015   Depression 07/01/2017   Diabetes (HCC) 04/27/2015   Diagnosed 03/2015    Dizziness 06/10/2017   Dysphagia 04/26/2015   DYSPNEA 12/24/2007   Qualifier: Diagnosis of  By: [name]     Elevated TSH 08/09/2015   GASTRIC ULCER, ACUTE 04/07/2007   Annotation: EGD 04/07/07 Qualifier: Diagnosis of  By: [name], [name] E    GASTRITIS 04/07/2007   Annotation: EGD 04/07/07 Qualifier: Diagnosis of  By: [name], [name] E    GERD (gastroesophageal reflux disease)    H/O renal cell carcinoma 05/23/2017   Dx 2010, s/p cryoablation Follows with urology - Dr [name]   HIATAL HERNIA 04/07/2007   Annotation: EGD 04/07/07 Qualifier: Diagnosis of  By: [name],  [name] E    History of CVA (cerebrovascular accident) 04/26/2015   History of gout 04/26/2015   Hyperlipidemia    Hypertension    Hypoxia 03/06/2023   Irritable bowel syndrome 04/04/2007   Qualifier: Diagnosis of  By: [name], [name]    Kidney tumor (benign)    Nausea and vomiting 09/09/2017   Occipital neuralgia 05/09/2016   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 Dr [name] in [place]   OSTEOARTHRITIS 04/04/2007   Qualifier: Diagnosis of  By: [name], [name]    Personal history of colonic adenoma 06/07/2012   POSTNASAL DRIP SYNDROME 12/29/2007   Qualifier: Diagnosis of  By: [name], [name]     RESTLESS LEG SYNDROME 04/04/2007   Qualifier: Diagnosis of  By: [name], [name]    RHINOSINUSITIS, 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 Patient denies any pain at this time reporting that she has taken her pain medication. She denies any pain at this time. Call completed with both patient and her husband. She denies any questions at this time or concerns. Patient Reported Symptoms:  Cognitive Cognitive Status: No symptoms reported      Neurological Neurological Review of Symptoms: No symptoms reported    HEENT HEENT Symptoms Reported: No symptoms reported      Cardiovascular Cardiovascular Symptoms Reported: No symptoms reported Cardiovascular Comment: reports weight 190lb about  2-3 days ago.  Respiratory Respiratory Symptoms Reported: No symptoms reported Other Respiratory Symptoms: using O2 continuous-husband unable to states liters at this time.    Endocrine Endocrine Symptoms Reported: No symptoms reported Is patient diabetic?: Yes Is patient checking blood sugars at home?: No    Gastrointestinal Gastrointestinal Symptoms Reported: No symptoms reported      Genitourinary Genitourinary Symptoms Reported: No symptoms reported    Integumentary Integumentary Symptoms Reported: Incision Additional Integumentary Details: incision to left hip healing. no signs/symptoms of infection    Musculoskeletal Musculoskelatal Symptoms Reviewed: Limited mobility Additional Musculoskeletal Details: admitted 10/21/23-10/31/23 post fall and Left hip fracture repair(discharged to SNF). Patient transitioned to home 11/16/23. uses walker and wheelchair to get around. [facility] home health therapy and nursing involved. Musculoskeletal Management Strategies: Medical device, Medication therapy Falls in the past year?: Yes (per husband, patient slide off the bed this morning.) Number of falls in past year: 2 or more Was there an injury with Fall?: Yes Fall Risk Category Calculator: 3 Patient Fall Risk Level: High Fall Risk Patient at Risk for Falls Due to: History of fall(s), Impaired balance/gait  Psychosocial Psychosocial Symptoms Reported: Not assessed (patient asleep)          11/28/2023    PHQ2-9 Depression Screening   Little interest or pleasure in doing things    Feeling down, depressed, or hopeless    PHQ-2 - Total Score    Trouble falling or staying asleep, or sleeping too much    Feeling tired or having little energy    Poor appetite or overeating     Feeling bad about yourself - or that you are a failure or have let yourself or your family down    Trouble concentrating on things, such as reading the newspaper or watching television    Moving or speaking so slowly that  other people could have noticed.  Or the opposite - being so fidgety or restless that you have been moving around a lot more than usual    Thoughts that you would be better off dead, or hurting yourself in some way    PHQ2-9 Total Score    If you checked off any problems, how difficult have these problems made it for you to do your work, take care of things at home, or get along with other people    Depression Interventions/Treatment      There were no vitals filed for this visit.  Medications Reviewed Today     Reviewed by Corra Kaine M, RN (Registered Nurse) on 11/28/23 at 1341  Med List Status: <None>   Medication Order Taking? Sig Documenting Provider Last Dose Status Informant  albuterol  (PROVENTIL ) (2.5 MG/3ML) 0.083% nebulizer solution [id number] Yes USE 1 VIAL IN NEBULIZER EVERY 6 HOURS AND AS NEEDED FOR SHORT BREATH/WHEEZE.  Generic:  Ventolin  Meade Verdon RAMAN, MD  Active Self, Pharmacy Records, Other  allopurinol  (ZYLOPRIM ) 300 MG tablet [id number]  Take 1 tablet by mouth once daily  Patient not taking: Reported on 11/28/2023   Geofm Glade PARAS, MD  Active Self, Pharmacy Records, Other  bisacodyl  (DULCOLAX) 10 MG suppository [id number] Yes Place 1 suppository (10 mg total) rectally daily as needed for moderate constipation. Christobal Guadalajara, MD  Active   budesonide -formoterol  (SYMBICORT ) 160-4.5 MCG/ACT inhaler [id number] Yes Inhale 2 puffs into the lungs 2 (two) times daily. Raenelle Donalda HERO, MD  Active Self, Pharmacy Records, Other  cetirizine  (ZYRTEC  ALLERGY) 10 MG tablet [id number] Yes Take 10 mg by mouth daily as needed for allergies or rhinitis. [provider]  Active Self, Pharmacy Records, Other           Med Note DINO, [facility]   Sun Oct 06, 2023  8:30 PM)    cholecalciferol (VITAMIN D3) 25 MCG (1000 UNIT) tablet [id number] Yes Take 2,000 Units by mouth daily. [provider]  Active Self, Pharmacy Records, Other  clopidogrel  (PLAVIX ) 75 MG tablet [id number] Yes  Take 1 tablet by mouth once daily Burns, Glade PARAS, MD  Active Self, Pharmacy Records, Other  cyclobenzaprine  (FLEXERIL ) 5 MG tablet [id number] Yes Take 1 tablet (5 mg total) by mouth at bedtime as needed. for muscle spams Burns, Glade PARAS, MD  Active   DULoxetine  (CYMBALTA ) 30 MG capsule [id number] Yes Take 2 capsules (60 mg total) by mouth daily. Geofm Glade PARAS, MD  Active Self, Pharmacy Records, Other           Med Note ROZENA, [place]   Dju Oct 26, 2023  8:42 PM) Patient has been unable to find medication at home, has been without for several weeks  FARXIGA  10 MG TABS tablet [id number] Yes Take 1 tablet (10 mg total) by mouth daily. Take 10 mg by mouth daily. Geofm Glade PARAS, MD  Active Self, Pharmacy Records, Other  ferrous sulfate  325 (65 FE) MG tablet [id number] Yes Take 325 mg by mouth every other day. [provider]  Active Self, Pharmacy Records, Other  fludrocortisone  (FLORINEF ) 0.1 MG tablet [id number]  Take 2 tablets (0.2 mg total) by mouth daily. Geofm Glade PARAS, MD  Active   fluticasone  (FLONASE ) 50 MCG/ACT nasal spray [id number] Yes Place 2 sprays into both nostrils daily.  Patient taking differently: Place 2 sprays into both nostrils daily as needed for allergies.   Soldatova, [place], MD  Active Self, Pharmacy Records, Other  furosemide  (LASIX ) 20 MG tablet [id number]  Take 1 tablet by mouth once daily Burns, Glade PARAS, MD  Active   methocarbamol  (ROBAXIN ) 500 MG tablet [id number]  Take 1 tablet (500 mg total) by mouth daily with breakfast. Geofm Glade PARAS, MD  Active   montelukast  (SINGULAIR ) 10 MG tablet [id number]  TAKE 1 TABLET BY MOUTH ONCE DAILY AT BEDTIME Geofm Glade PARAS, MD  Active Self, Pharmacy Records, Other  MOUNJARO  5 MG/0.5ML Pen [id number]  INJECT 1/2 (ONE-HALF) ML SUBCUTANEOUSLY  ONCE A WEEK  Patient taking differently: Inject 5 mg into the skin once a week. Sundays   Geofm Glade PARAS, MD  Active Self, Pharmacy Records, Other  oxyCODONE  (OXY IR/ROXICODONE ) 5 MG immediate  release tablet [id number] Yes Take 1 tablet (5 mg total) by mouth every 8 (eight) hours as needed for up to 5 days for severe pain (pain score 7-10). Geofm Glade PARAS, MD  Active   pantoprazole  (PROTONIX ) 40 MG tablet [id number] Yes Take 1 tablet (40 mg total) by mouth 2 (  two) times daily before a meal. Burns, Glade PARAS, MD  Active Self, Pharmacy Records, Other  polyethylene glycol (MIRALAX  / GLYCOLAX ) 17 g packet [id number] Yes Take 17 g by mouth 2 (two) times daily. Christobal Guadalajara, MD  Active   potassium chloride  SA (KLOR-CON  M) 20 MEQ tablet [id number] Yes Take 1 tablet (20 mEq total) by mouth daily. Geofm Glade PARAS, MD  Active   rosuvastatin  (CRESTOR ) 40 MG tablet [id number] Yes Take 1 tablet by mouth once daily Turner, Wilbert SAUNDERS, MD  Active Self, Pharmacy Records, Other  sodium chloride  (OCEAN) 0.65 % SOLN nasal spray [id number] Yes Place 1 spray into both nostrils as needed for congestion. Desai, Nikita S, MD  Active Self, Pharmacy Records, Other  tiotropium (SPIRIVA  HANDIHALER) 18 MCG inhalation capsule [id number] Yes Place 1 capsule (18 mcg total) into inhaler and inhale daily. Ghimire, Donalda HERO, MD  Active Self, Pharmacy Records, Other          Recommendation:   Continue Current Plan of Care Patient to continue working with home health services as recommended/scheduled.  Follow Up Plan:   Telephone follow up appointment date/time:  12/13/23 at 10:30 am  Heddy Shutter, RN, MSN, BSN, CCM [place]  [facility], Population Health Case Manager Phone: [phone]

## 2023-12-02 NOTE — Telephone Encounter (Unsigned)
 Copied from CRM [id number]. Topic: General - Call Back - No Documentation >> Dec 02, 2023  2:06 PM Kerry Powell wrote: Reason for CRM: Patient called in requesting an alternative medication for her pain. She stated that she is currently taking Oxycodone  but would like to discuss other options. Patient is requesting a call back from the nurse. Callback number: [phone].

## 2023-12-02 NOTE — Telephone Encounter (Signed)
 I see her next week.  I would recommend she wears the compression sock on her right leg.  She should also be using an abdominal binder-did she have 1 and if she using it?  These pills will help a little bit with preventing her blood pressure dropping when she is standing.

## 2023-12-03 ENCOUNTER — Other Ambulatory Visit: Payer: Self-pay | Admitting: Internal Medicine

## 2023-12-03 ENCOUNTER — Other Ambulatory Visit: Payer: Self-pay

## 2023-12-03 ENCOUNTER — Ambulatory Visit: Payer: Self-pay

## 2023-12-03 MED ORDER — METOPROLOL SUCCINATE ER 25 MG PO TB24: 12.5000 mg | ORAL_TABLET | Freq: Every day | ORAL | 3 refills | Status: DC

## 2023-12-03 NOTE — Telephone Encounter (Signed)
 FYI Only or Action Required?: Action required by provider: request for appointment.  Patient was last seen in primary care on 11/22/2023 by Geofm Glade PARAS, MD.  Called Nurse Triage reporting Hypertension.  Symptoms began today.  Interventions attempted: Nothing.  Symptoms are: gradually worsening.  Triage Disposition: See Physician Within 24 Hours  Patient/caregiver understands and will follow disposition?: Yes   Copied from CRM #[id number]. Topic: Clinical - Red Word Triage >> Dec 03, 2023  2:44 PM [place] wrote: Red Word that prompted transfer to Nurse Triage: Patient having extremely high blood pressure- reading at 172/105 pulse 101 . Reason for Disposition  Systolic BP >= 180 OR Diastolic >= 110    No available appts today. Advised ED.  Answer Assessment - Initial Assessment Questions No available appts today. Advised ED today.  1. BLOOD PRESSURE: What is your blood pressure? Did you take at least two measurements 5 minutes apart?     Last checked 5 minutes before calling, BP 172/105 hr 101 2. ONSET: When did you take your blood pressure?     Nurse asked pt to recheck BP: 170/105 HR 99, reports dizziness 3. HOW: How did you take your blood pressure? (e.g., automatic home BP monitor, visiting nurse)     Auto home BP arm 4. HISTORY: Do you have a history of high blood pressure?     HTN, orthostatic bp. Patient reports due to orthostatic bp, rehab MD took off all BP meds, pt reports Dr. Geofm aware 5. MEDICINES: Are you taking any medicines for blood pressure? Have you missed any doses recently?     Currently not taking any blood pressure, only taking medication to increase BP, fludrocortisone  acetate; reports have not take for 2 days. 6. OTHER SYMPTOMS: Do you have any symptoms? (e.g., blurred vision, chest pain, difficulty breathing, headache, weakness)     Denies blurred vision, chest pain, diff breathing, HA, weakness  Pain in left leg, spasms, constant,  oxycodone (request alternative, called MD last week), Hx:leg surgeries,  rehab 9/20 d/c home. Tylenol  not effective. 4/10  Protocols used: Blood Pressure - High-A-AH

## 2023-12-03 NOTE — Telephone Encounter (Signed)
 Called [facility], spoke with Erin, no available appts and unable to work pt in.

## 2023-12-04 ENCOUNTER — Ambulatory Visit (HOSPITAL_COMMUNITY)
Admission: RE | Admit: 2023-12-04 | Discharge: 2023-12-04 | Disposition: A | Discharge status: Home / Self Care | Source: Ambulatory Visit | Attending: Vascular Surgery | Admitting: Vascular Surgery

## 2023-12-04 DIAGNOSIS — I712 Thoracic aortic aneurysm, without rupture, unspecified: Secondary | ICD-10-CM | POA: Insufficient documentation

## 2023-12-04 MED ORDER — IOHEXOL 350 MG/ML SOLN
75.0000 mL | Freq: Once | INTRAVENOUS | Status: AC | PRN
  Administered 2023-12-04: 75 mL via INTRAVENOUS

## 2023-12-04 NOTE — Telephone Encounter (Signed)
 Hi Carla/Dr. Geofm  I spoke with April @[facility]. I gave instructions per Dr. Geofm in regards to wearing compression sock on Rt. Leg. April wasn't sure so called and spoke with Ms. Eveline. She stated that she wears her compression socks only during the day but takes them off at night. Wanted to know if this ok?  April did not know if she had an abdominal binder or if she was using it. When I called I also asked Ms. Mcginnis about the abdominal binder but she stated that she does not have one.   Informed April about rx for Metoprolol  to help with the drop in pt's BP. April asked if Ms. Litzinger is to take the Metoprolol  along with Fludrocortisone  0.1mg  daily?? She was given this medication following her Hospital Visit and Rehab.  All other BP meds were stopped during this time.  Please advise. April may be reached at [phone]  Thanks!  Kimbly Eanes

## 2023-12-04 NOTE — Telephone Encounter (Signed)
 We can try tizanidine for muscle spasms and see if that works and get rid of the other 2 she is currently taking.  Any muscle relaxant can cause drowsiness so that is a concern because it increases the risk of falling.  She needs to be very cautious with the muscle relaxers.  If she does not need the oxycodone  I think she should not take anymore and just stick with Tylenol  and try the muscle relaxer-let me know what she wants to do.

## 2023-12-05 ENCOUNTER — Other Ambulatory Visit (INDEPENDENT_AMBULATORY_CARE_PROVIDER_SITE_OTHER): Admitting: Pharmacist

## 2023-12-05 ENCOUNTER — Telehealth: Payer: Self-pay

## 2023-12-05 ENCOUNTER — Encounter: Payer: Self-pay | Admitting: Internal Medicine

## 2023-12-05 ENCOUNTER — Ambulatory Visit: Payer: Self-pay

## 2023-12-05 DIAGNOSIS — E1169 Type 2 diabetes mellitus with other specified complication: Secondary | ICD-10-CM

## 2023-12-05 MED ORDER — DULOXETINE HCL 30 MG PO CPEP: 60.0000 mg | ORAL_CAPSULE | Freq: Every day | ORAL | 0 refills | Status: DC

## 2023-12-05 MED ORDER — MOUNJARO 5 MG/0.5ML ~~LOC~~ SOAJ: 5.0000 mg | SUBCUTANEOUS | 0 refills | Status: DC

## 2023-12-05 MED ORDER — TIZANIDINE HCL 2 MG PO CAPS: 2.0000 mg | ORAL_CAPSULE | Freq: Three times a day (TID) | ORAL | 2 refills | Status: DC

## 2023-12-05 NOTE — Telephone Encounter (Signed)
 Copied from CRM [id number]. Topic: Clinical - Medical Advice >> Dec 05, 2023  1:55 PM Pinkey ORN wrote: Reason for CRM: Medical Advice >> Dec 05, 2023  1:56 PM Pinkey ORN wrote: Patient called in unsure of rather or not she's suppose to be taking both metoprolol  succinate (TOPROL -XL) 25 MG 24 hr tablet and fludrocortisone  (FLORINEF ) 0.1 MG tablet together.. Please follow up with patient

## 2023-12-05 NOTE — Telephone Encounter (Signed)
 Duplicate

## 2023-12-05 NOTE — Patient Instructions (Signed)
 It was a pleasure speaking with you today!  STOP fludrocortisone  Take amlodipine  5 mg today to help lower BP until appointment tomorrow.  I have sent needed refills.  Feel free to call with any questions or concerns!  Darrelyn Drum, PharmD, BCPS, CPP Clinical Pharmacist Practitioner [facility] [phone]

## 2023-12-05 NOTE — Telephone Encounter (Signed)
 Patient reports continual increased blood pressure readings. Patient was recently started on Metoprolol  12.5mg . patient reports still having high readings. Patient is recommended to be seen tomorrow. Appointment scheduled for tomorrow at 9:40 AM with another provider in office.   FYI Only or Action Required?: FYI only for provider.  Patient was last seen in primary care on 11/22/2023 by Geofm Glade PARAS, MD.  Called Nurse Triage reporting Hypertension.  Symptoms began several days ago.  Interventions attempted: Prescription medications: Metoprolol  and Rest, hydration, or home remedies.  Symptoms are: unchanged.  Triage Disposition: See Physician Within 24 Hours  Patient/caregiver understands and will follow disposition?: Yes  Copied from CRM [id number]. Topic: Clinical - Red Word Triage >> Dec 05, 2023  1:57 PM Pinkey ORN wrote: Red Word that prompted transfer to Nurse Triage: Elevated Blood Pressure 190/110 Reason for Disposition  Systolic BP >= 180 OR Diastolic >= 110  Answer Assessment - Initial Assessment Questions 1. BLOOD PRESSURE: What is your blood pressure? Did you take at least two measurements 5 minutes apart?     190/110,   2. ONSET: When did you take your blood pressure?     Today 3. HOW: How did you take your blood pressure? (e.g., automatic home BP monitor, visiting nurse)     Visiting PT/OT 4. HISTORY: Do you have a history of high blood pressure?     yes 5. MEDICINES: Are you taking any medicines for blood pressure? Have you missed any doses recently?     Yes was recently started on metoprolol  6. OTHER SYMPTOMS: Do you have any symptoms? (e.g., blurred vision, chest pain, difficulty breathing, headache, weakness)     no  Protocols used: Blood Pressure - High-A-AH

## 2023-12-05 NOTE — Progress Notes (Signed)
 10/9/Powell Name: Kerry Powell MRN: [id number] DOB: 01/03/1946  Chief Complaint  Patient presents with   Medication Management    Kerry Powell is a 78 y.o. year old female who presented for a telephone visit.   They were referred to the pharmacist by the [facility] Case Management program for assistance in managing med review.    Subjective:  Care Team: Primary Care Provider: Geofm Kerry PARAS, Powell ; Next Scheduled Visit: 10//13/25   Medication Access/Adherence  Current Pharmacy:  [facility] [address] ([address]), [place] - [address] [place] ([place]) [name] [address] Phone: [phone] Fax: [phone]   Patient reports affordability concerns with their medications: No  Patient reports access/transportation concerns to their pharmacy: No  Patient reports adherence concerns with their medications:  No    Reviewed medications with patient. She notes she needs duloxetine  and Mounjaro  refills. Pt also asks if she should be taking both fludrocortisone  and metoprolol . Pt takes potassium but notes they are hard for her to swallow due to the size and almost choked on one to the point she could not breath.  Just prior to scheduled appt, pt called triage line for elevated BP of 190/111. Pt notes light headache that goes away with resting. She notes her BP has been slowly increasing each day with recent readings:  BP today 190/111, 187/100  Yesterday highest 183/118 11 PM, 6:45 pm 179/108, 182/118, pulse 100 10/7 170/114 10/6 168/108  Pt notes she was on 4 BP medications prior to her hospitalization and stay at [facility].   Pt also reports significant issues with uncontrolled pain in her leg. She was on oxycodone  but ran out a week ago. She has been taking APAP  500 mg 2 tablets 1-2times per day and methocarbamol  which are not effective at controlling her pain. She notes she has asked about an alternative but has not heard back yet.   Objective:  Lab  Results  Component Value Date   HGBA1C 6.4 (H) 08/11/Powell    Lab Results  Component Value Date   CREATININE 1.32 (H) 09/26/Powell   BUN 8 09/26/Powell   NA 137 09/26/Powell   K 3.2 (L) 09/26/Powell   CL 100 09/26/Powell   CO2 31 09/26/Powell    Lab Results  Component Value Date   CHOL 119 04/09/Powell   HDL 41.90 04/09/Powell   LDLCALC 57 04/09/Powell   LDLDIRECT 79.0 08/01/2020   TRIG 101.0 04/09/Powell   CHOLHDL 3 04/09/Powell    Medications Reviewed Today     Reviewed by Kerry Powell, RPH (Pharmacist) on 12/05/23 at 1625  Med List Status: <None>   Medication Order Taking? Sig Documenting Provider Last Dose Status Informant  albuterol  (PROVENTIL ) (2.5 MG/3ML) 0.083% nebulizer solution [id number]  USE 1 VIAL IN NEBULIZER EVERY 6 HOURS AND AS NEEDED FOR SHORT BREATH/WHEEZE.  Generic:  Ventolin  Kerry Powell  Active Self, Pharmacy Records, Other  allopurinol  (ZYLOPRIM ) 300 MG tablet [id number] Yes Take 1 tablet by mouth once daily Kerry Powell  Active Self, Pharmacy Records, Other  bisacodyl  (DULCOLAX) 10 MG suppository [id number]  Place 1 suppository (10 mg total) rectally daily as needed for moderate constipation.  Patient not taking: Reported on 10/9/Powell   Kerry Powell  Active   budesonide -formoterol  (SYMBICORT ) 160-4.5 MCG/ACT inhaler [id number] Yes Inhale 2 puffs into the lungs 2 (two) times daily. Powell, Kerry HERO, Powell  Active Self, Pharmacy Records, Other  cetirizine  (ZYRTEC  ALLERGY) 10 MG tablet  [id number]  Take 10 mg by mouth daily as needed for allergies or rhinitis. Provider, Historical, Powell  Active Self, Pharmacy Records, Other           Med Note Kerry Powell   Sun Aug 10, Powell  8:30 PM)    cholecalciferol (VITAMIN D3) 25 MCG (1000 UNIT) tablet [id number] Yes Take 2,000 Units by mouth daily. Provider, Historical, Powell  Active Self, Pharmacy Records, Other  clopidogrel  (PLAVIX ) 75 MG tablet [id number] Yes Take 1 tablet by mouth once daily Kerry Powell  Active Self,  Pharmacy Records, Other  DULoxetine  (CYMBALTA ) 30 MG capsule [id number] Yes Take 2 capsules (60 mg total) by mouth daily. Kerry Powell  Active Self, Pharmacy Records, Other           Med Note Kerry Powell   Kerry Powell  8:42 PM) Patient has been unable to find medication at home, has been without for several weeks  FARXIGA  10 MG TABS tablet [id number] Yes Take 1 tablet (10 mg total) by mouth daily. Take 10 mg by mouth daily. Kerry Powell  Active Self, Pharmacy Records, Other  ferrous sulfate  325 (65 FE) MG tablet [id number] Yes Take 325 mg by mouth every other day. Provider, Historical, Powell  Active Self, Pharmacy Records, Other  fluticasone  (FLONASE ) 50 MCG/ACT nasal spray [id number] Yes Place 2 sprays into both nostrils daily. Powell, Kerry Powell  Active Self, Pharmacy Records, Other  furosemide  (LASIX ) 20 MG tablet [id number] Yes Take 1 tablet by mouth once daily Kerry Powell  Active   metoprolol  succinate (TOPROL -XL) 25 MG 24 hr tablet [id number] Yes Take 0.5 tablets (12.5 mg total) by mouth daily. Kerry Powell  Active   montelukast  (SINGULAIR ) 10 MG tablet [id number] Yes TAKE 1 TABLET BY MOUTH ONCE DAILY AT BEDTIME Kerry Powell  Active Self, Pharmacy Records, Other  MOUNJARO  5 MG/0.5ML Pen [id number] Yes INJECT 1/2 (ONE-HALF) ML SUBCUTANEOUSLY  ONCE A WEEK Kerry Powell  Active Self, Pharmacy Records, Other  pantoprazole  (PROTONIX ) 40 MG tablet [id number] Yes Take 1 tablet (40 mg total) by mouth 2 (two) times daily before a meal. Kerry Powell  Active Self, Pharmacy Records, Other  polyethylene glycol (MIRALAX  / GLYCOLAX ) 17 g packet [id number] Yes Take 17 g by mouth 2 (two) times daily. Kerry Powell  Active   potassium chloride  SA (KLOR-CON  M) 20 MEQ tablet [id number] Yes Take 1 tablet (20 mEq total) by mouth daily. Kerry Powell  Active   rosuvastatin  (CRESTOR ) 40 MG tablet [id number] Yes Take 1 tablet by mouth once daily Powell, Kerry SAUNDERS, Powell  Active  Self, Pharmacy Records, Other  sodium chloride  (OCEAN) 0.65 % SOLN nasal spray [id number]  Place 1 spray into both nostrils as needed for congestion. Kerry Powell  Active Self, Pharmacy Records, Other  tiotropium (SPIRIVA  HANDIHALER) 18 MCG inhalation capsule [id number] Yes Place 1 capsule (18 mcg total) into inhaler and inhale daily. Raenelle Kerry HERO, Powell  Active Self, Pharmacy Records, Other  tizanidine (ZANAFLEX) 2 MG capsule [id number]  Take 1-2 capsules (2-4 mg total) by mouth 3 (three) times daily. Kerry Powell  Active               Assessment/Plan:   Sent refills of Mounjaro  and duloxetine  for 1 month supply, sees PCP next week.  Regarding elevated BP - pt has acute visit scheduled for tomorrow at 2:40 PM. Advised pt  to STOP fludrocortisone  (has not taken today yet). Pt used to be on amlodipine  5 mg daily and telmisartan  prior to hospitalization. She still has supply at home. Recommended patient take amlodipine  5 mg daily today to try to lower elevated BP until appt tomorrow.  Reviewed Powell/sx of CVA and warning signs that may warrant ED visit given very elevated BP  Regarding potassium tablets - can get potassium capsule 10 mEQ to take 2 tablets daily for $0. May be easier to swallow OR have the ability to open capsule to pour powder into applesauce if needed. Recommend updating BMP to check K. If potassium supplementation still needed, send potassium chloride  CR 10 mEQ capsule.  Advised pt to mention uncontrolled pain at appt tomorrow as this may be contributing to elevated BP. PCP wanting to avoid long term opioid use. Consider tramadol  or other pain medication to bridge to upcoming PCP f/u. Cannot take NSAIDs due to Plavix . Counseled patient that she can take APAP 500 mg 2 tablets up to TID for pain.   Follow Up Plan: Await acute visit tomorrow and PCP next week to determine follow up needed  Darrelyn Drum, PharmD, BCPS, CPP Clinical Pharmacist Practitioner [place]  [facility] at [facility] [phone]

## 2023-12-05 NOTE — Addendum Note (Signed)
 Addended by: GEOFM GLADE PARAS on: 12/05/2023 03:58 PM   Modules accepted: Orders

## 2023-12-05 NOTE — Telephone Encounter (Signed)
 noted

## 2023-12-06 ENCOUNTER — Ambulatory Visit: Admitting: Family Medicine

## 2023-12-06 ENCOUNTER — Other Ambulatory Visit: Payer: Self-pay

## 2023-12-06 ENCOUNTER — Encounter: Payer: Self-pay | Admitting: Internal Medicine

## 2023-12-06 DIAGNOSIS — E1169 Type 2 diabetes mellitus with other specified complication: Secondary | ICD-10-CM

## 2023-12-06 MED ORDER — BLOOD GLUCOSE TEST VI STRP: ORAL_STRIP | 11 refills | Status: AC

## 2023-12-06 MED ORDER — LANCET DEVICE MISC: 11 refills | Status: AC

## 2023-12-06 MED ORDER — BLOOD GLUCOSE MONITORING SUPPL DEVI: 0 refills | Status: AC

## 2023-12-06 NOTE — Progress Notes (Deleted)
   Acute Office Visit  Subjective:     Patient ID: Kerry Powell, female    DOB: [birth year], 78 y.o.   MRN: [id number]  No chief complaint on file.   HPI  Discussed the use of AI scribe software for clinical note transcription with the patient, who gave verbal consent to proceed.  History of Present Illness      ROS Per HPI      Objective:    There were no vitals taken for this visit.   Physical Exam Vitals and nursing note reviewed.  Constitutional:      General: She is not in acute distress.    Appearance: Normal appearance. She is normal weight.  HENT:     Head: Normocephalic and atraumatic.     Right Ear: External ear normal.     Left Ear: External ear normal.     Nose: Nose normal.     Mouth/Throat:     Mouth: Mucous membranes are moist.     Pharynx: Oropharynx is clear.  Eyes:     Extraocular Movements: Extraocular movements intact.     Pupils: Pupils are equal, round, and reactive to light.  Cardiovascular:     Rate and Rhythm: Normal rate and regular rhythm.     Pulses: Normal pulses.     Heart sounds: Normal heart sounds.  Pulmonary:     Effort: Pulmonary effort is normal. No respiratory distress.     Breath sounds: Normal breath sounds. No wheezing, rhonchi or rales.  Musculoskeletal:        General: Normal range of motion.     Cervical back: Normal range of motion.     Right lower leg: No edema.     Left lower leg: No edema.  Lymphadenopathy:     Cervical: No cervical adenopathy.  Neurological:     General: No focal deficit present.     Mental Status: She is alert and oriented to person, place, and time.  Psychiatric:        Mood and Affect: Mood normal.        Thought Content: Thought content normal.    No results found for any visits on 12/06/23.      Assessment & Plan:   Assessment and Plan Assessment & Plan      No orders of the defined types were placed in this encounter.    No orders of the defined types were placed  in this encounter.   No follow-ups on file.  Corean LITTIE Ku, FNP

## 2023-12-08 ENCOUNTER — Other Ambulatory Visit: Payer: Self-pay | Admitting: Internal Medicine

## 2023-12-08 DIAGNOSIS — R42 Dizziness and giddiness: Secondary | ICD-10-CM

## 2023-12-08 NOTE — Patient Instructions (Addendum)
      Blood work was ordered.       Medications changes include :   muscle relaxer -- methocarbamol  500 mg every 8 hours as needed.  Start fosamax  once a week.       Return in 6 months (on 04/08/2024) for Physical Exam.

## 2023-12-08 NOTE — Progress Notes (Unsigned)
 Subjective:    Patient ID: Kerry Powell, female    DOB: June 24, 1945, 78 y.o.   MRN: [id number]     HPI Kerry Powell is here for follow up of her chronic medical problems.  BP - BP this morning sitting 128/66,  standing 86/66 - gets lightheaded - majority of times she stands she feels   Left femur fx s/p repair  with pain, muscle spasms   Sees Ortho on Wednesday.    Using biofreeze, tylenol , muscle relaxers.  Pain in groin and lateral hip.  When she stretches the leg out is shakes.  Feels like a grabbing pain in the groin/hip area and shoots down the leg.  She has n/t in the leg.  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    With sitting pain is 5/10,  not able to stand up - she will fall.  PT and OT is having a hard time getting her up - she is able to walk a few steps with a belt.  Yesterday she was in bed all day long.  Pain has not gotten better since surgery.     Medications and allergies reviewed with patient and updated if appropriate.  Current Outpatient Medications on File Prior to Visit  Medication Sig Dispense Refill   albuterol  (PROVENTIL ) (2.5 MG/3ML) 0.083% nebulizer solution USE 1 VIAL IN NEBULIZER EVERY 6 HOURS AND AS NEEDED FOR SHORT BREATH/WHEEZE.  Generic:  Ventolin  75 mL 2   allopurinol  (ZYLOPRIM ) 300 MG tablet Take 1 tablet by mouth once daily 90 tablet 0   bisacodyl  (DULCOLAX) 10 MG suppository Place 1 suppository (10 mg total) rectally daily as needed for moderate constipation. 12 suppository 0   Blood Glucose Monitoring Suppl DEVI May substitute to any manufacturer covered by patient's insurance. Patient request Accu-check meter and testing suppliles 1 kit 0   budesonide -formoterol  (SYMBICORT ) 160-4.5 MCG/ACT inhaler Inhale 2 puffs into the lungs 2 (two) times daily. 1 each 12   cetirizine  (ZYRTEC  ALLERGY) 10 MG tablet Take 10 mg by mouth daily as needed for allergies or rhinitis.     cholecalciferol (VITAMIN D3) 25 MCG (1000 UNIT) tablet Take 2,000 Units by mouth daily.      clopidogrel  (PLAVIX ) 75 MG tablet Take 1 tablet by mouth once daily 90 tablet 0   DULoxetine  (CYMBALTA ) 30 MG capsule Take 2 capsules (60 mg total) by mouth daily. 60 capsule 0   FARXIGA  10 MG TABS tablet Take 1 tablet (10 mg total) by mouth daily. Take 10 mg by mouth daily. 90 tablet 3   ferrous sulfate  325 (65 FE) MG tablet Take 325 mg by mouth every other day.     fluticasone  (FLONASE ) 50 MCG/ACT nasal spray Place 2 sprays into both nostrils daily. 16 g 6   furosemide  (LASIX ) 20 MG tablet Take 1 tablet by mouth once daily 90 tablet 0   Glucose Blood (BLOOD GLUCOSE TEST STRIPS) STRP May substitute to any manufacturer covered by patient's insurance. Patient to test glucose 2-3 times daily as needed 200 strip 11   Lancet Device MISC May substitute to any manufacturer covered by patient's insurance.  Patient to test glucose 2-3 times daily as needed 1 each 11   levocetirizine (XYZAL ) 5 MG tablet SMARTSIG:1 Tablet(s) By Mouth Every Evening     metoprolol  succinate (TOPROL -XL) 25 MG 24 hr tablet Take 0.5 tablets (12.5 mg total) by mouth daily. 15 tablet 3   montelukast  (SINGULAIR ) 10 MG tablet TAKE 1 TABLET BY MOUTH ONCE  DAILY AT BEDTIME 30 tablet 5   pantoprazole  (PROTONIX ) 40 MG tablet Take 1 tablet (40 mg total) by mouth 2 (two) times daily before a meal. 180 tablet 1   polyethylene glycol (MIRALAX  / GLYCOLAX ) 17 g packet Take 17 g by mouth 2 (two) times daily. 14 each 0   potassium chloride  SA (KLOR-CON  M) 20 MEQ tablet Take 1 tablet (20 mEq total) by mouth daily. 90 tablet 3   rosuvastatin  (CRESTOR ) 40 MG tablet Take 1 tablet by mouth once daily 90 tablet 3   sodium chloride  (OCEAN) 0.65 % SOLN nasal spray Place 1 spray into both nostrils as needed for congestion. 480 mL 1   tiotropium (SPIRIVA  HANDIHALER) 18 MCG inhalation capsule Place 1 capsule (18 mcg total) into inhaler and inhale daily. 30 capsule 2   tirzepatide  (MOUNJARO ) 5 MG/0.5ML Pen Inject 5 mg into the skin once a week. 2 mL 0    tizanidine (ZANAFLEX) 2 MG capsule Take 1-2 capsules (2-4 mg total) by mouth 3 (three) times daily. 100 capsule 2   No current facility-administered medications on file prior to visit.     Review of Systems  Constitutional:  Negative for fever.  Respiratory:  Negative for shortness of breath (has been sedentary).   Cardiovascular:  Positive for chest pain (grabbing at times). Negative for palpitations.  Neurological:  Positive for light-headedness. Negative for headaches.  Psychiatric/Behavioral:  The patient is nervous/anxious.        Objective:   Vitals:   12/09/23 1414  BP: 126/80  Pulse: 89  Temp: 98.4 F (36.9 C)  SpO2: 96%   BP Readings from Last 3 Encounters:  12/09/23 126/80  11/22/23 126/72  10/31/23 100/71   Wt Readings from Last 3 Encounters:  12/09/23 187 lb (84.8 kg)  11/22/23 190 lb (86.2 kg)  10/30/23 205 lb 0.4 oz (93 kg)   Body mass index is 31.12 kg/m.    Physical Exam Constitutional:      General: She is not in acute distress.    Appearance: Normal appearance.  HENT:     Head: Normocephalic and atraumatic.  Eyes:     Conjunctiva/sclera: Conjunctivae normal.  Cardiovascular:     Rate and Rhythm: Normal rate and regular rhythm.     Heart sounds: Normal heart sounds.  Pulmonary:     Effort: Pulmonary effort is normal. No respiratory distress.     Breath sounds: Normal breath sounds. No wheezing.  Musculoskeletal:     Cervical back: Neck supple.     Right lower leg: No edema.     Left lower leg: No edema.  Lymphadenopathy:     Cervical: No cervical adenopathy.  Skin:    General: Skin is warm and dry.     Findings: No rash.  Neurological:     Mental Status: She is alert. Mental status is at baseline.  Psychiatric:        Mood and Affect: Mood normal.        Behavior: Behavior normal.        Lab Results  Component Value Date   WBC 7.0 11/22/2023   HGB 12.9 11/22/2023   HCT 41.3 11/22/2023   PLT 323.0 11/22/2023   GLUCOSE 102  (H) 11/22/2023   CHOL 119 06/05/2023   TRIG 101.0 06/05/2023   HDL 41.90 06/05/2023   LDLDIRECT 79.0 08/01/2020   LDLCALC 57 06/05/2023   ALT 13 11/22/2023   AST 20 11/22/2023   NA 137 11/22/2023   K 3.2 (L)  11/22/2023   CL 100 11/22/2023   CREATININE 1.32 (H) 11/22/2023   BUN 8 11/22/2023   CO2 31 11/22/2023   TSH 1.706 10/07/2023   INR 1.0 10/21/2023   HGBA1C 6.4 (H) 10/07/2023   MICROALBUR 0.9 06/05/2023     Assessment & Plan:    See Problem List for Assessment and Plan of chronic medical problems.

## 2023-12-09 ENCOUNTER — Ambulatory Visit: Attending: Internal Medicine

## 2023-12-09 ENCOUNTER — Ambulatory Visit: Admitting: Internal Medicine

## 2023-12-09 ENCOUNTER — Encounter: Payer: Self-pay | Admitting: Internal Medicine

## 2023-12-09 VITALS — BP 126/80 | HR 89 | Temp 98.4°F | Ht 65.0 in | Wt 187.0 lb

## 2023-12-09 DIAGNOSIS — E1159 Type 2 diabetes mellitus with other circulatory complications: Secondary | ICD-10-CM | POA: Diagnosis not present

## 2023-12-09 DIAGNOSIS — R42 Dizziness and giddiness: Secondary | ICD-10-CM

## 2023-12-09 DIAGNOSIS — Z8781 Personal history of (healed) traumatic fracture: Secondary | ICD-10-CM

## 2023-12-09 DIAGNOSIS — I152 Hypertension secondary to endocrine disorders: Secondary | ICD-10-CM | POA: Diagnosis not present

## 2023-12-09 MED ORDER — TRAMADOL HCL 50 MG PO TABS: 50.0000 mg | ORAL_TABLET | Freq: Three times a day (TID) | ORAL | 0 refills | Status: DC | PRN

## 2023-12-09 NOTE — Assessment & Plan Note (Signed)
 History of left femur fracture S/p repair Having persistent pain - 5/10 when sitting and much more when moving Can only get up with assistance and needs help ambulating Pain has not improved since surgery Pain is not controlled Start tramadol  50-100 mg tid - take Tylenol  with tramadol  - max 3000 mg / day Take tizanidine only if it helps Doing PT, OT at home To see ortho in two days - does not seem to be progressing as she should be at this stage

## 2023-12-09 NOTE — Progress Notes (Unsigned)
 EP to read.

## 2023-12-09 NOTE — Assessment & Plan Note (Signed)
 Chronic Blood pressure has been controlled but on the low side when standing and has some lightheadedness Currently taking metoprolol  25 mg twice daily - advised she should be taking 12.5 mg daily -- decrease to 1/2 pill Monitor BP at home

## 2023-12-11 ENCOUNTER — Encounter: Payer: Self-pay | Admitting: Internal Medicine

## 2023-12-12 ENCOUNTER — Encounter: Payer: Self-pay | Admitting: Vascular Surgery

## 2023-12-12 ENCOUNTER — Ambulatory Visit: Attending: Vascular Surgery | Admitting: Vascular Surgery

## 2023-12-12 VITALS — BP 106/75 | HR 77 | Temp 98.3°F | Resp 22 | Ht 65.0 in | Wt 187.0 lb

## 2023-12-12 DIAGNOSIS — I712 Thoracic aortic aneurysm, without rupture, unspecified: Secondary | ICD-10-CM | POA: Diagnosis not present

## 2023-12-12 DIAGNOSIS — I700 Atherosclerosis of aorta: Secondary | ICD-10-CM | POA: Diagnosis not present

## 2023-12-12 NOTE — Progress Notes (Signed)
 Office Note     CC: Kerry Powell Requesting Provider:  Geofm Glade PARAS, MD  HPI: Kerry Powell is a 78 y.o. (November 27, 1945) female presenting at the request of .Kerry Glade PARAS, MD for evaluation of Kerry Powell.  On exam, Kerry Powell was sitting comfortably in a wheelchair, accompanied by her son.  A native of [place], she continues to live in [employer] garden.  She has been seen by our practice previously, and evaluating notes, has also been seen at [facility] by Dr. Teddie for small thoracic aortic aneurysm, chronic left renal artery occlusion.  In our office, we were following Kerry Powell which was being treated with antiplatelet, statin therapy.  Kerry Powell was recently seen in the hospital and noted to have progression of the mural thrombus, specifically in the descending thoracic Powell.  She presents today to discuss these findings.  Kerry Powell denies recent history of chest pain, back pain, abdominal pain. She notes severe left hip pain, knee pain, and states both need to be replaced, but is unsure if she is healthy enough to undergo operations.  Denies history of TIA, stroke, amaurosis.  Does have some dizziness when standing, which appears to be most consistent with orthostatic hypotension.   Past Medical History:  Diagnosis Date   Allergy    Anemia    Anxiety    Arthritis    BACK PAIN, CHRONIC 04/04/2007   Qualifier: Diagnosis of  By: [name], [name]    BIPOLAR AFFECTIVE DISORDER 04/04/2007   Qualifier: History of  By: [name], [name] E    CAD (coronary artery disease), native coronary artery    mild CAD with no obstructive disease by coronary CTA 06/2019   Cataract    removed both eyes   CHF (congestive heart failure) (HCC)    Chronic neck pain 04/30/2015   Cervical spondylosis [facility] Intermittent numbness and tingling in occipital region and headaches S/p 2 occipital nerve blocks - only temp relief no relief with gabapentin,  ultram , brace MRI neck - facet arthrosis    CKD (chronic kidney disease) 04/26/2015   COPD (chronic obstructive pulmonary disease) (HCC)    pt denies - told this while was smoking -   Cough 04/26/2015   Depression 07/01/2017   Diabetes (HCC) 04/27/2015   Diagnosed 03/2015    Dizziness 06/10/2017   Dysphagia 04/26/2015   DYSPNEA 12/24/2007   Qualifier: Diagnosis of  By: [name]     Elevated TSH 08/09/2015   GASTRIC ULCER, ACUTE 04/07/2007   Annotation: EGD 04/07/07 Qualifier: Diagnosis of  By: [name], [name] E    GASTRITIS 04/07/2007   Annotation: EGD 04/07/07 Qualifier: Diagnosis of  By: [name], [name] E    GERD (gastroesophageal reflux disease)    H/O renal cell carcinoma 05/23/2017   Dx 2010, s/p cryoablation Follows with urology - Dr [name]   HIATAL HERNIA 04/07/2007   Annotation: EGD 04/07/07 Qualifier: Diagnosis of  By: [name], [name] E    History of CVA (cerebrovascular accident) 04/26/2015   History of gout 04/26/2015   Hyperlipidemia    Hypertension    Hypoxia 03/06/2023   Irritable bowel syndrome 04/04/2007   Qualifier: Diagnosis of  By: [name], [name]    Kidney tumor (benign)    Nausea and vomiting 09/09/2017   Occipital neuralgia 05/09/2016   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 Dr [name] in [place]   OSTEOARTHRITIS 04/04/2007   Qualifier: Diagnosis of  By:  [name], NOLIA Pitts E    Personal history of colonic adenoma 06/07/2012   POSTNASAL DRIP SYNDROME 12/29/2007   Qualifier: Diagnosis of  By: [name], [name]     RESTLESS LEG SYNDROME 04/04/2007   Qualifier: Diagnosis of  By: [name], NOLIA Pitts BRAVO    RHINOSINUSITIS, 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   Qualifier: Diagnosis of  By: [name], NOLIA Pitts BRAVO    Sleep difficulties 09/09/2017   Snoring 04/26/2015   Stroke (HCC)    x2 2011   Thoracic aortic aneurysm 02/06/2018   Seen on MRI 01/25/18 of thoracic spine ordered by [facility] -- referred to CTS to monitor  --  3.5 cm   TIA (transient ischemic attack)    TOBACCO ABUSE 12/17/2007   Qualifier: Diagnosis of  By: [name], [name]     Trigeminal neuralgia of right side of face 02/08/2016   URI (upper respiratory infection) 02/08/2016   Vitamin D  deficiency 03/12/2018    Past Surgical History:  Procedure Laterality Date   ABDOMINAL HYSTERECTOMY     APPENDECTOMY     APPLICATION OF WOUND VAC Left 10/09/2023   Procedure: APPLICATION, WOUND VAC;  Surgeon: [name], MD;  Location: [facility] OR;  Service: Orthopedics;  Laterality: Left;   CATARACT EXTRACTION     COLONOSCOPY     CYSTOCELE REPAIR     ESOPHAGOGASTRODUODENOSCOPY     FRACTURE SURGERY  2007   INCISION AND DRAINAGE OF DEEP ABSCESS, CALF Left 10/09/2023   Procedure: INCISION AND DRAINAGE OF DEEP ABSCESS, CALF;  Surgeon: [name], MD;  Location: [facility] OR;  Service: Orthopedics;  Laterality: Left;   INTRAMEDULLARY (IM) NAIL INTERTROCHANTERIC Left 10/24/2023   Procedure: FIXATION, FRACTURE, INTERTROCHANTERIC, WITH INTRAMEDULLARY ROD;  Surgeon: [name], MD;  Location: [facility] OR;  Service: Orthopedics;  Laterality: Left;   KIDNEY SURGERY     benign tumor removal   ORTHOPEDIC SURGERY     Wrist & elbow   POLYPECTOMY     RECTOCELE REPAIR     TONSILECTOMY, ADENOIDECTOMY, BILATERAL MYRINGOTOMY AND TUBES Bilateral 1968   UPPER GASTROINTESTINAL ENDOSCOPY      Social History   Socioeconomic History   Marital status: Married    Spouse name: Not on file   Number of children: 2   Years of education: Not on file   Highest education level: 12th grade  Occupational History   Occupation: Retired  Tobacco Use   Smoking status: Former    Current packs/day: 0.00    Average packs/day: 1 pack/day for 46.0 years (46.0 ttl pk-yrs)    Types: Cigarettes    Start date: 07/01/1963    Quit date: 06/30/2009    Years since quitting: 14.4    Passive exposure: Past   Smokeless tobacco: Never  Vaping Use   Vaping status: Never Used  Substance and Sexual  Activity   Alcohol  use: No   Drug use: No   Sexual activity: Not Currently  Other Topics Concern   Not on file  Social History Narrative   Married, retired   1 son 1 daughter   2 caffeine/day   2 story home, but doesn't have to use stairs - son stays upstairs   Social Drivers of Health   Financial Resource Strain: Low Risk  (12/08/2023)   Overall Financial Resource Strain (CARDIA)    Difficulty of Paying Living Expenses: Not very hard  Food Insecurity: No Food Insecurity (12/08/2023)   Hunger Vital Sign    Worried About Running Out of  Food in the Last Year: Never true    Ran Out of Food in the Last Year: Never true  Transportation Needs: No Transportation Needs (12/08/2023)   PRAPARE - Administrator, Civil Service (Medical): No    Lack of Transportation (Non-Medical): No  Physical Activity: Inactive (12/08/2023)   Exercise Vital Sign    Days of Exercise per Week: 0 days    Minutes of Exercise per Session: Not on file  Stress: Stress Concern Present (12/08/2023)   [employer] of Occupational Health - Occupational Stress Questionnaire    Feeling of Stress: To some extent  Social Connections: Socially Isolated (12/08/2023)   Social Connection and Isolation Panel    Frequency of Communication with Friends and Family: Once a week    Frequency of Social Gatherings with Friends and Family: Never    Attends Religious Services: Never    Database administrator or Organizations: No    Attends Engineer, structural: Not on file    Marital Status: Married  Catering manager Violence: Not At Risk (11/20/2023)   Humiliation, Afraid, Rape, and Kick questionnaire    Fear of Current or Ex-Partner: No    Emotionally Abused: No    Physically Abused: No    Sexually Abused: No   Family History  Problem Relation Age of Onset   Congestive Heart Failure Mother    Cancer Mother        ovarian   Asthma Mother    Heart disease Mother    Heart disease Father     Diabetes Father    Kidney disease Father    Hyperlipidemia Father    Stroke Father    Diabetes Sister    Kidney disease Sister    Miscarriages / [place] Sister    Arthritis Maternal Grandmother    Hyperlipidemia Maternal Grandmother    Hypertension Maternal Grandmother    Obesity Maternal Grandmother    Arthritis Paternal Grandfather    Hypertension Paternal Grandfather    Kidney disease Paternal Grandfather    Diabetes Paternal Aunt    Diabetes Paternal Uncle    Learning disabilities Brother    Anxiety disorder Son    Diabetes Son    Learning disabilities Son    Depression Maternal Aunt    Colon cancer Neg Hx    Colon polyps Neg Hx    Esophageal cancer Neg Hx    Rectal cancer Neg Hx    Stomach cancer Neg Hx     Current Outpatient Medications  Medication Sig Dispense Refill   albuterol  (PROVENTIL ) (2.5 MG/3ML) 0.083% nebulizer solution USE 1 VIAL IN NEBULIZER EVERY 6 HOURS AND AS NEEDED FOR SHORT BREATH/WHEEZE.  Generic:  Ventolin  75 mL 2   allopurinol  (ZYLOPRIM ) 300 MG tablet Take 1 tablet by mouth once daily 90 tablet 0   bisacodyl  (DULCOLAX) 10 MG suppository Place 1 suppository (10 mg total) rectally daily as needed for moderate constipation. 12 suppository 0   Blood Glucose Monitoring Suppl DEVI May substitute to any manufacturer covered by patient's insurance. Patient request Accu-check meter and testing suppliles 1 kit 0   budesonide -formoterol  (SYMBICORT ) 160-4.5 MCG/ACT inhaler Inhale 2 puffs into the lungs 2 (two) times daily. 1 each 12   cetirizine  (ZYRTEC  ALLERGY) 10 MG tablet Take 10 mg by mouth daily as needed for allergies or rhinitis.     cholecalciferol (VITAMIN D3) 25 MCG (1000 UNIT) tablet Take 2,000 Units by mouth daily.     clopidogrel  (PLAVIX ) 75 MG tablet Take  1 tablet by mouth once daily 90 tablet 0   DULoxetine  (CYMBALTA ) 30 MG capsule Take 2 capsules (60 mg total) by mouth daily. 60 capsule 0   FARXIGA  10 MG TABS tablet Take 1 tablet (10 mg total)  by mouth daily. Take 10 mg by mouth daily. 90 tablet 3   ferrous sulfate  325 (65 FE) MG tablet Take 325 mg by mouth every other day.     fluticasone  (FLONASE ) 50 MCG/ACT nasal spray Place 2 sprays into both nostrils daily. 16 g 6   furosemide  (LASIX ) 20 MG tablet Take 1 tablet by mouth once daily 90 tablet 0   Glucose Blood (BLOOD GLUCOSE TEST STRIPS) STRP May substitute to any manufacturer covered by patient's insurance. Patient to test glucose 2-3 times daily as needed 200 strip 11   Lancet Device MISC May substitute to any manufacturer covered by patient's insurance.  Patient to test glucose 2-3 times daily as needed 1 each 11   levocetirizine (XYZAL ) 5 MG tablet SMARTSIG:1 Tablet(s) By Mouth Every Evening     metoprolol  succinate (TOPROL -XL) 25 MG 24 hr tablet Take 0.5 tablets (12.5 mg total) by mouth daily. 15 tablet 3   montelukast  (SINGULAIR ) 10 MG tablet TAKE 1 TABLET BY MOUTH ONCE DAILY AT BEDTIME 30 tablet 5   pantoprazole  (PROTONIX ) 40 MG tablet Take 1 tablet (40 mg total) by mouth 2 (two) times daily before a meal. 180 tablet 1   polyethylene glycol (MIRALAX  / GLYCOLAX ) 17 g packet Take 17 g by mouth 2 (two) times daily. 14 each 0   potassium chloride  SA (KLOR-CON  M) 20 MEQ tablet Take 1 tablet (20 mEq total) by mouth daily. 90 tablet 3   rosuvastatin  (CRESTOR ) 40 MG tablet Take 1 tablet by mouth once daily 90 tablet 3   sodium chloride  (OCEAN) 0.65 % SOLN nasal spray Place 1 spray into both nostrils as needed for congestion. 480 mL 1   tiotropium (SPIRIVA  HANDIHALER) 18 MCG inhalation capsule Place 1 capsule (18 mcg total) into inhaler and inhale daily. 30 capsule 2   tirzepatide  (MOUNJARO ) 5 MG/0.5ML Pen Inject 5 mg into the skin once a week. 2 mL 0   tizanidine (ZANAFLEX) 2 MG capsule Take 1-2 capsules (2-4 mg total) by mouth 3 (three) times daily. 100 capsule 2   traMADol  (ULTRAM ) 50 MG tablet Take 1-2 tablets (50-100 mg total) by mouth every 8 (eight) hours as needed (chronic hip  pain). 120 tablet 0   No current facility-administered medications for this visit.    Allergies  Allergen Reactions   Gabapentin Other (See Comments)    Fluid retention    Clindamycin  Rash   Sulfa Antibiotics Nausea And Vomiting     REVIEW OF SYSTEMS:  [X]  denotes positive finding, [ ]  denotes negative finding Cardiac  Comments:  Chest pain or chest pressure:    Shortness of breath upon exertion:    Short of breath when lying flat:    Irregular heart rhythm:        Vascular    Pain in calf, thigh, or hip brought on by ambulation:    Pain in feet at night that wakes you up from your sleep:     Blood clot in your veins:    Leg swelling:         Pulmonary    Oxygen at home:    Productive cough:     Wheezing:         Neurologic    Sudden weakness in  arms or legs:     Sudden numbness in arms or legs:     Sudden onset of difficulty speaking or slurred speech:    Temporary loss of vision in one eye:     Problems with dizziness:         Gastrointestinal    Blood in stool:     Vomited blood:         Genitourinary    Burning when urinating:     Blood in urine:        Psychiatric    Major depression:         Hematologic    Bleeding problems:    Problems with blood clotting too easily:        Skin    Rashes or ulcers:        Constitutional    Fever or chills:      PHYSICAL EXAMINATION:  Vitals:   12/12/23 1539  BP: 106/75  Pulse: 77  Resp: (!) 22  Temp: 98.3 F (36.8 C)  TempSrc: Temporal  SpO2: 94%  Weight: 187 lb (84.8 kg)  Height: 5' 5 (1.651 m)    General:  WDWN in NAD; vital signs documented above Gait: Not observed HENT: WNL, normocephalic Pulmonary: normal non-labored breathing , without wheezing Cardiac: regular HR Abdomen: soft, NT, no masses Skin: without rashes Vascular Exam/Pulses:  Right Left  Radial 2+ (normal) 2+ (normal)  Ulnar    Femoral    Popliteal    DP 2+ (normal) 2+ (normal)  PT     Extremities: without ischemic  changes, without Gangrene , without cellulitis; without open wounds;  Musculoskeletal: no muscle wasting or atrophy  Neurologic: A&O X 3;  No focal weakness or paresthesias are detected Psychiatric:  The pt has Normal affect.   Non-Invasive Vascular Imaging:     IMPRESSION: 1. Severe thoracic and abdominal aortic atherosclerosis. Particularly, there is a large burden of irregular mural thrombus involving the descending thoracic Powell. Tubular ascending thoracic Powell measures up to 3.9 x 3.8 cm. Descending thoracic Powell measures up to 3.4 x 3.3 cm. 2. No evidence of dissection or other acute findings. 3. 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 4. 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 The vessel remains patent through its proximal order branches. 5. Coronary artery disease. 6. Emphysema.    ASSESSMENT/PLAN: ENYLA LISBON is a 78 y.o. female presenting with severe atherosclerotic disease within the thoracic and abdominal Powell.  There is irregular mural thrombus involving the descending thoracic Powell, with progression from over 2 years ago.  This was also appreciated 1 month ago on CT, and therefore is not acute.  I had a long conversation with Rock regarding her atherosclerotic disease.  We discussed that treating this disease would involve stent coverage, but in doing so, would have a very high risk of stroke, distal embolization.  The descending thoracic Powell remains small.  While she does have a small amount of stenosis within the superior mesenteric artery, she is asymptomatic.  Rock and I discussed that her current atherosclerotic disease is not 1 that I would treat as it would have a very high risk of embolic complication.  Donna and I discussed the importance of continued best medical therapy, aspirin, high intensity statin. She is  aware she does not need to vascular surgeons, and can continue follow-up with Dr. Teddie.  I  am happy to help should any questions or concerns arise.   Fonda FORBES Rim, MD [facility] [phone]

## 2023-12-13 ENCOUNTER — Telehealth: Payer: Self-pay

## 2023-12-13 NOTE — Patient Instructions (Signed)
 [name] - I am sorry I was unable to reach you today for our scheduled appointment. I work with Geofm, Glade PARAS, MD and am calling to support your healthcare needs. Please contact me at [phone] at your earliest convenience. I look forward to speaking with you soon.   Thank you,   Heddy Shutter, RN, MSN, BSN, CCM [place]  [facility], Population Health Case Manager Phone: [phone]

## 2023-12-16 ENCOUNTER — Ambulatory Visit: Admitting: Surgery

## 2023-12-20 ENCOUNTER — Emergency Department (HOSPITAL_COMMUNITY)

## 2023-12-20 ENCOUNTER — Encounter (HOSPITAL_COMMUNITY): Payer: Self-pay

## 2023-12-20 ENCOUNTER — Inpatient Hospital Stay (HOSPITAL_COMMUNITY)
Admission: EM | Admit: 2023-12-20 | Discharge: 2023-12-31 | DRG: 189 | Disposition: A | Discharge status: Skilled Nursing Facility | Attending: Student | Admitting: Student

## 2023-12-20 ENCOUNTER — Other Ambulatory Visit: Payer: Self-pay

## 2023-12-20 DIAGNOSIS — R419 Unspecified symptoms and signs involving cognitive functions and awareness: Secondary | ICD-10-CM | POA: Diagnosis not present

## 2023-12-20 DIAGNOSIS — I130 Hypertensive heart and chronic kidney disease with heart failure and stage 1 through stage 4 chronic kidney disease, or unspecified chronic kidney disease: Secondary | ICD-10-CM | POA: Diagnosis present

## 2023-12-20 DIAGNOSIS — Z823 Family history of stroke: Secondary | ICD-10-CM

## 2023-12-20 DIAGNOSIS — Z8419 Family history of other disorders of kidney and ureter: Secondary | ICD-10-CM

## 2023-12-20 DIAGNOSIS — N390 Urinary tract infection, site not specified: Secondary | ICD-10-CM | POA: Diagnosis present

## 2023-12-20 DIAGNOSIS — K219 Gastro-esophageal reflux disease without esophagitis: Secondary | ICD-10-CM | POA: Diagnosis present

## 2023-12-20 DIAGNOSIS — R609 Edema, unspecified: Secondary | ICD-10-CM | POA: Diagnosis not present

## 2023-12-20 DIAGNOSIS — I2510 Atherosclerotic heart disease of native coronary artery without angina pectoris: Secondary | ICD-10-CM | POA: Diagnosis present

## 2023-12-20 DIAGNOSIS — M25552 Pain in left hip: Secondary | ICD-10-CM | POA: Diagnosis present

## 2023-12-20 DIAGNOSIS — E876 Hypokalemia: Secondary | ICD-10-CM | POA: Diagnosis present

## 2023-12-20 DIAGNOSIS — Z7984 Long term (current) use of oral hypoglycemic drugs: Secondary | ICD-10-CM

## 2023-12-20 DIAGNOSIS — S72142D Displaced intertrochanteric fracture of left femur, subsequent encounter for closed fracture with routine healing: Secondary | ICD-10-CM | POA: Diagnosis not present

## 2023-12-20 DIAGNOSIS — Z6830 Body mass index (BMI) 30.0-30.9, adult: Secondary | ICD-10-CM

## 2023-12-20 DIAGNOSIS — Z7902 Long term (current) use of antithrombotics/antiplatelets: Secondary | ICD-10-CM

## 2023-12-20 DIAGNOSIS — Z8673 Personal history of transient ischemic attack (TIA), and cerebral infarction without residual deficits: Secondary | ICD-10-CM

## 2023-12-20 DIAGNOSIS — N179 Acute kidney failure, unspecified: Secondary | ICD-10-CM | POA: Diagnosis present

## 2023-12-20 DIAGNOSIS — E66811 Obesity, class 1: Secondary | ICD-10-CM | POA: Diagnosis present

## 2023-12-20 DIAGNOSIS — R296 Repeated falls: Secondary | ICD-10-CM | POA: Diagnosis present

## 2023-12-20 DIAGNOSIS — Z83438 Family history of other disorder of lipoprotein metabolism and other lipidemia: Secondary | ICD-10-CM

## 2023-12-20 DIAGNOSIS — Z833 Family history of diabetes mellitus: Secondary | ICD-10-CM

## 2023-12-20 DIAGNOSIS — E785 Hyperlipidemia, unspecified: Secondary | ICD-10-CM | POA: Diagnosis present

## 2023-12-20 DIAGNOSIS — F419 Anxiety disorder, unspecified: Secondary | ICD-10-CM | POA: Diagnosis present

## 2023-12-20 DIAGNOSIS — J9621 Acute and chronic respiratory failure with hypoxia: Secondary | ICD-10-CM | POA: Diagnosis present

## 2023-12-20 DIAGNOSIS — Z8261 Family history of arthritis: Secondary | ICD-10-CM

## 2023-12-20 DIAGNOSIS — R7989 Other specified abnormal findings of blood chemistry: Secondary | ICD-10-CM

## 2023-12-20 DIAGNOSIS — Z825 Family history of asthma and other chronic lower respiratory diseases: Secondary | ICD-10-CM

## 2023-12-20 DIAGNOSIS — N1832 Chronic kidney disease, stage 3b: Secondary | ICD-10-CM | POA: Diagnosis present

## 2023-12-20 DIAGNOSIS — J9811 Atelectasis: Secondary | ICD-10-CM | POA: Diagnosis present

## 2023-12-20 DIAGNOSIS — J9601 Acute respiratory failure with hypoxia: Secondary | ICD-10-CM | POA: Diagnosis present

## 2023-12-20 DIAGNOSIS — Z87891 Personal history of nicotine dependence: Secondary | ICD-10-CM

## 2023-12-20 DIAGNOSIS — Z7985 Long-term (current) use of injectable non-insulin antidiabetic drugs: Secondary | ICD-10-CM

## 2023-12-20 DIAGNOSIS — J441 Chronic obstructive pulmonary disease with (acute) exacerbation: Secondary | ICD-10-CM | POA: Diagnosis present

## 2023-12-20 DIAGNOSIS — Z7982 Long term (current) use of aspirin: Secondary | ICD-10-CM

## 2023-12-20 DIAGNOSIS — M109 Gout, unspecified: Secondary | ICD-10-CM | POA: Diagnosis present

## 2023-12-20 DIAGNOSIS — G2581 Restless legs syndrome: Secondary | ICD-10-CM | POA: Diagnosis present

## 2023-12-20 DIAGNOSIS — M85862 Other specified disorders of bone density and structure, left lower leg: Secondary | ICD-10-CM | POA: Diagnosis present

## 2023-12-20 DIAGNOSIS — Z85528 Personal history of other malignant neoplasm of kidney: Secondary | ICD-10-CM

## 2023-12-20 DIAGNOSIS — Z8249 Family history of ischemic heart disease and other diseases of the circulatory system: Secondary | ICD-10-CM | POA: Diagnosis not present

## 2023-12-20 DIAGNOSIS — I951 Orthostatic hypotension: Secondary | ICD-10-CM | POA: Diagnosis present

## 2023-12-20 DIAGNOSIS — Z79899 Other long term (current) drug therapy: Secondary | ICD-10-CM | POA: Diagnosis not present

## 2023-12-20 DIAGNOSIS — M79605 Pain in left leg: Secondary | ICD-10-CM | POA: Diagnosis not present

## 2023-12-20 DIAGNOSIS — Z8711 Personal history of peptic ulcer disease: Secondary | ICD-10-CM

## 2023-12-20 DIAGNOSIS — E1122 Type 2 diabetes mellitus with diabetic chronic kidney disease: Secondary | ICD-10-CM | POA: Diagnosis present

## 2023-12-20 DIAGNOSIS — J439 Emphysema, unspecified: Secondary | ICD-10-CM | POA: Diagnosis present

## 2023-12-20 DIAGNOSIS — Z66 Do not resuscitate: Secondary | ICD-10-CM | POA: Diagnosis present

## 2023-12-20 DIAGNOSIS — I5032 Chronic diastolic (congestive) heart failure: Secondary | ICD-10-CM | POA: Diagnosis present

## 2023-12-20 DIAGNOSIS — Z9181 History of falling: Secondary | ICD-10-CM | POA: Diagnosis not present

## 2023-12-20 DIAGNOSIS — R3129 Other microscopic hematuria: Secondary | ICD-10-CM | POA: Diagnosis present

## 2023-12-20 DIAGNOSIS — N189 Chronic kidney disease, unspecified: Secondary | ICD-10-CM | POA: Diagnosis not present

## 2023-12-20 LAB — HEPATIC FUNCTION PANEL
ALT: 11 U/L (ref 0–44)
AST: 22 U/L (ref 15–41)
Albumin: 3.4 g/dL — ABNORMAL LOW (ref 3.5–5.0)
Alkaline Phosphatase: 136 U/L — ABNORMAL HIGH (ref 38–126)
Bilirubin, Direct: 0.2 mg/dL (ref 0.0–0.2)
Indirect Bilirubin: 0.7 mg/dL (ref 0.3–0.9)
Total Bilirubin: 0.9 mg/dL (ref 0.0–1.2)
Total Protein: 7.2 g/dL (ref 6.5–8.1)

## 2023-12-20 LAB — BASIC METABOLIC PANEL WITH GFR
Anion gap: 14 (ref 5–15)
BUN: 16 mg/dL (ref 8–23)
CO2: 22 mmol/L (ref 22–32)
Calcium: 9.8 mg/dL (ref 8.9–10.3)
Chloride: 99 mmol/L (ref 98–111)
Creatinine, Ser: 2.18 mg/dL — ABNORMAL HIGH (ref 0.44–1.00)
GFR, Estimated: 23 mL/min — ABNORMAL LOW (ref >60)
Glucose, Bld: 77 mg/dL (ref 70–99)
Potassium: 4 mmol/L (ref 3.5–5.1)
Sodium: 135 mmol/L (ref 135–145)

## 2023-12-20 LAB — PROTIME-INR
INR: 1 (ref 0.8–1.2)
Prothrombin Time: 13.5 s (ref 11.4–15.2)

## 2023-12-20 LAB — CBC
HCT: 46.1 % — ABNORMAL HIGH (ref 36.0–46.0)
Hemoglobin: 14.2 g/dL (ref 12.0–15.0)
MCH: 29.4 pg (ref 26.0–34.0)
MCHC: 30.8 g/dL (ref 30.0–36.0)
MCV: 95.4 fL (ref 80.0–100.0)
Platelets: 279 K/uL (ref 150–400)
RBC: 4.83 MIL/uL (ref 3.87–5.11)
RDW: 14.9 % (ref 11.5–15.5)
WBC: 8.4 K/uL (ref 4.0–10.5)
nRBC: 0 % (ref 0.0–0.2)

## 2023-12-20 LAB — RESP PANEL BY RT-PCR (RSV, FLU A&B, COVID)  RVPGX2
Influenza A by PCR: NEGATIVE
Influenza B by PCR: NEGATIVE
Resp Syncytial Virus by PCR: NEGATIVE
SARS Coronavirus 2 by RT PCR: NEGATIVE

## 2023-12-20 LAB — BRAIN NATRIURETIC PEPTIDE: B Natriuretic Peptide: 29.5 pg/mL (ref 0.0–100.0)

## 2023-12-20 LAB — TROPONIN I (HIGH SENSITIVITY): Troponin I (High Sensitivity): 12 ng/L (ref <18)

## 2023-12-20 LAB — D-DIMER, QUANTITATIVE: D-Dimer, Quant: 1.83 ug{FEU}/mL — ABNORMAL HIGH (ref 0.00–0.50)

## 2023-12-20 MED ORDER — FENTANYL CITRATE (PF) 50 MCG/ML IJ SOSY
50.0000 ug | PREFILLED_SYRINGE | Freq: Once | INTRAMUSCULAR | Status: AC
  Administered 2023-12-20: 50 ug via INTRAVENOUS
  Filled 2023-12-20: qty 1

## 2023-12-20 MED ORDER — SODIUM CHLORIDE 0.9 % IV BOLUS
500.0000 mL | Freq: Once | INTRAVENOUS | Status: AC
Start: 2023-12-20 — End: 2023-12-21
  Administered 2023-12-20: 500 mL via INTRAVENOUS

## 2023-12-20 NOTE — ED Notes (Signed)
 Unable to get blood work called mini lab, report given to Public relations account executive

## 2023-12-20 NOTE — ED Provider Notes (Signed)
 [place] EMERGENCY DEPARTMENT AT [facility] Provider Note   CSN: [id number] Arrival date & time: 12/20/23  1418     Patient presents with: Shortness of Breath   Kerry Powell is a 78 y.o. 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  Patient reports she has been having increased shortness of breath with exertion and increasing oxygen requirement.  She typically only wears 2 L oxygen at home and is now requiring 4 L.  She also had a fall while trying to get into bed today, her son caught her but she did fall onto her knees.  Patient has significant pain in her left lower leg and hip in the setting of recent surgeries and her pain has yet to improve.  Son also reports that past week or so she has been seen people who are not there many times and has had multiple recent falls.  He reports he is acting similarly when she had cellulitis previously.  She denies nausea, vomiting, diarrhea, fever, chills, or other sick symptoms.    Shortness of Breath      Prior to Admission medications   Medication Sig Start Date End Date Taking? Authorizing Provider  albuterol  (PROVENTIL ) (2.5 MG/3ML) 0.083% nebulizer solution USE 1 VIAL IN NEBULIZER EVERY 6 HOURS AND AS NEEDED FOR SHORT BREATH/WHEEZE.  Generic:  Ventolin  09/30/23   Meade Verdon RAMAN, MD  allopurinol  (ZYLOPRIM ) 300 MG tablet Take 1 tablet by mouth once daily 09/02/23   Geofm Glade PARAS, MD  bisacodyl  (DULCOLAX) 10 MG suppository Place 1 suppository (10 mg total) rectally daily as needed for moderate constipation. 10/31/23   Christobal Guadalajara, MD   Blood Glucose Monitoring Suppl DEVI May substitute to any manufacturer covered by patient's insurance. Patient request Accu-check meter and testing suppliles 12/06/23   Burns, Glade PARAS, MD  budesonide -formoterol  (SYMBICORT ) 160-4.5 MCG/ACT inhaler Inhale 2 puffs into the lungs 2 (two) times daily. 10/13/23   Ghimire, Donalda HERO, MD  cetirizine  (ZYRTEC  ALLERGY) 10 MG tablet Take 10 mg by mouth daily as needed for allergies or rhinitis. 05/06/19   [provider]  cholecalciferol (VITAMIN D3) 25 MCG (1000 UNIT) tablet Take 2,000 Units by mouth daily.    [provider]  clopidogrel  (PLAVIX ) 75 MG tablet Take 1 tablet by mouth once daily 09/02/23   Geofm Glade PARAS, MD  DULoxetine  (CYMBALTA ) 30 MG capsule Take 2 capsules (60 mg total) by mouth daily. 12/05/23   Geofm Glade PARAS, MD  FARXIGA  10 MG TABS tablet Take 1 tablet (10 mg total) by mouth daily. Take 10 mg by mouth daily. 03/21/23   Geofm Glade PARAS, MD  ferrous sulfate  325 (65 FE) MG tablet Take 325 mg by mouth every other day.    [provider]  fluticasone  (FLONASE ) 50 MCG/ACT nasal spray Place 2 sprays into both nostrils daily. 10/03/23   Soldatova, Liuba, MD  furosemide  (LASIX ) 20 MG tablet Take 1 tablet by mouth once daily 11/04/23   Geofm Glade PARAS, MD  Glucose Blood (BLOOD GLUCOSE TEST STRIPS) STRP May substitute to any manufacturer covered by patient's insurance. Patient to test glucose 2-3 times daily as needed  12/06/23   Geofm Glade PARAS, MD  Lancet Device MISC May substitute to any manufacturer covered by patient's insurance.  Patient to test glucose 2-3 times daily as needed 12/06/23   Geofm Glade PARAS, MD  levocetirizine (XYZAL ) 5 MG tablet SMARTSIG:1 Tablet(s) By Mouth Every Evening 12/04/23   [provider]  metoprolol  succinate (TOPROL -XL) 25 MG 24 hr tablet Take 0.5 tablets (12.5 mg total) by mouth daily. 12/03/23   Geofm Glade PARAS, MD  montelukast  (SINGULAIR ) 10 MG tablet TAKE 1 TABLET BY MOUTH ONCE DAILY AT  BEDTIME 07/25/23   Geofm Glade PARAS, MD  pantoprazole  (PROTONIX ) 40 MG tablet Take 1 tablet (40 mg total) by mouth 2 (two) times daily before a meal. 02/01/23   Burns, Glade PARAS, MD  polyethylene glycol (MIRALAX  / GLYCOLAX ) 17 g packet Take 17 g by mouth 2 (two) times daily. 10/31/23   Christobal Guadalajara, MD  potassium chloride  SA (KLOR-CON  M) 20 MEQ tablet Take 1 tablet (20 mEq total) by mouth daily. 11/23/23   Geofm Glade PARAS, MD  rosuvastatin  (CRESTOR ) 40 MG tablet Take 1 tablet by mouth once daily 09/02/23   Shlomo Wilbert SAUNDERS, MD  sodium chloride  (OCEAN) 0.65 % SOLN nasal spray Place 1 spray into both nostrils as needed for congestion. 07/29/23   Desai, Nikita S, MD  tiotropium (SPIRIVA  HANDIHALER) 18 MCG inhalation capsule Place 1 capsule (18 mcg total) into inhaler and inhale daily. 10/13/23 10/12/24  Ghimire, Donalda HERO, MD  tirzepatide  (MOUNJARO ) 5 MG/0.5ML Pen Inject 5 mg into the skin once a week. 12/05/23   Geofm Glade PARAS, MD  tizanidine (ZANAFLEX) 2 MG capsule Take 1-2 capsules (2-4 mg total) by mouth 3 (three) times daily. 12/05/23   Geofm Glade PARAS, MD  traMADol  (ULTRAM ) 50 MG tablet Take 1-2 tablets (50-100 mg total) by mouth every 8 (eight) hours as needed (chronic hip pain). 12/09/23   Geofm Glade PARAS, MD    Allergies: Gabapentin, Clindamycin , and Sulfa antibiotics    Review of Systems  Respiratory:  Positive for shortness of breath.   Musculoskeletal:        Left lower extremity pain   Psychiatric/Behavioral:  Positive for hallucinations.    Updated Vital Signs BP (!) 144/73   Pulse 95   Temp (!) 97.5 F (36.4 C) (Oral)   Resp 19   Ht 5' 5 (1.651 m)   Wt 84.8 kg   SpO2 94%   BMI 31.11 kg/m   Physical Exam Vitals and nursing note reviewed.  Constitutional:      General: She is not in acute distress.    Appearance: She is well-developed.  HENT:     Head: Normocephalic and atraumatic.  Eyes:     Conjunctiva/sclera: Conjunctivae normal.  Cardiovascular:     Rate and Rhythm: Normal rate and  regular rhythm.     Heart sounds: No murmur heard. Pulmonary:     Effort: Pulmonary effort is normal. No respiratory distress.     Breath sounds: Normal breath sounds.     Comments: On 4 L O2 Abdominal:     Palpations: Abdomen is soft.     Tenderness: There is no abdominal tenderness.  Musculoskeletal:        General: No swelling.     Cervical back: Neck supple.     Right lower leg: Edema present.     Left lower leg: Tenderness (Left lower leg and hip) present. Edema present.  Skin:    General: Skin is warm and dry.  Capillary Refill: Capillary refill takes less than 2 seconds.     Comments: Healing surgical incision over left lower leg, no significant surrounding erythema, edema, or warmth  Neurological:     General: No focal deficit present.     Mental Status: She is alert and oriented to person, place, and time.     (all labs ordered are listed, but only abnormal results are displayed) Labs Reviewed  BASIC METABOLIC PANEL WITH GFR - Abnormal; Notable for the following components:      Result Value   Creatinine, Ser 2.18 (*)    GFR, Estimated 23 (*)    All other components within normal limits  CBC - Abnormal; Notable for the following components:   HCT 46.1 (*)    All other components within normal limits  D-DIMER, QUANTITATIVE - Abnormal; Notable for the following components:   D-Dimer, Quant 1.83 (*)    All other components within normal limits  RESP PANEL BY RT-PCR (RSV, FLU A&B, COVID)  [id number]  CULTURE, BLOOD (ROUTINE X 2)  CULTURE, BLOOD (ROUTINE X 2)  BRAIN NATRIURETIC PEPTIDE  PROTIME-INR  TROPONIN I (HIGH SENSITIVITY)  TROPONIN I (HIGH SENSITIVITY)    EKG: EKG Interpretation Date/Time:  Friday December 20 2023 14:53:27 EDT Ventricular Rate:  98 PR Interval:  208 QRS Duration:  80 QT Interval:  348 QTC Calculation: 444 R Axis:   48  Text Interpretation: Normal sinus rhythm Normal ECG When compared with ECG of 21-Oct-2023 16:43, PREVIOUS ECG IS  PRESENT No significant change since last tracing Confirmed by Dean Clarity [phone]) on 12/20/2023 3:12:55 PM  Radiology: CT Head Wo Contrast Result Date: 12/20/2023 EXAM: CT HEAD WITHOUT CONTRAST 12/20/2023 09:52:37 PM TECHNIQUE: CT of the head was performed without the administration of intravenous contrast. Automated exposure control, iterative reconstruction, and/or weight based adjustment of the mA/kV was utilized to reduce the radiation dose to as low as reasonably achievable. COMPARISON: 10/21/2023 CLINICAL HISTORY: Memory loss. Pt states multiple falls and hallucinations. FINDINGS: BRAIN AND VENTRICLES: No acute hemorrhage. No evidence of acute infarct. 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 Stable remote lacunar infarct in left lentiform nucleus. Stable white matter changes compared to prior study. No hydrocephalus. No extra-axial collection. No mass effect or midline shift. Atherosclerotic calcifications in bilateral cavernous internal carotid arteries. ORBITS: Bilateral lens replacements noted. SINUSES: Fluid in posterior left ethmoid air cells. SOFT TISSUES AND SKULL: No acute soft tissue abnormality. No skull fracture. IMPRESSION: 1. No acute intracranial abnormality. Electronically signed by: Pinkie Pebbles MD 12/20/2023 09:56 PM EDT RP Workstation: [id number]   DG Chest Portable 1 View Result Date: 12/20/2023 CLINICAL DATA:  Shortness of breath.  Status post fall, pain. EXAM: PORTABLE CHEST 1 VIEW COMPARISON:  Radiograph 10/21/2023, CT 12/04/2023 FINDINGS: Hyperinflation and emphysema. Normal heart size with stable mediastinal contours. Aortic atherosclerosis. No confluent opacity, large pleural effusion or pneumothorax. No pulmonary edema. On limited assessment, no acute osseous findings. IMPRESSION: Hyperinflation and emphysema. No acute findings. Electronically Signed   By: Andrea Gasman M.D.   On: 12/20/2023 17:19   DG Pelvis 1-2 Views Result Date:  12/20/2023 CLINICAL DATA:  Pain after fall. EXAM: DG PELVIS 1-2V COMPARISON:  Radiograph 10/21/2023 FINDINGS: Femoral intramedullary nail and trans trochanteric screw fixation of previous intertrochanteric femur fracture. Interval fracture healing from initial injury radiograph. The hardware is intact were visualized. No evidence of acute pelvic fracture. Pubic rami are intact. No pubic symphyseal or sacroiliac diastasis. IMPRESSION: 1. No acute pelvic fracture. 2. ORIF  of previous intertrochanteric femur fracture. Electronically Signed   By: Andrea Gasman M.D.   On: 12/20/2023 17:16   DG Tibia/Fibula Left Result Date: 12/20/2023 EXAM: 2 VIEW(S) XRAY OF THE LEFT TIBIA AND FIBULA 12/20/2023 04:46:00 PM COMPARISON: None available. CLINICAL HISTORY: Pain after fall. Fall w/ pain. FINDINGS: BONES AND JOINTS: Degenerative changes of the knee and ankle. On the lateral view only, there is a subtle cortical defect along the anterior aspect of the lateral malleolus. This is not confirmed on any other view. The bones are osteopenic. No joint dislocation. SOFT TISSUES: The soft tissues are unremarkable. IMPRESSION: 1. Subtle cortical defect along the anterior aspect of the lateral malleolus on the lateral view only, not confirmed on other views. A subtle nondisplaced fracture cannot be excluded. If there is high clinical concern for an acute fracture at this level, recommend recommend dedicated images of the left ankle. 2. Osteopenia. Electronically signed by: Greig Pique MD 12/20/2023 05:16 PM EDT RP Workstation: [id number]     Procedures   Medications Ordered in the ED  sodium chloride  0.9 % bolus 500 mL (has no administration in time range)  fentaNYL  (SUBLIMAZE ) injection 50 mcg (50 mcg Intravenous Given 12/20/23 1621)                                    Medical Decision Making Patient presents emergency department with worsening shortness of breath, recent fall, and left leg pain.  Patient also with  new visual hallucinations of seeing  people who are not physically there.  Patient with a recent complex medical history including aortic mural thrombus, left lower extremity cellulitis with underlying abscess and recent hip fracture.  On exam, patient with tenderness over left lower leg with a healing surgical wound.  Nonfocal neuroexam.  Lungs clear to auscultation bilaterally and otherwise hemodynamically stable.  Differential diagnosis considered but not limited to COPD exacerbation, pneumonia, pulmonary embolism, soft tissue infection, fracture  Given patient's history of known aortic thrombus not on anticoagulation and new onset shortness of breath, patient is at high risk for PE and CTA chest was ordered.  Patient's left lower extremity is without significant erythema, warmth, or edema with a well-healing surgical scar however she does have recent history of cellulitis with significant tenderness on exam, blood cultures were also ordered.  Patient without any overt infectious signs or symptoms and afebrile, antibiotics were deferred at this time pending further evaluation.  CT head was also ordered in the setting of new visual hallucinations and recent fall.  EKG revealed normal sinus rhythm with prolonged PR interval, no significant changes from patient's previous EKG.  Labs without significant leukocytosis, anemia, or electrolyte abnormality.  Patient's creatinine is 2.13 up from 1.86 4 weeks ago concerning for AKI, because of this, CTA chest will be deferred and D-dimer was ordered.  Patient was also given IV fluid bolus for treatment of AKI.  X-rays of the left pelvis and tib-fib without evidence of osteomyelitis or acute fracture.  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  Patient D-dimer was elevated at 1.83.  Patient requires admission for continued IV hydration send of AKI, discussed with hospitalist regarding initiation of heparin  in setting of elevated  D-dimer.  Given that patient is hemodynamically stable, heparinization was held at this time pending further evaluation and workup. Patient was admitted to the hospitalist team in stable condition.  Amount and/or  Complexity of Data Reviewed External Data Reviewed: labs, radiology and notes. Labs: ordered. Radiology: ordered.  Risk Prescription drug management.        Final diagnoses:  AKI (acute kidney injury)  D-dimer, elevated    ED Discharge Orders     None          Sharlet Dowdy, MD 12/20/23 2234    Dean Clarity, MD 12/20/23 2309

## 2023-12-20 NOTE — ED Triage Notes (Signed)
 Pt arrived via EMS CC of SOB and increased oxygen requirement. Pt also report multiple falls and visual hallucinations. Possible cellulitis to left leg. Hx COPD 2L. CHF

## 2023-12-21 ENCOUNTER — Inpatient Hospital Stay (HOSPITAL_COMMUNITY)

## 2023-12-21 DIAGNOSIS — Z9181 History of falling: Secondary | ICD-10-CM

## 2023-12-21 DIAGNOSIS — N179 Acute kidney failure, unspecified: Secondary | ICD-10-CM

## 2023-12-21 DIAGNOSIS — J9621 Acute and chronic respiratory failure with hypoxia: Secondary | ICD-10-CM

## 2023-12-21 DIAGNOSIS — M79605 Pain in left leg: Secondary | ICD-10-CM | POA: Diagnosis not present

## 2023-12-21 DIAGNOSIS — J9601 Acute respiratory failure with hypoxia: Secondary | ICD-10-CM | POA: Diagnosis not present

## 2023-12-21 LAB — MAGNESIUM: Magnesium: 1.7 mg/dL (ref 1.7–2.4)

## 2023-12-21 LAB — BASIC METABOLIC PANEL WITH GFR
Anion gap: 13 (ref 5–15)
BUN: 13 mg/dL (ref 8–23)
CO2: 21 mmol/L — ABNORMAL LOW (ref 22–32)
Calcium: 9.3 mg/dL (ref 8.9–10.3)
Chloride: 101 mmol/L (ref 98–111)
Creatinine, Ser: 1.94 mg/dL — ABNORMAL HIGH (ref 0.44–1.00)
GFR, Estimated: 26 mL/min — ABNORMAL LOW (ref >60)
Glucose, Bld: 92 mg/dL (ref 70–99)
Potassium: 3.7 mmol/L (ref 3.5–5.1)
Sodium: 135 mmol/L (ref 135–145)

## 2023-12-21 LAB — CBC
HCT: 41.4 % (ref 36.0–46.0)
Hemoglobin: 13 g/dL (ref 12.0–15.0)
MCH: 30 pg (ref 26.0–34.0)
MCHC: 31.4 g/dL (ref 30.0–36.0)
MCV: 95.4 fL (ref 80.0–100.0)
Platelets: 233 K/uL (ref 150–400)
RBC: 4.34 MIL/uL (ref 3.87–5.11)
RDW: 14.6 % (ref 11.5–15.5)
WBC: 9.3 K/uL (ref 4.0–10.5)
nRBC: 0 % (ref 0.0–0.2)

## 2023-12-21 LAB — GLUCOSE, CAPILLARY
Glucose-Capillary: 97 mg/dL (ref 70–99)
Glucose-Capillary: 123 mg/dL — ABNORMAL HIGH (ref 70–99)
Glucose-Capillary: 144 mg/dL — ABNORMAL HIGH (ref 70–99)
Glucose-Capillary: 118 mg/dL — ABNORMAL HIGH (ref 70–99)

## 2023-12-21 LAB — PHOSPHORUS: Phosphorus: 3.9 mg/dL (ref 2.5–4.6)

## 2023-12-21 LAB — LACTIC ACID, PLASMA: Lactic Acid, Venous: 1 mmol/L (ref 0.5–1.9)

## 2023-12-21 LAB — MRSA NEXT GEN BY PCR, NASAL: MRSA by PCR Next Gen: NOT DETECTED

## 2023-12-21 LAB — TSH: TSH: 7.062 u[IU]/mL — ABNORMAL HIGH (ref 0.350–4.500)

## 2023-12-21 MED ORDER — ASPIRIN 81 MG PO TBEC
81.0000 mg | DELAYED_RELEASE_TABLET | Freq: Every day | ORAL | Status: DC
  Administered 2023-12-21 – 2023-12-31 (×11): 81 mg via ORAL
  Filled 2023-12-21 (×12): qty 1

## 2023-12-21 MED ORDER — ROSUVASTATIN CALCIUM 5 MG PO TABS
10.0000 mg | ORAL_TABLET | Freq: Every day | ORAL | Status: DC
  Administered 2023-12-21 – 2023-12-31 (×11): 10 mg via ORAL
  Filled 2023-12-21 (×12): qty 2

## 2023-12-21 MED ORDER — INSULIN ASPART 100 UNIT/ML IJ SOLN: 0.0000 [IU] | Freq: Three times a day (TID) | INTRAMUSCULAR | Status: DC

## 2023-12-21 MED ORDER — DULOXETINE HCL 60 MG PO CPEP
60.0000 mg | ORAL_CAPSULE | Freq: Every day | ORAL | Status: DC
  Administered 2023-12-21 – 2023-12-31 (×11): 60 mg via ORAL
  Filled 2023-12-21 (×13): qty 1

## 2023-12-21 MED ORDER — CLOPIDOGREL BISULFATE 75 MG PO TABS
75.0000 mg | ORAL_TABLET | Freq: Every day | ORAL | Status: DC
  Administered 2023-12-21 – 2023-12-31 (×11): 75 mg via ORAL
  Filled 2023-12-21 (×12): qty 1

## 2023-12-21 MED ORDER — METHOCARBAMOL 500 MG PO TABS
500.0000 mg | ORAL_TABLET | Freq: Three times a day (TID) | ORAL | Status: DC | PRN
  Administered 2023-12-22 – 2023-12-27 (×8): 500 mg via ORAL
  Filled 2023-12-21 (×8): qty 1

## 2023-12-21 MED ORDER — IPRATROPIUM-ALBUTEROL 0.5-2.5 (3) MG/3ML IN SOLN
3.0000 mL | Freq: Three times a day (TID) | RESPIRATORY_TRACT | Status: DC
  Administered 2023-12-21 – 2023-12-23 (×5): 3 mL via RESPIRATORY_TRACT
  Filled 2023-12-21 (×6): qty 3

## 2023-12-21 MED ORDER — UMECLIDINIUM BROMIDE 62.5 MCG/ACT IN AEPB
1.0000 | INHALATION_SPRAY | Freq: Every day | RESPIRATORY_TRACT | Status: DC
  Administered 2023-12-21 – 2023-12-31 (×9): 1 via RESPIRATORY_TRACT
  Filled 2023-12-21 (×3): qty 7

## 2023-12-21 MED ORDER — IPRATROPIUM-ALBUTEROL 0.5-2.5 (3) MG/3ML IN SOLN
3.0000 mL | Freq: Four times a day (QID) | RESPIRATORY_TRACT | Status: DC
  Administered 2023-12-21 (×3): 3 mL via RESPIRATORY_TRACT
  Filled 2023-12-21 (×3): qty 3

## 2023-12-21 MED ORDER — POLYETHYLENE GLYCOL 3350 17 G PO PACK
17.0000 g | PACK | Freq: Every day | ORAL | Status: DC | PRN
  Administered 2023-12-25: 17 g via ORAL
  Filled 2023-12-21: qty 1

## 2023-12-21 MED ORDER — ALBUTEROL SULFATE (2.5 MG/3ML) 0.083% IN NEBU
2.5000 mg | INHALATION_SOLUTION | RESPIRATORY_TRACT | Status: DC | PRN
  Administered 2023-12-21 – 2023-12-24 (×3): 2.5 mg via RESPIRATORY_TRACT
  Filled 2023-12-21 (×3): qty 3

## 2023-12-21 MED ORDER — ALLOPURINOL 300 MG PO TABS
300.0000 mg | ORAL_TABLET | Freq: Every day | ORAL | Status: DC
  Administered 2023-12-21 – 2023-12-31 (×11): 300 mg via ORAL
  Filled 2023-12-21 (×11): qty 1

## 2023-12-21 MED ORDER — ONDANSETRON HCL 4 MG/2ML IJ SOLN
4.0000 mg | Freq: Four times a day (QID) | INTRAMUSCULAR | Status: DC | PRN
  Administered 2023-12-22: 4 mg via INTRAVENOUS
  Filled 2023-12-21: qty 2

## 2023-12-21 MED ORDER — TRAMADOL HCL 50 MG PO TABS
50.0000 mg | ORAL_TABLET | Freq: Two times a day (BID) | ORAL | Status: DC | PRN
  Administered 2023-12-22 – 2023-12-31 (×11): 50 mg via ORAL
  Filled 2023-12-21 (×15): qty 1

## 2023-12-21 MED ORDER — METOPROLOL SUCCINATE ER 25 MG PO TB24
12.5000 mg | ORAL_TABLET | Freq: Every day | ORAL | Status: DC
  Administered 2023-12-21 – 2023-12-24 (×4): 12.5 mg via ORAL
  Filled 2023-12-21 (×5): qty 1

## 2023-12-21 MED ORDER — ACETAMINOPHEN 500 MG PO TABS
1000.0000 mg | ORAL_TABLET | Freq: Four times a day (QID) | ORAL | Status: DC | PRN
  Administered 2023-12-21 – 2023-12-25 (×8): 1000 mg via ORAL
  Filled 2023-12-21 (×8): qty 2

## 2023-12-21 MED ORDER — MELATONIN 3 MG PO TABS
6.0000 mg | ORAL_TABLET | Freq: Every evening | ORAL | Status: DC | PRN
  Administered 2023-12-22: 6 mg via ORAL
  Filled 2023-12-21 (×2): qty 2

## 2023-12-21 MED ORDER — MELATONIN 3 MG PO TABS
6.0000 mg | ORAL_TABLET | Freq: Every day | ORAL | Status: DC
  Administered 2023-12-21 (×2): 6 mg via ORAL
  Filled 2023-12-21 (×2): qty 2

## 2023-12-21 MED ORDER — FLUTICASONE FUROATE-VILANTEROL 100-25 MCG/ACT IN AEPB
1.0000 | INHALATION_SPRAY | Freq: Every day | RESPIRATORY_TRACT | Status: DC
  Administered 2023-12-21 – 2023-12-31 (×11): 1 via RESPIRATORY_TRACT
  Filled 2023-12-21: qty 28

## 2023-12-21 MED ORDER — SODIUM CHLORIDE 0.9% FLUSH
3.0000 mL | Freq: Two times a day (BID) | INTRAVENOUS | Status: DC
  Administered 2023-12-21 – 2023-12-31 (×19): 3 mL via INTRAVENOUS

## 2023-12-21 MED ORDER — OXYCODONE HCL 5 MG PO TABS
2.5000 mg | ORAL_TABLET | ORAL | Status: DC | PRN
  Administered 2023-12-21: 2.5 mg via ORAL
  Filled 2023-12-21: qty 1

## 2023-12-21 MED ORDER — HEPARIN SODIUM (PORCINE) 5000 UNIT/ML IJ SOLN
5000.0000 [IU] | Freq: Three times a day (TID) | INTRAMUSCULAR | Status: DC
  Administered 2023-12-21 – 2023-12-27 (×19): 5000 [IU] via SUBCUTANEOUS
  Filled 2023-12-21 (×19): qty 1

## 2023-12-21 MED ORDER — LACTATED RINGERS IV BOLUS
1000.0000 mL | Freq: Once | INTRAVENOUS | Status: AC
  Administered 2023-12-21: 1000 mL via INTRAVENOUS

## 2023-12-21 NOTE — Plan of Care (Signed)

## 2023-12-21 NOTE — H&P (Signed)
 History and Physical    Kerry Powell FMW:[id number] DOB: [birth year] DOA: 12/20/2023  PCP: Kerry Glade PARAS, MD   Patient coming from: Home   Chief Complaint:  Chief Complaint  Patient presents with   Shortness of Breath    HPI:  Kerry Powell is a 78 y.o. 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   Reports that since her hip surgery having persistent pain in the left hip, starts along the posterior aspect and wraps around her groin anteriorly. She occasionally has pain that radiates down front of her leg to her knee. And she is bothered by frequent muscle spasms in her leg. Difficulty with ambulation. Bothered by leg length discrepancy. Less so having pain distally in knee / ankle. Today was unable to stand, says that she didn't really fall. PT/OT who was at the house helped her.   Otherwise reports few weeks of increased SOB from her baseline. She has increased her home O2 from 2-> 4L. She denies any associated chest pain. There is no lower extremity swelling. Had a cough with brown sputum. Is using home inhalers. Nonsmoker.   Review of Systems:  ROS complete and negative except as marked above  + formed visual hallucinations and some memory issues   Allergies  Allergen Reactions   Gabapentin Other (See Comments)    Fluid retention    Clindamycin  Rash   Sulfa Antibiotics Nausea And Vomiting    Prior to Admission medications   Medication Sig Start Date End Date Taking? Authorizing Provider  albuterol  (PROVENTIL ) (2.5 MG/3ML) 0.083% nebulizer solution USE 1 VIAL IN NEBULIZER EVERY 6 HOURS AND AS NEEDED FOR SHORT BREATH/WHEEZE.  Generic:  Ventolin  09/30/23   Meade Verdon RAMAN, MD  allopurinol  (ZYLOPRIM ) 300 MG tablet Take 1 tablet by mouth once  daily 09/02/23   Kerry Glade PARAS, MD  bisacodyl  (DULCOLAX) 10 MG suppository Place 1 suppository (10 mg total) rectally daily as needed for moderate constipation. 10/31/23   Christobal Guadalajara, MD  Blood Glucose Monitoring Suppl DEVI May substitute to any manufacturer covered by patient's insurance. Patient request Accu-check meter and testing suppliles 12/06/23   Burns, Glade PARAS, MD  budesonide -formoterol  (SYMBICORT ) 160-4.5 MCG/ACT inhaler Inhale 2 puffs into the lungs 2 (two) times daily. 10/13/23   Ghimire, Donalda HERO, MD  cetirizine  (ZYRTEC  ALLERGY) 10 MG tablet Take 10 mg by mouth daily as needed for allergies or rhinitis. 05/06/19   [provider]  cholecalciferol (VITAMIN D3) 25 MCG (1000 UNIT) tablet Take 2,000 Units by mouth daily.    [provider]  clopidogrel  (PLAVIX ) 75 MG tablet Take 1 tablet by mouth once daily 09/02/23   Kerry Glade PARAS, MD  DULoxetine  (CYMBALTA ) 30 MG capsule Take 2 capsules (60 mg total) by mouth daily. 12/05/23   Kerry Glade PARAS, MD  FARXIGA  10 MG TABS tablet Take 1 tablet (10 mg total) by mouth daily. Take 10 mg by mouth daily. 03/21/23   Kerry Glade PARAS, MD  ferrous sulfate  325 (65 FE) MG tablet Take 325 mg by mouth every other day.    [provider]  fluticasone  (FLONASE ) 50 MCG/ACT nasal spray Place 2 sprays into both nostrils daily. 10/03/23   Soldatova, Liuba, MD  furosemide  (LASIX ) 20  MG tablet Take 1 tablet by mouth once daily 11/04/23   Kerry Glade PARAS, MD  Glucose Blood (BLOOD GLUCOSE TEST STRIPS) STRP May substitute to any manufacturer covered by patient's insurance. Patient to test glucose 2-3 times daily as needed 12/06/23   Kerry Glade PARAS, MD  Lancet Device MISC May substitute to any manufacturer covered by patient's insurance.  Patient to test glucose 2-3 times daily as needed 12/06/23   Kerry Glade PARAS, MD  levocetirizine (XYZAL ) 5 MG tablet SMARTSIG:1 Tablet(s) By Mouth Every Evening 12/04/23   [provider]  metoprolol  succinate  (TOPROL -XL) 25 MG 24 hr tablet Take 0.5 tablets (12.5 mg total) by mouth daily. 12/03/23   Kerry Glade PARAS, MD  montelukast  (SINGULAIR ) 10 MG tablet TAKE 1 TABLET BY MOUTH ONCE DAILY AT BEDTIME 07/25/23   Kerry Glade PARAS, MD  pantoprazole  (PROTONIX ) 40 MG tablet Take 1 tablet (40 mg total) by mouth 2 (two) times daily before a meal. 02/01/23   Burns, Glade PARAS, MD  polyethylene glycol (MIRALAX  / GLYCOLAX ) 17 g packet Take 17 g by mouth 2 (two) times daily. 10/31/23   Christobal Guadalajara, MD  potassium chloride  SA (KLOR-CON  M) 20 MEQ tablet Take 1 tablet (20 mEq total) by mouth daily. 11/23/23   Kerry Glade PARAS, MD  rosuvastatin  (CRESTOR ) 40 MG tablet Take 1 tablet by mouth once daily 09/02/23   Turner, Wilbert SAUNDERS, MD  sodium chloride  (OCEAN) 0.65 % SOLN nasal spray Place 1 spray into both nostrils as needed for congestion. 07/29/23   Desai, Nikita S, MD  tiotropium (SPIRIVA  HANDIHALER) 18 MCG inhalation capsule Place 1 capsule (18 mcg total) into inhaler and inhale daily. 10/13/23 10/12/24  Ghimire, Donalda HERO, MD  tirzepatide  (MOUNJARO ) 5 MG/0.5ML Pen Inject 5 mg into the skin once a week. 12/05/23   Kerry Glade PARAS, MD  tizanidine (ZANAFLEX) 2 MG capsule Take 1-2 capsules (2-4 mg total) by mouth 3 (three) times daily. 12/05/23   Kerry Glade PARAS, MD  traMADol  (ULTRAM ) 50 MG tablet Take 1-2 tablets (50-100 mg total) by mouth every 8 (eight) hours as needed (chronic hip pain). 12/09/23   Kerry Glade PARAS, MD    Past Medical History:  Diagnosis Date   Allergy    Anemia    Anxiety    Arthritis    BACK PAIN, CHRONIC 04/04/2007   Qualifier: Diagnosis of  By: [name], [name]    BIPOLAR AFFECTIVE DISORDER 04/04/2007   Qualifier: History of  By: [name], [name] E    CAD (coronary artery disease), native coronary artery    mild CAD with no obstructive disease by coronary CTA 06/2019   Cataract    removed both eyes   CHF (congestive heart failure) (HCC)    Chronic neck pain 04/30/2015   Cervical spondylosis [place]  neurosurgery Intermittent numbness and tingling in occipital region and headaches S/p 2 occipital nerve blocks - only temp relief no relief with gabapentin, ultram , brace MRI neck - facet arthrosis    CKD (chronic kidney disease) 04/26/2015   COPD (chronic obstructive pulmonary disease) (HCC)    pt denies - told this while was smoking -   Cough 04/26/2015   Depression 07/01/2017   Diabetes (HCC) 04/27/2015   Diagnosed 03/2015    Dizziness 06/10/2017   Dysphagia 04/26/2015   DYSPNEA 12/24/2007   Qualifier: Diagnosis of  By: [name]     Elevated TSH 08/09/2015   GASTRIC ULCER, ACUTE 04/07/2007   Annotation: EGD  04/07/07 Qualifier: Diagnosis of  By: [name], NOLIA Pitts E    GASTRITIS 04/07/2007   Annotation: EGD 04/07/07 Qualifier: Diagnosis of  By: [name], NOLIA Pitts E    GERD (gastroesophageal reflux disease)    H/O renal cell carcinoma 05/23/2017   Dx 2010, s/p cryoablation Follows with urology - Dr [name]   HIATAL HERNIA 04/07/2007   Annotation: EGD 04/07/07 Qualifier: Diagnosis of  By: [name], NOLIA Pitts E    History of CVA (cerebrovascular accident) 04/26/2015   History of gout 04/26/2015   Hyperlipidemia    Hypertension    Hypoxia 03/06/2023   Irritable bowel syndrome 04/04/2007   Qualifier: Diagnosis of  By: [name], NOLIA Pitts BRAVO    Kidney tumor (benign)    Nausea and vomiting 09/09/2017   Occipital neuralgia 05/09/2016   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 Dr [name] in [place]   OSTEOARTHRITIS 04/04/2007   Qualifier: Diagnosis of  By: [name], NOLIA Pitts BRAVO    Personal history of colonic adenoma 06/07/2012   POSTNASAL DRIP SYNDROME 12/29/2007   Qualifier: Diagnosis of  By: [name], [name]     RESTLESS LEG SYNDROME 04/04/2007   Qualifier: Diagnosis of  By: [name], NOLIA Pitts BRAVO    RHINOSINUSITIS, 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   Qualifier: Diagnosis of  By: [name], NOLIA Pitts BRAVO    Sleep difficulties 09/09/2017   Snoring 04/26/2015   Stroke  (HCC)    x2 2011   Thoracic aortic aneurysm 02/06/2018   Seen on MRI 01/25/18 of thoracic spine ordered by [facility] -- referred to CTS to monitor  -- 3.5 cm   TIA (transient ischemic attack)    TOBACCO ABUSE 12/17/2007   Qualifier: Diagnosis of  By: [name], [name]     Trigeminal neuralgia of right side of face 02/08/2016   URI (upper respiratory infection) 02/08/2016   Vitamin D  deficiency 03/12/2018    Past Surgical History:  Procedure Laterality Date   ABDOMINAL HYSTERECTOMY     APPENDECTOMY     APPLICATION OF WOUND VAC Left 10/09/2023   Procedure: APPLICATION, WOUND VAC;  Surgeon: [name], MD;  Location: [facility] OR;  Service: Orthopedics;  Laterality: Left;   CATARACT EXTRACTION     COLONOSCOPY     CYSTOCELE REPAIR     ESOPHAGOGASTRODUODENOSCOPY     FRACTURE SURGERY  2007   INCISION AND DRAINAGE OF DEEP ABSCESS, CALF Left 10/09/2023   Procedure: INCISION AND DRAINAGE OF DEEP ABSCESS, CALF;  Surgeon: [name], MD;  Location: [facility] OR;  Service: Orthopedics;  Laterality: Left;   INTRAMEDULLARY (IM) NAIL INTERTROCHANTERIC Left 10/24/2023   Procedure: FIXATION, FRACTURE, INTERTROCHANTERIC, WITH INTRAMEDULLARY ROD;  Surgeon: [name], MD;  Location: [facility] OR;  Service: Orthopedics;  Laterality: Left;   KIDNEY SURGERY     benign tumor removal   ORTHOPEDIC SURGERY     Wrist & elbow   POLYPECTOMY     RECTOCELE REPAIR     TONSILECTOMY, ADENOIDECTOMY, BILATERAL MYRINGOTOMY AND TUBES Bilateral 1968   UPPER GASTROINTESTINAL ENDOSCOPY       reports that she quit smoking about 14 years ago. Her smoking use included cigarettes. She started smoking about 60 years ago. She has a 46 pack-year smoking history. She has been exposed to tobacco smoke. She has never used smokeless tobacco. She reports that she does not drink alcohol  and does not use drugs.  Family History  Problem Relation Age of Onset   Congestive Heart Failure Mother  Cancer Mother        ovarian   Asthma  Mother    Heart disease Mother    Heart disease Father    Diabetes Father    Kidney disease Father    Hyperlipidemia Father    Stroke Father    Diabetes Sister    Kidney disease Sister    Miscarriages / Stillbirths Sister    Arthritis Maternal Grandmother    Hyperlipidemia Maternal Grandmother    Hypertension Maternal Grandmother    Obesity Maternal Grandmother    Arthritis Paternal Grandfather    Hypertension Paternal Grandfather    Kidney disease Paternal Grandfather    Diabetes Paternal Aunt    Diabetes Paternal Uncle    Learning disabilities Brother    Anxiety disorder Son    Diabetes Son    Learning disabilities Son    Depression Maternal Aunt    Colon cancer Neg Hx    Colon polyps Neg Hx    Esophageal cancer Neg Hx    Rectal cancer Neg Hx    Stomach cancer Neg Hx      Physical Exam: Vitals:   12/20/23 2300 12/20/23 2315 12/21/23 0000 12/21/23 0012  BP: 125/67 139/75 104/69   Pulse: (!) 101 (!) 101 94   Resp: (!) 24 (!) 26 17   Temp:    97.9 F (36.6 C)  TempSrc:    Oral  SpO2: 93% 94% 92%   Weight:      Height:        Gen: Awake, alert, chronically ill appearing  CV: Regular, normal S1, S2, no murmurs  Resp: Normal WOB, on 4L [place]. Slightly diminished air movement, no wheezing.  Abd: Flat, normoactive, nontender MSK: Symmetric, no edema, there is spasm in the muscles of the hamstring with flexion at the hip. There is leg length discrepancy with shortened LLE. Straight leg raise with some reproduction in pain on the left.  Skin: No rashes or lesions to exposed skin  Neuro: Alert and interactive  Psych: anxious     Data review:   Labs reviewed, notable for:   Cr 2.18 (b/l ~1.3 - 1.4)  BNP 29  HS trop 12  Lactate 1  Hb 14, likely hemoconcentrated  D dimer 1.83    Micro:  Results for orders placed or performed during the hospital encounter of 12/20/23  Resp panel by RT-PCR (RSV, Flu A&B, Covid) Anterior Nasal Swab     Status: None   Collection  Time: 12/20/23  3:58 PM   Specimen: Anterior Nasal Swab  Result Value Ref Range Status   SARS Coronavirus 2 by RT PCR NEGATIVE NEGATIVE Final   Influenza A by PCR NEGATIVE NEGATIVE Final   Influenza B by PCR NEGATIVE NEGATIVE Final    Comment: (NOTE) The Xpert Xpress SARS-CoV-2/FLU/RSV plus assay is intended as an aid in the diagnosis of influenza from Nasopharyngeal swab specimens and should not be used as a sole basis for treatment. Nasal washings and aspirates are unacceptable for Xpert Xpress SARS-CoV-2/FLU/RSV testing.  Fact Sheet for Patients: bloggercourse.com  Fact Sheet for Healthcare Providers: seriousbroker.it  This test is not yet approved or cleared by the [place]  FDA and has been authorized for detection and/or diagnosis of SARS-CoV-2 by FDA under an Emergency Use Authorization (EUA). This EUA will remain in effect (meaning this test can be used) for the duration of the COVID-19 declaration under Section 564(b)(1) of the Act, 21 U.S.C. section 360bbb-3(b)(1), unless the authorization is terminated or revoked.  Resp Syncytial Virus by PCR NEGATIVE NEGATIVE Final    Comment: (NOTE) Fact Sheet for Patients: bloggercourse.com  Fact Sheet for Healthcare Providers: seriousbroker.it  This test is not yet approved or cleared by the [place]  FDA and has been authorized for detection and/or diagnosis of SARS-CoV-2 by FDA under an Emergency Use Authorization (EUA). This EUA will remain in effect (meaning this test can be used) for the duration of the COVID-19 declaration under Section 564(b)(1) of the Act, 21 U.S.C. section 360bbb-3(b)(1), unless the authorization is terminated or revoked.  Performed at [facility], [address], [place], [name] [address]     Imaging reviewed:  CT Head Wo Contrast Result Date: 12/20/2023 EXAM: CT HEAD WITHOUT  CONTRAST 12/20/2023 09:52:37 PM TECHNIQUE: CT of the head was performed without the administration of intravenous contrast. Automated exposure control, iterative reconstruction, and/or weight based adjustment of the mA/kV was utilized to reduce the radiation dose to as low as reasonably achievable. COMPARISON: 10/21/2023 CLINICAL HISTORY: Memory loss. Pt states multiple falls and hallucinations. FINDINGS: BRAIN AND VENTRICLES: No acute hemorrhage. No evidence of acute infarct. 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 Stable remote lacunar infarct in left lentiform nucleus. Stable white matter changes compared to prior study. No hydrocephalus. No extra-axial collection. No mass effect or midline shift. Atherosclerotic calcifications in bilateral cavernous internal carotid arteries. ORBITS: Bilateral lens replacements noted. SINUSES: Fluid in posterior left ethmoid air cells. SOFT TISSUES AND SKULL: No acute soft tissue abnormality. No skull fracture. IMPRESSION: 1. No acute intracranial abnormality. Electronically signed by: Pinkie Pebbles MD 12/20/2023 09:56 PM EDT RP Workstation: [id number]   DG Chest Portable 1 View Result Date: 12/20/2023 CLINICAL DATA:  Shortness of breath.  Status post fall, pain. EXAM: PORTABLE CHEST 1 VIEW COMPARISON:  Radiograph 10/21/2023, CT 12/04/2023 FINDINGS: Hyperinflation and emphysema. Normal heart size with stable mediastinal contours. Aortic atherosclerosis. No confluent opacity, large pleural effusion or pneumothorax. No pulmonary edema. On limited assessment, no acute osseous findings. IMPRESSION: Hyperinflation and emphysema. No acute findings. Electronically Signed   By: Andrea Gasman M.D.   On: 12/20/2023 17:19   DG Pelvis 1-2 Views Result Date: 12/20/2023 CLINICAL DATA:  Pain after fall. EXAM: DG PELVIS 1-2V COMPARISON:  Radiograph 10/21/2023 FINDINGS: Femoral intramedullary nail and trans trochanteric screw fixation of previous  intertrochanteric femur fracture. Interval fracture healing from initial injury radiograph. The hardware is intact were visualized. No evidence of acute pelvic fracture. Pubic rami are intact. No pubic symphyseal or sacroiliac diastasis. IMPRESSION: 1. No acute pelvic fracture. 2. ORIF of previous intertrochanteric femur fracture. Electronically Signed   By: Andrea Gasman M.D.   On: 12/20/2023 17:16   DG Tibia/Fibula Left Result Date: 12/20/2023 EXAM: 2 VIEW(S) XRAY OF THE LEFT TIBIA AND FIBULA 12/20/2023 04:46:00 PM COMPARISON: None available. CLINICAL HISTORY: Pain after fall. Fall w/ pain. FINDINGS: BONES AND JOINTS: Degenerative changes of the knee and ankle. On the lateral view only, there is a subtle cortical defect along the anterior aspect of the lateral malleolus. This is not confirmed on any other view. The bones are osteopenic. No joint dislocation. SOFT TISSUES: The soft tissues are unremarkable. IMPRESSION: 1. Subtle cortical defect along the anterior aspect of the lateral malleolus on the lateral view only, not confirmed on other views. A subtle nondisplaced fracture cannot be excluded. If there is high clinical concern for an acute fracture at this level, recommend recommend dedicated images of the left ankle. 2. Osteopenia. Electronically signed by: Greig Pique  MD 12/20/2023 05:16 PM EDT RP Workstation: [id number]    EKG:  Personally reviewed,  SR , borderline 1st deg AVB, no acute ischemic changes    ED Course:   Treated with fentanyl , 500 cc NS    Assessment/Plan:  78 y.o. 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   AoCHRF 2-> 4L O2   + Ddimer  Although not main c/o, has weeks of more SOB, requiring increase in home O2 2->4L.  Intermittently tachycardic in low 100s. BNP, HS trop wnl. Ddimer +. Flu / COVID / RSV neg. CXR hyperinflation, no infiltrates. Etiology unclear, I suspect may be related to her underlying COPD, hypoventilation, atelectasis with decreased mobility. PE indeterminate risk although appears less likely    -- VQ scan r/o PE  -- Venous duplex LE  -- Continue home long acting inhalers for COPD. Nebs prn, IS, OOB to chair.  -- Home O2 screen prior to discharge.   Ground level fall Leg pain   Pain mainly around the left hip. Imaging with CT Head, CXR, Pelvic XR without acute injuries. L tib / fib XR with ? Defect at the lateral malleolus.  -- XR of femur/ L ankle to complete plain film imaging.  -- Symptomatic management, tylenol  prn, methocarbamol  prn spasm, oxycodone  2.5/5 mg q 6 hr prn mod/severe  -- PT / OT evaluation   Acute kidney stage 1  Baseline Cr 1.3, up to 2.1 on admission, suspect prerenal.  -- s/p 500 cc IVF, give an additional 1 L then continue oral hydration  -- check PVR   Hallucinations, ? Possible underlying neurocognitive disorder  Reports having formed hallucinations, seeing people that are not there. Some issues with memory.  -- Delirium, fall precautions, melatonin at bedtime maintain sleep/wake cycle  -- Recommend OP f/u with neurology / neuropsych for eval for underlying neurocognitive disorder.   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  Aortic mural thrombus: Not on AC. May cause her + ddimer.  PAD: See AntiPLT/statin above  DM2: On GLP1 OP. SSI for very sensitive inpatient.  HTN: Hold amlodipine , Telmisartan  for now  HLD: Statin per abvoe  CKD3b: Baseline Cr  Anemia: Hb 14 up from prior, likely hemoconcentrated. Will likely fall back towards 12  Obesity:  On GLP1 outpatient.  Gout: Continue home allopurinol   Mood d/o: continue home Duloxetine   hx recent fall and L femur Fx S/p ORIF: Noted, stable via pelvic imaging.    Body mass index is 31.11 kg/m. Obesity class I would benefit from weight loss outpatient    DVT prophylaxis:  SQ Heparin  Code Status: DNR/DNI, confirmed with patient  Diet:  Diet Orders (From admission, onward)    None      Family Communication:  None   Consults:  None   Admission status:   Inpatient, Telemetry bed  Severity of Illness: The appropriate patient status for this patient is INPATIENT. Inpatient status is judged to be reasonable and necessary in order to provide the required intensity of service to ensure the patient's safety. The patient's presenting  symptoms, physical exam findings, and initial radiographic and laboratory data in the context of their chronic comorbidities is felt to place them at high risk for further clinical deterioration. Furthermore, it is not anticipated that the patient will be medically stable for discharge from the hospital within 2 midnights of admission.   * I certify that at the point of admission it is my clinical judgment that the patient will require inpatient hospital care spanning beyond 2 midnights from the point of admission due to high intensity of service, high risk for further deterioration and high frequency of surveillance required.*   Dorn Dawson, MD [facility]  How to contact the [facility] Attending or Consulting provider 7A - 7P or covering provider during after hours 7P -7A, for this patient.  Check the care team in [facility] and look for a) attending/consulting [facility] provider listed and b) the [facility] team listed Log into [email or web address] and use [facility]'s universal password to access. If you do not have the password, please contact the hospital operator. Locate the [facility] provider you are looking for under [facility] and page to a number that you can be  directly reached. If you still have difficulty reaching the provider, please page the [facility] (Director on Call) for the Hospitalists listed on amion for assistance.  12/21/2023, 1:09 AM

## 2023-12-21 NOTE — Progress Notes (Signed)
 Courtesy visit- No billing  Patient is seen and examined today morning.  Patient is admitted to the hospitalist service for further management evaluation of acute on chronic hypoxic respiratory failure, rule out PE, fall, AKI, confusion.  She seems to be pleasantly confused, denies any shortness of breath.  She is not agitated, does not seem to be anxious.  Patient will be continued on supplemental oxygen to maintain saturation greater than 92%.  Continue bronchodilators for COPD, VQ scan pending rule out PE.  Lower extremity Dopplers pending.  Patient had x-ray femur left ankle seems unremarkable for fracture.  Kidney function improving.  Will get PT OT evaluation for discharge planning.

## 2023-12-22 ENCOUNTER — Inpatient Hospital Stay (HOSPITAL_COMMUNITY)

## 2023-12-22 DIAGNOSIS — N179 Acute kidney failure, unspecified: Secondary | ICD-10-CM | POA: Diagnosis not present

## 2023-12-22 DIAGNOSIS — E876 Hypokalemia: Secondary | ICD-10-CM

## 2023-12-22 DIAGNOSIS — J9621 Acute and chronic respiratory failure with hypoxia: Secondary | ICD-10-CM | POA: Diagnosis not present

## 2023-12-22 DIAGNOSIS — R609 Edema, unspecified: Secondary | ICD-10-CM | POA: Diagnosis not present

## 2023-12-22 DIAGNOSIS — R419 Unspecified symptoms and signs involving cognitive functions and awareness: Secondary | ICD-10-CM | POA: Diagnosis not present

## 2023-12-22 DIAGNOSIS — N189 Chronic kidney disease, unspecified: Secondary | ICD-10-CM

## 2023-12-22 DIAGNOSIS — M79605 Pain in left leg: Secondary | ICD-10-CM | POA: Diagnosis not present

## 2023-12-22 LAB — BASIC METABOLIC PANEL WITH GFR
Anion gap: 11 (ref 5–15)
BUN: 11 mg/dL (ref 8–23)
CO2: 23 mmol/L (ref 22–32)
Calcium: 9.6 mg/dL (ref 8.9–10.3)
Chloride: 104 mmol/L (ref 98–111)
Creatinine, Ser: 1.77 mg/dL — ABNORMAL HIGH (ref 0.44–1.00)
GFR, Estimated: 29 mL/min — ABNORMAL LOW (ref >60)
Glucose, Bld: 90 mg/dL (ref 70–99)
Potassium: 3.6 mmol/L (ref 3.5–5.1)
Sodium: 138 mmol/L (ref 135–145)

## 2023-12-22 LAB — URINALYSIS, ROUTINE W REFLEX MICROSCOPIC
Bacteria, UA: NONE SEEN
Bilirubin Urine: NEGATIVE
Glucose, UA: >=500 mg/dL — AB
Ketones, ur: NEGATIVE mg/dL
Nitrite: NEGATIVE
Protein, ur: 100 mg/dL — AB
RBC / HPF: >50 RBC/hpf (ref 0–5)
Specific Gravity, Urine: 1.01 (ref 1.005–1.030)
WBC, UA: >50 WBC/hpf (ref 0–5)
pH: 6 (ref 5.0–8.0)

## 2023-12-22 LAB — GLUCOSE, CAPILLARY
Glucose-Capillary: 100 mg/dL — ABNORMAL HIGH (ref 70–99)
Glucose-Capillary: 105 mg/dL — ABNORMAL HIGH (ref 70–99)
Glucose-Capillary: 91 mg/dL (ref 70–99)
Glucose-Capillary: 116 mg/dL — ABNORMAL HIGH (ref 70–99)
Glucose-Capillary: 87 mg/dL (ref 70–99)

## 2023-12-22 MED ORDER — FUROSEMIDE 20 MG PO TABS
20.0000 mg | ORAL_TABLET | Freq: Every day | ORAL | Status: DC
  Administered 2023-12-22 – 2023-12-23 (×2): 20 mg via ORAL
  Filled 2023-12-22 (×3): qty 1

## 2023-12-22 MED ORDER — POTASSIUM CHLORIDE 20 MEQ PO PACK
40.0000 meq | PACK | Freq: Once | ORAL | Status: AC
  Administered 2023-12-22: 40 meq via ORAL
  Filled 2023-12-22: qty 2

## 2023-12-22 MED ORDER — OXYCODONE HCL 5 MG PO TABS
5.0000 mg | ORAL_TABLET | ORAL | Status: DC | PRN
  Administered 2023-12-22 – 2023-12-24 (×5): 5 mg via ORAL
  Filled 2023-12-22 (×5): qty 1

## 2023-12-22 MED ORDER — POTASSIUM CHLORIDE CRYS ER 20 MEQ PO TBCR
20.0000 meq | EXTENDED_RELEASE_TABLET | Freq: Every day | ORAL | Status: DC
  Administered 2023-12-23 – 2023-12-31 (×9): 20 meq via ORAL
  Filled 2023-12-22 (×9): qty 1

## 2023-12-22 NOTE — Evaluation (Signed)
 Physical Therapy Evaluation Patient Details Name: Kerry Powell MRN: [id number] DOB: 12/23/1945 Today's Date: 12/22/2023  History of Present Illness  Pt is 78 yo presenting to [facility] on 10/24 due to AMS and increased shortness of breathe with exertion and increased O2 demands. PMH includes Recent IMN of the L femur on 8/28, DMII, CKD, HFpEF, COPD.  Clinical Impression  Pt is presenting at Min A for sit to stand with RW and Min A for short non-functional in-home distances at this time with RW. Pt limited by fatigue/pain that radiates down from the hip to the knee. Pt reports no pain in sitting. Pt normally cares for spouse and can get a little assistance from son/daughter. Due to pt current functional status, home set up and available assistance at home recommending skilled physical therapy services < 3 hours/day in order to address strength, balance and functional mobility to decrease risk for falls, injury, immobility, skin break down and re-hospitalization.          If plan is discharge home, recommend the following: Assist for transportation;Help with stairs or ramp for entrance;Assistance with cooking/housework   Can travel by private vehicle   Yes    Equipment Recommendations None recommended by PT     Functional Status Assessment Patient has had a recent decline in their functional status and demonstrates the ability to make significant improvements in function in a reasonable and predictable amount of time.     Precautions / Restrictions Precautions Precautions: Fall Recall of Precautions/Restrictions: Impaired Restrictions Weight Bearing Restrictions Per Provider Order: No      Mobility  Bed Mobility       General bed mobility comments: sitting in recliner on arrival and departure.    Transfers Overall transfer level: Needs assistance Equipment used: Rolling walker (2 wheels) Transfers: Sit to/from Stand Sit to Stand: Min assist           General transfer  comment: Min A for 2x sit to stand from recliner to RW with verbal cues for sequencing and hand placement.    Ambulation/Gait Ambulation/Gait assistance: Min assist Gait Distance (Feet): 30 Feet Assistive device: Rolling walker (2 wheels) Gait Pattern/deviations: Step-through pattern, Decreased stance time - left, Antalgic, Trendelenburg Gait velocity: decreased Gait velocity interpretation: <1.31 ft/sec, indicative of household ambulator   General Gait Details: antalgic gait pattern with short step length, Min A for balance, support due to buckling at the L knee. Pt limited by pain/fatigue.     Balance Overall balance assessment: Needs assistance Sitting-balance support: Single extremity supported, Feet supported Sitting balance-Leahy Scale: Fair     Standing balance support: Bilateral upper extremity supported, During functional activity, Reliant on assistive device for balance Standing balance-Leahy Scale: Poor Standing balance comment: Min A for balance       Pertinent Vitals/Pain Pain Assessment Pain Assessment: 0-10 Pain Score: 10-Worst pain ever Pain Location: L hip/knee only with ambulation, reports no pain in sitting Pain Descriptors / Indicators: Aching, Burning Pain Intervention(s): Monitored during session, Limited activity within patient's tolerance, Repositioned (reports 0 pain in sitting at end of session)    Home Living Family/patient expects to be discharged to:: Private residence Living Arrangements: Spouse/significant other;Children Available Help at Discharge: Family;Available 24 hours/day Type of Home: House Home Access: Ramped entrance       Home Layout: Two level;Able to live on main level with bedroom/bathroom Home Equipment: Grab bars - tub/shower;Shower Counsellor (2 wheels);Cane - single point;Rollator (4 wheels);Hand held shower head;Wheelchair - manual Additional Comments: information  taken from previous chart due to pt reports she  was at a nursing facility prior but notes say she was at home.    Prior Function Prior Level of Function : Independent/Modified Independent;Driving             Mobility Comments: pt reports utilizing a RW ADLs Comments: Per notes pt has required increased assistance at home.  At baseline she assists him with his ADL's. Son is helping with ADLs and transportation as of recent.  Daughter can assist as well PRN     Extremity/Trunk Assessment   Upper Extremity Assessment Upper Extremity Assessment: Defer to OT evaluation;Generalized weakness    Lower Extremity Assessment Lower Extremity Assessment: Generalized weakness    Cervical / Trunk Assessment Cervical / Trunk Assessment: Kyphotic  Communication   Communication Communication: No apparent difficulties    Cognition Arousal: Alert Behavior During Therapy: WFL for tasks assessed/performed   PT - Cognitive impairments: Awareness, Memory, Problem solving, Safety/Judgement       PT - Cognition Comments: Pt seems to be confused as to why she is at the hospital and stating she was at a nursing facility prior to hospitalization but notes say pt was at home Following commands: Intact       Cueing Cueing Techniques: Verbal cues, Tactile cues     General Comments General comments (skin integrity, edema, etc.): HR up to 123 bpm during short distance gait.        Assessment/Plan    PT Assessment Patient needs continued PT services  PT Problem List Decreased strength;Decreased activity tolerance;Decreased balance;Decreased mobility;Decreased safety awareness;Pain;Cardiopulmonary status limiting activity       PT Treatment Interventions DME instruction;Balance training;Gait training;Stair training;Functional mobility training;Therapeutic activities;Therapeutic exercise;Patient/family education    PT Goals (Current goals can be found in the Care Plan section)  Acute Rehab PT Goals Patient Stated Goal: Decrease pain PT  Goal Formulation: With patient Time For Goal Achievement: 01/05/24 Potential to Achieve Goals: Good    Frequency Min 2X/week        AM-PAC PT 6 Clicks Mobility  Outcome Measure Help needed turning from your back to your side while in a flat bed without using bedrails?: A Little Help needed moving from lying on your back to sitting on the side of a flat bed without using bedrails?: A Little Help needed moving to and from a bed to a chair (including a wheelchair)?: A Little Help needed standing up from a chair using your arms (e.g., wheelchair or bedside chair)?: A Little Help needed to walk in hospital room?: A Little Help needed climbing 3-5 steps with a railing? : A Lot 6 Click Score: 17    End of Session Equipment Utilized During Treatment: Gait belt Activity Tolerance: Patient tolerated treatment well;Patient limited by fatigue;Patient limited by pain Patient left: in chair;with call bell/phone within reach;with chair alarm set Nurse Communication: Mobility status PT Visit Diagnosis: Unsteadiness on feet (R26.81);Other abnormalities of gait and mobility (R26.89);Pain;Muscle weakness (generalized) (M62.81) Pain - Right/Left: Left Pain - part of body: Hip;Knee    Time: 1101-1119 PT Time Calculation (min) (ACUTE ONLY): 18 min   Charges:   PT Evaluation $PT Eval Low Complexity: 1 Low   PT General Charges $$ ACUTE PT VISIT: 1 Visit         Dorothyann Maier, DPT, CLT  Acute Rehabilitation Services Office: [phone] (Secure chat preferred)   Dorothyann VEAR Maier 12/22/2023, 1:35 PM

## 2023-12-22 NOTE — Progress Notes (Signed)
 Bilateral lower extremity venous duplex has been completed. Preliminary results can be found in CV Proc through chart review.   12/22/23 2:33 PM Cathlyn Collet RVT

## 2023-12-22 NOTE — Progress Notes (Signed)
 Progress Note   Patient: Kerry Powell FMW:[id number] DOB: 1946/01/19 DOA: 12/20/2023     2 DOS: the patient was seen and examined on 12/22/2023   Brief hospital course: AUDI CONOVER is a 78 y.o. 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  Patient is admitted to TRH service for further management evaluation of respiratory failure, positive D-dimer, left lower extremity pain status post fall, AKI  Assessment and Plan: Acute on chronic hypoxic respiratory failure- Patient presented with worsening shortness of breath, tachycardia, increased oxygen requirement.  This is possibly due to underlying COPD, hypoventilation, atelectasis. D-dimer is elevated we will get VQ scan rule out PE. Venous Doppler negative for lower extremity DVT. Continue COPD inhalers and nebulizers as needed. Encourage out of bed to chair, PT OT. Continue home oxygen to maintain saturation greater than 92%.  Left lower extremity pain Status post ground-level fall X-ray femur and left ankle imaging unremarkable for any fracture. Continue Tylenol , Robaxin  and oxycodone  for pain control. PT OT evaluated the patient, she did complain of left hip pain radiating to her knee.  Will get lumbar spine x-ray.  Acute on chronic kidney disease stage IIIb- Patient's baseline creatinine around 1.5-1.7. Patient presented with creatinine of 2.18. Creatinine did improve, today 1.77. Avoid nephrotoxic drugs. Continue to monitor daily renal function, electrolytes.  Hypokalemia-oral potassium supplementation ordered.  Neurocognitive disorder- Patient does report confusion, hallucinations.  Seems to have dementia. Continue supportive care, fall, aspiration precautions Delirium  precautions. Patient will need outpatient neurology and geropsych evaluation.  COPD Chronic respiratory failure with hypoxia on 2 L supplemental oxygen. Continue bronchodilators, home inhalers.  HFpEF-no acute exacerbation.  Plan to resume Lasix  today.  CAD/PAD-continue aspirin, Plavix , statin. Hypertension-hold telmisartan , resumed metoprolol . Hyperlipidemia-continue statin therapy.  Type 2 diabetes mellitus-continue Accu-Cheks, sliding scale insulin .     PT OT evaluation for discharge planning. Out of bed to chair. Incentive spirometry. Nursing supportive care. Fall, aspiration precautions. Diet:  Diet Orders (From admission, onward)     Start     Ordered   12/21/23 0138  Diet regular Room service appropriate? Yes; Fluid consistency: Thin  Diet effective now       Question Answer Comment  Room service appropriate? Yes   Fluid consistency: Thin      12/21/23 0138           DVT prophylaxis: heparin  injection 5,000 Units Start: 12/21/23 0600  Level of care: Telemetry Medical   Code Status: Limited: Do not attempt resuscitation (DNR) -DNR-LIMITED -Do Not Intubate/DNI   Subjective: Patient is seen and examined today morning.  She is able to answer me slowly.  Did complain of left leg pain, unable to ambulate.  Shortness of breath improved.  Eating poor.  More confused and hallucinated last night per RN.  Physical Exam: Vitals:   12/22/23 0731 12/22/23 0733 12/22/23 0847 12/22/23 1152  BP:   (!) 145/85 (!) 145/86  Pulse:   100 97  Resp:   18 16  Temp:   97.6 F (36.4 C) 98.1 F (36.7 C)  TempSrc:   Oral Oral  SpO2: 95% 96% 91% 94%  Weight:      Height:        General - Elderly obese Caucasian female,  sitting calm, no distress HEENT - PERRLA, EOMI, atraumatic head, non tender sinuses. Lung - Clear, basal rales, no rhonchi, wheezes. Heart - S1, S2 heard, no murmurs, rubs, 1+ pedal edema. Abdomen - Soft, non tender, bowel sounds good Neuro - Alert, awake and  pleasantly confused, non focal exam. Skin - Warm and dry.  Data Reviewed:      Latest Ref Rng & Units 12/21/2023    4:30 AM 12/20/2023    7:57 PM 11/22/2023    3:25 PM  CBC  WBC 4.0 - 10.5 K/uL 9.3  8.4  7.0   Hemoglobin 12.0 - 15.0 g/dL 86.9  85.7  87.0   Hematocrit 36.0 - 46.0 % 41.4  46.1  41.3   Platelets 150 - 400 K/uL 233  279  323.0       Latest Ref Rng & Units 12/22/2023    4:14 AM 12/21/2023    4:30 AM 12/20/2023    7:57 PM  BMP  Glucose 70 - 99 mg/dL 90  92  77   BUN 8 - 23 mg/dL 11  13  16    Creatinine 0.44 - 1.00 mg/dL 8.22  8.05  7.81   Sodium 135 - 145 mmol/L 138  135  135   Potassium 3.5 - 5.1 mmol/L 3.6  3.7  4.0   Chloride 98 - 111 mmol/L 104  101  99   CO2 22 - 32 mmol/L 23  21  22    Calcium  8.9 - 10.3 mg/dL 9.6  9.3  9.8    DG FEMUR PORT MIN 2 VIEWS LEFT Result Date: 12/21/2023 EXAM: 2 VIEW(S) XRAY OF THE LEFT FEMUR 12/21/2023 01:54:23 AM COMPARISON: Comparison left hip x-ray 10/24/1998 and 2025. CLINICAL HISTORY: 00502 Leg pain 99497. Leg pain FINDINGS: BONES AND JOINTS: Left-sided hip screw appears uncomplicated. No evidence for hardware loosening or acute fracture. Joint spaces are maintained. There are mild degenerative changes of the medial compartment of the knee. No focal osseous lesion. No joint dislocation. SOFT TISSUES: The soft tissues are unremarkable. IMPRESSION: 1. No acute fracture or hardware complication. 2. Mild degenerative changes of the medial compartment of the knee. Electronically signed by: Greig Pique MD 12/21/2023 02:04 AM EDT RP Workstation: [id number]   DG Ankle Complete Left Result Date: 12/21/2023 EXAM: 3 OR MORE VIEW(S) XRAY OF THE LEFT ANKLE 12/21/2023 01:54:23 AM CLINICAL HISTORY: Leg pain. COMPARISON: None available. FINDINGS: BONES AND JOINTS: No acute fracture. No joint dislocation. Mild Tibiotalar degenerative changes. Mild degenerative changes of the dorsal midfoot. Small posterior calcaneal enthesophyte. SOFT TISSUES: The  soft tissues are unremarkable. IMPRESSION: 1. No acute osseous abnormality. Electronically signed by: Pinkie Pebbles MD 12/21/2023 02:02 AM EDT RP Workstation: [id number]   CT Head Wo Contrast Result Date: 12/20/2023 EXAM: CT HEAD WITHOUT CONTRAST 12/20/2023 09:52:37 PM TECHNIQUE: CT of the head was performed without the administration of intravenous contrast. Automated exposure control, iterative reconstruction, and/or weight based adjustment of the mA/kV was utilized to reduce the radiation dose to as low as reasonably achievable. COMPARISON: 10/21/2023 CLINICAL HISTORY: Memory loss. Pt states multiple falls and hallucinations. FINDINGS: BRAIN AND VENTRICLES: No acute hemorrhage. No evidence of acute infarct. 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 Stable remote lacunar infarct in left lentiform nucleus. Stable white matter changes compared to prior study. No hydrocephalus. No extra-axial collection. No mass effect or midline shift. Atherosclerotic calcifications in bilateral cavernous internal carotid arteries. ORBITS: Bilateral lens replacements noted. SINUSES: Fluid in posterior left ethmoid air cells. SOFT TISSUES  AND SKULL: No acute soft tissue abnormality. No skull fracture. IMPRESSION: 1. No acute intracranial abnormality. Electronically signed by: Pinkie Pebbles MD 12/20/2023 09:56 PM EDT RP Workstation: [id number]   DG Chest Portable 1 View Result Date: 12/20/2023 CLINICAL DATA:  Shortness of breath.  Status post fall, pain. EXAM: PORTABLE CHEST 1 VIEW COMPARISON:  Radiograph 10/21/2023, CT 12/04/2023 FINDINGS: Hyperinflation and emphysema. Normal heart size with stable mediastinal contours. Aortic atherosclerosis. No confluent opacity, large pleural effusion or pneumothorax. No pulmonary edema. On limited assessment, no acute osseous findings. IMPRESSION: Hyperinflation and emphysema. No acute findings. Electronically Signed   By: Andrea Gasman M.D.   On:  12/20/2023 17:19   DG Pelvis 1-2 Views Result Date: 12/20/2023 CLINICAL DATA:  Pain after fall. EXAM: DG PELVIS 1-2V COMPARISON:  Radiograph 10/21/2023 FINDINGS: Femoral intramedullary nail and trans trochanteric screw fixation of previous intertrochanteric femur fracture. Interval fracture healing from initial injury radiograph. The hardware is intact were visualized. No evidence of acute pelvic fracture. Pubic rami are intact. No pubic symphyseal or sacroiliac diastasis. IMPRESSION: 1. No acute pelvic fracture. 2. ORIF of previous intertrochanteric femur fracture. Electronically Signed   By: Andrea Gasman M.D.   On: 12/20/2023 17:16   DG Tibia/Fibula Left Result Date: 12/20/2023 EXAM: 2 VIEW(S) XRAY OF THE LEFT TIBIA AND FIBULA 12/20/2023 04:46:00 PM COMPARISON: None available. CLINICAL HISTORY: Pain after fall. Fall w/ pain. FINDINGS: BONES AND JOINTS: Degenerative changes of the knee and ankle. On the lateral view only, there is a subtle cortical defect along the anterior aspect of the lateral malleolus. This is not confirmed on any other view. The bones are osteopenic. No joint dislocation. SOFT TISSUES: The soft tissues are unremarkable. IMPRESSION: 1. Subtle cortical defect along the anterior aspect of the lateral malleolus on the lateral view only, not confirmed on other views. A subtle nondisplaced fracture cannot be excluded. If there is high clinical concern for an acute fracture at this level, recommend recommend dedicated images of the left ankle. 2. Osteopenia. Electronically signed by: Greig Pique MD 12/20/2023 05:16 PM EDT RP Workstation: [id number]    Family Communication: No family at bedside Disposition: Status is: Inpatient Remains inpatient appropriate because: Confusion, VQ scan, spine imaging  Planned Discharge Destination: Home with Home Health     Time spent: 42 minutes  Author: Concepcion Riser, MD 12/22/2023 1:45 PM Secure chat 7am to 7pm For on call  review [email or web address].

## 2023-12-22 NOTE — Plan of Care (Signed)

## 2023-12-23 ENCOUNTER — Encounter (HOSPITAL_COMMUNITY): Payer: Self-pay | Admitting: Internal Medicine

## 2023-12-23 ENCOUNTER — Inpatient Hospital Stay (HOSPITAL_COMMUNITY)

## 2023-12-23 DIAGNOSIS — M79605 Pain in left leg: Secondary | ICD-10-CM | POA: Diagnosis not present

## 2023-12-23 DIAGNOSIS — R419 Unspecified symptoms and signs involving cognitive functions and awareness: Secondary | ICD-10-CM | POA: Diagnosis not present

## 2023-12-23 DIAGNOSIS — J9621 Acute and chronic respiratory failure with hypoxia: Secondary | ICD-10-CM | POA: Diagnosis not present

## 2023-12-23 DIAGNOSIS — N179 Acute kidney failure, unspecified: Secondary | ICD-10-CM | POA: Diagnosis not present

## 2023-12-23 LAB — BASIC METABOLIC PANEL WITH GFR
Anion gap: 14 (ref 5–15)
BUN: 11 mg/dL (ref 8–23)
CO2: 27 mmol/L (ref 22–32)
Calcium: 9.7 mg/dL (ref 8.9–10.3)
Chloride: 97 mmol/L — ABNORMAL LOW (ref 98–111)
Creatinine, Ser: 2.05 mg/dL — ABNORMAL HIGH (ref 0.44–1.00)
GFR, Estimated: 24 mL/min — ABNORMAL LOW (ref >60)
Glucose, Bld: 112 mg/dL — ABNORMAL HIGH (ref 70–99)
Potassium: 3.7 mmol/L (ref 3.5–5.1)
Sodium: 138 mmol/L (ref 135–145)

## 2023-12-23 LAB — GLUCOSE, CAPILLARY
Glucose-Capillary: 85 mg/dL (ref 70–99)
Glucose-Capillary: 143 mg/dL — ABNORMAL HIGH (ref 70–99)
Glucose-Capillary: 115 mg/dL — ABNORMAL HIGH (ref 70–99)
Glucose-Capillary: 98 mg/dL (ref 70–99)
Glucose-Capillary: 103 mg/dL — ABNORMAL HIGH (ref 70–99)

## 2023-12-23 MED ORDER — TECHNETIUM TO 99M ALBUMIN AGGREGATED
4.0400 | Freq: Once | INTRAVENOUS | Status: AC | PRN
  Administered 2023-12-23: 4.04 via INTRAVENOUS

## 2023-12-23 MED ORDER — SODIUM CHLORIDE 0.9 % IV SOLN
2.0000 g | INTRAVENOUS | Status: DC
  Administered 2023-12-23 – 2023-12-27 (×5): 2 g via INTRAVENOUS
  Filled 2023-12-23 (×5): qty 20

## 2023-12-23 NOTE — TOC CM/SW Note (Signed)
 RE: Kerry Powell Date of Birth: Jul 16, 1945 Date: 12/23/2023  Please be advised that the above-named patient will require a short-term nursing home stay - anticipated 30 days or less for rehabilitation and strengthening.  The plan is for return home.  Lauraine Saa, MSW, LCSW-A Transitions of Care  Clinical Social Worker I [phone]

## 2023-12-23 NOTE — Plan of Care (Signed)
  Problem: Fluid Volume: Goal: Ability to maintain a balanced intake and output will improve Outcome: Progressing   Problem: Metabolic: Goal: Ability to maintain appropriate glucose levels will improve Outcome: Progressing   Problem: Nutritional: Goal: Maintenance of adequate nutrition will improve Outcome: Progressing   Problem: Skin Integrity: Goal: Risk for impaired skin integrity will decrease Outcome: Progressing   Problem: Clinical Measurements: Goal: Respiratory complications will improve Outcome: Progressing Goal: Cardiovascular complication will be avoided Outcome: Progressing   Problem: Activity: Goal: Risk for activity intolerance will decrease Outcome: Progressing   Problem: Coping: Goal: Level of anxiety will decrease Outcome: Progressing

## 2023-12-23 NOTE — Evaluation (Signed)
 Occupational Therapy Evaluation Patient Details Name: Kerry Powell MRN: [id number] DOB: 04-22-1945 Today's Date: 12/23/2023   History of Present Illness   Pt is 78 yo presenting to [facility] on 10/24 due to AMS and increased shortness of breathe with exertion and increased O2 demands. PMH includes Recent IMN of the L femur on 8/28, DMII, CKD, HFpEF, COPD.     Clinical Impressions Pt reports having assist at baseline for ADLs and was using RW for mobility, lives with her spouse (who is disabled) and her son. Pt currently with LLE pain, limited weightbearing due to pain and states she typically has a shoe that she wears for leg discrepancy, not present at time of evaluation, but asked pt to have family bring if possible. Pt currently needs up to mod A for ADLs, CGA for bed mobility and min A for transfers with RW. VSS on 3L O2 throughout session. Pt presenting with impairments listed below, will follow acutely. Patient will benefit from continued inpatient follow up therapy, <3 hours/day to maximize safety/ind with ADL/functional mobility.      If plan is discharge home, recommend the following:   A little help with walking and/or transfers;A lot of help with bathing/dressing/bathroom;Assistance with cooking/housework;Assist for transportation;Help with stairs or ramp for entrance     Functional Status Assessment   Patient has had a recent decline in their functional status and demonstrates the ability to make significant improvements in function in a reasonable and predictable amount of time.     Equipment Recommendations   Other (comment) (defer)     Recommendations for Other Services   PT consult     Precautions/Restrictions   Precautions Precautions: Fall Precaution/Restrictions Comments: watch O2 Restrictions Weight Bearing Restrictions Per Provider Order: No     Mobility Bed Mobility Overal bed mobility: Needs Assistance Bed Mobility: Supine to Sit     Supine  to sit: Contact guard          Transfers Overall transfer level: Needs assistance Equipment used: Rolling walker (2 wheels) Transfers: Sit to/from Stand Sit to Stand: Min assist           General transfer comment: pt reports having ?postop shoe, not seen in notes, requested pt have family member bring      Balance Overall balance assessment: Needs assistance Sitting-balance support: Single extremity supported, Feet supported Sitting balance-Leahy Scale: Fair     Standing balance support: Bilateral upper extremity supported, During functional activity, Reliant on assistive device for balance Standing balance-Leahy Scale: Poor Standing balance comment: Min A for balance                           ADL either performed or assessed with clinical judgement   ADL Overall ADL's : Needs assistance/impaired Eating/Feeding: Set up   Grooming: Set up   Upper Body Bathing: Contact guard assist   Lower Body Bathing: Moderate assistance   Upper Body Dressing : Minimal assistance   Lower Body Dressing: Moderate assistance   Toilet Transfer: Minimal assistance;Ambulation   Toileting- Clothing Manipulation and Hygiene: Minimal assistance       Functional mobility during ADLs: Minimal assistance;Rolling walker (2 wheels)       Vision   Vision Assessment?: No apparent visual deficits     Perception Perception: Not tested       Praxis Praxis: Not tested       Pertinent Vitals/Pain Pain Assessment Pain Assessment: Faces Pain Score: 10-Worst pain ever Faces  Pain Scale: Hurts worst Pain Location: L knee/hip/shin/groin Pain Descriptors / Indicators: Aching, Burning Pain Intervention(s): Limited activity within patient's tolerance, Monitored during session, Repositioned     Extremity/Trunk Assessment Upper Extremity Assessment Upper Extremity Assessment: Generalized weakness   Lower Extremity Assessment Lower Extremity Assessment: Defer to PT  evaluation   Cervical / Trunk Assessment Cervical / Trunk Assessment: Kyphotic   Communication Communication Communication: Other (comment) (mild wordfinding difficulties)   Cognition Arousal: Alert Behavior During Therapy: WFL for tasks assessed/performed Cognition: Cognition impaired       Memory impairment (select all impairments): Short-term memory                       Following commands: Intact       Cueing  General Comments   Cueing Techniques: Verbal cues;Tactile cues  VSS on 3L O2   Exercises     Shoulder Instructions      Home Living Family/patient expects to be discharged to:: Private residence Living Arrangements: Spouse/significant other;Children Available Help at Discharge: Family;Available 24 hours/day Type of Home: House Home Access: Ramped entrance     Home Layout: Two level;Able to live on main level with bedroom/bathroom     Bathroom Shower/Tub: Walk-in shower;Door   Bathroom Toilet: Handicapped height Bathroom Accessibility: Yes   Home Equipment: Grab bars - tub/shower;Shower Counsellor (2 wheels);Cane - single point;Rollator (4 wheels);Hand held shower head;Transport chair   Additional Comments: 2L O2 baseline      Prior Functioning/Environment Prior Level of Function : Independent/Modified Independent;Driving             Mobility Comments: RW ADLs Comments: family was assisting with ADLs; assist with IADLs also    OT Problem List: Decreased strength;Decreased range of motion;Decreased activity tolerance;Impaired balance (sitting and/or standing);Decreased cognition;Decreased safety awareness   OT Treatment/Interventions: Self-care/ADL training;Therapeutic exercise;Energy conservation;DME and/or AE instruction;Therapeutic activities;Patient/family education;Balance training      OT Goals(Current goals can be found in the care plan section)   Acute Rehab OT Goals Patient Stated Goal: none stated OT Goal  Formulation: With patient Time For Goal Achievement: 01/06/24 Potential to Achieve Goals: Good ADL Goals Pt Will Perform Upper Body Dressing: with supervision;sitting Pt Will Perform Lower Body Dressing: sit to/from stand;sitting/lateral leans;with contact guard assist Pt Will Transfer to Toilet: with contact guard assist;ambulating;regular height toilet Pt Will Perform Tub/Shower Transfer: Tub transfer;Shower transfer;with contact guard assist;ambulating   OT Frequency:  Min 2X/week    Co-evaluation              AM-PAC OT 6 Clicks Daily Activity     Outcome Measure Help from another person eating meals?: A Little Help from another person taking care of personal grooming?: A Little Help from another person toileting, which includes using toliet, bedpan, or urinal?: A Little Help from another person bathing (including washing, rinsing, drying)?: A Lot Help from another person to put on and taking off regular upper body clothing?: A Lot Help from another person to put on and taking off regular lower body clothing?: A Lot 6 Click Score: 15   End of Session Equipment Utilized During Treatment: Rolling walker (2 wheels);Gait belt Nurse Communication: Mobility status  Activity Tolerance: Patient tolerated treatment well Patient left: in chair;with call bell/phone within reach;with chair alarm set;with nursing/sitter in room  OT Visit Diagnosis: Unsteadiness on feet (R26.81);Other abnormalities of gait and mobility (R26.89);Muscle weakness (generalized) (M62.81)  Time: 9095-9068 OT Time Calculation (min): 27 min Charges:  OT General Charges $OT Visit: 1 Visit OT Evaluation $OT Eval Moderate Complexity: 1 Mod OT Treatments $Self Care/Home Management : 8-22 mins  Riggin Cuttino K, OTD, OTR/L SecureChat Preferred Acute Rehab [phone]   Kerry Powell 12/23/2023, 10:22 AM

## 2023-12-23 NOTE — Progress Notes (Signed)
 Progress Note   Patient: Kerry Powell FMW:[id number] DOB: 31-Oct-1945 DOA: 12/20/2023     3 DOS: the patient was seen and examined on 12/23/2023   Brief hospital course: Kerry Powell is a 78 y.o. 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  Patient is admitted to TRH service for further management evaluation of respiratory failure, positive D-dimer, left lower extremity pain status post fall, AKI  Assessment and Plan: Acute on chronic hypoxic respiratory failure- Patient presented with worsening shortness of breath, tachycardia, increased oxygen requirement.  This is possibly due to underlying COPD, hypoventilation, atelectasis. D-dimer is elevated, VQ scan rule out PE pending. Venous Doppler negative for lower extremity DVT. Continue COPD inhalers and nebulizers as needed. PT OT advised SNF. PASSR, FL2 signed. Continue home oxygen to maintain saturation greater than 92%.  Left lower extremity pain Status post ground-level fall X-ray femur and left ankle imaging unremarkable for any fracture. Lumbar spine xray shows DJD, no acute findings. Continue Tylenol , Robaxin  and tramadol  for pain control.  Possible UTI- UA abnormal, has confusion, lower abdomen pain. Urine cultures ordered. Rocephin  started.  Acute on chronic kidney disease stage IIIb- Patient's baseline creatinine around 1.5-1.7. Patient presented with creatinine of 2.18. Creatinine today 2.05. Lasix  held. Avoid nephrotoxic drugs. Continue to monitor daily renal function, electrolytes.  Hypokalemia-oral potassium supplementation ordered.  Neurocognitive disorder- Patient does report confusion, hallucinations.  Seems to have dementia. Continue supportive care, fall, aspiration  precautions Delirium precautions. Patient will need outpatient neurology and geropsych evaluation.  COPD Chronic respiratory failure with hypoxia on 2 L supplemental oxygen. Continue bronchodilators, home inhalers.  HFpEF-no acute exacerbation.  Lasix  held due to renal dysfunction.  CAD/PAD-continue aspirin, Plavix , statin. Hypertension-hold telmisartan , resumed metoprolol . Hyperlipidemia-continue statin therapy.  Type 2 diabetes mellitus-continue Accu-Cheks, sliding scale insulin .     PT OT evaluation for discharge planning. Out of bed to chair. Incentive spirometry. Nursing supportive care. Fall, aspiration precautions. Diet:  Diet Orders (From admission, onward)     Start     Ordered   12/21/23 0138  Diet regular Room service appropriate? Yes; Fluid consistency: Thin  Diet effective now       Question Answer Comment  Room service appropriate? Yes   Fluid consistency: Thin      12/21/23 0138           DVT prophylaxis: heparin  injection 5,000 Units Start: 12/21/23 0600  Level of care: Telemetry Medical   Code Status: Limited: Do not attempt resuscitation (DNR) -DNR-LIMITED -Do Not Intubate/DNI   Subjective: Patient is seen and examined today morning.  She is sleeping, eating poor. Did not work with PT. Has left lower extremity pain  Physical Exam: Vitals:   12/23/23 0832 12/23/23 0833 12/23/23 0929 12/23/23 1104  BP:   114/72 118/74  Pulse:   (!) 106   Resp:      Temp:    97.8 F (36.6 C)  TempSrc:    Oral  SpO2: 96% 97%    Weight:      Height:        General - Elderly obese Caucasian female, no distress HEENT - PERRLA, EOMI, atraumatic head, non tender sinuses. Lung - Clear, basal rales, no rhonchi, wheezes. Heart - S1, S2 heard,  no murmurs, rubs, 1+ pedal edema. Abdomen - Soft, non tender, bowel sounds good Neuro - sleepy, arousable, non focal exam. Skin - Warm and dry.  Data Reviewed:      Latest Ref Rng & Units 12/21/2023    4:30 AM  12/20/2023    7:57 PM 11/22/2023    3:25 PM  CBC  WBC 4.0 - 10.5 K/uL 9.3  8.4  7.0   Hemoglobin 12.0 - 15.0 g/dL 86.9  85.7  87.0   Hematocrit 36.0 - 46.0 % 41.4  46.1  41.3   Platelets 150 - 400 K/uL 233  279  323.0       Latest Ref Rng & Units 12/23/2023   11:03 AM 12/22/2023    4:14 AM 12/21/2023    4:30 AM  BMP  Glucose 70 - 99 mg/dL 887  90  92   BUN 8 - 23 mg/dL 11  11  13    Creatinine 0.44 - 1.00 mg/dL 7.94  8.22  8.05   Sodium 135 - 145 mmol/L 138  138  135   Potassium 3.5 - 5.1 mmol/L 3.7  3.6  3.7   Chloride 98 - 111 mmol/L 97  104  101   CO2 22 - 32 mmol/L 27  23  21    Calcium  8.9 - 10.3 mg/dL 9.7  9.6  9.3    VAS US  LOWER EXTREMITY VENOUS (DVT) Result Date: 12/23/2023  Lower Venous DVT Study Patient Name:  Kerry Powell  Date of Exam:   12/22/2023 Medical Rec #: [id number]       Accession #:    [id number] Date of Birth: 1945-07-23       Patient Gender: F Patient Age:   26 years Exam Location:  [facility] Procedure:      VAS US  LOWER EXTREMITY VENOUS (DVT) Referring Phys: DORN DAWSON --------------------------------------------------------------------------------  Indications: Edema.  Risk Factors: None identified. Limitations: Poor ultrasound/tissue interface and patient positioning. Comparison Study: No prior studies. Performing Technologist: Cordella Collet RVT  Examination Guidelines: A complete evaluation includes B-mode imaging, spectral Doppler, color Doppler, and power Doppler as needed of all accessible portions of each vessel. Bilateral testing is considered an integral part of a complete examination. Limited examinations for reoccurring indications may be performed as noted. The reflux portion of the exam is performed with the patient in reverse Trendelenburg.  +---------+---------------+---------+-----------+----------+--------------+ RIGHT    CompressibilityPhasicitySpontaneityPropertiesThrombus Aging  +---------+---------------+---------+-----------+----------+--------------+ CFV      Full           Yes      Yes                                 +---------+---------------+---------+-----------+----------+--------------+ SFJ      Full                                                        +---------+---------------+---------+-----------+----------+--------------+ FV Prox  Full                                                        +---------+---------------+---------+-----------+----------+--------------+ FV Mid   Full                                                        +---------+---------------+---------+-----------+----------+--------------+  FV DistalFull                                                        +---------+---------------+---------+-----------+----------+--------------+ PFV      Full                                                        +---------+---------------+---------+-----------+----------+--------------+ POP      Full           Yes      Yes                                 +---------+---------------+---------+-----------+----------+--------------+ PTV      Full                                                        +---------+---------------+---------+-----------+----------+--------------+ PERO     Full                                                        +---------+---------------+---------+-----------+----------+--------------+   +---------+---------------+---------+-----------+----------+-------------------+ LEFT     CompressibilityPhasicitySpontaneityPropertiesThrombus Aging      +---------+---------------+---------+-----------+----------+-------------------+ CFV      Full           Yes      Yes                                      +---------+---------------+---------+-----------+----------+-------------------+ SFJ      Full                                                              +---------+---------------+---------+-----------+----------+-------------------+ FV Prox  Full                                                             +---------+---------------+---------+-----------+----------+-------------------+ FV Mid   Full                                                             +---------+---------------+---------+-----------+----------+-------------------+ FV DistalFull                                                             +---------+---------------+---------+-----------+----------+-------------------+  PFV      Full                                                             +---------+---------------+---------+-----------+----------+-------------------+ POP      Full           Yes      Yes                                      +---------+---------------+---------+-----------+----------+-------------------+ PTV      Full                                                             +---------+---------------+---------+-----------+----------+-------------------+ PERO                                                  Not well visualized +---------+---------------+---------+-----------+----------+-------------------+     Summary: RIGHT: - There is no evidence of deep vein thrombosis in the lower extremity.  - No cystic structure found in the popliteal fossa.  LEFT: - There is no evidence of deep vein thrombosis in the lower extremity. However, portions of this examination were limited- see technologist comments above.  - No cystic structure found in the popliteal fossa.  *See table(s) above for measurements and observations. Electronically signed by Gaile New MD on 12/23/2023 at 10:25:41 AM.    Final    DG Lumbar Spine 1 View Result Date: 12/22/2023 CLINICAL DATA:  Low back pain. EXAM: LUMBAR SPINE - 1 VIEW COMPARISON:  None Available. FINDINGS: There is no evidence of acute lumbar spine fracture. There is mild anterolisthesis at  L4-L5. Intervertebral disc space narrowing is noted at multiple levels and most pronounced at L5-S1. Facet arthropathy is present throughout the lumbar spine. There is atherosclerotic calcification aorta. IMPRESSION: 1. No acute fracture. 2. Multilevel degenerative changes in the lumbar spine. 3. Aortic atherosclerosis. Electronically Signed   By: Leita Birmingham M.D.   On: 12/22/2023 19:12    Family Communication: No family at bedside Disposition: Status is: Inpatient Remains inpatient appropriate because: Confusion, VQ scan, UTI  Planned Discharge Destination: Home with Home Health     Time spent: 44 minutes  Author: Concepcion Riser, MD 12/23/2023 3:38 PM Secure chat 7am to 7pm For on call review [email or web address].

## 2023-12-23 NOTE — TOC Initial Note (Addendum)
 Transition of Care [facility]) - Initial/Assessment Note    Patient Details  Name: Kerry Powell MRN: [id number] Date of Birth: [birth year]  Transition of Care [facility]) CM/SW Contact:    Lauraine FORBES Saa, LCSWA Phone Number: 12/23/2023, 11:42 AM  Clinical Narrative:                  11:42 AM CSW introduced self and role to patient. CSW informed patient of therapy's recommendation of patient discharging to SNF. Patinet was agreeable with recommendation and requested CSW to only send referral to [facility]. CSW sent fl2 to [facility]. Patient confirmed that she resides at home with spouse and son. Patient informed CSW that her son and daughter provide transportation for appointments. Patient confirmed DME (oxygen, RW) history (per chart, DME form [employer]). Patient expressed interest in wheelchair and rollator. CSW informed patient that SNF would follow up with patient on home DME. Patient expressed understanding of the information. Patinet informed CSW that she is currently active with [facility]. Per chart review, patient has HH history with [facility]. Patient has a PCP and insurance. Patient's preferred pharmacy us  [facility] [address]. CSW will continue to follow.  11:56 AM Patient's PASRR is currently pending additional clinicals to be submitted. CSW will continue to follow.  Expected Discharge Plan: Skilled Nursing Facility Barriers to Discharge: Continued Medical Work up, English As A Second Language Teacher, SNF Pending bed offer   Patient Goals and CMS Choice Patient states their goals for this hospitalization and ongoing recovery are:: SNF          Expected Discharge Plan and Services In-house Referral: Clinical Social Work   Post Acute Care Choice: Skilled Nursing Facility Living arrangements for the past 2 months: Single Family Home                                      Prior Living Arrangements/Services Living arrangements for the past 2 months: Single Family  Home Lives with:: Spouse, Adult Children Patient language and need for interpreter reviewed:: Yes        Need for Family Participation in Patient Care: No (Comment) Care giver support system in place?: Yes (comment) Current home services: DME, Home RN, Home OT, Home PT Criminal Activity/Legal Involvement Pertinent to Current Situation/Hospitalization: No - Comment as needed  Activities of Daily Living      Permission Sought/Granted Permission sought to share information with : Family Supports, Oceanographer granted to share information with : No (Contact information on chart)  Share Information with NAME: Mark Benecke  Permission granted to share info w AGENCY: SNF  Permission granted to share info w Relationship: [phone]  Permission granted to share info w Contact Information: Son  Emotional Assessment Appearance:: Appears stated age Attitude/Demeanor/Rapport: Engaged Affect (typically observed): Accepting, Appropriate, Adaptable, Calm, Stable, Pleasant Orientation: : Oriented to Self, Oriented to Place, Oriented to Situation, Oriented to  Time Alcohol  / Substance Use: Not Applicable Psych Involvement: No (comment)  Admission diagnosis:  AKI (acute kidney injury) [N17.9] D-dimer, elevated [R79.89] Acute hypoxic respiratory failure (HCC) [J96.01] Patient Active Problem List   Diagnosis Date Noted   Acute hypoxic respiratory failure (HCC) 12/20/2023   History of femur fracture 12/09/2023   Hypotension 11/22/2023   Protein-calorie malnutrition, severe 10/31/2023   Fall at home, initial encounter 10/21/2023   Closed comminuted intertrochanteric fracture of proximal end of left femur (HCC) 10/21/2023   Chronic respiratory  failure with hypoxia (HCC) 10/16/2023   Severe sepsis (HCC) 10/07/2023   Sterile pyuria 10/07/2023   Hematoma of left lower leg 08/26/2023   Hoarseness, chronic 06/05/2023   Hypoxia 03/06/2023   Fall 02/11/2023    Leukocytosis 02/11/2023   History of COPD 02/11/2023   CKD (chronic kidney disease) stage 4, GFR 15-29 ml/min (HCC) 02/11/2023   Chronic diastolic CHF (congestive heart failure) (HCC) 02/11/2023   Multiple fractures of ribs, bilateral, initial encounter for closed fracture 02/11/2023   Rib fractures 02/10/2023   PAD (peripheral artery disease) 05/15/2022   Pre-syncope 02/01/2022   Peroneal tendinitis 12/04/2021   Pain in joint of left shoulder 11/16/2021   Facial droop 07/26/2021   Osteoarthritis of knees, bilateral 05/04/2021   Pain in joint of right knee 05/04/2021   Osteoarthritis of left knee 05/03/2021   B12 deficiency 08/03/2020   Diabetic polyneuropathy associated with type 2 diabetes mellitus (HCC) 07/29/2020   Diastolic dysfunction without heart failure 01/20/2020   Pain of left thumb 12/24/2019   Pain of right thumb 12/24/2019   Polyarthralgia 12/07/2019   Myalgia 12/07/2019   Anemia 12/07/2019   Fatigue 12/07/2019   Acquired thrombophilia 07/14/2019   CAD (coronary artery disease), native coronary artery    Cervical spine pain 06/16/2019   Pleural plaque 04/27/2019   Spasm of thoracic back muscle 11/14/2018   SOB (shortness of breath) 04/17/2018   Vitamin D  deficiency 03/12/2018   Thoracic aortic aneurysm 02/06/2018   Pain in left knee 01/14/2018   Sleep difficulties 09/09/2017   Depression 07/01/2017   Dizziness 06/10/2017   H/O renal cell carcinoma 05/23/2017   History of kidney cancer 05/23/2017   Cervical radiculopathy 10/31/2016   Hyperlipidemia associated with type 2 diabetes mellitus (HCC) 06/25/2016   Occipital neuralgia 05/09/2016   Anxiety 07/26/2015   Chronic neck pain 04/30/2015   DM2 (diabetes mellitus, type 2) (HCC) 04/27/2015   History of CVA (cerebrovascular accident) 04/26/2015   Gout 04/26/2015   Hypertension associated with type 2 diabetes mellitus (HCC) 04/26/2015   Snoring 04/26/2015   Bilateral leg edema 04/26/2015   Dysphagia  04/26/2015   GERD (gastroesophageal reflux disease) 04/26/2015   Localized edema 03/20/2015   Tobacco dependence syndrome 12/17/2007   History of colonic polyps 05/29/2007   HIATAL HERNIA 04/07/2007   RESTLESS LEG SYNDROME 04/04/2007   RHINOSINUSITIS, 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   COPD (chronic obstructive pulmonary disease) (HCC) 04/04/2007   IRRITABLE BOWEL SYNDROME 04/04/2007   Osteoarthritis, diffuse 04/04/2007   PCP:  Geofm Glade PARAS, MD Pharmacy:   [facility] [id number]), [place] - [address] [place] ([place]) [name] [address] Phone: [phone] Fax: [phone]     Social Drivers of Health (SDOH) Social History: SDOH Screenings   Food Insecurity: No Food Insecurity (12/21/2023)  Housing: Low Risk  (12/21/2023)  Transportation Needs: No Transportation Needs (12/21/2023)  Utilities: Not At Risk (12/21/2023)  Alcohol  Screen: Low Risk  (05/15/2023)  Depression (PHQ2-9): Low Risk  (11/20/2023)  Financial Resource Strain: Low Risk  (12/08/2023)  Physical Activity: Inactive (12/08/2023)  Social Connections: Socially Isolated (12/21/2023)  Stress: Stress Concern Present (12/08/2023)  Tobacco Use: Medium Risk (12/20/2023)  Health Literacy: Adequate Health Literacy (05/15/2023)   SDOH Interventions:     Readmission Risk Interventions     No data to display

## 2023-12-23 NOTE — NC FL2 (Signed)
 [place]  MEDICAID FL2 LEVEL OF CARE FORM     IDENTIFICATION  Patient Name: Kerry Powell Birthdate: 1945/08/21 Sex: female Admission Date (Current Location): 12/20/2023  [facility] and [place] Number:  Producer, Television/film/video and Address:  [facility], [address], [place], [name] [address]      Provider Number: [id number]  Attending Physician Name and Address:  Darci Pore, MD  Relative Name and Phone Number:  Deshaun Schou; Son; [phone] and Luwana Butrick; Daughter; [phone]    Current Level of Care: Hospital Recommended Level of Care: Skilled Nursing Facility Prior Approval Number:    Date Approved/Denied:   PASRR Number:    Discharge Plan: SNF    Current Diagnoses: Patient Active Problem List   Diagnosis Date Noted   Acute hypoxic respiratory failure (HCC) 12/20/2023   History of femur fracture 12/09/2023   Hypotension 11/22/2023   Protein-calorie malnutrition, severe 10/31/2023   Fall at home, initial encounter 10/21/2023   Closed comminuted intertrochanteric fracture of proximal end of left femur (HCC) 10/21/2023   Chronic respiratory failure with hypoxia (HCC) 10/16/2023   Severe sepsis (HCC) 10/07/2023   Sterile pyuria 10/07/2023   Hematoma of left lower leg 08/26/2023   Hoarseness, chronic 06/05/2023   Hypoxia 03/06/2023   Fall 02/11/2023   Leukocytosis 02/11/2023   History of COPD 02/11/2023   CKD (chronic kidney disease) stage 4, GFR 15-29 ml/min (HCC) 02/11/2023   Chronic diastolic CHF (congestive heart failure) (HCC) 02/11/2023   Multiple fractures of ribs, bilateral, initial encounter for closed fracture 02/11/2023   Rib fractures 02/10/2023   PAD (peripheral artery disease) 05/15/2022   Pre-syncope 02/01/2022   Peroneal tendinitis 12/04/2021   Pain in joint of left shoulder 11/16/2021   Facial droop 07/26/2021   Osteoarthritis of knees, bilateral 05/04/2021   Pain in joint of right knee 05/04/2021    Osteoarthritis of left knee 05/03/2021   B12 deficiency 08/03/2020   Diabetic polyneuropathy associated with type 2 diabetes mellitus (HCC) 07/29/2020   Diastolic dysfunction without heart failure 01/20/2020   Pain of left thumb 12/24/2019   Pain of right thumb 12/24/2019   Polyarthralgia 12/07/2019   Myalgia 12/07/2019   Anemia 12/07/2019   Fatigue 12/07/2019   Acquired thrombophilia 07/14/2019   CAD (coronary artery disease), native coronary artery    Cervical spine pain 06/16/2019   Pleural plaque 04/27/2019   Spasm of thoracic back muscle 11/14/2018   SOB (shortness of breath) 04/17/2018   Vitamin D  deficiency 03/12/2018   Thoracic aortic aneurysm 02/06/2018   Pain in left knee 01/14/2018   Sleep difficulties 09/09/2017   Depression 07/01/2017   Dizziness 06/10/2017   H/O renal cell carcinoma 05/23/2017   History of kidney cancer 05/23/2017   Cervical radiculopathy 10/31/2016   Hyperlipidemia associated with type 2 diabetes mellitus (HCC) 06/25/2016   Occipital neuralgia 05/09/2016   Anxiety 07/26/2015   Chronic neck pain 04/30/2015   DM2 (diabetes mellitus, type 2) (HCC) 04/27/2015   History of CVA (cerebrovascular accident) 04/26/2015   Gout 04/26/2015   Hypertension associated with type 2 diabetes mellitus (HCC) 04/26/2015   Snoring 04/26/2015   Bilateral leg edema 04/26/2015   Dysphagia 04/26/2015   GERD (gastroesophageal reflux disease) 04/26/2015   Localized edema 03/20/2015   Tobacco dependence syndrome 12/17/2007   History of colonic polyps 05/29/2007   HIATAL HERNIA 04/07/2007   RESTLESS LEG SYNDROME 04/04/2007   RHINOSINUSITIS, 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   COPD (chronic obstructive pulmonary disease) (HCC) 04/04/2007   IRRITABLE  BOWEL SYNDROME 04/04/2007   Osteoarthritis, diffuse 04/04/2007    Orientation RESPIRATION BLADDER Height & Weight     Self, Time, Situation, Place  O2 (3L nasal cannula) Continent Weight: 179 lb 7.3 oz (81.4 kg) Height:  5' 5 (165.1  cm)  BEHAVIORAL SYMPTOMS/MOOD NEUROLOGICAL BOWEL NUTRITION STATUS      Continent Diet (Please see discharge summary)  AMBULATORY STATUS COMMUNICATION OF NEEDS Skin   Limited Assist Verbally Normal                       Personal Care Assistance Level of Assistance  Feeding, Bathing, Dressing Bathing Assistance: Limited assistance Feeding assistance: Limited assistance Dressing Assistance: Limited assistance     Functional Limitations Info             SPECIAL CARE FACTORS FREQUENCY  PT (By licensed PT), OT (By licensed OT)     PT Frequency: 5x OT Frequency: 5x            Contractures Contractures Info: Not present    Additional Factors Info  Code Status, Allergies, Insulin  Sliding Scale Code Status Info: DNR-LIMITED -Do Not Intubate/DNI Allergies Info: Gabapentin; Clindamycin ; Sulfa Antibiotics   Insulin  Sliding Scale Info: Please see discharge summary       Current Medications (12/23/2023):  This is the current hospital active medication list Current Facility-Administered Medications  Medication Dose Route Frequency Provider Last Rate Last Admin   acetaminophen  (TYLENOL ) tablet 1,000 mg  1,000 mg Oral Q6H PRN Keturah Carrier, MD   1,000 mg at 12/22/23 2207   albuterol  (PROVENTIL ) (2.5 MG/3ML) 0.083% nebulizer solution 2.5 mg  2.5 mg Nebulization Q4H PRN Segars, Jonathan, MD   2.5 mg at 12/22/23 2225   allopurinol  (ZYLOPRIM ) tablet 300 mg  300 mg Oral Daily Segars, Carrier, MD   300 mg at 12/23/23 9074   aspirin EC tablet 81 mg  81 mg Oral Daily Segars, Carrier, MD   81 mg at 12/23/23 9074   cefTRIAXone  (ROCEPHIN ) 2 g in sodium chloride  0.9 % 100 mL IVPB  2 g Intravenous Q24H Darci Pore, MD 200 mL/hr at 12/23/23 0936 2 g at 12/23/23 0936   clopidogrel  (PLAVIX ) tablet 75 mg  75 mg Oral Daily Segars, Jonathan, MD   75 mg at 12/23/23 9074   DULoxetine  (CYMBALTA ) DR capsule 60 mg  60 mg Oral Daily Segars, Jonathan, MD   60 mg at 12/23/23 9074    fluticasone  furoate-vilanterol (BREO ELLIPTA ) 100-25 MCG/ACT 1 puff  1 puff Inhalation Daily Segars, Carrier, MD   1 puff at 12/23/23 9167   furosemide  (LASIX ) tablet 20 mg  20 mg Oral Daily Sreeram, Narendranath, MD   20 mg at 12/23/23 9074   heparin  injection 5,000 Units  5,000 Units Subcutaneous Q8H Segars, Jonathan, MD   5,000 Units at 12/23/23 9347   insulin  aspart (novoLOG ) injection 0-6 Units  0-6 Units Subcutaneous TID WC Segars, Carrier, MD       ipratropium-albuterol  (DUONEB) 0.5-2.5 (3) MG/3ML nebulizer solution 3 mL  3 mL Nebulization TID Darci Pore, MD   3 mL at 12/23/23 0830   melatonin tablet 6 mg  6 mg Oral QHS PRN Segars, Jonathan, MD   6 mg at 12/22/23 2207   methocarbamol  (ROBAXIN ) tablet 500 mg  500 mg Oral Q8H PRN Segars, Jonathan, MD   500 mg at 12/22/23 2207   metoprolol  succinate (TOPROL -XL) 24 hr tablet 12.5 mg  12.5 mg Oral Daily Segars, Jonathan, MD   12.5 mg at  12/23/23 0929   ondansetron  (ZOFRAN ) injection 4 mg  4 mg Intravenous Q6H PRN Keturah Carrier, MD   4 mg at 12/22/23 1326   oxyCODONE  (Oxy IR/ROXICODONE ) immediate release tablet 5 mg  5 mg Oral Q4H PRN Sreeram, Narendranath, MD   5 mg at 12/23/23 0926   polyethylene glycol (MIRALAX  / GLYCOLAX ) packet 17 g  17 g Oral Daily PRN Segars, Jonathan, MD       potassium chloride  SA (KLOR-CON  M) CR tablet 20 mEq  20 mEq Oral Daily Sreeram, Narendranath, MD   20 mEq at 12/23/23 9072   rosuvastatin  (CRESTOR ) tablet 10 mg  10 mg Oral Daily Segars, Carrier, MD   10 mg at 12/23/23 0925   sodium chloride  flush (NS) 0.9 % injection 3 mL  3 mL Intravenous Q12H Segars, Jonathan, MD   3 mL at 12/23/23 9073   traMADol  (ULTRAM ) tablet 50 mg  50 mg Oral Q12H PRN Rathore, Vasundhra, MD   50 mg at 12/23/23 0137   umeclidinium bromide  (INCRUSE ELLIPTA ) 62.5 MCG/ACT 1 puff  1 puff Inhalation Daily Segars, Jonathan, MD   1 puff at 12/23/23 9167     Discharge Medications: Please see discharge summary for a list of discharge  medications.  Relevant Imaging Results:  Relevant Lab Results:   Additional Information SSN [id number]  Lauraine FORBES Saa, LCSWA

## 2023-12-24 ENCOUNTER — Telehealth: Payer: Self-pay

## 2023-12-24 DIAGNOSIS — R419 Unspecified symptoms and signs involving cognitive functions and awareness: Secondary | ICD-10-CM | POA: Diagnosis not present

## 2023-12-24 DIAGNOSIS — J9621 Acute and chronic respiratory failure with hypoxia: Secondary | ICD-10-CM | POA: Diagnosis not present

## 2023-12-24 DIAGNOSIS — N179 Acute kidney failure, unspecified: Secondary | ICD-10-CM | POA: Diagnosis not present

## 2023-12-24 DIAGNOSIS — M79605 Pain in left leg: Secondary | ICD-10-CM | POA: Diagnosis not present

## 2023-12-24 LAB — BASIC METABOLIC PANEL WITH GFR
Anion gap: 9 (ref 5–15)
BUN: 13 mg/dL (ref 8–23)
CO2: 27 mmol/L (ref 22–32)
Calcium: 9.3 mg/dL (ref 8.9–10.3)
Chloride: 97 mmol/L — ABNORMAL LOW (ref 98–111)
Creatinine, Ser: 2.09 mg/dL — ABNORMAL HIGH (ref 0.44–1.00)
GFR, Estimated: 24 mL/min — ABNORMAL LOW (ref >60)
Glucose, Bld: 96 mg/dL (ref 70–99)
Potassium: 3.7 mmol/L (ref 3.5–5.1)
Sodium: 133 mmol/L — ABNORMAL LOW (ref 135–145)

## 2023-12-24 LAB — URINE CULTURE: Culture: NO GROWTH

## 2023-12-24 LAB — GLUCOSE, CAPILLARY
Glucose-Capillary: 112 mg/dL — ABNORMAL HIGH (ref 70–99)
Glucose-Capillary: 99 mg/dL (ref 70–99)
Glucose-Capillary: 109 mg/dL — ABNORMAL HIGH (ref 70–99)
Glucose-Capillary: 75 mg/dL (ref 70–99)

## 2023-12-24 NOTE — Care Management Important Message (Signed)
 Important Message  Patient Details  Name: Kerry Powell MRN: [id number] Date of Birth: May 03, 1945   Important Message Given:  Yes - Medicare IM     Claretta Deed 12/24/2023, 11:07 AM

## 2023-12-24 NOTE — TOC Progression Note (Signed)
 Transition of Care [facility]) - Progression Note    Patient Details  Name: Kerry Powell MRN: [id number] Date of Birth: [birth year]  Transition [facility] Care [facility]) CM/SW Contact  Lauraine FORBES Saa, LCSWA Phone Number: 12/24/2023, 4:20 PM  Clinical Narrative:     4:21 PM CSW submitted additional clinicals requested for PASRR. Patient's PASRR is currently pending review. Per chart review, patient is not yet medically stable for discharge due to UTI and confusion. CSW will continue to follow.  Expected Discharge Plan: Skilled Nursing Facility Barriers to Discharge: Continued Medical Work up, English As A Second Language Teacher, SNF Pending bed offer               Expected Discharge Plan and Services In-house Referral: Clinical Social Work   Post Acute Care Choice: Skilled Nursing Facility Living arrangements for the past 2 months: Single Family Home                                       Social Drivers of Health (SDOH) Interventions SDOH Screenings   Food Insecurity: No Food Insecurity (12/21/2023)  Housing: Low Risk  (12/21/2023)  Transportation Needs: No Transportation Needs (12/21/2023)  Utilities: Not At Risk (12/21/2023)  Alcohol  Screen: Low Risk  (05/15/2023)  Depression (PHQ2-9): Low Risk  (11/20/2023)  Financial Resource Strain: Low Risk  (12/08/2023)  Physical Activity: Inactive (12/08/2023)  Social Connections: Unknown (12/24/2023)  Recent Concern: Social Connections - Socially Isolated (12/21/2023)  Stress: Stress Concern Present (12/08/2023)  Tobacco Use: Medium Risk (12/23/2023)  Health Literacy: Adequate Health Literacy (05/15/2023)    Readmission Risk Interventions     No data to display

## 2023-12-24 NOTE — Progress Notes (Signed)
 Mobility Specialist Progress Note;   12/24/23 1548  Mobility  Activity Pivoted/transferred from chair to bed  Level of Assistance Contact guard assist, steadying assist  Assistive Device Front wheel walker  Distance Ambulated (ft) 5 ft  Activity Response Tolerated well  Mobility Referral Yes  Mobility visit 1 Mobility  Mobility Specialist Start Time (ACUTE ONLY) 1548  Mobility Specialist Stop Time (ACUTE ONLY) 1558  Mobility Specialist Time Calculation (min) (ACUTE ONLY) 10 min   Pt requesting asisstance back to bed. Required MinG assistance to safely transfer back to bed. VSS throughout. C/o muscles spasms throughout once in bed. Otherwise no c/o. Pt left with all needs met, alarm on.   Lauraine Erm Mobility Specialist Please contact via SecureChat or [employer] [phone]

## 2023-12-24 NOTE — Progress Notes (Signed)
 Physical Therapy Treatment Patient Details Name: Kerry Powell MRN: [id number] DOB: 07/14/1945 Today's Date: 12/24/2023   History of Present Illness Pt is 78 yo presenting to [facility] on 10/24 due to AMS and increased shortness of breathe with exertion and increased O2 demands. PMH includes Recent IMN of the L femur on 8/28, DMII, CKD, HFpEF, COPD.    PT Comments  Making gradual progress towards acute functional goals. Improving awareness but still a bit tangential with conversation, especially regarding her recent PMH and order of events regarding LLE injuries. She was a able to transfer with min assist from recliner and ambulate up to 42 feet with moderate reliance on RW for support. No buckling this date but has moderate antalgic patterning with reduced WB through LLE. Patient will continue to benefit from skilled physical therapy services to further improve independence with functional mobility.     If plan is discharge home, recommend the following: Assist for transportation;Help with stairs or ramp for entrance;Assistance with cooking/housework   Can travel by private vehicle     Yes  Equipment Recommendations  None recommended by PT    Recommendations for Other Services       Precautions / Restrictions Precautions Precautions: Fall Recall of Precautions/Restrictions: Impaired Precaution/Restrictions Comments: watch O2 Restrictions Weight Bearing Restrictions Per Provider Order: No     Mobility  Bed Mobility               General bed mobility comments: in recliner    Transfers Overall transfer level: Needs assistance Equipment used: Rolling walker (2 wheels) Transfers: Sit to/from Stand Sit to Stand: Min assist           General transfer comment: Min assist for boost to stand, requires cues for hand placement to rise. Good recall to safely sit. Performed from recliner    Ambulation/Gait Ambulation/Gait assistance: Contact guard assist Gait Distance (Feet):  42 Feet Assistive device: Rolling walker (2 wheels) Gait Pattern/deviations: Step-through pattern, Decreased stance time - left, Antalgic, Decreased stride length, Trendelenburg Gait velocity: decreased Gait velocity interpretation: <1.31 ft/sec, indicative of household ambulator   General Gait Details: No overt buckling noted during visit today. Still moderately antalgic pattern with decreased stance time on Lt, wearing post-op shoe. cues for RW placement and to avoid getting to close to from of RW. SpO2 91-2% on 4L. HR to 117. Required a couple of standing rest breaks to complete distance.   Stairs             Wheelchair Mobility     Tilt Bed    Modified Rankin (Stroke Patients Only)       Balance Overall balance assessment: Needs assistance Sitting-balance support: Feet supported, No upper extremity supported Sitting balance-Leahy Scale: Fair     Standing balance support: Bilateral upper extremity supported, During functional activity, Reliant on assistive device for balance Standing balance-Leahy Scale: Poor Standing balance comment: CGA with RW for support.                            Communication Communication Communication: No apparent difficulties  Cognition Arousal: Alert Behavior During Therapy: WFL for tasks assessed/performed   PT - Cognitive impairments: Awareness, Memory                       PT - Cognition Comments: More aware today but still a bit tangetial regarding her issues and prior medical hx Following commands: Intact  Cueing Cueing Techniques: Verbal cues, Gestural cues  Exercises General Exercises - Lower Extremity Ankle Circles/Pumps: AROM, Both, 10 reps, Seated Quad Sets: Strengthening, Both, 10 reps, Seated Gluteal Sets: Strengthening, 5 reps, 10 reps, Seated    General Comments General comments (skin integrity, edema, etc.): SpO2 91-93% on 3.5L at rest.      Pertinent Vitals/Pain Pain Assessment Pain  Assessment: Faces Faces Pain Scale: Hurts even more Pain Location: Lt hip, knee, and foot Pain Descriptors / Indicators: Aching Pain Intervention(s): Limited activity within patient's tolerance, Monitored during session, Repositioned    Home Living Family/patient expects to be discharged to:: Private residence Living Arrangements: Spouse/significant other;Children                      Prior Function            PT Goals (current goals can now be found in the care plan section) Acute Rehab PT Goals Patient Stated Goal: Decrease pain PT Goal Formulation: With patient Time For Goal Achievement: 01/05/24 Potential to Achieve Goals: Good Progress towards PT goals: Progressing toward goals    Frequency    Min 2X/week      PT Plan      Co-evaluation              AM-PAC PT 6 Clicks Mobility   Outcome Measure  Help needed turning from your back to your side while in a flat bed without using bedrails?: A Little Help needed moving from lying on your back to sitting on the side of a flat bed without using bedrails?: A Little Help needed moving to and from a bed to a chair (including a wheelchair)?: A Little Help needed standing up from a chair using your arms (e.g., wheelchair or bedside chair)?: A Little Help needed to walk in hospital room?: A Little Help needed climbing 3-5 steps with a railing? : A Lot 6 Click Score: 17    End of Session Equipment Utilized During Treatment: Gait belt;Oxygen Activity Tolerance: Patient tolerated treatment well;Patient limited by fatigue Patient left: in chair;with call bell/phone within reach;with chair alarm set   PT Visit Diagnosis: Unsteadiness on feet (R26.81);Other abnormalities of gait and mobility (R26.89);Pain;Muscle weakness (generalized) (M62.81) Pain - Right/Left: Left Pain - part of body: Hip;Knee     Time: 1440-1505 PT Time Calculation (min) (ACUTE ONLY): 25 min  Charges:    $Gait Training: 8-22  mins $Therapeutic Activity: 8-22 mins PT General Charges $$ ACUTE PT VISIT: 1 Visit                     Leontine Roads, PT, DPT [facility]  Rehabilitation Services Physical Therapist Office: [phone] Website: [email or web address]    Leontine GORMAN Roads 12/24/2023, 3:52 PM

## 2023-12-24 NOTE — Telephone Encounter (Signed)
 Gave pt a call pt is coming up for re-enrollment on [employer] , spoke with pt explain she received pre-approved letter has not call ,but will be calling [employer] today, pt will call back incase she needs pap mail out.

## 2023-12-24 NOTE — Care Management Important Message (Signed)
 Important Message  Patient Details  Name: Kerry Powell MRN: [id number] Date of Birth: February 13, 1946   Important Message Given:  Yes - Medicare IM     Claretta Deed 12/24/2023, 1:23 PM

## 2023-12-24 NOTE — Progress Notes (Signed)
 Progress Note   Patient: Kerry Powell FMW:[id number] DOB: [birth year] DOA: 12/20/2023     4 DOS: the patient was seen and examined on 12/24/2023   Brief hospital course: Kerry Powell is a 78 y.o. 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  Patient is admitted to TRH service for further management evaluation of respiratory failure, positive D-dimer, left lower extremity pain status post fall, AKI  Assessment and Plan: Acute on chronic hypoxic respiratory failure- Presented with worsening shortness of breath, tachycardia, increased oxygen requirement.  This is possibly due to underlying COPD, hypoventilation, atelectasis. D-dimer is elevated, VQ scan negative for PE. Venous Doppler negative for lower extremity DVT. Continue COPD inhalers and nebulizers as needed. PT OT advised SNF. PASSR, FL2 signed. Continue to wean oxygen to 2 L.   Left lower extremity pain Status post ground-level fall X-ray femur and left ankle imaging unremarkable for any fracture. Lumbar spine xray shows DJD, no acute findings. Continue Tylenol , Robaxin  and tramadol  for pain control.  Possible UTI- UA abnormal, has confusion, lower abdomen pain. Urine cultures pending. Continue Rocephin  therapy.  Acute on chronic kidney disease stage IIIb- Patient's baseline creatinine around 1.5-1.9. Patient presented with creatinine of 2.18. Creatinine today 2.05. continue to hold Lasix . Avoid nephrotoxic drugs. Continue to monitor daily renal function, electrolytes.  Hypokalemia-oral potassium supplementation ordered.  Neurocognitive disorder- Patient does report confusion, hallucinations.  Seems to have dementia. Continue supportive care, fall, aspiration precautions Delirium  precautions. She will need outpatient neurology and geropsych evaluation.  COPD Chronic respiratory failure with hypoxia on 2 L of oxygen at home. Continue bronchodilators, home inhalers.  HFpEF-no acute exacerbation.  Lasix  held due to renal dysfunction.  CAD/PAD-continue aspirin, Plavix , statin. Hypertension-hold telmisartan , continue metoprolol . Hyperlipidemia-continue statin therapy.  Type 2 diabetes mellitus-continue Accu-Cheks, sliding scale insulin .     TOC working on STR placement. Out of bed to chair. Incentive spirometry. Nursing supportive care. Fall, aspiration precautions. Diet:  Diet Orders (From admission, onward)     Start     Ordered   12/21/23 0138  Diet regular Room service appropriate? Yes; Fluid consistency: Thin  Diet effective now       Question Answer Comment  Room service appropriate? Yes   Fluid consistency: Thin      12/21/23 0138           DVT prophylaxis: heparin  injection 5,000 Units Start: 12/21/23 0600  Level of care: Telemetry Medical   Code Status: Limited: Do not attempt resuscitation (DNR) -DNR-LIMITED -Do Not Intubate/DNI   Subjective: Patient is seen and examined today morning.  She is more awake today, able to answer me appropriately.  Pain is better.  She is requiring 3-5 L supplemental oxygen.  Physical Exam: Vitals:   12/24/23 0757 12/24/23 0819 12/24/23 0911 12/24/23 1102  BP: 105/68  127/80 (!) 113/55  Pulse: (!) 102  (!) 105 98  Resp: 19   15  Temp: 97.7 F (36.5 C)   97.7 F (36.5 C)  TempSrc: Oral   Oral  SpO2: 91% 93%  90%  Weight:      Height:        General - Elderly obese Caucasian female, no distress HEENT - PERRLA, EOMI, atraumatic head, non tender sinuses.  Lung - Clear, basal rales, no rhonchi, wheezes. Heart - S1, S2 heard, no murmurs, rubs, 1+ pedal edema. Abdomen - Soft, non tender, bowel sounds good Neuro - sleepy, arousable, non focal exam. Skin - Warm and dry.  Leg jerking movements while  sleeping noted  Data Reviewed:      Latest Ref Rng & Units 12/21/2023    4:30 AM 12/20/2023    7:57 PM 11/22/2023    3:25 PM  CBC  WBC 4.0 - 10.5 K/uL 9.3  8.4  7.0   Hemoglobin 12.0 - 15.0 g/dL 86.9  85.7  87.0   Hematocrit 36.0 - 46.0 % 41.4  46.1  41.3   Platelets 150 - 400 K/uL 233  279  323.0       Latest Ref Rng & Units 12/24/2023    2:49 AM 12/23/2023   11:03 AM 12/22/2023    4:14 AM  BMP  Glucose 70 - 99 mg/dL 96  887  90   BUN 8 - 23 mg/dL 13  11  11    Creatinine 0.44 - 1.00 mg/dL 7.90  7.94  8.22   Sodium 135 - 145 mmol/L 133  138  138   Potassium 3.5 - 5.1 mmol/L 3.7  3.7  3.6   Chloride 98 - 111 mmol/L 97  97  104   CO2 22 - 32 mmol/L 27  27  23    Calcium  8.9 - 10.3 mg/dL 9.3  9.7  9.6    NM Pulmonary Perfusion Result Date: 12/23/2023 CLINICAL DATA:  Acute on chronic hypoxic respiratory failure. Elevated D-dimer levels. Question pulmonary embolism. EXAM: NUCLEAR MEDICINE PERFUSION LUNG SCAN TECHNIQUE: Perfusion images were obtained in multiple projections after intravenous injection of radiopharmaceutical. No ventilation imaging performed per usual protocol. RADIOPHARMACEUTICALS:  4.04 mCi Tc-65m MAA IV COMPARISON:  Radiographs 12/23/2023 and 12/20/2023. Chest CTA 12/04/2023 FINDINGS: The pulmonary perfusion is within normal limits. There are no wedge-shaped perfusion defects to suggest acute pulmonary embolism. IMPRESSION: No evidence of acute pulmonary embolism on perfusion scintigraphy by PISAPED criteria. Electronically Signed   By: Elsie Perone M.D.   On: 12/23/2023 16:12   DG CHEST PORT 1 VIEW Result Date: 12/23/2023 CLINICAL DATA:  Hypoxia. EXAM: PORTABLE CHEST 1 VIEW COMPARISON:  Radiographs 12/20/2023 and 10/21/2023. Chest CTA 12/04/2023. FINDINGS: 1113 hours. The heart size and mediastinal contours are stable with aortic atherosclerosis. There is stable mild biapical scarring. The lungs otherwise appear clear. No evidence of pleural effusion or  pneumothorax. Asymmetric glenohumeral degenerative changes on the left without evidence of acute osseous abnormality. IMPRESSION: No evidence of acute cardiopulmonary process. Aortic atherosclerosis. Electronically Signed   By: Elsie Perone M.D.   On: 12/23/2023 16:08   VAS US  LOWER EXTREMITY VENOUS (DVT) Result Date: 12/23/2023  Lower Venous DVT Study Patient Name:  XIOMARA SEVILLANO  Date of Exam:   12/22/2023 Medical Rec #: [id number]       Accession #:    [id number] Date of Birth: 1945/07/10       Patient Gender: F Patient Age:   10 years Exam Location:  [facility] Procedure:      VAS US  LOWER EXTREMITY VENOUS (DVT) Referring Phys: DORN DAWSON --------------------------------------------------------------------------------  Indications: Edema.  Risk Factors: None identified. Limitations: Poor ultrasound/tissue interface and patient positioning. Comparison Study: No prior studies. Performing Technologist: Cordella Collet RVT  Examination Guidelines: A complete evaluation includes B-mode imaging, spectral Doppler, color Doppler, and power Doppler as needed of all accessible portions of each vessel. Bilateral testing  is considered an integral part of a complete examination. Limited examinations for reoccurring indications may be performed as noted. The reflux portion of the exam is performed with the patient in reverse Trendelenburg.  +---------+---------------+---------+-----------+----------+--------------+ RIGHT    CompressibilityPhasicitySpontaneityPropertiesThrombus Aging +---------+---------------+---------+-----------+----------+--------------+ CFV      Full           Yes      Yes                                 +---------+---------------+---------+-----------+----------+--------------+ SFJ      Full                                                        +---------+---------------+---------+-----------+----------+--------------+ FV Prox  Full                                                         +---------+---------------+---------+-----------+----------+--------------+ FV Mid   Full                                                        +---------+---------------+---------+-----------+----------+--------------+ FV DistalFull                                                        +---------+---------------+---------+-----------+----------+--------------+ PFV      Full                                                        +---------+---------------+---------+-----------+----------+--------------+ POP      Full           Yes      Yes                                 +---------+---------------+---------+-----------+----------+--------------+ PTV      Full                                                        +---------+---------------+---------+-----------+----------+--------------+ PERO     Full                                                        +---------+---------------+---------+-----------+----------+--------------+   +---------+---------------+---------+-----------+----------+-------------------+ LEFT     CompressibilityPhasicitySpontaneityPropertiesThrombus Aging      +---------+---------------+---------+-----------+----------+-------------------+  CFV      Full           Yes      Yes                                      +---------+---------------+---------+-----------+----------+-------------------+ SFJ      Full                                                             +---------+---------------+---------+-----------+----------+-------------------+ FV Prox  Full                                                             +---------+---------------+---------+-----------+----------+-------------------+ FV Mid   Full                                                             +---------+---------------+---------+-----------+----------+-------------------+ FV DistalFull                                                              +---------+---------------+---------+-----------+----------+-------------------+ PFV      Full                                                             +---------+---------------+---------+-----------+----------+-------------------+ POP      Full           Yes      Yes                                      +---------+---------------+---------+-----------+----------+-------------------+ PTV      Full                                                             +---------+---------------+---------+-----------+----------+-------------------+ PERO                                                  Not well visualized +---------+---------------+---------+-----------+----------+-------------------+     Summary: RIGHT: - There is no evidence of deep vein thrombosis in the lower extremity.  - No cystic  structure found in the popliteal fossa.  LEFT: - There is no evidence of deep vein thrombosis in the lower extremity. However, portions of this examination were limited- see technologist comments above.  - No cystic structure found in the popliteal fossa.  *See table(s) above for measurements and observations. Electronically signed by Gaile New MD on 12/23/2023 at 10:25:41 AM.    Final    DG Lumbar Spine 1 View Result Date: 12/22/2023 CLINICAL DATA:  Low back pain. EXAM: LUMBAR SPINE - 1 VIEW COMPARISON:  None Available. FINDINGS: There is no evidence of acute lumbar spine fracture. There is mild anterolisthesis at L4-L5. Intervertebral disc space narrowing is noted at multiple levels and most pronounced at L5-S1. Facet arthropathy is present throughout the lumbar spine. There is atherosclerotic calcification aorta. IMPRESSION: 1. No acute fracture. 2. Multilevel degenerative changes in the lumbar spine. 3. Aortic atherosclerosis. Electronically Signed   By: Leita Birmingham M.D.   On: 12/22/2023 19:12    Family Communication: No family at  bedside.  Disposition: Status is: Inpatient Remains inpatient appropriate because: Confusion, UTI, placement  Planned Discharge Destination: Rehab     Time spent: 46 minutes  Author: Concepcion Riser, MD 12/24/2023 2:32 PM Secure chat 7am to 7pm For on call review [email or web address].

## 2023-12-24 NOTE — Progress Notes (Signed)
 Mobility Specialist Progress Note;   12/24/23 0916  Mobility  Activity Pivoted/transferred from bed to chair  Level of Assistance Minimal assist, patient does 75% or more  Assistive Device Front wheel walker  Distance Ambulated (ft) 5 ft  Activity Response Tolerated well  Mobility Referral Yes  Mobility visit 1 Mobility  Mobility Specialist Start Time (ACUTE ONLY) [id number]  Mobility Specialist Stop Time (ACUTE ONLY) [id number]  Mobility Specialist Time Calculation (min) (ACUTE ONLY) 7 min   Pt agreeable to mobility. Required MinA to stand from EoB and safely transfer from bed to chair. HR up to 121 w/ exertion. C/o LLE pain throughout. Pt left comfortably in chair with all needs met, call bell in reach. RN in room.   Lauraine Erm Mobility Specialist Please contact via SecureChat or [employer] [phone]

## 2023-12-25 DIAGNOSIS — J9611 Chronic respiratory failure with hypoxia: Secondary | ICD-10-CM

## 2023-12-25 DIAGNOSIS — J432 Centrilobular emphysema: Secondary | ICD-10-CM

## 2023-12-25 DIAGNOSIS — E1122 Type 2 diabetes mellitus with diabetic chronic kidney disease: Secondary | ICD-10-CM | POA: Diagnosis not present

## 2023-12-25 DIAGNOSIS — N1832 Chronic kidney disease, stage 3b: Secondary | ICD-10-CM

## 2023-12-25 DIAGNOSIS — R296 Repeated falls: Secondary | ICD-10-CM | POA: Diagnosis not present

## 2023-12-25 DIAGNOSIS — D631 Anemia in chronic kidney disease: Secondary | ICD-10-CM

## 2023-12-25 DIAGNOSIS — I130 Hypertensive heart and chronic kidney disease with heart failure and stage 1 through stage 4 chronic kidney disease, or unspecified chronic kidney disease: Secondary | ICD-10-CM | POA: Diagnosis not present

## 2023-12-25 DIAGNOSIS — M1712 Unilateral primary osteoarthritis, left knee: Secondary | ICD-10-CM

## 2023-12-25 DIAGNOSIS — M19072 Primary osteoarthritis, left ankle and foot: Secondary | ICD-10-CM

## 2023-12-25 DIAGNOSIS — J9601 Acute respiratory failure with hypoxia: Secondary | ICD-10-CM | POA: Diagnosis not present

## 2023-12-25 DIAGNOSIS — S72142D Displaced intertrochanteric fracture of left femur, subsequent encounter for closed fracture with routine healing: Secondary | ICD-10-CM | POA: Diagnosis not present

## 2023-12-25 DIAGNOSIS — I5032 Chronic diastolic (congestive) heart failure: Secondary | ICD-10-CM | POA: Diagnosis not present

## 2023-12-25 DIAGNOSIS — M79605 Pain in left leg: Secondary | ICD-10-CM | POA: Diagnosis not present

## 2023-12-25 DIAGNOSIS — E1151 Type 2 diabetes mellitus with diabetic peripheral angiopathy without gangrene: Secondary | ICD-10-CM

## 2023-12-25 DIAGNOSIS — I2510 Atherosclerotic heart disease of native coronary artery without angina pectoris: Secondary | ICD-10-CM

## 2023-12-25 LAB — GLUCOSE, CAPILLARY
Glucose-Capillary: 110 mg/dL — ABNORMAL HIGH (ref 70–99)
Glucose-Capillary: 94 mg/dL (ref 70–99)

## 2023-12-25 LAB — BASIC METABOLIC PANEL WITH GFR
Anion gap: 12 (ref 5–15)
BUN: 17 mg/dL (ref 8–23)
CO2: 27 mmol/L (ref 22–32)
Calcium: 10 mg/dL (ref 8.9–10.3)
Chloride: 94 mmol/L — ABNORMAL LOW (ref 98–111)
Creatinine, Ser: 1.95 mg/dL — ABNORMAL HIGH (ref 0.44–1.00)
GFR, Estimated: 26 mL/min — ABNORMAL LOW (ref >60)
Glucose, Bld: 111 mg/dL — ABNORMAL HIGH (ref 70–99)
Potassium: 3.8 mmol/L (ref 3.5–5.1)
Sodium: 133 mmol/L — ABNORMAL LOW (ref 135–145)

## 2023-12-25 LAB — CULTURE, BLOOD (ROUTINE X 2)
Culture: NO GROWTH
Culture: NO GROWTH
Special Requests: ADEQUATE

## 2023-12-25 MED ORDER — SODIUM CHLORIDE 0.9 % IV BOLUS
500.0000 mL | Freq: Once | INTRAVENOUS | Status: AC
  Administered 2023-12-25: 500 mL via INTRAVENOUS

## 2023-12-25 MED ORDER — DICLOFENAC SODIUM 1 % EX GEL
2.0000 g | Freq: Four times a day (QID) | CUTANEOUS | Status: DC
  Administered 2023-12-25 – 2023-12-31 (×9): 2 g via TOPICAL
  Filled 2023-12-25: qty 100

## 2023-12-25 MED ORDER — IPRATROPIUM-ALBUTEROL 0.5-2.5 (3) MG/3ML IN SOLN
3.0000 mL | RESPIRATORY_TRACT | Status: DC | PRN
  Administered 2023-12-27 – 2023-12-30 (×2): 3 mL via RESPIRATORY_TRACT
  Filled 2023-12-25 (×2): qty 3

## 2023-12-25 MED ORDER — METOPROLOL SUCCINATE ER 25 MG PO TB24
25.0000 mg | ORAL_TABLET | Freq: Every day | ORAL | Status: DC
Start: 2023-12-25 — End: 2023-12-31
  Administered 2023-12-25 – 2023-12-31 (×7): 25 mg via ORAL
  Filled 2023-12-25 (×7): qty 1

## 2023-12-25 MED ORDER — ACETAMINOPHEN 325 MG PO TABS
975.0000 mg | ORAL_TABLET | Freq: Four times a day (QID) | ORAL | Status: DC
  Administered 2023-12-25 – 2023-12-31 (×20): 975 mg via ORAL
  Filled 2023-12-25 (×21): qty 3

## 2023-12-25 MED ORDER — OXYCODONE HCL 5 MG PO TABS
5.0000 mg | ORAL_TABLET | Freq: Three times a day (TID) | ORAL | Status: DC | PRN
  Administered 2023-12-26: 5 mg via ORAL
  Filled 2023-12-25: qty 1

## 2023-12-25 MED ORDER — ALUM & MAG HYDROXIDE-SIMETH 200-200-20 MG/5ML PO SUSP
30.0000 mL | Freq: Four times a day (QID) | ORAL | Status: DC | PRN
  Administered 2023-12-25 – 2023-12-29 (×3): 30 mL via ORAL
  Filled 2023-12-25 (×3): qty 30

## 2023-12-25 MED ORDER — GUAIFENESIN-DM 100-10 MG/5ML PO SYRP
15.0000 mL | ORAL_SOLUTION | ORAL | Status: DC | PRN
  Administered 2023-12-30: 15 mL via ORAL
  Filled 2023-12-25: qty 20

## 2023-12-25 NOTE — Progress Notes (Signed)
 Compressions stockings placed on patient without any complications.

## 2023-12-25 NOTE — Plan of Care (Signed)
  Problem: Fluid Volume: Goal: Ability to maintain a balanced intake and output will improve Outcome: Progressing   Problem: Metabolic: Goal: Ability to maintain appropriate glucose levels will improve Outcome: Progressing   Problem: Skin Integrity: Goal: Risk for impaired skin integrity will decrease Outcome: Progressing   Problem: Education: Goal: Knowledge of General Education information will improve Description: Including pain rating scale, medication(s)/side effects and non-pharmacologic comfort measures Outcome: Progressing   Problem: Clinical Measurements: Goal: Will remain free from infection Outcome: Progressing   Problem: Activity: Goal: Risk for activity intolerance will decrease Outcome: Progressing

## 2023-12-25 NOTE — Progress Notes (Signed)
 VAST consult received to obtain PIV access. Pt's antibiotic is not due until tomorrow morning at 1000. No other IV meds/fluids or studies ordered. Best practice is to place IV access only when needed to allow for vein preservation and reduce the risk of infection from unneccessary invasive procedures. Pt's nurse further educated if pt's condition changes and IV access is needed emergently, IV team consult should be placed STAT with a comment as to why an emergent IV is needed.

## 2023-12-25 NOTE — TOC Progression Note (Addendum)
 Transition of Care [facility]) - Progression Note    Patient Details  Name: Kerry Powell MRN: [id number] Date of Birth: Jun 27, 1945  [facility] CM/SW Contact  Lauraine FORBES Saa, LCSWA Phone Number: 12/25/2023, 2:35 PM  Clinical Narrative:     2:35 PM CSW informed patient of [facility] bed acceptance and offered SNF Medicare ratings. Patient declined CSW offer of [facility] SNF Medicare ratings. CSW informed SNF of bed acceptance and inability to submit SNF insurance authorization request due technical errors of [employer] portal. Patient's PASRR remains pending. CSW will continue to follow.  Expected Discharge Plan: Skilled Nursing Facility Barriers to Discharge: Continued Medical Work up, English As A Second Language Teacher, SNF Pending bed offer               Expected Discharge Plan and Services In-house Referral: Clinical Social Work   Post Acute Care Choice: Skilled Nursing Facility Living arrangements for the past 2 months: Single Family Home                                       Social Drivers of Health (SDOH) Interventions SDOH Screenings   Food Insecurity: No Food Insecurity (12/21/2023)  Housing: Low Risk  (12/21/2023)  Transportation Needs: No Transportation Needs (12/21/2023)  Utilities: Not At Risk (12/21/2023)  Alcohol  Screen: Low Risk  (05/15/2023)  Depression (PHQ2-9): Low Risk  (11/20/2023)  Financial Resource Strain: Low Risk  (12/08/2023)  Physical Activity: Inactive (12/08/2023)  Social Connections: Unknown (12/24/2023)  Recent Concern: Social Connections - Socially Isolated (12/21/2023)  Stress: Stress Concern Present (12/08/2023)  Tobacco Use: Medium Risk (12/23/2023)  Health Literacy: Adequate Health Literacy (05/15/2023)    Readmission Risk Interventions     No data to display

## 2023-12-25 NOTE — Progress Notes (Signed)
 Mobility Specialist Progress Note;   12/25/23 0946  Mobility  Activity Pivoted/transferred from bed to chair  Level of Assistance Contact guard assist, steadying assist  Assistive Device Front wheel walker  Distance Ambulated (ft) 5 ft  Activity Response Tolerated well  Mobility Referral Yes  Mobility visit 1 Mobility  Mobility Specialist Start Time (ACUTE ONLY) 0946  Mobility Specialist Stop Time (ACUTE ONLY) 1012  Mobility Specialist Time Calculation (min) (ACUTE ONLY) 26 min   Pt agreeable to mobility. Required MinG assistance to safely ambulate to chair. VSS on 2LO2. Only c/o LLE pain w/ ambulation. MD in room to speak about this. Pt left in chair with all needs met, alarm on.    Lauraine Erm Mobility Specialist Please contact via SecureChat or [employer] [phone]

## 2023-12-25 NOTE — Progress Notes (Signed)
 PROGRESS NOTE  Kerry Powell FMW:[id number] DOB: [birth year]   PCP: Geofm Glade PARAS, MD  Patient is from: Home  DOA: 12/20/2023 LOS: 5  Chief complaints Chief Complaint  Patient presents with   Shortness of Breath     Brief Narrative / Interim history: 78 y.o. 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    CXR with hyperinflation and emphysema.  Pelvic x-ray without acute finding.  Left tibial/fibular x-ray with septal cortical defect along the anterior aspect of the lateral malleolus on the lateral view only, and osteopenia.  Left ankle x-ray without acute finding.  CT head without acute finding.  Left femoral x-ray without acute finding or hardware complication but mild degenerative changes of medial compartment of the knee.  Lumbar film with multilevel degenerative change. D-dimer was elevated but VQ scan was negative for PE.  Lower extremity venous Doppler was negative for DVT.  Patient was treated for COPD exacerbation with nebulizers and inhalers with improvement in her oxygen requirement and breathing.  While in hospital, patient had a UA with large pyuria and microscopic hematuria, and started on IV ceftriaxone  for possible UTI.  Urine culture NGTD.  Subjective: Seen and examined earlier this morning.  No major events overnight or this morning.  Continues to endorse left lower extremity pain, worse with weightbearing.  Pain goes from left hip all the way down to her foot.  Not able to describe the pain.   Assessment and plan: Acute on chronic hypoxic respiratory failure- worsening shortness of breath, tachycardia, increased  oxygen requirement.  DVT ruled out by negative VQ scan and lower extremity venous Doppler.  CXR suggested hyperinflation/emphysema. -Continue inhalers-triple therapy with as needed DuoNebs. -Wean oxygen as able.  Minimum oxygen to keep saturation above 90% regardless of home 2 L -Ambulatory saturation  COPD exacerbation?  Presented with shortness of breath.  Did not require steroid -Continue inhalers as above.   Recurrent fall at home-trauma survey negative as above Left lower extremity pain-persistent pain since left femoral fracture for which she had IM nailing on 7/28.  She has tenderness over left trochanter but difficult to tell if the tenderness is due to instrumentation -Schedule Tylenol  -Continue as needed Robaxin , tramadol  and oxycodone . -Added Voltaren gel   Possible UTI-patient had some confusion and lower abdominal pain.  UA obtained and showed pyuria with hematuria.  Started on ceftriaxone .  Urine culture NGTD -Continue short course of ceftriaxone  for 3 days   AKI on CKD-3B: Cr 2.18 (was 1.32 on 9/26).  Slowly improving.  Suspect dehydration from Lasix  and Farxiga . Recent Labs    10/26/23 0519 10/27/23 0721 10/28/23 0648 10/31/23 0457 11/22/23 1525 12/20/23 1957 12/21/23 0430 12/22/23 0414 12/23/23 1103 12/24/23 0249 12/25/23 0306  BUN 28* 27* 23  --  8 16 13 11 11 13 17   CREATININE 1.57* 1.44* 1.42* 1.45* 1.32* 2.18* 1.94* 1.77* 2.05* 2.09* 1.95*  - Avoid nephrotoxic meds - Monitor off IV fluid.   Chronic HFpEF: TTE in 09/2023 with LVEF of 60 to 65%, G1 DD and normal RV SF.  Appears euvolemic on exam. -Continue holding home Lasix  and Farxiga . - Monitor fluid and respiratory status.  History of CAD/PAD: Stable. -Continue home Toprol -XL, aspirin, Plavix  and statin.  Delirium reportedly had confusion and hallucination likely from pain meds.  No formal diagnosis of cognitive impairment. -Minimize sedating medications -Delirium precaution -Outpatient follow-up  with neurology for formal evaluation  Controlled NIDDM-2: Last A1c 6.4% on 8/11.  On Farxiga  at home.  CBG within normal range for most part. -Discontinue CBG monitoring and SSI.  Essential hypertension: Normotensive -Continue Toprol -XL.  Hyperlipidemia -continue statin therapy.    Body mass index is 29.57 kg/m.           DVT prophylaxis:  heparin  injection 5,000 Units Start: 12/21/23 0600  Code Status: DNR Family Communication: None at bedside Level of care: Telemetry Medical Status is: Inpatient Remains inpatient appropriate because: Respiratory failure, left leg pain and recurrent fall   Final disposition: SNF   55 minutes with more than 50% spent in reviewing records, counseling patient/family and coordinating care.  Consultants:  None  Procedures: None  Microbiology summarized: Blood cultures NGTD Urine cultures NGTD MRSA PCR screen nonreactive COVID-19, influenza and RSV PCR nonreactive  Objective: Vitals:   12/25/23 0352 12/25/23 0747 12/25/23 0848 12/25/23 1127  BP:  119/75 119/75 121/83  Pulse:  (!) 109 (!) 105 100  Resp:  16  15  Temp:  97.7 F (36.5 C)  98 F (36.7 C)  TempSrc:  Oral  Oral  SpO2:  99%  100%  Weight: 80.6 kg     Height:        Examination:  GENERAL: No apparent distress.  Nontoxic. HEENT: MMM.  Vision and hearing grossly intact.  NECK: Supple.  No apparent JVD.  RESP:  No IWOB.  Fair aeration bilaterally. CVS:  RRR. Heart sounds normal.  ABD/GI/GU: BS+. Abd soft, NTND.  MSK/EXT:  Moves extremities. Subtotal weakness in left leg.  Tenderness over left greater trochanter.  No edema.  SKIN: no apparent skin lesion or wound NEURO: AA.  Oriented appropriately.  Subtle weakness in left leg.  PSYCH: Calm. Normal affect.   Sch Meds:  Scheduled Meds:  acetaminophen   975 mg Oral Q6H WA   allopurinol   300 mg Oral Daily   aspirin EC  81 mg Oral Daily   clopidogrel   75 mg Oral Daily   diclofenac Sodium  2 g Topical QID    DULoxetine   60 mg Oral Daily   fluticasone  furoate-vilanterol  1 puff Inhalation Daily   heparin  injection (subcutaneous)  5,000 Units Subcutaneous Q8H   insulin  aspart  0-6 Units Subcutaneous TID WC   metoprolol  succinate  25 mg Oral Daily   potassium chloride  SA  20 mEq Oral Daily   rosuvastatin   10 mg Oral Daily   sodium chloride  flush  3 mL Intravenous Q12H   umeclidinium bromide   1 puff Inhalation Daily   Continuous Infusions:  cefTRIAXone  (ROCEPHIN )  IV 2 g (12/25/23 0855)   PRN Meds:.albuterol , alum & mag hydroxide-simeth, guaiFENesin -dextromethorphan, melatonin, methocarbamol , ondansetron  (ZOFRAN ) IV, oxyCODONE , polyethylene glycol, traMADol   Antimicrobials: Anti-infectives (From admission, onward)    Start     Dose/Rate Route Frequency Ordered Stop   12/23/23 1000  cefTRIAXone  (ROCEPHIN ) 2 g in sodium chloride  0.9 % 100 mL IVPB        2 g 200 mL/hr over 30 Minutes Intravenous Every 24 hours 12/23/23 0843          I have personally reviewed the following labs and images: CBC: Recent Labs  Lab 12/20/23 1957 12/21/23 0430  WBC 8.4 9.3  HGB 14.2 13.0  HCT 46.1* 41.4  MCV 95.4 95.4  PLT 279 233   BMP &GFR Recent Labs  Lab 12/21/23 0430 12/22/23 0414 12/23/23 1103 12/24/23 0249 12/25/23 0306  NA 135 138 138 133* 133*  K 3.7 3.6 3.7 3.7 3.8  CL 101 104 97* 97* 94*  CO2 21* 23 27 27 27   GLUCOSE 92 90 112* 96 111*  BUN 13 11 11 13 17   CREATININE 1.94* 1.77* 2.05* 2.09* 1.95*  CALCIUM  9.3 9.6 9.7 9.3 10.0  MG 1.7  --   --   --   --   PHOS 3.9  --   --   --   --    Estimated Creatinine Clearance: 24.9 mL/min (A) (by C-G formula based on SCr of 1.95 mg/dL (H)). Liver & Pancreas: Recent Labs  Lab 12/20/23 1957  AST 22  ALT 11  ALKPHOS 136*  BILITOT 0.9  PROT 7.2  ALBUMIN  3.4*   No results for input(s): LIPASE, AMYLASE in the last 168 hours. No results for input(s): AMMONIA in the last 168 hours. Diabetic: No results for input(s): HGBA1C in  the last 72 hours. Recent Labs  Lab 12/24/23 1101 12/24/23 1603 12/24/23 2105 12/25/23 0553 12/25/23 1126  GLUCAP 99 109* 75 110* 94   Cardiac Enzymes: No results for input(s): CKTOTAL, CKMB, CKMBINDEX, TROPONINI in the last 168 hours. No results for input(s): PROBNP in the last 8760 hours. Coagulation Profile: Recent Labs  Lab 12/20/23 2055  INR 1.0   Thyroid  Function Tests: No results for input(s): TSH, T4TOTAL, FREET4, T3FREE, THYROIDAB in the last 72 hours. Lipid Profile: No results for input(s): CHOL, HDL, LDLCALC, TRIG, CHOLHDL, LDLDIRECT in the last 72 hours. Anemia Panel: No results for input(s): VITAMINB12, FOLATE, FERRITIN, TIBC, IRON, RETICCTPCT in the last 72 hours. Urine analysis:    Component Value Date/Time   COLORURINE YELLOW 12/22/2023 1339   APPEARANCEUR TURBID (A) 12/22/2023 1339   LABSPEC 1.010 12/22/2023 1339   PHURINE 6.0 12/22/2023 1339   GLUCOSEU >=500 (A) 12/22/2023 1339   GLUCOSEU >=1000 (A) 05/15/2022 1554   HGBUR MODERATE (A) 12/22/2023 1339   BILIRUBINUR NEGATIVE 12/22/2023 1339   BILIRUBINUR negative 09/25/2022 1027   KETONESUR NEGATIVE 12/22/2023 1339   PROTEINUR 100 (A) 12/22/2023 1339   UROBILINOGEN 0.2 09/25/2022 1027   UROBILINOGEN 0.2 05/15/2022 1554   NITRITE NEGATIVE 12/22/2023 1339   LEUKOCYTESUR LARGE (A) 12/22/2023 1339   Sepsis Labs: Invalid input(s): PROCALCITONIN, LACTICIDVEN  Microbiology: Recent Results (from the past 240 hours)  Resp panel by RT-PCR (RSV, Flu A&B, Covid) Anterior Nasal Swab     Status: None   Collection Time: 12/20/23  3:58 PM   Specimen: Anterior Nasal Swab  Result Value Ref Range Status   SARS Coronavirus 2 by RT PCR NEGATIVE NEGATIVE Final   Influenza A by PCR NEGATIVE NEGATIVE Final   Influenza B by PCR NEGATIVE NEGATIVE Final    Comment: (NOTE) The Xpert Xpress SARS-CoV-2/FLU/RSV plus assay is intended as an aid in the diagnosis of influenza  from Nasopharyngeal swab specimens and should not be used as a sole basis for treatment. Nasal washings and aspirates are unacceptable for Xpert Xpress SARS-CoV-2/FLU/RSV testing.  Fact Sheet for Patients: bloggercourse.com  Fact Sheet for Healthcare Providers: seriousbroker.it  This test is not yet approved or cleared by the [place]  FDA and has been authorized for detection and/or diagnosis of SARS-CoV-2 by FDA under an Emergency Use Authorization (EUA). This EUA will remain in effect (meaning this test can be used)  for the duration of the COVID-19 declaration under Section 564(b)(1) of the Act, 21 U.S.C. section 360bbb-3(b)(1), unless the authorization is terminated or revoked.     Resp Syncytial Virus by PCR NEGATIVE NEGATIVE Final    Comment: (NOTE) Fact Sheet for Patients: bloggercourse.com  Fact Sheet for Healthcare Providers: seriousbroker.it  This test is not yet approved or cleared by the [place]  FDA and has been authorized for detection and/or diagnosis of SARS-CoV-2 by FDA under an Emergency Use Authorization (EUA). This EUA will remain in effect (meaning this test can be used) for the duration of the COVID-19 declaration under Section 564(b)(1) of the Act, 21 U.S.C. section 360bbb-3(b)(1), unless the authorization is terminated or revoked.  Performed at [facility], [address] [place], [name] [address]   Culture, blood (routine x 2)     Status: None   Collection Time: 12/20/23  7:57 PM   Specimen: BLOOD  Result Value Ref Range Status   Specimen Description BLOOD BLOOD RIGHT ARM  Final   Special Requests   Final    AEROBIC BOTTLE ONLY Blood Culture results may not be optimal due to an inadequate volume of blood received in culture bottles   Culture   Final    NO GROWTH 5 DAYS Performed at [facility], [address],  [place], [name] [address]    Report Status 12/25/2023 FINAL  Final  Culture, blood (routine x 2)     Status: None   Collection Time: 12/20/23  7:57 PM   Specimen: BLOOD  Result Value Ref Range Status   Specimen Description BLOOD BLOOD LEFT ARM  Final   Special Requests   Final    BOTTLES DRAWN AEROBIC AND ANAEROBIC Blood Culture adequate volume   Culture   Final    NO GROWTH 5 DAYS Performed at [facility], [address] [place], [name] [address]    Report Status 12/25/2023 FINAL  Final  MRSA Next Gen by PCR, Nasal     Status: None   Collection Time: 12/21/23  8:40 AM   Specimen: Nasal Mucosa; Nasal Swab  Result Value Ref Range Status   MRSA by PCR Next Gen NOT DETECTED NOT DETECTED Final    Comment: (NOTE) The GeneXpert MRSA Assay (FDA approved for NASAL specimens only), is one component of a comprehensive MRSA colonization surveillance program. It is not intended to diagnose MRSA infection nor to guide or monitor treatment for MRSA infections. Test performance is not FDA approved in patients less than 6 years old. Performed at [facility], [address] [place], [name] [address]   Urine Culture (for pregnant, neutropenic or urologic patients or patients with an indwelling urinary catheter)     Status: None   Collection Time: 12/23/23 11:07 AM   Specimen: Urine, Clean Catch  Result Value Ref Range Status   Specimen Description URINE, CLEAN CATCH  Final   Special Requests NONE  Final   Culture   Final    NO GROWTH Performed at [facility], [address], [place], [name] [address]    Report Status 12/24/2023 FINAL  Final    Radiology Studies: No results found.    Amry Cathy T. Alyxandria Wentz [facility]  If 7PM-7AM, please contact night-coverage [email or web address] 12/25/2023, 12:14 PM

## 2023-12-25 NOTE — Progress Notes (Signed)
 Patient belongings gathered, report called to receiving RN and patient was wheeled up to 5N-13.

## 2023-12-25 NOTE — Progress Notes (Signed)
 Physical Therapy Treatment Patient Details Name: Kerry Powell MRN: [id number] DOB: March 18, 1945 Today's Date: 12/25/2023   History of Present Illness Pt is 78 yo presenting to [facility] on 10/24 due to AMS and increased shortness of breathe with exertion and increased O2 demands. PMH includes Recent IMN of the L femur on 8/28, DMII, CKD, HFpEF, COPD.    PT Comments  Pt is currently limited in progressing goals due to BP. No compression socks were noted in pt room or on pt LE this session. Pt with greater than 20 point drop systolic in standing from sitting with pt reporting dizziness/lightheadedness. Due to pt current functional status, home set up and available assistance at home recommending skilled physical therapy services < 3 hours/day in order to address strength, balance and functional mobility to decrease risk for falls, injury, immobility, skin break down and re-hospitalization.      If plan is discharge home, recommend the following: Assist for transportation;Help with stairs or ramp for entrance;Assistance with cooking/housework   Can travel by private vehicle     Yes  Equipment Recommendations  None recommended by PT       Precautions / Restrictions Precautions Precautions: Fall Recall of Precautions/Restrictions: Impaired Precaution/Restrictions Comments: watch O2 and BP Restrictions Weight Bearing Restrictions Per Provider Order: No     Mobility  Bed Mobility   General bed mobility comments: sitting EOB initially the in recliner at end of session    Transfers Overall transfer level: Needs assistance Equipment used: Rolling walker (2 wheels) Transfers: Sit to/from Stand, Bed to chair/wheelchair/BSC Sit to Stand: Min assist, +2 safety/equipment, +2 physical assistance   Step pivot transfers: +2 physical assistance, +2 safety/equipment, Min assist       General transfer comment: Min assist +2 for initial momentum to stand, requires cues for hand placement to rise.  reports dizziness/fatigue in standing. min A for stability/safety transfering from EOB > BSC > recliner    Ambulation/Gait   General Gait Details: unable to progress today due to BP pt reporting dizziness in standing. BP 98/67 after standing, 121/83 at beginning of session sitting EOB      Balance Overall balance assessment: Needs assistance Sitting-balance support: Feet supported, No upper extremity supported Sitting balance-Leahy Scale: Fair     Standing balance support: Bilateral upper extremity supported, During functional activity, Reliant on assistive device for balance Standing balance-Leahy Scale: Poor Standing balance comment: min A +2 with dizziness using RW for support.      Communication Communication Communication: No apparent difficulties  Cognition Arousal: Alert Behavior During Therapy: WFL for tasks assessed/performed   PT - Cognitive impairments: Awareness, Memory     Following commands: Intact      Cueing Cueing Techniques: Verbal cues, Gestural cues     General Comments General comments (skin integrity, edema, etc.): Sitting BP 121/83, after standing 98/67, back in sitting 112/71. Pt reports feeling faint/dizzy      Pertinent Vitals/Pain Pain Assessment Faces Pain Scale: Hurts little more Pain Location: Lt hip, knee, and foot Pain Descriptors / Indicators: Aching Pain Intervention(s): Monitored during session, Limited activity within patient's tolerance     PT Goals (current goals can now be found in the care plan section) Acute Rehab PT Goals Patient Stated Goal: Decrease pain PT Goal Formulation: With patient Time For Goal Achievement: 01/05/24 Potential to Achieve Goals: Good Progress towards PT goals: Progressing toward goals    Frequency    Min 2X/week      PT Plan  Continue with  current POC        AM-PAC PT 6 Clicks Mobility   Outcome Measure  Help needed turning from your back to your side while in a flat bed without  using bedrails?: A Little Help needed moving from lying on your back to sitting on the side of a flat bed without using bedrails?: A Little Help needed moving to and from a bed to a chair (including a wheelchair)?: A Little Help needed standing up from a chair using your arms (e.g., wheelchair or bedside chair)?: A Little Help needed to walk in hospital room?: A Little Help needed climbing 3-5 steps with a railing? : A Lot 6 Click Score: 17    End of Session Equipment Utilized During Treatment: Gait belt;Oxygen Activity Tolerance: Patient tolerated treatment well;Patient limited by fatigue Patient left: in chair;with call bell/phone within reach;with chair alarm set Nurse Communication: Mobility status PT Visit Diagnosis: Unsteadiness on feet (R26.81);Other abnormalities of gait and mobility (R26.89);Pain;Muscle weakness (generalized) (M62.81) Pain - Right/Left: Left Pain - part of body: Hip;Knee     Time: 8867-8844 PT Time Calculation (min) (ACUTE ONLY): 23 min  Charges:    $Therapeutic Activity: 23-37 mins PT General Charges $$ ACUTE PT VISIT: 1 Visit                     Kerry Powell, DPT, CLT  Acute Rehabilitation Services Office: [phone] (Secure chat preferred)    Kerry Powell 12/25/2023, 1:25 PM

## 2023-12-26 DIAGNOSIS — R296 Repeated falls: Secondary | ICD-10-CM | POA: Diagnosis not present

## 2023-12-26 DIAGNOSIS — M79605 Pain in left leg: Secondary | ICD-10-CM | POA: Diagnosis not present

## 2023-12-26 DIAGNOSIS — J9601 Acute respiratory failure with hypoxia: Secondary | ICD-10-CM | POA: Diagnosis not present

## 2023-12-26 LAB — CBC
HCT: 41.2 % (ref 36.0–46.0)
Hemoglobin: 13.2 g/dL (ref 12.0–15.0)
MCH: 30 pg (ref 26.0–34.0)
MCHC: 32 g/dL (ref 30.0–36.0)
MCV: 93.6 fL (ref 80.0–100.0)
Platelets: 239 K/uL (ref 150–400)
RBC: 4.4 MIL/uL (ref 3.87–5.11)
RDW: 14.6 % (ref 11.5–15.5)
WBC: 8.9 K/uL (ref 4.0–10.5)
nRBC: 0 % (ref 0.0–0.2)

## 2023-12-26 LAB — RENAL FUNCTION PANEL
Albumin: 3 g/dL — ABNORMAL LOW (ref 3.5–5.0)
Anion gap: 12 (ref 5–15)
BUN: 25 mg/dL — ABNORMAL HIGH (ref 8–23)
CO2: 23 mmol/L (ref 22–32)
Calcium: 9.5 mg/dL (ref 8.9–10.3)
Chloride: 100 mmol/L (ref 98–111)
Creatinine, Ser: 1.72 mg/dL — ABNORMAL HIGH (ref 0.44–1.00)
GFR, Estimated: 30 mL/min — ABNORMAL LOW (ref >60)
Glucose, Bld: 101 mg/dL — ABNORMAL HIGH (ref 70–99)
Phosphorus: 3.4 mg/dL (ref 2.5–4.6)
Potassium: 3.9 mmol/L (ref 3.5–5.1)
Sodium: 135 mmol/L (ref 135–145)

## 2023-12-26 LAB — MAGNESIUM: Magnesium: 1.8 mg/dL (ref 1.7–2.4)

## 2023-12-26 MED ORDER — ROSUVASTATIN CALCIUM 10 MG PO TABS: 10.0000 mg | ORAL_TABLET | Freq: Every day | ORAL | Status: AC

## 2023-12-26 MED ORDER — ACETAMINOPHEN 325 MG PO TABS: ORAL_TABLET | ORAL | Status: AC

## 2023-12-26 MED ORDER — METOPROLOL SUCCINATE ER 25 MG PO TB24: 25.0000 mg | ORAL_TABLET | Freq: Every day | ORAL | Status: DC

## 2023-12-26 MED ORDER — METHOCARBAMOL 500 MG PO TABS: 500.0000 mg | ORAL_TABLET | Freq: Three times a day (TID) | ORAL | 0 refills | Status: DC | PRN

## 2023-12-26 MED ORDER — OXYCODONE HCL 5 MG PO TABS: 5.0000 mg | ORAL_TABLET | Freq: Three times a day (TID) | ORAL | 0 refills | Status: DC | PRN

## 2023-12-26 NOTE — Progress Notes (Signed)
SATURATION QUALIFICATIONS: (This note is used to comply with regulatory documentation for home oxygen) ° °Patient Saturations on Room Air at Rest = 99% ° °Patient Saturations on Room Air while Ambulating = 96% ° °

## 2023-12-26 NOTE — Progress Notes (Signed)
 Occupational Therapy Treatment Patient Details Name: Kerry Powell MRN: [id number] DOB: Jul 08, 1945 Today's Date: 12/26/2023   History of present illness Pt is 78 yo presenting to [facility] on 10/24 due to AMS and increased shortness of breath with exertion and increased O2 demands. PMH includes Recent IMN of the L femur on 8/28, DMII, CKD, HFpEF, COPD.   OT comments  Pt returning from bathroom with nurse upon arrival, dizzy and seeking to sit on bed as fast as possible. Symptoms improved upon sitting. Completed seated hand washing with set up. Returned to supine without physical assist with complete resolve of dizziness. Total assist to don compression socks in supine. Pt got up to [facility] with steadying assist. Cued to sit and then stand momentarily prior to transferring. Pt completed pericare in sitting with set up. Pt tearful. Reports hypotension has been an ongoing problem and very disabling to her. Patient will benefit from continued inpatient follow up therapy, <3 hours/day.          If plan is discharge home, recommend the following:  A little help with walking and/or transfers;A lot of help with bathing/dressing/bathroom;Assistance with cooking/housework;Assist for transportation;Help with stairs or ramp for entrance   Equipment Recommendations  Other (comment) (defer)    Recommendations for Other Services      Precautions / Restrictions Precautions Precautions: Fall Recall of Precautions/Restrictions: Intact Precaution/Restrictions Comments: watch BP Restrictions Weight Bearing Restrictions Per Provider Order: No       Mobility Bed Mobility Overal bed mobility: Needs Assistance Bed Mobility: Sit to Supine, Supine to Sit     Supine to sit: Contact guard Sit to supine: Supervision   General bed mobility comments: no physical assist    Transfers Overall transfer level: Needs assistance Equipment used: Rolling walker (2 wheels) Transfers: Sit to/from Stand, Bed to  chair/wheelchair/BSC Sit to Stand: Min assist           General transfer comment: transferred from bed to [facility] and back with min assist to steady     Balance Overall balance assessment: Needs assistance   Sitting balance-Leahy Scale: Fair     Standing balance support: Bilateral upper extremity supported, During functional activity, Reliant on assistive device for balance Standing balance-Leahy Scale: Poor                             ADL either performed or assessed with clinical judgement   ADL Overall ADL's : Needs assistance/impaired     Grooming: Wash/dry hands;Sitting;Set up               Lower Body Dressing: Total assistance;Bed level Lower Body Dressing Details (indicate cue type and reason): for compression socks Toilet Transfer: Stand-pivot;BSC/3in1;Rolling walker (2 wheels);Minimal assistance   Toileting- Clothing Manipulation and Hygiene: Set up;Sitting/lateral lean       Functional mobility during ADLs: Rolling walker (2 wheels);Minimal assistance;+2 for safety/equipment      Extremity/Trunk Assessment              Vision       Perception     Praxis     Communication Communication Communication: No apparent difficulties   Cognition Arousal: Alert Behavior During Therapy: WFL for tasks assessed/performed Cognition: No apparent impairments                               Following commands: Intact  Cueing   Cueing Techniques: Verbal cues  Exercises      Shoulder Instructions       General Comments      Pertinent Vitals/ Pain       Pain Assessment Pain Assessment: Faces Faces Pain Scale: Hurts little more Pain Location: L LE Pain Descriptors / Indicators: Grimacing, Guarding, Discomfort, Sharp Pain Intervention(s): Monitored during session, Repositioned  Home Living Family/patient expects to be discharged to:: Private residence Living Arrangements: Spouse/significant other;Children                                       Prior Functioning/Environment              Frequency  Min 2X/week        Progress Toward Goals  OT Goals(current goals can now be found in the care plan section)  Progress towards OT goals: Progressing toward goals  Acute Rehab OT Goals OT Goal Formulation: With patient Time For Goal Achievement: 01/06/24 Potential to Achieve Goals: Good  Plan      Co-evaluation                 AM-PAC OT 6 Clicks Daily Activity     Outcome Measure   Help from another person eating meals?: None Help from another person taking care of personal grooming?: A Little Help from another person toileting, which includes using toliet, bedpan, or urinal?: A Little Help from another person bathing (including washing, rinsing, drying)?: A Lot Help from another person to put on and taking off regular upper body clothing?: A Lot Help from another person to put on and taking off regular lower body clothing?: Total 6 Click Score: 15    End of Session Equipment Utilized During Treatment: Rolling walker (2 wheels);Gait belt  OT Visit Diagnosis: Unsteadiness on feet (R26.81);Other abnormalities of gait and mobility (R26.89);Muscle weakness (generalized) (M62.81);Dizziness and giddiness (R42)   Activity Tolerance Treatment limited secondary to medical complications (Comment) (dizziness)   Patient Left in bed;with call bell/phone within reach;with bed alarm set   Nurse Communication          Time: 8846-8773 OT Time Calculation (min): 33 min  Charges: OT General Charges $OT Visit: 1 Visit OT Treatments $Self Care/Home Management : 23-37 mins  Kerry Powell, OTR/L Acute Rehabilitation Services Office: [phone]   Kerry Powell 12/26/2023, 1:27 PM

## 2023-12-26 NOTE — Progress Notes (Signed)
 Mobility Specialist Progress Note:    12/26/23 1042  Mobility  Activity Pivoted/transferred from bed to chair  Level of Assistance Minimal assist, patient does 75% or more  Assistive Device Front wheel walker  Distance Ambulated (ft) 5 ft  Activity Response Tolerated well  Mobility Referral Yes  Mobility visit 1 Mobility  Mobility Specialist Start Time (ACUTE ONLY) 1016  Mobility Specialist Stop Time (ACUTE ONLY) 1026  Mobility Specialist Time Calculation (min) (ACUTE ONLY) 10 min   Received pt ambulating w/ RN and assisted RN w/ chair for pt to have x1 seated rest break d/t dizziness. Pt rolled back safely. Left pt on 2 L/min O2. Left pt in chair with personal belongings and call light within reach. All needs met.  Lavanda Pollack Mobility Specialist  Please contact via [employer] or  Rehab Office [phone]

## 2023-12-26 NOTE — TOC Progression Note (Addendum)
 Transition of Care [facility]) - Progression Note    Patient Details  Name: Kerry Powell MRN: [id number] Date of Birth: 03/26/1945  Transition of Care [facility]) CM/SW Contact  Luann SHAUNNA [place], [name] Phone Number: 12/26/2023, 10:06 AM  Clinical Narrative:     SNF auth request initiated in online portal. Ref# [id number] Auth is pending for [employer] [place]  PASRR pending  Expected Discharge Plan: Skilled Nursing Facility Barriers to Discharge: Insurance Authorization,                Expected Discharge Plan and Services In-house Referral: Clinical Social Work   Post Acute Care Choice: Skilled Nursing Facility Living arrangements for the past 2 months: Single Family Home                                       Social Drivers of Health (SDOH) Interventions SDOH Screenings   Food Insecurity: No Food Insecurity (12/21/2023)  Housing: Low Risk  (12/21/2023)  Transportation Needs: No Transportation Needs (12/21/2023)  Utilities: Not At Risk (12/21/2023)  Alcohol  Screen: Low Risk  (05/15/2023)  Depression (PHQ2-9): Low Risk  (11/20/2023)  Financial Resource Strain: Low Risk  (12/08/2023)  Physical Activity: Inactive (12/08/2023)  Social Connections: Unknown (12/24/2023)  Recent Concern: Social Connections - Socially Isolated (12/21/2023)  Stress: Stress Concern Present (12/08/2023)  Tobacco Use: Medium Risk (12/23/2023)  Health Literacy: Adequate Health Literacy (05/15/2023)    Readmission Risk Interventions     No data to display

## 2023-12-26 NOTE — TOC Progression Note (Signed)
 Transition of Care [facility]) - Progression Note    Patient Details  Name: Kerry Powell MRN: [id number] Date of Birth: 06-08-1945  Transition of Care [facility]) CM/SW Contact  Luann SHAUNNA [place], [name] Phone Number: 12/26/2023, 3:05 PM  Clinical Narrative:     SNF auth approved 10/30- 11/3. Auth# [id number].   PASRR is still pending. Pt cannot admit to SNF until PASRR is received.   CSW updated Randine with [employer]  Expected Discharge Plan: Skilled Nursing Facility Barriers to Discharge: Awaiting State Approval (PASRR)               Expected Discharge Plan and Services In-house Referral: Clinical Social Work   Post Acute Care Choice: Skilled Nursing Facility Living arrangements for the past 2 months: Single Family Home Expected Discharge Date: 12/26/23                                     Social Drivers of Health (SDOH) Interventions SDOH Screenings   Food Insecurity: No Food Insecurity (12/21/2023)  Housing: Low Risk  (12/21/2023)  Transportation Needs: No Transportation Needs (12/21/2023)  Utilities: Not At Risk (12/21/2023)  Alcohol  Screen: Low Risk  (05/15/2023)  Depression (PHQ2-9): Low Risk  (11/20/2023)  Financial Resource Strain: Low Risk  (12/08/2023)  Physical Activity: Inactive (12/08/2023)  Social Connections: Unknown (12/24/2023)  Recent Concern: Social Connections - Socially Isolated (12/21/2023)  Stress: Stress Concern Present (12/08/2023)  Tobacco Use: Medium Risk (12/23/2023)  Health Literacy: Adequate Health Literacy (05/15/2023)    Readmission Risk Interventions     No data to display

## 2023-12-26 NOTE — Progress Notes (Signed)
   12/26/23 1337 12/26/23 1338 12/26/23 1340  Vitals  BP 127/85 91/62 (!) 141/127  MAP (mmHg)  --  73 134  BP Location Right Arm Right Arm  --   BP Method Automatic Automatic Automatic  Patient Position (if appropriate) Lying Sitting  --   Pulse Rate 98 (!) 103 (!) 185  MEWS COLOR  MEWS Score Color Green Yellow Yellow    12/26/23 1342  Vitals  BP (!) 117/95  MAP (mmHg) 102  BP Location Right Arm  BP Method Automatic  Patient Position (if appropriate) Standing  Pulse Rate (!) 125  MEWS COLOR  MEWS Score Color Yellow

## 2023-12-26 NOTE — Plan of Care (Signed)

## 2023-12-26 NOTE — Progress Notes (Signed)
 PROGRESS NOTE  Kerry Powell FMW:[id number] DOB: 11-18-1945   PCP: Geofm Glade PARAS, MD  Patient is from: Home  DOA: 12/20/2023 LOS: 6  Chief complaints Chief Complaint  Patient presents with   Shortness of Breath     Brief Narrative / Interim history: 78 y.o. 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    CXR with hyperinflation and emphysema.  Pelvic x-ray without acute finding.  Left tibial/fibular x-ray with septal cortical defect along the anterior aspect of the lateral malleolus on the lateral view only, and osteopenia.  Left ankle x-ray without acute finding.  CT head without acute finding.  Left femoral x-ray without acute finding or hardware complication but mild degenerative changes of medial compartment of the knee.  Lumbar film with multilevel degenerative change. D-dimer was elevated but VQ scan was negative for PE.  Lower extremity venous Doppler was negative for DVT.  Patient was treated for COPD exacerbation with nebulizers and inhalers with improvement in her oxygen requirement and breathing.  While in hospital, patient had a UA with large pyuria and microscopic hematuria, and started on IV ceftriaxone  for possible UTI.  Urine culture NGTD.  Therapy recommended SNF.  Medically stable for discharge.  Subjective: Seen and examined earlier this morning.  No major events overnight or this morning.  Reports intermittent spasms/pain in her left leg that improves with messaging.    Assessment and plan: Acute on chronic hypoxic respiratory failure- worsening shortness of breath, tachycardia, increased oxygen requirement.   DVT ruled out by negative VQ scan and lower extremity venous Doppler.  CXR suggested hyperinflation/emphysema.  Ambulated to room air and maintained appropriate saturation. -Continue inhalers-triple therapy with as needed DuoNebs. -Minimum oxygen to keep saturation above 90% or less of on 2 L.  COPD exacerbation?  Presented with shortness of breath.  Did not require steroid -Continue inhalers as above.   Recurrent fall at home-trauma survey negative as above Left lower extremity pain-persistent pain since left femoral fracture for which she had IM nailing on 7/28.  She has tenderness over left trochanter but difficult to tell if the tenderness is due to instrumentation -Scheduled Tylenol  with as needed Robaxin , tramadol  and oxycodone  -Added Voltaren gel as well.   Possible UTI-patient had some confusion and lower abdominal pain.  UA obtained and showed pyuria with hematuria.  Started on ceftriaxone .  Urine culture NGTD -Continue short course of ceftriaxone  for 3 days   AKI on CKD-3B: Cr 2.18 (was 1.32 on 9/26).  Slowly improving.  Suspect dehydration from Lasix  and Farxiga . Recent Labs    10/27/23 0721 10/28/23 0648 10/31/23 0457 11/22/23 1525 12/20/23 1957 12/21/23 0430 12/22/23 0414 12/23/23 1103 12/24/23 0249 12/25/23 0306 12/26/23 0355  BUN 27* 23  --  8 16 13 11 11 13 17  25*  CREATININE 1.44* 1.42* 1.45* 1.32* 2.18* 1.94* 1.77* 2.05* 2.09* 1.95* 1.72*  - Avoid nephrotoxic meds - Monitor off IV fluid.   Chronic HFpEF: TTE in 09/2023 with LVEF of 60 to 65%, G1 DD and normal RV SF.  Appears euvolemic on exam. -Continue holding home Lasix  and Farxiga . - Monitor fluid and respiratory status.  History of CAD/PAD: Stable. -  Continue home Toprol -XL, aspirin, Plavix  and statin.  Delirium reportedly had confusion and hallucination likely from pain meds.  No formal diagnosis of cognitive impairment.  Fairly oriented. -Minimize sedating medications -Delirium precaution -Outpatient  follow-up with neurology for formal evaluation  Controlled NIDDM-2: Last A1c 6.4% on 8/11.  On Farxiga  at home.  CBG within normal range for most part. -Discontinue CBG monitoring and SSI.  Essential hypertension: Felt dizzy during ambulation for saturation assessment.  Orthostatic vitals nondiagnostic.  BP dropped from lying to sitting but increased with standing. -Continue Toprol -XL. - Compression socks - OOB, elevate head of bed  Hyperlipidemia -continue statin therapy.    Body mass index is 29.57 kg/m.           DVT prophylaxis:  Place TED hose Start: 12/26/23 1209 heparin  injection 5,000 Units Start: 12/21/23 0600  Code Status: DNR Family Communication: None at bedside Level of care: Med-Surg Status is: Inpatient Remains inpatient appropriate because: Respiratory failure, left leg pain and recurrent fall   Final disposition: SNF   35 minutes with more than 50% spent in reviewing records, counseling patient/family and coordinating care.  Consultants:  None  Procedures: None  Microbiology summarized: Blood cultures NGTD Urine cultures NGTD MRSA PCR screen nonreactive COVID-19, influenza and RSV PCR nonreactive  Objective: Vitals:   12/26/23 1337 12/26/23 1338 12/26/23 1340 12/26/23 1342  BP: 127/85 91/62 (!) 141/127 (!) 117/95  Pulse: 98 (!) 103 (!) 185 (!) 125  Resp:      Temp:      TempSrc:      SpO2:      Weight:      Height:        Examination:  GENERAL: No apparent distress.  Nontoxic. HEENT: MMM.  Vision and hearing grossly intact.  NECK: Supple.  No apparent JVD.  RESP:  No IWOB.  Fair aeration bilaterally. CVS:  RRR. Heart sounds normal.  ABD/GI/GU: BS+. Abd soft, NTND.  MSK/EXT:  Moves extremities. Subtotal weakness in left leg.  Tenderness over left greater trochanter.  No edema.  SKIN: no apparent skin lesion or wound NEURO: AA.  Oriented appropriately.  Subtle weakness in left leg.  PSYCH: Calm. Normal affect.   Sch Meds:   Scheduled Meds:  acetaminophen   975 mg Oral Q6H WA   allopurinol   300 mg Oral Daily   aspirin EC  81 mg Oral Daily   clopidogrel   75 mg Oral Daily   diclofenac Sodium  2 g Topical QID   DULoxetine   60 mg Oral Daily   fluticasone  furoate-vilanterol  1 puff Inhalation Daily   heparin  injection (subcutaneous)  5,000 Units Subcutaneous Q8H   metoprolol  succinate  25 mg Oral Daily   potassium chloride  SA  20 mEq Oral Daily   rosuvastatin   10 mg Oral Daily   sodium chloride  flush  3 mL Intravenous Q12H   umeclidinium bromide   1 puff Inhalation Daily   Continuous Infusions:  cefTRIAXone  (ROCEPHIN )  IV 2 g (12/26/23 0911)   PRN Meds:.alum & mag hydroxide-simeth, guaiFENesin -dextromethorphan, ipratropium-albuterol , melatonin, methocarbamol , ondansetron  (ZOFRAN ) IV, oxyCODONE , polyethylene glycol, traMADol   Antimicrobials: Anti-infectives (From admission, onward)    Start     Dose/Rate Route Frequency Ordered Stop   12/23/23 1000  cefTRIAXone  (ROCEPHIN ) 2 g in sodium chloride  0.9 % 100 mL IVPB        2 g 200 mL/hr over 30 Minutes Intravenous Every 24 hours 12/23/23 0843          I have personally reviewed the following  labs and images: CBC: Recent Labs  Lab 12/20/23 1957 12/21/23 0430 12/26/23 0355  WBC 8.4 9.3 8.9  HGB 14.2 13.0 13.2  HCT 46.1* 41.4 41.2  MCV 95.4 95.4 93.6  PLT 279 233 239   BMP &GFR Recent Labs  Lab 12/21/23 0430 12/22/23 0414 12/23/23 1103 12/24/23 0249 12/25/23 0306 12/26/23 0355  NA 135 138 138 133* 133* 135  K 3.7 3.6 3.7 3.7 3.8 3.9  CL 101 104 97* 97* 94* 100  CO2 21* 23 27 27 27 23   GLUCOSE 92 90 112* 96 111* 101*  BUN 13 11 11 13 17  25*  CREATININE 1.94* 1.77* 2.05* 2.09* 1.95* 1.72*  CALCIUM  9.3 9.6 9.7 9.3 10.0 9.5  MG 1.7  --   --   --   --  1.8  PHOS 3.9  --   --   --   --  3.4   Estimated Creatinine Clearance: 28.3 mL/min (A) (by C-G formula based on SCr of 1.72 mg/dL (H)). Liver & Pancreas: Recent Labs  Lab 12/20/23 1957  12/26/23 0355  AST 22  --   ALT 11  --   ALKPHOS 136*  --   BILITOT 0.9  --   PROT 7.2  --   ALBUMIN  3.4* 3.0*   No results for input(s): LIPASE, AMYLASE in the last 168 hours. No results for input(s): AMMONIA in the last 168 hours. Diabetic: No results for input(s): HGBA1C in the last 72 hours. Recent Labs  Lab 12/24/23 1101 12/24/23 1603 12/24/23 2105 12/25/23 0553 12/25/23 1126  GLUCAP 99 109* 75 110* 94   Cardiac Enzymes: No results for input(s): CKTOTAL, CKMB, CKMBINDEX, TROPONINI in the last 168 hours. No results for input(s): PROBNP in the last 8760 hours. Coagulation Profile: Recent Labs  Lab 12/20/23 2055  INR 1.0   Thyroid  Function Tests: No results for input(s): TSH, T4TOTAL, FREET4, T3FREE, THYROIDAB in the last 72 hours. Lipid Profile: No results for input(s): CHOL, HDL, LDLCALC, TRIG, CHOLHDL, LDLDIRECT in the last 72 hours. Anemia Panel: No results for input(s): VITAMINB12, FOLATE, FERRITIN, TIBC, IRON, RETICCTPCT in the last 72 hours. Urine analysis:    Component Value Date/Time   COLORURINE YELLOW 12/22/2023 1339   APPEARANCEUR TURBID (A) 12/22/2023 1339   LABSPEC 1.010 12/22/2023 1339   PHURINE 6.0 12/22/2023 1339   GLUCOSEU >=500 (A) 12/22/2023 1339   GLUCOSEU >=1000 (A) 05/15/2022 1554   HGBUR MODERATE (A) 12/22/2023 1339   BILIRUBINUR NEGATIVE 12/22/2023 1339   BILIRUBINUR negative 09/25/2022 1027   KETONESUR NEGATIVE 12/22/2023 1339   PROTEINUR 100 (A) 12/22/2023 1339   UROBILINOGEN 0.2 09/25/2022 1027   UROBILINOGEN 0.2 05/15/2022 1554   NITRITE NEGATIVE 12/22/2023 1339   LEUKOCYTESUR LARGE (A) 12/22/2023 1339   Sepsis Labs: Invalid input(s): PROCALCITONIN, LACTICIDVEN  Microbiology: Recent Results (from the past 240 hours)  Resp panel by RT-PCR (RSV, Flu A&B, Covid) Anterior Nasal Swab     Status: None   Collection Time: 12/20/23  3:58 PM   Specimen: Anterior Nasal Swab   Result Value Ref Range Status   SARS Coronavirus 2 by RT PCR NEGATIVE NEGATIVE Final   Influenza A by PCR NEGATIVE NEGATIVE Final   Influenza B by PCR NEGATIVE NEGATIVE Final    Comment: (NOTE) The Xpert Xpress SARS-CoV-2/FLU/RSV plus assay is intended as an aid in the diagnosis of influenza from Nasopharyngeal swab specimens and should not be used as a sole basis for treatment. Nasal washings and aspirates are unacceptable for Xpert Xpress SARS-CoV-2/FLU/RSV  testing.  Fact Sheet for Patients: bloggercourse.com  Fact Sheet for Healthcare Providers: seriousbroker.it  This test is not yet approved or cleared by the [place]  FDA and has been authorized for detection and/or diagnosis of SARS-CoV-2 by FDA under an Emergency Use Authorization (EUA). This EUA will remain in effect (meaning this test can be used) for the duration of the COVID-19 declaration under Section 564(b)(1) of the Act, 21 U.S.C. section 360bbb-3(b)(1), unless the authorization is terminated or revoked.     Resp Syncytial Virus by PCR NEGATIVE NEGATIVE Final    Comment: (NOTE) Fact Sheet for Patients: bloggercourse.com  Fact Sheet for Healthcare Providers: seriousbroker.it  This test is not yet approved or cleared by the [place]  FDA and has been authorized for detection and/or diagnosis of SARS-CoV-2 by FDA under an Emergency Use Authorization (EUA). This EUA will remain in effect (meaning this test can be used) for the duration of the COVID-19 declaration under Section 564(b)(1) of the Act, 21 U.S.C. section 360bbb-3(b)(1), unless the authorization is terminated or revoked.  Performed at [facility], [address] [place], [name] [address]   Culture, blood (routine x 2)     Status: None   Collection Time: 12/20/23  7:57 PM   Specimen: BLOOD  Result Value Ref Range Status   Specimen  Description BLOOD BLOOD RIGHT ARM  Final   Special Requests   Final    AEROBIC BOTTLE ONLY Blood Culture results may not be optimal due to an inadequate volume of blood received in culture bottles   Culture   Final    NO GROWTH 5 DAYS Performed at [facility], [address], [place], [name] [address]    Report Status 12/25/2023 FINAL  Final  Culture, blood (routine x 2)     Status: None   Collection Time: 12/20/23  7:57 PM   Specimen: BLOOD  Result Value Ref Range Status   Specimen Description BLOOD BLOOD LEFT ARM  Final   Special Requests   Final    BOTTLES DRAWN AEROBIC AND ANAEROBIC Blood Culture adequate volume   Culture   Final    NO GROWTH 5 DAYS Performed at [facility], [address], [place], [name] [address]    Report Status 12/25/2023 FINAL  Final  MRSA Next Gen by PCR, Nasal     Status: None   Collection Time: 12/21/23  8:40 AM   Specimen: Nasal Mucosa; Nasal Swab  Result Value Ref Range Status   MRSA by PCR Next Gen NOT DETECTED NOT DETECTED Final    Comment: (NOTE) The GeneXpert MRSA Assay (FDA approved for NASAL specimens only), is one component of a comprehensive MRSA colonization surveillance program. It is not intended to diagnose MRSA infection nor to guide or monitor treatment for MRSA infections. Test performance is not FDA approved in patients less than 2 years old. Performed at [facility], [address] [place], [name] [address]   Urine Culture (for pregnant, neutropenic or urologic patients or patients with an indwelling urinary catheter)     Status: None   Collection Time: 12/23/23 11:07 AM   Specimen: Urine, Clean Catch  Result Value Ref Range Status   Specimen Description URINE, CLEAN CATCH  Final   Special Requests NONE  Final   Culture   Final    NO GROWTH Performed at [facility], [address], [place], [name] [address]    Report Status 12/24/2023 FINAL  Final  Radiology Studies: No results  found.    [place] T. Alexandrina Fiorini [facility]  If 7PM-7AM, please contact night-coverage [email or web address] 12/26/2023, 2:23 PM

## 2023-12-27 DIAGNOSIS — J9601 Acute respiratory failure with hypoxia: Secondary | ICD-10-CM | POA: Diagnosis not present

## 2023-12-27 DIAGNOSIS — M79605 Pain in left leg: Secondary | ICD-10-CM | POA: Diagnosis not present

## 2023-12-27 DIAGNOSIS — R296 Repeated falls: Secondary | ICD-10-CM | POA: Diagnosis not present

## 2023-12-27 LAB — RENAL FUNCTION PANEL
Albumin: 3.1 g/dL — ABNORMAL LOW (ref 3.5–5.0)
Anion gap: 9 (ref 5–15)
BUN: 24 mg/dL — ABNORMAL HIGH (ref 8–23)
CO2: 25 mmol/L (ref 22–32)
Calcium: 9.3 mg/dL (ref 8.9–10.3)
Chloride: 100 mmol/L (ref 98–111)
Creatinine, Ser: 1.57 mg/dL — ABNORMAL HIGH (ref 0.44–1.00)
GFR, Estimated: 34 mL/min — ABNORMAL LOW (ref >60)
Glucose, Bld: 102 mg/dL — ABNORMAL HIGH (ref 70–99)
Phosphorus: 3 mg/dL (ref 2.5–4.6)
Potassium: 3.9 mmol/L (ref 3.5–5.1)
Sodium: 134 mmol/L — ABNORMAL LOW (ref 135–145)

## 2023-12-27 MED ORDER — OXYCODONE HCL 5 MG PO TABS: 2.5000 mg | ORAL_TABLET | Freq: Three times a day (TID) | ORAL | Status: DC | PRN

## 2023-12-27 MED ORDER — TRAMADOL HCL 50 MG PO TABS: 50.0000 mg | ORAL_TABLET | Freq: Two times a day (BID) | ORAL | 0 refills | Status: AC | PRN

## 2023-12-27 NOTE — Progress Notes (Signed)
 Physical Therapy Treatment Patient Details Name: Kerry Powell MRN: [id number] DOB: 03-07-1945 Today's Date: 12/27/2023   History of Present Illness Pt is 78 yo presenting to [facility] on 10/24 due to AMS and increased shortness of breath with exertion and increased O2 demands. PMH includes Recent IMN of the L femur on 8/28, DMII, CKD, HFpEF, COPD.    PT Comments  Pt seated up on EOB on arrival, eager for mobility, however continues to be limited in safe mobility by symptomatic orthostatic hypotension, see below and vitals tab for BP readings. Pt requiring light min A to complete transfers sit<>stand and min A to maintain balance to side step along EOB to HOB. Pt unable to progress gait away from EOB this session due to dizziness and lightheadedness. Pt declining donning ted hose during session despite education on purpose and importance. Pt will to attempt abdominal binder, RN and MD updated and order placed. Pt performing seated LE therex for increased ROM and strength maintenance and verbalizing understanding of completion between therapies as well as importance of time up OOB. Pt continues to benefit from skilled PT services to progress toward functional mobility goals.     If plan is discharge home, recommend the following: Assist for transportation;Help with stairs or ramp for entrance;Assistance with cooking/housework   Can travel by private vehicle     Yes  Equipment Recommendations  None recommended by PT    Recommendations for Other Services       Precautions / Restrictions Precautions Precautions: Fall Recall of Precautions/Restrictions: Intact Precaution/Restrictions Comments: watch BP Restrictions Weight Bearing Restrictions Per Provider Order: No     Mobility  Bed Mobility Overal bed mobility: Needs Assistance             General bed mobility comments: pt seated up EOB pn arrival    Transfers Overall transfer level: Needs assistance Equipment used: Rolling walker  (2 wheels) Transfers: Sit to/from Stand, Bed to chair/wheelchair/BSC Sit to Stand: Min assist           General transfer comment: min A to steady on rise to stand    Ambulation/Gait Ambulation/Gait assistance: Contact guard assist Gait Distance (Feet): 3 Feet Assistive device: Rolling walker (2 wheels) Gait Pattern/deviations: Step-through pattern, Decreased stance time - left, Antalgic, Decreased stride length, Trendelenburg Gait velocity: decreased     General Gait Details: unable to progress today due to BP pt reporting dizziness in standing. BP 80/55 after standing ~38mins, able to take side steps along EOB to [facility]   Stairs             Wheelchair Mobility     Tilt Bed    Modified Rankin (Stroke Patients Only)       Balance Overall balance assessment: Needs assistance Sitting-balance support: Feet supported, No upper extremity supported Sitting balance-Leahy Scale: Fair     Standing balance support: Bilateral upper extremity supported, During functional activity, Reliant on assistive device for balance Standing balance-Leahy Scale: Poor                              Communication Communication Communication: No apparent difficulties  Cognition Arousal: Alert Behavior During Therapy: WFL for tasks assessed/performed   PT - Cognitive impairments: Awareness, Memory                         Following commands: Intact      Cueing Cueing Techniques: Verbal cues  Exercises General Exercises - Lower Extremity Ankle Circles/Pumps: AROM, Both, 10 reps, Seated Long Arc Quad: AROM, Right, Left, 10 reps, Seated Hip Flexion/Marching: AROM, Right, Left, 10 reps, Seated    General Comments General comments (skin integrity, edema, etc.): BP seated on EOB 95/81, standing 124/110, standing at 80/55, coming up to 89/77 once seated back on EOB. pt reporting feeling dizzy and heavy      Pertinent Vitals/Pain Pain Assessment Pain  Assessment: Faces Faces Pain Scale: Hurts a little bit Pain Location: L LE Pain Descriptors / Indicators: Grimacing, Guarding, Discomfort, Sharp Pain Intervention(s): Monitored during session, Limited activity within patient's tolerance    Home Living                          Prior Function            PT Goals (current goals can now be found in the care plan section) Acute Rehab PT Goals PT Goal Formulation: With patient Time For Goal Achievement: 01/05/24 Progress towards PT goals: Not progressing toward goals - comment (orthostatic hypotension)    Frequency    Min 2X/week      PT Plan      Co-evaluation              AM-PAC PT 6 Clicks Mobility   Outcome Measure  Help needed turning from your back to your side while in a flat bed without using bedrails?: A Little Help needed moving from lying on your back to sitting on the side of a flat bed without using bedrails?: A Little Help needed moving to and from a bed to a chair (including a wheelchair)?: A Little Help needed standing up from a chair using your arms (e.g., wheelchair or bedside chair)?: A Little Help needed to walk in hospital room?: A Little Help needed climbing 3-5 steps with a railing? : A Lot 6 Click Score: 17    End of Session   Activity Tolerance: Patient tolerated treatment well;Patient limited by fatigue Patient left: in chair;with call bell/phone within reach;Other (comment) (seated up EOB) Nurse Communication: Mobility status;Other (comment) (pt would benefit from abdominal binder) PT Visit Diagnosis: Unsteadiness on feet (R26.81);Other abnormalities of gait and mobility (R26.89);Pain;Muscle weakness (generalized) (M62.81) Pain - Right/Left: Left Pain - part of body: Hip;Knee     Time: 8970-8947 PT Time Calculation (min) (ACUTE ONLY): 23 min  Charges:    $Therapeutic Activity: 23-37 mins PT General Charges $$ ACUTE PT VISIT: 1 Visit                     Dago Jungwirth R.  PTA Acute Rehabilitation Services Office: [phone]   Therisa CHRISTELLA Boor 12/27/2023, 12:21 PM

## 2023-12-27 NOTE — TOC Progression Note (Signed)
 Transition of Care [facility]) - Progression Note    Patient Details  Name: Kerry Powell MRN: [id number] Date of Birth: [birth year]  Transition [facility]) CM/SW Contact  Bridget Cordella Simmonds, LCSW Phone Number: 12/27/2023, 11:51 AM  Clinical Narrative:   PASSR remains pending.  Appears to be needing FF visit for PASSR.     Expected Discharge Plan: Skilled Nursing Facility Barriers to Discharge: Awaiting State Approval (PASRR)               Expected Discharge Plan and Services In-house Referral: Clinical Social Work   Post Acute Care Choice: Skilled Nursing Facility Living arrangements for the past 2 months: Single Family Home Expected Discharge Date: 12/26/23                                     Social Drivers of Health (SDOH) Interventions SDOH Screenings   Food Insecurity: No Food Insecurity (12/21/2023)  Housing: Low Risk  (12/21/2023)  Transportation Needs: No Transportation Needs (12/21/2023)  Utilities: Not At Risk (12/21/2023)  Alcohol  Screen: Low Risk  (05/15/2023)  Depression (PHQ2-9): Low Risk  (11/20/2023)  Financial Resource Strain: Low Risk  (12/08/2023)  Physical Activity: Inactive (12/08/2023)  Social Connections: Unknown (12/24/2023)  Recent Concern: Social Connections - Socially Isolated (12/21/2023)  Stress: Stress Concern Present (12/08/2023)  Tobacco Use: Medium Risk (12/23/2023)  Health Literacy: Adequate Health Literacy (05/15/2023)    Readmission Risk Interventions     No data to display

## 2023-12-27 NOTE — Progress Notes (Signed)
 PROGRESS NOTE  Kerry Powell FMW:[id number] DOB: 13-Apr-1945   PCP: Geofm Glade PARAS, MD  Patient is from: Home  DOA: 12/20/2023 LOS: 7  Chief complaints Chief Complaint  Patient presents with   Shortness of Breath     Brief Narrative / Interim history: 78 y.o. 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    CXR with hyperinflation and emphysema.  Pelvic x-ray without acute finding.  Left tibial/fibular x-ray with septal cortical defect along the anterior aspect of the lateral malleolus on the lateral view only, and osteopenia.  Left ankle x-ray without acute finding.  CT head without acute finding.  Left femoral x-ray without acute finding or hardware complication but mild degenerative changes of medial compartment of the knee.  Lumbar film with multilevel degenerative change. D-dimer was elevated but VQ scan was negative for PE.  Lower extremity venous Doppler was negative for DVT.  Patient was treated for COPD exacerbation with nebulizers and inhalers with improvement in her oxygen requirement and breathing.  While in hospital, patient had a UA with large pyuria and microscopic hematuria, and started on IV ceftriaxone  for possible UTI.  Urine culture NGTD.  Completed 5 days of ceftriaxone  empirically.  Hospital course complicated by orthostatic hypotension with normal to high supine blood pressure.  Started compression wears and decreased pain meds.  Therapy recommended SNF.  Medically stable for discharge.  Subjective: Seen and examined earlier this morning.  No major events overnight or this morning.  Continues to endorse left hip  and thigh pain that she describes as spasm.    Assessment and plan: Acute on chronic hypoxic respiratory failure- worsening shortness of breath, tachycardia, increased oxygen requirement.  DVT ruled out by negative VQ scan and lower extremity venous Doppler.  CXR suggested hyperinflation/emphysema.  Ambulated to room air and maintained appropriate saturation. -Continue inhalers-triple therapy with as needed DuoNebs. -Minimum oxygen to keep saturation above 90% or less of on 2 L.  COPD exacerbation?  Presented with shortness of breath.  Did not require steroid -Continue inhalers as above.   Recurrent fall at home-trauma survey negative as above Left lower extremity pain-persistent pain since left femoral fracture for which she had IM nailing on 7/28.  She has tenderness over left trochanter but difficult to tell if the tenderness is due to instrumentation -Scheduled Tylenol  with as needed tramadol  -Discontinued Robaxin .  Decreased oxycodone . -Continue Voltaren gel as well.  Orthostatic hypotension/history of essential hypertension: Orthostatic vitals positive.  Symptomatic. -Compression socks and abdominal binder -Not a candidate for midodrine given normal to elevated supine BP -Discontinue Robaxin  and decreased oxycodone . -OOB, elevate head of bed   Possible UTI-patient had some confusion and lower abdominal pain.  UA obtained and showed pyuria with hematuria.  Started on ceftriaxone .  Urine culture NGTD - Completed 5 days of ceftriaxone  on 10/31.   AKI on CKD-3B: Cr 2.18 (was 1.32 on 9/26).  Slowly improving.  Suspect dehydration from Lasix  and Farxiga .  AKI resolving. - Avoid nephrotoxic meds - Avoid nephrotoxic meds.   Chronic HFpEF: TTE in 09/2023 with LVEF of 60 to 65%, G1 DD and normal RV SF.  Appears euvolemic on exam. -Continue  holding home Lasix  and Farxiga . -Monitor fluid and respiratory status.  History of CAD/PAD: Stable. -Continue home Toprol -XL, aspirin, Plavix  and  statin.  Delirium reportedly had confusion and hallucination likely from pain meds.  No formal diagnosis of cognitive impairment.  Fairly oriented. -Minimize sedating medications -Outpatient follow-up with neurology for formal evaluation  Controlled NIDDM-2: Last A1c 6.4% on 8/11.  On Farxiga  at home.  CBG within normal range for most part. -Discontinue CBG monitoring and SSI.  Ecchymosis: Skin process across lower abdomen and also on left thigh from prophylactic heparin  injection. - Discontinue subcu heparin .  She is on Plavix  and aspirin which would be sufficient for VTE prophylaxis.  Hyperlipidemia -continue statin therapy.    Body mass index is 29.57 kg/m.           DVT prophylaxis:  Place TED hose Start: 12/26/23 1209  Code Status: DNR Family Communication: None at bedside Level of care: Med-Surg Status is: Inpatient Remains inpatient appropriate because: Orthostatic hypotension   Final disposition: SNF   35 minutes with more than 50% spent in reviewing records, counseling patient/family and coordinating care.  Consultants:  None  Procedures: None  Microbiology summarized: Blood cultures NGTD Urine cultures NGTD MRSA PCR screen nonreactive COVID-19, influenza and RSV PCR nonreactive  Objective: Vitals:   12/26/23 2019 12/27/23 0413 12/27/23 0820 12/27/23 1024  BP: 137/74 (!) 134/92 111/75   Pulse: 100 95 (!) 108 98  Resp: 18 18 20    Temp: 97.6 F (36.4 C) 97.6 F (36.4 C) 98.2 F (36.8 C)   TempSrc:  Oral Oral   SpO2: 92% 92% 92%   Weight:      Height:        Examination:  GENERAL: No apparent distress.  Nontoxic. HEENT: MMM.  Vision and hearing grossly intact.  NECK: Supple.  No apparent JVD.  RESP:  No IWOB.  Fair aeration bilaterally. CVS:  RRR. Heart sounds normal.  ABD/GI/GU: BS+. Abd soft, NTND.  MSK/EXT:  Moves extremities. Subtotal weakness in left leg.   SKIN: Bruising/ecchymosis across lower abdomen mainly on the left.   Bruising over left thigh. NEURO: AA.  Oriented appropriately.  Subtle weakness in left leg.  PSYCH: Calm. Normal affect.   Sch Meds:  Scheduled Meds:  acetaminophen   975 mg Oral Q6H WA   allopurinol   300 mg Oral Daily   aspirin EC  81 mg Oral Daily   clopidogrel   75 mg Oral Daily   diclofenac Sodium  2 g Topical QID   DULoxetine   60 mg Oral Daily   fluticasone  furoate-vilanterol  1 puff Inhalation Daily   metoprolol  succinate  25 mg Oral Daily   potassium chloride  SA  20 mEq Oral Daily   rosuvastatin   10 mg Oral Daily   sodium chloride  flush  3 mL Intravenous Q12H   umeclidinium bromide   1 puff Inhalation Daily   Continuous Infusions:  cefTRIAXone  (ROCEPHIN )  IV 2 g (12/27/23 0829)   PRN Meds:.alum & mag hydroxide-simeth, guaiFENesin -dextromethorphan, ipratropium-albuterol , melatonin, ondansetron  (ZOFRAN ) IV, oxyCODONE , polyethylene glycol, traMADol   Antimicrobials: Anti-infectives (From admission, onward)    Start     Dose/Rate Route Frequency Ordered Stop   12/23/23 1000  cefTRIAXone  (ROCEPHIN ) 2 g in sodium chloride  0.9 % 100 mL IVPB        2 g 200 mL/hr over 30 Minutes Intravenous Every 24 hours 12/23/23 0843          I have personally reviewed the following labs and images: CBC: Recent Labs  Lab  12/20/23 1957 12/21/23 0430 12/26/23 0355  WBC 8.4 9.3 8.9  HGB 14.2 13.0 13.2  HCT 46.1* 41.4 41.2  MCV 95.4 95.4 93.6  PLT 279 233 239   BMP &GFR Recent Labs  Lab 12/21/23 0430 12/22/23 0414 12/23/23 1103 12/24/23 0249 12/25/23 0306 12/26/23 0355 12/27/23 0416  NA 135   < > 138 133* 133* 135 134*  K 3.7   < > 3.7 3.7 3.8 3.9 3.9  CL 101   < > 97* 97* 94* 100 100  CO2 21*   < > 27 27 27 23 25   GLUCOSE 92   < > 112* 96 111* 101* 102*  BUN 13   < > 11 13 17  25* 24*  CREATININE 1.94*   < > 2.05* 2.09* 1.95* 1.72* 1.57*  CALCIUM  9.3   < > 9.7 9.3 10.0 9.5 9.3  MG 1.7  --   --   --   --  1.8  --   PHOS 3.9  --   --   --   --  3.4 3.0   < > = values in this  interval not displayed.   Estimated Creatinine Clearance: 31 mL/min (A) (by C-G formula based on SCr of 1.57 mg/dL (H)). Liver & Pancreas: Recent Labs  Lab 12/20/23 1957 12/26/23 0355 12/27/23 0416  AST 22  --   --   ALT 11  --   --   ALKPHOS 136*  --   --   BILITOT 0.9  --   --   PROT 7.2  --   --   ALBUMIN  3.4* 3.0* 3.1*   No results for input(s): LIPASE, AMYLASE in the last 168 hours. No results for input(s): AMMONIA in the last 168 hours. Diabetic: No results for input(s): HGBA1C in the last 72 hours. Recent Labs  Lab 12/24/23 1101 12/24/23 1603 12/24/23 2105 12/25/23 0553 12/25/23 1126  GLUCAP 99 109* 75 110* 94   Cardiac Enzymes: No results for input(s): CKTOTAL, CKMB, CKMBINDEX, TROPONINI in the last 168 hours. No results for input(s): PROBNP in the last 8760 hours. Coagulation Profile: Recent Labs  Lab 12/20/23 2055  INR 1.0   Thyroid  Function Tests: No results for input(s): TSH, T4TOTAL, FREET4, T3FREE, THYROIDAB in the last 72 hours. Lipid Profile: No results for input(s): CHOL, HDL, LDLCALC, TRIG, CHOLHDL, LDLDIRECT in the last 72 hours. Anemia Panel: No results for input(s): VITAMINB12, FOLATE, FERRITIN, TIBC, IRON, RETICCTPCT in the last 72 hours. Urine analysis:    Component Value Date/Time   COLORURINE YELLOW 12/22/2023 1339   APPEARANCEUR TURBID (A) 12/22/2023 1339   LABSPEC 1.010 12/22/2023 1339   PHURINE 6.0 12/22/2023 1339   GLUCOSEU >=500 (A) 12/22/2023 1339   GLUCOSEU >=1000 (A) 05/15/2022 1554   HGBUR MODERATE (A) 12/22/2023 1339   BILIRUBINUR NEGATIVE 12/22/2023 1339   BILIRUBINUR negative 09/25/2022 1027   KETONESUR NEGATIVE 12/22/2023 1339   PROTEINUR 100 (A) 12/22/2023 1339   UROBILINOGEN 0.2 09/25/2022 1027   UROBILINOGEN 0.2 05/15/2022 1554   NITRITE NEGATIVE 12/22/2023 1339   LEUKOCYTESUR LARGE (A) 12/22/2023 1339   Sepsis Labs: Invalid input(s): PROCALCITONIN,  LACTICIDVEN  Microbiology: Recent Results (from the past 240 hours)  Resp panel by RT-PCR (RSV, Flu A&B, Covid) Anterior Nasal Swab     Status: None   Collection Time: 12/20/23  3:58 PM   Specimen: Anterior Nasal Swab  Result Value Ref Range Status   SARS Coronavirus 2 by RT PCR NEGATIVE NEGATIVE Final   Influenza A by  PCR NEGATIVE NEGATIVE Final   Influenza B by PCR NEGATIVE NEGATIVE Final    Comment: (NOTE) The Xpert Xpress SARS-CoV-2/FLU/RSV plus assay is intended as an aid in the diagnosis of influenza from Nasopharyngeal swab specimens and should not be used as a sole basis for treatment. Nasal washings and aspirates are unacceptable for Xpert Xpress SARS-CoV-2/FLU/RSV testing.  Fact Sheet for Patients: bloggercourse.com  Fact Sheet for Healthcare Providers: seriousbroker.it  This test is not yet approved or cleared by the [place]  FDA and has been authorized for detection and/or diagnosis of SARS-CoV-2 by FDA under an Emergency Use Authorization (EUA). This EUA will remain in effect (meaning this test can be used) for the duration of the COVID-19 declaration under Section 564(b)(1) of the Act, 21 U.S.C. section 360bbb-3(b)(1), unless the authorization is terminated or revoked.     Resp Syncytial Virus by PCR NEGATIVE NEGATIVE Final    Comment: (NOTE) Fact Sheet for Patients: bloggercourse.com  Fact Sheet for Healthcare Providers: seriousbroker.it  This test is not yet approved or cleared by the [place]  FDA and has been authorized for detection and/or diagnosis of SARS-CoV-2 by FDA under an Emergency Use Authorization (EUA). This EUA will remain in effect (meaning this test can be used) for the duration of the COVID-19 declaration under Section 564(b)(1) of the Act, 21 U.S.C. section 360bbb-3(b)(1), unless the authorization is terminated  or revoked.  Performed at [facility], [address] [place], [name] [address]   Culture, blood (routine x 2)     Status: None   Collection Time: 12/20/23  7:57 PM   Specimen: BLOOD  Result Value Ref Range Status   Specimen Description BLOOD BLOOD RIGHT ARM  Final   Special Requests   Final    AEROBIC BOTTLE ONLY Blood Culture results may not be optimal due to an inadequate volume of blood received in culture bottles   Culture   Final    NO GROWTH 5 DAYS Performed at [facility], [address] [place], [name] [address]    Report Status 12/25/2023 FINAL  Final  Culture, blood (routine x 2)     Status: None   Collection Time: 12/20/23  7:57 PM   Specimen: BLOOD  Result Value Ref Range Status   Specimen Description BLOOD BLOOD LEFT ARM  Final   Special Requests   Final    BOTTLES DRAWN AEROBIC AND ANAEROBIC Blood Culture adequate volume   Culture   Final    NO GROWTH 5 DAYS Performed at [facility], [address] [place], [name] [address]    Report Status 12/25/2023 FINAL  Final  MRSA Next Gen by PCR, Nasal     Status: None   Collection Time: 12/21/23  8:40 AM   Specimen: Nasal Mucosa; Nasal Swab  Result Value Ref Range Status   MRSA by PCR Next Gen NOT DETECTED NOT DETECTED Final    Comment: (NOTE) The GeneXpert MRSA Assay (FDA approved for NASAL specimens only), is one component of a comprehensive MRSA colonization surveillance program. It is not intended to diagnose MRSA infection nor to guide or monitor treatment for MRSA infections. Test performance is not FDA approved in patients less than 75 years old. Performed at [facility], [address], [place], [name] [address]   Urine Culture (for pregnant, neutropenic or urologic patients or patients with an indwelling urinary catheter)     Status: None   Collection Time: 12/23/23 11:07 AM  Specimen: Urine, Clean Catch  Result Value Ref Range Status   Specimen Description URINE,  CLEAN CATCH  Final   Special Requests NONE  Final   Culture   Final    NO GROWTH Performed at [facility], [address] [place], [name] [address]    Report Status 12/24/2023 FINAL  Final    Radiology Studies: No results found.    Leelah Hanna T. Sari Cogan [facility]  If 7PM-7AM, please contact night-coverage [email or web address] 12/27/2023, 1:51 PM

## 2023-12-28 DIAGNOSIS — J9601 Acute respiratory failure with hypoxia: Secondary | ICD-10-CM | POA: Diagnosis not present

## 2023-12-28 LAB — RENAL FUNCTION PANEL
Albumin: 3 g/dL — ABNORMAL LOW (ref 3.5–5.0)
Anion gap: 12 (ref 5–15)
BUN: 31 mg/dL — ABNORMAL HIGH (ref 8–23)
CO2: 23 mmol/L (ref 22–32)
Calcium: 9.5 mg/dL (ref 8.9–10.3)
Chloride: 101 mmol/L (ref 98–111)
Creatinine, Ser: 1.49 mg/dL — ABNORMAL HIGH (ref 0.44–1.00)
GFR, Estimated: 36 mL/min — ABNORMAL LOW (ref >60)
Glucose, Bld: 176 mg/dL — ABNORMAL HIGH (ref 70–99)
Phosphorus: 2.8 mg/dL (ref 2.5–4.6)
Potassium: 3.8 mmol/L (ref 3.5–5.1)
Sodium: 136 mmol/L (ref 135–145)

## 2023-12-28 LAB — CBC
HCT: 38.3 % (ref 36.0–46.0)
Hemoglobin: 12.6 g/dL (ref 12.0–15.0)
MCH: 30.2 pg (ref 26.0–34.0)
MCHC: 32.9 g/dL (ref 30.0–36.0)
MCV: 91.8 fL (ref 80.0–100.0)
Platelets: 314 K/uL (ref 150–400)
RBC: 4.17 MIL/uL (ref 3.87–5.11)
RDW: 14.8 % (ref 11.5–15.5)
WBC: 8.1 K/uL (ref 4.0–10.5)
nRBC: 0 % (ref 0.0–0.2)

## 2023-12-28 LAB — MAGNESIUM: Magnesium: 2 mg/dL (ref 1.7–2.4)

## 2023-12-28 MED ORDER — DICLOFENAC SODIUM 1 % EX GEL: 2.0000 g | Freq: Four times a day (QID) | CUTANEOUS | Status: DC

## 2023-12-28 NOTE — Discharge Summary (Signed)
 Physician Discharge Summary  MALAYA CAGLEY FMW:[id number] DOB: 19-Nov-1945 DOA: 12/20/2023  PCP: Geofm Glade PARAS, MD  Admit date: 12/20/2023 Discharge date: 12/28/23  Admitted From: Home Disposition: SNF Recommendations for Outpatient Follow-up:  Outpatient follow-up with orthopedic surgery in 1 week Check CMP and CBC in 1 week Please follow up on the following pending results: None  Discharge Condition: Stable CODE STATUS: DNR Diet Orders (From admission, onward)     Start     Ordered   12/21/23 0138  Diet regular Room service appropriate? Yes; Fluid consistency: Thin  Diet effective now       Question Answer Comment  Room service appropriate? Yes   Fluid consistency: Thin      12/21/23 0138             Contact information for follow-up providers     [name], MD. Schedule an appointment as soon as possible for a visit in 1 week(s).   Specialty: Orthopedic Surgery Contact information: [address] [name] [address] [phone]              Contact information for after-discharge care     Destination     [facility], [place] .   Service: Skilled Nursing Contact information: [address] [place]  [phone]                     Hospital course 78 y.o. 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     CXR with hyperinflation and emphysema.  Pelvic x-ray without acute finding.  Left tibial/fibular x-ray with septal cortical defect along the anterior aspect of the lateral malleolus on the lateral view only, and osteopenia.  Left ankle  x-ray without acute finding.  CT head without acute finding.  Left femoral x-ray without acute finding or hardware complication but mild degenerative changes of medial compartment of the knee.  Lumbar film with multilevel degenerative change. D-dimer was elevated but VQ scan was negative for PE.  Lower extremity venous Doppler was negative for DVT.  Patient was treated for COPD exacerbation with nebulizers and inhalers with improvement in her oxygen requirement and breathing.   While in hospital, patient had a UA with large pyuria and microscopic hematuria, and started on IV ceftriaxone  for possible UTI.  Urine culture NGTD.  Completed 5 days of ceftriaxone  empirically.   Hospital course complicated by orthostatic hypotension with normal to high supine blood pressure.  Started compression wears and decreased pain meds.  Regards to respiratory failure, patient ambulated on room air and maintained appropriate saturation.   Therapy recommended SNF.  Medically stable for discharge.    See individual problem list below for more.   Problems addressed during this hospitalization Acute on chronic hypoxic respiratory failure- worsening shortness of breath, tachycardia, increased oxygen requirement.  DVT ruled out by negative VQ scan and lower extremity venous Doppler.  CXR suggested hyperinflation/emphysema.  Ambulated to room air and maintained appropriate saturation. - Continue home inhalers.  COPD exacerbation?  Presented with shortness of breath.  Did not require steroid -Continue inhalers as above.   Recurrent fall at [employer]  negative as above Left lower extremity pain-persistent pain since left femoral fracture for which she had IM nailing on 7/28.  She has tenderness over left trochanter but difficult to tell if the tenderness is due to instrumentation - Continue Tylenol , tramadol  and Voltaren gel. -Outpatient follow-up with orthopedic surgery as above. -Continue PT/OT    Orthostatic hypotension/history of essential hypertension: Orthostatic vitals positive.  Symptomatic. -Compression socks and abdominal binder -Not a candidate for midodrine given normal to elevated supine BP -OOB, elevate head of bed -Continue therapy.   Possible UTI-patient had some confusion and lower abdominal pain.  UA obtained and showed pyuria with hematuria.  Started on ceftriaxone .  Urine culture NGTD - Completed 5 days of ceftriaxone  on 10/31.   AKI on CKD-3B: Cr 2.18 (was 1.32 on 9/26).  Creatinine down to 1.5. - Avoid nephrotoxic meds - Discontinue diuretics.   Chronic HFpEF: TTE in 09/2023 with LVEF of 60 to 65%, G1 DD and normal RV SF.  Appears euvolemic on exam. -Discontinue Lasix  and Farxiga  due to risk of dehydration and AKI. -Monitor fluid and respiratory status.   History of CAD/PAD: Stable. -Continue home Toprol -XL, aspirin, Plavix  and statin.   Delirium reportedly had confusion and hallucination likely from pain meds.  No formal diagnosis of cognitive impairment.  Only oriented x 4. -Minimize sedating medications   Controlled NIDDM-2: Last A1c 6.4% on 8/11.  On Farxiga  at home.  CBG within normal range for most part. -Discontinue CBG monitoring and SSI.   Ecchymosis: Skin process across lower abdomen and also on left thigh from prophylactic heparin  injection.  Hemoglobin stable. - Discontinue subcu heparin .  She is on Plavix  and aspirin which would be sufficient for VTE prophylaxis.   Hyperlipidemia -continue statin therapy.  Class I obesity Body mass index is 30.27 kg/m.           Consultations: None  Time spent 35  minutes  Vital signs Vitals:   12/28/23 0415 12/28/23 0500 12/28/23 0736 12/28/23 0756  BP: 119/76  116/61   Pulse: 97  (!) 104   Temp: 97.6 F (36.4 C)  97.6 F (36.4 C)   Resp: 15  16   Height:      Weight:  82.5 kg    SpO2:   93% 93%  TempSrc:   Oral   BMI (Calculated):  30.27       Discharge exam  GENERAL: No  apparent distress.  Nontoxic. HEENT: MMM.  Vision and hearing grossly intact.  NECK: Supple.  No apparent JVD.  RESP:  No IWOB.  Fair aeration bilaterally. CVS:  RRR. Heart sounds normal.  ABD/GI/GU: BS+. Abd soft, NTND.  MSK/EXT:  Moves extremities. Subtotal weakness in left leg.   SKIN: Bruising/ecchymosis across lower abdomen mainly on the left.  Bruising over left thigh. NEURO: AA.  Oriented appropriately.  Subtle weakness in left leg.  PSYCH: Calm. Normal affect.   Discharge Instructions Discharge Instructions     Increase activity slowly   Complete by: As directed       Allergies as of 12/28/2023       Reactions   Gabapentin Other (See Comments)   Fluid retention    Clindamycin  Rash   Sulfa Antibiotics Nausea And Vomiting        Medication List     STOP taking these medications    furosemide  20 MG tablet Commonly known as: LASIX    potassium chloride  SA 20 MEQ tablet Commonly known as: KLOR-CON  M   tizanidine 2 MG  capsule Commonly known as: ZANAFLEX       TAKE these medications    acetaminophen  325 MG tablet Commonly known as: TYLENOL  Take 3 tablets (975 mg total) by mouth every 8 (eight) hours for 5 days, THEN 3 tablets (975 mg total) every 8 (eight) hours as needed for up to 5 days. Start taking on: December 26, 2023   albuterol  (2.5 MG/3ML) 0.083% nebulizer solution Commonly known as: PROVENTIL  USE 1 VIAL IN NEBULIZER EVERY 6 HOURS AND AS NEEDED FOR SHORT BREATH/WHEEZE.  Generic:  Ventolin    allopurinol  300 MG tablet Commonly known as: ZYLOPRIM  Take 1 tablet by mouth once daily   bisacodyl  10 MG suppository Commonly known as: DULCOLAX Place 1 suppository (10 mg total) rectally daily as needed for moderate constipation.   Blood Glucose Monitoring Suppl Devi May substitute to any manufacturer covered by patient's insurance. Patient request Accu-check meter and testing suppliles   BLOOD GLUCOSE TEST STRIPS Strp May substitute to any  manufacturer covered by patient's insurance. Patient to test glucose 2-3 times daily as needed   budesonide -formoterol  160-4.5 MCG/ACT inhaler Commonly known as: Symbicort  Inhale 2 puffs into the lungs 2 (two) times daily.   cholecalciferol 25 MCG (1000 UNIT) tablet Commonly known as: VITAMIN D3 Take 2,000 Units by mouth daily.   clopidogrel  75 MG tablet Commonly known as: PLAVIX  Take 1 tablet by mouth once daily   diclofenac Sodium 1 % Gel Commonly known as: VOLTAREN Apply 2 g topically 4 (four) times daily.   DULoxetine  30 MG capsule Commonly known as: CYMBALTA  Take 2 capsules (60 mg total) by mouth daily.   Farxiga  10 MG Tabs tablet Generic drug: dapagliflozin  propanediol Take 1 tablet (10 mg total) by mouth daily. Take 10 mg by mouth daily.   ferrous sulfate  325 (65 FE) MG tablet Take 325 mg by mouth every other day.   fluticasone  50 MCG/ACT nasal spray Commonly known as: FLONASE  Place 2 sprays into both nostrils daily.   Lancet Device Misc May substitute to any manufacturer covered by patient's insurance.  Patient to test glucose 2-3 times daily as needed   levocetirizine 5 MG tablet Commonly known as: XYZAL  SMARTSIG:1 Tablet(s) By Mouth Every Evening   metoprolol  succinate 25 MG 24 hr tablet Commonly known as: TOPROL -XL Take 1 tablet (25 mg total) by mouth daily. What changed: how much to take   montelukast  10 MG tablet Commonly known as: SINGULAIR  TAKE 1 TABLET BY MOUTH ONCE DAILY AT BEDTIME   Mounjaro  5 MG/0.5ML Pen Generic drug: tirzepatide  Inject 5 mg into the skin once a week.   pantoprazole  40 MG tablet Commonly known as: PROTONIX  Take 1 tablet (40 mg total) by mouth 2 (two) times daily before a meal. What changed: when to take this   polyethylene glycol 17 g packet Commonly known as: MIRALAX  / GLYCOLAX  Take 17 g by mouth 2 (two) times daily.   rosuvastatin  10 MG tablet Commonly known as: CRESTOR  Take 1 tablet (10 mg total) by mouth  daily. What changed:  medication strength how much to take   sodium chloride  0.65 % Soln nasal spray Commonly known as: OCEAN Place 1 spray into both nostrils as needed for congestion.   tiotropium 18 MCG inhalation capsule Commonly known as: Spiriva  HandiHaler Place 1 capsule (18 mcg total) into inhaler and inhale daily.   traMADol  50 MG tablet Commonly known as: ULTRAM  Take 1 tablet (50 mg total) by mouth every 12 (twelve) hours as needed for up to 7 days (chronic  hip pain). What changed:  how much to take when to take this         Procedures/Studies:   NM Pulmonary Perfusion Result Date: 12/23/2023 CLINICAL DATA:  Acute on chronic hypoxic respiratory failure. Elevated D-dimer levels. Question pulmonary embolism. EXAM: NUCLEAR MEDICINE PERFUSION LUNG SCAN TECHNIQUE: Perfusion images were obtained in multiple projections after intravenous injection of radiopharmaceutical. No ventilation imaging performed per usual protocol. RADIOPHARMACEUTICALS:  4.04 mCi Tc-28m MAA IV COMPARISON:  Radiographs 12/23/2023 and 12/20/2023. Chest CTA 12/04/2023 FINDINGS: The pulmonary perfusion is within normal limits. There are no wedge-shaped perfusion defects to suggest acute pulmonary embolism. IMPRESSION: No evidence of acute pulmonary embolism on perfusion scintigraphy by PISAPED criteria. Electronically Signed   By: Elsie Perone M.D.   On: 12/23/2023 16:12   DG CHEST PORT 1 VIEW Result Date: 12/23/2023 CLINICAL DATA:  Hypoxia. EXAM: PORTABLE CHEST 1 VIEW COMPARISON:  Radiographs 12/20/2023 and 10/21/2023. Chest CTA 12/04/2023. FINDINGS: 1113 hours. The heart size and mediastinal contours are stable with aortic atherosclerosis. There is stable mild biapical scarring. The lungs otherwise appear clear. No evidence of pleural effusion or pneumothorax. Asymmetric glenohumeral degenerative changes on the left without evidence of acute osseous abnormality. IMPRESSION: No evidence of acute  cardiopulmonary process. Aortic atherosclerosis. Electronically Signed   By: Elsie Perone M.D.   On: 12/23/2023 16:08   VAS US  LOWER EXTREMITY VENOUS (DVT) Result Date: 12/23/2023  Lower Venous DVT Study Patient Name:  NISHAT LIVINGSTON  Date of Exam:   12/22/2023 Medical Rec #: [id number]       Accession #:    [id number] Date of Birth: 03-20-1945       Patient Gender: F Patient Age:   78 years Exam Location:  [facility] Procedure:      VAS US  LOWER EXTREMITY VENOUS (DVT) Referring Phys: DORN DAWSON --------------------------------------------------------------------------------  Indications: Edema.  Risk Factors: None identified. Limitations: Poor ultrasound/tissue interface and patient positioning. Comparison Study: No prior studies. Performing Technologist: Cordella Collet RVT  Examination Guidelines: A complete evaluation includes B-mode imaging, spectral Doppler, color Doppler, and power Doppler as needed of all accessible portions of each vessel. Bilateral testing is considered an integral part of a complete examination. Limited examinations for reoccurring indications may be performed as noted. The reflux portion of the exam is performed with the patient in reverse Trendelenburg.  +---------+---------------+---------+-----------+----------+--------------+ RIGHT    CompressibilityPhasicitySpontaneityPropertiesThrombus Aging +---------+---------------+---------+-----------+----------+--------------+ CFV      Full           Yes      Yes                                 +---------+---------------+---------+-----------+----------+--------------+ SFJ      Full                                                        +---------+---------------+---------+-----------+----------+--------------+ FV Prox  Full                                                        +---------+---------------+---------+-----------+----------+--------------+ FV Mid   Full                                                         +---------+---------------+---------+-----------+----------+--------------+  FV DistalFull                                                        +---------+---------------+---------+-----------+----------+--------------+ PFV      Full                                                        +---------+---------------+---------+-----------+----------+--------------+ POP      Full           Yes      Yes                                 +---------+---------------+---------+-----------+----------+--------------+ PTV      Full                                                        +---------+---------------+---------+-----------+----------+--------------+ PERO     Full                                                        +---------+---------------+---------+-----------+----------+--------------+   +---------+---------------+---------+-----------+----------+-------------------+ LEFT     CompressibilityPhasicitySpontaneityPropertiesThrombus Aging      +---------+---------------+---------+-----------+----------+-------------------+ CFV      Full           Yes      Yes                                      +---------+---------------+---------+-----------+----------+-------------------+ SFJ      Full                                                             +---------+---------------+---------+-----------+----------+-------------------+ FV Prox  Full                                                             +---------+---------------+---------+-----------+----------+-------------------+ FV Mid   Full                                                             +---------+---------------+---------+-----------+----------+-------------------+ FV DistalFull                                                             +---------+---------------+---------+-----------+----------+-------------------+  PFV      Full                                                              +---------+---------------+---------+-----------+----------+-------------------+ POP      Full           Yes      Yes                                      +---------+---------------+---------+-----------+----------+-------------------+ PTV      Full                                                             +---------+---------------+---------+-----------+----------+-------------------+ PERO                                                  Not well visualized +---------+---------------+---------+-----------+----------+-------------------+     Summary: RIGHT: - There is no evidence of deep vein thrombosis in the lower extremity.  - No cystic structure found in the popliteal fossa.  LEFT: - There is no evidence of deep vein thrombosis in the lower extremity. However, portions of this examination were limited- see technologist comments above.  - No cystic structure found in the popliteal fossa.  *See table(s) above for measurements and observations. Electronically signed by Gaile New MD on 12/23/2023 at 10:25:41 AM.    Final    DG Lumbar Spine 1 View Result Date: 12/22/2023 CLINICAL DATA:  Low back pain. EXAM: LUMBAR SPINE - 1 VIEW COMPARISON:  None Available. FINDINGS: There is no evidence of acute lumbar spine fracture. There is mild anterolisthesis at L4-L5. Intervertebral disc space narrowing is noted at multiple levels and most pronounced at L5-S1. Facet arthropathy is present throughout the lumbar spine. There is atherosclerotic calcification aorta. IMPRESSION: 1. No acute fracture. 2. Multilevel degenerative changes in the lumbar spine. 3. Aortic atherosclerosis. Electronically Signed   By: Leita Birmingham M.D.   On: 12/22/2023 19:12   DG FEMUR PORT MIN 2 VIEWS LEFT Result Date: 12/21/2023 EXAM: 2 VIEW(S) XRAY OF THE LEFT FEMUR 12/21/2023 01:54:23 AM COMPARISON: Comparison left hip x-ray 10/24/1998 and 2025. CLINICAL HISTORY:  99497 Leg pain 00502. Leg pain FINDINGS: BONES AND JOINTS: Left-sided hip screw appears uncomplicated. No evidence for hardware loosening or acute fracture. Joint spaces are maintained. There are mild degenerative changes of the medial compartment of the knee. No focal osseous lesion. No joint dislocation. SOFT TISSUES: The soft tissues are unremarkable. IMPRESSION: 1. No acute fracture or hardware complication. 2. Mild degenerative changes of the medial compartment of the knee. Electronically signed by: Greig Pique MD 12/21/2023 02:04 AM EDT RP Workstation: [id number]   DG Ankle Complete Left Result Date: 12/21/2023 EXAM: 3 OR MORE VIEW(S) XRAY OF THE LEFT ANKLE 12/21/2023 01:54:23 AM CLINICAL HISTORY: Leg pain. COMPARISON: None available. FINDINGS: BONES AND JOINTS: No acute fracture. No joint dislocation. Mild  Tibiotalar degenerative changes. Mild degenerative changes of the dorsal midfoot. Small posterior calcaneal enthesophyte. SOFT TISSUES: The soft tissues are unremarkable. IMPRESSION: 1. No acute osseous abnormality. Electronically signed by: Pinkie Pebbles MD 12/21/2023 02:02 AM EDT RP Workstation: [id number]   CT Head Wo Contrast Result Date: 12/20/2023 EXAM: CT HEAD WITHOUT CONTRAST 12/20/2023 09:52:37 PM TECHNIQUE: CT of the head was performed without the administration of intravenous contrast. Automated exposure control, iterative reconstruction, and/or weight based adjustment of the mA/kV was utilized to reduce the radiation dose to as low as reasonably achievable. COMPARISON: 10/21/2023 CLINICAL HISTORY: Memory loss. Pt states multiple falls and hallucinations. FINDINGS: BRAIN AND VENTRICLES: No acute hemorrhage. No evidence of acute infarct. 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 Stable remote lacunar infarct in left lentiform nucleus. Stable white matter changes compared to prior study. No hydrocephalus. No extra-axial collection. No mass effect or midline shift.  Atherosclerotic calcifications in bilateral cavernous internal carotid arteries. ORBITS: Bilateral lens replacements noted. SINUSES: Fluid in posterior left ethmoid air cells. SOFT TISSUES AND SKULL: No acute soft tissue abnormality. No skull fracture. IMPRESSION: 1. No acute intracranial abnormality. Electronically signed by: Pinkie Pebbles MD 12/20/2023 09:56 PM EDT RP Workstation: [id number]   DG Chest Portable 1 View Result Date: 12/20/2023 CLINICAL DATA:  Shortness of breath.  Status post fall, pain. EXAM: PORTABLE CHEST 1 VIEW COMPARISON:  Radiograph 10/21/2023, CT 12/04/2023 FINDINGS: Hyperinflation and emphysema. Normal heart size with stable mediastinal contours. Aortic atherosclerosis. No confluent opacity, large pleural effusion or pneumothorax. No pulmonary edema. On limited assessment, no acute osseous findings. IMPRESSION: Hyperinflation and emphysema. No acute findings. Electronically Signed   By: Andrea Gasman M.D.   On: 12/20/2023 17:19   DG Pelvis 1-2 Views Result Date: 12/20/2023 CLINICAL DATA:  Pain after fall. EXAM: DG PELVIS 1-2V COMPARISON:  Radiograph 10/21/2023 FINDINGS: Femoral intramedullary nail and trans trochanteric screw fixation of previous intertrochanteric femur fracture. Interval fracture healing from initial injury radiograph. The hardware is intact were visualized. No evidence of acute pelvic fracture. Pubic rami are intact. No pubic symphyseal or sacroiliac diastasis. IMPRESSION: 1. No acute pelvic fracture. 2. ORIF of previous intertrochanteric femur fracture. Electronically Signed   By: Andrea Gasman M.D.   On: 12/20/2023 17:16   DG Tibia/Fibula Left Result Date: 12/20/2023 EXAM: 2 VIEW(S) XRAY OF THE LEFT TIBIA AND FIBULA 12/20/2023 04:46:00 PM COMPARISON: None available. CLINICAL HISTORY: Pain after fall. Fall w/ pain. FINDINGS: BONES AND JOINTS: Degenerative changes of the knee and ankle. On the lateral view only, there is a subtle cortical defect along  the anterior aspect of the lateral malleolus. This is not confirmed on any other view. The bones are osteopenic. No joint dislocation. SOFT TISSUES: The soft tissues are unremarkable. IMPRESSION: 1. Subtle cortical defect along the anterior aspect of the lateral malleolus on the lateral view only, not confirmed on other views. A subtle nondisplaced fracture cannot be excluded. If there is high clinical concern for an acute fracture at this level, recommend recommend dedicated images of the left ankle. 2. Osteopenia. Electronically signed by: Greig Pique MD 12/20/2023 05:16 PM EDT RP Workstation: [id number]   CT ANGIO CHEST/ABD/PEL FOR DISSECTION W &/OR WO CONTRAST Result Date: 12/08/2023 CLINICAL DATA:  Thoracic aortic aneurysm EXAM: CT ANGIOGRAPHY CHEST, ABDOMEN AND PELVIS TECHNIQUE: Non-contrast CT of the chest was initially obtained. Multidetector CT imaging through the chest, abdomen and pelvis was performed using the standard protocol during bolus administration of intravenous contrast. Multiplanar reconstructed images and MIPs were obtained  and reviewed to evaluate the vascular anatomy. RADIATION DOSE REDUCTION: This exam was performed according to the departmental dose-optimization program which includes automated exposure control, adjustment of the mA and/or kV according to patient size and/or use of iterative reconstruction technique. CONTRAST:  75mL OMNIPAQUE  IOHEXOL  350 MG/ML SOLN COMPARISON:  10/21/2023 FINDINGS: CTA CHEST FINDINGS VASCULAR Aorta: Satisfactory opacification of the aorta. Severe thoracic aortic atherosclerosis with a large burden of irregular mural thrombus throughout the descending thoracic aorta. Tubular ascending thoracic aorta measures up to 3.9 x 3.8 cm. Descending thoracic aorta measures up to 3.4 x 3.3 cm. No evidence of dissection or other acute findings. Cardiovascular: No evidence of pulmonary embolism on limited non-tailored examination. Normal heart size. Left and right  coronary artery calcifications. No pericardial effusion. Review of the MIP images confirms the above findings. NON VASCULAR Mediastinum/Nodes: No enlarged mediastinal, hilar, or axillary lymph nodes. Thyroid  gland, trachea, and esophagus demonstrate no significant findings. Lungs/Pleura: Moderate centrilobular emphysema. No pleural effusion or pneumothorax. Musculoskeletal: No chest wall abnormality. No acute osseous findings. Multiple subacute, partially callused fractures of the anterolateral left ribs. Review of the MIP images confirms the above findings. CTA ABDOMEN AND PELVIS FINDINGS VASCULAR Normal caliber of the abdominal aorta. Severe, irregular abdominal aortic atherosclerosis. No evidence of aneurysm, dissection, or other acute aortic pathology. 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 The vessel remains patent through its proximal order branches. Review of the MIP images confirms the above findings. NON-VASCULAR Hepatobiliary: No solid liver abnormality is seen. No gallstones, gallbladder wall thickening, or biliary dilatation. Pancreas: Unremarkable. No pancreatic ductal dilatation or surrounding inflammatory changes. Spleen: Normal in size without significant abnormality. Adrenals/Urinary Tract: Adrenal glands are unremarkable. Severely atrophic left kidney. Multifocal scarring of the right renal cortex. No calculi or hydronephrosis. Bladder is unremarkable. Stomach/Bowel: Stomach is within normal limits. Appendix appears normal. No evidence of bowel wall thickening, distention, or inflammatory changes. Lymphatic: No enlarged abdominal or pelvic lymph nodes. Reproductive: Hysterectomy. Other: No abdominal wall hernia or abnormality. No ascites. Musculoskeletal: No acute osseous findings. Intramedullary nail fixation of  the proximal left femur with subacute, comminuted intratrochanteric fractures. IMPRESSION: 1. Severe thoracic and abdominal aortic atherosclerosis. 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 Tubular ascending thoracic aorta measures up to 3.9 x 3.8 cm. Descending thoracic aorta measures up to 3.4 x 3.3 cm. 2. No evidence of dissection or other acute findings. 3. 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 4. 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 The vessel remains patent through its proximal order branches. 5. Coronary artery disease. 6. Emphysema. Aortic Atherosclerosis (ICD10-I70.0) and Emphysema (ICD10-J43.9). Electronically Signed   By: Marolyn JONETTA Jaksch M.D.   On: 12/08/2023 17:47       The results of significant diagnostics from this hospitalization (including imaging, microbiology, ancillary and laboratory) are listed below for reference.     Microbiology: Recent Results (from the past 240 hours)  Resp panel by RT-PCR (RSV, Flu A&B, Covid) Anterior Nasal Swab     Status: None   Collection Time: 12/20/23  3:58 PM   Specimen: Anterior Nasal Swab  Result Value Ref Range Status   SARS Coronavirus 2 by RT PCR NEGATIVE NEGATIVE Final  Influenza A by PCR NEGATIVE NEGATIVE Final   Influenza B by PCR NEGATIVE NEGATIVE Final    Comment: (NOTE) The Xpert Xpress SARS-CoV-2/FLU/RSV plus assay is intended as an aid in the diagnosis of influenza from Nasopharyngeal swab specimens and should not be used as a sole basis for treatment. Nasal washings and aspirates are unacceptable for Xpert Xpress SARS-CoV-2/FLU/RSV testing.  Fact Sheet for Patients: bloggercourse.com  Fact Sheet for Healthcare Providers: seriousbroker.it  This test is not yet approved or  cleared by the [place]  FDA and has been authorized for detection and/or diagnosis of SARS-CoV-2 by FDA under an Emergency Use Authorization (EUA). This EUA will remain in effect (meaning this test can be used) for the duration of the COVID-19 declaration under Section 564(b)(1) of the Act, 21 U.S.C. section 360bbb-3(b)(1), unless the authorization is terminated or revoked.     Resp Syncytial Virus by PCR NEGATIVE NEGATIVE Final    Comment: (NOTE) Fact Sheet for Patients: bloggercourse.com  Fact Sheet for Healthcare Providers: seriousbroker.it  This test is not yet approved or cleared by the [place]  FDA and has been authorized for detection and/or diagnosis of SARS-CoV-2 by FDA under an Emergency Use Authorization (EUA). This EUA will remain in effect (meaning this test can be used) for the duration of the COVID-19 declaration under Section 564(b)(1) of the Act, 21 U.S.C. section 360bbb-3(b)(1), unless the authorization is terminated or revoked.  Performed at [facility], [address] [place], [name] [address]   Culture, blood (routine x 2)     Status: None   Collection Time: 12/20/23  7:57 PM   Specimen: BLOOD  Result Value Ref Range Status   Specimen Description BLOOD BLOOD RIGHT ARM  Final   Special Requests   Final    AEROBIC BOTTLE ONLY Blood Culture results may not be optimal due to an inadequate volume of blood received in culture bottles   Culture   Final    NO GROWTH 5 DAYS Performed at [facility], [address], [place], [name] [address]    Report Status 12/25/2023 FINAL  Final  Culture, blood (routine x 2)     Status: None   Collection Time: 12/20/23  7:57 PM   Specimen: BLOOD  Result Value Ref Range Status   Specimen Description BLOOD BLOOD LEFT ARM  Final   Special Requests   Final    BOTTLES DRAWN AEROBIC AND ANAEROBIC Blood Culture adequate volume   Culture   Final    NO  GROWTH 5 DAYS Performed at [facility], [address] [place], [name] [address]    Report Status 12/25/2023 FINAL  Final  MRSA Next Gen by PCR, Nasal     Status: None   Collection Time: 12/21/23  8:40 AM   Specimen: Nasal Mucosa; Nasal Swab  Result Value Ref Range Status   MRSA by PCR Next Gen NOT DETECTED NOT DETECTED Final    Comment: (NOTE) The GeneXpert MRSA Assay (FDA approved for NASAL specimens only), is one component of a comprehensive MRSA colonization surveillance program. It is not intended to diagnose MRSA infection nor to guide or monitor treatment for MRSA infections. Test performance is not FDA approved in patients less than 39 years old. Performed at [facility], [address], [place], [name] [address]   Urine Culture (for pregnant, neutropenic or urologic patients or patients with an indwelling urinary catheter)     Status: None   Collection Time: 12/23/23  11:07 AM   Specimen: Urine, Clean Catch  Result Value Ref Range Status   Specimen Description URINE, CLEAN CATCH  Final   Special Requests NONE  Final   Culture   Final    NO GROWTH Performed at [facility], [address] [place], [name] [address]    Report Status 12/24/2023 FINAL  Final     Labs:  CBC: Recent Labs  Lab 12/26/23 0355 12/28/23 0436  WBC 8.9 8.1  HGB 13.2 12.6  HCT 41.2 38.3  MCV 93.6 91.8  PLT 239 314   BMP &GFR Recent Labs  Lab 12/24/23 0249 12/25/23 0306 12/26/23 0355 12/27/23 0416 12/28/23 0436 12/28/23 0752  NA 133* 133* 135 134*  --  136  K 3.7 3.8 3.9 3.9  --  3.8  CL 97* 94* 100 100  --  101  CO2 27 27 23 25   --  23  GLUCOSE 96 111* 101* 102*  --  176*  BUN 13 17 25* 24*  --  31*  CREATININE 2.09* 1.95* 1.72* 1.57*  --  1.49*  CALCIUM  9.3 10.0 9.5 9.3  --  9.5  MG  --   --  1.8  --  2.0  --   PHOS  --   --  3.4 3.0  --  2.8   Estimated Creatinine Clearance: 33 mL/min (A) (by C-G formula based on SCr of 1.49 mg/dL (H)). Liver &  Pancreas: Recent Labs  Lab 12/26/23 0355 12/27/23 0416 12/28/23 0752  ALBUMIN  3.0* 3.1* 3.0*   No results for input(s): LIPASE, AMYLASE in the last 168 hours. No results for input(s): AMMONIA in the last 168 hours. Diabetic: No results for input(s): HGBA1C in the last 72 hours. Recent Labs  Lab 12/24/23 1101 12/24/23 1603 12/24/23 2105 12/25/23 0553 12/25/23 1126  GLUCAP 99 109* 75 110* 94   Cardiac Enzymes: No results for input(s): CKTOTAL, CKMB, CKMBINDEX, TROPONINI in the last 168 hours. No results for input(s): PROBNP in the last 8760 hours. Coagulation Profile: No results for input(s): INR, PROTIME in the last 168 hours. Thyroid  Function Tests: No results for input(s): TSH, T4TOTAL, FREET4, T3FREE, THYROIDAB in the last 72 hours. Lipid Profile: No results for input(s): CHOL, HDL, LDLCALC, TRIG, CHOLHDL, LDLDIRECT in the last 72 hours. Anemia Panel: No results for input(s): VITAMINB12, FOLATE, FERRITIN, TIBC, IRON, RETICCTPCT in the last 72 hours. Urine analysis:    Component Value Date/Time   COLORURINE YELLOW 12/22/2023 1339   APPEARANCEUR TURBID (A) 12/22/2023 1339   LABSPEC 1.010 12/22/2023 1339   PHURINE 6.0 12/22/2023 1339   GLUCOSEU >=500 (A) 12/22/2023 1339   GLUCOSEU >=1000 (A) 05/15/2022 1554   HGBUR MODERATE (A) 12/22/2023 1339   BILIRUBINUR NEGATIVE 12/22/2023 1339   BILIRUBINUR negative 09/25/2022 1027   KETONESUR NEGATIVE 12/22/2023 1339   PROTEINUR 100 (A) 12/22/2023 1339   UROBILINOGEN 0.2 09/25/2022 1027   UROBILINOGEN 0.2 05/15/2022 1554   NITRITE NEGATIVE 12/22/2023 1339   LEUKOCYTESUR LARGE (A) 12/22/2023 1339   Sepsis Labs: Invalid input(s): PROCALCITONIN, LACTICIDVEN   SIGNED:  Ondria Oswald T Deairra Halleck, MD  [facility] 12/28/2023, 10:52 AM

## 2023-12-28 NOTE — TOC Progression Note (Signed)
 Transition of Care [facility]) - Progression Note    Patient Details  Name: Kerry Powell MRN: [id number] Date of Birth: 10/31/1945  [facility] CM/SW Contact  Bridget Cordella Simmonds, LCSW Phone Number: 12/28/2023, 1:56 PM  Clinical Narrative:   PASSR still pending.     Expected Discharge Plan: Skilled Nursing Facility Barriers to Discharge: Awaiting State Approval (PASRR)               Expected Discharge Plan and Services In-house Referral: Clinical Social Work   Post Acute Care Choice: Skilled Nursing Facility Living arrangements for the past 2 months: Single Family Home Expected Discharge Date: 12/28/23                                     Social Drivers of Health (SDOH) Interventions SDOH Screenings   Food Insecurity: No Food Insecurity (12/21/2023)  Housing: Low Risk  (12/21/2023)  Transportation Needs: No Transportation Needs (12/21/2023)  Utilities: Not At Risk (12/21/2023)  Alcohol  Screen: Low Risk  (05/15/2023)  Depression (PHQ2-9): Low Risk  (11/20/2023)  Financial Resource Strain: Low Risk  (12/08/2023)  Physical Activity: Inactive (12/08/2023)  Social Connections: Unknown (12/24/2023)  Recent Concern: Social Connections - Socially Isolated (12/21/2023)  Stress: Stress Concern Present (12/08/2023)  Tobacco Use: Medium Risk (12/23/2023)  Health Literacy: Adequate Health Literacy (05/15/2023)    Readmission Risk Interventions     No data to display

## 2023-12-29 DIAGNOSIS — J9601 Acute respiratory failure with hypoxia: Secondary | ICD-10-CM | POA: Diagnosis not present

## 2023-12-29 DIAGNOSIS — R296 Repeated falls: Secondary | ICD-10-CM | POA: Diagnosis not present

## 2023-12-29 DIAGNOSIS — M79605 Pain in left leg: Secondary | ICD-10-CM | POA: Diagnosis not present

## 2023-12-29 MED ORDER — MIDODRINE HCL 5 MG PO TABS: 5.0000 mg | ORAL_TABLET | Freq: Three times a day (TID) | ORAL | Status: DC

## 2023-12-29 MED ORDER — OXYBUTYNIN CHLORIDE 5 MG PO TABS
2.5000 mg | ORAL_TABLET | Freq: Once | ORAL | Status: AC
  Administered 2023-12-29: 2.5 mg via ORAL
  Filled 2023-12-29: qty 1

## 2023-12-29 MED ORDER — MIDODRINE HCL 5 MG PO TABS
5.0000 mg | ORAL_TABLET | Freq: Three times a day (TID) | ORAL | Status: DC
  Administered 2023-12-29 – 2023-12-31 (×7): 5 mg via ORAL
  Filled 2023-12-29 (×6): qty 1

## 2023-12-29 NOTE — Discharge Summary (Signed)
 Physician Discharge Summary  Kerry Powell FMW:[id number] DOB: [birth year] DOA: 12/20/2023  PCP: Geofm Glade PARAS, MD  Admit date: 12/20/2023 Discharge date: 12/29/23  Admitted From: Home Disposition: SNF Recommendations for Outpatient Follow-up:  Outpatient follow-up with orthopedic surgery in 1 week Check CMP and CBC in 1 week Please follow up on the following pending results: None  Discharge Condition: Stable CODE STATUS: DNR Diet Orders (From admission, onward)     Start     Ordered   12/21/23 0138  Diet regular Room service appropriate? Yes; Fluid consistency: Thin  Diet effective now       Question Answer Comment  Room service appropriate? Yes   Fluid consistency: Thin      12/21/23 0138             Contact information for follow-up providers     [name], MD. Schedule an appointment as soon as possible for a visit in 1 week(s).   Specialty: Orthopedic Surgery Contact information: [address] [name] [address] [phone]              Contact information for after-discharge care     Destination     [facility], [place] .   Service: Skilled Nursing Contact information: [address] [place]  [phone]                     Hospital course 78 y.o. 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     CXR with hyperinflation and emphysema.  Pelvic x-ray without acute finding.  Left tibial/fibular x-ray with septal cortical defect along the anterior aspect of the lateral malleolus on the lateral view only, and osteopenia.  Left ankle  x-ray without acute finding.  CT head without acute finding.  Left femoral x-ray without acute finding or hardware complication but mild degenerative changes of medial compartment of the knee.  Lumbar film with multilevel degenerative change. D-dimer was elevated but VQ scan was negative for PE.  Lower extremity venous Doppler was negative for DVT.  Patient was treated for COPD exacerbation with nebulizers and inhalers with improvement in her oxygen requirement and breathing.   While in hospital, patient had a UA with large pyuria and microscopic hematuria, and started on IV ceftriaxone  for possible UTI.  Urine culture NGTD.  Completed 5 days of ceftriaxone  empirically.   Hospital course complicated by orthostatic hypotension with normal to high supine blood pressure.  Started compression wears and decreased pain meds.  Regards to respiratory failure, patient ambulated on room air and maintained appropriate saturation.   Therapy recommended SNF.  Medically stable for discharge.    See individual problem list below for more.   Problems addressed during this hospitalization Acute on chronic hypoxic respiratory failure- worsening shortness of breath, tachycardia, increased oxygen requirement.  DVT ruled out by negative VQ scan and lower extremity venous Doppler.  CXR suggested hyperinflation/emphysema.  Ambulated to room air and maintained appropriate saturation. - Continue home inhalers.  COPD exacerbation?  Presented with shortness of breath.  Did not require steroid -Continue inhalers as above.   Recurrent fall at [employer]  negative as above Left lower extremity pain-persistent pain since left femoral fracture for which she had IM nailing on 7/28.  She has tenderness over left trochanter but difficult to tell if the tenderness is due to instrumentation - Continue Tylenol , tramadol  and Voltaren gel. -Outpatient follow-up with orthopedic surgery as above. -Continue PT/OT    Orthostatic hypotension/history of essential hypertension: Orthostatic vitals positive.  Symptomatic. -Compression socks and abdominal binder -Midodrine 5 mg TID  -OOB, elevate head of bed -Continue therapy.   Possible UTI-patient had some confusion and lower abdominal pain.  UA obtained and showed pyuria with hematuria.  Started on ceftriaxone .  Urine culture NGTD - Completed 5 days of ceftriaxone  on 10/31.   AKI on CKD-3B: Cr 2.18 (was 1.32 on 9/26).  Creatinine down to 1.5. - Avoid nephrotoxic meds - Discontinue diuretics.   Chronic HFpEF: TTE in 09/2023 with LVEF of 60 to 65%, G1 DD and normal RV SF.  Appears euvolemic on exam. -Discontinue Lasix  and Farxiga  due to risk of dehydration and AKI. -Monitor fluid and respiratory status.   History of CAD/PAD: Stable. -Continue home Toprol -XL, aspirin, Plavix  and statin.   Delirium reportedly had confusion and hallucination likely from pain meds.  No formal diagnosis of cognitive impairment.  Only oriented x 4. -Minimize sedating medications   Controlled NIDDM-2: Last A1c 6.4% on 8/11.  On Farxiga  at home.  CBG within normal range for most part. -Discontinue CBG monitoring and SSI.   Ecchymosis: Skin process across lower abdomen and also on left thigh from prophylactic heparin  injection.  Hemoglobin stable. - Discontinued subcu heparin .  She is on Plavix  and aspirin which would be sufficient for VTE prophylaxis.   Hyperlipidemia -continue statin therapy.  Class I obesity Body mass index is 30.82 kg/m.           Consultations: None  Time spent 35  minutes  Vital signs Vitals:   12/28/23 1948 12/29/23 0500 12/29/23 0516 12/29/23 0805  BP: (!) 115/58  102/60 99/75  Pulse: (!) 52  (!) 111 (!) 113  Temp: 97.6 F (36.4 C)  98 F (36.7 C)   Resp: 18  20 12   Height:      Weight:  84 kg    SpO2: 96%  99% 100%  TempSrc: Oral     BMI (Calculated):  30.82       Discharge exam  GENERAL: No apparent distress.   Nontoxic. HEENT: MMM.  Vision and hearing grossly intact.  NECK: Supple.  No apparent JVD.  RESP:  No IWOB.  Fair aeration bilaterally. CVS:  RRR. Heart sounds normal.  ABD/GI/GU: BS+. Abd soft, NTND.  MSK/EXT:  Moves extremities. Subtotal weakness in left leg.   SKIN: Bruising/ecchymosis across lower abdomen mainly on the left.  Bruising over left thigh. NEURO: AA.  Oriented appropriately.  Subtle weakness in left leg.  PSYCH: Calm. Normal affect.   Discharge Instructions Discharge Instructions     Increase activity slowly   Complete by: As directed       Allergies as of 12/29/2023       Reactions   Gabapentin Other (See Comments)   Fluid retention    Clindamycin  Rash   Sulfa Antibiotics Nausea And Vomiting        Medication List     STOP taking these medications    furosemide  20 MG tablet Commonly known as: LASIX    potassium chloride  SA 20 MEQ tablet Commonly known as: KLOR-CON  M   tizanidine 2 MG capsule Commonly known  as: ZANAFLEX       TAKE these medications    acetaminophen  325 MG tablet Commonly known as: TYLENOL  Take 3 tablets (975 mg total) by mouth every 8 (eight) hours for 5 days, THEN 3 tablets (975 mg total) every 8 (eight) hours as needed for up to 5 days. Start taking on: December 26, 2023   albuterol  (2.5 MG/3ML) 0.083% nebulizer solution Commonly known as: PROVENTIL  USE 1 VIAL IN NEBULIZER EVERY 6 HOURS AND AS NEEDED FOR SHORT BREATH/WHEEZE.  Generic:  Ventolin    allopurinol  300 MG tablet Commonly known as: ZYLOPRIM  Take 1 tablet by mouth once daily   bisacodyl  10 MG suppository Commonly known as: DULCOLAX Place 1 suppository (10 mg total) rectally daily as needed for moderate constipation.   Blood Glucose Monitoring Suppl Devi May substitute to any manufacturer covered by patient's insurance. Patient request Accu-check meter and testing suppliles   BLOOD GLUCOSE TEST STRIPS Strp May substitute to any manufacturer covered by  patient's insurance. Patient to test glucose 2-3 times daily as needed   budesonide -formoterol  160-4.5 MCG/ACT inhaler Commonly known as: Symbicort  Inhale 2 puffs into the lungs 2 (two) times daily.   cholecalciferol 25 MCG (1000 UNIT) tablet Commonly known as: VITAMIN D3 Take 2,000 Units by mouth daily.   clopidogrel  75 MG tablet Commonly known as: PLAVIX  Take 1 tablet by mouth once daily   diclofenac Sodium 1 % Gel Commonly known as: VOLTAREN Apply 2 g topically 4 (four) times daily.   DULoxetine  30 MG capsule Commonly known as: CYMBALTA  Take 2 capsules (60 mg total) by mouth daily.   Farxiga  10 MG Tabs tablet Generic drug: dapagliflozin  propanediol Take 1 tablet (10 mg total) by mouth daily. Take 10 mg by mouth daily.   ferrous sulfate  325 (65 FE) MG tablet Take 325 mg by mouth every other day.   fluticasone  50 MCG/ACT nasal spray Commonly known as: FLONASE  Place 2 sprays into both nostrils daily.   Lancet Device Misc May substitute to any manufacturer covered by patient's insurance.  Patient to test glucose 2-3 times daily as needed   levocetirizine 5 MG tablet Commonly known as: XYZAL  SMARTSIG:1 Tablet(s) By Mouth Every Evening   metoprolol  succinate 25 MG 24 hr tablet Commonly known as: TOPROL -XL Take 1 tablet (25 mg total) by mouth daily. What changed: how much to take   midodrine 5 MG tablet Commonly known as: PROAMATINE Take 1 tablet (5 mg total) by mouth 3 (three) times daily with meals.   montelukast  10 MG tablet Commonly known as: SINGULAIR  TAKE 1 TABLET BY MOUTH ONCE DAILY AT BEDTIME   Mounjaro  5 MG/0.5ML Pen Generic drug: tirzepatide  Inject 5 mg into the skin once a week.   pantoprazole  40 MG tablet Commonly known as: PROTONIX  Take 1 tablet (40 mg total) by mouth 2 (two) times daily before a meal. What changed: when to take this   polyethylene glycol 17 g packet Commonly known as: MIRALAX  / GLYCOLAX  Take 17 g by mouth 2 (two) times  daily.   rosuvastatin  10 MG tablet Commonly known as: CRESTOR  Take 1 tablet (10 mg total) by mouth daily. What changed:  medication strength how much to take   sodium chloride  0.65 % Soln nasal spray Commonly known as: OCEAN Place 1 spray into both nostrils as needed for congestion.   tiotropium 18 MCG inhalation capsule Commonly known as: Spiriva  HandiHaler Place 1 capsule (18 mcg total) into inhaler and inhale daily.   traMADol  50 MG tablet Commonly known as:  ULTRAM  Take 1 tablet (50 mg total) by mouth every 12 (twelve) hours as needed for up to 7 days (chronic hip pain). What changed:  how much to take when to take this         Procedures/Studies:   NM Pulmonary Perfusion Result Date: 12/23/2023 CLINICAL DATA:  Acute on chronic hypoxic respiratory failure. Elevated D-dimer levels. Question pulmonary embolism. EXAM: NUCLEAR MEDICINE PERFUSION LUNG SCAN TECHNIQUE: Perfusion images were obtained in multiple projections after intravenous injection of radiopharmaceutical. No ventilation imaging performed per usual protocol. RADIOPHARMACEUTICALS:  4.04 mCi Tc-61m MAA IV COMPARISON:  Radiographs 12/23/2023 and 12/20/2023. Chest CTA 12/04/2023 FINDINGS: The pulmonary perfusion is within normal limits. There are no wedge-shaped perfusion defects to suggest acute pulmonary embolism. IMPRESSION: No evidence of acute pulmonary embolism on perfusion scintigraphy by PISAPED criteria. Electronically Signed   By: Elsie Perone M.D.   On: 12/23/2023 16:12   DG CHEST PORT 1 VIEW Result Date: 12/23/2023 CLINICAL DATA:  Hypoxia. EXAM: PORTABLE CHEST 1 VIEW COMPARISON:  Radiographs 12/20/2023 and 10/21/2023. Chest CTA 12/04/2023. FINDINGS: 1113 hours. The heart size and mediastinal contours are stable with aortic atherosclerosis. There is stable mild biapical scarring. The lungs otherwise appear clear. No evidence of pleural effusion or pneumothorax. Asymmetric glenohumeral degenerative changes  on the left without evidence of acute osseous abnormality. IMPRESSION: No evidence of acute cardiopulmonary process. Aortic atherosclerosis. Electronically Signed   By: Elsie Perone M.D.   On: 12/23/2023 16:08   VAS US  LOWER EXTREMITY VENOUS (DVT) Result Date: 12/23/2023  Lower Venous DVT Study Patient Name:  LI BOBO  Date of Exam:   12/22/2023 Medical Rec #: [id number]       Accession #:    [id number] Date of Birth: Jul 12, 1945       Patient Gender: F Patient Age:   78 years Exam Location[facility]  [facility] Procedure:      VAS US  LOWER EXTREMITY VENOUS (DVT) Referring Phys: DORN DAWSON --------------------------------------------------------------------------------  Indications: Edema.  Risk Factors: None identified. Limitations: Poor ultrasound/tissue interface and patient positioning. Comparison Study: No prior studies. Performing Technologist: Cordella Collet RVT  Examination Guidelines: A complete evaluation includes B-mode imaging, spectral Doppler, color Doppler, and power Doppler as needed of all accessible portions of each vessel. Bilateral testing is considered an integral part of a complete examination. Limited examinations for reoccurring indications may be performed as noted. The reflux portion of the exam is performed with the patient in reverse Trendelenburg.  +---------+---------------+---------+-----------+----------+--------------+ RIGHT    CompressibilityPhasicitySpontaneityPropertiesThrombus Aging +---------+---------------+---------+-----------+----------+--------------+ CFV      Full           Yes      Yes                                 +---------+---------------+---------+-----------+----------+--------------+ SFJ      Full                                                        +---------+---------------+---------+-----------+----------+--------------+ FV Prox  Full                                                         +---------+---------------+---------+-----------+----------+--------------+  FV Mid   Full                                                        +---------+---------------+---------+-----------+----------+--------------+ FV DistalFull                                                        +---------+---------------+---------+-----------+----------+--------------+ PFV      Full                                                        +---------+---------------+---------+-----------+----------+--------------+ POP      Full           Yes      Yes                                 +---------+---------------+---------+-----------+----------+--------------+ PTV      Full                                                        +---------+---------------+---------+-----------+----------+--------------+ PERO     Full                                                        +---------+---------------+---------+-----------+----------+--------------+   +---------+---------------+---------+-----------+----------+-------------------+ LEFT     CompressibilityPhasicitySpontaneityPropertiesThrombus Aging      +---------+---------------+---------+-----------+----------+-------------------+ CFV      Full           Yes      Yes                                      +---------+---------------+---------+-----------+----------+-------------------+ SFJ      Full                                                             +---------+---------------+---------+-----------+----------+-------------------+ FV Prox  Full                                                             +---------+---------------+---------+-----------+----------+-------------------+ FV Mid   Full                                                             +---------+---------------+---------+-----------+----------+-------------------+  FV DistalFull                                                              +---------+---------------+---------+-----------+----------+-------------------+ PFV      Full                                                             +---------+---------------+---------+-----------+----------+-------------------+ POP      Full           Yes      Yes                                      +---------+---------------+---------+-----------+----------+-------------------+ PTV      Full                                                             +---------+---------------+---------+-----------+----------+-------------------+ PERO                                                  Not well visualized +---------+---------------+---------+-----------+----------+-------------------+     Summary: RIGHT: - There is no evidence of deep vein thrombosis in the lower extremity.  - No cystic structure found in the popliteal fossa.  LEFT: - There is no evidence of deep vein thrombosis in the lower extremity. However, portions of this examination were limited- see technologist comments above.  - No cystic structure found in the popliteal fossa.  *See table(s) above for measurements and observations. Electronically signed by Gaile New MD on 12/23/2023 at 10:25:41 AM.    Final    DG Lumbar Spine 1 View Result Date: 12/22/2023 CLINICAL DATA:  Low back pain. EXAM: LUMBAR SPINE - 1 VIEW COMPARISON:  None Available. FINDINGS: There is no evidence of acute lumbar spine fracture. There is mild anterolisthesis at L4-L5. Intervertebral disc space narrowing is noted at multiple levels and most pronounced at L5-S1. Facet arthropathy is present throughout the lumbar spine. There is atherosclerotic calcification aorta. IMPRESSION: 1. No acute fracture. 2. Multilevel degenerative changes in the lumbar spine. 3. Aortic atherosclerosis. Electronically Signed   By: Leita Birmingham M.D.   On: 12/22/2023 19:12   DG FEMUR PORT MIN 2 VIEWS LEFT Result Date: 12/21/2023 EXAM: 2 VIEW(S) XRAY  OF THE LEFT FEMUR 12/21/2023 01:54:23 AM COMPARISON: Comparison left hip x-ray 10/24/1998 and 2025. CLINICAL HISTORY: 00502 Leg pain 99497. Leg pain FINDINGS: BONES AND JOINTS: Left-sided hip screw appears uncomplicated. No evidence for hardware loosening or acute fracture. Joint spaces are maintained. There are mild degenerative changes of the medial compartment of the knee. No focal osseous lesion. No joint dislocation. SOFT TISSUES: The soft tissues are unremarkable. IMPRESSION: 1. No acute fracture or hardware complication. 2.  Mild degenerative changes of the medial compartment of the knee. Electronically signed by: Greig Pique MD 12/21/2023 02:04 AM EDT RP Workstation: [id number]   DG Ankle Complete Left Result Date: 12/21/2023 EXAM: 3 OR MORE VIEW(S) XRAY OF THE LEFT ANKLE 12/21/2023 01:54:23 AM CLINICAL HISTORY: Leg pain. COMPARISON: None available. FINDINGS: BONES AND JOINTS: No acute fracture. No joint dislocation. Mild Tibiotalar degenerative changes. Mild degenerative changes of the dorsal midfoot. Small posterior calcaneal enthesophyte. SOFT TISSUES: The soft tissues are unremarkable. IMPRESSION: 1. No acute osseous abnormality. Electronically signed by: Pinkie Pebbles MD 12/21/2023 02:02 AM EDT RP Workstation: [id number]   CT Head Wo Contrast Result Date: 12/20/2023 EXAM: CT HEAD WITHOUT CONTRAST 12/20/2023 09:52:37 PM TECHNIQUE: CT of the head was performed without the administration of intravenous contrast. Automated exposure control, iterative reconstruction, and/or weight based adjustment of the mA/kV was utilized to reduce the radiation dose to as low as reasonably achievable. COMPARISON: 10/21/2023 CLINICAL HISTORY: Memory loss. Pt states multiple falls and hallucinations. FINDINGS: BRAIN AND VENTRICLES: No acute hemorrhage. No evidence of acute infarct. 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 Stable remote lacunar infarct in left lentiform nucleus. Stable white  matter changes compared to prior study. No hydrocephalus. No extra-axial collection. No mass effect or midline shift. Atherosclerotic calcifications in bilateral cavernous internal carotid arteries. ORBITS: Bilateral lens replacements noted. SINUSES: Fluid in posterior left ethmoid air cells. SOFT TISSUES AND SKULL: No acute soft tissue abnormality. No skull fracture. IMPRESSION: 1. No acute intracranial abnormality. Electronically signed by: Pinkie Pebbles MD 12/20/2023 09:56 PM EDT RP Workstation: [id number]   DG Chest Portable 1 View Result Date: 12/20/2023 CLINICAL DATA:  Shortness of breath.  Status post fall, pain. EXAM: PORTABLE CHEST 1 VIEW COMPARISON:  Radiograph 10/21/2023, CT 12/04/2023 FINDINGS: Hyperinflation and emphysema. Normal heart size with stable mediastinal contours. Aortic atherosclerosis. No confluent opacity, large pleural effusion or pneumothorax. No pulmonary edema. On limited assessment, no acute osseous findings. IMPRESSION: Hyperinflation and emphysema. No acute findings. Electronically Signed   By: Andrea Gasman M.D.   On: 12/20/2023 17:19   DG Pelvis 1-2 Views Result Date: 12/20/2023 CLINICAL DATA:  Pain after fall. EXAM: DG PELVIS 1-2V COMPARISON:  Radiograph 10/21/2023 FINDINGS: Femoral intramedullary nail and trans trochanteric screw fixation of previous intertrochanteric femur fracture. Interval fracture healing from initial injury radiograph. The hardware is intact were visualized. No evidence of acute pelvic fracture. Pubic rami are intact. No pubic symphyseal or sacroiliac diastasis. IMPRESSION: 1. No acute pelvic fracture. 2. ORIF of previous intertrochanteric femur fracture. Electronically Signed   By: Andrea Gasman M.D.   On: 12/20/2023 17:16   DG Tibia/Fibula Left Result Date: 12/20/2023 EXAM: 2 VIEW(S) XRAY OF THE LEFT TIBIA AND FIBULA 12/20/2023 04:46:00 PM COMPARISON: None available. CLINICAL HISTORY: Pain after fall. Fall w/ pain. FINDINGS: BONES AND  JOINTS: Degenerative changes of the knee and ankle. On the lateral view only, there is a subtle cortical defect along the anterior aspect of the lateral malleolus. This is not confirmed on any other view. The bones are osteopenic. No joint dislocation. SOFT TISSUES: The soft tissues are unremarkable. IMPRESSION: 1. Subtle cortical defect along the anterior aspect of the lateral malleolus on the lateral view only, not confirmed on other views. A subtle nondisplaced fracture cannot be excluded. If there is high clinical concern for an acute fracture at this level, recommend recommend dedicated images of the left ankle. 2. Osteopenia. Electronically signed by: Greig Pique MD 12/20/2023 05:16 PM EDT RP Workstation: [id number]  CT ANGIO CHEST/ABD/PEL FOR DISSECTION W &/OR WO CONTRAST Result Date: 12/08/2023 CLINICAL DATA:  Thoracic aortic aneurysm EXAM: CT ANGIOGRAPHY CHEST, ABDOMEN AND PELVIS TECHNIQUE: Non-contrast CT of the chest was initially obtained. Multidetector CT imaging through the chest, abdomen and pelvis was performed using the standard protocol during bolus administration of intravenous contrast. Multiplanar reconstructed images and MIPs were obtained and reviewed to evaluate the vascular anatomy. RADIATION DOSE REDUCTION: This exam was performed according to the departmental dose-optimization program which includes automated exposure control, adjustment of the mA and/or kV according to patient size and/or use of iterative reconstruction technique. CONTRAST:  75mL OMNIPAQUE  IOHEXOL  350 MG/ML SOLN COMPARISON:  10/21/2023 FINDINGS: CTA CHEST FINDINGS VASCULAR Aorta: Satisfactory opacification of the aorta. Severe thoracic aortic atherosclerosis with a large burden of irregular mural thrombus throughout the descending thoracic aorta. Tubular ascending thoracic aorta measures up to 3.9 x 3.8 cm. Descending thoracic aorta measures up to 3.4 x 3.3 cm. No evidence of dissection or other acute findings.  Cardiovascular: No evidence of pulmonary embolism on limited non-tailored examination. Normal heart size. Left and right coronary artery calcifications. No pericardial effusion. Review of the MIP images confirms the above findings. NON VASCULAR Mediastinum/Nodes: No enlarged mediastinal, hilar, or axillary lymph nodes. Thyroid  gland, trachea, and esophagus demonstrate no significant findings. Lungs/Pleura: Moderate centrilobular emphysema. No pleural effusion or pneumothorax. Musculoskeletal: No chest wall abnormality. No acute osseous findings. Multiple subacute, partially callused fractures of the anterolateral left ribs. Review of the MIP images confirms the above findings. CTA ABDOMEN AND PELVIS FINDINGS VASCULAR Normal caliber of the abdominal aorta. Severe, irregular abdominal aortic atherosclerosis. No evidence of aneurysm, dissection, or other acute aortic pathology. 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 The vessel remains patent through its proximal order branches. Review of the MIP images confirms the above findings. NON-VASCULAR Hepatobiliary: No solid liver abnormality is seen. No gallstones, gallbladder wall thickening, or biliary dilatation. Pancreas: Unremarkable. No pancreatic ductal dilatation or surrounding inflammatory changes. Spleen: Normal in size without significant abnormality. Adrenals/Urinary Tract: Adrenal glands are unremarkable. Severely atrophic left kidney. Multifocal scarring of the right renal cortex. No calculi or hydronephrosis. Bladder is unremarkable. Stomach/Bowel: Stomach is within normal limits. Appendix appears normal. No evidence of bowel wall thickening, distention, or inflammatory changes. Lymphatic: No enlarged abdominal or pelvic lymph nodes. Reproductive: Hysterectomy. Other: No  abdominal wall hernia or abnormality. No ascites. Musculoskeletal: No acute osseous findings. Intramedullary nail fixation of the proximal left femur with subacute, comminuted intratrochanteric fractures. IMPRESSION: 1. Severe thoracic and abdominal aortic atherosclerosis. 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 Tubular ascending thoracic aorta measures up to 3.9 x 3.8 cm. Descending thoracic aorta measures up to 3.4 x 3.3 cm. 2. No evidence of dissection or other acute findings. 3. 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 4. 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 The vessel remains patent through its proximal order branches. 5. Coronary artery disease. 6. Emphysema. Aortic Atherosclerosis (ICD10-I70.0) and Emphysema (ICD10-J43.9). Electronically Signed   By: Marolyn JONETTA Jaksch M.D.   On: 12/08/2023 17:47       The results of significant diagnostics from this hospitalization (including imaging, microbiology, ancillary and laboratory) are listed below for reference.  Microbiology: Recent Results (from the past 240 hours)  Resp panel by RT-PCR (RSV, Flu A&B, Covid) Anterior Nasal Swab     Status: None   Collection Time: 12/20/23  3:58 PM   Specimen: Anterior Nasal Swab  Result Value Ref Range Status   SARS Coronavirus 2 by RT PCR NEGATIVE NEGATIVE Final   Influenza A by PCR NEGATIVE NEGATIVE Final   Influenza B by PCR NEGATIVE NEGATIVE Final    Comment: (NOTE) The Xpert Xpress SARS-CoV-2/FLU/RSV plus assay is intended as an aid in the diagnosis of influenza from Nasopharyngeal swab specimens and should not be used as a sole basis for treatment. Nasal washings and aspirates are unacceptable for Xpert Xpress SARS-CoV-2/FLU/RSV testing.  Fact Sheet for  Patients: bloggercourse.com  Fact Sheet for Healthcare Providers: seriousbroker.it  This test is not yet approved or cleared by the [place]  FDA and has been authorized for detection and/or diagnosis of SARS-CoV-2 by FDA under an Emergency Use Authorization (EUA). This EUA will remain in effect (meaning this test can be used) for the duration of the COVID-19 declaration under Section 564(b)(1) of the Act, 21 U.S.C. section 360bbb-3(b)(1), unless the authorization is terminated or revoked.     Resp Syncytial Virus by PCR NEGATIVE NEGATIVE Final    Comment: (NOTE) Fact Sheet for Patients: bloggercourse.com  Fact Sheet for Healthcare Providers: seriousbroker.it  This test is not yet approved or cleared by the [place]  FDA and has been authorized for detection and/or diagnosis of SARS-CoV-2 by FDA under an Emergency Use Authorization (EUA). This EUA will remain in effect (meaning this test can be used) for the duration of the COVID-19 declaration under Section 564(b)(1) of the Act, 21 U.S.C. section 360bbb-3(b)(1), unless the authorization is terminated or revoked.  Performed at [facility], [address], [place], [name] [address]   Culture, blood (routine x 2)     Status: None   Collection Time: 12/20/23  7:57 PM   Specimen: BLOOD  Result Value Ref Range Status   Specimen Description BLOOD BLOOD RIGHT ARM  Final   Special Requests   Final    AEROBIC BOTTLE ONLY Blood Culture results may not be optimal due to an inadequate volume of blood received in culture bottles   Culture   Final    NO GROWTH 5 DAYS Performed at [facility], [address], [place], [name] [address]    Report Status 12/25/2023 FINAL  Final  Culture, blood (routine x 2)     Status: None   Collection Time: 12/20/23  7:57 PM   Specimen: BLOOD  Result Value Ref Range Status    Specimen Description BLOOD BLOOD LEFT ARM  Final   Special Requests   Final    BOTTLES DRAWN AEROBIC AND ANAEROBIC Blood Culture adequate volume   Culture   Final    NO GROWTH 5 DAYS Performed at [facility], [address] [place], [name] [address]    Report Status 12/25/2023 FINAL  Final  MRSA Next Gen by PCR, Nasal     Status: None   Collection Time: 12/21/23  8:40 AM   Specimen: Nasal Mucosa; Nasal Swab  Result Value Ref Range Status   MRSA by PCR Next Gen NOT DETECTED NOT DETECTED Final    Comment: (NOTE) The GeneXpert MRSA Assay (FDA approved for NASAL specimens only), is one component of a comprehensive MRSA colonization surveillance program. It is not intended to diagnose MRSA infection nor to guide or  monitor treatment for MRSA infections. Test performance is not FDA approved in patients less than 68 years old. Performed at [facility], [address], [place], [name] [address]   Urine Culture (for pregnant, neutropenic or urologic patients or patients with an indwelling urinary catheter)     Status: None   Collection Time: 12/23/23 11:07 AM   Specimen: Urine, Clean Catch  Result Value Ref Range Status   Specimen Description URINE, CLEAN CATCH  Final   Special Requests NONE  Final   Culture   Final    NO GROWTH Performed at [facility], [address] [place], [name] [address]    Report Status 12/24/2023 FINAL  Final     Labs:  CBC: Recent Labs  Lab 12/26/23 0355 12/28/23 0436  WBC 8.9 8.1  HGB 13.2 12.6  HCT 41.2 38.3  MCV 93.6 91.8  PLT 239 314   BMP &GFR Recent Labs  Lab 12/24/23 0249 12/25/23 0306 12/26/23 0355 12/27/23 0416 12/28/23 0436 12/28/23 0752  NA 133* 133* 135 134*  --  136  K 3.7 3.8 3.9 3.9  --  3.8  CL 97* 94* 100 100  --  101  CO2 27 27 23 25   --  23  GLUCOSE 96 111* 101* 102*  --  176*  BUN 13 17 25* 24*  --  31*  CREATININE 2.09* 1.95* 1.72* 1.57*  --  1.49*  CALCIUM  9.3 10.0 9.5 9.3  --  9.5  MG   --   --  1.8  --  2.0  --   PHOS  --   --  3.4 3.0  --  2.8   Estimated Creatinine Clearance: 33.3 mL/min (A) (by C-G formula based on SCr of 1.49 mg/dL (H)). Liver & Pancreas: Recent Labs  Lab 12/26/23 0355 12/27/23 0416 12/28/23 0752  ALBUMIN  3.0* 3.1* 3.0*   No results for input(s): LIPASE, AMYLASE in the last 168 hours. No results for input(s): AMMONIA in the last 168 hours. Diabetic: No results for input(s): HGBA1C in the last 72 hours. Recent Labs  Lab 12/24/23 1101 12/24/23 1603 12/24/23 2105 12/25/23 0553 12/25/23 1126  GLUCAP 99 109* 75 110* 94   Cardiac Enzymes: No results for input(s): CKTOTAL, CKMB, CKMBINDEX, TROPONINI in the last 168 hours. No results for input(s): PROBNP in the last 8760 hours. Coagulation Profile: No results for input(s): INR, PROTIME in the last 168 hours. Thyroid  Function Tests: No results for input(s): TSH, T4TOTAL, FREET4, T3FREE, THYROIDAB in the last 72 hours. Lipid Profile: No results for input(s): CHOL, HDL, LDLCALC, TRIG, CHOLHDL, LDLDIRECT in the last 72 hours. Anemia Panel: No results for input(s): VITAMINB12, FOLATE, FERRITIN, TIBC, IRON, RETICCTPCT in the last 72 hours. Urine analysis:    Component Value Date/Time   COLORURINE YELLOW 12/22/2023 1339   APPEARANCEUR TURBID (A) 12/22/2023 1339   LABSPEC 1.010 12/22/2023 1339   PHURINE 6.0 12/22/2023 1339   GLUCOSEU >=500 (A) 12/22/2023 1339   GLUCOSEU >=1000 (A) 05/15/2022 1554   HGBUR MODERATE (A) 12/22/2023 1339   BILIRUBINUR NEGATIVE 12/22/2023 1339   BILIRUBINUR negative 09/25/2022 1027   KETONESUR NEGATIVE 12/22/2023 1339   PROTEINUR 100 (A) 12/22/2023 1339   UROBILINOGEN 0.2 09/25/2022 1027   UROBILINOGEN 0.2 05/15/2022 1554   NITRITE NEGATIVE 12/22/2023 1339   LEUKOCYTESUR LARGE (A) 12/22/2023 1339   Sepsis Labs: Invalid input(s): PROCALCITONIN, LACTICIDVEN   SIGNED:  Mignon ONEIDA Bump, MD  [facility] 12/29/2023, 8:34 AM

## 2023-12-29 NOTE — Progress Notes (Signed)
 PROGRESS NOTE  Kerry Powell FMW:[id number] DOB: February 11, 1946   PCP: Geofm Glade PARAS, MD  Patient is from: Home  DOA: 12/20/2023 LOS: 9  Chief complaints Chief Complaint  Patient presents with   Shortness of Breath     Brief Narrative / Interim history: 78 y.o. 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     CXR with hyperinflation and emphysema.  Pelvic x-ray without acute finding.  Left tibial/fibular x-ray with septal cortical defect along the anterior aspect of the lateral malleolus on the lateral view only, and osteopenia.  Left ankle x-ray without acute finding.  CT head without acute finding.  Left femoral x-ray without acute finding or hardware complication but mild degenerative changes of medial compartment of the knee.  Lumbar film with multilevel degenerative change. D-dimer was elevated but VQ scan was negative for PE.  Lower extremity venous Doppler was negative for DVT.  Patient was treated for COPD exacerbation with nebulizers and inhalers with improvement in her oxygen requirement and breathing.   While in hospital, patient had a UA with large pyuria and microscopic hematuria, and started on IV ceftriaxone  for possible UTI.  Urine culture NGTD.  Completed 5 days of ceftriaxone  empirically.   Hospital course complicated by orthostatic hypotension with normal to high supine blood pressure.  Started midodrine, can compression wears and decreased pain meds.   Regards to respiratory failure, patient ambulated on room air and maintained appropriate saturation.   Therapy recommended SNF.  Medically stable for discharge.  Waiting on PASSR  Subjective: Seen and examined earlier this morning.  No major events overnight or this morning.  Continues to endorse left hip and left leg pain which is worse with cold weather.  Assessment and plan: Acute on chronic hypoxic respiratory failure- worsening shortness of breath, tachycardia, increased oxygen requirement.  DVT ruled out by negative VQ scan and lower extremity venous Doppler.  CXR suggested hyperinflation/emphysema.  Ambulated to room air and maintained appropriate saturation. - Continue home inhalers.   COPD exacerbation?  Presented with shortness of breath.  Did not require steroid -Continue inhalers as above.   Recurrent fall at home-trauma survey negative as above Left lower extremity pain-persistent pain since left femoral fracture for which she had IM nailing on 7/28.  She has tenderness over left trochanter but difficult to tell if the tenderness is due to instrumentation -Continue Tylenol , tramadol  and Voltaren gel. -Can try heating pad -Outpatient follow-up with orthopedic surgery as above. -Continue PT/OT   Orthostatic hypotension/history of essential hypertension: Orthostatic vitals positive.  Symptomatic. -Start midodrine 5 mg 3 times daily -Compression socks and abdominal binder -OOB, elevate head of bed.  Sitting on the edge of the bed. -Continue therapy.   Possible UTI-patient had some confusion and lower abdominal pain.  UA obtained and showed pyuria with hematuria.  Started on ceftriaxone .  Urine culture NGTD - Completed 5 days of ceftriaxone  on 10/31.   AKI on CKD-3B: Cr 2.18 (was 1.32 on 9/26).  Cr down to 1.5. - Avoid nephrotoxic meds - Discontinue diuretics.   Chronic HFpEF: TTE in 09/2023 with LVEF of 60 to 65%, G1 DD and normal RV SF.  Appears euvolemic on exam. -Discontinue Lasix  and Farxiga  due to risk of dehydration and AKI. -Monitor fluid and respiratory status.   History of CAD/PAD: Stable. -Continue home Toprol -XL,  aspirin, Plavix  and statin.   Delirium reportedly had confusion and hallucination likely from pain meds.  No formal diagnosis of cognitive impairment.  Only oriented x 4. -Minimize sedating medications   Controlled NIDDM-2: Last A1c 6.4% on 8/11.  On Farxiga  at home.  CBG within normal range for most part. -Discontinue CBG monitoring and SSI.   Ecchymosis: Skin process across lower abdomen and also on left thigh from prophylactic heparin  injection.  - Heparin  discontinued.  On Plavix  and aspirin which would be sufficient for VTE prophylaxis.   Hyperlipidemia -continue statin therapy.   Class I obesity Body mass index is 30.82 kg/m.           DVT prophylaxis:  Place TED hose Start: 12/26/23 1209  Code Status: DNR Family Communication: None at bedside Level of care: Med-Surg Status is: Inpatient Remains inpatient appropriate because: Orthostatic hypotension   Final disposition: SNF   35 minutes with more than 50% spent in reviewing records, counseling patient/family and coordinating care.  Consultants:  None  Procedures: None  Microbiology summarized: Blood cultures NGTD Urine cultures NGTD MRSA PCR screen nonreactive COVID-19, influenza and RSV PCR nonreactive  Objective: Vitals:   12/29/23 0500 12/29/23 0516 12/29/23 0805 12/29/23 0850  BP:  102/60 99/75   Pulse:  (!) 111 (!) 113   Resp:  20 12   Temp:  98 F (36.7 C)  (!) 95.1 F (35.1 C)  TempSrc:    Oral  SpO2:  99% 100%   Weight: 84 kg     Height:        Examination:  GENERAL: No apparent distress.  Nontoxic. HEENT: MMM.  Vision and hearing grossly intact.  NECK: Supple.  No apparent JVD.  RESP:  No IWOB.  Fair aeration bilaterally. CVS:  RRR. Heart sounds normal.  ABD/GI/GU: BS+. Abd soft, NTND.  MSK/EXT:  Moves extremities. Subtotal weakness in left leg.   SKIN: Bruising/ecchymosis across lower abdomen mainly on the left.  Bruising over left thigh. NEURO: AA.  Oriented appropriately.   Subtle weakness in left leg.  PSYCH: Calm. Normal affect.   Sch Meds:  Scheduled Meds:  acetaminophen   975 mg Oral Q6H WA   allopurinol   300 mg Oral Daily   aspirin EC  81 mg Oral Daily   clopidogrel   75 mg Oral Daily   diclofenac Sodium  2 g Topical QID   DULoxetine   60 mg Oral Daily   fluticasone  furoate-vilanterol  1 puff Inhalation Daily   metoprolol  succinate  25 mg Oral Daily   midodrine  5 mg Oral TID WC   potassium chloride  SA  20 mEq Oral Daily   rosuvastatin   10 mg Oral Daily   sodium chloride  flush  3 mL Intravenous Q12H   umeclidinium bromide   1 puff Inhalation Daily   Continuous Infusions:   PRN Meds:.alum & mag hydroxide-simeth, guaiFENesin -dextromethorphan, ipratropium-albuterol , melatonin, ondansetron  (ZOFRAN ) IV, oxyCODONE , polyethylene glycol, traMADol   Antimicrobials: Anti-infectives (From admission, onward)    Start     Dose/Rate Route Frequency Ordered Stop   12/23/23 1000  cefTRIAXone  (ROCEPHIN ) 2 g in sodium chloride  0.9 % 100 mL IVPB  Status:  Discontinued        2 g 200 mL/hr over 30 Minutes Intravenous Every 24 hours 12/23/23 0843 12/27/23 1352        I  have personally reviewed the following labs and images: CBC: Recent Labs  Lab 12/26/23 0355 12/28/23 0436  WBC 8.9 8.1  HGB 13.2 12.6  HCT 41.2 38.3  MCV 93.6 91.8  PLT 239 314   BMP &GFR Recent Labs  Lab 12/24/23 0249 12/25/23 0306 12/26/23 0355 12/27/23 0416 12/28/23 0436 12/28/23 0752  NA 133* 133* 135 134*  --  136  K 3.7 3.8 3.9 3.9  --  3.8  CL 97* 94* 100 100  --  101  CO2 27 27 23 25   --  23  GLUCOSE 96 111* 101* 102*  --  176*  BUN 13 17 25* 24*  --  31*  CREATININE 2.09* 1.95* 1.72* 1.57*  --  1.49*  CALCIUM  9.3 10.0 9.5 9.3  --  9.5  MG  --   --  1.8  --  2.0  --   PHOS  --   --  3.4 3.0  --  2.8   Estimated Creatinine Clearance: 33.3 mL/min (A) (by C-G formula based on SCr of 1.49 mg/dL (H)). Liver & Pancreas: Recent Labs  Lab 12/26/23 0355 12/27/23 0416  12/28/23 0752  ALBUMIN  3.0* 3.1* 3.0*   No results for input(s): LIPASE, AMYLASE in the last 168 hours. No results for input(s): AMMONIA in the last 168 hours. Diabetic: No results for input(s): HGBA1C in the last 72 hours. Recent Labs  Lab 12/24/23 1101 12/24/23 1603 12/24/23 2105 12/25/23 0553 12/25/23 1126  GLUCAP 99 109* 75 110* 94   Cardiac Enzymes: No results for input(s): CKTOTAL, CKMB, CKMBINDEX, TROPONINI in the last 168 hours. No results for input(s): PROBNP in the last 8760 hours. Coagulation Profile: No results for input(s): INR, PROTIME in the last 168 hours.  Thyroid  Function Tests: No results for input(s): TSH, T4TOTAL, FREET4, T3FREE, THYROIDAB in the last 72 hours. Lipid Profile: No results for input(s): CHOL, HDL, LDLCALC, TRIG, CHOLHDL, LDLDIRECT in the last 72 hours. Anemia Panel: No results for input(s): VITAMINB12, FOLATE, FERRITIN, TIBC, IRON, RETICCTPCT in the last 72 hours. Urine analysis:    Component Value Date/Time   COLORURINE YELLOW 12/22/2023 1339   APPEARANCEUR TURBID (A) 12/22/2023 1339   LABSPEC 1.010 12/22/2023 1339   PHURINE 6.0 12/22/2023 1339   GLUCOSEU >=500 (A) 12/22/2023 1339   GLUCOSEU >=1000 (A) 05/15/2022 1554   HGBUR MODERATE (A) 12/22/2023 1339   BILIRUBINUR NEGATIVE 12/22/2023 1339   BILIRUBINUR negative 09/25/2022 1027   KETONESUR NEGATIVE 12/22/2023 1339   PROTEINUR 100 (A) 12/22/2023 1339   UROBILINOGEN 0.2 09/25/2022 1027   UROBILINOGEN 0.2 05/15/2022 1554   NITRITE NEGATIVE 12/22/2023 1339   LEUKOCYTESUR LARGE (A) 12/22/2023 1339   Sepsis Labs: Invalid input(s): PROCALCITONIN, LACTICIDVEN  Microbiology: Recent Results (from the past 240 hours)  Resp panel by RT-PCR (RSV, Flu A&B, Covid) Anterior Nasal Swab     Status: None   Collection Time: 12/20/23  3:58 PM   Specimen: Anterior Nasal Swab  Result Value Ref Range Status   SARS Coronavirus 2 by RT  PCR NEGATIVE NEGATIVE Final   Influenza A by PCR NEGATIVE NEGATIVE Final   Influenza B by PCR NEGATIVE NEGATIVE Final    Comment: (NOTE) The Xpert Xpress SARS-CoV-2/FLU/RSV plus assay is intended as an aid in the diagnosis of influenza from Nasopharyngeal swab specimens and should not be used as a sole basis for treatment. Nasal washings and aspirates are unacceptable for Xpert Xpress SARS-CoV-2/FLU/RSV testing.  Fact Sheet for Patients: bloggercourse.com  Fact Sheet for Healthcare Providers: seriousbroker.it  This test is not yet approved or cleared by the [place]  FDA and has been authorized for detection and/or diagnosis of SARS-CoV-2 by FDA under an Emergency Use Authorization (EUA). This EUA will remain in effect (meaning this test can be used) for the duration of the COVID-19 declaration under Section 564(b)(1) of the Act, 21 U.S.C. section 360bbb-3(b)(1), unless the authorization is terminated or revoked.     Resp Syncytial Virus by PCR NEGATIVE NEGATIVE Final    Comment: (NOTE) Fact Sheet for Patients: bloggercourse.com  Fact Sheet for Healthcare Providers: seriousbroker.it  This test is not yet approved or cleared by the [place]  FDA and has been authorized for detection and/or diagnosis of SARS-CoV-2 by FDA under an Emergency Use Authorization (EUA). This EUA will remain in effect (meaning this test can be used) for the duration of the COVID-19 declaration under Section 564(b)(1) of the Act, 21 U.S.C. section 360bbb-3(b)(1), unless the authorization is terminated or revoked.  Performed at [facility], [address] [place], [name] [address]   Culture, blood (routine x 2)     Status: None   Collection Time: 12/20/23  7:57 PM   Specimen: BLOOD  Result Value Ref Range Status   Specimen Description BLOOD BLOOD RIGHT ARM  Final   Special Requests    Final    AEROBIC BOTTLE ONLY Blood Culture results may not be optimal due to an inadequate volume of blood received in culture bottles   Culture   Final    NO GROWTH 5 DAYS Performed at [facility], [address], [place], [name] [address]    Report Status 12/25/2023 FINAL  Final  Culture, blood (routine x 2)     Status: None   Collection Time: 12/20/23  7:57 PM   Specimen: BLOOD  Result Value Ref Range Status   Specimen Description BLOOD BLOOD LEFT ARM  Final   Special Requests   Final    BOTTLES DRAWN AEROBIC AND ANAEROBIC Blood Culture adequate volume   Culture   Final    NO GROWTH 5 DAYS Performed at [facility], [address] [place], [name] [address]    Report Status 12/25/2023 FINAL  Final  MRSA Next Gen by PCR, Nasal     Status: None   Collection Time: 12/21/23  8:40 AM   Specimen: Nasal Mucosa; Nasal Swab  Result Value Ref Range Status   MRSA by PCR Next Gen NOT DETECTED NOT DETECTED Final    Comment: (NOTE) The GeneXpert MRSA Assay (FDA approved for NASAL specimens only), is one component of a comprehensive MRSA colonization surveillance program. It is not intended to diagnose MRSA infection nor to guide or monitor treatment for MRSA infections. Test performance is not FDA approved in patients less than 63 years old. Performed at [facility], [address], [place], [name] [address]   Urine Culture (for pregnant, neutropenic or urologic patients or patients with an indwelling urinary catheter)     Status: None   Collection Time: 12/23/23 11:07 AM   Specimen: Urine, Clean Catch  Result Value Ref Range Status   Specimen Description URINE, CLEAN CATCH  Final   Special Requests NONE  Final   Culture   Final    NO GROWTH Performed at [facility], [address] [place], [name] [address]    Report Status 12/24/2023 FINAL  Final    Radiology Studies: No results found.    Abdullah Rizzi T. Marlaya Turck [facility]  If 7PM-7AM, please  contact night-coverage [email or web address] 12/29/2023, 11:13 AM

## 2023-12-29 NOTE — TOC Progression Note (Signed)
 Transition of Care [facility]) - Progression Note    Patient Details  Name: Kerry Powell MRN: [id number] Date of Birth: Jan 08, 1946  [facility] of [facility]) CM/SW Contact  Isaiah Public, LCSWA Phone Number: 12/29/2023, 1:12 PM  Clinical Narrative:     Patient has SNF bed at [facility]. PASSR currently pending. SNF auth approved 10/30- 11/3. Auth# [id number]. CSW informed MD passr currently pending.     Expected Discharge Plan: Skilled Nursing Facility Barriers to Discharge: Awaiting State Approval (PASRR)               Expected Discharge Plan and Services In-house Referral: Clinical Social Work   Post Acute Care Choice: Skilled Nursing Facility Living arrangements for the past 2 months: Single Family Home Expected Discharge Date: 12/28/23                                     Social Drivers of Health (SDOH) Interventions SDOH Screenings   Food Insecurity: No Food Insecurity (12/21/2023)  Housing: Low Risk  (12/21/2023)  Transportation Needs: No Transportation Needs (12/21/2023)  Utilities: Not At Risk (12/21/2023)  Alcohol  Screen: Low Risk  (05/15/2023)  Depression (PHQ2-9): Low Risk  (11/20/2023)  Financial Resource Strain: Low Risk  (12/08/2023)  Physical Activity: Inactive (12/08/2023)  Social Connections: Unknown (12/24/2023)  Recent Concern: Social Connections - Socially Isolated (12/21/2023)  Stress: Stress Concern Present (12/08/2023)  Tobacco Use: Medium Risk (12/23/2023)  Health Literacy: Adequate Health Literacy (05/15/2023)    Readmission Risk Interventions     No data to display

## 2023-12-30 ENCOUNTER — Telehealth: Payer: Self-pay

## 2023-12-30 ENCOUNTER — Encounter: Payer: Self-pay | Admitting: Radiology

## 2023-12-30 DIAGNOSIS — J9601 Acute respiratory failure with hypoxia: Secondary | ICD-10-CM | POA: Diagnosis not present

## 2023-12-30 DIAGNOSIS — M79605 Pain in left leg: Secondary | ICD-10-CM | POA: Diagnosis not present

## 2023-12-30 DIAGNOSIS — R296 Repeated falls: Secondary | ICD-10-CM | POA: Diagnosis not present

## 2023-12-30 LAB — RENAL FUNCTION PANEL
Albumin: 3.5 g/dL (ref 3.5–5.0)
Anion gap: 12 (ref 5–15)
BUN: 39 mg/dL — ABNORMAL HIGH (ref 8–23)
CO2: 21 mmol/L — ABNORMAL LOW (ref 22–32)
Calcium: 9.5 mg/dL (ref 8.9–10.3)
Chloride: 101 mmol/L (ref 98–111)
Creatinine, Ser: 1.63 mg/dL — ABNORMAL HIGH (ref 0.44–1.00)
GFR, Estimated: 32 mL/min — ABNORMAL LOW (ref >60)
Glucose, Bld: 93 mg/dL (ref 70–99)
Phosphorus: 4.1 mg/dL (ref 2.5–4.6)
Potassium: 4.2 mmol/L (ref 3.5–5.1)
Sodium: 134 mmol/L — ABNORMAL LOW (ref 135–145)

## 2023-12-30 NOTE — Progress Notes (Signed)
 Physical Therapy Treatment Patient Details Name: Kerry Powell MRN: [id number] DOB: 10/07/1945 Today's Date: 12/30/2023   History of Present Illness Pt is 78 yo presenting to [facility] on 10/24 due to AMS and increased shortness of breath with exertion and increased O2 demands. PMH includes Recent IMN of the L femur on 8/28, DMII, CKD, HFpEF, COPD.    PT Comments  Pt resting in bed on arrival, pleasant and agreeable to session. Pt continues to be limited in safe mobility by symptomatic orthostatic hypotension with pt SBP dropping 41 mmHg from sitting>standing. Pt unable to tolerate ambulation due to dizziness and pt feeling shaky. Pt performing supine exercises and verbalizing understanding of continued education on exercises to complete between therapies, importance of upright and OOB sitting for BP compliance and taking time between positional changes. RN updated and aware of BP readings and pt abdominal binder donned throughout session. Pt continues to benefit from skilled PT services to progress toward functional mobility goals.     If plan is discharge home, recommend the following: Assist for transportation;Help with stairs or ramp for entrance;Assistance with cooking/housework   Can travel by private vehicle     Yes  Equipment Recommendations  None recommended by PT    Recommendations for Other Services       Precautions / Restrictions Precautions Precautions: Fall Recall of Precautions/Restrictions: Intact Precaution/Restrictions Comments: watch BP Restrictions Weight Bearing Restrictions Per Provider Order: No     Mobility  Bed Mobility Overal bed mobility: Needs Assistance Bed Mobility: Sit to Supine, Supine to Sit     Supine to sit: Supervision Sit to supine: Supervision   General bed mobility comments: supervision for safety    Transfers Overall transfer level: Needs assistance Equipment used: Rolling walker (2 wheels) Transfers: Sit to/from Stand, Bed to  chair/wheelchair/BSC Sit to Stand: Contact guard assist           General transfer comment: CGA for safety, steady rise x2 during session    Ambulation/Gait               General Gait Details: unable to progress today due to BP pt reporting dizziness in standing. BP 71/58 (63) in standing   Stairs             Wheelchair Mobility     Tilt Bed    Modified Rankin (Stroke Patients Only)       Balance Overall balance assessment: Needs assistance Sitting-balance support: Feet supported, No upper extremity supported Sitting balance-Leahy Scale: Fair     Standing balance support: Bilateral upper extremity supported, During functional activity, Reliant on assistive device for balance Standing balance-Leahy Scale: Poor Standing balance comment: reliant on RW                            Communication Communication Communication: No apparent difficulties  Cognition Arousal: Alert Behavior During Therapy: WFL for tasks assessed/performed   PT - Cognitive impairments: Awareness, Memory                         Following commands: Intact      Cueing Cueing Techniques: Verbal cues  Exercises Other Exercises Other Exercises: supine bridge x15    General Comments General comments (skin integrity, edema, etc.): BP seated EOB 112/67 (81) standing 71/58 (63), supine at end of session 119/69      Pertinent Vitals/Pain Pain Assessment Pain Assessment: Faces Faces Pain Scale: Hurts a  little bit Pain Location: L LE Pain Descriptors / Indicators: Grimacing, Guarding, Discomfort, Sharp Pain Intervention(s): Monitored during session, Limited activity within patient's tolerance    Home Living                          Prior Function            PT Goals (current goals can now be found in the care plan section) Acute Rehab PT Goals PT Goal Formulation: With patient Time For Goal Achievement: 01/05/24 Progress towards PT goals: Not  progressing toward goals - comment (orthostasis)    Frequency    Min 2X/week      PT Plan      Co-evaluation              AM-PAC PT 6 Clicks Mobility   Outcome Measure  Help needed turning from your back to your side while in a flat bed without using bedrails?: A Little Help needed moving from lying on your back to sitting on the side of a flat bed without using bedrails?: A Little Help needed moving to and from a bed to a chair (including a wheelchair)?: A Little Help needed standing up from a chair using your arms (e.g., wheelchair or bedside chair)?: A Little Help needed to walk in hospital room?: A Little Help needed climbing 3-5 steps with a railing? : A Lot 6 Click Score: 17    End of Session Equipment Utilized During Treatment: Oxygen Activity Tolerance: Patient tolerated treatment well;Patient limited by fatigue Patient left: with call bell/phone within reach;in bed Nurse Communication: Mobility status (BP readings) PT Visit Diagnosis: Unsteadiness on feet (R26.81);Other abnormalities of gait and mobility (R26.89);Pain;Muscle weakness (generalized) (M62.81) Pain - Right/Left: Left Pain - part of body: Hip;Knee     Time: 8497-8471 PT Time Calculation (min) (ACUTE ONLY): 26 min  Charges:    $Therapeutic Activity: 23-37 mins PT General Charges $$ ACUTE PT VISIT: 1 Visit                     Nkenge Sonntag R. PTA Acute Rehabilitation Services Office: [phone]   Therisa CHRISTELLA Boor 12/30/2023, 5:02 PM

## 2023-12-30 NOTE — TOC Progression Note (Signed)
 Transition of Care [facility]) - Progression Note    Patient Details  Name: Kerry Powell MRN: [id number] Date of Birth: 10-14-1945  [facility]) CM/SW Contact  Bridget Cordella Simmonds, LCSW Phone Number: 12/30/2023, 8:37 AM  Clinical Narrative:   PASSR remains pending.     Expected Discharge Plan: Skilled Nursing Facility Barriers to Discharge: Awaiting State Approval (PASRR)               Expected Discharge Plan and Services In-house Referral: Clinical Social Work   Post Acute Care Choice: Skilled Nursing Facility Living arrangements for the past 2 months: Single Family Home Expected Discharge Date: 12/28/23                                     Social Drivers of Health (SDOH) Interventions SDOH Screenings   Food Insecurity: No Food Insecurity (12/21/2023)  Housing: Low Risk  (12/21/2023)  Transportation Needs: No Transportation Needs (12/21/2023)  Utilities: Not At Risk (12/21/2023)  Alcohol  Screen: Low Risk  (05/15/2023)  Depression (PHQ2-9): Low Risk  (11/20/2023)  Financial Resource Strain: Low Risk  (12/08/2023)  Physical Activity: Inactive (12/08/2023)  Social Connections: Unknown (12/24/2023)  Recent Concern: Social Connections - Socially Isolated (12/21/2023)  Stress: Stress Concern Present (12/08/2023)  Tobacco Use: Medium Risk (12/23/2023)  Health Literacy: Adequate Health Literacy (05/15/2023)    Readmission Risk Interventions     No data to display

## 2023-12-30 NOTE — Progress Notes (Signed)
 PROGRESS NOTE  Kerry Powell FMW:[id number] DOB: [birth year]   PCP: Geofm Glade PARAS, MD  Patient is from: Home  DOA: 12/20/2023 LOS: 10  Chief complaints Chief Complaint  Patient presents with   Shortness of Breath     Brief Narrative / Interim history: 78 y.o. 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     CXR with hyperinflation and emphysema.  Pelvic x-ray without acute finding.  Left tibial/fibular x-ray with septal cortical defect along the anterior aspect of the lateral malleolus on the lateral view only, and osteopenia.  Left ankle x-ray without acute finding.  CT head without acute finding.  Left femoral x-ray without acute finding or hardware complication but mild degenerative changes of medial compartment of the knee.  Lumbar film with multilevel degenerative change. D-dimer was elevated but VQ scan was negative for PE.  Lower extremity venous Doppler was negative for DVT.  Patient was treated for COPD exacerbation with nebulizers and inhalers with improvement in her oxygen requirement and breathing.   While in hospital, patient had a UA with large pyuria and microscopic hematuria, and started on IV ceftriaxone  for possible UTI.  Urine culture NGTD.  Completed 5 days of ceftriaxone  empirically.   Hospital course complicated by orthostatic hypotension with normal to high supine blood pressure.  Started midodrine, can compression wears and decreased pain meds.   Regards to respiratory failure, patient ambulated on room air and maintained appropriate saturation.   Therapy recommended SNF.  Medically stable for discharge.  Waiting on PASSR  Subjective: Seen and examined earlier this morning.  No major events overnight or this morning.  Continues to endorse left hip and left leg pain which is worse with cold weather.  Sitting on the edge of the bed.  Denies dizziness.  Assessment and plan: Acute on chronic hypoxic respiratory failure- worsening shortness of breath, tachycardia, increased oxygen requirement.  DVT ruled out by negative VQ scan and lower extremity venous Doppler.  CXR suggested hyperinflation/emphysema.  Ambulated to room air and maintained appropriate saturation. - Continue home inhalers.   COPD exacerbation?  Presented with shortness of breath.  Did not require steroid -Continue inhalers as above.   Recurrent fall at home-trauma survey negative as above Left lower extremity pain-persistent pain since left femoral fracture for which she had IM nailing on 7/28.  She has tenderness over left trochanter but difficult to tell if the tenderness is due to instrumentation -Continue heating pad, Tylenol , tramadol  and Voltaren gel. -Outpatient follow-up with orthopedic surgery as above. -Continue PT/OT   Orthostatic hypotension/history of essential hypertension: Orthostatic vitals positive.  Symptomatic.  BP improved. -Continue midodrine 5 mg 3 times daily -Compression socks and abdominal binder -OOB, elevate head of bed.  Sitting on the edge of the bed. -Continue therapy.   Possible UTI-patient had some confusion and lower abdominal pain.  UA obtained and showed pyuria with hematuria.  Started on ceftriaxone .  Urine culture NGTD -Completed 5 days of ceftriaxone  on 10/31.   AKI on CKD-3B: Cr 2.18 (was 1.32 on 9/26).  Cr down to 1.5. - Avoid nephrotoxic meds - Discontinue diuretics.   Chronic HFpEF: TTE in 09/2023 with LVEF  of 60 to 65%, G1 DD and normal RV SF.  Appears euvolemic on exam. -Discontinue Lasix  and Farxiga  due to risk of dehydration and AKI. -Monitor fluid and respiratory status.    History of CAD/PAD: Stable. -Continue home Toprol -XL, aspirin, Plavix  and statin.   Delirium reportedly had confusion and hallucination likely from pain meds.  No formal diagnosis of cognitive impairment.  Only oriented x 4. -Minimize sedating medications   Controlled NIDDM-2: Last A1c 6.4% on 8/11.  On Farxiga  at home.  CBG within normal range for most part. -Discontinue CBG monitoring and SSI.   Ecchymosis: Skin process across lower abdomen and also on left thigh from prophylactic heparin  injection.  - Heparin  discontinued.  On Plavix  and aspirin which would be sufficient for VTE prophylaxis.   Hyperlipidemia -continue statin therapy.   Class I obesity Body mass index is 30.82 kg/m.           DVT prophylaxis:  Place TED hose Start: 12/26/23 1209  Code Status: DNR Family Communication: None at bedside Level of care: Med-Surg Status is: Inpatient Remains inpatient appropriate because: Orthostatic hypotension   Final disposition: SNF.  Waiting on PASSR.   35 minutes with more than 50% spent in reviewing records, counseling patient/family and coordinating care.  Consultants:  None  Procedures: None  Microbiology summarized: Blood cultures NGTD Urine cultures NGTD MRSA PCR screen nonreactive COVID-19, influenza and RSV PCR nonreactive  Objective: Vitals:   12/29/23 1600 12/29/23 1955 12/30/23 0414 12/30/23 0751  BP: 119/77 124/68 (!) 119/59 115/74  Pulse: 87 100 100 (!) 107  Resp: 18 15 15 18   Temp: 97.7 F (36.5 C) (!) 97.5 F (36.4 C) (!) 97.4 F (36.3 C) 97.6 F (36.4 C)  TempSrc: Oral     SpO2: 98% 99% 96% 97%  Weight:      Height:        Examination:  GENERAL: No apparent distress.  Nontoxic. HEENT: MMM.  Vision and hearing grossly intact.  NECK: Supple.  No apparent JVD.  RESP:  No IWOB.  Fair aeration bilaterally. CVS:  RRR. Heart sounds normal.  ABD/GI/GU: BS+. Abd soft, NTND.  MSK/EXT:  Moves extremities. Subtotal weakness in left  leg.   SKIN: Bruising/ecchymosis across lower abdomen mainly on the left.  Bruising over left thigh. NEURO: AA.  Oriented appropriately.  Subtle weakness in left leg.  PSYCH: Calm. Normal affect.   Sch Meds:  Scheduled Meds:  acetaminophen   975 mg Oral Q6H WA   allopurinol   300 mg Oral Daily   aspirin EC  81 mg Oral Daily   clopidogrel   75 mg Oral Daily   diclofenac Sodium  2 g Topical QID   DULoxetine   60 mg Oral Daily   fluticasone  furoate-vilanterol  1 puff Inhalation Daily   metoprolol  succinate  25 mg Oral Daily   midodrine  5 mg Oral TID WC   potassium chloride  SA  20 mEq Oral Daily   rosuvastatin   10 mg Oral Daily   sodium chloride  flush  3 mL Intravenous Q12H   umeclidinium bromide   1 puff Inhalation Daily   Continuous Infusions:   PRN Meds:.alum & mag hydroxide-simeth, guaiFENesin -dextromethorphan, ipratropium-albuterol , melatonin, ondansetron  (ZOFRAN ) IV, oxyCODONE , polyethylene glycol, traMADol   Antimicrobials: Anti-infectives (From admission, onward)    Start     Dose/Rate Route Frequency Ordered Stop   12/23/23 1000  cefTRIAXone  (ROCEPHIN ) 2 g in sodium chloride  0.9 % 100 mL IVPB  Status:  Discontinued        2 g  200 mL/hr over 30 Minutes Intravenous Every 24 hours 12/23/23 0843 12/27/23 1352        I have personally reviewed the following labs and images: CBC: Recent Labs  Lab 12/26/23 0355 12/28/23 0436  WBC 8.9 8.1  HGB 13.2 12.6  HCT 41.2 38.3  MCV 93.6 91.8  PLT 239 314   BMP &GFR Recent Labs  Lab 12/24/23 0249 12/25/23 0306 12/26/23 0355 12/27/23 0416 12/28/23 0436 12/28/23 0752  NA 133* 133* 135 134*  --  136  K 3.7 3.8 3.9 3.9  --  3.8  CL 97* 94* 100 100  --  101  CO2 27 27 23 25   --  23  GLUCOSE 96 111* 101* 102*  --  176*  BUN 13 17 25* 24*  --  31*  CREATININE 2.09* 1.95* 1.72* 1.57*  --  1.49*  CALCIUM  9.3 10.0 9.5 9.3  --  9.5  MG  --   --  1.8  --  2.0  --   PHOS  --   --  3.4 3.0  --  2.8   Estimated Creatinine  Clearance: 33.3 mL/min (A) (by C-G formula based on SCr of 1.49 mg/dL (H)). Liver & Pancreas: Recent Labs  Lab 12/26/23 0355 12/27/23 0416 12/28/23 0752  ALBUMIN  3.0* 3.1* 3.0*   No results for input(s): LIPASE, AMYLASE in the last 168 hours. No results for input(s): AMMONIA in the last 168 hours. Diabetic: No results for input(s): HGBA1C in the last 72 hours. Recent Labs  Lab 12/24/23 1101 12/24/23 1603 12/24/23 2105 12/25/23 0553 12/25/23 1126  GLUCAP 99 109* 75 110* 94   Cardiac Enzymes: No results for input(s): CKTOTAL, CKMB, CKMBINDEX, TROPONINI in the last 168 hours. No results for input(s): PROBNP in the last 8760 hours. Coagulation Profile: No results for input(s): INR, PROTIME in the last 168 hours.  Thyroid  Function Tests: No results for input(s): TSH, T4TOTAL, FREET4, T3FREE, THYROIDAB in the last 72 hours. Lipid Profile: No results for input(s): CHOL, HDL, LDLCALC, TRIG, CHOLHDL, LDLDIRECT in the last 72 hours. Anemia Panel: No results for input(s): VITAMINB12, FOLATE, FERRITIN, TIBC, IRON, RETICCTPCT in the last 72 hours. Urine analysis:    Component Value Date/Time   COLORURINE YELLOW 12/22/2023 1339   APPEARANCEUR TURBID (A) 12/22/2023 1339   LABSPEC 1.010 12/22/2023 1339   PHURINE 6.0 12/22/2023 1339   GLUCOSEU >=500 (A) 12/22/2023 1339   GLUCOSEU >=1000 (A) 05/15/2022 1554   HGBUR MODERATE (A) 12/22/2023 1339   BILIRUBINUR NEGATIVE 12/22/2023 1339   BILIRUBINUR negative 09/25/2022 1027   KETONESUR NEGATIVE 12/22/2023 1339   PROTEINUR 100 (A) 12/22/2023 1339   UROBILINOGEN 0.2 09/25/2022 1027   UROBILINOGEN 0.2 05/15/2022 1554   NITRITE NEGATIVE 12/22/2023 1339   LEUKOCYTESUR LARGE (A) 12/22/2023 1339   Sepsis Labs: Invalid input(s): PROCALCITONIN, LACTICIDVEN  Microbiology: Recent Results (from the past 240 hours)  Resp panel by RT-PCR (RSV, Flu A&B, Covid) Anterior Nasal Swab      Status: None   Collection Time: 12/20/23  3:58 PM   Specimen: Anterior Nasal Swab  Result Value Ref Range Status   SARS Coronavirus 2 by RT PCR NEGATIVE NEGATIVE Final   Influenza A by PCR NEGATIVE NEGATIVE Final   Influenza B by PCR NEGATIVE NEGATIVE Final    Comment: (NOTE) The Xpert Xpress SARS-CoV-2/FLU/RSV plus assay is intended as an aid in the diagnosis of influenza from Nasopharyngeal swab specimens and should not be used as a sole basis for treatment. Nasal washings and  aspirates are unacceptable for Xpert Xpress SARS-CoV-2/FLU/RSV testing.  Fact Sheet for Patients: bloggercourse.com  Fact Sheet for Healthcare Providers: seriousbroker.it  This test is not yet approved or cleared by the [place]  FDA and has been authorized for detection and/or diagnosis of SARS-CoV-2 by FDA under an Emergency Use Authorization (EUA). This EUA will remain in effect (meaning this test can be used) for the duration of the COVID-19 declaration under Section 564(b)(1) of the Act, 21 U.S.C. section 360bbb-3(b)(1), unless the authorization is terminated or revoked.     Resp Syncytial Virus by PCR NEGATIVE NEGATIVE Final    Comment: (NOTE) Fact Sheet for Patients: bloggercourse.com  Fact Sheet for Healthcare Providers: seriousbroker.it  This test is not yet approved or cleared by the [place]  FDA and has been authorized for detection and/or diagnosis of SARS-CoV-2 by FDA under an Emergency Use Authorization (EUA). This EUA will remain in effect (meaning this test can be used) for the duration of the COVID-19 declaration under Section 564(b)(1) of the Act, 21 U.S.C. section 360bbb-3(b)(1), unless the authorization is terminated or revoked.  Performed at [facility], [address], [place], [name] [address]   Culture, blood (routine x 2)     Status: None   Collection  Time: 12/20/23  7:57 PM   Specimen: BLOOD  Result Value Ref Range Status   Specimen Description BLOOD BLOOD RIGHT ARM  Final   Special Requests   Final    AEROBIC BOTTLE ONLY Blood Culture results may not be optimal due to an inadequate volume of blood received in culture bottles   Culture   Final    NO GROWTH 5 DAYS Performed at [facility], [address], [place], [name] [address]    Report Status 12/25/2023 FINAL  Final  Culture, blood (routine x 2)     Status: None   Collection Time: 12/20/23  7:57 PM   Specimen: BLOOD  Result Value Ref Range Status   Specimen Description BLOOD BLOOD LEFT ARM  Final   Special Requests   Final    BOTTLES DRAWN AEROBIC AND ANAEROBIC Blood Culture adequate volume   Culture   Final    NO GROWTH 5 DAYS Performed at [facility], [address] [place], [name] [address]    Report Status 12/25/2023 FINAL  Final  MRSA Next Gen by PCR, Nasal     Status: None   Collection Time: 12/21/23  8:40 AM   Specimen: Nasal Mucosa; Nasal Swab  Result Value Ref Range Status   MRSA by PCR Next Gen NOT DETECTED NOT DETECTED Final    Comment: (NOTE) The GeneXpert MRSA Assay (FDA approved for NASAL specimens only), is one component of a comprehensive MRSA colonization surveillance program. It is not intended to diagnose MRSA infection nor to guide or monitor treatment for MRSA infections. Test performance is not FDA approved in patients less than 54 years old. Performed at [facility], [address], [place], [name] [address]   Urine Culture (for pregnant, neutropenic or urologic patients or patients with an indwelling urinary catheter)     Status: None   Collection Time: 12/23/23 11:07 AM   Specimen: Urine, Clean Catch  Result Value Ref Range Status   Specimen Description URINE, CLEAN CATCH  Final   Special Requests NONE  Final   Culture   Final    NO GROWTH Performed at [facility], [address], [place], [name]  [address]  Report Status 12/24/2023 FINAL  Final    Radiology Studies: No results found.    Ozell Ferrera T. Medora Roorda [facility]  If 7PM-7AM, please contact night-coverage [email or web address] 12/30/2023, 11:15 AM

## 2023-12-30 NOTE — Progress Notes (Signed)
 Mobility Specialist Progress Note:    12/30/23 0900  Orthostatic Lying   BP- Lying  (Pt on EOB upon arrival)  Orthostatic Sitting  BP- Sitting 95/66  Pulse- Sitting 106  Orthostatic Standing at 0 minutes  BP- Standing at 0 minutes 125/72  Pulse- Standing at 0 minutes 115  Orthostatic Standing at 3 minutes  BP- Standing at 3 minutes 90/74  Pulse- Standing at 3 minutes 117  Mobility  Activity Pivoted/transferred from bed to chair  Level of Assistance Contact guard assist, steadying assist  Assistive Device Front wheel walker  Distance Ambulated (ft) 5 ft  Activity Response Tolerated well  Mobility Referral Yes  Mobility visit 1 Mobility  Mobility Specialist Start Time (ACUTE ONLY) [id number]  Mobility Specialist Stop Time (ACUTE ONLY) [id number]  Mobility Specialist Time Calculation (min) (ACUTE ONLY) 15 min   Received pt EOB and agreeable to mobility. Pt c/o back pain, otherwise tolerated well. No dizziness noted, but took orthostatics on pt (see above). Left pt in chair with personal belongings and call light within reach. All needs met.  Lavanda Pollack Mobility Specialist  Please contact via [employer] or  Rehab Office [phone]

## 2023-12-30 NOTE — Plan of Care (Signed)

## 2023-12-31 ENCOUNTER — Telehealth: Payer: Self-pay | Admitting: *Deleted

## 2023-12-31 DIAGNOSIS — J9601 Acute respiratory failure with hypoxia: Secondary | ICD-10-CM | POA: Diagnosis not present

## 2023-12-31 NOTE — TOC Transition Note (Signed)
 Transition of Care [facility]) - Discharge Note   Patient Details  Name: Kerry Powell MRN: [id number] Date of Birth: 12-Dec-1945  [facility] CM/SW Contact:  Bridget Cordella Simmonds, LCSW Phone Number: 12/31/2023, 11:13 AM   Clinical Narrative:   Pt discharging to [facility]. RN report to [phone].   Pt son Armida will transport.  Pt will need to be brought down to main north tower entrance with assistance getting into the vehicle.    Final next level of care: Skilled Nursing Facility Barriers to Discharge: Barriers Resolved   Patient Goals and CMS Choice Patient states their goals for this hospitalization and ongoing recovery are:: SNF          Discharge Placement              Patient chooses bed at: [facility], [place] Patient to be transferred to facility by: son Armida Name of family member notified: son Armida Patient and family notified of of transfer: 12/31/23  Discharge Plan and Services Additional resources added to the After Visit Summary for   In-house Referral: Clinical Social Work   Post Acute Care Choice: Skilled Nursing Facility                               Social Drivers of Health (SDOH) Interventions SDOH Screenings   Food Insecurity: No Food Insecurity (12/21/2023)  Housing: Low Risk  (12/21/2023)  Transportation Needs: No Transportation Needs (12/21/2023)  Utilities: Not At Risk (12/21/2023)  Alcohol  Screen: Low Risk  (05/15/2023)  Depression (PHQ2-9): Low Risk  (11/20/2023)  Financial Resource Strain: Low Risk  (12/08/2023)  Physical Activity: Inactive (12/08/2023)  Social Connections: Unknown (12/24/2023)  Recent Concern: Social Connections - Socially Isolated (12/21/2023)  Stress: Stress Concern Present (12/08/2023)  Tobacco Use: Medium Risk (12/23/2023)  Health Literacy: Adequate Health Literacy (05/15/2023)     Readmission Risk Interventions     No data to display

## 2023-12-31 NOTE — Progress Notes (Signed)
 DISCHARGE NOTE [name] to be discharged Skilled nursing facility per MD order. Patient verbalized understanding.  Skin clean, dry and intact without evidence of skin break down, no evidence of skin tears noted. IV catheter discontinued intact. Site without signs and symptoms of complications. Dressing and pressure applied. Pt denies pain at the site currently. No complaints noted.  Patient free of lines, drains, and wounds.   Discharge packet assembled. An After Visit Summary (AVS) was printed and given to the Patient. Patient escorted via stretcher and discharged to [facility] via son car. Report called to accepting facility; all questions and concerns addressed.   Dana Debo K Coleton Woon, RN

## 2023-12-31 NOTE — Telephone Encounter (Signed)
 Received CMN for nebulizer machine/supplies.  Form signed and faxed to [facility] on 12/17/2023.  Copy made of CMN and placed in cabinet, original sent to scan.  Nothing further needed.

## 2023-12-31 NOTE — TOC Progression Note (Addendum)
 Transition of Care [facility]) - Progression Note    Patient Details  Name: Kerry Powell MRN: [id number] Date of Birth: January 09, 1946  [facility] CM/SW Contact  Bridget Cordella Simmonds, LCSW Phone Number: 12/31/2023, 8:36 AM  Clinical Narrative:   PASSR approved: [id number] F.  1010: [facility] confirmed she can receive pt today.  Auth expired yesterday but pt can still admit before midnight today.  CSW spoke with pt regarding transportation. She called son Armida and he is able to transport to [facility].   MD aware.  Expected Discharge Plan: Skilled Nursing Facility Barriers to Discharge: Awaiting State Approval (PASRR)               Expected Discharge Plan and Services In-house Referral: Clinical Social Work   Post Acute Care Choice: Skilled Nursing Facility Living arrangements for the past 2 months: Single Family Home Expected Discharge Date: 12/28/23                                     Social Drivers of Health (SDOH) Interventions SDOH Screenings   Food Insecurity: No Food Insecurity (12/21/2023)  Housing: Low Risk  (12/21/2023)  Transportation Needs: No Transportation Needs (12/21/2023)  Utilities: Not At Risk (12/21/2023)  Alcohol  Screen: Low Risk  (05/15/2023)  Depression (PHQ2-9): Low Risk  (11/20/2023)  Financial Resource Strain: Low Risk  (12/08/2023)  Physical Activity: Inactive (12/08/2023)  Social Connections: Unknown (12/24/2023)  Recent Concern: Social Connections - Socially Isolated (12/21/2023)  Stress: Stress Concern Present (12/08/2023)  Tobacco Use: Medium Risk (12/23/2023)  Health Literacy: Adequate Health Literacy (05/15/2023)    Readmission Risk Interventions     No data to display

## 2023-12-31 NOTE — Discharge Summary (Signed)
 Physician Discharge Summary  Powell Kerry FMW:[id number] DOB: 1945/07/07 DOA: 12/20/2023  PCP: Geofm Glade PARAS, MD  Admit date: 12/20/2023 Discharge date: 12/31/23  Admitted From: Home Disposition: SNF Recommendations for Outpatient Follow-up:  Outpatient follow-up with orthopedic surgery in 1 week Reassess blood pressure and adjust meds as appropriate. Check CMP and CBC in 1 week Please follow up on the following pending results: None  Discharge Condition: Stable CODE STATUS: DNR Diet Orders (From admission, onward)     Start     Ordered   12/21/23 0138  Diet regular Room service appropriate? Yes; Fluid consistency: Thin  Diet effective now       Question Answer Comment  Room service appropriate? Yes   Fluid consistency: Thin      12/21/23 0138             Contact information for follow-up providers     [name], MD. Schedule an appointment as soon as possible for a visit in 1 week(s).   Specialty: Orthopedic Surgery Contact information: [address] [name] [address] [phone]              Contact information for after-discharge care     Destination     [facility], [place] .   Service: Skilled Nursing Contact information: [address] [place]  [phone]                     Hospital course 78 y.o. 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     CXR with hyperinflation and emphysema.  Pelvic x-ray without acute finding.  Left tibial/fibular x-ray with septal cortical defect along the anterior aspect of the lateral  malleolus on the lateral view only, and osteopenia.  Left ankle x-ray without acute finding.  CT head without acute finding.  Left femoral x-ray without acute finding or hardware complication but mild degenerative changes of medial compartment of the knee.  Lumbar film with multilevel degenerative change. D-dimer was elevated but VQ scan was negative for PE.  Lower extremity venous Doppler was negative for DVT.  Patient was treated for COPD exacerbation with nebulizers and inhalers with improvement in her oxygen requirement and breathing.   While in hospital, patient had a UA with large pyuria and microscopic hematuria, and started on IV ceftriaxone  for possible UTI.  Urine culture NGTD.  Completed 5 days of ceftriaxone  empirically.   Hospital course complicated by orthostatic hypotension with normal to high supine blood pressure.  Started compression wears and decreased pain meds.  Regards to respiratory failure, patient ambulated on room air and maintained appropriate saturation.   Therapy recommended SNF.  Medically stable for discharge.    See individual problem list below for more.   Problems addressed during this hospitalization Acute on chronic hypoxic respiratory failure- worsening shortness of breath, tachycardia, increased oxygen requirement.  DVT ruled out by negative VQ scan and lower extremity venous Doppler.  CXR suggested hyperinflation/emphysema.  Ambulated to room air and maintained appropriate saturation. - Continue home inhalers.  COPD exacerbation?  Presented with shortness of breath.  Did not require steroid -Continue inhalers as  above.   Recurrent fall at home-trauma survey negative as above Left lower extremity pain-persistent pain since left femoral fracture for which she had IM nailing on 7/28.  She has tenderness over left trochanter but difficult to tell if the tenderness is due to instrumentation - Continue Tylenol , tramadol  and Voltaren gel. -Outpatient follow-up  with orthopedic surgery as above. -Continue PT/OT   Orthostatic hypotension/history of essential hypertension: Orthostatic vitals positive.  Symptomatic. -Compression socks and abdominal binder -Midodrine 5 mg TID  -OOB, elevate head of bed -Continue therapy.   Possible UTI-patient had some confusion and lower abdominal pain.  UA obtained and showed pyuria with hematuria.  Started on ceftriaxone .  Urine culture NGTD -Completed 5 days of ceftriaxone  on 10/31. -Discontinued Farxiga .   AKI on CKD-3B: Cr 2.18 (was 1.32 on 9/26).  Cr down to 1.6. - Avoid nephrotoxic meds-discontinued Lasix  and Farxiga . - Discontinue diuretics.   Chronic HFpEF: TTE in 09/2023 with LVEF of 60 to 65%, G1 DD and normal RV SF.  Appears euvolemic on exam. -Discontinued Lasix  and Farxiga . -Monitor fluid and respiratory status.   History of CAD/PAD: Stable. -Continue home Toprol -XL, aspirin, Plavix  and statin.   Delirium reportedly had confusion and hallucination likely from pain meds.  No formal diagnosis of cognitive impairment.  Only oriented x 4. -Minimize sedating medications   Controlled NIDDM-2: Last A1c 6.4% on 8/11.  On Farxiga  at home.  CBG within normal range for most part. - Farxiga  discontinued   Ecchymosis: Skin process across lower abdomen and also on left thigh from heparin .  Hgb stable. - Discontinued subcu heparin .  She is on Plavix  and aspirin which would be sufficient for VTE prophylaxis.   Hyperlipidemia -continue statin therapy.  Class I obesity Body mass index is 30.82 kg/m.           Consultations: None  Time spent 35  minutes  Vital signs Vitals:   12/30/23 2001 12/30/23 2107 12/31/23 0408 12/31/23 0829  BP: 106/63  (!) 116/92 (!) 109/91  Pulse: 81  93 (!) 107  Temp:  (!) 97.5 F (36.4 C) 97.8 F (36.6 C) 97.7 F (36.5 C)  Resp:      Height:      Weight:      SpO2: 95%  100% 94%  TempSrc:  Oral    BMI (Calculated):         Discharge exam  GENERAL: No  apparent distress.  Nontoxic. HEENT: MMM.  Vision and hearing grossly intact.  NECK: Supple.  No apparent JVD.  RESP:  No IWOB.  Fair aeration bilaterally. CVS:  RRR. Heart sounds normal.  ABD/GI/GU: BS+. Abd soft, NTND.  MSK/EXT:  Moves extremities. Subtotal weakness in left leg.   SKIN: Bruising/ecchymosis across lower abdomen mainly on the left.  Bruising over left thigh. NEURO: AA.  Oriented appropriately.  Subtle weakness in left leg.  PSYCH: Calm. Normal affect.   Discharge Instructions Discharge Instructions     Increase activity slowly   Complete by: As directed       Allergies as of 12/31/2023       Reactions   Gabapentin Other (See Comments)   Fluid retention    Clindamycin  Rash   Sulfa Antibiotics Nausea And Vomiting        Medication List     STOP taking these medications    Farxiga  10 MG Tabs tablet Generic drug: dapagliflozin  propanediol   furosemide  20 MG tablet Commonly known as: LASIX    potassium chloride  SA 20 MEQ  tablet Commonly known as: KLOR-CON  M   tizanidine 2 MG capsule Commonly known as: ZANAFLEX       TAKE these medications    acetaminophen  325 MG tablet Commonly known as: TYLENOL  Take 3 tablets (975 mg total) by mouth every 8 (eight) hours for 5 days, THEN 3 tablets (975 mg total) every 8 (eight) hours as needed for up to 5 days. Start taking on: December 26, 2023   albuterol  (2.5 MG/3ML) 0.083% nebulizer solution Commonly known as: PROVENTIL  USE 1 VIAL IN NEBULIZER EVERY 6 HOURS AND AS NEEDED FOR SHORT BREATH/WHEEZE.  Generic:  Ventolin    allopurinol  300 MG tablet Commonly known as: ZYLOPRIM  Take 1 tablet by mouth once daily   bisacodyl  10 MG suppository Commonly known as: DULCOLAX Place 1 suppository (10 mg total) rectally daily as needed for moderate constipation.   Blood Glucose Monitoring Suppl Devi May substitute to any manufacturer covered by patient's insurance. Patient request Accu-check meter and testing  suppliles   BLOOD GLUCOSE TEST STRIPS Strp May substitute to any manufacturer covered by patient's insurance. Patient to test glucose 2-3 times daily as needed   budesonide -formoterol  160-4.5 MCG/ACT inhaler Commonly known as: Symbicort  Inhale 2 puffs into the lungs 2 (two) times daily.   cholecalciferol 25 MCG (1000 UNIT) tablet Commonly known as: VITAMIN D3 Take 2,000 Units by mouth daily.   clopidogrel  75 MG tablet Commonly known as: PLAVIX  Take 1 tablet by mouth once daily   diclofenac Sodium 1 % Gel Commonly known as: VOLTAREN Apply 2 g topically 4 (four) times daily.   DULoxetine  30 MG capsule Commonly known as: CYMBALTA  Take 2 capsules (60 mg total) by mouth daily.   ferrous sulfate  325 (65 FE) MG tablet Take 325 mg by mouth every other day.   fluticasone  50 MCG/ACT nasal spray Commonly known as: FLONASE  Place 2 sprays into both nostrils daily.   Lancet Device Misc May substitute to any manufacturer covered by patient's insurance.  Patient to test glucose 2-3 times daily as needed   levocetirizine 5 MG tablet Commonly known as: XYZAL  SMARTSIG:1 Tablet(s) By Mouth Every Evening   metoprolol  succinate 25 MG 24 hr tablet Commonly known as: TOPROL -XL Take 1 tablet (25 mg total) by mouth daily. What changed: how much to take   midodrine 5 MG tablet Commonly known as: PROAMATINE Take 1 tablet (5 mg total) by mouth 3 (three) times daily with meals.   montelukast  10 MG tablet Commonly known as: SINGULAIR  TAKE 1 TABLET BY MOUTH ONCE DAILY AT BEDTIME   Mounjaro  5 MG/0.5ML Pen Generic drug: tirzepatide  Inject 5 mg into the skin once a week.   pantoprazole  40 MG tablet Commonly known as: PROTONIX  Take 1 tablet (40 mg total) by mouth 2 (two) times daily before a meal. What changed: when to take this   polyethylene glycol 17 g packet Commonly known as: MIRALAX  / GLYCOLAX  Take 17 g by mouth 2 (two) times daily.   rosuvastatin  10 MG tablet Commonly known as:  CRESTOR  Take 1 tablet (10 mg total) by mouth daily. What changed:  medication strength how much to take   sodium chloride  0.65 % Soln nasal spray Commonly known as: OCEAN Place 1 spray into both nostrils as needed for congestion.   tiotropium 18 MCG inhalation capsule Commonly known as: Spiriva  HandiHaler Place 1 capsule (18 mcg total) into inhaler and inhale daily.   traMADol  50 MG tablet Commonly known as: ULTRAM  Take 1 tablet (50 mg total) by mouth every 12 (twelve)  hours as needed for up to 7 days (chronic hip pain). What changed:  how much to take when to take this         Procedures/Studies:   NM Pulmonary Perfusion Result Date: 12/23/2023 CLINICAL DATA:  Acute on chronic hypoxic respiratory failure. Elevated D-dimer levels. Question pulmonary embolism. EXAM: NUCLEAR MEDICINE PERFUSION LUNG SCAN TECHNIQUE: Perfusion images were obtained in multiple projections after intravenous injection of radiopharmaceutical. No ventilation imaging performed per usual protocol. RADIOPHARMACEUTICALS:  4.04 mCi Tc-74m MAA IV COMPARISON:  Radiographs 12/23/2023 and 12/20/2023. Chest CTA 12/04/2023 FINDINGS: The pulmonary perfusion is within normal limits. There are no wedge-shaped perfusion defects to suggest acute pulmonary embolism. IMPRESSION: No evidence of acute pulmonary embolism on perfusion scintigraphy by PISAPED criteria. Electronically Signed   By: Elsie Perone M.D.   On: 12/23/2023 16:12   DG CHEST PORT 1 VIEW Result Date: 12/23/2023 CLINICAL DATA:  Hypoxia. EXAM: PORTABLE CHEST 1 VIEW COMPARISON:  Radiographs 12/20/2023 and 10/21/2023. Chest CTA 12/04/2023. FINDINGS: 1113 hours. The heart size and mediastinal contours are stable with aortic atherosclerosis. There is stable mild biapical scarring. The lungs otherwise appear clear. No evidence of pleural effusion or pneumothorax. Asymmetric glenohumeral degenerative changes on the left without evidence of acute osseous  abnormality. IMPRESSION: No evidence of acute cardiopulmonary process. Aortic atherosclerosis. Electronically Signed   By: Elsie Perone M.D.   On: 12/23/2023 16:08   VAS US  LOWER EXTREMITY VENOUS (DVT) Result Date: 12/23/2023  Lower Venous DVT Study Patient Name:  Kerry Powell  Date of Exam:   12/22/2023 Medical Rec #: [id number]       Accession #:    [id number] Date of Birth: 12-16-1945       Patient Gender: F Patient Age:   78 years Exam Location:  [facility] Procedure:      VAS US  LOWER EXTREMITY VENOUS (DVT) Referring Phys: DORN DAWSON --------------------------------------------------------------------------------  Indications: Edema.  Risk Factors: None identified. Limitations: Poor ultrasound/tissue interface and patient positioning. Comparison Study: No prior studies. Performing Technologist: Cordella Collet RVT  Examination Guidelines: A complete evaluation includes B-mode imaging, spectral Doppler, color Doppler, and power Doppler as needed of all accessible portions of each vessel. Bilateral testing is considered an integral part of a complete examination. Limited examinations for reoccurring indications may be performed as noted. The reflux portion of the exam is performed with the patient in reverse Trendelenburg.  +---------+---------------+---------+-----------+----------+--------------+ RIGHT    CompressibilityPhasicitySpontaneityPropertiesThrombus Aging +---------+---------------+---------+-----------+----------+--------------+ CFV      Full           Yes      Yes                                 +---------+---------------+---------+-----------+----------+--------------+ SFJ      Full                                                        +---------+---------------+---------+-----------+----------+--------------+ FV Prox  Full                                                         +---------+---------------+---------+-----------+----------+--------------+  FV Mid   Full                                                        +---------+---------------+---------+-----------+----------+--------------+ FV DistalFull                                                        +---------+---------------+---------+-----------+----------+--------------+ PFV      Full                                                        +---------+---------------+---------+-----------+----------+--------------+ POP      Full           Yes      Yes                                 +---------+---------------+---------+-----------+----------+--------------+ PTV      Full                                                        +---------+---------------+---------+-----------+----------+--------------+ PERO     Full                                                        +---------+---------------+---------+-----------+----------+--------------+   +---------+---------------+---------+-----------+----------+-------------------+ LEFT     CompressibilityPhasicitySpontaneityPropertiesThrombus Aging      +---------+---------------+---------+-----------+----------+-------------------+ CFV      Full           Yes      Yes                                      +---------+---------------+---------+-----------+----------+-------------------+ SFJ      Full                                                             +---------+---------------+---------+-----------+----------+-------------------+ FV Prox  Full                                                             +---------+---------------+---------+-----------+----------+-------------------+ FV Mid   Full                                                             +---------+---------------+---------+-----------+----------+-------------------+  FV DistalFull                                                              +---------+---------------+---------+-----------+----------+-------------------+ PFV      Full                                                             +---------+---------------+---------+-----------+----------+-------------------+ POP      Full           Yes      Yes                                      +---------+---------------+---------+-----------+----------+-------------------+ PTV      Full                                                             +---------+---------------+---------+-----------+----------+-------------------+ PERO                                                  Not well visualized +---------+---------------+---------+-----------+----------+-------------------+     Summary: RIGHT: - There is no evidence of deep vein thrombosis in the lower extremity.  - No cystic structure found in the popliteal fossa.  LEFT: - There is no evidence of deep vein thrombosis in the lower extremity. However, portions of this examination were limited- see technologist comments above.  - No cystic structure found in the popliteal fossa.  *See table(s) above for measurements and observations. Electronically signed by Gaile New MD on 12/23/2023 at 10:25:41 AM.    Final    DG Lumbar Spine 1 View Result Date: 12/22/2023 CLINICAL DATA:  Low back pain. EXAM: LUMBAR SPINE - 1 VIEW COMPARISON:  None Available. FINDINGS: There is no evidence of acute lumbar spine fracture. There is mild anterolisthesis at L4-L5. Intervertebral disc space narrowing is noted at multiple levels and most pronounced at L5-S1. Facet arthropathy is present throughout the lumbar spine. There is atherosclerotic calcification aorta. IMPRESSION: 1. No acute fracture. 2. Multilevel degenerative changes in the lumbar spine. 3. Aortic atherosclerosis. Electronically Signed   By: Leita Birmingham M.D.   On: 12/22/2023 19:12   DG FEMUR PORT MIN 2 VIEWS LEFT Result Date: 12/21/2023 EXAM: 2 VIEW(S) XRAY  OF THE LEFT FEMUR 12/21/2023 01:54:23 AM COMPARISON: Comparison left hip x-ray 10/24/1998 and 2025. CLINICAL HISTORY: 00502 Leg pain 99497. Leg pain FINDINGS: BONES AND JOINTS: Left-sided hip screw appears uncomplicated. No evidence for hardware loosening or acute fracture. Joint spaces are maintained. There are mild degenerative changes of the medial compartment of the knee. No focal osseous lesion. No joint dislocation. SOFT TISSUES: The soft tissues are unremarkable. IMPRESSION: 1. No acute fracture or hardware complication. 2.  Mild degenerative changes of the medial compartment of the knee. Electronically signed by: Greig Pique MD 12/21/2023 02:04 AM EDT RP Workstation: [id number]   DG Ankle Complete Left Result Date: 12/21/2023 EXAM: 3 OR MORE VIEW(S) XRAY OF THE LEFT ANKLE 12/21/2023 01:54:23 AM CLINICAL HISTORY: Leg pain. COMPARISON: None available. FINDINGS: BONES AND JOINTS: No acute fracture. No joint dislocation. Mild Tibiotalar degenerative changes. Mild degenerative changes of the dorsal midfoot. Small posterior calcaneal enthesophyte. SOFT TISSUES: The soft tissues are unremarkable. IMPRESSION: 1. No acute osseous abnormality. Electronically signed by: Pinkie Pebbles MD 12/21/2023 02:02 AM EDT RP Workstation: [id number]   CT Head Wo Contrast Result Date: 12/20/2023 EXAM: CT HEAD WITHOUT CONTRAST 12/20/2023 09:52:37 PM TECHNIQUE: CT of the head was performed without the administration of intravenous contrast. Automated exposure control, iterative reconstruction, and/or weight based adjustment of the mA/kV was utilized to reduce the radiation dose to as low as reasonably achievable. COMPARISON: 10/21/2023 CLINICAL HISTORY: Memory loss. Pt states multiple falls and hallucinations. FINDINGS: BRAIN AND VENTRICLES: No acute hemorrhage. No evidence of acute infarct. 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 Stable remote lacunar infarct in left lentiform nucleus. Stable white  matter changes compared to prior study. No hydrocephalus. No extra-axial collection. No mass effect or midline shift. Atherosclerotic calcifications in bilateral cavernous internal carotid arteries. ORBITS: Bilateral lens replacements noted. SINUSES: Fluid in posterior left ethmoid air cells. SOFT TISSUES AND SKULL: No acute soft tissue abnormality. No skull fracture. IMPRESSION: 1. No acute intracranial abnormality. Electronically signed by: Pinkie Pebbles MD 12/20/2023 09:56 PM EDT RP Workstation: [id number]   DG Chest Portable 1 View Result Date: 12/20/2023 CLINICAL DATA:  Shortness of breath.  Status post fall, pain. EXAM: PORTABLE CHEST 1 VIEW COMPARISON:  Radiograph 10/21/2023, CT 12/04/2023 FINDINGS: Hyperinflation and emphysema. Normal heart size with stable mediastinal contours. Aortic atherosclerosis. No confluent opacity, large pleural effusion or pneumothorax. No pulmonary edema. On limited assessment, no acute osseous findings. IMPRESSION: Hyperinflation and emphysema. No acute findings. Electronically Signed   By: Andrea Gasman M.D.   On: 12/20/2023 17:19   DG Pelvis 1-2 Views Result Date: 12/20/2023 CLINICAL DATA:  Pain after fall. EXAM: DG PELVIS 1-2V COMPARISON:  Radiograph 10/21/2023 FINDINGS: Femoral intramedullary nail and trans trochanteric screw fixation of previous intertrochanteric femur fracture. Interval fracture healing from initial injury radiograph. The hardware is intact were visualized. No evidence of acute pelvic fracture. Pubic rami are intact. No pubic symphyseal or sacroiliac diastasis. IMPRESSION: 1. No acute pelvic fracture. 2. ORIF of previous intertrochanteric femur fracture. Electronically Signed   By: Andrea Gasman M.D.   On: 12/20/2023 17:16   DG Tibia/Fibula Left Result Date: 12/20/2023 EXAM: 2 VIEW(S) XRAY OF THE LEFT TIBIA AND FIBULA 12/20/2023 04:46:00 PM COMPARISON: None available. CLINICAL HISTORY: Pain after fall. Fall w/ pain. FINDINGS: BONES AND  JOINTS: Degenerative changes of the knee and ankle. On the lateral view only, there is a subtle cortical defect along the anterior aspect of the lateral malleolus. This is not confirmed on any other view. The bones are osteopenic. No joint dislocation. SOFT TISSUES: The soft tissues are unremarkable. IMPRESSION: 1. Subtle cortical defect along the anterior aspect of the lateral malleolus on the lateral view only, not confirmed on other views. A subtle nondisplaced fracture cannot be excluded. If there is high clinical concern for an acute fracture at this level, recommend recommend dedicated images of the left ankle. 2. Osteopenia. Electronically signed by: Greig Pique MD 12/20/2023 05:16 PM EDT RP Workstation: [id number]  CT ANGIO CHEST/ABD/PEL FOR DISSECTION W &/OR WO CONTRAST Result Date: 12/08/2023 CLINICAL DATA:  Thoracic aortic aneurysm EXAM: CT ANGIOGRAPHY CHEST, ABDOMEN AND PELVIS TECHNIQUE: Non-contrast CT of the chest was initially obtained. Multidetector CT imaging through the chest, abdomen and pelvis was performed using the standard protocol during bolus administration of intravenous contrast. Multiplanar reconstructed images and MIPs were obtained and reviewed to evaluate the vascular anatomy. RADIATION DOSE REDUCTION: This exam was performed according to the departmental dose-optimization program which includes automated exposure control, adjustment of the mA and/or kV according to patient size and/or use of iterative reconstruction technique. CONTRAST:  75mL OMNIPAQUE  IOHEXOL  350 MG/ML SOLN COMPARISON:  10/21/2023 FINDINGS: CTA CHEST FINDINGS VASCULAR Aorta: Satisfactory opacification of the aorta. Severe thoracic aortic atherosclerosis with a large burden of irregular mural thrombus throughout the descending thoracic aorta. Tubular ascending thoracic aorta measures up to 3.9 x 3.8 cm. Descending thoracic aorta measures up to 3.4 x 3.3 cm. No evidence of dissection or other acute findings.  Cardiovascular: No evidence of pulmonary embolism on limited non-tailored examination. Normal heart size. Left and right coronary artery calcifications. No pericardial effusion. Review of the MIP images confirms the above findings. NON VASCULAR Mediastinum/Nodes: No enlarged mediastinal, hilar, or axillary lymph nodes. Thyroid  gland, trachea, and esophagus demonstrate no significant findings. Lungs/Pleura: Moderate centrilobular emphysema. No pleural effusion or pneumothorax. Musculoskeletal: No chest wall abnormality. No acute osseous findings. Multiple subacute, partially callused fractures of the anterolateral left ribs. Review of the MIP images confirms the above findings. CTA ABDOMEN AND PELVIS FINDINGS VASCULAR Normal caliber of the abdominal aorta. Severe, irregular abdominal aortic atherosclerosis. No evidence of aneurysm, dissection, or other acute aortic pathology. 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 The vessel remains patent through its proximal order branches. Review of the MIP images confirms the above findings. NON-VASCULAR Hepatobiliary: No solid liver abnormality is seen. No gallstones, gallbladder wall thickening, or biliary dilatation. Pancreas: Unremarkable. No pancreatic ductal dilatation or surrounding inflammatory changes. Spleen: Normal in size without significant abnormality. Adrenals/Urinary Tract: Adrenal glands are unremarkable. Severely atrophic left kidney. Multifocal scarring of the right renal cortex. No calculi or hydronephrosis. Bladder is unremarkable. Stomach/Bowel: Stomach is within normal limits. Appendix appears normal. No evidence of bowel wall thickening, distention, or inflammatory changes. Lymphatic: No enlarged abdominal or pelvic lymph nodes. Reproductive: Hysterectomy. Other: No  abdominal wall hernia or abnormality. No ascites. Musculoskeletal: No acute osseous findings. Intramedullary nail fixation of the proximal left femur with subacute, comminuted intratrochanteric fractures. IMPRESSION: 1. Severe thoracic and abdominal aortic atherosclerosis. 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 Tubular ascending thoracic aorta measures up to 3.9 x 3.8 cm. Descending thoracic aorta measures up to 3.4 x 3.3 cm. 2. No evidence of dissection or other acute findings. 3. 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 4. 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 The vessel remains patent through its proximal order branches. 5. Coronary artery disease. 6. Emphysema. Aortic Atherosclerosis (ICD10-I70.0) and Emphysema (ICD10-J43.9). Electronically Signed   By: Marolyn JONETTA Jaksch M.D.   On: 12/08/2023 17:47       The results of significant diagnostics from this hospitalization (including imaging, microbiology, ancillary and laboratory) are listed below for reference.  Microbiology: Recent Results (from the past 240 hours)  Urine Culture (for pregnant, neutropenic or urologic patients or patients with an indwelling urinary catheter)     Status: None   Collection Time: 12/23/23 11:07 AM   Specimen: Urine, Clean Catch  Result Value Ref Range Status   Specimen Description URINE, CLEAN CATCH  Final   Special Requests NONE  Final   Culture   Final    NO GROWTH Performed at [facility], [address], [place], [name] [address]    Report Status 12/24/2023 FINAL  Final     Labs:  CBC: Recent Labs  Lab 12/26/23 0355 12/28/23 0436  WBC 8.9 8.1  HGB 13.2 12.6  HCT 41.2 38.3  MCV 93.6 91.8  PLT 239 314   BMP &GFR Recent Labs  Lab 12/25/23 0306 12/26/23 0355 12/27/23 0416  12/28/23 0436 12/28/23 0752 12/30/23 1258  NA 133* 135 134*  --  136 134*  K 3.8 3.9 3.9  --  3.8 4.2  CL 94* 100 100  --  101 101  CO2 27 23 25   --  23 21*  GLUCOSE 111* 101* 102*  --  176* 93  BUN 17 25* 24*  --  31* 39*  CREATININE 1.95* 1.72* 1.57*  --  1.49* 1.63*  CALCIUM  10.0 9.5 9.3  --  9.5 9.5  MG  --  1.8  --  2.0  --   --   PHOS  --  3.4 3.0  --  2.8 4.1   Estimated Creatinine Clearance: 30.4 mL/min (A) (by C-G formula based on SCr of 1.63 mg/dL (H)). Liver & Pancreas: Recent Labs  Lab 12/26/23 0355 12/27/23 0416 12/28/23 0752 12/30/23 1258  ALBUMIN  3.0* 3.1* 3.0* 3.5   No results for input(s): LIPASE, AMYLASE in the last 168 hours. No results for input(s): AMMONIA in the last 168 hours. Diabetic: No results for input(s): HGBA1C in the last 72 hours. Recent Labs  Lab 12/24/23 1603 12/24/23 2105 12/25/23 0553 12/25/23 1126  GLUCAP 109* 75 110* 94   Cardiac Enzymes: No results for input(s): CKTOTAL, CKMB, CKMBINDEX, TROPONINI in the last 168 hours. No results for input(s): PROBNP in the last 8760 hours. Coagulation Profile: No results for input(s): INR, PROTIME in the last 168 hours. Thyroid  Function Tests: No results for input(s): TSH, T4TOTAL, FREET4, T3FREE, THYROIDAB in the last 72 hours. Lipid Profile: No results for input(s): CHOL, HDL, LDLCALC, TRIG, CHOLHDL, LDLDIRECT in the last 72 hours. Anemia Panel: No results for input(s): VITAMINB12, FOLATE, FERRITIN, TIBC, IRON, RETICCTPCT in the last 72 hours. Urine analysis:    Component Value Date/Time   COLORURINE YELLOW 12/22/2023 1339   APPEARANCEUR TURBID (A) 12/22/2023 1339   LABSPEC 1.010 12/22/2023 1339   PHURINE 6.0 12/22/2023 1339   GLUCOSEU >=500 (A) 12/22/2023 1339   GLUCOSEU >=1000 (A) 05/15/2022 1554   HGBUR MODERATE (A) 12/22/2023 1339   BILIRUBINUR NEGATIVE 12/22/2023 1339   BILIRUBINUR negative 09/25/2022 1027   KETONESUR  NEGATIVE 12/22/2023 1339   PROTEINUR 100 (A) 12/22/2023 1339   UROBILINOGEN 0.2 09/25/2022 1027   UROBILINOGEN 0.2 05/15/2022 1554   NITRITE NEGATIVE 12/22/2023 1339   LEUKOCYTESUR LARGE (A) 12/22/2023 1339   Sepsis Labs: Invalid input(s): PROCALCITONIN, LACTICIDVEN   SIGNED:  Erykah Lippert T Elliett Guarisco, MD  [facility] 12/31/2023, 11:00 AM

## 2023-12-31 NOTE — Progress Notes (Signed)
 Occupational Therapy Treatment Patient Details Name: Kerry Powell MRN: [id number] DOB: 1945-11-25 Today's Date: 12/31/2023   History of present illness Pt is 78 yo presenting to [facility] on 10/24 due to AMS and increased shortness of breath with exertion and increased O2 demands. PMH includes Recent IMN of the L femur on 8/28, DMII, CKD, HFpEF, COPD.   OT comments  Pt is progressing well towards established goals. Focus of session on increasing independence with UB and LB dressing and facilitating safety awareness for ADL engagement. Pt required Mod A for LB tasks and CGA for functional mobility. OT to continue per POC.       If plan is discharge home, recommend the following:  A little help with walking and/or transfers;A lot of help with bathing/dressing/bathroom;Assistance with cooking/housework;Assist for transportation;Help with stairs or ramp for entrance   Equipment Recommendations  Other (comment) (defer)    Recommendations for Other Services      Precautions / Restrictions Precautions Precautions: Fall Recall of Precautions/Restrictions: Intact Precaution/Restrictions Comments: watch BP Restrictions Weight Bearing Restrictions Per Provider Order: No       Mobility Bed Mobility               General bed mobility comments: Pt greeted seated EOB and returned to position at end of session    Transfers Overall transfer level: Needs assistance Equipment used: Rolling walker (2 wheels) Transfers: Sit to/from Stand Sit to Stand: Contact guard assist           General transfer comment: CGA for safety and to maintain balance when engaging in ADLs. Pt aware of BP.     Balance Overall balance assessment: Needs assistance Sitting-balance support: No upper extremity supported, Feet supported Sitting balance-Leahy Scale: Normal     Standing balance support: Single extremity supported, During functional activity, Reliant on assistive device for balance Standing  balance-Leahy Scale: Poor Standing balance comment: reliant on RW                           ADL either performed or assessed with clinical judgement   ADL Overall ADL's : Needs assistance/impaired     Grooming: Brushing hair;Set up;Sitting           Upper Body Dressing : Independent;Sitting   Lower Body Dressing: Moderate assistance;Sit to/from stand;Cueing for safety                 General ADL Comments: Pt required Mod A for LB dressing to don pants and shoes at bedside    Extremity/Trunk Assessment Upper Extremity Assessment Upper Extremity Assessment: Generalized weakness            Vision   Vision Assessment?: No apparent visual deficits   Perception     Praxis     Communication Communication Communication: No apparent difficulties   Cognition Arousal: Alert Behavior During Therapy: WFL for tasks assessed/performed Cognition: No apparent impairments                               Following commands: Intact        Cueing   Cueing Techniques: Verbal cues  Exercises      Shoulder Instructions       General Comments VSS during ADL dressing and grooming tasks    Pertinent Vitals/ Pain       Pain Assessment Pain Assessment: Faces Faces Pain Scale: Hurts a little bit  Pain Location: LLE Pain Descriptors / Indicators: Discomfort, Grimacing, Guarding Pain Intervention(s): Limited activity within patient's tolerance, Monitored during session  Home Living                                          Prior Functioning/Environment              Frequency  Min 2X/week        Progress Toward Goals  OT Goals(current goals can now be found in the care plan section)  Progress towards OT goals: Progressing toward goals  Acute Rehab OT Goals Patient Stated Goal: to leave hospital OT Goal Formulation: With patient Time For Goal Achievement: 01/06/24 Potential to Achieve Goals: Good ADL Goals Pt  Will Perform Upper Body Dressing: with supervision;sitting Pt Will Perform Lower Body Dressing: sit to/from stand;sitting/lateral leans;with contact guard assist Pt Will Transfer to Toilet: with contact guard assist;ambulating;regular height toilet Pt Will Perform Tub/Shower Transfer: Tub transfer;Shower transfer;with contact guard assist;ambulating  Plan      Co-evaluation                 AM-PAC OT 6 Clicks Daily Activity     Outcome Measure   Help from another person eating meals?: None Help from another person taking care of personal grooming?: A Little Help from another person toileting, which includes using toliet, bedpan, or urinal?: A Little Help from another person bathing (including washing, rinsing, drying)?: A Lot Help from another person to put on and taking off regular upper body clothing?: None Help from another person to put on and taking off regular lower body clothing?: A Lot 6 Click Score: 18    End of Session Equipment Utilized During Treatment: Rolling walker (2 wheels)  OT Visit Diagnosis: Unsteadiness on feet (R26.81);Other abnormalities of gait and mobility (R26.89);Muscle weakness (generalized) (M62.81);Dizziness and giddiness (R42)   Activity Tolerance Patient tolerated treatment well   Patient Left in bed;with call bell/phone within reach   Nurse Communication          Time: 1036-1050 OT Time Calculation (min): 14 min  Charges: OT General Charges $OT Visit: 1 Visit OT Treatments $Self Care/Home Management : 8-22 mins  Maurilio CROME, OTR/L.  [facility] Acute Rehabilitation  Office: [phone]   Maurilio PARAS Ariauna Farabee 12/31/2023, 11:09 AM

## 2024-01-03 DIAGNOSIS — R42 Dizziness and giddiness: Secondary | ICD-10-CM | POA: Diagnosis not present

## 2024-01-04 ENCOUNTER — Ambulatory Visit: Payer: Self-pay | Admitting: Internal Medicine

## 2024-01-13 NOTE — Progress Notes (Unsigned)
 Subjective:    Patient ID: Kerry Powell, female    DOB: December 17, 1945, 78 y.o.   MRN: [id number]     HPI Kerry Powell is here for follow up from hospital.  Hospitalized 10/25-11/4   She presented to the ED with shortness of breath, persistent leg pain and recurrent falls.  She was admitted for acute on chronic respiratory failure, AKI, recurrent falls and left lower extremity pain.  Hospital course complicated by orthostatic hypotension with normal to high supine blood pressure.  Started compression socks and decreased pain medication.  Acute on chronic hypoxic respiratory failure: Worsening shortness of breath, tachycardia, increased.  Requirement DVT ruled out by negative V/Q scan and lower extremity venous Doppler Chest x-ray showed hyperinflation/emphysema Ambulated on room air prior to discharge and maintained appropriate saturation Continue home inhalers  COPD exacerbation?: Presented with shortness of breath Did not require steroids Continue home inhalers  Recurrent falls at home: Left lower extremity pain-persistent pain since left femoral fracture which she had IM nailing on 7/28 Tenderness over left trochanter Continue Tylenol , tramadol  and Voltaren gel Outpatient follow-up with orthopedic surgery Continue PT, OT  Orthostatic hypotension, history of HTN: Positive orthostatic vitals Symptomatic Compression socks, abdominal binder Midodrine 5 mg 3 times daily Out of bed, elevate head of bed Continue PT  Possible UTI: Initially had some confusion and lower abdominal pain UA showed pyuria and hematuria-started on ceftriaxone  Urine culture NGTD Completed 5 days ceftriaxone  Farxiga  discontinued  AKI on CKD 3B: Initially CR 2.18-improved to 1.6 Discontinued Lasix  and Farxiga  Avoid nephrotoxic medications  Chronic HFpEF: TTE 8/25 with EF 60-65%, G1 DD, normal RV Euvolemic Lasix  and Farxiga  discontinued  History of CAD/PAD: Stable Continue Toprol -XL,  aspirin, Plavix  and statin  Delirium: Reportedly had confusion and hallucination while on pain meds-likely secondary to the medication Resolved  Controlled type 2 diabetes: Last A1c 6.4% Farxiga  discontinued secondary to UTI-like symptoms  Ecchymosis: Lower abdomen, left thigh-from heparin , discontinued H/H stable  Hyperlipidemia: Continue statin On Plavix , aspirin   Medications and allergies reviewed with patient and updated if appropriate.  Current Outpatient Medications on File Prior to Visit  Medication Sig Dispense Refill   albuterol  (PROVENTIL ) (2.5 MG/3ML) 0.083% nebulizer solution USE 1 VIAL IN NEBULIZER EVERY 6 HOURS AND AS NEEDED FOR SHORT BREATH/WHEEZE.  Generic:  Ventolin  75 mL 2   allopurinol  (ZYLOPRIM ) 300 MG tablet Take 1 tablet by mouth once daily 90 tablet 0   bisacodyl  (DULCOLAX) 10 MG suppository Place 1 suppository (10 mg total) rectally daily as needed for moderate constipation. (Patient not taking: Reported on 12/21/2023) 12 suppository 0   Blood Glucose Monitoring Suppl DEVI May substitute to any manufacturer covered by patient's insurance. Patient request Accu-check meter and testing suppliles 1 kit 0   budesonide -formoterol  (SYMBICORT ) 160-4.5 MCG/ACT inhaler Inhale 2 puffs into the lungs 2 (two) times daily. 1 each 12   cholecalciferol (VITAMIN D3) 25 MCG (1000 UNIT) tablet Take 2,000 Units by mouth daily.     clopidogrel  (PLAVIX ) 75 MG tablet Take 1 tablet by mouth once daily 90 tablet 0   diclofenac Sodium (VOLTAREN) 1 % GEL Apply 2 g topically 4 (four) times daily.     DULoxetine  (CYMBALTA ) 30 MG capsule Take 2 capsules (60 mg total) by mouth daily. 60 capsule 0   ferrous sulfate  325 (65 FE) MG tablet Take 325 mg by mouth every other day.     fluticasone  (FLONASE ) 50 MCG/ACT nasal spray Place 2 sprays into both nostrils daily.  16 g 6   Glucose Blood (BLOOD GLUCOSE TEST STRIPS) STRP May substitute to any manufacturer covered by patient's  insurance. Patient to test glucose 2-3 times daily as needed 200 strip 11   Lancet Device MISC May substitute to any manufacturer covered by patient's insurance.  Patient to test glucose 2-3 times daily as needed 1 each 11   levocetirizine (XYZAL ) 5 MG tablet SMARTSIG:1 Tablet(s) By Mouth Every Evening     metoprolol  succinate (TOPROL -XL) 25 MG 24 hr tablet Take 1 tablet (25 mg total) by mouth daily.     midodrine (PROAMATINE) 5 MG tablet Take 1 tablet (5 mg total) by mouth 3 (three) times daily with meals.     montelukast  (SINGULAIR ) 10 MG tablet TAKE 1 TABLET BY MOUTH ONCE DAILY AT BEDTIME 30 tablet 5   pantoprazole  (PROTONIX ) 40 MG tablet Take 1 tablet (40 mg total) by mouth 2 (two) times daily before a meal. (Patient taking differently: Take 40 mg by mouth daily.) 180 tablet 1   polyethylene glycol (MIRALAX  / GLYCOLAX ) 17 g packet Take 17 g by mouth 2 (two) times daily. 14 each 0   rosuvastatin  (CRESTOR ) 10 MG tablet Take 1 tablet (10 mg total) by mouth daily.     sodium chloride  (OCEAN) 0.65 % SOLN nasal spray Place 1 spray into both nostrils as needed for congestion. 480 mL 1   tiotropium (SPIRIVA  HANDIHALER) 18 MCG inhalation capsule Place 1 capsule (18 mcg total) into inhaler and inhale daily. 30 capsule 2   tirzepatide  (MOUNJARO ) 5 MG/0.5ML Pen Inject 5 mg into the skin once a week. 2 mL 0   No current facility-administered medications on file prior to visit.     Review of Systems     Objective:  There were no vitals filed for this visit. BP Readings from Last 3 Encounters:  12/31/23 (!) 109/91  12/12/23 106/75  12/09/23 126/80   Wt Readings from Last 3 Encounters:  12/29/23 185 lb 3 oz (84 kg)  12/12/23 187 lb (84.8 kg)  12/09/23 187 lb (84.8 kg)   There is no height or weight on file to calculate BMI.    Physical Exam     Lab Results  Component Value Date   WBC 8.1 12/28/2023   HGB 12.6 12/28/2023   HCT 38.3 12/28/2023   PLT 314 12/28/2023   GLUCOSE 93  12/30/2023   CHOL 119 06/05/2023   TRIG 101.0 06/05/2023   HDL 41.90 06/05/2023   LDLDIRECT 79.0 08/01/2020   LDLCALC 57 06/05/2023   ALT 11 12/20/2023   AST 22 12/20/2023   NA 134 (L) 12/30/2023   K 4.2 12/30/2023   CL 101 12/30/2023   CREATININE 1.63 (H) 12/30/2023   BUN 39 (H) 12/30/2023   CO2 21 (L) 12/30/2023   TSH 7.062 (H) 12/21/2023   INR 1.0 12/20/2023   HGBA1C 6.4 (H) 10/07/2023   MICROALBUR 0.9 06/05/2023     Assessment & Plan:    See Problem List for Assessment and Plan of chronic medical problems.

## 2024-01-14 ENCOUNTER — Ambulatory Visit: Admitting: Internal Medicine

## 2024-01-14 ENCOUNTER — Telehealth: Payer: Self-pay

## 2024-01-14 ENCOUNTER — Encounter: Payer: Self-pay | Admitting: Internal Medicine

## 2024-01-14 VITALS — BP 124/58 | HR 80 | Temp 98.0°F | Ht 65.0 in | Wt 194.0 lb

## 2024-01-14 DIAGNOSIS — N184 Chronic kidney disease, stage 4 (severe): Secondary | ICD-10-CM

## 2024-01-14 DIAGNOSIS — E1122 Type 2 diabetes mellitus with diabetic chronic kidney disease: Secondary | ICD-10-CM

## 2024-01-14 DIAGNOSIS — Z7984 Long term (current) use of oral hypoglycemic drugs: Secondary | ICD-10-CM

## 2024-01-14 DIAGNOSIS — I951 Orthostatic hypotension: Secondary | ICD-10-CM | POA: Diagnosis not present

## 2024-01-14 DIAGNOSIS — Z7985 Long-term (current) use of injectable non-insulin antidiabetic drugs: Secondary | ICD-10-CM | POA: Diagnosis not present

## 2024-01-14 DIAGNOSIS — E1169 Type 2 diabetes mellitus with other specified complication: Secondary | ICD-10-CM

## 2024-01-14 DIAGNOSIS — J9601 Acute respiratory failure with hypoxia: Secondary | ICD-10-CM | POA: Diagnosis not present

## 2024-01-14 DIAGNOSIS — E785 Hyperlipidemia, unspecified: Secondary | ICD-10-CM | POA: Diagnosis not present

## 2024-01-14 DIAGNOSIS — Z87448 Personal history of other diseases of urinary system: Secondary | ICD-10-CM

## 2024-01-14 DIAGNOSIS — N179 Acute kidney failure, unspecified: Secondary | ICD-10-CM

## 2024-01-14 DIAGNOSIS — R296 Repeated falls: Secondary | ICD-10-CM | POA: Diagnosis not present

## 2024-01-14 DIAGNOSIS — J432 Centrilobular emphysema: Secondary | ICD-10-CM | POA: Diagnosis not present

## 2024-01-14 DIAGNOSIS — I5032 Chronic diastolic (congestive) heart failure: Secondary | ICD-10-CM | POA: Diagnosis not present

## 2024-01-14 DIAGNOSIS — R0602 Shortness of breath: Secondary | ICD-10-CM

## 2024-01-14 DIAGNOSIS — R300 Dysuria: Secondary | ICD-10-CM

## 2024-01-14 MED ORDER — DAPAGLIFLOZIN PROPANEDIOL 10 MG PO TABS: 10.0000 mg | ORAL_TABLET | Freq: Every day | ORAL | Status: DC

## 2024-01-14 NOTE — Patient Instructions (Addendum)
      Medications changes include :   restart farxiga  10 mg daily   I would recommend seeing Dr Genelle with [facility] for a second opinion on your left leg pain.      Return in about 2 weeks (around 01/28/2024) for follow up.

## 2024-01-14 NOTE — Assessment & Plan Note (Signed)
 Chronic Moderate centrilobular emphysema Chronic respiratory failure with hypoxia-oxygenation is better and not using oxygen continuously Monitor oxygen level and use oxygen as needed Continue Symbicort  2 puffs twice daily, Spiriva  daily and albuterol  as needed Following with pulmonary

## 2024-01-14 NOTE — Assessment & Plan Note (Signed)
 Resolved Improved during hospital stay and at rehab after Lasix  and Farxiga  were discontinued Encouraged good hydration

## 2024-01-14 NOTE — Transitions of Care (Post Inpatient/ED Visit) (Signed)
 01/14/2024  Name: Kerry Powell MRN: [id number] DOB: [birth year]  Today's TOC FU Call Status: Today's TOC FU Call Status:: Successful TOC FU Call Completed TOC FU Call Complete Date: 01/14/24  Patient's Name and Date of Birth confirmed. Name, DOB  Transition Care Management Follow-up Telephone Call Date of Discharge: 01/13/24 Discharge Facility: Other [facility]) Name of Other ([facility]) Discharge Facility: [facility] Type of Discharge: Inpatient Admission Primary Inpatient Discharge Diagnosis:: fall How have you been since you were released from the hospital?: Same Any questions or concerns?: No  Items Reviewed: Did you receive and understand the discharge instructions provided?: Yes Medications obtained,verified, and reconciled?: Yes (Medications Reviewed) Any new allergies since your discharge?: No Dietary orders reviewed?: Yes People in Home [RPT]: child(ren), adult  Medications Reviewed Today: Medications Reviewed Today     Reviewed by Emmitt Pan, LPN (Licensed Practical Nurse) on 01/14/24 at 0945  Med List Status: <None>   Medication Order Taking? Sig Documenting Provider Last Dose Status Informant  albuterol  (PROVENTIL ) (2.5 MG/3ML) 0.083% nebulizer solution [id number] Yes USE 1 VIAL IN NEBULIZER EVERY 6 HOURS AND AS NEEDED FOR SHORT BREATH/WHEEZE.  Generic:  Ventolin  Desai, Nikita S, MD  Active Self, Pharmacy Records, Other  allopurinol  (ZYLOPRIM ) 300 MG tablet [id number] Yes Take 1 tablet by mouth once daily Burns, Glade PARAS, MD  Active Self, Pharmacy Records, Other  bisacodyl  (DULCOLAX) 10 MG suppository [id number]  Place 1 suppository (10 mg total) rectally daily as needed for moderate constipation.  Patient not taking: Reported on 01/14/2024   Christobal Guadalajara, MD  Active   Blood Glucose Monitoring Sidney FRENCH [id number] Yes May substitute to any manufacturer covered by patient's insurance. Patient request Accu-check meter and testing suppliles Burns, Glade PARAS, MD   Active   budesonide -formoterol  (SYMBICORT ) 160-4.5 MCG/ACT inhaler [id number] Yes Inhale 2 puffs into the lungs 2 (two) times daily. Ghimire, Donalda HERO, MD  Active Self, Pharmacy Records, Other  cholecalciferol (VITAMIN D3) 25 MCG (1000 UNIT) tablet [id number] Yes Take 2,000 Units by mouth daily. [provider]  Active Self, Pharmacy Records, Other  clopidogrel  (PLAVIX ) 75 MG tablet [id number] Yes Take 1 tablet by mouth once daily Burns, Glade PARAS, MD  Active Self, Pharmacy Records, Other  diclofenac Sodium (VOLTAREN) 1 % GEL [id number] Yes Apply 2 g topically 4 (four) times daily. Gonfa, Taye T, MD  Active   DULoxetine  (CYMBALTA ) 30 MG capsule [id number] Yes Take 2 capsules (60 mg total) by mouth daily. Geofm Glade PARAS, MD  Active   ferrous sulfate  325 (65 FE) MG tablet [id number] Yes Take 325 mg by mouth every other day. [provider]  Active Self, Pharmacy Records, Other  fluticasone  (FLONASE ) 50 MCG/ACT nasal spray [id number] Yes Place 2 sprays into both nostrils daily. Soldatova, Liuba, MD  Active Self, Pharmacy Records, Other  Glucose Blood (BLOOD GLUCOSE TEST STRIPS) STRP [id number] Yes May substitute to any manufacturer covered by patient's insurance. Patient to test glucose 2-3 times daily as needed Geofm Glade PARAS, MD  Active   HYDROcodone -acetaminophen  (HYCET) 7.5-325 mg/15 ml solution [id number] Yes Take 10 mLs by mouth 4 (four) times daily as needed for moderate pain (pain score 4-6). [provider]  Active   Lancet Device MISC [id number] Yes May substitute to any manufacturer covered by patient's insurance.  Patient to test glucose 2-3 times daily as needed Geofm Glade PARAS, MD  Active   levocetirizine (XYZAL ) 5 MG tablet [id number] Yes SMARTSIG:1 Tablet(s) By Mouth Every Evening [provider]  Active  metoprolol  succinate (TOPROL -XL) 25 MG 24 hr tablet [id number] Yes Take 1 tablet (25 mg total) by mouth daily. Gonfa, Taye T, MD  Active   midodrine (PROAMATINE) 5  MG tablet [id number] Yes Take 1 tablet (5 mg total) by mouth 3 (three) times daily with meals. Gonfa, Taye T, MD  Active   montelukast  (SINGULAIR ) 10 MG tablet [id number] Yes TAKE 1 TABLET BY MOUTH ONCE DAILY AT BEDTIME Geofm Glade PARAS, MD  Active Self, Pharmacy Records, Other  pantoprazole  (PROTONIX ) 40 MG tablet [id number] Yes Take 1 tablet (40 mg total) by mouth 2 (two) times daily before a meal.  Patient taking differently: Take 40 mg by mouth daily.   Geofm Glade PARAS, MD  Active Self, Pharmacy Records, Other  polyethylene glycol (MIRALAX  / GLYCOLAX ) 17 g packet [id number] Yes Take 17 g by mouth 2 (two) times daily. Christobal Guadalajara, MD  Active   rosuvastatin  (CRESTOR ) 10 MG tablet [id number] Yes Take 1 tablet (10 mg total) by mouth daily. Gonfa, Taye T, MD  Active   sodium chloride  (OCEAN) 0.65 % SOLN nasal spray [id number] Yes Place 1 spray into both nostrils as needed for congestion. Desai, Nikita S, MD  Active Self, Pharmacy Records, Other  tiotropium (SPIRIVA  HANDIHALER) 18 MCG inhalation capsule [id number] Yes Place 1 capsule (18 mcg total) into inhaler and inhale daily. Raenelle Donalda HERO, MD  Active Self, Pharmacy Records, Other  tirzepatide  (MOUNJARO ) 5 MG/0.5ML Pen [id number] Yes Inject 5 mg into the skin once a week. Geofm Glade PARAS, MD  Active             Home Care and Equipment/Supplies: Were Home Health Services Ordered?: Yes Name of Home Health Agency:: [facility] Has Agency set up a time to come to your home?: Yes First Home Health Visit Date: 01/13/24 Any new equipment or medical supplies ordered?: Yes Name of Medical supply agency?: unknown Were you able to get the equipment/medical supplies?: Yes Do you have any questions related to the use of the equipment/supplies?: No  Functional Questionnaire: Do you need assistance with bathing/showering or dressing?: Yes Do you need assistance with meal preparation?: Yes Do you need assistance with eating?: No Do you have difficulty maintaining  continence: No Do you need assistance with getting out of bed/getting out of a chair/moving?: Yes Do you have difficulty managing or taking your medications?: No  Follow up appointments reviewed: PCP Follow-up appointment confirmed?: Yes Date of PCP follow-up appointment?: 01/14/24 Follow-up Provider: [facility] Follow-up appointment confirmed?: Yes Date of Specialist follow-up appointment?: 01/15/24 Follow-Up Specialty Provider:: ortho Do you need transportation to your follow-up appointment?: No Do you understand care options if your condition(s) worsen?: Yes-patient verbalized understanding    SIGNATURE Julian Lemmings, LPN [facility] Nurse Health Advisor Direct Dial [phone]

## 2024-01-14 NOTE — Telephone Encounter (Signed)
 Copied from CRM [id number]. Topic: Clinical - Lab/Test Results >> Jan 14, 2024  3:16 PM Dedra B wrote: Reason for CRM: Pt calling for Tobias to give her the results of her urine test and to see if Dr. Geofm will prescribe her something for her bladder pain. Pls call pt.

## 2024-01-14 NOTE — Assessment & Plan Note (Signed)
 Acute on chronic Improved Was experiencing shortness of breath when she went into the hospital-?  COPD exacerbation, but no steroids necessary Oxygen level improved at rehab was not using oxygen Advised to monitor oxygen level at home at rest, with exertion and at night

## 2024-01-14 NOTE — Assessment & Plan Note (Addendum)
 Chronic Hypervolemia on exam-has had weight gain and has significant swelling Lasix  and Farxiga  were discontinued during the hospital stay Restart Farxiga  10 mg daily Can add in some Lasix  as needed Stressed good hydration Stressed getting up and moving around Monitor fluid balance closely-follow-up in 2 weeks

## 2024-01-14 NOTE — Assessment & Plan Note (Signed)
 Chronic Currently fluid overloaded Restart Farxiga  10 mg daily Follow-up in 2 weeks

## 2024-01-14 NOTE — Assessment & Plan Note (Signed)
 Subacute Has been having significant orthostatic hypotension Currently on midodrine 5 milligram 3 times daily and metoprolol  XL 25 mg daily Stay hydrated Encouraged her to get up and move around is much as possible Continue wearing compression socks Currently hypervolemic-Farxiga  restarted and may need to use Lasix  as needed Follow-up in 2 weeks

## 2024-01-14 NOTE — Assessment & Plan Note (Signed)
 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

## 2024-01-14 NOTE — Assessment & Plan Note (Signed)
Chronic Continue crestor 40 mg daily

## 2024-01-14 NOTE — Assessment & Plan Note (Signed)
 Chronic Recurrent falls for several months Several falls seem to be related to orthostatic hypotension Using walker continuously PT, OT

## 2024-01-14 NOTE — Assessment & Plan Note (Signed)
 Chronic  Lab Results  Component Value Date   HGBA1C 6.4 (H) 10/07/2023   Sugars controlled Continue mounjaro  5 mg weekly Restart Farxiga  10 mg daily due to fluid overload I will continue to prescribe diabetic supplies  -  glucometer and supplies including lancets, lancet device, glucose strips

## 2024-01-15 ENCOUNTER — Inpatient Hospital Stay: Admitting: Internal Medicine

## 2024-01-15 ENCOUNTER — Encounter: Payer: Self-pay | Admitting: Internal Medicine

## 2024-01-15 MED ORDER — OXYCODONE HCL 5 MG PO TABS: 5.0000 mg | ORAL_TABLET | Freq: Three times a day (TID) | ORAL | 0 refills | Status: DC | PRN

## 2024-01-15 NOTE — Telephone Encounter (Signed)
 I sent oxycodone  to the pharmacy-only use for severe pain.  She has had 2 negative urine cultures so I do not think she has an infection.  The bladder pain could be coming from something else.  She may need to see urology.

## 2024-01-15 NOTE — Addendum Note (Signed)
 Addended by: GEOFM GLADE PARAS on: 01/15/2024 01:07 PM   Modules accepted: Orders

## 2024-01-16 ENCOUNTER — Other Ambulatory Visit: Payer: Self-pay | Admitting: Internal Medicine

## 2024-01-16 ENCOUNTER — Telehealth: Payer: Self-pay

## 2024-01-16 DIAGNOSIS — E1169 Type 2 diabetes mellitus with other specified complication: Secondary | ICD-10-CM

## 2024-01-16 NOTE — Telephone Encounter (Signed)
Verbals given to Amy today ?

## 2024-01-16 NOTE — Telephone Encounter (Signed)
 Okay for orders?

## 2024-01-16 NOTE — Telephone Encounter (Signed)
 Copied from CRM [id number]. Topic: Clinical - Home Health Verbal Orders >> Jan 16, 2024  3:07 PM Delon DASEN wrote: Caller/Agency: Amy with [facility] Callback Number: [phone] Service Requested: Physical Therapy Frequency: 1x wk 2 wks,  2x wk  2 wks, 1x wk 1 wk Any new concerns about the patient? No

## 2024-01-17 ENCOUNTER — Ambulatory Visit: Payer: Self-pay

## 2024-01-17 NOTE — Telephone Encounter (Signed)
 Returned [facility] at [employer], unable to reach, spoke with Adrianna; attempted to connect call to [facility], left message for [place] to return call.     Nurse will call patient.

## 2024-01-17 NOTE — Telephone Encounter (Signed)
 FYI Only or Action Required?: Action required by provider: update on patient condition. Please call pt to check on her after a care coordinator manager called to report she has been falling more recently  Patient was last seen in primary care on 01/14/2024 by Geofm Glade PARAS, MD.  Called Nurse Triage reporting No chief complaint on file..  Symptoms began Unable to triage.  Interventions attempted: Other: Unable to reach pt.  Symptoms are: Unable to reach pt.  Triage Disposition: No Contact Calls  Patient/caregiver understands and will follow disposition?:   Attempted to call pt back, no answer. LVM to call clinic back at [phone]. Mary with [employer] ([employer] pt receives care navigation through) called earlier to report pt has been having frequent falls. Contacted [employer] and Ronal is not currently available. Sending message to clinic to f/u with pt.

## 2024-01-17 NOTE — Telephone Encounter (Signed)
 Copied from CRM #[id number]. Topic: Clinical - Medical Advice >> Jan 17, 2024  5:04 PM Ashley R wrote: Reason for CRM: calling to report Recent falls (July, August, October-before last admission). Confirmed office visit since last fall, discussed with PCP - hip in pain, fall score 21, pain score 7

## 2024-01-19 ENCOUNTER — Encounter: Payer: Self-pay | Admitting: Internal Medicine

## 2024-01-19 DIAGNOSIS — M79605 Pain in left leg: Secondary | ICD-10-CM

## 2024-01-20 ENCOUNTER — Encounter: Payer: Self-pay | Admitting: Internal Medicine

## 2024-01-20 ENCOUNTER — Telehealth: Payer: Self-pay

## 2024-01-20 MED ORDER — AMOXICILLIN-POT CLAVULANATE 875-125 MG PO TABS: 1.0000 | ORAL_TABLET | Freq: Two times a day (BID) | ORAL | 0 refills | Status: AC

## 2024-01-20 NOTE — Telephone Encounter (Signed)
 Copied from CRM #[id number]. Topic: Clinical - Prescription Issue >> Jan 20, 2024 12:19 PM Rea ORN wrote: Reason for CRM: Brittany with [facility] called to advise that they received an order for rx for diabetic supplies that they don't carry. They sent a fax on 11/19 and 11/21 to advise something else needs to be ordered but they have not gotten a response. Brittany is asking that paperwork be faxed back.  Please call back [phone]

## 2024-01-20 NOTE — Telephone Encounter (Signed)
 Patient has already sent my-chart messages and these have been sent to provider.

## 2024-01-20 NOTE — Op Note (Signed)
 PATIENT:  Kerry Powell  1945/08/18 female   MEDICAL RECORD NUMBER: [id number]  PRE-OPERATIVE DIAGNOSIS:  LEFT INTERTROCHANTERIC HIP FRACTURE  POST-OPERATIVE DIAGNOSIS:  LEFT INTERTROCHANTERIC HIP FRACTURE  PROCEDURE:  INTRAMEDULLARY NAILING OF THE LEFT HIP using a Biomet Affixus nail 11 X 180 mm diameter locked short.  SURGEON:  Kerry Powell, M.D.  ASSISTANT:  Francis Mt, PA-C.  ANESTHESIA:  General.  COMPLICATIONS:  None.  ESTIMATED BLOOD LOSS:  Less than 150 mL.  DISPOSITION:  To PACU.  CONDITION:  Stable.  DELAY START OF DVT PROPHYLAXIS BECAUSE OF BLEEDING RISK: NO  BRIEF SUMMARY AND INDICATION OF PROCEDURE:  Kerry Powell is a 78 y.o. year- old with multiple medical problems, including repair of the contralateral hip just four weeks ago.  I discussed with the patient and family risks and benefits of surgical treatment including the potential for malunion, nonunion, symptomatic hardware, heart attack, stroke, neurovascular injury, bleeding, and others.  After acknowledgement of these risks, consent was provided to proceed.  BRIEF SUMMARY OF PROCEDURE:  The patient was taken to the operating room where general anesthesia was induced.  He was positioned supine on the Hana fracture table.  A closed reduction maneuver was performed of the fractured proximal femur and this was confirmed on both AP and lateral xray views. A thorough scrub and wash with chlorhexidine  and then Betadine  scrub and paint was performed.  After sterile drapes and time-out, a long instrument was used to identify the appropriate starting position under C-arm on both AP and lateral images.  A 3 cm incision was made proximal to the greater trochanter.  The curved cannulated awl was inserted just medial to the tip of the lateral trochanter and then the starting guidewire advanced into the proximal femur.  This was checked on AP and lateral views.  The starting reamer was engaged with the soft tissue  protected by a sleeve.  The short nail was then inserted to the appropriate depth.  The guidewire for the lag screw was then inserted with appropriate anteversion to make sure it was in a center-center position. Because of the potential for malrotation with lag screw insertion, an additional antirotation pin was placed proximal to this. The lag screw pin was measured and the lag screw placed with excellent purchase and position checked on both views.  The set screw was then engaged within the groove of the lag screw, which was allowed to telescope. The antirotation pin was withdrawn. Traction was released and compression achieved with the compression device over the lag screw.  This was followed by placement of one distal locking screw using perfect circle technique.  This was followed by placement of one distal locking screw using the jig.  This was confirmed on AP and lateral images. Wounds were irrigated thoroughly, closed in a standard layered fashion. Sterile gently compressive dressings were applied. Francis Mt, PA-C assisted throughout.  The patient was awakened from anesthesia and transported to the PACU in stable condition.  PROGNOSIS:  The patient will be weightbearing as tolerated with physical Therapy. DVT prophylaxis will be started.  There are no range of motion precautions.  We will continue to follow while in the hospital.  Anticipate follow up in the office in 2 weeks for removal of sutures and new x-rays.     Kerry Powell, M.D.

## 2024-01-20 NOTE — Addendum Note (Signed)
 Addended by: GEOFM GLADE PARAS on: 01/20/2024 08:45 PM   Modules accepted: Orders

## 2024-01-21 ENCOUNTER — Ambulatory Visit: Payer: Self-pay

## 2024-01-21 ENCOUNTER — Encounter: Payer: Self-pay | Admitting: Internal Medicine

## 2024-01-21 NOTE — Telephone Encounter (Signed)
 Form that was received was asking for last 6 months of clinicals to be faxed.  Info faxed today

## 2024-01-21 NOTE — Progress Notes (Unsigned)
 Subjective:    Patient ID: Kerry Powell, female    DOB: Nov 01, 1945, 78 y.o.   MRN: [id number]      HPI Kerry Powell is here for No chief complaint on file.  2 falls yesterday.   ?  Stop Farxiga -could be causing dysuria   Medications and allergies reviewed with patient and updated if appropriate.  Current Outpatient Medications on File Prior to Visit  Medication Sig Dispense Refill   albuterol  (PROVENTIL ) (2.5 MG/3ML) 0.083% nebulizer solution USE 1 VIAL IN NEBULIZER EVERY 6 HOURS AND AS NEEDED FOR SHORT BREATH/WHEEZE.  Generic:  Ventolin  75 mL 2   allopurinol  (ZYLOPRIM ) 300 MG tablet Take 1 tablet by mouth once daily 90 tablet 0   amoxicillin -clavulanate (AUGMENTIN ) 875-125 MG tablet Take 1 tablet by mouth 2 (two) times daily for 7 days. 14 tablet 0   Blood Glucose Monitoring Suppl DEVI May substitute to any manufacturer covered by patient's insurance. Patient request Accu-check meter and testing suppliles 1 kit 0   budesonide -formoterol  (SYMBICORT ) 160-4.5 MCG/ACT inhaler Inhale 2 puffs into the lungs 2 (two) times daily. 1 each 12   cholecalciferol (VITAMIN D3) 25 MCG (1000 UNIT) tablet Take 2,000 Units by mouth daily.     clopidogrel  (PLAVIX ) 75 MG tablet Take 1 tablet by mouth once daily 90 tablet 0   dapagliflozin  propanediol (FARXIGA ) 10 MG TABS tablet Take 1 tablet (10 mg total) by mouth daily.     diclofenac  Sodium (VOLTAREN ) 1 % GEL Apply 2 g topically 4 (four) times daily.     DULoxetine  (CYMBALTA ) 30 MG capsule Take 2 capsules (60 mg total) by mouth daily. 60 capsule 0   ferrous sulfate  325 (65 FE) MG tablet Take 325 mg by mouth every other day.     fluticasone  (FLONASE ) 50 MCG/ACT nasal spray Place 2 sprays into both nostrils daily. 16 g 6   Glucose Blood (BLOOD GLUCOSE TEST STRIPS) STRP May substitute to any manufacturer covered by patient's insurance. Patient to test glucose 2-3 times daily as needed 200 strip 11   Lancet Device MISC May substitute to any manufacturer  covered by patient's insurance.  Patient to test glucose 2-3 times daily as needed 1 each 11   levocetirizine (XYZAL ) 5 MG tablet SMARTSIG:1 Tablet(s) By Mouth Every Evening     metoprolol  succinate (TOPROL -XL) 25 MG 24 hr tablet Take 1 tablet (25 mg total) by mouth daily.     midodrine  (PROAMATINE ) 5 MG tablet Take 1 tablet (5 mg total) by mouth 3 (three) times daily with meals.     montelukast  (SINGULAIR ) 10 MG tablet TAKE 1 TABLET BY MOUTH ONCE DAILY AT BEDTIME 30 tablet 5   MOUNJARO  5 MG/0.5ML Pen INJECT 1/2 (ONE-HALF) ML SUBCUTANEOUSLY  ONCE A WEEK 4 mL 0   oxyCODONE  (ROXICODONE ) 5 MG immediate release tablet Take 1 tablet (5 mg total) by mouth every 8 (eight) hours as needed for severe pain (pain score 7-10) (Chronic left leg pain). 30 tablet 0   pantoprazole  (PROTONIX ) 40 MG tablet Take 1 tablet (40 mg total) by mouth 2 (two) times daily before a meal. (Patient taking differently: Take 40 mg by mouth daily.) 180 tablet 1   polyethylene glycol (MIRALAX  / GLYCOLAX ) 17 g packet Take 17 g by mouth 2 (two) times daily. 14 each 0   rosuvastatin  (CRESTOR ) 10 MG tablet Take 1 tablet (10 mg total) by mouth daily.     sodium chloride  (OCEAN) 0.65 % SOLN nasal spray Place 1 spray into both  nostrils as needed for congestion. 480 mL 1   tiotropium (SPIRIVA  HANDIHALER) 18 MCG inhalation capsule Place 1 capsule (18 mcg total) into inhaler and inhale daily. 30 capsule 2   No current facility-administered medications on file prior to visit.    Review of Systems     Objective:  There were no vitals filed for this visit. BP Readings from Last 3 Encounters:  01/14/24 (!) 124/58  12/31/23 (!) 109/91  12/12/23 106/75   Wt Readings from Last 3 Encounters:  01/14/24 194 lb (88 kg)  12/29/23 185 lb 3 oz (84 kg)  12/12/23 187 lb (84.8 kg)   There is no height or weight on file to calculate BMI.    Physical Exam         Assessment & Plan:    See Problem List for Assessment and Plan of  chronic medical problems.

## 2024-01-21 NOTE — Telephone Encounter (Signed)
 FYI Only or Action Required?: FYI only for provider: appointment scheduled on 01/22/24.  Patient was last seen in primary care on 01/14/2024 by Geofm Glade PARAS, MD.  Called Nurse Triage reporting Cough and Fall.  Symptoms began yesterday.  Interventions attempted: Nothing.  Symptoms are: stable.  Triage Disposition: See Within 3 Days in Office (overriding See PCP Within 2 Weeks)  Patient/caregiver understands and will follow disposition?: Yes                               1. MECHANISM: How did the fall happen?     1st fall- pivoting from commode to bed and tripped over her feet  2nd fall- pivoting from bathroom to wheelchair and felt unbalanced, states she prevented herself from falling to the ground 3. ONSET: When did the fall happen? (e.g., minutes, hours, or days ago)     2 falls yesterday, reports 3-4 falls within past few months 4. LOCATION: What part of the body hit the ground? (e.g., back, buttocks, head, hips, knees, hands, head, stomach)     Denies hitting head, denies LOC 1st fall- back, hit night stand and slid down nightstand 2nd fall- prevented herself from falling to the ground 5. INJURY: Did you hurt (injure) yourself when you fell? If Yes, ask: What did you injure? Tell me more about this? (e.g., body area; type of injury; pain severity)     Redness near toes of right foot, swelling in bilateral legs to knee, swelling is baseline, but has increased, per [facility] RN Patient reports scrapes on back, bruises on right shin, bruises on right thigh, bruises on forearm, bruises on back, brusies on tailbone, bruises on bilateral knees 6. PAIN: Is there any pain? If Yes, ask: How bad is the pain? (e.g., Scale 0-10; or none, mild,      Denies pain at this time, but states she feels pain down her back, depending on how she moves Patient later reports right leg pain and swelling in right foot and pain in right groin area, states this started  after the fall  Patient states chronic leg pain is preventing sleep, oxycodone  is not helping 7. SIZE: For cuts, bruises, or swelling, ask: How large is it? (e.g., inches or centimeters)      Patient denies active bleeding  9. OTHER SYMPTOMS: Do you have any other symptoms? (e.g., dizziness, fever, weakness; new-onset or worsening).      Lightheadedness initially upon standing and generalized weakness of legs at baseline Productive cough and wheezing, per [facility] RN O2 94 % on room air and patient able to speak in clear and complete sentences while on phone with this RN Denies weakness, denies numbness, denies fever, denies chest pain, denies SOB at rest  10. CAUSE: What do you think caused the fall (or falling)? (e.g., dizzy spell, tripped)    Tripping        Patient's [facility] RN, April, originally called in to report two recent falls and a productive cough and wheezing. PCP has sent Augmentin  in for patient and patient has not started taking this medication. This RN advised patient to take first dose of Augmentin . This RN advised in-person evaluation for symptoms related to falls. Patient scheduled with PCP tomorrow afternoon. Patient has been advised to call back if symptoms worsen.   Copied from CRM [id number]. Topic: Clinical - Red Word Triage >> Jan 21, 2024  1:26 PM Deaijah H wrote: [facility]  that prompted transfer to Nurse Triage: April nurse w/ [facility] pretty bad cough and would like to know if Dr. Geofm could send something in for the cough, mucus (brownish) rattling/wheezing in right lung, also stated patient had 2 falls yesterday  Reason for Disposition  [1] Falling (two or more unexpected falls) AND [2] in past year  Protocols used: Falls and [facility]

## 2024-01-22 ENCOUNTER — Telehealth: Payer: Self-pay

## 2024-01-22 ENCOUNTER — Ambulatory Visit: Payer: Self-pay

## 2024-01-22 ENCOUNTER — Ambulatory Visit (INDEPENDENT_AMBULATORY_CARE_PROVIDER_SITE_OTHER)

## 2024-01-22 ENCOUNTER — Ambulatory Visit: Admitting: Internal Medicine

## 2024-01-22 VITALS — BP 160/88 | HR 104 | Temp 98.0°F | Ht 65.0 in | Wt 190.5 lb

## 2024-01-22 DIAGNOSIS — M545 Low back pain, unspecified: Secondary | ICD-10-CM

## 2024-01-22 DIAGNOSIS — M79671 Pain in right foot: Secondary | ICD-10-CM | POA: Diagnosis not present

## 2024-01-22 DIAGNOSIS — J069 Acute upper respiratory infection, unspecified: Secondary | ICD-10-CM

## 2024-01-22 DIAGNOSIS — J441 Chronic obstructive pulmonary disease with (acute) exacerbation: Secondary | ICD-10-CM

## 2024-01-22 DIAGNOSIS — R300 Dysuria: Secondary | ICD-10-CM

## 2024-01-22 DIAGNOSIS — M25551 Pain in right hip: Secondary | ICD-10-CM

## 2024-01-22 DIAGNOSIS — I951 Orthostatic hypotension: Secondary | ICD-10-CM

## 2024-01-22 NOTE — Telephone Encounter (Signed)
 FYI Only or Action Required?: FYI only for provider: appointment scheduled on 11/26.  Patient was last seen in primary care on 01/14/2024 by Geofm Glade PARAS, MD.  Called Nurse Triage reporting No chief complaint on file..  Symptoms began several months ago.  Interventions attempted: Nothing.  Symptoms are: stable.  Triage Disposition: Information or Advice Only Call  Patient/caregiver understands and will follow disposition?: Yes  Copied from CRM #[id number]. Topic: Clinical - Red Word Triage >> Jan 22, 2024  1:48 PM Kerry Powell wrote: Red Word that prompted transfer to Nurse Triage: Returning call from fall report on Monday. Tried twice and could not receive callbacks. Reason for Disposition  Health information question, no triage required and triager able to answer question  Answer Assessment - Initial Assessment Questions 1. REASON FOR CALL: What is the main reason for your call? or How can I best help you?     Kerry Powell for [facility] following up on patients condition from yesterday- she has not had any further falls today. Pain in the Right hip 5/10 and left 7/10. No pain noted yesterday in hips, mainly in her back. Kerry Powell is concerned that pt cannot get her legs together to walk properly and that hardware may have been knocked loose from her hip replacement during her fall. Site has some swelling- noted in call yesterday  Kerry Powell is urging provider to get inpatient evaluation of patient. Physical Therapy has also been working with patient and is concerned per [facility].  Spoke with patient to question pain as it was not reported in triage yesterday- she has hip pain everyday, some days are worse than others but today is one of those days that both are hurting. No hot hard knot, or redness to the areas, denies CP, SOB, or Fever. ED/UC precautions understood.  Protocols used: Information Only Call - No Triage-A-AH

## 2024-01-22 NOTE — Telephone Encounter (Signed)
 Copied from CRM #[id number]. Topic: Clinical - Prescription Issue >> Jan 22, 2024 10:46 AM Burnard DEL wrote: Reason for CRM: Pharmacy called stating that they received request for accucheck diabetic supplies for patient. However they carry vivaguard and would like to know if they could switch the supplies to that brand?

## 2024-01-22 NOTE — Patient Instructions (Addendum)
      Xrays ordered.     Medications changes include :   hold farxiga  for one week to see if your urine symptoms get better.   Hold the Spiriva  for now and see if the tongue swelling improves.    Finish the Augmentin

## 2024-01-23 ENCOUNTER — Ambulatory Visit: Payer: Self-pay | Admitting: Internal Medicine

## 2024-01-23 ENCOUNTER — Emergency Department (HOSPITAL_COMMUNITY)

## 2024-01-23 ENCOUNTER — Emergency Department (HOSPITAL_COMMUNITY)
Admission: EM | Admit: 2024-01-23 | Discharge: 2024-01-23 | Disposition: A | Discharge status: Home / Self Care | Attending: Emergency Medicine | Admitting: Emergency Medicine

## 2024-01-23 DIAGNOSIS — S40011A Contusion of right shoulder, initial encounter: Secondary | ICD-10-CM | POA: Diagnosis present

## 2024-01-23 DIAGNOSIS — Z7902 Long term (current) use of antithrombotics/antiplatelets: Secondary | ICD-10-CM | POA: Diagnosis not present

## 2024-01-23 DIAGNOSIS — Z79899 Other long term (current) drug therapy: Secondary | ICD-10-CM | POA: Diagnosis not present

## 2024-01-23 DIAGNOSIS — M25551 Pain in right hip: Secondary | ICD-10-CM | POA: Insufficient documentation

## 2024-01-23 DIAGNOSIS — S299XXA Unspecified injury of thorax, initial encounter: Secondary | ICD-10-CM | POA: Diagnosis present

## 2024-01-23 DIAGNOSIS — S0990XA Unspecified injury of head, initial encounter: Secondary | ICD-10-CM | POA: Diagnosis present

## 2024-01-23 DIAGNOSIS — W06XXXA Fall from bed, initial encounter: Secondary | ICD-10-CM | POA: Diagnosis not present

## 2024-01-23 DIAGNOSIS — R296 Repeated falls: Secondary | ICD-10-CM | POA: Insufficient documentation

## 2024-01-23 DIAGNOSIS — W19XXXA Unspecified fall, initial encounter: Secondary | ICD-10-CM

## 2024-01-23 DIAGNOSIS — G8929 Other chronic pain: Secondary | ICD-10-CM | POA: Insufficient documentation

## 2024-01-23 LAB — CBC
HCT: 33.4 % — ABNORMAL LOW (ref 36.0–46.0)
Hemoglobin: 10.2 g/dL — ABNORMAL LOW (ref 12.0–15.0)
MCH: 29.2 pg (ref 26.0–34.0)
MCHC: 30.5 g/dL (ref 30.0–36.0)
MCV: 95.7 fL (ref 80.0–100.0)
Platelets: 314 K/uL (ref 150–400)
RBC: 3.49 MIL/uL — ABNORMAL LOW (ref 3.87–5.11)
RDW: 16.2 % — ABNORMAL HIGH (ref 11.5–15.5)
WBC: 9.1 K/uL (ref 4.0–10.5)
nRBC: 0 % (ref 0.0–0.2)

## 2024-01-23 LAB — I-STAT CHEM 8, ED
BUN: 12 mg/dL (ref 8–23)
Calcium, Ion: 1.07 mmol/L — ABNORMAL LOW (ref 1.15–1.40)
Chloride: 102 mmol/L (ref 98–111)
Creatinine, Ser: 1.5 mg/dL — ABNORMAL HIGH (ref 0.44–1.00)
Glucose, Bld: 101 mg/dL — ABNORMAL HIGH (ref 70–99)
HCT: 34 % — ABNORMAL LOW (ref 36.0–46.0)
Hemoglobin: 11.6 g/dL — ABNORMAL LOW (ref 12.0–15.0)
Potassium: 4.3 mmol/L (ref 3.5–5.1)
Sodium: 137 mmol/L (ref 135–145)
TCO2: 27 mmol/L (ref 22–32)

## 2024-01-23 MED ORDER — IOHEXOL 350 MG/ML SOLN
75.0000 mL | Freq: Once | INTRAVENOUS | Status: AC | PRN
  Administered 2024-01-23: 75 mL via INTRAVENOUS

## 2024-01-23 MED ORDER — HYDROMORPHONE HCL 1 MG/ML IJ SOLN
0.5000 mg | Freq: Once | INTRAMUSCULAR | Status: AC
  Administered 2024-01-23: 0.5 mg via INTRAVENOUS
  Filled 2024-01-23: qty 1

## 2024-01-23 MED ORDER — OXYCODONE HCL 5 MG PO TABS: 5.0000 mg | ORAL_TABLET | Freq: Four times a day (QID) | ORAL | 0 refills | Status: DC | PRN

## 2024-01-23 NOTE — Discharge Instructions (Signed)
 Your workup was reassuring.  Take the pain medicine help with the pain.  The sling can also help with the pain.  Follow-up with orthopedic surgery.  He can either follow with Dr. Beverley or one of the surgeons you have seen previously.

## 2024-01-23 NOTE — ED Provider Notes (Signed)
 [place] EMERGENCY DEPARTMENT AT [facility] Provider Note   CSN: [id number] Arrival date & time: 01/23/24  1223     Patient presents with: [place]   EVALUNA UTKE is a 78 y.o. female.    Fall  Patient presents after fall.  Reportedly fell out of bed.  Is on Plavix .  Came in as a level 2 trauma.  Complaining of pain mostly on her right side.  Right shoulder and right elbow right knee.  States that she has had previous falls.  Had to be placed after the falls.  Has had previous fractures.     Prior to Admission medications   Medication Sig Start Date End Date Taking? Authorizing Provider  albuterol  (PROVENTIL ) (2.5 MG/3ML) 0.083% nebulizer solution USE 1 VIAL IN NEBULIZER EVERY 6 HOURS AND AS NEEDED FOR SHORT BREATH/WHEEZE.  Generic:  Ventolin  09/30/23   Meade Verdon RAMAN, MD  allopurinol  (ZYLOPRIM ) 300 MG tablet Take 1 tablet by mouth once daily 09/02/23   Geofm Glade PARAS, MD  amoxicillin -clavulanate (AUGMENTIN ) 875-125 MG tablet Take 1 tablet by mouth 2 (two) times daily for 7 days. 01/20/24 01/27/24  Geofm Glade PARAS, MD  Blood Glucose Monitoring Suppl DEVI May substitute to any manufacturer covered by patient's insurance. Patient request Accu-check meter and testing suppliles 12/06/23   Burns, Glade PARAS, MD  budesonide -formoterol  (SYMBICORT ) 160-4.5 MCG/ACT inhaler Inhale 2 puffs into the lungs 2 (two) times daily. 10/13/23   Ghimire, Donalda HERO, MD  cholecalciferol (VITAMIN D3) 25 MCG (1000 UNIT) tablet Take 2,000 Units by mouth daily.    [provider]  clopidogrel  (PLAVIX ) 75 MG tablet Take 1 tablet by mouth once daily 09/02/23   Geofm Glade PARAS, MD  dapagliflozin  propanediol (FARXIGA ) 10 MG TABS tablet Take 1 tablet (10 mg total) by mouth daily. 01/14/24   Geofm Glade PARAS, MD  diclofenac  Sodium (VOLTAREN ) 1 % GEL Apply 2 g topically 4 (four) times daily. 12/28/23   Gonfa, Taye T, MD  DULoxetine  (CYMBALTA ) 30 MG capsule Take 2 capsules (60 mg total) by mouth daily. 12/05/23    Geofm Glade PARAS, MD  ferrous sulfate  325 (65 FE) MG tablet Take 325 mg by mouth every other day.    [provider]  fluticasone  (FLONASE ) 50 MCG/ACT nasal spray Place 2 sprays into both nostrils daily. 10/03/23   Soldatova, Liuba, MD  Glucose Blood (BLOOD GLUCOSE TEST STRIPS) STRP May substitute to any manufacturer covered by patient's insurance. Patient to test glucose 2-3 times daily as needed 12/06/23   Geofm Glade PARAS, MD  Lancet Device MISC May substitute to any manufacturer covered by patient's insurance.  Patient to test glucose 2-3 times daily as needed 12/06/23   Geofm Glade PARAS, MD  levocetirizine (XYZAL ) 5 MG tablet SMARTSIG:1 Tablet(s) By Mouth Every Evening 12/04/23   [provider]  metoprolol  succinate (TOPROL -XL) 25 MG 24 hr tablet Take 1 tablet (25 mg total) by mouth daily. 12/26/23   Gonfa, Taye T, MD  midodrine  (PROAMATINE ) 5 MG tablet Take 1 tablet (5 mg total) by mouth 3 (three) times daily with meals. 12/29/23   Gonfa, Taye T, MD  montelukast  (SINGULAIR ) 10 MG tablet TAKE 1 TABLET BY MOUTH ONCE DAILY AT BEDTIME 07/25/23   Burns, Glade PARAS, MD  MOUNJARO  5 MG/0.5ML Pen INJECT 1/2 (ONE-HALF) ML SUBCUTANEOUSLY  ONCE A WEEK 01/16/24   Burns, Glade PARAS, MD  oxyCODONE  (ROXICODONE ) 5 MG immediate release tablet Take 1 tablet (5 mg total) by mouth every 6 (six)  hours as needed for severe pain (pain score 7-10) (Chronic left leg pain). 01/23/24   Patsey Lot, MD  pantoprazole  (PROTONIX ) 40 MG tablet Take 1 tablet (40 mg total) by mouth 2 (two) times daily before a meal. Patient taking differently: Take 40 mg by mouth daily. 02/01/23   Geofm Glade PARAS, MD  polyethylene glycol (MIRALAX  / GLYCOLAX ) 17 g packet Take 17 g by mouth 2 (two) times daily. 10/31/23   Christobal Guadalajara, MD  rosuvastatin  (CRESTOR ) 10 MG tablet Take 1 tablet (10 mg total) by mouth daily. 12/26/23   Gonfa, Taye T, MD  sodium chloride  (OCEAN) 0.65 % SOLN nasal spray Place 1 spray into both nostrils as needed for  congestion. 07/29/23   Desai, Nikita S, MD  tiotropium (SPIRIVA  HANDIHALER) 18 MCG inhalation capsule Place 1 capsule (18 mcg total) into inhaler and inhale daily. 10/13/23 10/12/24  GhimireDonalda HERO, MD    Allergies: Gabapentin, Clindamycin , and Sulfa antibiotics    Review of Systems  Updated Vital Signs BP 107/78   Pulse 99   Temp 98.3 F (36.8 C)   Resp (!) 27   Ht 5' 5 (1.651 m)   Wt 86.2 kg   SpO2 100%   BMI 31.62 kg/m   Physical Exam Vitals and nursing note reviewed.  HENT:     Head: Atraumatic.  Eyes:     Pupils: Pupils are equal, round, and reactive to light.  Cardiovascular:     Rate and Rhythm: Regular rhythm.  Pulmonary:     Comments: Left lateral chest wall. Chest:     Chest wall: Tenderness present.  Musculoskeletal:     Cervical back: Neck supple. No tenderness.     Comments: Tenderness right shoulder proximally and posteriorly.  Tenderness right elbow.  Some pain with movement.  Tenderness to right knee with no large deformity.  No hip tenderness.  No lumbar spine tenderness.  Neurological:     Mental Status: She is alert. Mental status is at baseline.     (all labs ordered are listed, but only abnormal results are displayed) Labs Reviewed  CBC - Abnormal; Notable for the following components:      Result Value   RBC 3.49 (*)    Hemoglobin 10.2 (*)    HCT 33.4 (*)    RDW 16.2 (*)    All other components within normal limits  I-STAT CHEM 8, ED - Abnormal; Notable for the following components:   Creatinine, Ser 1.50 (*)    Glucose, Bld 101 (*)    Calcium , Ion 1.07 (*)    Hemoglobin 11.6 (*)    HCT 34.0 (*)    All other components within normal limits    EKG: None  Radiology: CT CHEST ABDOMEN PELVIS W CONTRAST Result Date: 01/23/2024 CLINICAL DATA:  Status post fall from bed with right shoulder and chronic hip pain EXAM: CT CHEST, ABDOMEN, AND PELVIS WITH CONTRAST TECHNIQUE: Multidetector CT imaging of the chest, abdomen and pelvis was  performed following the standard protocol during bolus administration of intravenous contrast. RADIATION DOSE REDUCTION: This exam was performed according to the departmental dose-optimization program which includes automated exposure control, adjustment of the mA and/or kV according to patient size and/or use of iterative reconstruction technique. CONTRAST:  75mL OMNIPAQUE  IOHEXOL  350 MG/ML SOLN COMPARISON:  Same day chest radiograph, CTA chest, abdomen, and pelvis dated 12/04/2023 FINDINGS: CT CHEST FINDINGS Cardiovascular: Left atrial enlargement. No significant pericardial fluid/thickening. Great vessels are normal in course and caliber. No central pulmonary  emboli. Coronary artery calcifications and aortic atherosclerosis. Similar irregular mural thrombus throughout the descending thoracic aorta. Mediastinum/Nodes: Imaged thyroid  gland without nodules meeting criteria for imaging follow-up by size. Normal esophagus. Unchanged 12 mm right hilar lymph node (5:26). Lungs/Pleura: The central airways are patent. Severe centrilobular and paraseptal emphysema. Bandlike linear atelectasis/scarring along peripheral left lingula and left lower lobe. Biapical pleural-parenchymal scarring. No pneumothorax. No pleural effusion. Musculoskeletal: No acute or abnormal lytic or blastic osseous lesions. Old fractures of the left anterolateral fifth through seventh ribs. No definite right scapular fracture. Focal soft tissue stranding posterior to the right inferior scapula (5:13). CT ABDOMEN PELVIS FINDINGS Hepatobiliary: Subcentimeter central segment 6 hypodensity (5:60), too small to characterize but unchanged. Previously noted foci of arterial enhancement are not seen on today's examination. An additional subcentimeter hypodensity within peripheral segment 5/6 (5:61) is also too small to characterize. Subtle focus of arterial enhancement of the right hepatic dome (5:46) may be perfusional or represent a small venous  malformation. No intra or extrahepatic biliary ductal dilation. Normal gallbladder. Pancreas: No focal lesions or main ductal dilation. Spleen: Normal in size without focal abnormality. Adrenals/Urinary Tract: No adrenal nodules. Similar atrophic left kidney. No hydronephrosis or calculi. Multifocal right renal cortical scarring. No suspicious renal masses. Mild mural thickening of the urinary bladder. Stomach/Bowel: Normal appearance of the stomach. Duodenal diverticula arising from the proximal duodenum. No evidence of bowel wall thickening, distention, or inflammatory changes. Appendix is not discretely seen. Vascular/Lymphatic: Aortic atherosclerosis. No enlarged abdominal or pelvic lymph nodes. Reproductive: No adnexal masses. Other: No free fluid, fluid collection, or free air. Musculoskeletal: No acute or abnormal lytic or blastic osseous findings. Fracture deformity of the left proximal femur with postsurgical changes of proximal left femoral fixation. Hardware appears intact. Foci of subcutaneous stranding extending along the right posteroinferior chest into the right flank (5:41-60). IMPRESSION: 1. Foci of subcutaneous stranding extending along the right posteroinferior chest into the right flank, likely posttraumatic. No acute fracture. 2. No acute traumatic injury in the chest, abdomen, or pelvis. Electronically Signed   By: Limin  Xu M.D.   On: 01/23/2024 14:18   CT HEAD WO CONTRAST ( ) Result Date: 01/23/2024 EXAM: CT HEAD AND CERVICAL SPINE 01/23/2024 01:47:31 PM TECHNIQUE: CT of the head and cervical spine was performed without the administration of intravenous contrast. Multiplanar reformatted images are provided for review. Automated exposure control, iterative reconstruction, and/or weight based adjustment of the mA/kV was utilized to reduce the radiation dose to as low as reasonably achievable. COMPARISON: None available. CLINICAL HISTORY: Head trauma, minor (Age >= 65y) FINDINGS: CT HEAD  BRAIN AND VENTRICLES: No acute intracranial hemorrhage. No mass effect or midline shift. No abnormal extra-axial fluid collection. No evidence of acute infarct. No hydrocephalus. ORBITS: No acute abnormality. SINUSES AND MASTOIDS: No acute abnormality. SOFT TISSUES AND SKULL: No acute skull fracture. No acute soft tissue abnormality. CT CERVICAL SPINE BONES AND ALIGNMENT: No acute fracture or traumatic malalignment. Similar mild degenerative step-wise anterolisthesis C2-C3, C4-C5, C5-C6, C6-C7, C7-T1, and T1-T2. DEGENERATIVE CHANGES: Similar severe facet and uncovertebral hypertrophy with resulting varying degrees of neural foraminal stenosis. SOFT TISSUES: No prevertebral soft tissue swelling. IMPRESSION: 1. No acute intracranial abnormality. 2. No acute fracture or traumatic malalignment of the cervical spine. Electronically signed by: Gilmore Molt MD 01/23/2024 02:01 PM EST RP Workstation: [id number]   CT Cervical Spine Wo Contrast Result Date: 01/23/2024 EXAM: CT HEAD AND CERVICAL SPINE 01/23/2024 01:47:31 PM TECHNIQUE: CT of the head and cervical spine was performed without  the administration of intravenous contrast. Multiplanar reformatted images are provided for review. Automated exposure control, iterative reconstruction, and/or weight based adjustment of the mA/kV was utilized to reduce the radiation dose to as low as reasonably achievable. COMPARISON: None available. CLINICAL HISTORY: Head trauma, minor (Age >= 65y) FINDINGS: CT HEAD BRAIN AND VENTRICLES: No acute intracranial hemorrhage. No mass effect or midline shift. No abnormal extra-axial fluid collection. No evidence of acute infarct. No hydrocephalus. ORBITS: No acute abnormality. SINUSES AND MASTOIDS: No acute abnormality. SOFT TISSUES AND SKULL: No acute skull fracture. No acute soft tissue abnormality. CT CERVICAL SPINE BONES AND ALIGNMENT: No acute fracture or traumatic malalignment. Similar mild degenerative step-wise anterolisthesis  C2-C3, C4-C5, C5-C6, C6-C7, C7-T1, and T1-T2. DEGENERATIVE CHANGES: Similar severe facet and uncovertebral hypertrophy with resulting varying degrees of neural foraminal stenosis. SOFT TISSUES: No prevertebral soft tissue swelling. IMPRESSION: 1. No acute intracranial abnormality. 2. No acute fracture or traumatic malalignment of the cervical spine. Electronically signed by: Gilmore Molt MD 01/23/2024 02:01 PM EST RP Workstation: [id number]   DG Knee Complete 4 Views Right Result Date: 01/23/2024 CLINICAL DATA:  Fall.  Knee trauma and pain. EXAM: RIGHT KNEE - COMPLETE 4+ VIEW COMPARISON:  None Available. FINDINGS: No evidence of fracture or dislocation. Probable small knee joint effusion noted. Early degenerative spurring of patella noted. No other osseous abnormality identified. Peripheral vascular calcification noted. IMPRESSION: No evidence of fracture or dislocation. Probable small knee joint effusion. Electronically Signed   By: Norleen DELENA Kil M.D.   On: 01/23/2024 12:51   DG Elbow Complete Right Result Date: 01/23/2024 CLINICAL DATA:  Fall.  Elbow injury and pain. EXAM: RIGHT ELBOW - COMPLETE 3+ VIEW COMPARISON:  None Available. FINDINGS: There is no evidence of fracture, dislocation, or joint effusion. There is no evidence of arthropathy or other focal bone abnormality. Soft tissues are unremarkable. IMPRESSION: Negative. Electronically Signed   By: Norleen DELENA Kil M.D.   On: 01/23/2024 12:49   DG Shoulder Right Result Date: 01/23/2024 CLINICAL DATA:  Fall.  Right shoulder injury and pain. EXAM: RIGHT SHOULDER - 2+ VIEW COMPARISON:  None Available. FINDINGS: Nondisplaced fracture is seen involving the body of the scapula just inferior to the glenoid. No other fractures identified. No No evidence of dislocation. Mild acromioclavicular degenerative changes noted. IMPRESSION: Nondisplaced fracture of scapular body. Electronically Signed   By: Norleen DELENA Kil M.D.   On: 01/23/2024 12:47   DG Chest  Portable 1 View Result Date: 01/23/2024 CLINICAL DATA:  Clemens out of bed this morning.  Right shoulder pain. EXAM: PORTABLE CHEST 1 VIEW COMPARISON:  12/23/2023. FINDINGS: Cardiac silhouette is normal in size. No mediastinal or hilar masses. Chronic prominence of the bronchovascular markings. Lungs are mildly hyperexpanded but otherwise clear. No pleural effusion or pneumothorax. Apparent acute fracture of the lateral left seventh rib, possibly also of the adjacent sixth rib. Possible right scapular fracture. IMPRESSION: 1. No acute cardiopulmonary disease. 2. Fracture of the lateral left seventh rib. Possible right scapular fracture. Electronically Signed   By: Alm Parkins M.D.   On: 01/23/2024 12:43   DG Pelvis Portable Result Date: 01/23/2024 CLINICAL DATA:  Fall.  Pelvic pain. EXAM: PORTABLE PELVIS 1 VIEWS COMPARISON:  None Available. FINDINGS: There is no evidence of pelvic fracture or diastasis. Old left hip fracture deformity again seen with internal fixation hardware. IMPRESSION: No acute findings. Electronically Signed   By: Norleen DELENA Kil M.D.   On: 01/23/2024 12:42   DG HIPS BILAT WITH PELVIS MIN 5 VIEWS Result Date:  01/22/2024 CLINICAL DATA:  fall, pain r/o fx EXAM: DG HIP (WITH OR WITHOUT PELVIS) 5+V BILAT COMPARISON:  12/21/2023 FINDINGS: No evidence of pelvic fracture or diastasis.Left femoral intramedullary nail again noted. Posttraumatic changes of the left intertrochanteric region. Subtle lucency along the medial femoral neck and involving the lesser trochanter. There is no evidence of arthropathy or other focal bone abnormality. Soft tissues are unremarkable. IMPRESSION: Subtle lucency along the medial femoral neck and involving the lesser trochanter, which could represent a subtle, recurrent femoral neck fracture. Electronically Signed   By: Rogelia Myers M.D.   On: 01/22/2024 17:59   DG Foot Complete Right Result Date: 01/22/2024 CLINICAL DATA:  pain from fall EXAM: RIGHT FOOT  COMPLETE - 3+ VIEW COMPARISON:  February 19, 2023 FINDINGS: No acute fracture or dislocation. There is no evidence of arthropathy or other focal bone abnormality. Mild soft tissue swelling about the dorsum of the foot. No radiopaque foreign body. IMPRESSION: Mild soft tissue swelling about the dorsum of the foot. No acute fracture or dislocation. Electronically Signed   By: Rogelia Myers M.D.   On: 01/22/2024 17:54     Procedures   Medications Ordered in the ED  iohexol  (OMNIPAQUE ) 350 MG/ML injection 75 mL (75 mLs Intravenous Contrast Given 01/23/24 1349)  HYDROmorphone  (DILAUDID ) injection 0.5 mg (0.5 mg Intravenous Given 01/23/24 1401)                                    Medical Decision Making Amount and/or Complexity of Data Reviewed Labs: ordered. Radiology: ordered.  Risk Prescription drug management.   Patient with fall.  On antiplatelet.  Has tenderness to right shoulder left chest wall right elbow right knee.  Initial x-rays show rib fracture and scapular fracture.  Will get CT scans to further evaluate.  CT scans reassuring.  Negative rib fractures or shoulder fracture.  Does have some contusion.  Will treat with sling.  Follow-up with Ortho either the doctor on-call or her doctor.  Will give pain medicines that she been on previously.  Dr. Jackquline aware of patient in case cannot make it home.  Patient's son is here and thinks they should be able to manage.       Final diagnoses:  Fall, initial encounter  Contusion of right shoulder, initial encounter    ED Discharge Orders          Ordered    oxyCODONE  (ROXICODONE ) 5 MG immediate release tablet  Every 6 hours PRN        01/23/24 1457               Patsey Lot, MD 01/23/24 1513

## 2024-01-23 NOTE — Progress Notes (Signed)
 Orthopedic Tech Progress Note Patient Details:  Kerry Powell Dec 25, 1945 [id number]  Level II trauma, no ortho tech orders at this time.  Patient ID: Kerry Powell, female   DOB: Jun 08, 1945, 78 y.o.   MRN: [id number]  Kerry Powell 01/23/2024, 1:08 PM

## 2024-01-23 NOTE — ED Triage Notes (Signed)
 Patient present to the ER via EMS from home with a fall out of bed from at 0600. Patient reports hitting her head, no LOC, patient is on blood thinners. Patient c/o head, and right shoulder pain. Patient states that she has chronic hip pain. Patient awake and alert in ER.

## 2024-01-23 NOTE — Progress Notes (Signed)
 Orthopedic Tech Progress Note Patient Details:  Kerry Powell Oct 12, 1945 [id number]  Ortho Devices Type of Ortho Device: Arm sling Ortho Device/Splint Location: RUE Ortho Device/Splint Interventions: Ordered, Application, Adjustment   Post Interventions Patient Tolerated: Well Instructions Provided: Care of device, Adjustment of device  [name] Ronal Brasil 01/23/2024, 4:01 PM

## 2024-01-24 NOTE — Telephone Encounter (Signed)
 Spoke with pt today. Pt has not had a chance to call [facility],but said she will be calling and will call back if she needs pap mail out.

## 2024-01-27 ENCOUNTER — Ambulatory Visit: Payer: Self-pay | Admitting: Internal Medicine

## 2024-01-27 ENCOUNTER — Encounter (HOSPITAL_BASED_OUTPATIENT_CLINIC_OR_DEPARTMENT_OTHER): Payer: Self-pay

## 2024-01-27 ENCOUNTER — Telehealth: Payer: Self-pay

## 2024-01-27 ENCOUNTER — Emergency Department (HOSPITAL_BASED_OUTPATIENT_CLINIC_OR_DEPARTMENT_OTHER)
Admission: EM | Admit: 2024-01-27 | Discharge: 2024-01-27 | Disposition: A | Discharge status: Home / Self Care | Attending: Emergency Medicine | Admitting: Emergency Medicine

## 2024-01-27 ENCOUNTER — Other Ambulatory Visit: Payer: Self-pay

## 2024-01-27 ENCOUNTER — Emergency Department (HOSPITAL_BASED_OUTPATIENT_CLINIC_OR_DEPARTMENT_OTHER)

## 2024-01-27 DIAGNOSIS — S42101A Fracture of unspecified part of scapula, right shoulder, initial encounter for closed fracture: Secondary | ICD-10-CM | POA: Insufficient documentation

## 2024-01-27 DIAGNOSIS — M25551 Pain in right hip: Secondary | ICD-10-CM | POA: Insufficient documentation

## 2024-01-27 DIAGNOSIS — Z7901 Long term (current) use of anticoagulants: Secondary | ICD-10-CM | POA: Insufficient documentation

## 2024-01-27 DIAGNOSIS — W06XXXA Fall from bed, initial encounter: Secondary | ICD-10-CM | POA: Insufficient documentation

## 2024-01-27 LAB — CBC
HCT: 34.2 % — ABNORMAL LOW (ref 36.0–46.0)
Hemoglobin: 10.7 g/dL — ABNORMAL LOW (ref 12.0–15.0)
MCH: 28.9 pg (ref 26.0–34.0)
MCHC: 31.3 g/dL (ref 30.0–36.0)
MCV: 92.4 fL (ref 80.0–100.0)
Platelets: 394 K/uL (ref 150–400)
RBC: 3.7 MIL/uL — ABNORMAL LOW (ref 3.87–5.11)
RDW: 15.7 % — ABNORMAL HIGH (ref 11.5–15.5)
WBC: 10.3 K/uL (ref 4.0–10.5)
nRBC: 0 % (ref 0.0–0.2)

## 2024-01-27 LAB — BASIC METABOLIC PANEL WITH GFR
Anion gap: 10 (ref 5–15)
BUN: 11 mg/dL (ref 8–23)
CO2: 27 mmol/L (ref 22–32)
Calcium: 9.2 mg/dL (ref 8.9–10.3)
Chloride: 99 mmol/L (ref 98–111)
Creatinine, Ser: 1.31 mg/dL — ABNORMAL HIGH (ref 0.44–1.00)
GFR, Estimated: 42 mL/min — ABNORMAL LOW (ref >60)
Glucose, Bld: 103 mg/dL — ABNORMAL HIGH (ref 70–99)
Potassium: 3.6 mmol/L (ref 3.5–5.1)
Sodium: 136 mmol/L (ref 135–145)

## 2024-01-27 MED ORDER — OXYCODONE HCL 5 MG PO TABS
5.0000 mg | ORAL_TABLET | Freq: Once | ORAL | Status: AC
  Administered 2024-01-27: 5 mg via ORAL
  Filled 2024-01-27: qty 1

## 2024-01-27 MED ORDER — OXYCODONE HCL 5 MG PO TABS: 5.0000 mg | ORAL_TABLET | Freq: Four times a day (QID) | ORAL | 0 refills | Status: DC | PRN

## 2024-01-27 NOTE — Discharge Instructions (Signed)
 Please wear the arm sling at all times.  Do not use your right arm for any lifting or weightbearing.  You will need to follow-up with an orthopedic surgeon in the office.  You have a fracture or broken bone in your right scapula.  Your primary care provider sent a prescription for oxycodone  pain medicine to your pharmacy

## 2024-01-27 NOTE — ED Notes (Signed)
 Patient transported to CT

## 2024-01-27 NOTE — Telephone Encounter (Signed)
 Copied from CRM #[id number]. Topic: Clinical - Medical Advice >> Jan 27, 2024  3:56 PM Rea ORN wrote: Reason for CRM: Amy with [facility] called to advise the pt had a fall and went to ED last week. Pt has several possible fractures but the tests all came back inconclusive. Pt was given a sling for her shoulder at discharge. Because of the sling, Amy is requesting an order for a wheelchair. Her insurance denied a wheelchair previously due to her being ambulatory. Pt now has a sling and is unable to use a walker.   Please call back if there are any additional questions for Amy [phone]

## 2024-01-27 NOTE — ED Notes (Signed)
 Pt advises being on oxygen when I need it. Reports 2L/min. Placed on same, O2 improved to 94%

## 2024-01-27 NOTE — ED Notes (Signed)
  at bedside

## 2024-01-27 NOTE — ED Provider Notes (Signed)
 [place] EMERGENCY DEPARTMENT AT [facility] Provider Note   CSN: [id number] Arrival date & time: 01/27/24  1752     Patient presents with: Shoulder Pain (R) and Hip Pain (R)   Kerry Powell is a 78 y.o. female presented to ED with persistent pain in her right shoulder and right hip after a fall about a week ago.  Patient reports she rolled out of bed at that time and landed on her right side.  She came into the ED and had significant trauma imaging which included CT imaging of the chest abdomen pelvis, x-ray of the hips and the right shoulder.  There was question about a possible scapular fracture on her x-ray imaging although the CT did not confirm this.  She says she continues to have significant pain in her right shoulder.  She has been in her arm sling but cannot move her shoulder more than a few inches without severe pain.  She was prescribed oxycodone  but says it is not helping her pain.  She does report a history of a left hip replacement or prosthesis about 4 months ago with an orthopedic surgeon, but says that her pain never got better and she was unable to walk on that hip.  She is now having pain predominantly on the right hip.  Her son is present at the bedside and said he has been needing to give her full 24-hour care, needing to lift her full weight for any type of movement around the house.   HPI     Prior to Admission medications   Medication Sig Start Date End Date Taking? Authorizing Provider  albuterol  (PROVENTIL ) (2.5 MG/3ML) 0.083% nebulizer solution USE 1 VIAL IN NEBULIZER EVERY 6 HOURS AND AS NEEDED FOR SHORT BREATH/WHEEZE.  Generic:  Ventolin  09/30/23   Meade Verdon RAMAN, MD  allopurinol  (ZYLOPRIM ) 300 MG tablet Take 1 tablet by mouth once daily 09/02/23   Geofm Glade PARAS, MD  amoxicillin -clavulanate (AUGMENTIN ) 875-125 MG tablet Take 1 tablet by mouth 2 (two) times daily for 7 days. 01/20/24 01/27/24  Geofm Glade PARAS, MD  Blood Glucose Monitoring Suppl DEVI May  substitute to any manufacturer covered by patient's insurance. Patient request Accu-check meter and testing suppliles 12/06/23   Burns, Glade PARAS, MD  budesonide -formoterol  (SYMBICORT ) 160-4.5 MCG/ACT inhaler Inhale 2 puffs into the lungs 2 (two) times daily. 10/13/23   Ghimire, Donalda HERO, MD  cholecalciferol (VITAMIN D3) 25 MCG (1000 UNIT) tablet Take 2,000 Units by mouth daily.    [provider]  clopidogrel  (PLAVIX ) 75 MG tablet Take 1 tablet by mouth once daily 09/02/23   Geofm Glade PARAS, MD  dapagliflozin  propanediol (FARXIGA ) 10 MG TABS tablet Take 1 tablet (10 mg total) by mouth daily. 01/14/24   Geofm Glade PARAS, MD  diclofenac  Sodium (VOLTAREN ) 1 % GEL Apply 2 g topically 4 (four) times daily. 12/28/23   Gonfa, Taye T, MD  DULoxetine  (CYMBALTA ) 30 MG capsule Take 2 capsules (60 mg total) by mouth daily. 12/05/23   Geofm Glade PARAS, MD  ferrous sulfate  325 (65 FE) MG tablet Take 325 mg by mouth every other day.    [provider]  fluticasone  (FLONASE ) 50 MCG/ACT nasal spray Place 2 sprays into both nostrils daily. 10/03/23   Soldatova, Liuba, MD  Glucose Blood (BLOOD GLUCOSE TEST STRIPS) STRP May substitute to any manufacturer covered by patient's insurance. Patient to test glucose 2-3 times daily as needed 12/06/23   Geofm Glade PARAS, MD  Lancet Device MISC  May substitute to any manufacturer covered by patient's insurance.  Patient to test glucose 2-3 times daily as needed 12/06/23   Geofm Glade PARAS, MD  levocetirizine (XYZAL ) 5 MG tablet SMARTSIG:1 Tablet(s) By Mouth Every Evening 12/04/23   [provider]  metoprolol  succinate (TOPROL -XL) 25 MG 24 hr tablet Take 1 tablet (25 mg total) by mouth daily. 12/26/23   Gonfa, Taye T, MD  midodrine  (PROAMATINE ) 5 MG tablet Take 1 tablet (5 mg total) by mouth 3 (three) times daily with meals. 12/29/23   Gonfa, Taye T, MD  montelukast  (SINGULAIR ) 10 MG tablet TAKE 1 TABLET BY MOUTH ONCE DAILY AT BEDTIME 07/25/23   Burns, Glade PARAS, MD   MOUNJARO  5 MG/0.5ML Pen INJECT 1/2 (ONE-HALF) ML SUBCUTANEOUSLY  ONCE A WEEK 01/16/24   Burns, Glade PARAS, MD  oxyCODONE  (ROXICODONE ) 5 MG immediate release tablet Take 1 tablet (5 mg total) by mouth every 6 (six) hours as needed for severe pain (pain score 7-10) (Chronic left leg pain). 01/27/24   Geofm Glade PARAS, MD  pantoprazole  (PROTONIX ) 40 MG tablet Take 1 tablet (40 mg total) by mouth 2 (two) times daily before a meal. Patient taking differently: Take 40 mg by mouth daily. 02/01/23   Geofm Glade PARAS, MD  polyethylene glycol (MIRALAX  / GLYCOLAX ) 17 g packet Take 17 g by mouth 2 (two) times daily. 10/31/23   Christobal Guadalajara, MD  rosuvastatin  (CRESTOR ) 10 MG tablet Take 1 tablet (10 mg total) by mouth daily. 12/26/23   Gonfa, Taye T, MD  sodium chloride  (OCEAN) 0.65 % SOLN nasal spray Place 1 spray into both nostrils as needed for congestion. 07/29/23   Desai, Nikita S, MD  tiotropium (SPIRIVA  HANDIHALER) 18 MCG inhalation capsule Place 1 capsule (18 mcg total) into inhaler and inhale daily. 10/13/23 10/12/24  GhimireDonalda HERO, MD    Allergies: Gabapentin, Clindamycin , and Sulfa antibiotics    Review of Systems  Updated Vital Signs BP (!) 105/93   Pulse (!) 103   Temp 98.3 F (36.8 C)   Resp 18   SpO2 (!) 89%   Physical Exam Constitutional:      General: She is not in acute distress. HENT:     Head: Normocephalic and atraumatic.  Eyes:     Conjunctiva/sclera: Conjunctivae normal.     Pupils: Pupils are equal, round, and reactive to light.  Cardiovascular:     Rate and Rhythm: Normal rate and regular rhythm.  Pulmonary:     Effort: Pulmonary effort is normal. No respiratory distress.     Comments: On chronic 3 L nasal cannula Abdominal:     General: There is no distension.     Tenderness: There is no abdominal tenderness.  Musculoskeletal:     Comments: Ecchymoses of the right sided thoracic back, with tenderness of the right scapula, patient is unable to range of motion at the right  shoulder.  She is able to flex and extend both lower extremities at the right hip, but does have some tenderness with palpation near the proximal femur on the right side.  Skin:    General: Skin is warm and dry.  Neurological:     General: No focal deficit present.     Mental Status: She is alert. Mental status is at baseline.  Psychiatric:        Mood and Affect: Mood normal.        Behavior: Behavior normal.     (all labs ordered are listed, but only abnormal results are  displayed) Labs Reviewed  BASIC METABOLIC PANEL WITH GFR - Abnormal; Notable for the following components:      Result Value   Glucose, Bld 103 (*)    Creatinine, Ser 1.31 (*)    GFR, Estimated 42 (*)    All other components within normal limits  CBC - Abnormal; Notable for the following components:   RBC 3.70 (*)    Hemoglobin 10.7 (*)    HCT 34.2 (*)    RDW 15.7 (*)    All other components within normal limits    EKG: None  Radiology: CT Chest Wo Contrast Result Date: 01/27/2024 EXAM: CT CHEST WITHOUT CONTRAST 01/27/2024 07:25:31 PM TECHNIQUE: CT of the chest was performed without the administration of intravenous contrast. Multiplanar reformatted images are provided for review. Automated exposure control, iterative reconstruction, and/or weight based adjustment of the mA/kV was utilized to reduce the radiation dose to as low as reasonably achievable. COMPARISON: 01/23/2024 CLINICAL HISTORY: Chest trauma, blunt; with attention to right scapula for possible fracture. FINDINGS: MEDIASTINUM: Normal heart size, without pericardial effusion. Normal aortic caliber. Thoracic aortic atherosclerosis. Tortuous descending thoracic aorta. Moderate cardiomegaly. 3 vessel coronary artery calcification. Pulmonary artery enlargement, outflow tract 3.1 cm. The central airways are clear. The esophagus is moderately dilated with fluid within including on image 44 of series 1. LYMPH NODES: Hilar regions poorly evaluated without iv  contrast. No axillary lymphadenopathy. LUNGS AND PLEURA: Clear lungs. No pneumothorax or pleural fluid. Mild left pleural thickening. Advanced centrilobular and paraseptal emphysema. Mild motion degradation involving the mid and lower chest. SOFT TISSUES/BONES: Increased intramuscular edema surrounding the right scapula with areas of mixed attenuation, suggesting hemorrhage. New comminuted scapular body fracture including on image 19 of series 1. No glenoid extension. Old lower left rib fractures. UPPER ABDOMEN: Moderate to marked left renal atrophy. Mild upper and interpolar right renal cortical scarring. Limited images of the upper abdomen demonstrates no acute abnormality. IMPRESSION: 1. New comminuted right scapular body fracture with increased surrounding muscular edema and new intramuscular hemorrhage. 2. No other acute posttraumatic deformity identified. Motion degradation is detailed above. 3. Esophageal fluid level suggests dysmotility and/or gastroesophageal reflux. 4. Aortic atherosclerosis (icd10-i70.0), coronary artery atherosclerosis, and emphysema (icd10-j43.9). 5. Pulmonary artery enlargement suggests pulmonary arterial hypertension. Electronically signed by: Rockey Kilts MD 01/27/2024 08:18 PM EST RP Workstation: [id number]   CT Hip Right Wo Contrast Result Date: 01/27/2024 CLINICAL DATA:  Status post trauma. EXAM: CT OF THE RIGHT HIP WITHOUT CONTRAST TECHNIQUE: Multidetector CT imaging of the right hip was performed according to the standard protocol. Multiplanar CT image reconstructions were also generated. RADIATION DOSE REDUCTION: This exam was performed according to the departmental dose-optimization program which includes automated exposure control, adjustment of the mA and/or kV according to patient size and/or use of iterative reconstruction technique. COMPARISON:  None Available. FINDINGS: Bones/Joint/Cartilage There is no evidence of an acute fracture or dislocation. Mild degenerative  changes are seen in the form of joint space narrowing. Ligaments Suboptimally assessed by CT. Muscles and Tendons Limited in evaluation and otherwise unremarkable. Soft tissues Mild subcutaneous inflammatory fat stranding is seen along the posterolateral aspect of the right hip. IMPRESSION: 1. No acute fracture or dislocation. Electronically Signed   By: Suzen Dials M.D.   On: 01/27/2024 20:06     Procedures   Medications Ordered in the ED  oxyCODONE  (Oxy IR/ROXICODONE ) immediate release tablet 5 mg (5 mg Oral Given 01/27/24 1859)    Clinical Course as of 01/27/24 2206  Mon Jan 27, 2024  2109 Dr Georgina Jaeger reports scapula fracture does not require emergent operation. Okay to maintain in sling and follow up outpatient. [MT]    Clinical Course User Index [MT] Wenceslao Loper, Donnice PARAS, MD                                 Medical Decision Making Amount and/or Complexity of Data Reviewed Labs: ordered. Radiology: ordered.  Risk Prescription drug management.   Patient is here with persistent pain after a fall about a week ago for which she was seen in the ED and had extensive trauma imaging.  We will repeat CT imaging of the chest to evaluate both for the scapula and for possible pneumonia complication as there is some mild increased oxygen demand here in the bed.  I have also ordered a CT dedicated study of the right hip where she is complaining of new additional pain.  I have ordered oral pain medicine for her and basic labs.  Her son present at bedside provided supplemental history.  Labs and CT imaging reviewed, notable for comminuted right sided scapular fracture.  No fracture of the right hip.  Patient was reassessed and her pain seems significantly improved with the oxycodone .  I clarified that she does not fact have oxycodone  prescribed for home, and appears from external record review that her PCP sent a new prescription in today which should be at the pharmacy.  They are advised  to use this sparingly to prevent side effects including confusion and dizziness, but she is needing this for additional pain control.  It seems to be quite helpful.  I consulted with orthopedics by phone, determined this is not an emergent surgical need.  I discussed with the patient and her son the options of observation stay in the hospital, possible PT evaluation for SNF -- although the patient herself does not want to go back to staff, they are concerned that her insurance would not cover another visit again, given her recent stay at [facility].  Her son is comfortable taking her home.  I reiterated with the patient she should not stand without assistance.  She does have home PT and home nursing set up already.  They are quite comfortable with going home and follow-up with her own orthopedic doctor.  Please note that the patient has chronic issues with intermittent hypoxia - this is not a new problem.  She breathes exclusively through her mouth, so nasal cannula is not very effective.  I dont think this is a PE or PNA issue from this evaluation.     Final diagnoses:  Closed fracture of right scapula, unspecified part of scapula, initial encounter    ED Discharge Orders     None          Cottie Donnice PARAS, MD 01/27/24 2208

## 2024-01-27 NOTE — ED Triage Notes (Signed)
 Pt c/o continued pain in R shoulder & R hip after fall 11/27. Advises follow up appt w PCP (Wednesday) & ortho are in the computer but I've not gone yet. Advises knowledge of refill oxy but has not been able to obtain.   Known fx in R scapula, ? Fx in R hip per PCP note.

## 2024-01-27 NOTE — ED Notes (Signed)
 Reviewed AVS/discharge instruction with patient. Time allotted for and all questions answered. Patient is agreeable for d/c and escorted to ed exit by staff.

## 2024-01-27 NOTE — Telephone Encounter (Signed)
 Oxycodone  sent into pharmacy.  Needs to use caution because this does increase the risk of falling.  I would advise that she does not walk independently if at all possible.   X-ray from last week shows possible fracture in right hip -we can discuss this at her upcoming appointment.  It does appear to be recurrent-this may have happened at some point-unsure when.

## 2024-01-27 NOTE — Telephone Encounter (Signed)
 FYI Only or Action Required?: Action required by provider: medication refill request.  Patient was last seen in primary care on 01/22/2024 by Geofm Glade PARAS, MD.  Called Nurse Triage reporting Fall.  Symptoms began today.  Interventions attempted: Prescription medications: oxycodone .  Symptoms are: unchanged.Pt. Reports she fell yesterday, but family states she fell 01/23/24 and was seen in ED. Requests refill on oxycodone , has appointment for follow up. Please advise pt. And family.   Triage Disposition: Call PCP Now  Patient/caregiver understands and will follow disposition?: Yes      Copied from CRM #[id number]. Topic: Clinical - Red Word Triage >> Jan 27, 2024  2:42 PM Thersia BROCKS wrote: [facility] that prompted transfer to Nurse Triage: Patient is just getting out the hospital for fall, stated she is in a lot of pain . Patient has a appointment with Dr.Burns on Wednesday , but stated she needs something now for the pain Reason for Disposition  [1] Caller has URGENT question AND [2] triager unable to answer question  Answer Assessment - Initial Assessment Questions 1. MECHANISM: How did the fall happen?     Clemens out of bed 2. DOMESTIC VIOLENCE AND ELDER ABUSE SCREENING: Did you fall because someone pushed you or tried to hurt you? If Yes, ask: Are you safe now?     no 3. ONSET: When did the fall happen? (e.g., minutes, hours, or days ago)     yesterday 4. LOCATION: What part of the body hit the ground? (e.g., back, buttocks, head, hips, knees, hands, head, stomach)     On head 5. INJURY: Did you hurt (injure) yourself when you fell? If Yes, ask: What did you injure? Tell me more about this? (e.g., body area; type of injury; pain severity)     Yes, right shoulder hurts 6. PAIN: Is there any pain? If Yes, ask: How bad is the pain? (e.g., Scale 0-10; or none, mild,      10 7. SIZE: For cuts, bruises, or swelling, ask: How large is it? (e.g., inches or centimeters)       N/a 8. PREGNANCY: Is there any chance you are pregnant? When was your last menstrual period?     no 9. OTHER SYMPTOMS: Do you have any other symptoms? (e.g., dizziness, fever, weakness; new-onset or worsening).      no 10. CAUSE: What do you think caused the fall (or falling)? (e.g., dizzy spell, tripped)       Clemens out of bed  Protocols used: Falls and [facility]

## 2024-01-27 NOTE — ED Notes (Signed)
 Patient placed on Venti Mask (31%) due to desaturations while sleeping with mouth open, per family request

## 2024-01-28 ENCOUNTER — Encounter: Payer: Self-pay | Admitting: Internal Medicine

## 2024-01-28 ENCOUNTER — Telehealth: Payer: Self-pay | Admitting: Internal Medicine

## 2024-01-28 DIAGNOSIS — S42101A Fracture of unspecified part of scapula, right shoulder, initial encounter for closed fracture: Secondary | ICD-10-CM | POA: Insufficient documentation

## 2024-01-28 NOTE — Telephone Encounter (Signed)
 A phone call from [facility] with Kerry Powell has called in to report this patient now has an elevated fall score. As reported from [facility] the pt states she fell on thanksgiving, subsequently went to the ER and then an expedited x-ray facility. Per Ronal, pt reports a fractured tailbone.

## 2024-01-28 NOTE — Telephone Encounter (Signed)
 Fall already noted and patient being seen tomorrow.

## 2024-01-28 NOTE — Telephone Encounter (Signed)
 Notified patient of medication, X-ray, she said she fell again last night and went to the Emergence department had a ct scan and Ct that showed  two breaks in right shoulder  as well as her hip break. She now agrees to hip surgery and will be in to talk to Dr. Geofm about it on 12/3

## 2024-01-28 NOTE — Progress Notes (Unsigned)
 Subjective:    Patient ID: Kerry Powell, female    DOB: 27-Jul-1945, 78 y.o.   MRN: [id number]     HPI Kerry Powell is here for follow up of her chronic medical problems.  2 days ago - went to ED for persistent pain in R shoulder and R hip after her fall last week.  Found to have R scapular fracture with intramuscular hemorrhage.   Ct R hip w/o fracture.   Stopping Farxiga -urinary symptoms are improved, but still there  Tongue sewlling - held spiriva  -there has been some improvement   Cant think of the words, can't speak full sentences. Vocab and dialect is worse.  This all started after she hit her head  Coughing   Not sleeping at all - hallucinations worse with pain medication.  Scared to sleep  Scared to walk, stand.  She is afraid of falling.  She is afraid to fall asleep because she is afraid she may fall off the bed again or not wake up from sleep.  Not sleeping well in addition to that because of hallucinations.  She did have some hallucinations prior to the pain medications, but those did not make it worse.  Breathing through mouth - O2 drops to 80-81% on RA-wearing oxygen via nasal cannula  Discussed the use of AI scribe software for clinical note transcription with the patient, who gave verbal consent to proceed.  History of Present Illness Kerry Powell is a 78 year old female with a history of head trauma and multiple falls who presents with speech difficulties and hallucinations.  She is here with her son and her son's girlfriend.  She experiences speech difficulties, describing her speech as 'wacky' and having trouble finding words. These issues began after a head injury from a fall where she hit her head flat on the floor. She has a history of fluid on the right side of her head, which she describes as requiring intervention. Her voice has changed, and she experiences constant coughing.  She describes a significant fall where she landed on her head, resulting in a  large knot that has since developed a scab. She felt a 'crunch' sensation during the fall and notes bruising and a knot on her head. She experiences breathlessness and difficulty breathing, especially when not using oxygen, which drops her oxygen levels to the low 80s.  She has stopped taking Spiriva , which has reduced tongue swelling but not improved her speech. She has also stopped Farxiga , which has slightly improved her bladder symptoms, though she still experiences cloudy urine. She is not currently on Lasix .  She experiences hallucinations, which she believes started with pain medication use. She is currently on oxycodone  for pain management, which helps her relax and focus on tasks for short periods. However, she is concerned about the hallucinations and is not sleeping well, often fighting sleep due to fear of falling and fear of not waking up.  She reports muscle spasms, primarily in her legs, which can be severe at times.  She was on muscle relaxers, but when in rehab this last time they were discontinued secondary to low BP. She uses heating pads for relief, which have helped her sleep for short periods. She is also experiencing difficulty with eating and maintaining nutrition.  She expresses fear of falling and has difficulty standing and walking, which has impacted her confidence. She is concerned about her ability to manage daily activities and is worried about the impact on her family.  Medications and allergies reviewed with patient and updated if appropriate.  Current Outpatient Medications on File Prior to Visit  Medication Sig Dispense Refill   albuterol  (PROVENTIL ) (2.5 MG/3ML) 0.083% nebulizer solution USE 1 VIAL IN NEBULIZER EVERY 6 HOURS AND AS NEEDED FOR SHORT BREATH/WHEEZE.  Generic:  Ventolin  75 mL 2   allopurinol  (ZYLOPRIM ) 300 MG tablet Take 1 tablet by mouth once daily 90 tablet 0   Blood Glucose Monitoring Suppl DEVI May substitute to any manufacturer covered by  patient's insurance. Patient request Accu-check meter and testing suppliles 1 kit 0   budesonide -formoterol  (SYMBICORT ) 160-4.5 MCG/ACT inhaler Inhale 2 puffs into the lungs 2 (two) times daily. 1 each 12   cholecalciferol (VITAMIN D3) 25 MCG (1000 UNIT) tablet Take 2,000 Units by mouth daily.     clopidogrel  (PLAVIX ) 75 MG tablet Take 1 tablet by mouth once daily 90 tablet 0   diclofenac  Sodium (VOLTAREN ) 1 % GEL Apply 2 g topically 4 (four) times daily.     DULoxetine  (CYMBALTA ) 30 MG capsule Take 2 capsules (60 mg total) by mouth daily. 60 capsule 0   ferrous sulfate  325 (65 FE) MG tablet Take 325 mg by mouth every other day.     fluticasone  (FLONASE ) 50 MCG/ACT nasal spray Place 2 sprays into both nostrils daily. 16 g 6   Glucose Blood (BLOOD GLUCOSE TEST STRIPS) STRP May substitute to any manufacturer covered by patient's insurance. Patient to test glucose 2-3 times daily as needed 200 strip 11   Lancet Device MISC May substitute to any manufacturer covered by patient's insurance.  Patient to test glucose 2-3 times daily as needed 1 each 11   levocetirizine (XYZAL ) 5 MG tablet SMARTSIG:1 Tablet(s) By Mouth Every Evening     metoprolol  succinate (TOPROL -XL) 25 MG 24 hr tablet Take 1 tablet (25 mg total) by mouth daily.     midodrine  (PROAMATINE ) 5 MG tablet Take 1 tablet (5 mg total) by mouth 3 (three) times daily with meals.     montelukast  (SINGULAIR ) 10 MG tablet TAKE 1 TABLET BY MOUTH ONCE DAILY AT BEDTIME 30 tablet 5   MOUNJARO  5 MG/0.5ML Pen INJECT 1/2 (ONE-HALF) ML SUBCUTANEOUSLY  ONCE A WEEK 4 mL 0   oxyCODONE  (ROXICODONE ) 5 MG immediate release tablet Take 1 tablet (5 mg total) by mouth every 6 (six) hours as needed for severe pain (pain score 7-10) (Chronic left leg pain). 12 tablet 0   pantoprazole  (PROTONIX ) 40 MG tablet Take 1 tablet (40 mg total) by mouth 2 (two) times daily before a meal. (Patient taking differently: Take 40 mg by mouth daily.) 180 tablet 1   polyethylene glycol  (MIRALAX  / GLYCOLAX ) 17 g packet Take 17 g by mouth 2 (two) times daily. 14 each 0   rosuvastatin  (CRESTOR ) 10 MG tablet Take 1 tablet (10 mg total) by mouth daily.     sodium chloride  (OCEAN) 0.65 % SOLN nasal spray Place 1 spray into both nostrils as needed for congestion. 480 mL 1   dapagliflozin  propanediol (FARXIGA ) 10 MG TABS tablet Take 1 tablet (10 mg total) by mouth daily. (Patient not taking: Reported on 01/29/2024)     tiotropium (SPIRIVA  HANDIHALER) 18 MCG inhalation capsule Place 1 capsule (18 mcg total) into inhaler and inhale daily. 30 capsule 2   No current facility-administered medications on file prior to visit.     Review of Systems  Constitutional:  Positive for fatigue.  Respiratory:  Positive for cough.  Objective:   Vitals:   01/29/24 1410  BP: (!) 140/86  Pulse: (!) 105  Temp: 98.4 F (36.9 C)  SpO2: 93%   BP Readings from Last 3 Encounters:  01/29/24 (!) 140/86  01/27/24 (!) 125/107  01/23/24 (!) 115/94   Wt Readings from Last 3 Encounters:  01/23/24 190 lb (86.2 kg)  01/22/24 190 lb 8 oz (86.4 kg)  01/14/24 194 lb (88 kg)   Body mass index is 31.62 kg/m.    Physical Exam     Lab Results  Component Value Date   WBC 10.3 01/27/2024   HGB 10.7 (L) 01/27/2024   HCT 34.2 (L) 01/27/2024   PLT 394 01/27/2024   GLUCOSE 103 (H) 01/27/2024   CHOL 119 06/05/2023   TRIG 101.0 06/05/2023   HDL 41.90 06/05/2023   LDLDIRECT 79.0 08/01/2020   LDLCALC 57 06/05/2023   ALT 11 12/20/2023   AST 22 12/20/2023   NA 136 01/27/2024   K 3.6 01/27/2024   CL 99 01/27/2024   CREATININE 1.31 (H) 01/27/2024   BUN 11 01/27/2024   CO2 27 01/27/2024   TSH 7.062 (H) 12/21/2023   INR 1.0 12/20/2023   HGBA1C 6.4 (H) 10/07/2023   MICROALBUR 0.9 06/05/2023     Assessment & Plan:    See Problem List for Assessment and Plan of chronic medical problems.

## 2024-01-29 ENCOUNTER — Other Ambulatory Visit: Payer: Self-pay

## 2024-01-29 ENCOUNTER — Ambulatory Visit: Admitting: Internal Medicine

## 2024-01-29 VITALS — BP 132/80 | HR 105 | Temp 98.4°F | Ht 65.0 in

## 2024-01-29 DIAGNOSIS — N184 Chronic kidney disease, stage 4 (severe): Secondary | ICD-10-CM

## 2024-01-29 DIAGNOSIS — Z7984 Long term (current) use of oral hypoglycemic drugs: Secondary | ICD-10-CM

## 2024-01-29 DIAGNOSIS — S42114A Nondisplaced fracture of body of scapula, right shoulder, initial encounter for closed fracture: Secondary | ICD-10-CM | POA: Diagnosis not present

## 2024-01-29 DIAGNOSIS — E1122 Type 2 diabetes mellitus with diabetic chronic kidney disease: Secondary | ICD-10-CM

## 2024-01-29 DIAGNOSIS — I951 Orthostatic hypotension: Secondary | ICD-10-CM | POA: Diagnosis not present

## 2024-01-29 DIAGNOSIS — J432 Centrilobular emphysema: Secondary | ICD-10-CM

## 2024-01-29 DIAGNOSIS — M62838 Other muscle spasm: Secondary | ICD-10-CM

## 2024-01-29 DIAGNOSIS — F419 Anxiety disorder, unspecified: Secondary | ICD-10-CM

## 2024-01-29 MED ORDER — ONETOUCH ULTRASOFT LANCETS MISC: 12 refills | Status: DC

## 2024-01-29 MED ORDER — TIZANIDINE HCL 2 MG PO TABS: 2.0000 mg | ORAL_TABLET | Freq: Four times a day (QID) | ORAL | 2 refills | Status: DC | PRN

## 2024-01-29 MED ORDER — ONETOUCH VERIO FLEX SYSTEM W/DEVICE KIT: 1.0000 | PACK | 0 refills | Status: DC

## 2024-01-29 MED ORDER — OXYCODONE HCL 5 MG PO TABS: 5.0000 mg | ORAL_TABLET | Freq: Four times a day (QID) | ORAL | 0 refills | Status: DC | PRN

## 2024-01-29 MED ORDER — GLUCOSE BLOOD VI STRP: ORAL_STRIP | 12 refills | Status: DC

## 2024-01-29 NOTE — Assessment & Plan Note (Signed)
 Chronic Moderate centrilobular emphysema Chronic respiratory failure with hypoxia-on oxygen continuously Spiriva  discontinued because it was causing tongue swelling Continue Symbicort  2 puffs twice daily-could consider trying a different inhaler Nebulizer treatments as needed

## 2024-01-29 NOTE — Patient Instructions (Addendum)
      Medications changes include :   continue to hold farxiga ,  try tizanidine  (muscle relaxer),  continue oxycodone  for pain.

## 2024-01-29 NOTE — Assessment & Plan Note (Signed)
 New Related to recent fall Sees orthopedics-Dr. Beverley in 2 days Having significant pain Continue oxycodone  5 mg every 6 hours as needed-this is causing some hallucinations, but unfortunately we have to use a narcotic because her pain is not controlled, but I do not want to increase this in any way because of the hallucinations Start tizanidine  2 mg every 8 hours as needed Family will manage medications and make sure she is not to drowsy

## 2024-01-29 NOTE — Assessment & Plan Note (Signed)
 Chronic Currently BP okay On midodrine  5 mg 3 times daily-continue

## 2024-01-29 NOTE — Assessment & Plan Note (Signed)
 Chronic  Lab Results  Component Value Date   HGBA1C 6.4 (H) 10/07/2023   Sugars controlled Continue mounjaro  5 mg weekly Stopped farxiga  due to bladder symptoms - dysuria I will continue to prescribe diabetic supplies  -  glucometer and supplies including lancets, lancet device, glucose strips

## 2024-01-29 NOTE — Assessment & Plan Note (Signed)
 Subacute Having muscle spasms in her bilateral legs and back Severe in nature Restart tizanidine  2 mg every 8 hours as needed-May need to adjust if not effective

## 2024-01-29 NOTE — Assessment & Plan Note (Signed)
 Chronic Increased anxiety related to situation She is scared that she is going to fall and she is scared that she has not been awake when she goes to sleep Continue Cymbalta  60 mg daily

## 2024-01-30 ENCOUNTER — Other Ambulatory Visit: Payer: Self-pay

## 2024-01-30 ENCOUNTER — Telehealth: Payer: Self-pay

## 2024-01-30 DIAGNOSIS — E1122 Type 2 diabetes mellitus with diabetic chronic kidney disease: Secondary | ICD-10-CM

## 2024-01-30 MED ORDER — GLUCOSE BLOOD VI STRP: ORAL_STRIP | 12 refills | Status: AC

## 2024-01-30 MED ORDER — ONETOUCH ULTRASOFT LANCETS MISC: 12 refills | Status: AC

## 2024-01-30 MED ORDER — ONETOUCH VERIO FLEX SYSTEM W/DEVICE KIT: 1.0000 | PACK | 0 refills | Status: AC

## 2024-01-30 NOTE — Telephone Encounter (Signed)
 Script refaxed again.  Dx was attached yesterday but this time faxed and placed directly in comments to pharmacy.

## 2024-01-30 NOTE — Telephone Encounter (Signed)
 Copied from CRM [id number]. Topic: Clinical - Medication Question >> Jan 30, 2024 11:02 AM Revonda D wrote: Reason for CRM: Oge with [facility] pharmacy is calling to request a prescription for test strips and lancets one touch. Oge stated that she also needs the diagnostics codes on the prescription.

## 2024-01-31 ENCOUNTER — Ambulatory Visit (INDEPENDENT_AMBULATORY_CARE_PROVIDER_SITE_OTHER): Admitting: Student

## 2024-01-31 DIAGNOSIS — S42114A Nondisplaced fracture of body of scapula, right shoulder, initial encounter for closed fracture: Secondary | ICD-10-CM

## 2024-01-31 DIAGNOSIS — Z13820 Encounter for screening for osteoporosis: Secondary | ICD-10-CM | POA: Diagnosis not present

## 2024-01-31 NOTE — Progress Notes (Addendum)
 Chief Complaint: Right shoulder injury    Discussed the use of AI scribe software for clinical note transcription with the patient, who gave verbal consent to proceed.  History of Present Illness Kerry Powell is a 78 year old female with a history of multiple falls and fractures who presents with shoulder pain and immobility. She was referred by her primary care physician, Dr. Geofm, for orthopedic evaluation.  She has had three falls since July, most recently on Thanksgiving at about 4-5 AM, when she fell out of bed and injured her shoulder. She has significant shoulder pain radiating down the arm, worsened by movement, with limited use and immobility.  A CT scan in the ED on 11/27 demonstrated presence of a scapular fracture.  She has been immobilized in a sling.  In July she fell and injured her left shin, which progressed to sepsis requiring hospitalization and an incision and drainage for a calf abscess. Soon after, she fell again, sustaining a left hip fracture requiring IM nailing with Dr. Celena and injuries to the left shoulder, back of the left foot, and left knee, which were managed nonoperatively.  She reports history of osteoporosis and recurrent fractures although unable to find history of DEXA scan. She describes a heavy sensation in her bones and muscle spasms, especially in the legs. She uses oxycodone  and tizanidine  every six hours for pain and muscle spasms. Pain disrupts sleep, and severe sleep loss has caused hallucinations. She has episodes of blacking out thought to be from drops in blood pressure and oxygen when standing, which contribute to her falls.   Surgical History:   Left hip IM nailing 10/24/2023  PMH/PSH/Family History/Social History/Meds/Allergies:    Past Medical History:  Diagnosis Date   Allergy    Anemia    Anxiety    Arthritis    BACK PAIN, CHRONIC 04/04/2007   Qualifier: Diagnosis of  By: [name], [name]     BIPOLAR AFFECTIVE DISORDER 04/04/2007   Qualifier: History of  By: [name], [name] E    CAD (coronary artery disease), native coronary artery    mild CAD with no obstructive disease by coronary CTA 06/2019   Cataract    removed both eyes   CHF (congestive heart failure) (HCC)    Chronic neck pain 04/30/2015   Cervical spondylosis [facility] Intermittent numbness and tingling in occipital region and headaches S/p 2 occipital nerve blocks - only temp relief no relief with gabapentin, ultram , brace MRI neck - facet arthrosis    CKD (chronic kidney disease) 04/26/2015   COPD (chronic obstructive pulmonary disease) (HCC)    pt denies - told this while was smoking -   Cough 04/26/2015   Depression 07/01/2017   Diabetes (HCC) 04/27/2015   Diagnosed 03/2015    Dizziness 06/10/2017   Dysphagia 04/26/2015   DYSPNEA 12/24/2007   Qualifier: Diagnosis of  By: [name]     Elevated TSH 08/09/2015   GASTRIC ULCER, ACUTE 04/07/2007   Annotation: EGD 04/07/07 Qualifier: Diagnosis of  By: [name], [name] E    GASTRITIS 04/07/2007   Annotation: EGD 04/07/07 Qualifier: Diagnosis of  By: [name], [name] E    GERD (gastroesophageal reflux disease)    H/O renal cell carcinoma 05/23/2017   Dx 2010, s/p cryoablation  Follows with urology - Dr [name]   HIATAL HERNIA 04/07/2007   Annotation: EGD 04/07/07 Qualifier: Diagnosis of  By: [name], NOLIA Pitts E    History of CVA (cerebrovascular accident) 04/26/2015   History of gout 04/26/2015   Hyperlipidemia    Hypertension    Hypoxia 03/06/2023   Irritable bowel syndrome 04/04/2007   Qualifier: Diagnosis of  By: [name], NOLIA Pitts BRAVO    Kidney tumor (benign)    Nausea and vomiting 09/09/2017   Occipital neuralgia 05/09/2016   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 Dr [name] in [place]   OSTEOARTHRITIS 04/04/2007   Qualifier: Diagnosis of  By: [name], NOLIA Pitts BRAVO    Personal history of colonic adenoma  06/07/2012   POSTNASAL DRIP SYNDROME 12/29/2007   Qualifier: Diagnosis of  By: [name], [name]     RESTLESS LEG SYNDROME 04/04/2007   Qualifier: Diagnosis of  By: [name], NOLIA Pitts BRAVO    RHINOSINUSITIS, 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   Qualifier: Diagnosis of  By: [name], NOLIA Pitts BRAVO    Sleep difficulties 09/09/2017   Snoring 04/26/2015   Stroke (HCC)    x2 2011   Thoracic aortic aneurysm 02/06/2018   Seen on MRI 01/25/18 of thoracic spine ordered by [facility] -- referred to CTS to monitor  -- 3.5 cm   TIA (transient ischemic attack)    TOBACCO ABUSE 12/17/2007   Qualifier: Diagnosis of  By: [name], [name]     Trigeminal neuralgia of right side of face 02/08/2016   URI (upper respiratory infection) 02/08/2016   Vitamin D  deficiency 03/12/2018   Past Surgical History:  Procedure Laterality Date   ABDOMINAL HYSTERECTOMY     APPENDECTOMY     APPLICATION OF WOUND VAC Left 10/09/2023   Procedure: APPLICATION, WOUND VAC;  Surgeon: [name], MD;  Location: [facility] OR;  Service: Orthopedics;  Laterality: Left;   CATARACT EXTRACTION     COLONOSCOPY     CYSTOCELE REPAIR     ESOPHAGOGASTRODUODENOSCOPY     FRACTURE SURGERY  2007   INCISION AND DRAINAGE OF DEEP ABSCESS, CALF Left 10/09/2023   Procedure: INCISION AND DRAINAGE OF DEEP ABSCESS, CALF;  Surgeon: [name], MD;  Location: [facility] OR;  Service: Orthopedics;  Laterality: Left;   INTRAMEDULLARY (IM) NAIL INTERTROCHANTERIC Left 10/24/2023   Procedure: FIXATION, FRACTURE, INTERTROCHANTERIC, WITH INTRAMEDULLARY ROD;  Surgeon: [name], MD;  Location: [facility] OR;  Service: Orthopedics;  Laterality: Left;   KIDNEY SURGERY     benign tumor removal   ORTHOPEDIC SURGERY     Wrist & elbow   POLYPECTOMY     RECTOCELE REPAIR     TONSILECTOMY, ADENOIDECTOMY, BILATERAL MYRINGOTOMY AND TUBES Bilateral 1968   UPPER GASTROINTESTINAL ENDOSCOPY     Social History   Socioeconomic History   Marital status: Married    Spouse name:  Not on file   Number of children: 2   Years of education: Not on file   Highest education level: 12th grade  Occupational History   Occupation: Retired  Tobacco Use   Smoking status: Former    Current packs/day: 0.00    Average packs/day: 1 pack/day for 46.0 years (46.0 ttl pk-yrs)    Types: Cigarettes    Start date: 07/01/1963    Quit date: 06/30/2009    Years since quitting: 14.5    Passive exposure: Past   Smokeless tobacco: Never  Vaping Use   Vaping status: Never Used  Substance and Sexual Activity  Alcohol  use: No   Drug use: No   Sexual activity: Not Currently  Other Topics Concern   Not on file  Social History Narrative   Married, retired   1 son 1 daughter   2 caffeine/day   2 story home, but doesn't have to use stairs - son stays upstairs   Social Drivers of Health   Financial Resource Strain: Low Risk  (12/08/2023)   Overall Financial Resource Strain (CARDIA)    Difficulty of Paying Living Expenses: Not very hard  Food Insecurity: No Food Insecurity (12/21/2023)   Hunger Vital Sign    Worried About Running Out of Food in the Last Year: Never true    Ran Out of Food in the Last Year: Never true  Transportation Needs: No Transportation Needs (12/21/2023)   PRAPARE - Administrator, Civil Service (Medical): No    Lack of Transportation (Non-Medical): No  Physical Activity: Inactive (12/08/2023)   Exercise Vital Sign    Days of Exercise per Week: 0 days    Minutes of Exercise per Session: Not on file  Stress: Stress Concern Present (12/08/2023)   [employer] of Occupational Health - Occupational Stress Questionnaire    Feeling of Stress: To some extent  Social Connections: Unknown (12/24/2023)   Social Connection and Isolation Panel    Frequency of Communication with Friends and Family: Not on file    Frequency of Social Gatherings with Friends and Family: Not on file    Attends Religious Services: Not on file    Active Member of Clubs or  Organizations: No    Attends Banker Meetings: Not on file    Marital Status: Not on file  Recent Concern: Social Connections - Socially Isolated (12/21/2023)   Social Connection and Isolation Panel    Frequency of Communication with Friends and Family: Once a week    Frequency of Social Gatherings with Friends and Family: Never    Attends Religious Services: Never    Diplomatic Services Operational Officer: Not on file    Attends Banker Meetings: Never    Marital Status: Married   Family History  Problem Relation Age of Onset   Congestive Heart Failure Mother    Cancer Mother        ovarian   Asthma Mother    Heart disease Mother    Heart disease Father    Diabetes Father    Kidney disease Father    Hyperlipidemia Father    Stroke Father    Diabetes Sister    Kidney disease Sister    Miscarriages / Stillbirths Sister    Arthritis Maternal Grandmother    Hyperlipidemia Maternal Grandmother    Hypertension Maternal Grandmother    Obesity Maternal Grandmother    Arthritis Paternal Grandfather    Hypertension Paternal Grandfather    Kidney disease Paternal Grandfather    Diabetes Paternal Aunt    Diabetes Paternal Uncle    Learning disabilities Brother    Anxiety disorder Son    Diabetes Son    Learning disabilities Son    Depression Maternal Aunt    Colon cancer Neg Hx    Colon polyps Neg Hx    Esophageal cancer Neg Hx    Rectal cancer Neg Hx    Stomach cancer Neg Hx    Allergies  Allergen Reactions   Gabapentin Other (See Comments)    Fluid retention    Spiriva  Respimat [Tiotropium Bromide ] Other (See Comments)  Tongue swelling   Clindamycin  Rash   Sulfa Antibiotics Nausea And Vomiting   Current Outpatient Medications  Medication Sig Dispense Refill   albuterol  (PROVENTIL ) (2.5 MG/3ML) 0.083% nebulizer solution USE 1 VIAL IN NEBULIZER EVERY 6 HOURS AND AS NEEDED FOR SHORT BREATH/WHEEZE.  Generic:  Ventolin  75 mL 2   allopurinol   (ZYLOPRIM ) 300 MG tablet Take 1 tablet by mouth once daily 90 tablet 0   Blood Glucose Monitoring Suppl (ONETOUCH VERIO FLEX SYSTEM) w/Device KIT 1 kit by Does not apply route as directed. 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 Patient request Accu-check meter and testing suppliles 1 kit 0   budesonide -formoterol  (SYMBICORT ) 160-4.5 MCG/ACT inhaler Inhale 2 puffs into the lungs 2 (two) times daily. 1 each 12   cholecalciferol (VITAMIN D3) 25 MCG (1000 UNIT) tablet Take 2,000 Units by mouth daily.     clopidogrel  (PLAVIX ) 75 MG tablet Take 1 tablet by mouth once daily 90 tablet 0   dapagliflozin  propanediol (FARXIGA ) 10 MG TABS tablet Take 1 tablet (10 mg total) by mouth daily. (Patient not taking: Reported on 01/29/2024)     diclofenac  Sodium (VOLTAREN ) 1 % GEL Apply 2 g topically 4 (four) times daily.     DULoxetine  (CYMBALTA ) 30 MG capsule Take 2 capsules (60 mg total) by mouth daily. 60 capsule 0   ferrous sulfate  325 (65 FE) MG tablet Take 325 mg by mouth every other day.     fluticasone  (FLONASE ) 50 MCG/ACT nasal spray Place 2 sprays into both nostrils daily. 16 g 6   Glucose Blood (BLOOD GLUCOSE TEST STRIPS) STRP May substitute to any manufacturer covered by patient's insurance. Patient to test glucose 2-3 times daily as needed 200 strip 11   glucose blood test strip Use as instructed 100 each 12   Lancet Device MISC May substitute to any manufacturer covered by patient's insurance.  Patient to test glucose 2-3 times daily as needed 1 each 11   Lancets (ONETOUCH ULTRASOFT) lancets Use as instructed 100 each 12   levocetirizine (XYZAL ) 5 MG tablet SMARTSIG:1 Tablet(s) By Mouth Every Evening     metoprolol  succinate (TOPROL -XL) 25 MG 24 hr tablet Take 1 tablet (25 mg total) by mouth daily.     midodrine  (PROAMATINE ) 5 MG  tablet Take 1 tablet (5 mg total) by mouth 3 (three) times daily with meals.     montelukast  (SINGULAIR ) 10 MG tablet TAKE 1 TABLET BY MOUTH ONCE DAILY AT BEDTIME 30 tablet 5   MOUNJARO  5 MG/0.5ML Pen INJECT 1/2 (ONE-HALF) ML SUBCUTANEOUSLY  ONCE A WEEK 4 mL 0   oxyCODONE  (ROXICODONE ) 5 MG immediate release tablet Take 1 tablet (5 mg total) by mouth every 6 (six) hours as needed for severe pain (pain score 7-10) (Chronic left leg pain). 90 tablet 0   pantoprazole  (PROTONIX ) 40 MG tablet Take 1 tablet (40 mg total) by mouth 2 (two) times daily before a meal. (Patient taking differently: Take 40 mg by mouth daily.) 180 tablet 1   polyethylene glycol (MIRALAX  / GLYCOLAX ) 17 g packet Take 17 g by mouth 2 (two) times daily. 14 each 0   rosuvastatin  (CRESTOR ) 10 MG tablet Take 1 tablet (10 mg total) by mouth daily.     sodium chloride  (OCEAN) 0.65 % SOLN nasal spray Place 1 spray into both nostrils as needed for  congestion. 480 mL 1   tiZANidine  (ZANAFLEX ) 2 MG tablet Take 1 tablet (2 mg total) by mouth every 6 (six) hours as needed for muscle spasms. 60 tablet 2   No current facility-administered medications for this visit.   No results found.  Review of Systems:   A ROS was performed including pertinent positives and negatives as documented in the HPI.  Physical Exam :   Constitutional: NAD and appears stated age Neurological: Alert and oriented Psych: Appropriate affect and cooperative There were no vitals taken for this visit.   Comprehensive Musculoskeletal Exam:    Patient has right upper extremity immobilized in a sling.  She does demonstrate scapular tenderness without any palpable or visible deformity.  Passive and active range of motion of the shoulder deferred on exam today.  Distal neurosensory exam is intact.  Radial, median, and ulnar nerves are intact.  Imaging:   Right shoulder x-ray and chest CT review from ED: Comminuted and overall nondisplaced fracture of the right scapular  body   I personally reviewed and interpreted the radiographs.      Assessment & Plan Nondisplaced fracture of left scapula   The fracture of her right scapula causes radiating arm pain. Immobilize the shoulder for 3 to 3.5 weeks to facilitate healing. Schedule a follow-up in 3 weeks for a repeat x-ray to assess progress. Adjust the sling for improved support and comfort.  Sequela of left femoral neck fracture   She experiences pain and mobility issues following surgery for a left femoral neck fracture in August 2025. Muscle spasms may be contributing to her symptoms. Continue the current pain management regimen with oxycodone  and tizanidine . Follow up with primary care for medication management and schedule a follow-up with Dr Genelle for further evaluation of hip issues.  Screening for osteoporosis Patient has experienced history of multiple fractures, most recently including a nondisplaced comminuted scapular fracture from a fall.  In August patient sustained a left hip intertrochanteric fracture requiring IM nailing with Dr. Celena.  Discussed concern for underlying osteoporosis and importance for fracture prevention.  Will plan to obtain a DEXA scan and refer to [facility] for osteoporosis workup.    I personally spent a total of 45 minutes in the care of the patient today including performing a medically appropriate exam/evaluation, counseling and educating, placing orders, documenting clinical information in the EHR, and independently interpreting results.    I personally saw and evaluated the patient, and participated in the management and treatment plan.  Leonce Reveal, PA-C Orthopedics

## 2024-02-03 ENCOUNTER — Ambulatory Visit: Admitting: Physician Assistant

## 2024-02-04 ENCOUNTER — Telehealth: Payer: Self-pay

## 2024-02-04 NOTE — Patient Instructions (Signed)
 Kerry Powell - I am sorry I was unable to reach you today for our scheduled appointment. I work with Geofm, Glade PARAS, MD and am calling to support your healthcare needs. Please contact me at [phone] at your earliest convenience. I look forward to speaking with you soon.   Thank you,   Heddy Shutter, RN, MSN, BSN, CCM [place]  [facility], Population Health Case Manager Phone: [phone]

## 2024-02-05 ENCOUNTER — Ambulatory Visit (HOSPITAL_BASED_OUTPATIENT_CLINIC_OR_DEPARTMENT_OTHER): Admitting: Student

## 2024-02-05 ENCOUNTER — Telehealth: Payer: Self-pay

## 2024-02-05 DIAGNOSIS — R413 Other amnesia: Secondary | ICD-10-CM

## 2024-02-05 NOTE — Telephone Encounter (Signed)
 Copied from CRM #[id number]. Topic: Referral - Request for Referral >> Feb 05, 2024 12:59 PM Ashley R wrote: Did the patient discuss referral with their provider in the last year? No - Discussed at hospital post-fall, but unsure if addressed. HH nurse suggesting to discuss during next appt. Difficulty remembering and saying words.    Appointment offered? Yes,   Type of order/referral and detailed reason for visit: Neurology  Preference of office, provider, location: Next available.  If referral order, have you been seen by this specialty before? Unsure (If Yes, this issue or another issue? When? Where?  Can we respond through MyChart? No, callback [phone]

## 2024-02-06 ENCOUNTER — Telehealth (HOSPITAL_BASED_OUTPATIENT_CLINIC_OR_DEPARTMENT_OTHER): Payer: Self-pay | Admitting: Student

## 2024-02-06 ENCOUNTER — Ambulatory Visit: Admitting: Physician Assistant

## 2024-02-06 NOTE — Telephone Encounter (Signed)
 Kerry Powell from [facility]  [id number] Home health Needs to clarify for her right shoulder

## 2024-02-06 NOTE — Telephone Encounter (Signed)
 Message left for patient today.

## 2024-02-06 NOTE — Telephone Encounter (Signed)
 Tobias - please clarify what the referral is for.

## 2024-02-07 NOTE — Telephone Encounter (Signed)
 Called Amy and gave advise as mentioned in Jackson's message

## 2024-02-08 NOTE — Addendum Note (Signed)
 Addended by: GEOFM GLADE PARAS on: 02/08/2024 12:46 PM   Modules accepted: Orders

## 2024-02-08 NOTE — Telephone Encounter (Signed)
 Referral ordered for memory concerns.

## 2024-02-09 ENCOUNTER — Encounter: Payer: Self-pay | Admitting: Internal Medicine

## 2024-02-09 ENCOUNTER — Other Ambulatory Visit: Payer: Self-pay | Admitting: Internal Medicine

## 2024-02-10 ENCOUNTER — Telehealth: Payer: Self-pay

## 2024-02-10 ENCOUNTER — Other Ambulatory Visit: Payer: Self-pay

## 2024-02-10 DIAGNOSIS — R413 Other amnesia: Secondary | ICD-10-CM

## 2024-02-10 MED ORDER — MIDODRINE HCL 5 MG PO TABS: 5.0000 mg | ORAL_TABLET | Freq: Three times a day (TID) | ORAL | 2 refills | Status: DC

## 2024-02-10 NOTE — Telephone Encounter (Signed)
 Copied from CRM [id number]. Topic: General - Other >> Feb 10, 2024 12:29 PM Thersia C wrote: Reason for CRM: Patient called in regarding needing a followup from her last appointment wanted to know if she needs to be scheduled  to see Dr.Burns would like a callback regarding this

## 2024-02-10 NOTE — Telephone Encounter (Signed)
 She should come back before-probably next month.  Also [facility] is not able to see her.  Since she saw [facility] neurology since seeing them they considered that to be her neurologist and they will not see her.  I did order a referral for [facility] neurology

## 2024-02-10 NOTE — Addendum Note (Signed)
 Addended by: GEOFM GLADE PARAS on: 02/10/2024 04:34 PM   Modules accepted: Orders

## 2024-02-11 ENCOUNTER — Telehealth: Payer: Self-pay

## 2024-02-11 NOTE — Telephone Encounter (Signed)
Ok referral cancelled

## 2024-02-11 NOTE — Addendum Note (Signed)
 Addended by: GEOFM GLADE PARAS on: 02/11/2024 08:57 AM   Modules accepted: Orders

## 2024-02-11 NOTE — Telephone Encounter (Signed)
 Noted.  Currently on medication to increase her blood pressure.

## 2024-02-11 NOTE — Telephone Encounter (Signed)
 Copied from CRM #[id number]. Topic: Clinical - Home Health Verbal Orders >> Feb 11, 2024  3:31 PM Pinkey ORN wrote: Caller/Agency: [facility] Callback Number: [phone] Service Requested: Skilled Nursing Frequency: weekly for 4 weeks and then every other week for 4 weeks  Any new concerns about the patient? No

## 2024-02-11 NOTE — Telephone Encounter (Signed)
 Copied from CRM #[id number]. Topic: General - Other >> Feb 11, 2024 11:14 AM Brittany M wrote: Reason for CRM: Amy- #[id number] [facility] - BP running Low- sitting 92/54 - after exercise is was at 102/60  Heartrate range range 80-115- Patient is alert and fine after visit .

## 2024-02-11 NOTE — Telephone Encounter (Signed)
 Okay for orders?

## 2024-02-12 NOTE — Telephone Encounter (Signed)
Verbals left today. 

## 2024-02-13 ENCOUNTER — Telehealth (HOSPITAL_BASED_OUTPATIENT_CLINIC_OR_DEPARTMENT_OTHER): Payer: Self-pay | Admitting: Student

## 2024-02-13 DIAGNOSIS — I5032 Chronic diastolic (congestive) heart failure: Secondary | ICD-10-CM | POA: Diagnosis not present

## 2024-02-13 DIAGNOSIS — S42111D Displaced fracture of body of scapula, right shoulder, subsequent encounter for fracture with routine healing: Secondary | ICD-10-CM | POA: Diagnosis not present

## 2024-02-13 DIAGNOSIS — D631 Anemia in chronic kidney disease: Secondary | ICD-10-CM | POA: Diagnosis not present

## 2024-02-13 DIAGNOSIS — I130 Hypertensive heart and chronic kidney disease with heart failure and stage 1 through stage 4 chronic kidney disease, or unspecified chronic kidney disease: Secondary | ICD-10-CM | POA: Diagnosis not present

## 2024-02-13 DIAGNOSIS — S72142D Displaced intertrochanteric fracture of left femur, subsequent encounter for closed fracture with routine healing: Secondary | ICD-10-CM | POA: Diagnosis not present

## 2024-02-13 DIAGNOSIS — N1832 Chronic kidney disease, stage 3b: Secondary | ICD-10-CM | POA: Diagnosis not present

## 2024-02-13 DIAGNOSIS — J432 Centrilobular emphysema: Secondary | ICD-10-CM | POA: Diagnosis not present

## 2024-02-13 DIAGNOSIS — I951 Orthostatic hypotension: Secondary | ICD-10-CM | POA: Diagnosis not present

## 2024-02-13 DIAGNOSIS — I2510 Atherosclerotic heart disease of native coronary artery without angina pectoris: Secondary | ICD-10-CM | POA: Diagnosis not present

## 2024-02-13 DIAGNOSIS — E1151 Type 2 diabetes mellitus with diabetic peripheral angiopathy without gangrene: Secondary | ICD-10-CM | POA: Diagnosis not present

## 2024-02-13 DIAGNOSIS — E1122 Type 2 diabetes mellitus with diabetic chronic kidney disease: Secondary | ICD-10-CM | POA: Diagnosis not present

## 2024-02-13 DIAGNOSIS — J9621 Acute and chronic respiratory failure with hypoxia: Secondary | ICD-10-CM | POA: Diagnosis not present

## 2024-02-13 NOTE — Telephone Encounter (Signed)
 Patient would like to some pain medication she states she is a lot of pain and the muscle relaxer are not helping her pain. Best contact [phone]

## 2024-02-14 ENCOUNTER — Other Ambulatory Visit (HOSPITAL_BASED_OUTPATIENT_CLINIC_OR_DEPARTMENT_OTHER): Payer: Self-pay | Admitting: Student

## 2024-02-14 ENCOUNTER — Telehealth (HOSPITAL_BASED_OUTPATIENT_CLINIC_OR_DEPARTMENT_OTHER): Payer: Self-pay | Admitting: Student

## 2024-02-14 MED ORDER — OXYCODONE HCL 5 MG PO TABS: 5.0000 mg | ORAL_TABLET | Freq: Four times a day (QID) | ORAL | 0 refills | Status: AC | PRN

## 2024-02-14 NOTE — Telephone Encounter (Signed)
 Okay for orders?

## 2024-02-14 NOTE — Telephone Encounter (Signed)
 Copied from CRM #[id number]. Topic: Clinical - Home Health Verbal Orders >> Feb 14, 2024  3:07 PM Sasha M wrote: Caller/Agency: Amy/[facility] Callback Number: [phone] Service Requested: Physical Therapy Frequency: Extend for once a week for 7 weeks Any new concerns about the patient? No

## 2024-02-14 NOTE — Telephone Encounter (Signed)
Verbals given to Amy today ?

## 2024-02-14 NOTE — Telephone Encounter (Signed)
 Patient would like to some pain medication she states she is a lot of pain and the muscle relaxer are not helping her pain. Best contact [phone]

## 2024-02-16 ENCOUNTER — Other Ambulatory Visit: Payer: Self-pay | Admitting: Internal Medicine

## 2024-02-16 DIAGNOSIS — E1169 Type 2 diabetes mellitus with other specified complication: Secondary | ICD-10-CM

## 2024-02-17 ENCOUNTER — Ambulatory Visit (INDEPENDENT_AMBULATORY_CARE_PROVIDER_SITE_OTHER): Admitting: Student

## 2024-02-17 ENCOUNTER — Ambulatory Visit (HOSPITAL_BASED_OUTPATIENT_CLINIC_OR_DEPARTMENT_OTHER)

## 2024-02-17 DIAGNOSIS — S42114D Nondisplaced fracture of body of scapula, right shoulder, subsequent encounter for fracture with routine healing: Secondary | ICD-10-CM

## 2024-02-17 DIAGNOSIS — S42114A Nondisplaced fracture of body of scapula, right shoulder, initial encounter for closed fracture: Secondary | ICD-10-CM

## 2024-02-17 NOTE — Progress Notes (Signed)
 "                                Chief Complaint: Right shoulder injury    History of Present Illness   02/17/24: Patient presents to clinic today for follow-up of a right scapular fracture that occurred now over 3 weeks ago.  She does report continued discomfort over the lateral and posterior shoulder that persist despite sling usage.  She has not been taking any pain medicine recently.  Pain as well as spasms in her lower legs have been affecting ability to sleep.     01/31/24: Kerry Powell is a 78 year old female with a history of multiple falls and fractures who presents with shoulder pain and immobility. She was referred by her primary care physician, Dr. Geofm, for orthopedic evaluation.  She has had three falls since July, most recently on Thanksgiving at about 4-5 AM, when she fell out of bed and injured her shoulder. She has significant shoulder pain radiating down the arm, worsened by movement, with limited use and immobility.  A CT scan in the ED on 11/27 demonstrated presence of a scapular fracture.  She has been immobilized in a sling.  In July she fell and injured her left shin, which progressed to sepsis requiring hospitalization and an incision and drainage for a calf abscess. Soon after, she fell again, sustaining a left hip fracture requiring IM nailing with Dr. Celena and injuries to the left shoulder, back of the left foot, and left knee, which were managed nonoperatively.  She reports history of osteoporosis and recurrent fractures although unable to find history of DEXA scan. She describes a heavy sensation in her bones and muscle spasms, especially in the legs. She uses oxycodone  and tizanidine  every six hours for pain and muscle spasms. Pain disrupts sleep, and severe sleep loss has caused hallucinations. She has episodes of blacking out thought to be from drops in blood pressure and oxygen when standing, which contribute to her falls.   Surgical History:   Left hip  IM nailing 10/24/2023  PMH/PSH/Family History/Social History/Meds/Allergies:    Past Medical History:  Diagnosis Date   Allergy    Anemia    Anxiety    Arthritis    BACK PAIN, CHRONIC 04/04/2007   Qualifier: Diagnosis of  By: [name], [name]    BIPOLAR AFFECTIVE DISORDER 04/04/2007   Qualifier: History of  By: [name], [name] E    CAD (coronary artery disease), native coronary artery    mild CAD with no obstructive disease by coronary CTA 06/2019   Cataract    removed both eyes   CHF (congestive heart failure) (HCC)    Chronic neck pain 04/30/2015   Cervical spondylosis [facility] Intermittent numbness and tingling in occipital region and headaches S/p 2 occipital nerve blocks - only temp relief no relief with gabapentin, ultram , brace MRI neck - facet arthrosis    CKD (chronic kidney disease) 04/26/2015   COPD (chronic obstructive pulmonary disease) (HCC)    pt denies - told this while was smoking -   Cough 04/26/2015   Depression 07/01/2017   Diabetes (HCC) 04/27/2015   Diagnosed 03/2015    Dizziness 06/10/2017   Dysphagia 04/26/2015   DYSPNEA 12/24/2007   Qualifier: Diagnosis of  By: [name]     Elevated TSH 08/09/2015   GASTRIC ULCER, ACUTE 04/07/2007   Annotation: EGD 04/07/07 Qualifier:  Diagnosis of  By: [name], NOLIA Pitts E    GASTRITIS 04/07/2007   Annotation: EGD 04/07/07 Qualifier: Diagnosis of  By: [name], NOLIA Pitts E    GERD (gastroesophageal reflux disease)    H/O renal cell carcinoma 05/23/2017   Dx 2010, s/p cryoablation Follows with urology - Dr [name]   HIATAL HERNIA 04/07/2007   Annotation: EGD 04/07/07 Qualifier: Diagnosis of  By: [name], NOLIA Pitts E    History of CVA (cerebrovascular accident) 04/26/2015   History of gout 04/26/2015   Hyperlipidemia    Hypertension    Hypoxia 03/06/2023   Irritable bowel syndrome 04/04/2007   Qualifier: Diagnosis of  By: [name], NOLIA Pitts BRAVO    Kidney tumor (benign)     Nausea and vomiting 09/09/2017   Occipital neuralgia 05/09/2016   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 Dr [name] in [place]   OSTEOARTHRITIS 04/04/2007   Qualifier: Diagnosis of  By: [name], NOLIA Pitts BRAVO    Personal history of colonic adenoma 06/07/2012   POSTNASAL DRIP SYNDROME 12/29/2007   Qualifier: Diagnosis of  By: [name], [name]     RESTLESS LEG SYNDROME 04/04/2007   Qualifier: Diagnosis of  By: [name], NOLIA Pitts BRAVO    RHINOSINUSITIS, 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   Qualifier: Diagnosis of  By: [name], NOLIA Pitts BRAVO    Sleep difficulties 09/09/2017   Snoring 04/26/2015   Stroke (HCC)    x2 2011   Thoracic aortic aneurysm 02/06/2018   Seen on MRI 01/25/18 of thoracic spine ordered by [facility] -- referred to CTS to monitor  -- 3.5 cm   TIA (transient ischemic attack)    TOBACCO ABUSE 12/17/2007   Qualifier: Diagnosis of  By: [name], [name]     Trigeminal neuralgia of right side of face 02/08/2016   URI (upper respiratory infection) 02/08/2016   Vitamin D  deficiency 03/12/2018   Past Surgical History:  Procedure Laterality Date   ABDOMINAL HYSTERECTOMY     APPENDECTOMY     APPLICATION OF WOUND VAC Left 10/09/2023   Procedure: APPLICATION, WOUND VAC;  Surgeon: [name], MD;  Location: [facility] OR;  Service: Orthopedics;  Laterality: Left;   CATARACT EXTRACTION     COLONOSCOPY     CYSTOCELE REPAIR     ESOPHAGOGASTRODUODENOSCOPY     FRACTURE SURGERY  2007   INCISION AND DRAINAGE OF DEEP ABSCESS, CALF Left 10/09/2023   Procedure: INCISION AND DRAINAGE OF DEEP ABSCESS, CALF;  Surgeon: [name], MD;  Location: [facility] OR;  Service: Orthopedics;  Laterality: Left;   INTRAMEDULLARY (IM) NAIL INTERTROCHANTERIC Left 10/24/2023   Procedure: FIXATION, FRACTURE, INTERTROCHANTERIC, WITH INTRAMEDULLARY ROD;  Surgeon: [name], MD;  Location: [facility] OR;  Service: Orthopedics;  Laterality: Left;   KIDNEY SURGERY     benign tumor removal   ORTHOPEDIC SURGERY      Wrist & elbow   POLYPECTOMY     RECTOCELE REPAIR     TONSILECTOMY, ADENOIDECTOMY, BILATERAL MYRINGOTOMY AND TUBES Bilateral 1968   UPPER GASTROINTESTINAL ENDOSCOPY     Social History   Socioeconomic History   Marital status: Married    Spouse name: Not on file   Number of children: 2   Years of education: Not on file   Highest education level: 12th grade  Occupational History   Occupation: Retired  Tobacco Use   Smoking status: Former    Current packs/day: 0.00    Average packs/day: 1 pack/day for 46.0 years (46.0 ttl pk-yrs)  Types: Cigarettes    Start date: 07/01/1963    Quit date: 06/30/2009    Years since quitting: 14.6    Passive exposure: Past   Smokeless tobacco: Never  Vaping Use   Vaping status: Never Used  Substance and Sexual Activity   Alcohol  use: No   Drug use: No   Sexual activity: Not Currently  Other Topics Concern   Not on file  Social History Narrative   Married, retired   1 son 1 daughter   2 caffeine/day   2 story home, but doesn't have to use stairs - son stays upstairs   Social Drivers of Health   Tobacco Use: Medium Risk (01/28/2024)   Patient History    Smoking Tobacco Use: Former    Smokeless Tobacco Use: Never    Passive Exposure: Past  Physicist, Medical Strain: Low Risk (12/08/2023)   Overall Financial Resource Strain (CARDIA)    Difficulty of Paying Living Expenses: Not very hard  Food Insecurity: No Food Insecurity (12/21/2023)   Epic    Worried About Programme Researcher, Broadcasting/film/video in the Last Year: Never true    Ran Out of Food in the Last Year: Never true  Transportation Needs: No Transportation Needs (12/21/2023)   Epic    Lack of Transportation (Medical): No    Lack of Transportation (Non-Medical): No  Physical Activity: Inactive (12/08/2023)   Exercise Vital Sign    Days of Exercise per Week: 0 days    Minutes of Exercise per Session: Not on file  Stress: Stress Concern Present (12/08/2023)   [employer] of Occupational Health  - Occupational Stress Questionnaire    Feeling of Stress: To some extent  Social Connections: Unknown (12/24/2023)   Social Connection and Isolation Panel    Frequency of Communication with Friends and Family: Not on file    Frequency of Social Gatherings with Friends and Family: Not on file    Attends Religious Services: Not on file    Active Member of Clubs or Organizations: No    Attends Banker Meetings: Not on file    Marital Status: Not on file  Recent Concern: Social Connections - Socially Isolated (12/21/2023)   Social Connection and Isolation Panel    Frequency of Communication with Friends and Family: Once a week    Frequency of Social Gatherings with Friends and Family: Never    Attends Religious Services: Never    Database Administrator or Organizations: Not on file    Attends Banker Meetings: Never    Marital Status: Married  Depression (PHQ2-9): High Risk (01/29/2024)   Depression (PHQ2-9)    PHQ-2 Score: 24  Alcohol  Screen: Low Risk (05/15/2023)   Alcohol  Screen    Last Alcohol  Screening Score (AUDIT): 0  Housing: Low Risk (12/21/2023)   Epic    Unable to Pay for Housing in the Last Year: No    Number of Times Moved in the Last Year: 0    Homeless in the Last Year: No  Utilities: Not At Risk (12/21/2023)   Epic    Threatened with loss of utilities: No  Health Literacy: Adequate Health Literacy (05/15/2023)   B1300 Health Literacy    Frequency of need for help with medical instructions: Never   Family History  Problem Relation Age of Onset   Congestive Heart Failure Mother    Cancer Mother        ovarian   Asthma Mother    Heart disease Mother  Heart disease Father    Diabetes Father    Kidney disease Father    Hyperlipidemia Father    Stroke Father    Diabetes Sister    Kidney disease Sister    Miscarriages / Stillbirths Sister    Arthritis Maternal Grandmother    Hyperlipidemia Maternal Grandmother    Hypertension Maternal  Grandmother    Obesity Maternal Grandmother    Arthritis Paternal Grandfather    Hypertension Paternal Grandfather    Kidney disease Paternal Grandfather    Diabetes Paternal Aunt    Diabetes Paternal Uncle    Learning disabilities Brother    Anxiety disorder Son    Diabetes Son    Learning disabilities Son    Depression Maternal Aunt    Colon cancer Neg Hx    Colon polyps Neg Hx    Esophageal cancer Neg Hx    Rectal cancer Neg Hx    Stomach cancer Neg Hx    Allergies  Allergen Reactions   Gabapentin Other (See Comments)    Fluid retention    Spiriva  Respimat [Tiotropium Bromide ] Other (See Comments)    Tongue swelling   Clindamycin  Rash   Sulfa Antibiotics Nausea And Vomiting   Current Outpatient Medications  Medication Sig Dispense Refill   albuterol  (PROVENTIL ) (2.5 MG/3ML) 0.083% nebulizer solution USE 1 VIAL IN NEBULIZER EVERY 6 HOURS AND AS NEEDED FOR SHORT BREATH/WHEEZE.  Generic:  Ventolin  75 mL 2   allopurinol  (ZYLOPRIM ) 300 MG tablet Take 1 tablet by mouth once daily 90 tablet 0   Blood Glucose Monitoring Suppl (ONETOUCH VERIO FLEX SYSTEM) w/Device KIT 1 kit by Does not apply route as directed. 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 Patient request Accu-check meter and testing suppliles 1 kit 0   budesonide -formoterol  (SYMBICORT ) 160-4.5 MCG/ACT inhaler Inhale 2 puffs into the lungs 2 (two) times daily. 1 each 12   cholecalciferol (VITAMIN D3) 25 MCG (1000 UNIT) tablet Take 2,000 Units by mouth daily.     clopidogrel  (PLAVIX ) 75 MG tablet Take 1 tablet by mouth once daily 90 tablet 0   dapagliflozin  propanediol (FARXIGA ) 10 MG TABS tablet Take 1 tablet (10 mg total) by mouth daily. (Patient not taking: Reported on 01/29/2024)     diclofenac  Sodium (VOLTAREN ) 1 % GEL Apply 2 g topically 4 (four)  times daily.     DULoxetine  (CYMBALTA ) 30 MG capsule Take 2 capsules by mouth once daily 60 capsule 0   ferrous sulfate  325 (65 FE) MG tablet Take 325 mg by mouth every other day.     fluticasone  (FLONASE ) 50 MCG/ACT nasal spray Place 2 sprays into both nostrils daily. 16 g 6   Glucose Blood (BLOOD GLUCOSE TEST STRIPS) STRP May substitute to any manufacturer covered by patient's insurance. Patient to test glucose 2-3 times daily as needed 200 strip 11   glucose blood test strip Use as instructed 100 each 12   Lancet Device MISC May substitute to any manufacturer covered by patient's insurance.  Patient to test glucose 2-3 times daily as needed 1 each 11   Lancets (ONETOUCH ULTRASOFT) lancets Use as instructed 100 each 12   levocetirizine (XYZAL ) 5 MG tablet SMARTSIG:1 Tablet(s) By Mouth Every Evening     metoprolol  succinate (TOPROL -XL) 25 MG 24 hr tablet Take 1 tablet (25 mg total) by mouth daily.  midodrine  (PROAMATINE ) 5 MG tablet Take 1 tablet (5 mg total) by mouth 3 (three) times daily with meals. 90 tablet 2   montelukast  (SINGULAIR ) 10 MG tablet TAKE 1 TABLET BY MOUTH ONCE DAILY AT BEDTIME 30 tablet 5   MOUNJARO  5 MG/0.5ML Pen INJECT 1/2 (ONE-HALF) ML SUBCUTANEOUSLY  ONCE A WEEK 4 mL 0   oxyCODONE  (ROXICODONE ) 5 MG immediate release tablet Take 1 tablet (5 mg total) by mouth every 6 (six) hours as needed for up to 5 days for severe pain (pain score 7-10) (Chronic left leg pain). 20 tablet 0   pantoprazole  (PROTONIX ) 40 MG tablet TAKE 1 TABLET BY MOUTH TWICE DAILY BEFORE A MEAL 180 tablet 0   polyethylene glycol (MIRALAX  / GLYCOLAX ) 17 g packet Take 17 g by mouth 2 (two) times daily. 14 each 0   rosuvastatin  (CRESTOR ) 10 MG tablet Take 1 tablet (10 mg total) by mouth daily.     sodium chloride  (OCEAN) 0.65 % SOLN nasal spray Place 1 spray into both nostrils as needed for congestion. 480 mL 1   tiZANidine  (ZANAFLEX ) 2 MG tablet Take 1 tablet (2 mg total) by mouth every 6 (six) hours as  needed for muscle spasms. 60 tablet 2   No current facility-administered medications for this visit.   No results found.  Review of Systems:   A ROS was performed including pertinent positives and negatives as documented in the HPI.  Physical Exam :   Constitutional: NAD and appears stated age Neurological: Alert and oriented Psych: Appropriate affect and cooperative There were no vitals taken for this visit.   Comprehensive Musculoskeletal Exam:    Patient's right arm is immobilized in a sling.  There is tenderness palpation over the lateral deltoid and scapula.  Mild tenderness in the elbow with slight decrease in elbow extension.  Distal motor and neurosensory exams are intact.  Imaging:   Xray (right shoulder 2 views): Nondisplaced right scapula fracture with evidence of early peripheral callus formation   I personally reviewed and interpreted the radiographs.      Assessment & Plan Nondisplaced fracture of left scapula   Patient is approximately 3 weeks status post nondisplaced fracture of the right scapular body.  This does show progression of healing on today's x-rays.  Recommend today that she begin weaning out of the sling as tolerated and will have patient and Occupational Therapy continue to work on passive and active assisted range of motion to prevent stiffness.  In another 2 weeks, patient can then begin working on progressing active range of motion as tolerated.  A refill was sent a few days ago of oxycodone  and patient will pick this up later today and use as needed for pain control.  She does have a scheduled follow-up on 1/14 with Dr. Genelle mainly to discuss her hip, however could also assess progress with the shoulder at that time.  Otherwise I will be happy to see her back in approximately 4 weeks for recheck.    I personally saw and evaluated the patient, and participated in the management and treatment plan.  Leonce Reveal, PA-C Orthopedics  "

## 2024-02-28 ENCOUNTER — Ambulatory Visit: Admitting: Physician Assistant

## 2024-03-02 ENCOUNTER — Telehealth (HOSPITAL_BASED_OUTPATIENT_CLINIC_OR_DEPARTMENT_OTHER): Payer: Self-pay | Admitting: Student

## 2024-03-02 ENCOUNTER — Other Ambulatory Visit: Payer: Self-pay

## 2024-03-02 NOTE — Patient Instructions (Signed)
 Visit Information  Thank you for taking time to visit with me today. Please don't hesitate to contact me if I can be of assistance to you before our next scheduled appointment.  Your next care management appointment is by telephone on 04/02/24 at 1:30 pm  Please call the care guide team at [phone] if you need to cancel, schedule, or reschedule an appointment.   Please call the Suicide and Crisis Lifeline: 988 call the [place]  National Suicide Prevention Lifeline: 307 515 6278 or TTY: 337-724-5649 TTY 734-640-4816) to talk to a trained counselor if you are experiencing a Mental Health or Behavioral Health Crisis or need someone to talk to.  Heddy Shutter, RN, MSN, BSN, CCM [place]  [facility], Population Health Case Manager Phone: [phone]   Understanding Your Risk for Falls Millions of people have serious injuries from falls each year. It is important to understand your risk of falling. Talk with your health care provider about your risk and what you can do to lower it. If you do have a serious fall, make sure to tell your provider. Falling once raises your risk of falling again. How can falls affect me? Serious injuries from falls are common. These include: Broken bones, such as hip fractures. Head injuries, such as traumatic brain injuries (TBI) or concussions. A fear of falling can cause you to avoid activities and stay at home. This can make your muscles weaker and raise your risk for a fall. What can increase my risk? There are a number of risk factors that increase your risk for falling. The more risk factors you have, the higher your risk of falling. Serious injuries from a fall happen most often to people who are older than 79 years old. Teenagers and young adults ages 63-29 are also at higher risk. Common risk factors include: Weakness in the lower body. Being generally weak or confused due to long-term (chronic) illness. Dizziness or balance  problems. Poor vision. Medicines that cause dizziness or drowsiness. These may include: Medicines for your blood pressure, heart, anxiety, insomnia, or swelling (edema). Pain medicines. Muscle relaxants. Other risk factors include: Drinking alcohol . Having had a fall in the past. Having foot pain or wearing improper footwear. Working at a dangerous job. Having any of the following in your home: Tripping hazards, such as floor clutter or loose rugs. Poor lighting. Pets. Having dementia or memory loss. What actions can I take to lower my risk of falling?     Physical activity Stay physically fit. Do strength and balance exercises. Consider taking a regular class to build strength and balance. Yoga and tai chi are good options. Vision Have your eyes checked every year and your prescription for glasses or contacts updated as needed. Shoes and walking aids Wear non-skid shoes. Wear shoes that have rubber soles and low heels. Do not wear high heels. Do not walk around the house in socks or slippers. Use a cane or walker as told by your provider. Home safety Attach secure railings on both sides of your stairs. Install grab bars for your bathtub, shower, and toilet. Use a non-skid mat in your bathtub or shower. Attach bath mats securely with double-sided, non-slip rug tape. Use good lighting in all rooms. Keep a flashlight near your bed. Make sure there is a clear path from your bed to the bathroom. Use night-lights. Do not use throw rugs. Make sure all carpeting is taped or tacked down securely. Remove all clutter from walkways and stairways, including extension cords. Repair uneven  or broken steps and floors. Avoid walking on icy or slippery surfaces. Walk on the grass instead of on icy or slick sidewalks. Use ice melter to get rid of ice on walkways in the winter. Use a cordless phone. Questions to ask your health care provider Can you help me check my risk for a fall? Do any of  my medicines make me more likely to fall? Should I take a vitamin D  supplement? What exercises can I do to improve my strength and balance? Should I make an appointment to have my vision checked? Do I need a bone density test to check for weak bones (osteoporosis)? Would it help to use a cane or a walker? Where to find more information Centers for Disease Control and Prevention, STEADI: tonerpromos.no Community-Based Fall Prevention Programs: tonerpromos.no [employer] on Aging: baseringtones.pl Contact a health care provider if: You fall at home. You are afraid of falling at home. You feel weak, drowsy, or dizzy. This information is not intended to replace advice given to you by your health care provider. Make sure you discuss any questions you have with your health care provider. Document Revised: 10/16/2021 Document Reviewed: 10/16/2021 Elsevier Patient Education  2024 [employer].

## 2024-03-02 NOTE — Patient Outreach (Signed)
 Complex Care Management   Visit Note  03/02/2024  Name:  NAJMO PARDUE MRN: [id number] DOB: 1945/10/03  Situation: Referral received for Complex Care Management related to  transition from SNF post Left Hip Fracture repair I obtained verbal consent from Patient.  Visit completed with Patient  on the phone  Background:   Past Medical History:  Diagnosis Date   Allergy    Anemia    Anxiety    Arthritis    BACK PAIN, CHRONIC 04/04/2007   Qualifier: Diagnosis of  By: [name], [name]    BIPOLAR AFFECTIVE DISORDER 04/04/2007   Qualifier: History of  By: [name], [name] E    CAD (coronary artery disease), native coronary artery    mild CAD with no obstructive disease by coronary CTA 06/2019   Cataract    removed both eyes   CHF (congestive heart failure) (HCC)    Chronic neck pain 04/30/2015   Cervical spondylosis [facility] Intermittent numbness and tingling in occipital region and headaches S/p 2 occipital nerve blocks - only temp relief no relief with gabapentin, ultram , brace MRI neck - facet arthrosis    CKD (chronic kidney disease) 04/26/2015   COPD (chronic obstructive pulmonary disease) (HCC)    pt denies - told this while was smoking -   Cough 04/26/2015   Depression 07/01/2017   Diabetes (HCC) 04/27/2015   Diagnosed 03/2015    Dizziness 06/10/2017   Dysphagia 04/26/2015   DYSPNEA 12/24/2007   Qualifier: Diagnosis of  By: [name]     Elevated TSH 08/09/2015   GASTRIC ULCER, ACUTE 04/07/2007   Annotation: EGD 04/07/07 Qualifier: Diagnosis of  By: [name], [name] E    GASTRITIS 04/07/2007   Annotation: EGD 04/07/07 Qualifier: Diagnosis of  By: [name], [name] E    GERD (gastroesophageal reflux disease)    H/O renal cell carcinoma 05/23/2017   Dx 2010, s/p cryoablation Follows with urology - Dr [name]   HIATAL HERNIA 04/07/2007   Annotation: EGD 04/07/07 Qualifier: Diagnosis of  By: [name], [name] E    History of CVA  (cerebrovascular accident) 04/26/2015   History of gout 04/26/2015   Hyperlipidemia    Hypertension    Hypoxia 03/06/2023   Irritable bowel syndrome 04/04/2007   Qualifier: Diagnosis of  By: [name], [name]    Kidney tumor (benign)    Nausea and vomiting 09/09/2017   Occipital neuralgia 05/09/2016   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 Dr [name] in [place]   OSTEOARTHRITIS 04/04/2007   Qualifier: Diagnosis of  By: [name], [name]    Personal history of colonic adenoma 06/07/2012   POSTNASAL DRIP SYNDROME 12/29/2007   Qualifier: Diagnosis of  By: [name], [name]     RESTLESS LEG SYNDROME 04/04/2007   Qualifier: Diagnosis of  By: [name], [name]    RHINOSINUSITIS, 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   Qualifier: Diagnosis of  By: [name], [name]    Sleep difficulties 09/09/2017   Snoring 04/26/2015   Stroke (HCC)    x2 2011   Thoracic aortic aneurysm 02/06/2018   Seen on MRI 01/25/18 of thoracic spine ordered by [facility] -- referred to CTS to monitor  -- 3.5 cm   TIA (transient ischemic attack)    TOBACCO ABUSE 12/17/2007   Qualifier: Diagnosis of  By: [name], [name]     Trigeminal neuralgia of right side of face 02/08/2016   URI (upper  respiratory infection) 02/08/2016   Vitamin D  deficiency 03/12/2018    Assessment: Patient Reported Symptoms:  Cognitive Cognitive Status: No symptoms reported      Neurological Neurological Review of Symptoms: No symptoms reported    HEENT HEENT Symptoms Reported: No symptoms reported      Cardiovascular Cardiovascular Symptoms Reported: No symptoms reported Does patient have uncontrolled Hypertension?: No (patient continues to monitor BP for low blood pressure and is taking midodrine  as prescribed to maintain adequate BP.) Cardiovascular Management Strategies: Medication therapy, Routine screening Weight: 159 lb (72.1 kg) Cardiovascular Comment: patient reports most recent weight 159lb. she confirmed  she has had around a 30lb weight loss over the last 3 months.  Respiratory Respiratory Symptoms Reported: No symptoms reported Other Respiratory Symptoms: using O2 at 2l/[place] as needed    Endocrine Endocrine Symptoms Reported: No symptoms reported Is patient diabetic?: Yes Is patient checking blood sugars at home?: Yes List most recent blood sugar readings, include date and time of day: patient reports BS last checked 03/01/24 was 108 fasting. ranging 90-160. Endocrine Self-Management Outcome: 4 (good)  Gastrointestinal Gastrointestinal Symptoms Reported: No symptoms reported      Genitourinary Genitourinary Symptoms Reported: No symptoms reported    Integumentary Integumentary Symptoms Reported: No symptoms reported Additional Integumentary Details: patient reports shin on left leg bruised and went into sepsis is healed, but sensative;  left hip incision is healed to hip. right shoulder fracture healing. Skin Management Strategies: Fluid modification, Routine screening, Medication therapy  Musculoskeletal Musculoskelatal Symptoms Reviewed: Limited mobility, Difficulty walking Additional Musculoskeletal Details: patient is post left hip repair. patient reports her left hip is 2shorter than right leg thereby making gait unsteady which has facilitated history of falls. She reports the right shoulder fracture healing and continues to have physical therapy and nursing with [facility]. She reports right shoulder pain is 2/10  and the Left hip pain is an 8/10. She states she is managing pain with heating pad, ice pack, medication. Musculoskeletal Management Strategies: Medication therapy, Routine screening Musculoskeletal Self-Management Outcome: 3 (uncertain)      Psychosocial Additional Psychological Details: expresses frustration in progression of healing post surgery. Behavioral Management Strategies: Adequate rest, Coping strategies (playing memory games on phone, continues with following up with  emails)        03/02/2024    PHQ2-9 Depression Screening   Little interest or pleasure in doing things Not at all  Feeling down, depressed, or hopeless Not at all  PHQ-2 - Total Score 0  Trouble falling or staying asleep, or sleeping too much    Feeling tired or having little energy    Poor appetite or overeating     Feeling bad about yourself - or that you are a failure or have let yourself or your family down    Trouble concentrating on things, such as reading the newspaper or watching television    Moving or speaking so slowly that other people could have noticed.  Or the opposite - being so fidgety or restless that you have been moving around a lot more than usual    Thoughts that you would be better off dead, or hurting yourself in some way    PHQ2-9 Total Score    If you checked off any problems, how difficult have these problems made it for you to do your work, take care of things at home, or get along with other people    Depression Interventions/Treatment      Today's Vitals   03/02/24 1519  Weight: 159 lb (72.1 kg)   Pain Scale: 0-10 Pain Score: 8  Pain Type: Chronic pain Pain Location: Hip Pain Orientation: Left Pain Descriptors / Indicators: Constant Pain Onset: On-going (increases with activity) Patients Stated Pain Goal: 1  Medications Reviewed Today     Reviewed by Georgeana Oertel M, RN (Registered Nurse) on 03/02/24 at 1533  Med List Status: <None>   Medication Order Taking? Sig Documenting Provider Last Dose Status Informant  albuterol  (PROVENTIL ) (2.5 MG/3ML) 0.083% nebulizer solution [id number] Yes USE 1 VIAL IN NEBULIZER EVERY 6 HOURS AND AS NEEDED FOR SHORT BREATH/WHEEZE.  Generic:  Ventolin  Desai, Nikita S, MD  Active Self, Pharmacy Records, Other  allopurinol  (ZYLOPRIM ) 300 MG tablet [id number] Yes Take 1 tablet by mouth once daily Burns, Glade PARAS, MD  Active Self, Pharmacy Records, Other  Blood Glucose Monitoring Suppl (ONETOUCH VERIO FLEX SYSTEM) w/Device  KIT [id number]  1 kit by Does not apply route as directed. 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 Patient request Accu-check meter and testing suppliles Burns, Glade PARAS, MD  Active   budesonide -formoterol  (SYMBICORT ) 160-4.5 MCG/ACT inhaler [id number]  Inhale 2 puffs into the lungs 2 (two) times daily. Ghimire, Donalda HERO, MD  Active Self, Pharmacy Records, Other  cholecalciferol (VITAMIN D3) 25 MCG (1000 UNIT) tablet [id number] Yes Take 2,000 Units by mouth daily. [provider]  Active Self, Pharmacy Records, Other  clopidogrel  (PLAVIX ) 75 MG tablet [id number] Yes Take 1 tablet by mouth once daily Burns, Glade PARAS, MD  Active Self, Pharmacy Records, Other  dapagliflozin  propanediol (FARXIGA ) 10 MG TABS tablet [id number]  Take 1 tablet (10 mg total) by mouth daily.  Patient not taking: Reported on 03/02/2024   Geofm Glade PARAS, MD  Active   diclofenac  Sodium (VOLTAREN ) 1 % GEL [id number]  Apply 2 g topically 4 (four) times daily.  Patient not taking: Reported on 03/02/2024   Gonfa, Taye T, MD  Active   DULoxetine  (CYMBALTA ) 30 MG capsule [id number] Yes Take 2 capsules by mouth once daily Burns, Glade PARAS, MD  Active   ferrous sulfate  325 (65 FE) MG tablet [id number] Yes Take 325 mg by mouth every other day. [provider]  Active Self, Pharmacy Records, Other  fluticasone  (FLONASE ) 50 MCG/ACT nasal spray [id number] Yes Place 2 sprays into both nostrils daily. Soldatova, Liuba, MD  Active Self, Pharmacy Records, Other  Glucose Blood (BLOOD GLUCOSE TEST STRIPS) STRP [id number]  May substitute to any manufacturer covered by patient's insurance. Patient to test glucose 2-3 times daily as needed Geofm Glade PARAS, MD  Active   glucose blood test strip [id number]  Use as instructed Geofm Glade PARAS, MD  Active    Lancet Device MISC [id number]  May substitute to any manufacturer covered by patient's insurance.  Patient to test glucose 2-3 times daily as needed Geofm Glade PARAS, MD  Active   Lancets [facility] ULTRASOFT) lancets [id number]  Use as instructed Geofm Glade PARAS, MD  Active   levocetirizine (XYZAL ) 5 MG tablet [id number] Yes SMARTSIG:1 Tablet(s) By Mouth Every Evening [provider]  Active   metoprolol  succinate (TOPROL -XL) 25 MG 24 hr tablet [id number] Yes Take 1 tablet (25 mg total) by mouth daily. Gonfa, Taye T, MD  Active   midodrine  (PROAMATINE ) 5 MG tablet [id number] Yes Take 1 tablet (5  mg total) by mouth 3 (three) times daily with meals. Geofm Glade PARAS, MD  Active   montelukast  (SINGULAIR ) 10 MG tablet [id number] Yes TAKE 1 TABLET BY MOUTH ONCE DAILY AT BEDTIME Geofm Glade PARAS, MD  Active Self, Pharmacy Records, Other  MOUNJARO  5 MG/0.5ML Pen [id number] Yes INJECT 1/2 (ONE-HALF) ML SUBCUTANEOUSLY  ONCE A WEEK Burns, Glade PARAS, MD  Active   pantoprazole  (PROTONIX ) 40 MG tablet [id number] Yes TAKE 1 TABLET BY MOUTH TWICE DAILY BEFORE A MEAL Burns, Glade PARAS, MD  Active   polyethylene glycol (MIRALAX  / GLYCOLAX ) 17 g packet [id number] Yes Take 17 g by mouth 2 (two) times daily. Christobal Guadalajara, MD  Active   rosuvastatin  (CRESTOR ) 10 MG tablet [id number] Yes Take 1 tablet (10 mg total) by mouth daily. Gonfa, Taye T, MD  Active   sodium chloride  (OCEAN) 0.65 % SOLN nasal spray [id number] Yes Place 1 spray into both nostrils as needed for congestion. Desai, Nikita S, MD  Active Self, Pharmacy Records, Other  tiZANidine  (ZANAFLEX ) 2 MG tablet [id number] Yes Take 1 tablet (2 mg total) by mouth every 6 (six) hours as needed for muscle spasms. Geofm Glade PARAS, MD  Active           Recommendation:   PCP Follow-up Specialty provider follow-up orthopedic provider, nephrologists as scheduled  Follow Up Plan:   Telephone follow up appointment date/time:  04/02/24 at 1:30 pm  Heddy Shutter, RN, MSN, BSN,  CCM [place]  [facility], Population Health Case Manager Phone: [phone]

## 2024-03-02 NOTE — Telephone Encounter (Signed)
 Clarify Kelyse wait bearing status for right upper extremity and wants to request OT therapy. [employer]  home health [phone]

## 2024-03-03 NOTE — Telephone Encounter (Signed)
 Kerry Powell wants to know if she can weight bear for use of walker for record.

## 2024-03-04 NOTE — Telephone Encounter (Signed)
 Kerry Powell is aware.

## 2024-03-06 ENCOUNTER — Other Ambulatory Visit: Payer: Self-pay | Admitting: Internal Medicine

## 2024-03-09 ENCOUNTER — Other Ambulatory Visit: Payer: Self-pay

## 2024-03-09 ENCOUNTER — Encounter: Payer: Self-pay | Admitting: Physician Assistant

## 2024-03-09 ENCOUNTER — Ambulatory Visit: Admitting: Physician Assistant

## 2024-03-09 ENCOUNTER — Other Ambulatory Visit: Payer: Self-pay | Admitting: Physician Assistant

## 2024-03-09 VITALS — Ht 65.0 in | Wt 159.0 lb

## 2024-03-09 DIAGNOSIS — M810 Age-related osteoporosis without current pathological fracture: Secondary | ICD-10-CM

## 2024-03-09 MED ORDER — DENOSUMAB 60 MG/ML ~~LOC~~ SOSY: 60.0000 mg | PREFILLED_SYRINGE | Freq: Once | SUBCUTANEOUS | Status: AC

## 2024-03-09 NOTE — Progress Notes (Signed)
 "  Office Visit Note   Patient: Kerry Powell           Date of Birth: 1945-05-19           MRN: [id number] Visit Date: 03/09/2024              Requested by: Emiliano Leonce CROME, PA-C [address] [place],  [name] [address] PCP: Geofm Glade PARAS, MD   Assessment & Plan: Visit Diagnoses:  1. Age-related osteoporosis without current pathological fracture     Plan: Patient is a pleasant 79 year old woman who is accompanied by her son.  She is here on referral for evaluation of osteoporosis.  She does have a history of a hip and wrist fracture.  She is not currently taking any medication for osteoporosis and I cannot locate a recent bone density scan.  She does have a history of heart disease.  She does have a history of kidney carcinoma and has decreased kidney function.  She has a history of ulcers.  She has a history of going through menopause at age 77 surgical menopause she was not placed on any hormone replacement.  She is not sure how much calcium  or vitamin D  she takes.  She was a former smoker and she does not currently be consume alcoholic beverages.  She is working with home PT on strengthening she is trying to get resolution of the difficulties with her hip as she has a leg length discrepancy that is significant since she is had surgery.  She does not have any issues with dental problems she has no history of family osteoporosis.  I do think we need to look at a few things with her 1 I have given her food lists and information to track her calcium .  I will also have a vitamin D  drawn to check her vitamin D .  I cannot see that she has had any thyroid  or parathyroid  studies we will order those as well and make referral as needed.  With regards to medication I really think the only medication she could take would be Prolia  or Jubbonti.  Because of her kidney disease and her history of ulcers biphosphonate's would not be appropriate.  There are contraindications to both of the  anabolic medications.  I do think she do well on Prolia .  We talked about Prolia  as risks and side effects extensively and her questions were answered.  Will give her information about the medication.  Will order a bone density scan though I do not think this should stop us  from going ahead and trying to get her authorized for Prolia  or Jubbonti.  45 minutes was spent talking to her reviewing her chart and answering her questions  Follow-Up Instructions: Will get authorization for Prolia  or Jubbonti  Orders:  No orders of the defined types were placed in this encounter.  No orders of the defined types were placed in this encounter.     Procedures: No procedures performed   Clinical Data: No additional findings.   Subjective: Chief Complaint  Patient presents with   Osteoporosis    HPI patient is a pleasant 79 year old woman referred for evaluation of osteoporosis  Review of Systems  All other systems reviewed and are negative.    Objective: Vital Signs: Ht 5' 5 (1.651 m)   Wt 159 lb (72.1 kg)   BMI 26.46 kg/m   Physical Exam Pulmonary:     Effort: Pulmonary effort is normal.  Skin:    General:  Skin is warm and dry.  Neurological:     General: No focal deficit present.     Mental Status: She is alert and oriented to person, place, and time.  Psychiatric:        Mood and Affect: Mood normal.        Behavior: Behavior normal.     Ortho Exam  Specialty Comments:  No specialty comments available.  Imaging: No results found.   PMFS History: Patient Active Problem List   Diagnosis Date Noted   Age-related osteoporosis without current pathological fracture 03/09/2024   Closed right scapular fracture 01/28/2024   Acute hypoxic respiratory failure (HCC) 12/20/2023   History of femur fracture 12/09/2023   Orthostatic hypotension 11/22/2023   Protein-calorie malnutrition, severe 10/31/2023   Recurrent falls 10/21/2023   Closed comminuted intertrochanteric  fracture of proximal end of left femur (HCC) 10/21/2023   Chronic respiratory failure with hypoxia (HCC) 10/16/2023   Sterile pyuria 10/07/2023   Hematoma of left lower leg 08/26/2023   Hoarseness, chronic 06/05/2023   Hypoxia 03/06/2023   CKD (chronic kidney disease) stage 4, GFR 15-29 ml/min (HCC) 02/11/2023   Chronic diastolic CHF (congestive heart failure) (HCC) 02/11/2023   PAD (peripheral artery disease) 05/15/2022   Pre-syncope 02/01/2022   Peroneal tendinitis 12/04/2021   Pain in joint of left shoulder 11/16/2021   Osteoarthritis of knees, bilateral 05/04/2021   Pain in joint of right knee 05/04/2021   Osteoarthritis of left knee 05/03/2021   B12 deficiency 08/03/2020   Diabetic polyneuropathy associated with type 2 diabetes mellitus (HCC) 07/29/2020   Diastolic dysfunction without heart failure 01/20/2020   Pain of left thumb 12/24/2019   Pain of right thumb 12/24/2019   Polyarthralgia 12/07/2019   Anemia 12/07/2019   Fatigue 12/07/2019   Acquired thrombophilia 07/14/2019   CAD (coronary artery disease), native coronary artery    Cervical spine pain 06/16/2019   Pleural plaque 04/27/2019   Muscle spasm 11/14/2018   SOB (shortness of breath) 04/17/2018   Vitamin D  deficiency 03/12/2018   Thoracic aortic aneurysm 02/06/2018   Pain in left knee 01/14/2018   Sleep difficulties 09/09/2017   Depression 07/01/2017   Dizziness 06/10/2017   H/O renal cell carcinoma 05/23/2017   History of kidney cancer 05/23/2017   Cervical radiculopathy 10/31/2016   Hyperlipidemia associated with type 2 diabetes mellitus (HCC) 06/25/2016   Occipital neuralgia 05/09/2016   Anxiety 07/26/2015   Chronic neck pain 04/30/2015   Type 2 diabetes mellitus with stage 4 chronic kidney disease, without long-term current use of insulin  (HCC) 04/27/2015   History of CVA (cerebrovascular accident) 04/26/2015   Gout 04/26/2015   Hypertension associated with type 2 diabetes mellitus (HCC) 04/26/2015    Snoring 04/26/2015   Bilateral leg edema 04/26/2015   Dysphagia 04/26/2015   GERD (gastroesophageal reflux disease) 04/26/2015   History of colonic polyps 05/29/2007   HIATAL HERNIA 04/07/2007   RESTLESS LEG SYNDROME 04/04/2007   RHINOSINUSITIS, 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   COPD (chronic obstructive pulmonary disease) (HCC) 04/04/2007   IRRITABLE BOWEL SYNDROME 04/04/2007   Osteoarthritis, diffuse 04/04/2007   Past Medical History:  Diagnosis Date   Allergy    Anemia    Anxiety    Arthritis    BACK PAIN, CHRONIC 04/04/2007   Qualifier: Diagnosis of  By: [name], [name]    BIPOLAR AFFECTIVE DISORDER 04/04/2007   Qualifier: History of  By: [name], [name] E    CAD (coronary artery disease), native coronary artery  mild CAD with no obstructive disease by coronary CTA 06/2019   Cataract    removed both eyes   CHF (congestive heart failure) (HCC)    Chronic neck pain 04/30/2015   Cervical spondylosis [facility] Intermittent numbness and tingling in occipital region and headaches S/p 2 occipital nerve blocks - only temp relief no relief with gabapentin, ultram , brace MRI neck - facet arthrosis    CKD (chronic kidney disease) 04/26/2015   COPD (chronic obstructive pulmonary disease) (HCC)    pt denies - told this while was smoking -   Cough 04/26/2015   Depression 07/01/2017   Diabetes (HCC) 04/27/2015   Diagnosed 03/2015    Dizziness 06/10/2017   Dysphagia 04/26/2015   DYSPNEA 12/24/2007   Qualifier: Diagnosis of  By: [name]     Elevated TSH 08/09/2015   GASTRIC ULCER, ACUTE 04/07/2007   Annotation: EGD 04/07/07 Qualifier: Diagnosis of  By: [name], NOLIA Pitts E    GASTRITIS 04/07/2007   Annotation: EGD 04/07/07 Qualifier: Diagnosis of  By: [name], NOLIA Pitts E    GERD (gastroesophageal reflux disease)    H/O renal cell carcinoma 05/23/2017   Dx 2010, s/p cryoablation Follows with urology - Dr [name]   HIATAL HERNIA 04/07/2007    Annotation: EGD 04/07/07 Qualifier: Diagnosis of  By: [name], NOLIA Pitts E    History of CVA (cerebrovascular accident) 04/26/2015   History of gout 04/26/2015   Hyperlipidemia    Hypertension    Hypoxia 03/06/2023   Irritable bowel syndrome 04/04/2007   Qualifier: Diagnosis of  By: [name], NOLIA Pitts E    Kidney tumor (benign)    Nausea and vomiting 09/09/2017   Occipital neuralgia 05/09/2016   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 Dr [name] in [place]   OSTEOARTHRITIS 04/04/2007   Qualifier: Diagnosis of  By: [name], NOLIA Pitts BRAVO    Personal history of colonic adenoma 06/07/2012   POSTNASAL DRIP SYNDROME 12/29/2007   Qualifier: Diagnosis of  By: [name], [name]     RESTLESS LEG SYNDROME 04/04/2007   Qualifier: Diagnosis of  By: [name], NOLIA Pitts BRAVO    RHINOSINUSITIS, 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   Qualifier: Diagnosis of  By: [name], NOLIA Pitts BRAVO    Sleep difficulties 09/09/2017   Snoring 04/26/2015   Stroke (HCC)    x2 2011   Thoracic aortic aneurysm 02/06/2018   Seen on MRI 01/25/18 of thoracic spine ordered by [facility] -- referred to CTS to monitor  -- 3.5 cm   TIA (transient ischemic attack)    TOBACCO ABUSE 12/17/2007   Qualifier: Diagnosis of  By: [name], [name]     Trigeminal neuralgia of right side of face 02/08/2016   URI (upper respiratory infection) 02/08/2016   Vitamin D  deficiency 03/12/2018    Family History  Problem Relation Age of Onset   Congestive Heart Failure Mother    Cancer Mother        ovarian   Asthma Mother    Heart disease Mother    Heart disease Father    Diabetes Father    Kidney disease Father    Hyperlipidemia Father    Stroke Father    Diabetes Sister    Kidney disease Sister    Miscarriages / Stillbirths Sister    Arthritis Maternal Grandmother    Hyperlipidemia Maternal Grandmother    Hypertension Maternal Grandmother    Obesity Maternal Grandmother    Arthritis Paternal Grandfather    Hypertension  Paternal Grandfather    Kidney disease Paternal Grandfather    Diabetes Paternal Aunt    Diabetes Paternal Uncle    Learning disabilities Brother    Anxiety disorder Son    Diabetes Son    Learning disabilities Son    Depression Maternal Aunt    Colon cancer Neg Hx    Colon polyps Neg Hx    Esophageal cancer Neg Hx    Rectal cancer Neg Hx    Stomach cancer Neg Hx     Past Surgical History:  Procedure Laterality Date   ABDOMINAL HYSTERECTOMY     APPENDECTOMY     APPLICATION OF WOUND VAC Left 10/09/2023   Procedure: APPLICATION, WOUND VAC;  Surgeon: [name], MD;  Location: [facility] OR;  Service: Orthopedics;  Laterality: Left;   CATARACT EXTRACTION     COLONOSCOPY     CYSTOCELE REPAIR     ESOPHAGOGASTRODUODENOSCOPY     FRACTURE SURGERY  2007   INCISION AND DRAINAGE OF DEEP ABSCESS, CALF Left 10/09/2023   Procedure: INCISION AND DRAINAGE OF DEEP ABSCESS, CALF;  Surgeon: [name], MD;  Location: [facility] OR;  Service: Orthopedics;  Laterality: Left;   INTRAMEDULLARY (IM) NAIL INTERTROCHANTERIC Left 10/24/2023   Procedure: FIXATION, FRACTURE, INTERTROCHANTERIC, WITH INTRAMEDULLARY ROD;  Surgeon: [name], MD;  Location: [facility] OR;  Service: Orthopedics;  Laterality: Left;   KIDNEY SURGERY     benign tumor removal   ORTHOPEDIC SURGERY     Wrist & elbow   POLYPECTOMY     RECTOCELE REPAIR     TONSILECTOMY, ADENOIDECTOMY, BILATERAL MYRINGOTOMY AND TUBES Bilateral 1968   UPPER GASTROINTESTINAL ENDOSCOPY     Social History   Occupational History   Occupation: Retired  Tobacco Use   Smoking status: Former    Current packs/day: 0.00    Average packs/day: 1 pack/day for 46.0 years (46.0 ttl pk-yrs)    Types: Cigarettes    Start date: 07/01/1963    Quit date: 06/30/2009    Years since quitting: 14.7    Passive exposure: Past   Smokeless tobacco: Never  Vaping Use   Vaping status: Never Used  Substance and Sexual Activity   Alcohol  use: No   Drug use: No   Sexual activity: Not  Currently        "

## 2024-03-10 ENCOUNTER — Encounter: Payer: Self-pay | Admitting: Internal Medicine

## 2024-03-10 LAB — VITAMIN D 25 HYDROXY (VIT D DEFICIENCY, FRACTURES): Vit D, 25-Hydroxy: 57.2 ng/mL (ref 30.0–100.0)

## 2024-03-10 LAB — PARATHYROID HORMONE, INTACT (NO CA): PTH: 14 pg/mL — AB (ref 15–65)

## 2024-03-10 LAB — TSH: TSH: 3.15 u[IU]/mL (ref 0.450–4.500)

## 2024-03-11 ENCOUNTER — Ambulatory Visit (HOSPITAL_BASED_OUTPATIENT_CLINIC_OR_DEPARTMENT_OTHER)

## 2024-03-11 ENCOUNTER — Ambulatory Visit (HOSPITAL_BASED_OUTPATIENT_CLINIC_OR_DEPARTMENT_OTHER): Admitting: Orthopaedic Surgery

## 2024-03-11 DIAGNOSIS — M25552 Pain in left hip: Secondary | ICD-10-CM

## 2024-03-11 NOTE — Progress Notes (Signed)
 "   Chief Complaint: Left hip pain     History of Present Illness:    Kerry Powell is a 79 y.o. female status post left hip cephalomedullary nailing with a short nail with Dr. Celena on October 24, 2023.  Since that time she has been treated with osteoporosis management.  She has been having a difficult time weightbearing on the side.  She experiences pain in the hip joint with activity.  She is fully walker dependent although she was previously completely ambulatory without assistive devices.  She is here today for additional opinion.  She is essentially wheelchair-bound at this time    PMH/PSH/Family History/Social History/Meds/Allergies:    Past Medical History:  Diagnosis Date   Allergy    Anemia    Anxiety    Arthritis    BACK PAIN, CHRONIC 04/04/2007   Qualifier: Diagnosis of  By: [name], [name]    BIPOLAR AFFECTIVE DISORDER 04/04/2007   Qualifier: History of  By: [name], [name] E    CAD (coronary artery disease), native coronary artery    mild CAD with no obstructive disease by coronary CTA 06/2019   Cataract    removed both eyes   CHF (congestive heart failure) (HCC)    Chronic neck pain 04/30/2015   Cervical spondylosis [facility] Intermittent numbness and tingling in occipital region and headaches S/p 2 occipital nerve blocks - only temp relief no relief with gabapentin, ultram , brace MRI neck - facet arthrosis    CKD (chronic kidney disease) 04/26/2015   COPD (chronic obstructive pulmonary disease) (HCC)    pt denies - told this while was smoking -   Cough 04/26/2015   Depression 07/01/2017   Diabetes (HCC) 04/27/2015   Diagnosed 03/2015    Dizziness 06/10/2017   Dysphagia 04/26/2015   DYSPNEA 12/24/2007   Qualifier: Diagnosis of  By: [name]     Elevated TSH 08/09/2015   GASTRIC ULCER, ACUTE 04/07/2007   Annotation: EGD 04/07/07 Qualifier: Diagnosis of  By: [name], [name] E    GASTRITIS 04/07/2007   Annotation:  EGD 04/07/07 Qualifier: Diagnosis of  By: [name], [name] E    GERD (gastroesophageal reflux disease)    H/O renal cell carcinoma 05/23/2017   Dx 2010, s/p cryoablation Follows with urology - Dr [name]   HIATAL HERNIA 04/07/2007   Annotation: EGD 04/07/07 Qualifier: Diagnosis of  By: [name], [name] E    History of CVA (cerebrovascular accident) 04/26/2015   History of gout 04/26/2015   Hyperlipidemia    Hypertension    Hypoxia 03/06/2023   Irritable bowel syndrome 04/04/2007   Qualifier: Diagnosis of  By: [name], [name]    Kidney tumor (benign)    Nausea and vomiting 09/09/2017   Occipital neuralgia 05/09/2016   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 Dr [name] in [place]   OSTEOARTHRITIS 04/04/2007   Qualifier: Diagnosis of  By: [name], [name]    Personal history of colonic adenoma 06/07/2012   POSTNASAL DRIP SYNDROME 12/29/2007   Qualifier: Diagnosis of  By: [name], [name]     RESTLESS LEG SYNDROME 04/04/2007   Qualifier: Diagnosis of  By: [name], [name]    RHINOSINUSITIS, 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   Qualifier: Diagnosis of  By: [name], [name]    Sleep difficulties 09/09/2017   Snoring 04/26/2015   Stroke (HCC)    x2 2011   Thoracic aortic aneurysm 02/06/2018  Seen on MRI 01/25/18 of thoracic spine ordered by [facility] -- referred to CTS to monitor  -- 3.5 cm   TIA (transient ischemic attack)    TOBACCO ABUSE 12/17/2007   Qualifier: Diagnosis of  By: [name], [name]     Trigeminal neuralgia of right side of face 02/08/2016   URI (upper respiratory infection) 02/08/2016   Vitamin D  deficiency 03/12/2018   Past Surgical History:  Procedure Laterality Date   ABDOMINAL HYSTERECTOMY     APPENDECTOMY     APPLICATION OF WOUND VAC Left 10/09/2023   Procedure: APPLICATION, WOUND VAC;  Surgeon: [name], MD;  Location: [facility] OR;  Service: Orthopedics;  Laterality: Left;   CATARACT EXTRACTION     COLONOSCOPY      CYSTOCELE REPAIR     ESOPHAGOGASTRODUODENOSCOPY     FRACTURE SURGERY  2007   INCISION AND DRAINAGE OF DEEP ABSCESS, CALF Left 10/09/2023   Procedure: INCISION AND DRAINAGE OF DEEP ABSCESS, CALF;  Surgeon: [name], MD;  Location: [facility] OR;  Service: Orthopedics;  Laterality: Left;   INTRAMEDULLARY (IM) NAIL INTERTROCHANTERIC Left 10/24/2023   Procedure: FIXATION, FRACTURE, INTERTROCHANTERIC, WITH INTRAMEDULLARY ROD;  Surgeon: [name], MD;  Location: [facility] OR;  Service: Orthopedics;  Laterality: Left;   KIDNEY SURGERY     benign tumor removal   ORTHOPEDIC SURGERY     Wrist & elbow   POLYPECTOMY     RECTOCELE REPAIR     TONSILECTOMY, ADENOIDECTOMY, BILATERAL MYRINGOTOMY AND TUBES Bilateral 1968   UPPER GASTROINTESTINAL ENDOSCOPY     Social History   Socioeconomic History   Marital status: Married    Spouse name: Not on file   Number of children: 2   Years of education: Not on file   Highest education level: 12th grade  Occupational History   Occupation: Retired  Tobacco Use   Smoking status: Former    Current packs/day: 0.00    Average packs/day: 1 pack/day for 46.0 years (46.0 ttl pk-yrs)    Types: Cigarettes    Start date: 07/01/1963    Quit date: 06/30/2009    Years since quitting: 14.7    Passive exposure: Past   Smokeless tobacco: Never  Vaping Use   Vaping status: Never Used  Substance and Sexual Activity   Alcohol  use: No   Drug use: No   Sexual activity: Not Currently  Other Topics Concern   Not on file  Social History Narrative   Married, retired   1 son 1 daughter   2 caffeine/day   2 story home, but doesn't have to use stairs - son stays upstairs   Social Drivers of Health   Tobacco Use: Medium Risk (03/09/2024)   Patient History    Smoking Tobacco Use: Former    Smokeless Tobacco Use: Never    Passive Exposure: Past  Physicist, Medical Strain: Low Risk (12/08/2023)   Overall Financial Resource Strain (CARDIA)    Difficulty of Paying Living  Expenses: Not very hard  Food Insecurity: No Food Insecurity (12/21/2023)   Epic    Worried About Radiation Protection Practitioner of Food in the Last Year: Never true    Ran Out of Food in the Last Year: Never true  Transportation Needs: No Transportation Needs (12/21/2023)   Epic    Lack of Transportation (Medical): No    Lack of Transportation (Non-Medical): No  Physical Activity: Inactive (12/08/2023)   Exercise Vital Sign    Days of Exercise per Week: 0 days    Minutes  of Exercise per Session: Not on file  Stress: Stress Concern Present (12/08/2023)   [employer] of Occupational Health - Occupational Stress Questionnaire    Feeling of Stress: To some extent  Social Connections: Unknown (12/24/2023)   Social Connection and Isolation Panel    Frequency of Communication with Friends and Family: Not on file    Frequency of Social Gatherings with Friends and Family: Not on file    Attends Religious Services: Not on file    Active Member of Clubs or Organizations: No    Attends Banker Meetings: Not on file    Marital Status: Not on file  Recent Concern: Social Connections - Socially Isolated (12/21/2023)   Social Connection and Isolation Panel    Frequency of Communication with Friends and Family: Once a week    Frequency of Social Gatherings with Friends and Family: Never    Attends Religious Services: Never    Database Administrator or Organizations: Not on file    Attends Banker Meetings: Never    Marital Status: Married  Depression (PHQ2-9): Low Risk (03/02/2024)   Depression (PHQ2-9)    PHQ-2 Score: 0  Recent Concern: Depression (PHQ2-9) - High Risk (01/29/2024)   Depression (PHQ2-9)    PHQ-2 Score: 24  Alcohol  Screen: Low Risk (05/15/2023)   Alcohol  Screen    Last Alcohol  Screening Score (AUDIT): 0  Housing: Low Risk (12/21/2023)   Epic    Unable to Pay for Housing in the Last Year: No    Number of Times Moved in the Last Year: 0    Homeless in the Last  Year: No  Utilities: Not At Risk (12/21/2023)   Epic    Threatened with loss of utilities: No  Health Literacy: Adequate Health Literacy (05/15/2023)   B1300 Health Literacy    Frequency of need for help with medical instructions: Never   Family History  Problem Relation Age of Onset   Congestive Heart Failure Mother    Cancer Mother        ovarian   Asthma Mother    Heart disease Mother    Heart disease Father    Diabetes Father    Kidney disease Father    Hyperlipidemia Father    Stroke Father    Diabetes Sister    Kidney disease Sister    Miscarriages / Stillbirths Sister    Arthritis Maternal Grandmother    Hyperlipidemia Maternal Grandmother    Hypertension Maternal Grandmother    Obesity Maternal Grandmother    Arthritis Paternal Grandfather    Hypertension Paternal Grandfather    Kidney disease Paternal Grandfather    Diabetes Paternal Aunt    Diabetes Paternal Uncle    Learning disabilities Brother    Anxiety disorder Son    Diabetes Son    Learning disabilities Son    Depression Maternal Aunt    Colon cancer Neg Hx    Colon polyps Neg Hx    Esophageal cancer Neg Hx    Rectal cancer Neg Hx    Stomach cancer Neg Hx    Allergies[1] Current Outpatient Medications  Medication Sig Dispense Refill   albuterol  (PROVENTIL ) (2.5 MG/3ML) 0.083% nebulizer solution USE 1 VIAL IN NEBULIZER EVERY 6 HOURS AND AS NEEDED FOR SHORT BREATH/WHEEZE.  Generic:  Ventolin  75 mL 2   allopurinol  (ZYLOPRIM ) 300 MG tablet Take 1 tablet by mouth once daily 90 tablet 0   Blood Glucose Monitoring Suppl (ONETOUCH VERIO FLEX SYSTEM) w/Device KIT 1 kit  by Does not apply route as directed. 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 Patient request Accu-check meter and testing suppliles 1 kit 0   budesonide -formoterol   (SYMBICORT ) 160-4.5 MCG/ACT inhaler Inhale 2 puffs into the lungs 2 (two) times daily. 1 each 12   cholecalciferol (VITAMIN D3) 25 MCG (1000 UNIT) tablet Take 2,000 Units by mouth daily.     clopidogrel  (PLAVIX ) 75 MG tablet Take 1 tablet by mouth once daily 90 tablet 0   dapagliflozin  propanediol (FARXIGA ) 10 MG TABS tablet Take 1 tablet (10 mg total) by mouth daily. (Patient not taking: Reported on 03/02/2024)     diclofenac  Sodium (VOLTAREN ) 1 % GEL Apply 2 g topically 4 (four) times daily. (Patient not taking: Reported on 03/02/2024)     DULoxetine  (CYMBALTA ) 30 MG capsule Take 2 capsules by mouth once daily 60 capsule 0   ferrous sulfate  325 (65 FE) MG tablet Take 325 mg by mouth every other day.     fluticasone  (FLONASE ) 50 MCG/ACT nasal spray Place 2 sprays into both nostrils daily. 16 g 6   Glucose Blood (BLOOD GLUCOSE TEST STRIPS) STRP May substitute to any manufacturer covered by patient's insurance. Patient to test glucose 2-3 times daily as needed 200 strip 11   glucose blood test strip Use as instructed 100 each 12   Lancet Device MISC May substitute to any manufacturer covered by patient's insurance.  Patient to test glucose 2-3 times daily as needed 1 each 11   Lancets (ONETOUCH ULTRASOFT) lancets Use as instructed 100 each 12   levocetirizine (XYZAL ) 5 MG tablet SMARTSIG:1 Tablet(s) By Mouth Every Evening     metoprolol  succinate (TOPROL -XL) 25 MG 24 hr tablet Take 1 tablet (25 mg total) by mouth daily.     midodrine  (PROAMATINE ) 5 MG tablet Take 1 tablet (5 mg total) by mouth 3 (three) times daily with meals. 90 tablet 2   montelukast  (SINGULAIR ) 10 MG tablet TAKE 1 TABLET BY MOUTH ONCE DAILY AT BEDTIME 30 tablet 5   MOUNJARO  5 MG/0.5ML Pen INJECT 1/2 (ONE-HALF) ML SUBCUTANEOUSLY  ONCE A WEEK 4 mL 0   pantoprazole  (PROTONIX ) 40 MG tablet TAKE 1 TABLET BY MOUTH TWICE DAILY BEFORE A MEAL 180 tablet 0   polyethylene glycol (MIRALAX  / GLYCOLAX ) 17 g packet Take 17 g by mouth 2 (two)  times daily. 14 each 0   rosuvastatin  (CRESTOR ) 10 MG tablet Take 1 tablet (10 mg total) by mouth daily.     sodium chloride  (OCEAN) 0.65 % SOLN nasal spray Place 1 spray into both nostrils as needed for congestion. 480 mL 1   tiZANidine  (ZANAFLEX ) 2 MG tablet Take 1 tablet (2 mg total) by mouth every 6 (six) hours as needed for muscle spasms. 60 tablet 2   Current Facility-Administered Medications  Medication Dose Route Frequency Provider Last Rate Last Admin   [START ON 03/23/2024] denosumab  (PROLIA ) injection 60 mg  60 mg Subcutaneous Once        No results found.  Review of Systems:   A ROS was performed including pertinent positives and negatives as documented in the HPI.  Physical Exam :   Constitutional: NAD and appears stated age Neurological: Alert and oriented Psych: Appropriate affect and cooperative There were no vitals taken for this visit.   Comprehensive Musculoskeletal Exam:    Left  hip with tenderness with gentle logroll 30 degrees internal/external rotation of the left hip.  She is wheelchair-bound so I am unable to assess her gait.  She has very little active flexion about the left hip   Imaging:   Xray (3 views left hip): Status post cephalomedullary nailing with some migration of the screw in the cephalad direction and significant compression at the fracture site    I personally reviewed and interpreted the radiographs.   Assessment and Plan:   79 y.o. female left hip pain in the setting of fracture compression as well as some mild cutting out of the lag screw component of the nail.  At this time she is still unable to become fully ambulatory.  I did discuss that I am concerned for delayed healing.  Given this we will plan for referral to Dr. Mariah for discussion of semiurgent hip arthroplasty.  I do believe this will be required to promote ambulatory status.   I personally saw and evaluated the patient, and participated in the management and treatment  plan.  Elspeth Parker, MD Attending Physician, Orthopedic Surgery  This document was dictated using Dragon voice recognition software. A reasonable attempt at proof reading has been made to minimize errors.    [1]  Allergies Allergen Reactions   Gabapentin Other (See Comments)    Fluid retention    Spiriva  Respimat [Tiotropium Bromide ] Other (See Comments)    Tongue swelling   Clindamycin  Rash   Sulfa Antibiotics Nausea And Vomiting   "

## 2024-03-12 ENCOUNTER — Encounter: Payer: Self-pay | Admitting: Internal Medicine

## 2024-03-12 NOTE — Progress Notes (Signed)
 "     Subjective:    Patient ID: Kerry Powell, female    DOB: 10-26-1945, 79 y.o.   MRN: [id number]     HPI Kerry Powell is here for follow up of her chronic medical problems.  She saw Dr Genelle this week and he advised a semiurgent hip arthroplasty due to delayed healing.  Pain is significant.    Weight stable x 1 month BP - still orthostatic Sugars well controlled    Discussed the use of AI scribe software for clinical note transcription with the patient, who gave verbal consent to proceed.  History of Present Illness Kerry Powell is a 79 year old female with a history of hip issues who presents with persistent hip pain and difficulty with mobility.  She experiences persistent and severe hip pain, exacerbated when lying down, causing significant sleep disturbances. The pain worsens when changing positions, particularly when lying on her right side, due to a loose rod in her hip. She rates her pain as a 6 out of 10 when sitting still, but it intensifies significantly when lying down.  She has a history of hip surgery, and recent evaluations have shown that a screw is boring into the ball of her hip, causing the rod to slip. This has significantly impacted her mobility and quality of life.  She manages her pain with medications, including oxycodone  and tramadol . She also experiences orthostatic hypotension, with blood pressure dropping upon standing, and is currently taking midodrine  three times a day to manage this condition.  She has osteoporosis and is monitoring her calcium  intake closely. She has a history of swallowing difficulties, particularly with liquids, which she describes as 'taking her breath away'. She has had previous esophageal dilation procedures and reports that the sensation of something being stuck in her throat persists, especially with larger pills.   No recent fevers, depression, anxiety, headaches, or leg swelling. She experiences lightheadedness upon  standing.    She requires a transport chair for getting in and out of doctors appointment.   Patient suffers from COPD with chronic respiratory failure, HFpEF, b/l knee osteoarthritis and closed comminuted intertrochanteric fracture of proximal end of left femur which impairs her ability to get to and from doctors visits.  She is currently non-weight bearing.  She also is not able to perform daily activities like toileting, feeding, dressing, grooming, bathing in the home. A cane, walker or crutch will not resolve issue with performing activities of daily living. A transport chair will allow her to safely perform daily activities and get into doctor offices. Patient has a caregiver who can provide assistance.    Medications and allergies reviewed with patient and updated if appropriate.  Medications Ordered Prior to Encounter[1]   Review of Systems  Constitutional:  Negative for fever.  HENT:  Positive for trouble swallowing (with food and water).   Respiratory:  Positive for shortness of breath. Negative for cough and wheezing.   Cardiovascular:  Negative for chest pain, palpitations and leg swelling.  Gastrointestinal:        Thalia controlled  Neurological:  Positive for light-headedness (when gets up or sits up). Negative for headaches.  Psychiatric/Behavioral:  Negative for dysphoric mood. The patient is not nervous/anxious.        Objective:   Vitals:   03/13/24 1358  BP: 114/70  Pulse: 77  Temp: 98.4 F (36.9 C)  SpO2: 94%   BP Readings from Last 3 Encounters:  03/13/24 114/70  01/29/24 132/80  01/27/24 ROLLEN)  125/107   Wt Readings from Last 3 Encounters:  03/13/24 171 lb (77.6 kg)  03/09/24 159 lb (72.1 kg)  03/02/24 159 lb (72.1 kg)   Body mass index is 28.46 kg/m.    Physical Exam Constitutional:      General: She is not in acute distress.    Appearance: Normal appearance.  HENT:     Head: Normocephalic and atraumatic.  Eyes:     Conjunctiva/sclera:  Conjunctivae normal.  Cardiovascular:     Rate and Rhythm: Normal rate and regular rhythm.     Heart sounds: Normal heart sounds.  Pulmonary:     Effort: Pulmonary effort is normal. No respiratory distress.     Breath sounds: Normal breath sounds. No wheezing.  Musculoskeletal:     Cervical back: Neck supple.     Right lower leg: No edema.     Left lower leg: No edema.  Lymphadenopathy:     Cervical: No cervical adenopathy.  Skin:    General: Skin is warm and dry.     Findings: No rash.  Neurological:     Mental Status: She is alert. Mental status is at baseline.     Motor: Weakness (normal strength in b/l hands, weakness in legs - currently not weight bearing) present.  Psychiatric:        Mood and Affect: Mood normal.        Behavior: Behavior normal.        Lab Results  Component Value Date   WBC 10.3 01/27/2024   HGB 10.7 (L) 01/27/2024   HCT 34.2 (L) 01/27/2024   PLT 394 01/27/2024   GLUCOSE 103 (H) 01/27/2024   CHOL 119 06/05/2023   TRIG 101.0 06/05/2023   HDL 41.90 06/05/2023   LDLDIRECT 79.0 08/01/2020   LDLCALC 57 06/05/2023   ALT 11 12/20/2023   AST 22 12/20/2023   NA 136 01/27/2024   K 3.6 01/27/2024   CL 99 01/27/2024   CREATININE 1.31 (H) 01/27/2024   BUN 11 01/27/2024   CO2 27 01/27/2024   TSH 3.150 03/09/2024   INR 1.0 12/20/2023   HGBA1C 6.4 (H) 10/07/2023   MICROALBUR 0.9 06/05/2023     Assessment & Plan:    See Problem List for Assessment and Plan of chronic medical problems.       [1]  Current Outpatient Medications on File Prior to Visit  Medication Sig Dispense Refill   albuterol  (PROVENTIL ) (2.5 MG/3ML) 0.083% nebulizer solution USE 1 VIAL IN NEBULIZER EVERY 6 HOURS AND AS NEEDED FOR SHORT BREATH/WHEEZE.  Generic:  Ventolin  75 mL 2   allopurinol  (ZYLOPRIM ) 300 MG tablet Take 1 tablet by mouth once daily 90 tablet 0   Blood Glucose Monitoring Suppl (ONETOUCH VERIO FLEX SYSTEM) w/Device KIT 1 kit by Does not apply route as  directed. 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 Patient request Accu-check meter and testing suppliles 1 kit 0   cholecalciferol (VITAMIN D3) 25 MCG (1000 UNIT) tablet Take 2,000 Units by mouth daily.     clopidogrel  (PLAVIX ) 75 MG tablet Take 1 tablet by mouth once daily 90 tablet 0   DULoxetine  (CYMBALTA ) 30 MG capsule Take 2 capsules by mouth once daily 60 capsule 0   ferrous sulfate  325 (65 FE) MG tablet Take 325 mg by mouth every other day.  fluticasone  (FLONASE ) 50 MCG/ACT nasal spray Place 2 sprays into both nostrils daily. 16 g 6   Glucose Blood (BLOOD GLUCOSE TEST STRIPS) STRP May substitute to any manufacturer covered by patient's insurance. Patient to test glucose 2-3 times daily as needed 200 strip 11   glucose blood test strip Use as instructed 100 each 12   Lancet Device MISC May substitute to any manufacturer covered by patient's insurance.  Patient to test glucose 2-3 times daily as needed 1 each 11   Lancets (ONETOUCH ULTRASOFT) lancets Use as instructed 100 each 12   levocetirizine (XYZAL ) 5 MG tablet SMARTSIG:1 Tablet(s) By Mouth Every Evening     metoprolol  succinate (TOPROL -XL) 25 MG 24 hr tablet Take 1 tablet (25 mg total) by mouth daily.     midodrine  (PROAMATINE ) 5 MG tablet Take 1 tablet (5 mg total) by mouth 3 (three) times daily with meals. 90 tablet 2   montelukast  (SINGULAIR ) 10 MG tablet TAKE 1 TABLET BY MOUTH ONCE DAILY AT BEDTIME 30 tablet 5   MOUNJARO  5 MG/0.5ML Pen INJECT 1/2 (ONE-HALF) ML SUBCUTANEOUSLY  ONCE A WEEK 4 mL 0   pantoprazole  (PROTONIX ) 40 MG tablet TAKE 1 TABLET BY MOUTH TWICE DAILY BEFORE A MEAL 180 tablet 0   polyethylene glycol (MIRALAX  / GLYCOLAX ) 17 g packet Take 17 g by mouth 2 (two) times daily. 14 each 0   rosuvastatin  (CRESTOR ) 10 MG tablet Take 1 tablet (10 mg  total) by mouth daily.     sodium chloride  (OCEAN) 0.65 % SOLN nasal spray Place 1 spray into both nostrils as needed for congestion. 480 mL 1   tiZANidine  (ZANAFLEX ) 2 MG tablet Take 1 tablet (2 mg total) by mouth every 6 (six) hours as needed for muscle spasms. 60 tablet 2   dapagliflozin  propanediol (FARXIGA ) 10 MG TABS tablet Take 1 tablet (10 mg total) by mouth daily. (Patient not taking: Reported on 03/13/2024)     Current Facility-Administered Medications on File Prior to Visit  Medication Dose Route Frequency Provider Last Rate Last Admin   [START ON 03/23/2024] denosumab  (PROLIA ) injection 60 mg  60 mg Subcutaneous Once        "

## 2024-03-13 ENCOUNTER — Ambulatory Visit: Admitting: Internal Medicine

## 2024-03-13 VITALS — BP 114/70 | HR 77 | Temp 98.4°F | Ht 65.0 in | Wt 171.0 lb

## 2024-03-13 DIAGNOSIS — N184 Chronic kidney disease, stage 4 (severe): Secondary | ICD-10-CM | POA: Diagnosis not present

## 2024-03-13 DIAGNOSIS — S72142D Displaced intertrochanteric fracture of left femur, subsequent encounter for closed fracture with routine healing: Secondary | ICD-10-CM

## 2024-03-13 DIAGNOSIS — E1122 Type 2 diabetes mellitus with diabetic chronic kidney disease: Secondary | ICD-10-CM

## 2024-03-13 DIAGNOSIS — J9611 Chronic respiratory failure with hypoxia: Secondary | ICD-10-CM | POA: Diagnosis not present

## 2024-03-13 DIAGNOSIS — R1310 Dysphagia, unspecified: Secondary | ICD-10-CM

## 2024-03-13 DIAGNOSIS — E1159 Type 2 diabetes mellitus with other circulatory complications: Secondary | ICD-10-CM | POA: Diagnosis not present

## 2024-03-13 DIAGNOSIS — I152 Hypertension secondary to endocrine disorders: Secondary | ICD-10-CM

## 2024-03-13 DIAGNOSIS — J432 Centrilobular emphysema: Secondary | ICD-10-CM

## 2024-03-13 DIAGNOSIS — M170 Bilateral primary osteoarthritis of knee: Secondary | ICD-10-CM | POA: Diagnosis not present

## 2024-03-13 DIAGNOSIS — I5032 Chronic diastolic (congestive) heart failure: Secondary | ICD-10-CM | POA: Diagnosis not present

## 2024-03-13 NOTE — Assessment & Plan Note (Signed)
 Chronic Still requires oxygen intermittently On 2 L / min via  prn

## 2024-03-13 NOTE — Assessment & Plan Note (Signed)
 Chronic Blood pressure elevated at times but also has orthostatic hypotension Currently taking metoprolol  25 mg twice daily and midodrine  5 mg TID Monitor BP at home

## 2024-03-13 NOTE — Patient Instructions (Addendum)
" ° ° ° ° ° °  Medications changes include :   None      Return for follow up after surgery/rehab.  "

## 2024-03-13 NOTE — Assessment & Plan Note (Signed)
 Chronic Moderate centrilobular emphysema Chronic respiratory failure with hypoxia-on oxygen intermittently Spiriva  and symbicort  discontinued because of them causing tongue swelling Nebulizer treatments as needed

## 2024-03-13 NOTE — Assessment & Plan Note (Signed)
 Chronic Euvolemic  Not currently on any diuretic

## 2024-03-13 NOTE — Assessment & Plan Note (Signed)
 Subacute Getting worse Difficulty with water, food and large pills Hx of esophageal stricture s/p dilation in the past Discussed swallow eval - she deferred for now --she is likely going for surgery next week

## 2024-03-13 NOTE — Assessment & Plan Note (Signed)
 Chronic  Fall at home resulted in intratrochanteric fracture of proximal left femur S/p ORIF with IM rod S/p rehab Rod is not coming out resulting in a lot of pain and need for a total hip replacement which will hopefully happen next week Deferred pain management due to side effects from pain medication

## 2024-03-13 NOTE — Assessment & Plan Note (Signed)
 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

## 2024-03-13 NOTE — Assessment & Plan Note (Signed)
 Chronic B/l OA Following with orthopedics

## 2024-03-16 ENCOUNTER — Telehealth: Payer: Self-pay

## 2024-03-16 ENCOUNTER — Other Ambulatory Visit: Payer: Self-pay | Admitting: Radiology

## 2024-03-16 ENCOUNTER — Ambulatory Visit

## 2024-03-16 VITALS — BP 132/86 | HR 101 | Ht 65.0 in | Wt 171.0 lb

## 2024-03-16 DIAGNOSIS — M810 Age-related osteoporosis without current pathological fracture: Secondary | ICD-10-CM

## 2024-03-16 DIAGNOSIS — Z01818 Encounter for other preprocedural examination: Secondary | ICD-10-CM | POA: Diagnosis not present

## 2024-03-16 DIAGNOSIS — T84498A Other mechanical complication of other internal orthopedic devices, implants and grafts, initial encounter: Secondary | ICD-10-CM | POA: Diagnosis not present

## 2024-03-16 MED ORDER — DENOSUMAB-BBDZ 60 MG/ML ~~LOC~~ SOSY: 60.0000 mg | PREFILLED_SYRINGE | Freq: Once | SUBCUTANEOUS | Status: AC

## 2024-03-16 NOTE — Patient Instructions (Signed)
 Please call your primary care provider (family doctor) and your cardiologist (heart doctor) to obtain clearance for your upcoming surgery.

## 2024-03-16 NOTE — Addendum Note (Signed)
 Addended by: Tineshia Becraft on: 03/16/2024 04:20 PM   Modules accepted: Orders

## 2024-03-16 NOTE — Progress Notes (Signed)
 "  Office Visit Note   Patient: Kerry Powell           Date of Birth: 1945-06-11           MRN: [id number] Visit Date: 03/16/2024              Requested by: Geofm Glade PARAS, MD [address] [place],  [name] [address] PCP: Geofm Glade PARAS, MD   Assessment & Plan: Visit Diagnoses:  1. Mechanical complication associated with orthopedic device, initial encounter   2. Preoperative clearance     Plan: Discussed at length the patients condition and treatment options. 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 Discussed hip replacement surgery with patient; after discussing the details of surgery, the risks(which include complications from anesthesia, bleeding, infection, blood clots, leg length discrepancy, nerve damage, blood vessel and ligament damage, risk of loosening or wearing out of implants, dislocation of the prosthesis, and heart attack, stroke, or death), and the benefits, the patient has opted to proceed with hip replacement surgery. Will perform through posterior approach. Advised the patient that they will need clearance from their primary care physician and cardiologist prior to surgery. Will also rule out infection with ESR/CRP. Will plan for surgery 04/01/24.  Orders:  Orders Placed This Encounter  Procedures   Sedimentation rate   C-reactive protein     Subjective: Chief Complaint: Left hip pain  HPI Patient is a 79 y.o. year old female who complains of left hip pain. Onset of the symptoms was August 2025. Inciting event: Patient fell directly onto left hip, fracturing the hip. Had cephalomedullary nail placed in hospital. Since that time has had continued left hip pain. Current symptoms include needing assistance to walk and groin pain. Aggravating symptoms: any weight bearing. Patient's course of pain: gradually worsening.   Objective: Vital Signs: BP 132/86   Pulse (!) 101   Ht 5' 5 (1.651 m)   Wt 171  lb (77.6 kg)   BMI 28.46 kg/m   Physical Exam Gen: Alert, No Acute Distress left hip: Skin intact, no erythema or induration noted. groin tenderness to palpation. Deferred ROM . +2 DP, SILT DP/SP/T, 5/5 EHL/PF/DF  Imaging: Radiographs personally reviewed by me; reveal cephalomedullary nail in place with collapse at fracture site, as well as superior lateral migration of femoral head screw in comparison to original postoperative radiographs of the left hip   PMFS History: Patient Active Problem List   Diagnosis Date Noted   Age-related osteoporosis without current pathological fracture 03/09/2024   Closed right scapular fracture 01/28/2024   Acute hypoxic respiratory failure (HCC) 12/20/2023   History of femur fracture 12/09/2023   Orthostatic hypotension 11/22/2023   Protein-calorie malnutrition, severe 10/31/2023   Recurrent falls 10/21/2023   Closed comminuted intertrochanteric fracture of proximal end of left femur (HCC) 10/21/2023   Chronic respiratory failure with hypoxia (HCC) 10/16/2023   Sterile pyuria 10/07/2023   Hoarseness, chronic 06/05/2023   Hypoxia 03/06/2023   CKD (chronic kidney disease) stage 4, GFR 15-29 ml/min (HCC) 02/11/2023   Chronic diastolic CHF (congestive heart failure) (HCC) 02/11/2023   PAD (peripheral artery disease) 05/15/2022   Pre-syncope 02/01/2022   Peroneal tendinitis 12/04/2021   Pain in joint of left shoulder 11/16/2021   Osteoarthritis of knees, bilateral 05/04/2021   Pain in joint of right knee 05/04/2021   Osteoarthritis of left knee 05/03/2021   B12 deficiency 08/03/2020   Diabetic polyneuropathy associated with  type 2 diabetes mellitus (HCC) 07/29/2020   Diastolic dysfunction without heart failure 01/20/2020   Pain of left thumb 12/24/2019   Pain of right thumb 12/24/2019   Polyarthralgia 12/07/2019   Anemia 12/07/2019   Fatigue 12/07/2019   Acquired thrombophilia 07/14/2019   CAD (coronary artery disease), native coronary artery     Cervical spine pain 06/16/2019   Pleural plaque 04/27/2019   Muscle spasm 11/14/2018   SOB (shortness of breath) 04/17/2018   Vitamin D  deficiency 03/12/2018   Thoracic aortic aneurysm 02/06/2018   Pain in left knee 01/14/2018   Sleep difficulties 09/09/2017   Depression 07/01/2017   Dizziness 06/10/2017   H/O renal cell carcinoma 05/23/2017   History of kidney cancer 05/23/2017   Cervical radiculopathy 10/31/2016   Hyperlipidemia associated with type 2 diabetes mellitus (HCC) 06/25/2016   Occipital neuralgia 05/09/2016   Anxiety 07/26/2015   Chronic neck pain 04/30/2015   Type 2 diabetes mellitus with stage 4 chronic kidney disease, without long-term current use of insulin  (HCC) 04/27/2015   History of CVA (cerebrovascular accident) 04/26/2015   Gout 04/26/2015   Hypertension associated with type 2 diabetes mellitus (HCC) 04/26/2015   Snoring 04/26/2015   Bilateral leg edema 04/26/2015   Dysphagia 04/26/2015   GERD (gastroesophageal reflux disease) 04/26/2015   History of colonic polyps 05/29/2007   HIATAL HERNIA 04/07/2007   RESTLESS LEG SYNDROME 04/04/2007   RHINOSINUSITIS, 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   COPD (chronic obstructive pulmonary disease) (HCC) 04/04/2007   IRRITABLE BOWEL SYNDROME 04/04/2007   Osteoarthritis, diffuse 04/04/2007   Past Medical History:  Diagnosis Date   Allergy    Anemia    Anxiety    Arthritis    BACK PAIN, CHRONIC 04/04/2007   Qualifier: Diagnosis of  By: [name], [name]    BIPOLAR AFFECTIVE DISORDER 04/04/2007   Qualifier: History of  By: [name], [name] E    CAD (coronary artery disease), native coronary artery    mild CAD with no obstructive disease by coronary CTA 06/2019   Cataract    removed both eyes   CHF (congestive heart failure) (HCC)    Chronic neck pain 04/30/2015   Cervical spondylosis [place] neurosurgery Intermittent numbness and tingling in occipital region and headaches S/p 2 occipital nerve blocks - only  temp relief no relief with gabapentin, ultram , brace MRI neck - facet arthrosis    CKD (chronic kidney disease) 04/26/2015   COPD (chronic obstructive pulmonary disease) (HCC)    pt denies - told this while was smoking -   Cough 04/26/2015   Depression 07/01/2017   Diabetes (HCC) 04/27/2015   Diagnosed 03/2015    Dizziness 06/10/2017   Dysphagia 04/26/2015   DYSPNEA 12/24/2007   Qualifier: Diagnosis of  By: [name]     Elevated TSH 08/09/2015   GASTRIC ULCER, ACUTE 04/07/2007   Annotation: EGD 04/07/07 Qualifier: Diagnosis of  By: [name], [name] E    GASTRITIS 04/07/2007   Annotation: EGD 04/07/07 Qualifier: Diagnosis of  By: [name], [name] E    GERD (gastroesophageal reflux disease)    H/O renal cell carcinoma 05/23/2017   Dx 2010, s/p cryoablation Follows with urology - Dr [name]   HIATAL HERNIA 04/07/2007   Annotation: EGD 04/07/07 Qualifier: Diagnosis of  By: [name], [name] E    History of CVA (cerebrovascular accident) 04/26/2015   History of gout 04/26/2015   Hyperlipidemia    Hypertension    Hypoxia 03/06/2023   Irritable bowel  syndrome 04/04/2007   Qualifier: Diagnosis of  By: [name], [name]    Kidney tumor (benign)    Nausea and vomiting 09/09/2017   Occipital neuralgia 05/09/2016   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 Dr [name] in [place]   OSTEOARTHRITIS 04/04/2007   Qualifier: Diagnosis of  By: [name], [name]    Personal history of colonic adenoma 06/07/2012   POSTNASAL DRIP SYNDROME 12/29/2007   Qualifier: Diagnosis of  By: [name], [name]     RESTLESS LEG SYNDROME 04/04/2007   Qualifier: Diagnosis of  By: [name], [name]    RHINOSINUSITIS, 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   Qualifier: Diagnosis of  By: [name], [name]    Sleep difficulties 09/09/2017   Snoring 04/26/2015   Stroke (HCC)    x2 2011   Thoracic aortic aneurysm 02/06/2018   Seen on MRI 01/25/18 of thoracic spine ordered by [facility] -- referred to CTS to monitor  -- 3.5 cm   TIA (transient ischemic attack)    TOBACCO ABUSE 12/17/2007   Qualifier: Diagnosis of  By: [name], [name]     Trigeminal neuralgia of right side of face 02/08/2016   URI (upper respiratory infection) 02/08/2016   Vitamin D  deficiency 03/12/2018    Family History  Problem Relation Age of Onset   Congestive Heart Failure Mother    Cancer Mother        ovarian   Asthma Mother    Heart disease Mother    Heart disease Father    Diabetes Father    Kidney disease Father    Hyperlipidemia Father    Stroke Father    Diabetes Sister    Kidney disease Sister    Miscarriages / Stillbirths Sister    Arthritis Maternal Grandmother    Hyperlipidemia Maternal Grandmother    Hypertension Maternal Grandmother    Obesity Maternal Grandmother    Arthritis Paternal Grandfather    Hypertension Paternal Grandfather    Kidney disease Paternal Grandfather    Diabetes Paternal Aunt    Diabetes Paternal Uncle    Learning disabilities Brother    Anxiety disorder Son    Diabetes Son    Learning disabilities Son    Depression Maternal Aunt    Colon cancer Neg Hx    Colon polyps Neg Hx    Esophageal cancer Neg Hx    Rectal cancer Neg Hx    Stomach cancer Neg Hx     Past Surgical History:  Procedure Laterality Date   ABDOMINAL HYSTERECTOMY     APPENDECTOMY     APPLICATION OF WOUND VAC Left 10/09/2023   Procedure: APPLICATION, WOUND VAC;  Surgeon: [name], MD;  Location: [facility] OR;  Service: Orthopedics;  Laterality: Left;   CATARACT EXTRACTION     COLONOSCOPY     CYSTOCELE REPAIR     ESOPHAGOGASTRODUODENOSCOPY     FRACTURE SURGERY  2007   INCISION AND DRAINAGE OF DEEP ABSCESS, CALF Left 10/09/2023   Procedure: INCISION AND DRAINAGE OF DEEP ABSCESS, CALF;  Surgeon: [name], MD;  Location: [facility] OR;  Service: Orthopedics;  Laterality: Left;   INTRAMEDULLARY (IM) NAIL INTERTROCHANTERIC Left 10/24/2023   Procedure: FIXATION, FRACTURE,  INTERTROCHANTERIC, WITH INTRAMEDULLARY ROD;  Surgeon: [name], MD;  Location: [facility] OR;  Service: Orthopedics;  Laterality: Left;   KIDNEY SURGERY     benign tumor removal   ORTHOPEDIC SURGERY     Wrist & elbow   POLYPECTOMY  RECTOCELE REPAIR     TONSILECTOMY, ADENOIDECTOMY, BILATERAL MYRINGOTOMY AND TUBES Bilateral 1968   UPPER GASTROINTESTINAL ENDOSCOPY     Social History   Occupational History   Occupation: Retired  Tobacco Use   Smoking status: Former    Current packs/day: 0.00    Average packs/day: 1 pack/day for 46.0 years (46.0 ttl pk-yrs)    Types: Cigarettes    Start date: 07/01/1963    Quit date: 06/30/2009    Years since quitting: 14.7    Passive exposure: Past   Smokeless tobacco: Never  Vaping Use   Vaping status: Never Used  Substance and Sexual Activity   Alcohol  use: No   Drug use: No   Sexual activity: Not Currently   Current Outpatient Medications  Medication Instructions   albuterol  (PROVENTIL ) (2.5 MG/3ML) 0.083% nebulizer solution USE 1 VIAL IN NEBULIZER EVERY 6 HOURS AND AS NEEDED FOR SHORT BREATH/WHEEZE.  Generic:  Ventolin    allopurinol  (ZYLOPRIM ) 300 mg, Oral, Daily   Blood Glucose Monitoring Suppl (ONETOUCH VERIO FLEX SYSTEM) w/Device KIT 1 kit, Does not apply, As directed, Dx codes: E11.22, N18.4 Type 2 diabetes with stage 4 chronic kidney disease without long term insulin    Blood Glucose Monitoring Suppl DEVI May substitute to any manufacturer covered by patient's insurance. Patient request Accu-check meter and testing suppliles   cholecalciferol (VITAMIN D3) 2,000 Units, Daily   clopidogrel  (PLAVIX ) 75 mg, Oral, Daily   dapagliflozin  propanediol (FARXIGA ) 10 mg, Oral, Daily   DULoxetine  (CYMBALTA ) 60 mg, Oral, Daily   ferrous sulfate  325 mg, Every other day   fluticasone  (FLONASE ) 50 MCG/ACT nasal spray 2 sprays, Each Nare, Daily   Glucose Blood (BLOOD GLUCOSE TEST STRIPS) STRP May substitute to any manufacturer covered by patient's  insurance. Patient to test glucose 2-3 times daily as needed   glucose blood test strip Use as instructed   Lancet Device MISC May substitute to any manufacturer covered by patient's insurance.  Patient to test glucose 2-3 times daily as needed   Lancets (ONETOUCH ULTRASOFT) lancets Use as instructed   levocetirizine (XYZAL ) 5 MG tablet SMARTSIG:1 Tablet(s) By Mouth Every Evening   metoprolol  succinate (TOPROL -XL) 25 mg, Oral, Daily   midodrine  (PROAMATINE ) 5 mg, Oral, 3 times daily with meals   montelukast  (SINGULAIR ) 10 mg, Oral, Daily at bedtime   MOUNJARO  5 MG/0.5ML Pen INJECT 1/2 (ONE-HALF) ML SUBCUTANEOUSLY  ONCE A WEEK   pantoprazole  (PROTONIX ) 40 mg, Oral, 2 times daily before meals   polyethylene glycol (MIRALAX  / GLYCOLAX ) 17 g, Oral, 2 times daily   rosuvastatin  (CRESTOR ) 10 mg, Oral, Daily   sodium chloride  (OCEAN) 0.65 % SOLN nasal spray 1 spray, Each Nare, As needed   tiZANidine  (ZANAFLEX ) 2 mg, Oral, Every 6 hours PRN   Allergies as of 03/16/2024 - Review Complete 03/12/2024  Allergen Reaction Noted   Gabapentin Other (See Comments) 02/13/2016   Spiriva  respimat [tiotropium bromide ] Other (See Comments) 01/29/2024   Symbicort  [budesonide -formoterol  fumarate] Other (See Comments) 03/13/2024   Clindamycin  Rash 04/26/2015   Sulfa antibiotics Nausea And Vomiting 02/18/2011   "

## 2024-03-16 NOTE — Telephone Encounter (Signed)
 Pt called stating please send clearance sheets for surgery to her Cardiologist Randine Bihari and her PCP Dr Glade Hope. Pt number is [phone].

## 2024-03-17 ENCOUNTER — Encounter: Payer: Self-pay | Admitting: Internal Medicine

## 2024-03-17 ENCOUNTER — Telehealth: Payer: Self-pay

## 2024-03-17 LAB — SEDIMENTATION RATE: Sed Rate: 24 mm/h (ref 0–40)

## 2024-03-17 LAB — C-REACTIVE PROTEIN: CRP: <1 mg/L (ref 0–10)

## 2024-03-17 NOTE — Telephone Encounter (Signed)
 Copied from CRM #[id number]. Topic: General - Other >> Mar 16, 2024  4:41 PM Viola F wrote: Patient needs Dr. Geofm to fax surgery clearance for hip surgery with Dr. Thamas Ike to [phone]. She asked for a call to let her know if she needs to come in the office? She was last seen 03/13/24. >> Mar 16, 2024  4:45 PM Roselie BROCKS wrote: Patient returned call to let pcp know the fax number she provided earlier is not correct, but the patient does not have the correct fax number.

## 2024-03-17 NOTE — Telephone Encounter (Signed)
 My chart sent to patient to address

## 2024-03-18 ENCOUNTER — Encounter: Payer: Self-pay | Admitting: Internal Medicine

## 2024-03-18 DIAGNOSIS — J9611 Chronic respiratory failure with hypoxia: Secondary | ICD-10-CM

## 2024-03-18 DIAGNOSIS — M170 Bilateral primary osteoarthritis of knee: Secondary | ICD-10-CM

## 2024-03-18 DIAGNOSIS — E1142 Type 2 diabetes mellitus with diabetic polyneuropathy: Secondary | ICD-10-CM

## 2024-03-18 DIAGNOSIS — S72142D Displaced intertrochanteric fracture of left femur, subsequent encounter for closed fracture with routine healing: Secondary | ICD-10-CM

## 2024-03-19 ENCOUNTER — Telehealth (HOSPITAL_BASED_OUTPATIENT_CLINIC_OR_DEPARTMENT_OTHER): Payer: Self-pay

## 2024-03-19 ENCOUNTER — Telehealth: Payer: Self-pay

## 2024-03-19 ENCOUNTER — Telehealth (HOSPITAL_BASED_OUTPATIENT_CLINIC_OR_DEPARTMENT_OTHER): Payer: Self-pay | Admitting: *Deleted

## 2024-03-19 NOTE — Telephone Encounter (Signed)
 Noted.

## 2024-03-19 NOTE — Telephone Encounter (Signed)
 Pt has been scheduled tele preop appt 03/25/24. Med rec and consent are done.   Pt tells me she just got a surgery date yesterday for 04/01/24. I thanked her for the update as I do not see that the surgeon office gave an update.

## 2024-03-19 NOTE — Telephone Encounter (Signed)
 Copied from CRM #[id number]. Topic: General - Other >> Mar 18, 2024  4:54 PM Jasmin G wrote: Reason for CRM: Nurse from [facility] called to inform Dr. Geofm team that she will her PT on hold until after pt's surgery on Feb.

## 2024-03-19 NOTE — Telephone Encounter (Signed)
"  ° °  Pre-operative Risk Assessment    Patient Name: Kerry Powell  DOB: 04-Aug-1945 MRN: [id number]   Date of last office visit: 11/26/23 with Parthenia  Date of next office visit: NA  Request for Surgical Clearance    Procedure:  Conversion to left total hip arthroplasty   Date of Surgery:  Clearance TBD                                 Surgeon:  Dr. Mariah Surgeon's Group or Practice Name:  [facility]  Phone number:  [phone] Fax number:  [phone]   Type of Clearance Requested:   - Medical  - Pharmacy:  Hold Clopidogrel  (Plavix ) not indicated   Type of Anesthesia:  not indicated   Additional requests/questions:    Bonney Augustin JONETTA Delores   03/19/2024, 9:54 AM   "

## 2024-03-19 NOTE — Telephone Encounter (Signed)
" ° °  Name: Kerry Powell  DOB: 1945/07/24  MRN: [id number]  Primary Cardiologist: Wilbert Bihari, MD   Preoperative team, please contact this patient and set up a phone call appointment for further preoperative risk assessment. Please obtain consent and complete medication review. Thank you for your help.  I confirm that guidance regarding antiplatelet and oral anticoagulation therapy has been completed and, if necessary, noted below.  Plavix  prescribed by a noncardiology provider therefore recommendations for holding deferred to prescribing provider.   I also confirmed the patient resides in the state of [place] . As per [employer] Medical Board telemedicine laws, the patient must reside in the state in which the provider is licensed.   [name], FNP 03/19/2024, 11:12 AM [facility]    "

## 2024-03-19 NOTE — Telephone Encounter (Signed)
 Left message for to call and schedule tele preop appt.

## 2024-03-19 NOTE — Telephone Encounter (Signed)
 Pt returning call to schedule Tele Visit. Please advise

## 2024-03-19 NOTE — Telephone Encounter (Signed)
 Pt has been scheduled tele preop appt 03/25/24. Med rec and consent are done.   Pt tells me she just got a surgery date yesterday for 04/01/24. I thanked her for the update as I do not see that the surgeon office gave an update.     Patient Consent for Virtual Visit        Kerry Powell has provided verbal consent on 03/19/2024 for a virtual visit (video or telephone).   CONSENT FOR VIRTUAL VISIT FOR:  Kerry Powell  By participating in this virtual visit I agree to the following:  I hereby voluntarily request, consent and authorize [facility] and its employed or contracted physicians, physician assistants, nurse practitioners or other licensed health care professionals (the Practitioner), to provide me with telemedicine health care services (the Services) as deemed necessary by the treating Practitioner. I acknowledge and consent to receive the Services by the Practitioner via telemedicine. I understand that the telemedicine visit will involve communicating with the Practitioner through live audiovisual communication technology and the disclosure of certain medical information by electronic transmission. I acknowledge that I have been given the opportunity to request an in-person assessment or other available alternative prior to the telemedicine visit and am voluntarily participating in the telemedicine visit.  I understand that I have the right to withhold or withdraw my consent to the use of telemedicine in the course of my care at any time, without affecting my right to future care or treatment, and that the Practitioner or I may terminate the telemedicine visit at any time. I understand that I have the right to inspect all information obtained and/or recorded in the course of the telemedicine visit and may receive copies of available information for a reasonable fee.  I understand that some of the potential risks of receiving the Services via telemedicine include:  Delay or  interruption in medical evaluation due to technological equipment failure or disruption; Information transmitted may not be sufficient (e.g. poor resolution of images) to allow for appropriate medical decision making by the Practitioner; and/or  In rare instances, security protocols could fail, causing a breach of personal health information.  Furthermore, I acknowledge that it is my responsibility to provide information about my medical history, conditions and care that is complete and accurate to the best of my ability. I acknowledge that Practitioner's advice, recommendations, and/or decision may be based on factors not within their control, such as incomplete or inaccurate data provided by me or distortions of diagnostic images or specimens that may result from electronic transmissions. I understand that the practice of medicine is not an exact science and that Practitioner makes no warranties or guarantees regarding treatment outcomes. I acknowledge that a copy of this consent can be made available to me via my patient portal [facility] MyChart), or I can request a printed copy by calling the office of [facility].    I understand that my insurance will be billed for this visit.   I have read or had this consent read to me. I understand the contents of this consent, which adequately explains the benefits and risks of the Services being provided via telemedicine.  I have been provided ample opportunity to ask questions regarding this consent and the Services and have had my questions answered to my satisfaction. I give my informed consent for the services to be provided through the use of telemedicine in my medical care

## 2024-03-24 ENCOUNTER — Other Ambulatory Visit: Payer: Self-pay | Admitting: Internal Medicine

## 2024-03-24 ENCOUNTER — Ambulatory Visit: Payer: Self-pay

## 2024-03-24 MED ORDER — HYDROCODONE-ACETAMINOPHEN 5-325 MG PO TABS: 1.0000 | ORAL_TABLET | Freq: Three times a day (TID) | ORAL | 0 refills | Status: DC | PRN

## 2024-03-24 MED ORDER — HYDROCODONE-ACETAMINOPHEN 5-325 MG PO TABS: 1.0000 | ORAL_TABLET | Freq: Three times a day (TID) | ORAL | 0 refills | Status: AC | PRN

## 2024-03-24 MED ORDER — METOPROLOL SUCCINATE ER 25 MG PO TB24: 12.5000 mg | ORAL_TABLET | Freq: Every day | ORAL | Status: AC

## 2024-03-24 NOTE — Telephone Encounter (Signed)
 Hydrocodone  sent to pharmacy.  ?  Is she currently taking anything for her BP-if not-  Restart metoprolol  XL-1/2 pill daily.  Monitor BP and if BP goes too low or too high she needs to call us .

## 2024-03-24 NOTE — Telephone Encounter (Signed)
 FYI Only or Action Required?: Action required by provider: clinical question for provider.  Patient was last seen in primary care on 03/13/2024 by Geofm Glade PARAS, MD.  Called Nurse Triage reporting Hypertension.  Interventions attempted: OTC medications: metoprolol .  Symptoms are: gradually worsening.  Triage Disposition: See PCP When Office is Open (Within 3 Days)  Patient/caregiver understands and will follow disposition?: Yes                         Message from [facility] sent at 03/24/2024 11:37 AM EST  Reason for Triage: High BP 164/100.   Reason for Disposition  Systolic BP >= 160 OR Diastolic >= 100  Answer Assessment - Initial Assessment Questions 1. BLOOD PRESSURE: What is your blood pressure? Did you take at least two measurements 5 minutes apart?     122/80 standing; sitting 164/100 pulse 60.  2. ONSET: When did you take your blood pressure?     Today 10 minutes prior to call.  3. HOW: How did you take your blood pressure? (e.g., automatic home BP monitor, visiting nurse)     Manual BP, visiting RN.  4. HISTORY: Do you have a history of high blood pressure?     Yes.  5. MEDICINES: Are you taking any medicines for blood pressure? Have you missed any doses recently?     Yes, metoprolol . No missed doses. Amlodipine  and midodrine  discontinued last week by nephrology; attempted call to them today and no answer  6. OTHER SYMPTOMS: Do you have any symptoms? (e.g., blurred vision, chest pain, difficulty breathing, headache, weakness)     10/10 left hip pain, slight headache x 3 days. No chest pain, SOB, unilateral numbness or weakness, changes in speech or vision  Protocols used: Blood Pressure - High-A-AH

## 2024-03-25 ENCOUNTER — Encounter: Payer: Self-pay | Admitting: Physician Assistant

## 2024-03-25 ENCOUNTER — Ambulatory Visit: Admitting: Physician Assistant

## 2024-03-25 DIAGNOSIS — Z01810 Encounter for preprocedural cardiovascular examination: Secondary | ICD-10-CM | POA: Diagnosis not present

## 2024-03-25 NOTE — H&P (View-Only) (Signed)
 "   Virtual Visit via Telephone Note for Surgical Clearance    Because of [name] co-morbid illnesses, she is at least at moderate risk for complications without adequate follow up.  This format is felt to be most appropriate for this patient at this time.  Due to technical limitations with video connection web designer), today's appointment will be conducted as an audio only telehealth visit, and [name] verbally agreed to proceed in this manner.   All issues noted in this document were discussed and addressed.  No physical exam could be performed with this format.  Evaluation Performed:  Preoperative cardiovascular risk assessment _____________   Date:  03/25/2024   Patient ID:  Kerry Powell, DOB [birth year], MRN [id number]  Patient Location:  Provider location:  Home Office   Primary Care Provider:  Geofm Glade PARAS, MD Primary Cardiologist:  Wilbert Bihari, MD  Patient Profile  Coronary artery disease CCTA 07/02/2019: CAC score 390 (84th percentile), nonobstructive CAD (RCA 25-49, LM 25-49, LAD 0-24, LCx 0-24) Nuclear stress test 10/23/2023: Possible inferolateral ischemia, intermediate risk Pt seen for surgical clearance 09/2023 (Fall >> L hip fx) - Pt was cleared for surgery due to lack of high risk findings and underwent IM nailing (HFpEF) heart failure with preserved ejection fraction  TTE 10/22/2023: EF 60-65, mild LVH Thoracic aortic aneurysm CT 11/2023: Ascending thoracic aorta 3.9 cm Hx of CVA Atrial tachycardia  Peripheral arterial disease  Aortic atherosclerosis CT 12/04/2023: Severe thoracic and abdominal aortic atherosclerosis, 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 female who presents via audio/video conferencing for a telehealth visit today.   Pt was last seen in cardiology clinic on 11/26/23 by Kerry Pavy, PA-C.  At that time Kerry Powell was doing well.    Pending Surgery: L Hip Replacement  Type of anesthesia: unknown Medications to be held: Clopidogrel  (managed by PCP)  Since her last visit, she was admitted in October with COPD exacerbation and acute kidney injury in the setting of UTI.  SGLT2 inhibitor was held.  However, in follow-up with primary care in November, she had volume excess and Farxiga  was resumed.  This was ultimately discontinued again secondary to dysuria.  She had a couple falls in November and December 2025.  She went to the emergency room.  She had a right scapular fracture noted.  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  Total hip arthroplasty is recommended.  She is very limited by her hip pain. She is mainly confined to a wheelchair. She is not able to achieve 4 METs. She has not had chest pain, shortness of breath, syncope. She has had BP med changes recently and her BP has been increased. She sees her nephrologist on Friday for further management.    Physical Exam  Vital Signs:  ISELLA SLATTEN does not have vital signs available for review today. Given telephonic nature of communication, physical exam is limited. AAOx3. NAD. Normal affect.  Speech and respirations are unlabored.  Recent Labs    01/27/24 1906  CREATININE 1.31*    Assessment & Plan   Assessment &  Plan Preoperative cardiovascular examination She is high risk for perioperative CV complications. She is quite limited due to her hip pain. She cannot achieve 4 METs. According to ACC/AHA guidelines, she would need stress testing to proceed. However, she had a nuclear stress test in the hospital in August prior to her original hip  surgery. She has not had any unstable symptoms since last seen.  - I will review further with her cardiologist (Dr. Shlomo) to determine if we need to pursue further testing prior to her planned procedure. - Further recommendations to follow.   ADDENDUM: Discussed patient's case with Dr. Shlomo. She reviewed the patient's prior nuclear stress study from August 2025. This study was of poor quality and possibly shows some inferolateral ischemia. The study had no high risk features and she went for initial hip surgery after this without CV complications. Her CT in 2021 only showed mild nonobstructive disease. She is not able to ambulate because of her hip and needs to have the surgery for quality of life. She has not had any chest pain or shortness of breath. Overall, we feel she is likely moderate risk and can proceed with her planned procedure without further testing.   Of note, her Clopidogrel  is managed by primary care for a history of stroke and aortic atherosclerosis. From a cardiac standpoint, she has no contraindications to holding Clopidogrel . However, recommendations for holding it will need to come from primary care.   A copy of this note will be routed to requesting surgeon.  Time:   Today, I have spent 14 minutes with the patient with telehealth technology discussing medical history, symptoms, and management plan.     Glendia Ferrier, PA-C 03/25/2024, 4:51 PM  "

## 2024-03-25 NOTE — Progress Notes (Signed)
 "   Virtual Visit via Telephone Note for Surgical Clearance    Because of [name] co-morbid illnesses, she is at least at moderate risk for complications without adequate follow up.  This format is felt to be most appropriate for this patient at this time.  Due to technical limitations with video connection web designer), today's appointment will be conducted as an audio only telehealth visit, and [name] verbally agreed to proceed in this manner.   All issues noted in this document were discussed and addressed.  No physical exam could be performed with this format.  Evaluation Performed:  Preoperative cardiovascular risk assessment _____________   Date:  03/25/2024   Patient ID:  Kerry Powell, DOB [birth year], MRN [id number]  Patient Location:  Provider location:  Home Office   Primary Care Provider:  Geofm Glade PARAS, MD Primary Cardiologist:  Wilbert Bihari, MD  Patient Profile  Coronary artery disease CCTA 07/02/2019: CAC score 390 (84th percentile), nonobstructive CAD (RCA 25-49, LM 25-49, LAD 0-24, LCx 0-24) Nuclear stress test 10/23/2023: Possible inferolateral ischemia, intermediate risk Pt seen for surgical clearance 09/2023 (Fall >> L hip fx) - Pt was cleared for surgery due to lack of high risk findings and underwent IM nailing (HFpEF) heart failure with preserved ejection fraction  TTE 10/22/2023: EF 60-65, mild LVH Thoracic aortic aneurysm CT 11/2023: Ascending thoracic aorta 3.9 cm Hx of CVA Atrial tachycardia  Peripheral arterial disease  Aortic atherosclerosis CT 12/04/2023: Severe thoracic and abdominal aortic atherosclerosis, 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 female who presents via audio/video conferencing for a telehealth visit today.   Pt was last seen in cardiology clinic on 11/26/23 by [name], PA-C.  At that time Kerry Powell was doing well.    Pending Surgery: L Hip Replacement  Type of anesthesia: unknown Medications to be held: Clopidogrel  (managed by PCP)  Since her last visit, she was admitted in October with COPD exacerbation and acute kidney injury in the setting of UTI.  SGLT2 inhibitor was held.  However, in follow-up with primary care in November, she had volume excess and Farxiga  was resumed.  This was ultimately discontinued again secondary to dysuria.  She had a couple falls in November and December 2025.  She went to the emergency room.  She had a right scapular fracture noted.  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  Total hip arthroplasty is recommended.  She is very limited by her hip pain. She is mainly confined to a wheelchair. She is not able to achieve 4 METs. She has not had chest pain, shortness of breath, syncope. She has had BP med changes recently and her BP has been increased. She sees her nephrologist on Friday for further management.    Physical Exam  Vital Signs:  ISELLA SLATTEN does not have vital signs available for review today. Given telephonic nature of communication, physical exam is limited. AAOx3. NAD. Normal affect.  Speech and respirations are unlabored.  Recent Labs    01/27/24 1906  CREATININE 1.31*    Assessment & Plan   Assessment &  Plan Preoperative cardiovascular examination She is high risk for perioperative CV complications. She is quite limited due to her hip pain. She cannot achieve 4 METs. According to ACC/AHA guidelines, she would need stress testing to proceed. However, she had a nuclear stress test in the hospital in August prior to her original hip  surgery. She has not had any unstable symptoms since last seen.  - I will review further with her cardiologist (Dr. Shlomo) to determine if we need to pursue further testing prior to her planned procedure. - Further recommendations to follow.   ADDENDUM: Discussed patient's case with Dr. Shlomo. She reviewed the patient's prior nuclear stress study from August 2025. This study was of poor quality and possibly shows some inferolateral ischemia. The study had no high risk features and she went for initial hip surgery after this without CV complications. Her CT in 2021 only showed mild nonobstructive disease. She is not able to ambulate because of her hip and needs to have the surgery for quality of life. She has not had any chest pain or shortness of breath. Overall, we feel she is likely moderate risk and can proceed with her planned procedure without further testing.   Of note, her Clopidogrel  is managed by primary care for a history of stroke and aortic atherosclerosis. From a cardiac standpoint, she has no contraindications to holding Clopidogrel . However, recommendations for holding it will need to come from primary care.   A copy of this note will be routed to requesting surgeon.  Time:   Today, I have spent 14 minutes with the patient with telehealth technology discussing medical history, symptoms, and management plan.     Glendia Ferrier, PA-C 03/25/2024, 4:51 PM  "

## 2024-03-26 ENCOUNTER — Encounter
Admission: RE | Admit: 2024-03-26 | Discharge: 2024-03-26 | Disposition: A | Discharge status: Home / Self Care | Source: Ambulatory Visit

## 2024-03-26 ENCOUNTER — Other Ambulatory Visit: Payer: Self-pay | Admitting: Internal Medicine

## 2024-03-26 ENCOUNTER — Other Ambulatory Visit: Payer: Self-pay

## 2024-03-26 VITALS — HR 93 | Resp 17 | Wt 172.2 lb

## 2024-03-26 DIAGNOSIS — Z01810 Encounter for preprocedural cardiovascular examination: Secondary | ICD-10-CM

## 2024-03-26 DIAGNOSIS — E119 Type 2 diabetes mellitus without complications: Secondary | ICD-10-CM

## 2024-03-26 DIAGNOSIS — E1169 Type 2 diabetes mellitus with other specified complication: Secondary | ICD-10-CM

## 2024-03-26 DIAGNOSIS — I10 Essential (primary) hypertension: Secondary | ICD-10-CM

## 2024-03-26 DIAGNOSIS — Z01812 Encounter for preprocedural laboratory examination: Secondary | ICD-10-CM

## 2024-03-26 HISTORY — DX: Cataract extraction status, right eye: Z98.41

## 2024-03-26 HISTORY — DX: Emphysema, unspecified: J43.9

## 2024-03-26 HISTORY — DX: Cataract extraction status, left eye: Z98.42

## 2024-03-26 HISTORY — DX: Atherosclerosis of aorta: I70.0

## 2024-03-26 HISTORY — DX: Malignant (primary) neoplasm, unspecified: C80.1

## 2024-03-26 HISTORY — DX: Displaced fracture of body of scapula, right shoulder, initial encounter for closed fracture: S42.111A

## 2024-03-26 LAB — URINALYSIS, COMPLETE (UACMP) WITH MICROSCOPIC
Bilirubin Urine: NEGATIVE
Glucose, UA: NEGATIVE mg/dL
Ketones, ur: NEGATIVE mg/dL
Nitrite: NEGATIVE
Protein, ur: 30 mg/dL — AB
Specific Gravity, Urine: 1.011 (ref 1.005–1.030)
WBC, UA: >50 WBC/hpf (ref 0–5)
pH: 5 (ref 5.0–8.0)

## 2024-03-26 LAB — TYPE AND SCREEN
ABO/RH(D): A POS
Antibody Screen: NEGATIVE

## 2024-03-26 LAB — SURGICAL PCR SCREEN
MRSA, PCR: NEGATIVE
Staphylococcus aureus: NEGATIVE

## 2024-03-26 NOTE — Patient Instructions (Addendum)
 Your procedure is scheduled on: 04/01/24 - Wednesday Report to the Registration Desk on the 1st floor of the [facility]- [address] [name] [address] To find out your arrival time, please call [phone] between 1PM - 3PM on: 03/31/24 - Tuesday If your arrival time is 6:00 am, do not arrive before that time as the [facility] entrance doors do not open until 6:00 am.  REMEMBER: Instructions that are not followed completely may result in serious medical risk, up to and including death; or upon the discretion of your surgeon and anesthesiologist your surgery may need to be rescheduled.  Do not eat food after midnight the night before surgery.  No gum chewing or hard candies.  You may however, drink CLEAR liquids up to 2 hours before you are scheduled to arrive for your surgery. Do not drink anything within 2 hours of your scheduled arrival time.  Clear liquids include: - water   In addition, your doctor has ordered for you to drink the provided:  Gatorade G2 Drinking this carbohydrate drink up to two hours before surgery helps to reduce insulin  resistance and improve patient outcomes. Please complete drinking 2 hours before scheduled arrival time.  One week prior to surgery: Stop Anti-inflammatories (NSAIDS) such as Advil, Aleve, Ibuprofen, Motrin, Naproxen, Naprosyn and Aspirin  based products such as Excedrin, Goody's Powder, BC Powder. You may take Tylenol  and HYDROcodone  if needed for pain up until the day of surgery.  Stop ANY OVER THE COUNTER supplements until after surgery - VITAMIN D3   MOUNJARO  hold 7 days prior to surgery. Hold on 02/01 , may resume after surgery  clopidogrel  (PLAVIX ) - follow instructions given by your primary care doctor.  ON THE DAY OF SURGERY ONLY TAKE THESE MEDICATIONS WITH SIPS OF WATER:  DULoxetine  (CYMBALTA )  metoprolol  succinate  albuterol  (PROVENTIL ) neb  Use inhalers on the day of surgery and bring to the hospital.  No Alcohol   for 24 hours before or after surgery.  No Smoking including e-cigarettes for 24 hours before surgery.  No chewable tobacco products for at least 6 hours before surgery.  No nicotine patches on the day of surgery.  Do not use any recreational drugs for at least a week (preferably 2 weeks) before your surgery.  Please be advised that the combination of cocaine and anesthesia may have negative outcomes, up to and including death. If you test positive for cocaine, your surgery will be cancelled.  On the morning of surgery brush your teeth with toothpaste and water, you may rinse your mouth with mouthwash if you wish. Do not swallow any toothpaste or mouthwash.  Use CHG Soap or wipes as directed on instruction sheet.  Do not wear jewelry, make-up, hairpins, clips or nail polish.  For welded (permanent) jewelry: bracelets, anklets, waist bands, etc.  Please have this removed prior to surgery.  If it is not removed, there is a chance that hospital personnel will need to cut it off on the day of surgery.  Do not wear lotions, powders, or perfumes.   Do not shave body hair from the neck down 48 hours before surgery.  Contact lenses, hearing aids and dentures may not be worn into surgery.  Do not bring valuables to the hospital. [facility] is not responsible for any missing/lost belongings or valuables.   Notify your doctor if there is any change in your medical condition (cold, fever, infection).  Wear comfortable clothing (specific to your surgery type) to the hospital.  After  surgery, you can help prevent lung complications by doing breathing exercises.  Take deep breaths and cough every 1-2 hours. Your doctor may order a device called an Incentive Spirometer to help you take deep breaths.  If you are being admitted to the hospital overnight, leave your suitcase in the car. After surgery it may be brought to your room.  In case of increased patient census, it may be necessary for you,  the patient, to continue your postoperative care in the Same Day Surgery department.  If you are being discharged the day of surgery, you will not be allowed to drive home. You will need a responsible individual to drive you home and stay with you for 24 hours after surgery.   If you are taking public transportation, you will need to have a responsible individual with you.  Please call the Pre-admissions Testing Dept. at [phone] if you have any questions about these instructions.  Surgery Visitation Policy:  Patients having surgery or a procedure may have two visitors.  Children under the age of 79 must have an adult with them who is not the patient.  Inpatient Visitation:    Visiting hours are 7 a.m. to 8 p.m. Up to four visitors are allowed at one time in a patient room. The visitors may rotate out with other people during the day.  One visitor age 79 or older may stay with the patient overnight and must be in the room by 8 p.m.   Merchandiser, Retail to address health-related social needs:  [email or web address]   Pre-operative 4 CHG Bath Instructions   You can play a key role in reducing the risk of infection after surgery. Your skin needs to be as free of germs as possible. You can reduce the number of germs on your skin by washing with CHG (chlorhexidine  gluconate) soap before surgery. CHG is an antiseptic soap that kills germs and continues to kill germs even after washing.   DO NOT use if you have an allergy to chlorhexidine /CHG or antibacterial soaps. If your skin becomes reddened or irritated, stop using the CHG and notify one of our RNs at [phone].   Please shower with the CHG soap starting 4 days before surgery using the following schedule: 01/31 - 02/03.    Please keep in mind the following:  DO NOT shave, including legs and underarms, starting the day of your first shower.   You may shave your face at any point before/day of surgery.   Place clean sheets on your bed the day you start using CHG soap. Use a clean washcloth (not used since being washed) for each shower. DO NOT sleep with pets once you start using the CHG.   CHG Shower Instructions:  If you choose to wash your hair and private area, wash first with your normal shampoo/soap.  After you use shampoo/soap, rinse your hair and body thoroughly to remove shampoo/soap residue.  Turn the water OFF and apply about 3 tablespoons (45 ml) of CHG soap to a CLEAN washcloth.  Apply CHG soap ONLY FROM YOUR NECK DOWN TO YOUR TOES (washing for 3-5 minutes)  DO NOT use CHG soap on face, private areas, open wounds, or sores.  Pay special attention to the area where your surgery is being performed.  If you are having back surgery, having someone wash your back for you may be helpful. Wait 2 minutes after CHG soap is applied, then you may rinse off the CHG soap.  Pat dry with  a clean towel  Put on clean clothes/pajamas   If you choose to wear lotion, please use ONLY the CHG-compatible lotions on the back of this paper.     Additional instructions for the day of surgery: DO NOT APPLY any lotions, deodorants, cologne, or perfumes.   Put on clean/comfortable clothes.  Brush your teeth.  Ask your nurse before applying any prescription medications to the skin.      CHG Compatible Lotions   Aveeno Moisturizing lotion  Cetaphil Moisturizing Cream  Cetaphil Moisturizing Lotion  Clairol Herbal Essence Moisturizing Lotion, Dry Skin  Clairol Herbal Essence Moisturizing Lotion, Extra Dry Skin  Clairol Herbal Essence Moisturizing Lotion, Normal Skin  Curel Age Defying Therapeutic Moisturizing Lotion with Alpha Hydroxy  Curel Extreme Care Body Lotion  Curel Soothing Hands Moisturizing Hand Lotion  Curel Therapeutic Moisturizing Cream, Fragrance-Free  Curel Therapeutic Moisturizing Lotion, Fragrance-Free  Curel Therapeutic Moisturizing Lotion, Original Formula  Eucerin Daily  Replenishing Lotion  Eucerin Dry Skin Therapy Plus Alpha Hydroxy Crme  Eucerin Dry Skin Therapy Plus Alpha Hydroxy Lotion  Eucerin Original Crme  Eucerin Original Lotion  Eucerin Plus Crme Eucerin Plus Lotion  Eucerin TriLipid Replenishing Lotion  Keri Anti-Bacterial Hand Lotion  Keri Deep Conditioning Original Lotion Dry Skin Formula Softly Scented  Keri Deep Conditioning Original Lotion, Fragrance Free Sensitive Skin Formula  Keri Lotion Fast Absorbing Fragrance Free Sensitive Skin Formula  Keri Lotion Fast Absorbing Softly Scented Dry Skin Formula  Keri Original Lotion  Keri Skin Renewal Lotion Keri Silky Smooth Lotion  Keri Silky Smooth Sensitive Skin Lotion  Nivea Body Creamy Conditioning Oil  Nivea Body Extra Enriched Lotion  Nivea Body Original Lotion  Nivea Body Sheer Moisturizing Lotion Nivea Crme  Nivea Skin Firming Lotion  NutraDerm 30 Skin Lotion  NutraDerm Skin Lotion  NutraDerm Therapeutic Skin Cream  NutraDerm Therapeutic Skin Lotion  ProShield Protective Hand Cream  Provon moisturizing lotion  How to Use an Incentive Spirometer  An incentive spirometer is a tool that measures how well you are filling your lungs with each breath. Learning to take long, deep breaths using this tool can help you keep your lungs clear and active. This may help to reverse or lessen your chance of developing breathing (pulmonary) problems, especially infection. You may be asked to use a spirometer: After a surgery. If you have a lung problem or a history of smoking. After a long period of time when you have been unable to move or be active. If the spirometer includes an indicator to show the highest number that you have reached, your health care provider or respiratory therapist will help you set a goal. Keep a log of your progress as told by your health care provider. What are the risks? Breathing too quickly may cause dizziness or cause you to pass out. Take your time so you do not  get dizzy or light-headed. If you are in pain, you may need to take pain medicine before doing incentive spirometry. It is harder to take a deep breath if you are having pain. How to use your incentive spirometer  Sit up on the edge of your bed or on a chair. Hold the incentive spirometer so that it is in an upright position. Before you use the spirometer, breathe out normally. Place the mouthpiece in your mouth. Make sure your lips are closed tightly around it. Breathe in slowly and as deeply as you can through your mouth, causing the piston or the ball to rise toward the top  of the chamber. Hold your breath for 3-5 seconds, or for as long as possible. If the spirometer includes a coach indicator, use this to guide you in breathing. Slow down your breathing if the indicator goes above the marked areas. Remove the mouthpiece from your mouth and breathe out normally. The piston or ball will return to the bottom of the chamber. Rest for a few seconds, then repeat the steps 10 or more times. Take your time and take a few normal breaths between deep breaths so that you do not get dizzy or light-headed. Do this every 1-2 hours when you are awake. If the spirometer includes a goal marker to show the highest number you have reached (best effort), use this as a goal to work toward during each repetition. After each set of 10 deep breaths, cough a few times. This will help to make sure that your lungs are clear. If you have an incision on your chest or abdomen from surgery, place a pillow or a rolled-up towel firmly against the incision when you cough. This can help to reduce pain while taking deep breaths and coughing. General tips When you are able to get out of bed: Walk around often. Continue to take deep breaths and cough in order to clear your lungs. Keep using the incentive spirometer until your health care provider says it is okay to stop using it. If you have been in the hospital, you may be  told to keep using the spirometer at home. Contact a health care provider if: You are having difficulty using the spirometer. You have trouble using the spirometer as often as instructed. Your pain medicine is not giving enough relief for you to use the spirometer as told. You have a fever. Get help right away if: You develop shortness of breath. You develop a cough with bloody mucus from the lungs. You have fluid or blood coming from an incision site after you cough. Summary An incentive spirometer is a tool that can help you learn to take long, deep breaths to keep your lungs clear and active. You may be asked to use a spirometer after a surgery, if you have a lung problem or a history of smoking, or if you have been inactive for a long period of time. Use your incentive spirometer as instructed every 1-2 hours while you are awake. If you have an incision on your chest or abdomen, place a pillow or a rolled-up towel firmly against your incision when you cough. This will help to reduce pain. Get help right away if you have shortness of breath, you cough up bloody mucus, or blood comes from your incision when you cough. This information is not intended to replace advice given to you by your health care provider. Make sure you discuss any questions you have with your health care provider. Document Revised: 05/04/2019 Document Reviewed: 05/04/2019 Elsevier Patient Education  2023 Elsevier Inc.   Preoperative Educational Videos for Total Hip, Knee and Shoulder Replacements  To better prepare for surgery, please view our videos that explain the physical activity and discharge planning required to have the best surgical recovery at [facility].  [email or web address]  Questions? Call [phone] or email [email or web address]

## 2024-03-27 ENCOUNTER — Ambulatory Visit: Admitting: Internal Medicine

## 2024-03-27 ENCOUNTER — Other Ambulatory Visit

## 2024-03-28 LAB — URINE CULTURE: Culture: NO GROWTH

## 2024-04-01 ENCOUNTER — Ambulatory Visit

## 2024-04-01 ENCOUNTER — Encounter: Admission: RE | Payer: Self-pay | Source: Home / Self Care

## 2024-04-01 ENCOUNTER — Other Ambulatory Visit: Payer: Self-pay

## 2024-04-01 ENCOUNTER — Ambulatory Visit: Payer: Self-pay | Admitting: Urgent Care

## 2024-04-01 ENCOUNTER — Observation Stay: Admission: RE | Admit: 2024-04-01 | Source: Home / Self Care

## 2024-04-01 ENCOUNTER — Encounter: Payer: Self-pay | Admitting: Urgent Care

## 2024-04-01 ENCOUNTER — Other Ambulatory Visit: Payer: Self-pay | Admitting: Physician Assistant

## 2024-04-01 ENCOUNTER — Observation Stay

## 2024-04-01 DIAGNOSIS — R8271 Bacteriuria: Secondary | ICD-10-CM

## 2024-04-01 DIAGNOSIS — Z9889 Other specified postprocedural states: Secondary | ICD-10-CM

## 2024-04-01 DIAGNOSIS — E119 Type 2 diabetes mellitus without complications: Secondary | ICD-10-CM

## 2024-04-01 DIAGNOSIS — Z96642 Presence of left artificial hip joint: Secondary | ICD-10-CM

## 2024-04-01 DIAGNOSIS — M169 Osteoarthritis of hip, unspecified: Principal | ICD-10-CM

## 2024-04-01 DIAGNOSIS — Z01812 Encounter for preprocedural laboratory examination: Secondary | ICD-10-CM

## 2024-04-01 DIAGNOSIS — R8290 Unspecified abnormal findings in urine: Secondary | ICD-10-CM

## 2024-04-01 DIAGNOSIS — M810 Age-related osteoporosis without current pathological fracture: Secondary | ICD-10-CM

## 2024-04-01 HISTORY — DX: Unspecified diastolic (congestive) heart failure: I50.30

## 2024-04-01 HISTORY — DX: Long term (current) use of antithrombotics/antiplatelets: Z79.02

## 2024-04-01 HISTORY — DX: Chronic kidney disease, stage 3 unspecified: N18.30

## 2024-04-01 HISTORY — DX: Other supraventricular tachycardia: I47.19

## 2024-04-01 HISTORY — DX: Other cerebrovascular disease: I67.89

## 2024-04-01 HISTORY — DX: Peripheral vascular disease, unspecified: I73.9

## 2024-04-01 LAB — GLUCOSE, CAPILLARY
Glucose-Capillary: 101 mg/dL — ABNORMAL HIGH (ref 70–99)
Glucose-Capillary: 84 mg/dL (ref 70–99)

## 2024-04-01 MED ORDER — LACTATED RINGERS IV SOLN: INTRAVENOUS | Status: DC

## 2024-04-01 MED ORDER — PANTOPRAZOLE SODIUM 40 MG PO TBEC
40.0000 mg | DELAYED_RELEASE_TABLET | Freq: Two times a day (BID) | ORAL | Status: AC
  Administered 2024-04-02 – 2024-04-03 (×3): 40 mg via ORAL
  Filled 2024-04-01 (×4): qty 1

## 2024-04-01 MED ORDER — METOPROLOL SUCCINATE ER 25 MG PO TB24
12.5000 mg | ORAL_TABLET | Freq: Every day | ORAL | Status: AC
  Administered 2024-04-03: 12.5 mg via ORAL
  Filled 2024-04-01 (×2): qty 0.5
  Filled 2024-04-01: qty 1

## 2024-04-01 MED ORDER — SODIUM CHLORIDE 0.9 % IV SOLN: INTRAVENOUS | Status: DC

## 2024-04-01 MED ORDER — OXYCODONE HCL 5 MG/5ML PO SOLN: 5.0000 mg | Freq: Once | ORAL | Status: AC | PRN

## 2024-04-01 MED ORDER — METOCLOPRAMIDE HCL 5 MG/ML IJ SOLN: 10.0000 mg | Freq: Once | INTRAMUSCULAR | Status: DC | PRN

## 2024-04-01 MED ORDER — FENTANYL CITRATE (PF) 100 MCG/2ML IJ SOLN
INTRAMUSCULAR | Status: AC
  Filled 2024-04-01: qty 2

## 2024-04-01 MED ORDER — BUPIVACAINE LIPOSOME 1.3 % IJ SUSP
INTRAMUSCULAR | Status: AC
  Filled 2024-04-01: qty 20

## 2024-04-01 MED ORDER — ACETAMINOPHEN 500 MG PO TABS
1000.0000 mg | ORAL_TABLET | Freq: Once | ORAL | Status: AC
  Administered 2024-04-01: 1000 mg via ORAL

## 2024-04-01 MED ORDER — TRANEXAMIC ACID-NACL 1000-0.7 MG/100ML-% IV SOLN
INTRAVENOUS | Status: AC
  Filled 2024-04-01: qty 100

## 2024-04-01 MED ORDER — DEXAMETHASONE SOD PHOSPHATE PF 10 MG/ML IJ SOLN
INTRAMUSCULAR | Status: AC
  Filled 2024-04-01: qty 1

## 2024-04-01 MED ORDER — ACETAMINOPHEN 500 MG PO TABS
ORAL_TABLET | ORAL | Status: AC
  Filled 2024-04-01: qty 2

## 2024-04-01 MED ORDER — BUPIVACAINE LIPOSOME 1.3 % IJ SUSP: 10.0000 mL | Freq: Once | INTRAMUSCULAR | Status: DC

## 2024-04-01 MED ORDER — CHLORHEXIDINE GLUCONATE 0.12 % MT SOLN
OROMUCOSAL | Status: AC
  Filled 2024-04-01: qty 15

## 2024-04-01 MED ORDER — TRANEXAMIC ACID-NACL 1000-0.7 MG/100ML-% IV SOLN: 1000.0000 mg | INTRAVENOUS | Status: DC

## 2024-04-01 MED ORDER — ACETAMINOPHEN 10 MG/ML IV SOLN: 1000.0000 mg | Freq: Once | INTRAVENOUS | Status: DC | PRN

## 2024-04-01 MED ORDER — CEFAZOLIN SODIUM-DEXTROSE 2-4 GM/100ML-% IV SOLN
2.0000 g | Freq: Three times a day (TID) | INTRAVENOUS | Status: AC
  Administered 2024-04-01 – 2024-04-02 (×2): 2 g via INTRAVENOUS
  Filled 2024-04-01: qty 100

## 2024-04-01 MED ORDER — SODIUM CHLORIDE 0.9 % IR SOLN
Status: DC | PRN
  Administered 2024-04-01: 3000 mL

## 2024-04-01 MED ORDER — ONDANSETRON HCL 4 MG/2ML IJ SOLN: 4.0000 mg | Freq: Four times a day (QID) | INTRAMUSCULAR | Status: AC | PRN

## 2024-04-01 MED ORDER — POLYETHYLENE GLYCOL 3350 17 G PO PACK
17.0000 g | PACK | Freq: Two times a day (BID) | ORAL | Status: DC
  Administered 2024-04-02 – 2024-04-03 (×2): 17 g via ORAL
  Filled 2024-04-01 (×4): qty 1

## 2024-04-01 MED ORDER — MENTHOL 3 MG MT LOZG: 1.0000 | LOZENGE | OROMUCOSAL | Status: AC | PRN

## 2024-04-01 MED ORDER — METOCLOPRAMIDE HCL 5 MG PO TABS
5.0000 mg | ORAL_TABLET | Freq: Three times a day (TID) | ORAL | Status: AC | PRN
  Administered 2024-04-02: 10 mg via ORAL
  Filled 2024-04-01: qty 2

## 2024-04-01 MED ORDER — ORAL CARE MOUTH RINSE: 15.0000 mL | Freq: Once | OROMUCOSAL | Status: AC

## 2024-04-01 MED ORDER — CLOPIDOGREL BISULFATE 75 MG PO TABS
75.0000 mg | ORAL_TABLET | Freq: Every day | ORAL | Status: AC
  Administered 2024-04-02 – 2024-04-03 (×2): 75 mg via ORAL
  Filled 2024-04-01 (×2): qty 1

## 2024-04-01 MED ORDER — OXYCODONE HCL 5 MG PO TABS: 5.0000 mg | ORAL_TABLET | Freq: Once | ORAL | Status: DC | PRN

## 2024-04-01 MED ORDER — OXYCODONE HCL 5 MG PO TABS
5.0000 mg | ORAL_TABLET | Freq: Once | ORAL | Status: AC | PRN
  Administered 2024-04-01: 5 mg via ORAL

## 2024-04-01 MED ORDER — BUPIVACAINE IN DEXTROSE 0.75-8.25 % IT SOLN
INTRATHECAL | Status: DC | PRN
  Administered 2024-04-01: 1.8 mL via INTRATHECAL

## 2024-04-01 MED ORDER — CEFAZOLIN SODIUM-DEXTROSE 2-4 GM/100ML-% IV SOLN
2.0000 g | INTRAVENOUS | Status: AC
  Administered 2024-04-01: 2 g via INTRAVENOUS

## 2024-04-01 MED ORDER — ROSUVASTATIN CALCIUM 10 MG PO TABS
10.0000 mg | ORAL_TABLET | Freq: Every day | ORAL | Status: AC
  Administered 2024-04-03: 10 mg via ORAL
  Filled 2024-04-01 (×2): qty 1

## 2024-04-01 MED ORDER — SENNA 8.6 MG PO TABS
1.0000 | ORAL_TABLET | Freq: Every day | ORAL | Status: AC
  Administered 2024-04-03: 8.6 mg via ORAL
  Filled 2024-04-01 (×2): qty 1

## 2024-04-01 MED ORDER — LACTATED RINGERS IV SOLN: INTRAVENOUS | Status: AC

## 2024-04-01 MED ORDER — ALBUTEROL SULFATE (2.5 MG/3ML) 0.083% IN NEBU: 2.5000 mg | INHALATION_SOLUTION | Freq: Every day | RESPIRATORY_TRACT | Status: AC | PRN

## 2024-04-01 MED ORDER — HYDROMORPHONE HCL 1 MG/ML IJ SOLN
INTRAMUSCULAR | Status: AC
  Filled 2024-04-01: qty 1

## 2024-04-01 MED ORDER — PROPOFOL 1000 MG/100ML IV EMUL
INTRAVENOUS | Status: AC
  Filled 2024-04-01: qty 100

## 2024-04-01 MED ORDER — OXYCODONE HCL 5 MG PO TABS
ORAL_TABLET | ORAL | Status: AC
  Filled 2024-04-01: qty 1

## 2024-04-01 MED ORDER — MONTELUKAST SODIUM 10 MG PO TABS
10.0000 mg | ORAL_TABLET | Freq: Every day | ORAL | Status: AC
  Administered 2024-04-01 – 2024-04-03 (×3): 10 mg via ORAL
  Filled 2024-04-01 (×3): qty 1

## 2024-04-01 MED ORDER — FLUTICASONE PROPIONATE 50 MCG/ACT NA SUSP: 2.0000 | Freq: Every day | NASAL | Status: AC | PRN

## 2024-04-01 MED ORDER — EPHEDRINE 5 MG/ML INJ
INTRAVENOUS | Status: AC
  Filled 2024-04-01: qty 5

## 2024-04-01 MED ORDER — CHLORHEXIDINE GLUCONATE 0.12 % MT SOLN
15.0000 mL | Freq: Once | OROMUCOSAL | Status: AC
  Administered 2024-04-01: 15 mL via OROMUCOSAL

## 2024-04-01 MED ORDER — TRANEXAMIC ACID-NACL 1000-0.7 MG/100ML-% IV SOLN
INTRAVENOUS | Status: DC | PRN
  Administered 2024-04-01: 1000 mg via INTRAVENOUS

## 2024-04-01 MED ORDER — KETOROLAC TROMETHAMINE 30 MG/ML IJ SOLN
INTRAMUSCULAR | Status: AC
  Filled 2024-04-01: qty 1

## 2024-04-01 MED ORDER — METOCLOPRAMIDE HCL 5 MG/ML IJ SOLN: 5.0000 mg | Freq: Three times a day (TID) | INTRAMUSCULAR | Status: AC | PRN

## 2024-04-01 MED ORDER — ONDANSETRON HCL 4 MG PO TABS: 4.0000 mg | ORAL_TABLET | Freq: Four times a day (QID) | ORAL | Status: AC | PRN

## 2024-04-01 MED ORDER — ONDANSETRON HCL 4 MG/2ML IJ SOLN
INTRAMUSCULAR | Status: AC
  Filled 2024-04-01: qty 2

## 2024-04-01 MED ORDER — SODIUM CHLORIDE (PF) 0.9 % IJ SOLN
INTRAMUSCULAR | Status: DC | PRN
  Administered 2024-04-01: 40 mL

## 2024-04-01 MED ORDER — PHENOL 1.4 % MT LIQD: 1.0000 | OROMUCOSAL | Status: AC | PRN

## 2024-04-01 MED ORDER — POVIDONE-IODINE 10 % EX SWAB
2.0000 | Freq: Once | CUTANEOUS | Status: AC
  Administered 2024-04-01: 2 via TOPICAL

## 2024-04-01 MED ORDER — FENTANYL CITRATE (PF) 100 MCG/2ML IJ SOLN
25.0000 ug | INTRAMUSCULAR | Status: DC | PRN
  Administered 2024-04-01: 25 ug via INTRAVENOUS
  Administered 2024-04-01: 50 ug via INTRAVENOUS
  Administered 2024-04-01: 25 ug via INTRAVENOUS

## 2024-04-01 MED ORDER — PHENYLEPHRINE 80 MCG/ML (10ML) SYRINGE FOR IV PUSH (FOR BLOOD PRESSURE SUPPORT)
PREFILLED_SYRINGE | INTRAVENOUS | Status: DC | PRN
  Administered 2024-04-01 (×3): 80 ug via INTRAVENOUS
  Administered 2024-04-01 (×2): 160 ug via INTRAVENOUS
  Administered 2024-04-01: 80 ug via INTRAVENOUS
  Administered 2024-04-01: 160 ug via INTRAVENOUS
  Administered 2024-04-01: 80 ug via INTRAVENOUS
  Administered 2024-04-01: 120 ug via INTRAVENOUS

## 2024-04-01 MED ORDER — ACETAMINOPHEN 500 MG PO TABS
1000.0000 mg | ORAL_TABLET | Freq: Four times a day (QID) | ORAL | Status: AC
  Administered 2024-04-01 – 2024-04-02 (×2): 1000 mg via ORAL
  Filled 2024-04-01 (×2): qty 2

## 2024-04-01 MED ORDER — OXYCODONE HCL 5 MG PO TABS
5.0000 mg | ORAL_TABLET | ORAL | Status: DC | PRN
  Administered 2024-04-01 – 2024-04-02 (×3): 10 mg via ORAL
  Filled 2024-04-01 (×5): qty 2

## 2024-04-01 MED ORDER — ASPIRIN 81 MG PO CHEW
81.0000 mg | CHEWABLE_TABLET | Freq: Two times a day (BID) | ORAL | Status: AC
  Administered 2024-04-01 – 2024-04-03 (×5): 81 mg via ORAL
  Filled 2024-04-01 (×5): qty 1

## 2024-04-01 MED ORDER — FENTANYL CITRATE (PF) 100 MCG/2ML IJ SOLN
INTRAMUSCULAR | Status: DC | PRN
  Administered 2024-04-01: 50 ug via INTRAVENOUS

## 2024-04-01 MED ORDER — PROPOFOL 500 MG/50ML IV EMUL
INTRAVENOUS | Status: DC | PRN
  Administered 2024-04-01: 100 ug/kg/min via INTRAVENOUS

## 2024-04-01 MED ORDER — IPRATROPIUM-ALBUTEROL 0.5-2.5 (3) MG/3ML IN SOLN
RESPIRATORY_TRACT | Status: AC
  Filled 2024-04-01: qty 3

## 2024-04-01 MED ORDER — PHENYLEPHRINE HCL-NACL 20-0.9 MG/250ML-% IV SOLN
INTRAVENOUS | Status: DC | PRN
  Administered 2024-04-01: 50 ug/min via INTRAVENOUS

## 2024-04-01 MED ORDER — OXYCODONE HCL 5 MG/5ML PO SOLN: 5.0000 mg | Freq: Once | ORAL | Status: DC | PRN

## 2024-04-01 MED ORDER — EPHEDRINE SULFATE-NACL 50-0.9 MG/10ML-% IV SOSY
PREFILLED_SYRINGE | INTRAVENOUS | Status: DC | PRN
  Administered 2024-04-01: 5 mg via INTRAVENOUS
  Administered 2024-04-01: 10 mg via INTRAVENOUS

## 2024-04-01 MED ORDER — DULOXETINE HCL 60 MG PO CPEP
60.0000 mg | ORAL_CAPSULE | Freq: Every day | ORAL | Status: AC
  Administered 2024-04-03: 60 mg via ORAL
  Filled 2024-04-01 (×2): qty 1
  Filled 2024-04-01: qty 2

## 2024-04-01 MED ORDER — PHENYLEPHRINE HCL-NACL 20-0.9 MG/250ML-% IV SOLN
INTRAVENOUS | Status: AC
  Filled 2024-04-01: qty 250

## 2024-04-01 MED ORDER — SALINE SPRAY 0.65 % NA SOLN: 1.0000 | NASAL | Status: AC | PRN

## 2024-04-01 MED ORDER — PHENYLEPHRINE 80 MCG/ML (10ML) SYRINGE FOR IV PUSH (FOR BLOOD PRESSURE SUPPORT)
PREFILLED_SYRINGE | INTRAVENOUS | Status: AC
  Filled 2024-04-01: qty 10

## 2024-04-01 MED ORDER — HYDROMORPHONE HCL 1 MG/ML IJ SOLN
0.5000 mg | INTRAMUSCULAR | Status: DC | PRN
  Administered 2024-04-01: 0.5 mg via INTRAVENOUS
  Administered 2024-04-02: 1 mg via INTRAVENOUS
  Filled 2024-04-01: qty 1

## 2024-04-01 MED ORDER — BISACODYL 5 MG PO TBEC: 5.0000 mg | DELAYED_RELEASE_TABLET | Freq: Every day | ORAL | Status: AC | PRN

## 2024-04-01 MED ORDER — ALLOPURINOL 300 MG PO TABS
300.0000 mg | ORAL_TABLET | Freq: Every day | ORAL | Status: AC
  Administered 2024-04-03: 300 mg via ORAL
  Filled 2024-04-01 (×2): qty 1

## 2024-04-01 MED ORDER — TRANEXAMIC ACID-NACL 1000-0.7 MG/100ML-% IV SOLN
1000.0000 mg | Freq: Once | INTRAVENOUS | Status: AC
  Administered 2024-04-01: 1000 mg via INTRAVENOUS

## 2024-04-01 MED ORDER — SODIUM CHLORIDE (PF) 0.9 % IJ SOLN
INTRAMUSCULAR | Status: AC
  Filled 2024-04-01: qty 20

## 2024-04-01 MED ORDER — SENNOSIDES-DOCUSATE SODIUM 8.6-50 MG PO TABS: 1.0000 | ORAL_TABLET | Freq: Every evening | ORAL | Status: AC | PRN

## 2024-04-01 MED ORDER — PROPOFOL 10 MG/ML IV BOLUS
INTRAVENOUS | Status: AC
  Filled 2024-04-01: qty 20

## 2024-04-01 MED ORDER — VITAMIN D 25 MCG (1000 UNIT) PO TABS
2000.0000 [IU] | ORAL_TABLET | Freq: Every day | ORAL | Status: AC
  Administered 2024-04-03: 2000 [IU] via ORAL
  Filled 2024-04-01 (×2): qty 2

## 2024-04-01 MED ORDER — FERROUS SULFATE 325 (65 FE) MG PO TABS
325.0000 mg | ORAL_TABLET | ORAL | Status: AC
  Filled 2024-04-01: qty 1

## 2024-04-01 MED ORDER — ROCURONIUM BROMIDE 10 MG/ML (PF) SYRINGE
PREFILLED_SYRINGE | INTRAVENOUS | Status: AC
  Filled 2024-04-01: qty 10

## 2024-04-01 MED ORDER — HYDROMORPHONE HCL 1 MG/ML IJ SOLN: 0.2500 mg | INTRAMUSCULAR | Status: DC | PRN

## 2024-04-01 MED ORDER — CEFAZOLIN SODIUM-DEXTROSE 2-4 GM/100ML-% IV SOLN
INTRAVENOUS | Status: AC
  Filled 2024-04-01: qty 100

## 2024-04-01 MED ORDER — LIDOCAINE HCL (PF) 2 % IJ SOLN
INTRAMUSCULAR | Status: AC
  Filled 2024-04-01: qty 5

## 2024-04-01 MED ORDER — MIDAZOLAM HCL 5 MG/5ML IJ SOLN
INTRAMUSCULAR | Status: DC | PRN
  Administered 2024-04-01: .5 mg via INTRAVENOUS

## 2024-04-01 MED ORDER — MIDAZOLAM HCL 2 MG/2ML IJ SOLN
INTRAMUSCULAR | Status: AC
  Filled 2024-04-01: qty 2

## 2024-04-01 NOTE — Transfer of Care (Signed)
 Immediate Anesthesia Transfer of Care Note  Patient: Kerry Powell  Procedure(s) Performed: CONVERSION, PREVIOUS HIP SURGERY, TO TOTAL HIP ARTHROPLASTY (Left: Hip)  Patient Location: PACU  Anesthesia Type:Spinal  Level of Consciousness: awake and drowsy  Airway & Oxygen Therapy: Patient Spontanous Breathing and Patient connected to face mask oxygen  Post-op Assessment: Report given to RN and Post -op Vital signs reviewed and stable  Post vital signs: Reviewed and stable  Last Vitals:  Vitals Value Taken Time  BP 105/70 04/01/24 14:30  Temp    Pulse 74 04/01/24 14:36  Resp 18 04/01/24 14:36  SpO2 96 % 04/01/24 14:36  Vitals shown include unfiled device data.  Last Pain:  Vitals:   04/01/24 0937  TempSrc: Temporal  PainSc: 10-Worst pain ever         Complications: There were no known notable events for this encounter.

## 2024-04-01 NOTE — Anesthesia Postprocedure Evaluation (Signed)
"   Anesthesia Post Note  Patient: Kerry Powell  Procedure(s) Performed: CONVERSION, PREVIOUS HIP SURGERY, TO TOTAL HIP ARTHROPLASTY (Left: Hip)  Patient location during evaluation: PACU Anesthesia Type: General and Spinal Level of consciousness: sedated, patient cooperative and awake Pain management: pain level controlled Vital Signs Assessment: post-procedure vital signs reviewed and stable Respiratory status: spontaneous breathing, respiratory function stable and patient connected to nasal cannula oxygen Cardiovascular status: blood pressure returned to baseline and stable Postop Assessment: no headache, no backache and no apparent nausea or vomiting Anesthetic complications: no   There were no known notable events for this encounter.   Last Vitals:  Vitals:   04/01/24 0937  BP: 101/79  Pulse: 76  Resp: 17  Temp: (!) 35.9 C  SpO2: 96%    Last Pain:  Vitals:   04/01/24 0937  TempSrc: Temporal  PainSc: 10-Worst pain ever                 Redell MARLA Breaker      "

## 2024-04-01 NOTE — Brief Op Note (Signed)
 04/01/2024 11:12 AM  2:29 PM  PATIENT:  Kerry Powell  79 y.o. female  PRE-OPERATIVE DIAGNOSIS:  Mechanical Complication Associated with Orthopedic Device, Status Post Cephalomedullary Nailing  POST-OPERATIVE DIAGNOSIS:  Mechanical Complication Associated with Orthopedic Device, Status Post Cephalomedullary Nailing  PROCEDURE:  Procedures with comments: CONVERSION, PREVIOUS HIP SURGERY, TO TOTAL HIP ARTHROPLASTY (Left) - CONVERSION TO LEFT TOTAL HIP ARTHROPLASTY, POSTERIOR APPROACH  SURGEON:  Surgeons and Role:    * Cynde Menard, Thamas RAMAN, MD - Primary  PHYSICIAN ASSISTANT: Lucie Stabs PA  ANESTHESIA:   spinal  EBL:  300 mL   BLOOD ADMINISTERED:none  DRAINS: none   LOCAL MEDICATIONS USED:  Exparel   SPECIMEN:  No Specimen  DISPOSITION OF SPECIMEN:  N/A  COUNTS:  YES  TOURNIQUET:  * No tourniquets in log *  DICTATION: .Note written in EPIC  PLAN OF CARE: Admit to inpatient   PATIENT DISPOSITION:  PACU - hemodynamically stable.   Delay start of Pharmacological VTE agent (>24hrs) due to surgical blood loss or risk of bleeding: no

## 2024-04-01 NOTE — Progress Notes (Signed)
 This RN spoke briefly with Lucie Stabs, PA-C regarding coverage for MD over night.  Call back number [phone] given to contact Samantha directly if there were any needs or concerns over night.

## 2024-04-01 NOTE — Anesthesia Procedure Notes (Addendum)
 Spinal  Patient location during procedure: OR Start time: 04/01/2024 11:30 AM End time: 04/01/2024 11:34 AM Reason for block: surgical anesthesia  Staffing Performed: anesthesiologist  Authorized by: Kradel, Brian K, MD   Performed by: Leray Garverick R, CRNA  Preanesthetic Checklist Completed: patient identified, IV checked, site marked, risks and benefits discussed, surgical consent, monitors and equipment checked, pre-op evaluation and timeout performed Spinal Block Patient position: sitting Prep: DuraPrep Patient monitoring: heart rate, cardiac monitor, continuous pulse ox and blood pressure Approach: midline Location: L3-4 Injection technique: single-shot Needle Needle type: Whitacre  Needle gauge: 22 G Needle length: 9 cm Assessment Sensory level: T10 Events: CSF return  Additional Notes Meticulous sterile technique used throughout (CHG prep, sterile gloves, sterile drape). Negative paresthesia. Negative blood return. Positive free-flowing CSF. Expiration date of kit checked and confirmed. Patient tolerated procedure well, without complications.

## 2024-04-01 NOTE — Interval H&P Note (Signed)
 History and Physical Interval Note:  04/01/2024 11:11 AM  Kerry Powell  has presented today for surgery, with the diagnosis of Mechanical Complication Associated with Orthopedic Device, Status Post Cephalomedullary Nailing.  The various methods of treatment have been discussed with the patient and family. After consideration of risks, benefits and other options for treatment, the patient has consented to  Procedures with comments: CONVERSION, PREVIOUS HIP SURGERY, TO TOTAL HIP ARTHROPLASTY (Left) - CONVERSION TO LEFT TOTAL HIP ARTHROPLASTY, POSTERIOR APPROACH as a surgical intervention.  The patient's history has been reviewed, patient examined, no change in status, stable for surgery.  I have reviewed the patient's chart and labs.  Questions were answered to the patient's satisfaction.     [name] [name]

## 2024-04-01 NOTE — Anesthesia Preprocedure Evaluation (Signed)
"                                    Anesthesia Evaluation  Patient identified by MRN, date of birth, ID band Patient awake    Reviewed: Allergy & Precautions, H&P , NPO status , Patient's Chart, lab work & pertinent test results  Airway Mallampati: II  TM Distance: >3 FB Neck ROM: Full    Dental no notable dental hx.    Pulmonary shortness of breath, COPD, former smoker   Pulmonary exam normal breath sounds clear to auscultation       Cardiovascular hypertension, + CAD, + Peripheral Vascular Disease and +CHF  Normal cardiovascular exam Rhythm:Regular Rate:Normal     Neuro/Psych  Headaches PSYCHIATRIC DISORDERS Anxiety Depression Bipolar Disorder   TIA Neuromuscular disease CVA    GI/Hepatic Neg liver ROS, PUD,GERD  ,,  Endo/Other  diabetes    Renal/GU Renal disease  negative genitourinary   Musculoskeletal negative musculoskeletal ROS (+)    Abdominal   Peds negative pediatric ROS (+)  Hematology  (+) Blood dyscrasia, anemia   Anesthesia Other Findings   Reproductive/Obstetrics negative OB ROS                              Anesthesia Physical Anesthesia Plan  ASA: 3  Anesthesia Plan: General   Post-op Pain Management:    Induction: Intravenous and Inhalational  PONV Risk Score and Plan: 1 and Ondansetron  and Dexamethasone   Airway Management Planned: Oral ETT  Additional Equipment:   Intra-op Plan:   Post-operative Plan: Extubation in OR  Informed Consent: I have reviewed the patients History and Physical, chart, labs and discussed the procedure including the risks, benefits and alternatives for the proposed anesthesia with the patient or authorized representative who has indicated his/her understanding and acceptance.     Dental advisory given  Plan Discussed with: CRNA  Anesthesia Plan Comments:         Anesthesia Quick Evaluation  "

## 2024-04-02 ENCOUNTER — Other Ambulatory Visit: Payer: Self-pay | Admitting: Physician Assistant

## 2024-04-02 ENCOUNTER — Telehealth

## 2024-04-02 DIAGNOSIS — Z96642 Presence of left artificial hip joint: Secondary | ICD-10-CM

## 2024-04-02 LAB — BASIC METABOLIC PANEL WITH GFR
Anion gap: 11 (ref 5–15)
BUN: 20 mg/dL (ref 8–23)
CO2: 23 mmol/L (ref 22–32)
Calcium: 8.7 mg/dL — ABNORMAL LOW (ref 8.9–10.3)
Chloride: 102 mmol/L (ref 98–111)
Creatinine, Ser: 1.57 mg/dL — ABNORMAL HIGH (ref 0.44–1.00)
GFR, Estimated: 33 mL/min — ABNORMAL LOW
Glucose, Bld: 117 mg/dL — ABNORMAL HIGH (ref 70–99)
Potassium: 4.2 mmol/L (ref 3.5–5.1)
Sodium: 137 mmol/L (ref 135–145)

## 2024-04-02 LAB — TROPONIN T, HIGH SENSITIVITY: Troponin T High Sensitivity: 41 ng/L — ABNORMAL HIGH (ref 0–19)

## 2024-04-02 LAB — CBC
HCT: 29.7 % — ABNORMAL LOW (ref 36.0–46.0)
Hemoglobin: 9.3 g/dL — ABNORMAL LOW (ref 12.0–15.0)
MCH: 27.7 pg (ref 26.0–34.0)
MCHC: 31.3 g/dL (ref 30.0–36.0)
MCV: 88.4 fL (ref 80.0–100.0)
Platelets: 220 10*3/uL (ref 150–400)
RBC: 3.36 MIL/uL — ABNORMAL LOW (ref 3.87–5.11)
RDW: 17 % — ABNORMAL HIGH (ref 11.5–15.5)
WBC: 13.8 10*3/uL — ABNORMAL HIGH (ref 4.0–10.5)
nRBC: 0 % (ref 0.0–0.2)

## 2024-04-02 LAB — TSH: TSH: 3 u[IU]/mL (ref 0.350–4.500)

## 2024-04-02 LAB — CORTISOL: Cortisol, Plasma: 14.4 ug/dL

## 2024-04-02 LAB — LACTIC ACID, PLASMA: Lactic Acid, Venous: 1.8 mmol/L (ref 0.5–1.9)

## 2024-04-02 MED ORDER — CEFAZOLIN SODIUM-DEXTROSE 2-4 GM/100ML-% IV SOLN
INTRAVENOUS | Status: AC
  Filled 2024-04-02: qty 100

## 2024-04-02 MED ORDER — MIDODRINE HCL 5 MG PO TABS
5.0000 mg | ORAL_TABLET | Freq: Three times a day (TID) | ORAL | Status: DC
  Administered 2024-04-02: 5 mg via ORAL
  Filled 2024-04-02: qty 1

## 2024-04-02 MED ORDER — HYDROMORPHONE HCL 2 MG PO TABS
1.0000 mg | ORAL_TABLET | Freq: Four times a day (QID) | ORAL | Status: AC | PRN
  Administered 2024-04-02: 1 mg via ORAL
  Administered 2024-04-03: 2 mg via ORAL
  Filled 2024-04-02 (×2): qty 1

## 2024-04-02 MED ORDER — METOCLOPRAMIDE HCL 10 MG PO TABS
ORAL_TABLET | ORAL | Status: AC
  Filled 2024-04-02: qty 1

## 2024-04-02 MED ORDER — HYDROMORPHONE HCL 1 MG/ML IJ SOLN
0.5000 mg | INTRAMUSCULAR | Status: AC | PRN
  Administered 2024-04-03 (×2): 0.5 mg via INTRAVENOUS
  Filled 2024-04-02 (×2): qty 0.5

## 2024-04-02 MED ORDER — MIDODRINE HCL 5 MG PO TABS
2.5000 mg | ORAL_TABLET | Freq: Three times a day (TID) | ORAL | Status: AC
  Administered 2024-04-03 (×3): 2.5 mg via ORAL
  Filled 2024-04-02 (×3): qty 1

## 2024-04-02 NOTE — TOC Initial Note (Signed)
 Transition of Care [facility]) - Initial/Assessment Note    Patient Details  Name: Kerry Powell MRN: [id number] Date of Birth: [birth year]  Transition of Care [facility] CM/SW Contact:    Gilda Abboud L Soleil Mas, LCSW Phone Number: 04/02/2024, 5:45 PM  Clinical Narrative:                  [facility] consult received for SNF placement. CSW met with patient. No family at bedside. Patient advised of recent falls and concerns regarding joints. She reported living at home with her spouse but advised that he is needing quite a bit of assistance as well.   Patient has Home Health services from [facility]. CSW discussed recommendations for SNF. CSW reiterated all of the information patient shared. CSW advised that it is not likely that discharging home is the safest option. Patient became tearful. She doesn't want to go to SNF but is aware that it is the better option for her.   Preference was discussed. Patient is open to [facility] in [place]. She advised that it is the only facility she would go to. She stated that her son is coming for a visit. She stated that she will discuss options with him. Patient agreeable to starting the process.   FL2 will be completed.        Patient Goals and CMS Choice            Expected Discharge Plan and Services                                              Prior Living Arrangements/Services                       Activities of Daily Living   ADL Screening (condition at time of admission) Independently performs ADLs?: No Is the patient deaf or have difficulty hearing?: No Does the patient have difficulty seeing, even when wearing glasses/contacts?: No Does the patient have difficulty concentrating, remembering, or making decisions?: No  Permission Sought/Granted                  Emotional Assessment              Admission diagnosis:  Failed orthopedic implant, initial encounter [T84.498A] S/P hip replacement, left  [S03.357] Patient Active Problem List   Diagnosis Date Noted   S/P hip replacement, left 04/01/2024   Age-related osteoporosis without current pathological fracture 03/09/2024   Closed right scapular fracture 01/28/2024   Acute hypoxic respiratory failure (HCC) 12/20/2023   History of femur fracture 12/09/2023   Orthostatic hypotension 11/22/2023   Protein-calorie malnutrition, severe 10/31/2023   Recurrent falls 10/21/2023   Closed comminuted intertrochanteric fracture of proximal end of left femur (HCC) 10/21/2023   Chronic respiratory failure with hypoxia (HCC) 10/16/2023   Sterile pyuria 10/07/2023   Hoarseness, chronic 06/05/2023   Hypoxia 03/06/2023   CKD (chronic kidney disease) stage 4, GFR 15-29 ml/min (HCC) 02/11/2023   Chronic diastolic CHF (congestive heart failure) (HCC) 02/11/2023   PAD (peripheral artery disease) 05/15/2022   Pre-syncope 02/01/2022   Peroneal tendinitis 12/04/2021   Pain in joint of left shoulder 11/16/2021   Osteoarthritis of knees, bilateral 05/04/2021   Pain in joint of right knee 05/04/2021   Osteoarthritis of left knee 05/03/2021   B12 deficiency 08/03/2020   Diabetic polyneuropathy associated with type 2  diabetes mellitus (HCC) 07/29/2020   Diastolic dysfunction without heart failure 01/20/2020   Pain of left thumb 12/24/2019   Pain of right thumb 12/24/2019   Polyarthralgia 12/07/2019   Anemia 12/07/2019   Fatigue 12/07/2019   Acquired thrombophilia 07/14/2019   CAD (coronary artery disease), native coronary artery    Cervical spine pain 06/16/2019   Pleural plaque 04/27/2019   Muscle spasm 11/14/2018   SOB (shortness of breath) 04/17/2018   Vitamin D  deficiency 03/12/2018   Thoracic aortic aneurysm 02/06/2018   Pain in left knee 01/14/2018   Sleep difficulties 09/09/2017   Depression 07/01/2017   Dizziness 06/10/2017   H/O renal cell carcinoma 05/23/2017   History of kidney cancer 05/23/2017   Cervical radiculopathy 10/31/2016    Hyperlipidemia associated with type 2 diabetes mellitus (HCC) 06/25/2016   Occipital neuralgia 05/09/2016   Anxiety 07/26/2015   Chronic neck pain 04/30/2015   Type 2 diabetes mellitus with stage 4 chronic kidney disease, without long-term current use of insulin  (HCC) 04/27/2015   History of CVA (cerebrovascular accident) 04/26/2015   Gout 04/26/2015   Hypertension associated with type 2 diabetes mellitus (HCC) 04/26/2015   Snoring 04/26/2015   Bilateral leg edema 04/26/2015   Dysphagia 04/26/2015   GERD (gastroesophageal reflux disease) 04/26/2015   History of colonic polyps 05/29/2007   HIATAL HERNIA 04/07/2007   RESTLESS LEG SYNDROME 04/04/2007   RHINOSINUSITIS, 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   COPD (chronic obstructive pulmonary disease) (HCC) 04/04/2007   IRRITABLE BOWEL SYNDROME 04/04/2007   Osteoarthritis, diffuse 04/04/2007   PCP:  Geofm Glade PARAS, MD Pharmacy:   [facility] [id number]), [place] - [address] [place] ([place]) [name] [address] Phone: [phone] Fax: [phone]  [facility] [address] [name] [address] Phone: [phone] Fax: [phone]     Social Drivers of Health (SDOH) Social History: SDOH Screenings   Food Insecurity: No Food Insecurity (04/01/2024)  Housing: Low Risk (04/01/2024)  Transportation Needs: No Transportation Needs (04/01/2024)  Utilities: Not At Risk (04/01/2024)  Alcohol  Screen: Low Risk (05/15/2023)  Depression (PHQ2-9): High Risk (03/13/2024)  Financial Resource Strain: Low Risk (12/08/2023)  Physical Activity: Inactive (12/08/2023)  Social Connections: Socially Isolated (04/01/2024)  Stress: Stress Concern Present (12/08/2023)  Tobacco Use: Medium Risk (04/01/2024)  Health Literacy: Adequate Health Literacy (05/15/2023)   SDOH Interventions:     Readmission Risk Interventions     No data to display

## 2024-04-02 NOTE — Progress Notes (Signed)
 Informed Kerry Powell of no urine output, bladder scanned for 150. Obtained verbal order to in and out cath if over 350 in 6 hours,

## 2024-04-02 NOTE — Progress Notes (Signed)
 Pts BP 94/60, pt very sleepy, and unable to swallow scheduled morning meds. Dr. Mariah notified. No orders received.

## 2024-04-02 NOTE — Progress Notes (Signed)
 [facility] Post-Op Note   Patient name: Kerry Powell MRN: [id number] DOB: 05/16/1945 Date: 04/02/2024  1 Day Post-Op s/p conversion to left total hip arthroplasty, posterior approach  Subjective:  Patient resting comfortably in bed this afternoon.  Reports somnolence and fatigue.  Reports continued hip pain, primarily incisional, describes as burning sensation.  Has been receiving pain medication regularly -did not have medications this morning due to hypotension and fatigue causing difficulty with swallowing per nurse concern.  Visit with PT earlier today, patient was able to stand with rolling walker with no physical assist.  Consult placed to hospitalist team and visit was performed earlier today.  Patient did have no urine output as of yesterday evening, verbal order given at 1 AM for in-and-out cath, including repeat if over 350 in 6 hours.  Denies new or worsening pain. Denies numbness/tingling. Denies systemic signs of illness including fever, chills, nausea, vomiting, chest pain, shortness of breath.    Vitals:   04/02/24 1027 04/02/24 1114  BP: (!) 89/57 94/60  Pulse: 93 87  Resp:  16  Temp:  97.9 F (36.6 C)  SpO2: 97% 97%    Physical Exam:  GEN: NAD, awake, follows commands for exam Somnolence.  Patient answers questions, but will frequently close eyes and report fatigue  Left LE: Compartments soft and compressible Dressing clean, dry and intact. No surrounding edema, erythema, streaking redness, or ecchymosis. Moderate tenderness to palpation in left groin and along incision site, with extension to buttocks/posterior thigh. Pain-free passive short arc ROM of hip, knee, ankle Palpable dorsalis pedis/posterior tibialis pulses Brisk cap refill x 5 toes Sensation intact to light touch to Deep peroneal/Superficial peroneal/Tibial/Saphenous/Sural nerve distributions Motor function intact to EHL/FHL/Tibialis anterior/Gastrocnemius    Imaging:    LEFT FEMUR PORTABLE 2 VIEWS - 04/01/2024 IMPRESSION: Interval left hip arthroplasty with long femoral stem and cerclage wire. No immediate postoperative complication.  Assessment: 1 Day Post-Op s/p conversion to left total hip arthroplasty, posterior approach  Plan: -Patient with fatigue/somnolence. Hospitalist team consulted. Suspected etiology secondary to pain medication.  -Weightbearing status: WBAT -Monitor H/H: 9.3/29.7 -PT/OT: originally scheduled bid visits, patient requested every day. Visited today. Will continue to progress. -DVT ppx: 81mg  ASA bid x 6 weeks (patient also on Plavix  per PCP) -Pain control: Concern pain regimen reason for somnolence; hospital team discontinued oxycodone , switched to PO dilaudid , and will have IV dilaudid  only for severe breakthrough pain -ABX: Received 24 hours of Ancef  -Disposition: Patient with post-op hypotension and somnolence. Hospital team consulted (thank you for your assistance in taking care of this patient). Will continue to monitor. Placed order for social work to assist in transfer of care planning.   Lucie Stabs, PA-C Orth[facility] [place]  1:48 PM 04/02/24

## 2024-04-02 NOTE — Op Note (Signed)
 Patient:  Kerry Powell MRN:  [id number] Date of birth:  1945/05/29    Date of surgery:  04/01/2024  PRE-OPERATIVE DIAGNOSIS:  Left hip arthritis  POST-OPERATIVE DIAGNOSIS:  Left hip arthritis  PROCEDURE:  Left total hip arthroplasty  SURGEON:  Surgeons and Role:    * Mariah Thamas RAMAN, MD - Primary  ASSISTANTS: Circulator: Vonzell Mitzie SQUIBB, RN Relief Circulator: Jeralyn Charlott BRAVO, RN Scrub Person: Kerry Cordella HERO Vendor Representative : Shona Comings RN First Assistant: Justina Camelia HERO, RN OR Clinical Technician: Aurelia Aliene RAMAN   ANESTHESIOLOGIST: Anesthesiologist: Lanice Redell POUR, MD CRNA: Dominica Krabbe, CRNA; Strickland, Bevin R, CRNA  ANESTHESIA:  General  EBL:  300 mL   SPECIMEN:  * No specimens in log *  IMPLANTS:  Implant Name Type Inv. Item Serial No. Manufacturer Lot No. LRB No. Used Action  SHELL ACET G7 4H 58 SZG HIP - [id number] Shell SHELL ACET G7 4H 58 SZG HIP  ZIMMER RECON(ORTH,TRAU,BIO,SG) [id number] Left 1 Implanted  SCREW BONE 6.5X25 - [id number] Screw SCREW BONE 6.5X25  ZIMMER RECON(ORTH,TRAU,BIO,SG) [id number] Left 1 Implanted  SCREW BN 30X6.5XST STRL ACTB - [id number] Screw SCREW BN 30X6.5XST STRL ACTB  ZIMMER RECON(ORTH,TRAU,BIO,SG) [id number] Left 1 Implanted  LINER DUAL MOBIL G7 46 SZG - [id number] Liner LINER DUAL MOBIL G7 46 SZG  ZIMMER RECON(ORTH,TRAU,BIO,SG) [id number] Left 1 Implanted  CABLE CERLAGE W/CRIMP 1.8 - [id number] Cable CABLE CERLAGE W/CRIMP 1.8  ZIMMER RECON(ORTH,TRAU,BIO,SG) [id number] Left 1 Implanted  STEM REV MODLAR ARCOS 18X190MM - [id number] Stem STEM REV MODLAR ARCOS 18X190MM  ZIMMER RECON(ORTH,TRAU,BIO,SG) [id number] Left 1 Implanted  FEMORAL MOD ARCOS SZA - [id number] Hips FEMORAL MOD ARCOS SZA  ZIMMER RECON(ORTH,TRAU,BIO,SG) [id number] Left 1 Implanted  SLEEVE HIP BIOLOX - OFFSET - [id number] Sleeve SLEEVE HIP BIOLOX - OFFSET  ZIMMER RECON(ORTH,TRAU,BIO,SG) [id number] Left 1 Implanted  HEAD CERAMIC DELTA 16/18 - [id number]  Head HEAD CERAMIC DELTA 16/18  ZIMMER RECON(ORTH,TRAU,BIO,SG) [id number] Left 1 Implanted  dual mobility vivacit-e vitamin e highly crosslinked polyethylene bearing 28mm id, 46mmod, g    ZIMMER [id number] Left 1 Implanted    INDICATIONS: The patient is a 79 y.o. female who has been suffering from left hip pain and has failed all reasonable conservative management of the condition. The procedure, alternatives, risks, and limitations in this individual case have been very carefully discussed with the patient. All questions have been thoroughly answered, and the patient understands the surgery indicated. The patient has opted to proceed with removal of hardware and conversion to total hip arthroplasty. The correct side was marked and the patient signed the consent in the preoperative area.  PROCEDURE DETAILS: The patient was brought into the operating room and was placed on the operative table. Anesthesia was administered by the anesthesiology team. Preoperative antibiotics were administered. The patient was then positioned in a lateral decubitus position with the left side up. The left lower extremity was prepped and draped in a sterile fashion. After review of allergies, time-out was performed. A 35 cm in length incision was made over the greater trochanter. This was angled posteriorly. Sharp dissection was taken down to the tensor fasciae latae.  The tensor fasciae latae was incised with the use of scissors, in line with the incision. Gluteus maximus was separated. All bleeders were coagulated with the use of a bovie, and hemostasis was achieved. The bursa around the hip was identified, and swept out of the way of the posterior hip joint with retractors. The short external rotators and posterior capsule  were taken down in one sleeve with electrocautery, and were tagged with two #2 Fiberwire. The gluteus maximus tendon was released for exposure. The hip was then dislocated. At this time the proximal nail was  exposed with a rongeur and a curette and the proximal locking screw was removed. Another screwdriver was then used to remove the femoral head screw. The extractor was then attached to the proximal nail and the distal lock screw was removed. The nail was then removed with a slap hammer. With the use of a reciprocating saw, the femoral neck cut was performed. The acetabulum was then visualized and debrided from soft tissues and osteophytes. Reaming was initiated and completed for a 58 mm diameter cup without complications. The acetabular component was then impacted in the acetabulum. Two screws were placed. The dual mobility liner was placed and impacted into the acetabular cup. Our attention was then turned to the femur. The fracture nonunion was identified and remaining nonunion femoral neck was removed with a rongeur. The femoral canal was accessed. The reaming process was initiated and completed for a size 18 mm x 190 mm distal body. A prophylactic cable was placed around the proximal femur and the final distal body was impacted into place. Reaming was then performed for the proximal body and the 50 mm trial proximal body was placed and secured with a screw, and a -6.5 mm dual mobility was trialed. There was difficulty reducing the trials and the decision was made to put in the final implants as there was concern that once reduced we would not be able to dislocated the hip again. The final proximal body and dual mobility head with a -6.5 mm neck were placed and the hip was reduced. At this time it was noted that there was some difficulty with extension of the hip; hip was dislocated and the iliopsoas tendon as well as the anterior capsule were released. The hip was reduced again. The hip did not demonstrate any obvious instability or dislocation with the hip flexed at 90 degrees and internally rotated to 45 degree. There was no instability with maximal extension which was approximately -10 degrees. There was also  no instability in the position of sleep. Leg lengths were checked and found to be approximately equal. The wound was checked for bleeding, and hemostasis was achieved again. The wound was irrigated with normal saline. The capsule and external rotators were re-attached to the posterior greater trochanter using drill holes. Exparel  diluted with normal saline was injected periarticularly. The wound was closed in layers. The tensor fascia latae was closed with #1 vicryl and #1 barbed suture, and the fat and dermis were closed with #1 and 2-0 Vicryl. Staples were used for the skin. A sterile dressing was placed, and a hip abduction pillow was put in between the legs.   The patient recovered from anesthesia without complications, and was taken to the postoperative area in a stable condition.  PATIENT DISPOSITION:  PACU - hemodynamically stable.

## 2024-04-02 NOTE — Evaluation (Addendum)
 Physical Therapy Evaluation Patient Details Name: Kerry Powell MRN: [id number] DOB: 1945-06-20 Today's Date: 04/02/2024  History of Present Illness  Pt is a 79 yo F diagnosed with mechanical complication associated with orthopedic device from prior LLE cephalomedullary nailing and is s/p conversion to L THA.  PMH also includes: HTN, CAD, PVD, DM, CHF, MI, COPD, and bipolar disorder.   Clinical Impression  Pt lethargic initially but responded well to voice and became more alert as the session progressed.  Pt required physical assistance with bed mobility and transfers along with cuing for proper sequencing for hip precaution compliance.  Once in standing pt able to maintain static standing balance with the RW with no physical assist and with cuing was able to laterally advance her R foot a few inches but could not advance her LLE.  Pt will benefit from continued PT services upon discharge to safely address deficits listed in patient problem list for decreased caregiver assistance and eventual return to PLOF. Addendum: Pt requested frequency of 7x/wk instead of BID at this time.         If plan is discharge home, recommend the following: Two people to help with walking and/or transfers;A lot of help with bathing/dressing/bathroom;Assistance with cooking/housework;Direct supervision/assist for medications management;Assist for transportation;Help with stairs or ramp for entrance   Can travel by private vehicle   No    Equipment Recommendations Wheelchair cushion (measurements PT)  Recommendations for Other Services       Functional Status Assessment Patient has had a recent decline in their functional status and demonstrates the ability to make significant improvements in function in a reasonable and predictable amount of time.     Precautions / Restrictions Precautions Precautions: Fall;Posterior Hip Precaution Booklet Issued: Yes (comment) Restrictions Weight Bearing Restrictions Per  Provider Order: Yes LLE Weight Bearing Per Provider Order: Weight bearing as tolerated      Mobility  Bed Mobility Overal bed mobility: Needs Assistance Bed Mobility: Supine to Sit, Sit to Supine     Supine to sit: Max assist Sit to supine: Max assist   General bed mobility comments: Max A for BLE and trunk control    Transfers Overall transfer level: Needs assistance Equipment used: Rolling walker (2 wheels) Transfers: Sit to/from Stand Sit to Stand: Mod assist, From elevated surface           General transfer comment: Multi-modal cues for sequencing for hip precaution compliance    Ambulation/Gait               General Gait Details: Pt able to shift RLE laterally a few inches while in standing with heavy lean on the RW for support but unable to advance LLE, no true ability to ambulate at this time  Stairs            Wheelchair Mobility     Tilt Bed    Modified Rankin (Stroke Patients Only)       Balance Overall balance assessment: Needs assistance Sitting-balance support: Feet supported, Single extremity supported Sitting balance-Leahy Scale: Good     Standing balance support: Bilateral upper extremity supported, Reliant on assistive device for balance Standing balance-Leahy Scale: Fair                               Pertinent Vitals/Pain Pain Assessment Pain Assessment: 0-10 Pain Score: 10-Worst pain ever Pain Descriptors / Indicators: Aching, Sore Pain Intervention(s): Repositioned, Monitored during session  Home Living Family/patient expects to be discharged to:: Private residence Living Arrangements: Spouse/significant other;Children Available Help at Discharge: Family;Available 24 hours/day Type of Home: House Home Access: Ramped entrance       Home Layout: Two level;Able to live on main level with bedroom/bathroom Home Equipment: Grab bars - tub/shower;Shower Counsellor (2 wheels);Cane - single  point;Rollator (4 wheels);Hand held shower head;Transport chair;Wheelchair - manual Additional Comments: 2L O2 baseline    Prior Function Prior Level of Function : Independent/Modified Independent;Driving             Mobility Comments: Non-ambulatory since August of 2025, performs SPT's with Mod Ind with a RW to/from wheelchair, multiple fall history but no falls since surgery in August ADLs Comments: family assists with ADLs and IADLs     Extremity/Trunk Assessment   Upper Extremity Assessment Upper Extremity Assessment: Generalized weakness    Lower Extremity Assessment Lower Extremity Assessment: Generalized weakness;LLE deficits/detail LLE: Unable to fully assess due to pain       Communication   Communication Communication: No apparent difficulties    Cognition Arousal: Lethargic Behavior During Therapy: WFL for tasks assessed/performed   PT - Cognitive impairments: No family/caregiver present to determine baseline                       PT - Cognition Comments: Slow at times to process questions and commands but improved grossly as the session progressed Following commands: Impaired Following commands impaired: Follows one step commands with increased time     Cueing Cueing Techniques: Verbal cues, Visual cues, Tactile cues     General Comments      Exercises Other Exercises Other Exercises: Hip precaution education verbally and during functional mobility, handout provided   Assessment/Plan    PT Assessment Patient needs continued PT services  PT Problem List Decreased strength;Decreased activity tolerance;Decreased balance;Decreased mobility;Decreased knowledge of use of DME;Decreased safety awareness;Decreased knowledge of precautions;Pain       PT Treatment Interventions DME instruction;Gait training;Functional mobility training;Therapeutic activities;Therapeutic exercise;Balance training;Patient/family education    PT Goals (Current goals  can be found in the Care Plan section)  Acute Rehab PT Goals Patient Stated Goal: To return home PT Goal Formulation: With patient Time For Goal Achievement: 04/15/24 Potential to Achieve Goals: Good    Frequency 7X/week     Co-evaluation               AM-PAC PT 6 Clicks Mobility  Outcome Measure Help needed turning from your back to your side while in a flat bed without using bedrails?: Total Help needed moving from lying on your back to sitting on the side of a flat bed without using bedrails?: Total Help needed moving to and from a bed to a chair (including a wheelchair)?: Total Help needed standing up from a chair using your arms (e.g., wheelchair or bedside chair)?: A Lot Help needed to walk in hospital room?: Total Help needed climbing 3-5 steps with a railing? : Total 6 Click Score: 7    End of Session Equipment Utilized During Treatment: Gait belt;Oxygen Activity Tolerance: Patient tolerated treatment well Patient left: in bed;with call bell/phone within reach;with bed alarm set;with SCD's reapplied;Other (comment) (Abd wedge created with two pillows) Nurse Communication: Mobility status;Weight bearing status;Precautions PT Visit Diagnosis: Unsteadiness on feet (R26.81);Other abnormalities of gait and mobility (R26.89);Muscle weakness (generalized) (M62.81);Pain Pain - Right/Left: Left Pain - part of body: Hip    Time: 9061-8990 PT Time Calculation (min) (ACUTE ONLY): 31  min   Charges:   PT Evaluation $PT Eval Moderate Complexity: 1 Mod PT Treatments $Therapeutic Activity: 8-22 mins PT General Charges $$ ACUTE PT VISIT: 1 Visit    D. Scott Sharne Linders PT, DPT 04/02/24, 10:39 AM

## 2024-04-02 NOTE — [place] FL2 (Signed)
 " [place]  MEDICAID FL2 LEVEL OF CARE FORM     IDENTIFICATION  Patient Name: Kerry Powell Birthdate: January 31, 1946 Sex: female Admission Date (Current Location): 04/01/2024  [facility] and [place] Number:  Chiropodist and Address:  [facility], [address], [place], [name] [address]      Provider Number: [id number]  Attending Physician Name and Address:  Mariah Thamas RAMAN, MD  Relative Name and Phone Number:  Nihira, Puello, Emergency Contact  [phone] (Mobile)    Current Level of Care: Hospital Recommended Level of Care: Skilled Nursing Facility Prior Approval Number:    Date Approved/Denied:   PASRR Number: Pending  Discharge Plan: SNF    Current Diagnoses: Patient Active Problem List   Diagnosis Date Noted   S/P hip replacement, left 04/01/2024   Age-related osteoporosis without current pathological fracture 03/09/2024   Closed right scapular fracture 01/28/2024   Acute hypoxic respiratory failure (HCC) 12/20/2023   History of femur fracture 12/09/2023   Orthostatic hypotension 11/22/2023   Protein-calorie malnutrition, severe 10/31/2023   Recurrent falls 10/21/2023   Closed comminuted intertrochanteric fracture of proximal end of left femur (HCC) 10/21/2023   Chronic respiratory failure with hypoxia (HCC) 10/16/2023   Sterile pyuria 10/07/2023   Hoarseness, chronic 06/05/2023   Hypoxia 03/06/2023   CKD (chronic kidney disease) stage 4, GFR 15-29 ml/min (HCC) 02/11/2023   Chronic diastolic CHF (congestive heart failure) (HCC) 02/11/2023   PAD (peripheral artery disease) 05/15/2022   Pre-syncope 02/01/2022   Peroneal tendinitis 12/04/2021   Pain in joint of left shoulder 11/16/2021   Osteoarthritis of knees, bilateral 05/04/2021   Pain in joint of right knee 05/04/2021   Osteoarthritis of left knee 05/03/2021   B12 deficiency 08/03/2020   Diabetic polyneuropathy associated with type 2 diabetes mellitus (HCC) 07/29/2020    Diastolic dysfunction without heart failure 01/20/2020   Pain of left thumb 12/24/2019   Pain of right thumb 12/24/2019   Polyarthralgia 12/07/2019   Anemia 12/07/2019   Fatigue 12/07/2019   Acquired thrombophilia 07/14/2019   CAD (coronary artery disease), native coronary artery    Cervical spine pain 06/16/2019   Pleural plaque 04/27/2019   Muscle spasm 11/14/2018   SOB (shortness of breath) 04/17/2018   Vitamin D  deficiency 03/12/2018   Thoracic aortic aneurysm 02/06/2018   Pain in left knee 01/14/2018   Sleep difficulties 09/09/2017   Depression 07/01/2017   Dizziness 06/10/2017   H/O renal cell carcinoma 05/23/2017   History of kidney cancer 05/23/2017   Cervical radiculopathy 10/31/2016   Hyperlipidemia associated with type 2 diabetes mellitus (HCC) 06/25/2016   Occipital neuralgia 05/09/2016   Anxiety 07/26/2015   Chronic neck pain 04/30/2015   Type 2 diabetes mellitus with stage 4 chronic kidney disease, without long-term current use of insulin  (HCC) 04/27/2015   History of CVA (cerebrovascular accident) 04/26/2015   Gout 04/26/2015   Hypertension associated with type 2 diabetes mellitus (HCC) 04/26/2015   Snoring 04/26/2015   Bilateral leg edema 04/26/2015   Dysphagia 04/26/2015   GERD (gastroesophageal reflux disease) 04/26/2015   History of colonic polyps 05/29/2007   HIATAL HERNIA 04/07/2007   RESTLESS LEG SYNDROME 04/04/2007   RHINOSINUSITIS, 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   COPD (chronic obstructive pulmonary disease) (HCC) 04/04/2007   IRRITABLE BOWEL SYNDROME 04/04/2007   Osteoarthritis, diffuse 04/04/2007    Orientation RESPIRATION BLADDER Height & Weight     Self, Time, Situation, Place  O2 Indwelling catheter Weight: 167 lb (75.8 kg) Height:  5' 5.5 (166.4  cm)  BEHAVIORAL SYMPTOMS/MOOD NEUROLOGICAL BOWEL NUTRITION STATUS      Continent Diet  AMBULATORY STATUS COMMUNICATION OF NEEDS Skin   Extensive Assist Verbally Surgical wounds                        Personal Care Assistance Level of Assistance              Functional Limitations Info             SPECIAL CARE FACTORS FREQUENCY  PT (By licensed PT), OT (By licensed OT)     PT Frequency: 7X OT Frequency: 7X            Contractures Contractures Info: Not present    Additional Factors Info  Code Status, Allergies Code Status Info: FULL Allergies Info: Gabapentin Medium Contraindication Other (See Comments) Fluid retention  Spiriva  Respimat (tiotropium Bromide ) Medium  Other (See Comments) Tongue swelling  Symbicort  (budesonide -formoterol  Fumarate) Medium Allergy Other (See Comments) Tongue swelling  Clindamycin  Low Allergy Rash   Sulfa Antibiotics Low Intolerance Nausea And Vomiting           Current Medications (04/02/2024):  This is the current hospital active medication list Current Facility-Administered Medications  Medication Dose Route Frequency Provider Last Rate Last Admin   albuterol  (PROVENTIL ) (2.5 MG/3ML) 0.083% nebulizer solution 2.5 mg  2.5 mg Nebulization Daily PRN Bigby, Thamas RAMAN, MD       allopurinol  (ZYLOPRIM ) tablet 300 mg  300 mg Oral Daily Bigby, Thamas RAMAN, MD       aspirin  chewable tablet 81 mg  81 mg Oral BID Mariah Thamas RAMAN, MD   81 mg at 04/02/24 1432   bisacodyl  (DULCOLAX) EC tablet 5 mg  5 mg Oral Daily PRN Bigby, Thamas RAMAN, MD       cholecalciferol  (VITAMIN D3) 25 MCG (1000 UNIT) tablet 2,000 Units  2,000 Units Oral Daily Bigby, Thamas RAMAN, MD       clopidogrel  (PLAVIX ) tablet 75 mg  75 mg Oral Daily Bigby, Thamas RAMAN, MD   75 mg at 04/02/24 1432   DULoxetine  (CYMBALTA ) DR capsule 60 mg  60 mg Oral Daily Bigby, Thamas RAMAN, MD       ferrous sulfate  tablet 325 mg  325 mg Oral QODAY Bigby, Ulric S, MD       fluticasone  (FLONASE ) 50 MCG/ACT nasal spray 2 spray  2 spray Each Nare Daily PRN Bigby, Thamas RAMAN, MD       HYDROmorphone  (DILAUDID ) injection 0.5 mg  0.5 mg Intravenous Q4H PRN Laurita Manor T, MD       HYDROmorphone  (DILAUDID ) tablet 1 mg  1 mg Oral Q6H  PRN Laurita Manor DASEN, MD       menthol  (CEPACOL) lozenge 3 mg  1 lozenge Oral PRN Bigby, Thamas RAMAN, MD       Or   phenol (CHLORASEPTIC) mouth spray 1 spray  1 spray Mouth/Throat PRN Bigby, Ulric S, MD       metoCLOPramide  (REGLAN ) tablet 5-10 mg  5-10 mg Oral Q8H PRN Bigby, Ulric S, MD       Or   metoCLOPramide  (REGLAN ) injection 5-10 mg  5-10 mg Intravenous Q8H PRN Bigby, Thamas RAMAN, MD       metoprolol  succinate (TOPROL -XL) 24 hr tablet 12.5 mg  12.5 mg Oral Daily Bigby, Ulric S, MD       midodrine  (PROAMATINE ) tablet 5 mg  5 mg Oral TID WC Laurita Manor DASEN, MD   5  mg at 04/02/24 1432   montelukast  (SINGULAIR ) tablet 10 mg  10 mg Oral QHS Mariah Thamas RAMAN, MD   10 mg at 04/01/24 2107   ondansetron  (ZOFRAN ) tablet 4 mg  4 mg Oral Q6H PRN Mariah Thamas RAMAN, MD       Or   ondansetron  (ZOFRAN ) injection 4 mg  4 mg Intravenous Q6H PRN Bigby, Thamas RAMAN, MD       pantoprazole  (PROTONIX ) EC tablet 40 mg  40 mg Oral BID AC Bigby, Thamas RAMAN, MD   40 mg at 04/02/24 1627   polyethylene glycol (MIRALAX  / GLYCOLAX ) packet 17 g  17 g Oral BID Bigby, Thamas RAMAN, MD       rosuvastatin  (CRESTOR ) tablet 10 mg  10 mg Oral Daily Bigby, Thamas RAMAN, MD       senna (SENOKOT) tablet 8.6 mg  1 tablet Oral Daily Bigby, Ulric S, MD       senna-docusate (Senokot-S) tablet 1 tablet  1 tablet Oral QHS PRN Bigby, Ulric S, MD       sodium chloride  (OCEAN) 0.65 % nasal spray 1 spray  1 spray Each Nare PRN Bigby, Thamas RAMAN, MD         Discharge Medications: Please see discharge summary for a list of discharge medications.  Relevant Imaging Results:  Relevant Lab Results:   Additional Information [id number]  Janece Laidlaw L Jody Silas, LCSW     "

## 2024-04-02 NOTE — Consult Note (Signed)
 Initial Consultation Note   Patient: Kerry Powell FMW:[id number] DOB: 1945-03-28 PCP: Geofm Glade PARAS, MD DOA: 04/01/2024 DOS: the patient was seen and examined on 04/02/2024 Primary service: Mariah Thamas RAMAN, MD  Referring physician: Dr. Mariah Reason for consult: Sleepiness, low BP  Assessment/Plan:  Somnolent - Neurological exam nonfocal, clinically suspect somnolent secondary to pain medication/narcotics.  Given that patient has a history of CKD stage IIIb, will change oxycodone  to p.o. Dilaudid  and we will caper IV Dilaudid  for breakthrough pain. -Other DDx, will check one-time dose of lactic acid, TSH and cortisol level also sent.  Hypotension History of orthostatic hypotension - Appears to be euvolemic - Postop hypotension again suspect to be related to surgical pain and narcotics. -Restart midodrine  - Other DDx, will check troponin x 1 day postop EKG  CKD stage IIIb -Euvolemic, creatinine stable  COPD - No symptoms or signs of acute exacerbation - Incentive spirometry  Chronic HFpEF - Euvolemic - Continue small dose of metoprolol   IIDM - Outpatient [facility]  therapy    TRH will continue to follow the patient.  HPI: Kerry Powell is a 79 y.o. female with past medical history of COPD with chronic hypoxic respiratory failure on 2 L as needed, CAD, chronic orthostatic hypotension, PAD, HLD, CKD stage IIIb, presented with elective left hip surgery.  Patient will need to procedure well however she was found to be very sleepy since surgery yesterday afternoon.  I reviewed patient's medication administration list and showed patient received a total of 3 times oxycodone  and 2 times of Dilaudid  injection since yesterday afternoon.  Given that the patient has history of CKD stage IIIb, somewhat suspect side effect of narcotics.  Review of Systems: As mentioned in the history of present illness. All other systems reviewed and are negative. Past Medical History:  Diagnosis Date    (HFpEF) heart failure with preserved ejection fraction (HCC)    Allergy    Anemia    Anxiety    Aortic atherosclerosis    Arthritis    Atrial tachycardia    BACK PAIN, CHRONIC 04/04/2007   BIPOLAR AFFECTIVE DISORDER 04/04/2007   CAD (coronary artery disease), native coronary artery    Cerebral microvascular disease    Chronic neck pain 04/30/2015   CKD (chronic kidney disease), stage III (HCC)    COPD (chronic obstructive pulmonary disease) (HCC)    Cough 04/26/2015   Depression 07/01/2017   Dizziness 06/10/2017   Dysphagia 04/26/2015   DYSPNEA 12/24/2007   Elevated TSH 08/09/2015   Fracture of right scapular body    GASTRIC ULCER, ACUTE 04/07/2007   GASTRITIS 04/07/2007   GERD (gastroesophageal reflux disease)    H/O renal cell carcinoma 05/23/2017   Dx 2010, s/p cryoablation Follows with urology - Dr [name]   HIATAL HERNIA 04/07/2007   History of CVA (cerebrovascular accident) 04/26/2015   History of gout 04/26/2015   Hyperlipidemia    Hypertension    Hypoxia 03/06/2023   Irritable bowel syndrome 04/04/2007   Kidney tumor (benign)    Long term current use of clopidogrel     Occipital neuralgia 05/09/2016   PAD (peripheral artery disease)    Personal history of colonic adenoma 06/07/2012   POSTNASAL DRIP SYNDROME 12/29/2007   RESTLESS LEG SYNDROME 04/04/2007   Sleep difficulties 09/09/2017   Snoring 04/26/2015   Status post bilateral cataract extraction    Stroke [facility])    x2 2011   T2DM (type 2 diabetes mellitus) (HCC) 03/2015   Thoracic aortic aneurysm 02/06/2018  TIA (transient ischemic attack)    TOBACCO ABUSE 12/17/2007   Trigeminal neuralgia of right side of face 02/08/2016   Vitamin D  deficiency 03/12/2018   Past Surgical History:  Procedure Laterality Date   ABDOMINAL HYSTERECTOMY     APPENDECTOMY     APPLICATION OF WOUND VAC Left 10/09/2023   Procedure: APPLICATION, WOUND VAC;  Surgeon: [name], MD;  Location: [facility] OR;  Service: Orthopedics;   Laterality: Left;   CATARACT EXTRACTION     COLONOSCOPY     CYSTOCELE REPAIR     ESOPHAGOGASTRODUODENOSCOPY     FRACTURE SURGERY  2007   INCISION AND DRAINAGE OF DEEP ABSCESS, CALF Left 10/09/2023   Procedure: INCISION AND DRAINAGE OF DEEP ABSCESS, CALF;  Surgeon: [name], MD;  Location: [facility] OR;  Service: Orthopedics;  Laterality: Left;   INTRAMEDULLARY (IM) NAIL INTERTROCHANTERIC Left 10/24/2023   Procedure: FIXATION, FRACTURE, INTERTROCHANTERIC, WITH INTRAMEDULLARY ROD;  Surgeon: [name], MD;  Location: [facility] OR;  Service: Orthopedics;  Laterality: Left;   KIDNEY SURGERY     benign tumor removal   ORTHOPEDIC SURGERY     Wrist & elbow   POLYPECTOMY     RECTOCELE REPAIR     TONSILECTOMY, ADENOIDECTOMY, BILATERAL MYRINGOTOMY AND TUBES Bilateral 1968   UPPER GASTROINTESTINAL ENDOSCOPY     Social History:  reports that she quit smoking about 14 years ago. Her smoking use included cigarettes. She started smoking about 60 years ago. She has a 46 pack-year smoking history. She has been exposed to tobacco smoke. She has never used smokeless tobacco. She reports that she does not drink alcohol  and does not use drugs.  Allergies[1]  Family History  Problem Relation Age of Onset   Congestive Heart Failure Mother    Cancer Mother        ovarian   Asthma Mother    Heart disease Mother    Heart disease Father    Diabetes Father    Kidney disease Father    Hyperlipidemia Father    Stroke Father    Diabetes Sister    Kidney disease Sister    Miscarriages / Stillbirths Sister    Arthritis Maternal Grandmother    Hyperlipidemia Maternal Grandmother    Hypertension Maternal Grandmother    Obesity Maternal Grandmother    Arthritis Paternal Grandfather    Hypertension Paternal Grandfather    Kidney disease Paternal Grandfather    Diabetes Paternal Aunt    Diabetes Paternal Uncle    Learning disabilities Brother    Anxiety disorder Son    Diabetes Son    Learning disabilities  Son    Depression Maternal Aunt    Colon cancer Neg Hx    Colon polyps Neg Hx    Esophageal cancer Neg Hx    Rectal cancer Neg Hx    Stomach cancer Neg Hx     Prior to Admission medications  Medication Sig Start Date End Date Taking? Authorizing Provider  acetaminophen  (TYLENOL ) 500 MG tablet Take 1,000 mg by mouth every 6 (six) hours as needed.   Yes [provider]  albuterol  (PROVENTIL ) (2.5 MG/3ML) 0.083% nebulizer solution USE 1 VIAL IN NEBULIZER EVERY 6 HOURS AND AS NEEDED FOR SHORT BREATH/WHEEZE.  Generic:  Ventolin  Patient taking differently: Take 2.5 mg by nebulization daily as needed for wheezing or shortness of breath. Maybe 3 times weekly 09/30/23  Yes Meade Verdon RAMAN, MD  allopurinol  (ZYLOPRIM ) 300 MG tablet Take 1 tablet by mouth once daily 09/02/23  Yes Burns,  Glade PARAS, MD  cholecalciferol  (VITAMIN D3) 25 MCG (1000 UNIT) tablet Take 2,000 Units by mouth daily.   Yes [provider]  clopidogrel  (PLAVIX ) 75 MG tablet Take 1 tablet by mouth once daily 09/02/23  Yes Burns, Glade PARAS, MD  DULoxetine  (CYMBALTA ) 30 MG capsule Take 2 capsules by mouth once daily 03/06/24  Yes Burns, Glade PARAS, MD  ferrous sulfate  325 (65 FE) MG tablet Take 325 mg by mouth See admin instructions. Every 3 days   Yes [provider]  fluticasone  (FLONASE ) 50 MCG/ACT nasal spray Place 2 sprays into both nostrils daily. 10/03/23  Yes Soldatova, Liuba, MD  HYDROcodone -acetaminophen  (NORCO/VICODIN) 5-325 MG tablet Take 1-2 tablets by mouth every 8 (eight) hours as needed for severe pain (pain score 7-10) (Chronic left leg pain). 03/24/24  Yes Geofm Glade PARAS, MD  levocetirizine (XYZAL ) 5 MG tablet SMARTSIG:1 Tablet(s) By Mouth Every Evening 12/04/23  Yes [provider]  metoprolol  succinate (TOPROL -XL) 25 MG 24 hr tablet Take 0.5 tablets (12.5 mg total) by mouth daily. 03/24/24  Yes Burns, Glade PARAS, MD  montelukast  (SINGULAIR ) 10 MG tablet TAKE 1 TABLET BY MOUTH ONCE DAILY AT BEDTIME 07/25/23   Yes Burns, Glade PARAS, MD  pantoprazole  (PROTONIX ) 40 MG tablet TAKE 1 TABLET BY MOUTH TWICE DAILY BEFORE A MEAL 02/17/24  Yes Burns, Glade PARAS, MD  polyethylene glycol (MIRALAX  / GLYCOLAX ) 17 g packet Take 17 g by mouth 2 (two) times daily. 10/31/23  Yes Christobal Guadalajara, MD  rosuvastatin  (CRESTOR ) 10 MG tablet Take 1 tablet (10 mg total) by mouth daily. 12/26/23  Yes Gonfa, Taye T, MD  sodium chloride  (OCEAN) 0.65 % SOLN nasal spray Place 1 spray into both nostrils as needed for congestion. 07/29/23  Yes Meade Verdon RAMAN, MD  Blood Glucose Monitoring Suppl (ONETOUCH VERIO FLEX SYSTEM) w/Device KIT 1 kit by Does not apply route as directed. Dx codes: E11.22, N18.4 Type 2 diabetes with stage 4 chronic kidney disease without long term insulin  01/30/24   Geofm Glade PARAS, MD  Blood Glucose Monitoring Suppl DEVI May substitute to any manufacturer covered by patient's insurance. Patient request Accu-check meter and testing suppliles 12/06/23   Geofm Glade PARAS, MD  Glucose Blood (BLOOD GLUCOSE TEST STRIPS) STRP May substitute to any manufacturer covered by patient's insurance. Patient to test glucose 2-3 times daily as needed 12/06/23   Geofm Glade PARAS, MD  glucose blood test strip Use as instructed 01/30/24   Geofm Glade PARAS, MD  Lancet Device MISC May substitute to any manufacturer covered by patient's insurance.  Patient to test glucose 2-3 times daily as needed 12/06/23   Geofm Glade PARAS, MD  Lancets [facility] ULTRASOFT) lancets Use as instructed 01/30/24   Geofm Glade PARAS, MD  MOUNJARO  5 MG/0.5ML Pen INJECT 0.5 ML SUBCUTANEOUSLY  ONCE A WEEK 03/27/24   Geofm Glade PARAS, MD    Physical Exam: Vitals:   04/02/24 0415 04/02/24 0940 04/02/24 1027 04/02/24 1114  BP: 124/65 (!) 109/58 (!) 89/57 94/60  Pulse: 92 86 93 87  Resp: 16 16  16   Temp: (!) 97.4 F (36.3 C)   97.9 F (36.6 C)  TempSrc: Temporal   Oral  SpO2: 93% 95% 97% 97%  Weight:      Height:       Eyes: PERRL, lids and conjunctivae normal ENMT: Mucous  membranes are moist. Posterior pharynx clear of any exudate or lesions.Normal dentition.  Neck: normal, supple, no masses, no thyromegaly Respiratory: clear to auscultation bilaterally, no  wheezing, no crackles. Normal respiratory effort. No accessory muscle use.  Cardiovascular: Regular rate and rhythm, no murmurs / rubs / gallops. No extremity edema. 2+ pedal pulses. No carotid bruits.  Abdomen: no tenderness, no masses palpated. No hepatosplenomegaly. Bowel sounds positive.  Musculoskeletal: no clubbing / cyanosis. No joint deformity upper and lower extremities. Good ROM, no contractures. Normal muscle tone.  Skin: no rashes, lesions, ulcers. No induration Neurologic: CN 2-12 grossly intact. Sensation intact, DTR normal.  Muscle strength 5/5 on both sides Psychiatric: Sleepy, arousable to voice, AO x 3, Data Reviewed:  Recent hospitalization record from November 2025, blood work including BMP since last year reviewed.  Family Communication: None at bedside Primary team communication: Orthopedic surgery team Thank you very much for involving us  in the care of your patient.  Author: Cort ONEIDA Mana, MD 04/02/2024 12:30 PM  For on call review [email or web address].     [1]  Allergies Allergen Reactions   Gabapentin Other (See Comments)    Fluid retention    Spiriva  Respimat [Tiotropium Bromide ] Other (See Comments)    Tongue swelling   Symbicort  [Budesonide -Formoterol  Fumarate] Other (See Comments)    Tongue swelling   Clindamycin  Rash   Sulfa Antibiotics Nausea And Vomiting

## 2024-04-03 ENCOUNTER — Other Ambulatory Visit: Payer: Self-pay | Admitting: Physician Assistant

## 2024-04-03 DIAGNOSIS — R8290 Unspecified abnormal findings in urine: Secondary | ICD-10-CM

## 2024-04-03 LAB — URINE CULTURE

## 2024-04-03 LAB — URINALYSIS, W/ REFLEX TO CULTURE (INFECTION SUSPECTED)
Bacteria, UA: NONE SEEN
Bilirubin Urine: NEGATIVE
Glucose, UA: NEGATIVE mg/dL
Ketones, ur: NEGATIVE mg/dL
Nitrite: NEGATIVE
Protein, ur: 100 mg/dL — AB
Specific Gravity, Urine: 1.026 (ref 1.005–1.030)
WBC, UA: >50 WBC/hpf (ref 0–5)
pH: 5 (ref 5.0–8.0)

## 2024-04-03 LAB — CBC
HCT: 34.1 % — ABNORMAL LOW (ref 36.0–46.0)
Hemoglobin: 10.1 g/dL — ABNORMAL LOW (ref 12.0–15.0)
MCH: 27.3 pg (ref 26.0–34.0)
MCHC: 29.6 g/dL — ABNORMAL LOW (ref 30.0–36.0)
MCV: 92.2 fL (ref 80.0–100.0)
Platelets: 262 10*3/uL (ref 150–400)
RBC: 3.7 MIL/uL — ABNORMAL LOW (ref 3.87–5.11)
RDW: 17.2 % — ABNORMAL HIGH (ref 11.5–15.5)
WBC: 28 10*3/uL — ABNORMAL HIGH (ref 4.0–10.5)
nRBC: 0 % (ref 0.0–0.2)

## 2024-04-03 MED ORDER — CHLORHEXIDINE GLUCONATE CLOTH 2 % EX PADS: 6.0000 | MEDICATED_PAD | Freq: Every day | CUTANEOUS | Status: AC

## 2024-04-03 MED ORDER — POLYETHYLENE GLYCOL 3350 17 G PO PACK
34.0000 g | PACK | Freq: Two times a day (BID) | ORAL | Status: AC
  Administered 2024-04-03: 34 g via ORAL
  Filled 2024-04-03: qty 2

## 2024-04-03 NOTE — Plan of Care (Signed)
  Problem: Clinical Measurements: Goal: Postoperative complications will be avoided or minimized Outcome: Progressing   Problem: Pain Management: Goal: Pain level will decrease with appropriate interventions Outcome: Progressing   

## 2024-04-03 NOTE — Progress Notes (Signed)
 [facility] Post-Op Note   Patient name: Kerry Powell MRN: [id number] DOB: 1945/06/06 Date: 04/03/2024  2 Days Post-Op s/p conversion to left total hip arthroplasty, posterior approach   Subjective:  Patient resting comfortably in bed this morning. Cognition has improved. Reports significant improvement with pain relief with po Dilaudid  (received a dose just prior to visit). Patient continues to require catheterization for urine evacuation.  Denies new or worsening pain. Denies numbness/tingling. Denies systemic signs of illness including fever, chills, nausea, vomiting, chest pain, shortness of breath.   Patient reports return in her appetite. About to eat breakfast.  Excited to visit with physical therapy and attempt weightbearing.  Vitals:   04/03/24 0549 04/03/24 0737  BP: 137/69 129/67  Pulse: (!) 105 (!) 110  Resp: 18 18  Temp:  (!) 97.2 F (36.2 C)  SpO2: 94% 94%    Physical Exam:  GEN: NAD, alert, awake, follows commands for exam  Left LE: Compartments soft and compressible Dressing clean, dry and intact. Moderate tenderness with palpation. No edema, erythema, streaking redness, or ecchymosis. Pain-free passive short arc ROM of hip, knee, ankle. Minimal pain with log roll. Palpable dorsalis pedis/posterior tibialis pulses Brisk cap refill x 5 toes Sensation intact to light touch to Deep peroneal/Superficial peroneal/Tibial/Saphenous/Sural nerve distributions Motor function intact to EHL/FHL/Tibialis anterior/Gastrocnemius    Imaging: No new imaging since last visit Narrative & Impression  CLINICAL DATA:  Postop.   EXAM: LEFT FEMUR PORTABLE 2 VIEWS   COMPARISON:  Radiograph earlier today   FINDINGS: Previous proximal femoral hardware has been removed. Interval hip arthroplasty with long femoral stem and cerclage wire about the proximal femoral stem. Ghost tracks at site of previous femoral hardware. No evidence of acute fracture.  Recent postsurgical change includes air and edema in the soft tissues with overlying skin staples in place. Degenerative change of the knee with ossified intra-articular bodies posteriorly.   IMPRESSION: Interval left hip arthroplasty with long femoral stem and cerclage wire. No immediate postoperative complication.     Electronically Signed   By: Andrea Gasman M.D.   On: 04/01/2024 15:36     Assessment: 2 Days Post-Op s/p conversion to left total hip arthroplasty, posterior approach   Plan: -Patient improving. Hypotension resolving. Pain better controlled. Plan to work with PT for weightbearing. Hospitalist consulting. -Weightbearing status: WBAT -Monitor H/H: 10.1/34.1 (inc) -PT/OT: Continue visits -DVT ppx: 81mg  ASA bid x 6 weeks (patient also on Plavix  per PCP) -Pain control: po Dilaudid  -ABX: Received 24 hours of Ancef  Patient with cloudy urine per nurse. Cath catch sent for UA with reflex culture. May have to treat with antibiotic for UTI. -Disposition: Plan for discharge to rehab/SNF [facility] team assisting   Lucie Stabs, [place] [facility]  [facility]  8:27 AM 04/03/24

## 2024-04-03 NOTE — Progress Notes (Signed)
 Physical Therapy Treatment Patient Details Name: Kerry Powell MRN: [id number] DOB: [birth year] Today's Date: 04/03/2024   History of Present Illness Pt is a 79 yo F diagnosed with mechanical complication associated with orthopedic device from prior LLE cephalomedullary nailing and is s/p conversion to L THA.  PMH also includes: HTN, CAD, PVD, DM, CHF, MI, COPD, and bipolar disorder.    PT Comments  Pt was pleasant and motivated to participate during the session and put forth good effort throughout. Pt continued to require extensive assistance with bed mobility tasks.  Pt required Mod A with transfers with cuing for proper sequencing for hip precaution compliance and was able to take a few very small, effortful lateral steps at the EOB before fatiguing and needing to return to sitting.  Pt was able to clear the floor while advancing her RLE but needed to shuffle her L foot despite being able to perform some low clearance standing marches in place with her LLE.  Pt could not combine L hip flex with adduction at this time.  Pt reported no adverse symptoms during the session other than mod L hip/thigh pain with SpO2 around 92-93% and HR WNL throughout on 2LO2/min.  Pt will benefit from continued PT services upon discharge to safely address deficits listed in patient problem list for decreased caregiver assistance and eventual return to PLOF.       If plan is discharge home, recommend the following: Two people to help with walking and/or transfers;A lot of help with bathing/dressing/bathroom;Assistance with cooking/housework;Direct supervision/assist for medications management;Assist for transportation;Help with stairs or ramp for entrance   Can travel by private vehicle     No  Equipment Recommendations  Other (comment) (TBD at next venue of care)    Recommendations for Other Services       Precautions / Restrictions Precautions Precautions: Fall;Posterior Hip Precaution Booklet Issued: Yes  (comment) Restrictions Weight Bearing Restrictions Per Provider Order: Yes LLE Weight Bearing Per Provider Order: Weight bearing as tolerated     Mobility  Bed Mobility Overal bed mobility: Needs Assistance Bed Mobility: Supine to Sit, Sit to Supine     Supine to sit: Max assist Sit to supine: Max assist   General bed mobility comments: Max A for BLE and trunk control    Transfers Overall transfer level: Needs assistance Equipment used: Rolling walker (2 wheels) Transfers: Sit to/from Stand Sit to Stand: Mod assist, From elevated surface           General transfer comment: Multi-modal cues for sequencing for hip precaution compliance    Ambulation/Gait Ambulation/Gait assistance: Min assist Gait Distance (Feet): 1 Feet Assistive device: Rolling walker (2 wheels) Gait Pattern/deviations: Step-to pattern, Antalgic Gait velocity: decreased     General Gait Details: Pt remains able to advance the non-surgical RLE with greater ease than her surgical leg; was able to take several very small, effortful side steps at the EOB before fatiguing and requesting to return to sittig; mostly shuffling steps with the L foot   Stairs             Wheelchair Mobility     Tilt Bed    Modified Rankin (Stroke Patients Only)       Balance Overall balance assessment: Needs assistance Sitting-balance support: Feet supported, Single extremity supported Sitting balance-Leahy Scale: Good     Standing balance support: Bilateral upper extremity supported, Reliant on assistive device for balance, During functional activity Standing balance-Leahy Scale: Fair  Communication Communication Communication: No apparent difficulties  Cognition Arousal: Alert Behavior During Therapy: WFL for tasks assessed/performed   PT - Cognitive impairments: No apparent impairments                         Following commands: Intact       Cueing Cueing Techniques: Verbal cues, Visual cues, Tactile cues  Exercises Total Joint Exercises Hip ABduction/ADduction: AAROM, Strengthening, 5 reps, Both Straight Leg Raises: AAROM, Strengthening, Both, 5 reps Long Arc Quad: AROM, Strengthening, Both, 10 reps (limited knee flex AROM due to pain on the LLE) Knee Flexion: AROM, Strengthening, Both, 10 reps (limited knee flex AROM due to pain on the LLE) Marching in Standing: AROM, Left, 5 reps, Standing (limited clearance) Other Exercises Other Exercises: Hip precaution education and review with pt recalling 0/3 precautions and 1/3 with cuing    General Comments        Pertinent Vitals/Pain Pain Assessment Pain Assessment: 0-10 Pain Score: 4  Pain Location: L hip/thigh Pain Descriptors / Indicators: Aching, Sore Pain Intervention(s): Premedicated before session, Monitored during session, Repositioned, Ice applied    Home Living                          Prior Function            PT Goals (current goals can now be found in the care plan section) Progress towards PT goals: Progressing toward goals    Frequency    7X/week      PT Plan      Co-evaluation              AM-PAC PT 6 Clicks Mobility   Outcome Measure  Help needed turning from your back to your side while in a flat bed without using bedrails?: A Lot Help needed moving from lying on your back to sitting on the side of a flat bed without using bedrails?: Total Help needed moving to and from a bed to a chair (including a wheelchair)?: Total Help needed standing up from a chair using your arms (e.g., wheelchair or bedside chair)?: A Lot Help needed to walk in hospital room?: Total Help needed climbing 3-5 steps with a railing? : Total 6 Click Score: 8    End of Session Equipment Utilized During Treatment: Gait belt;Oxygen Activity Tolerance: Patient tolerated treatment well Patient left: in bed;with call bell/phone within reach;with  bed alarm set;with SCD's reapplied;Other (comment) (Ice bags at L hip as found; abd setup using two pillows) Nurse Communication: Mobility status;Weight bearing status;Precautions PT Visit Diagnosis: Unsteadiness on feet (R26.81);Other abnormalities of gait and mobility (R26.89);Muscle weakness (generalized) (M62.81);Pain Pain - Right/Left: Left Pain - part of body: Hip     Time: 1341-1407 PT Time Calculation (min) (ACUTE ONLY): 26 min  Charges:    $Therapeutic Exercise: 8-22 mins $Therapeutic Activity: 8-22 mins PT General Charges $$ ACUTE PT VISIT: 1 Visit                     D. Scott Truda Staub PT, DPT 04/03/24, 2:57 PM

## 2024-04-03 NOTE — Plan of Care (Signed)
" °  Problem: Education: Goal: Knowledge of the prescribed therapeutic regimen will improve Outcome: Progressing   Problem: Bowel/Gastric: Goal: Gastrointestinal status for postoperative course will improve Outcome: Progressing   Problem: Cardiac: Goal: Ability to maintain an adequate cardiac output Outcome: Progressing Goal: Will show no evidence of cardiac arrhythmias Outcome: Progressing   "

## 2024-04-03 NOTE — Progress Notes (Signed)
 The above named patient is recommended to go to Short Term Rehab for strengthening and gait training for balance.  It is expected that the Short Term Rehab stay will be less than 30 days.  The patient is expected to return home after Rehab.

## 2024-04-03 NOTE — TOC Progression Note (Addendum)
 Transition of Care [facility]) - Progression Note    Patient Details  Name: Kerry Powell MRN: [id number] Date of Birth: [birth year]  Transition of [facility]) CM/SW Contact  Victory Jackquline RAMAN, RN Phone Number: 04/03/2024, 12:25 PM  Clinical Narrative:    RNCM received a message from bedside nurse via secure chat informing me that the family was at bedside and wanted to speak to me. RNCM met with patient, son and daughter-in-law at bedside, introduced myself and my role and explained that discharge planning would be discussed. Informed them the [facility] bed offer was still pending and would let them know once they have responded. RNCM reached out to [place] at [facility] @ [phone] left a message. Awaiting a call back. Per nurse patient is not medically stable for discharge and will be transferred to the first floor. RNCM will continue to follow for discharge planning needs.                      Expected Discharge Plan and Services                                               Social Drivers of Health (SDOH) Interventions SDOH Screenings   Food Insecurity: No Food Insecurity (04/01/2024)  Housing: Low Risk (04/01/2024)  Transportation Needs: No Transportation Needs (04/01/2024)  Utilities: Not At Risk (04/01/2024)  Alcohol  Screen: Low Risk (05/15/2023)  Depression (PHQ2-9): High Risk (03/13/2024)  Financial Resource Strain: Low Risk (12/08/2023)  Physical Activity: Inactive (12/08/2023)  Social Connections: Socially Isolated (04/01/2024)  Stress: Stress Concern Present (12/08/2023)  Tobacco Use: Medium Risk (04/01/2024)  Health Literacy: Adequate Health Literacy (05/15/2023)    Readmission Risk Interventions     No data to display

## 2024-04-03 NOTE — Progress Notes (Signed)
" °  Hospitalist Consult Daily PROGRESS NOTE    Kerry Powell  FMW:[id number] DOB: [birth year] DOA: 04/01/2024 PCP: Kerry Glade PARAS, MD  [id number]  LOS: 0 days   Brief hospital course:   Assessment & Plan: Kerry Powell is a 79 y.o. female with past medical history of COPD with chronic hypoxic respiratory failure on 2 L as needed, CAD, chronic orthostatic hypotension, PAD, CKD stage IIIb, presented with elective left hip surgery.   Pt was found to be very sleepy the day after surgery, TRH was therefore consulted.   Somnolent - Neurological exam nonfocal, clinically suspect somnolent secondary to pain medication/narcotics.   --pt alert and oriented today.  Mental status at baseline.   Hypotension History of orthostatic hypotension - Appears to be euvolemic - Postop hypotension again suspect to be related to surgical pain and narcotics. --cont midodrine   Sinus tachycardia --HR elevated likely due to pain and stress from surgery --cont home Toprol   Acute urinary retention --Had needed 2 I/O cath already with >500 ml urine removed.  pt reported similar issue last post-op --Insert Foley --Voiding trial in 5-7 days   CKD stage IIIb --creatinine stable   COPD Chronic hypoxemic respiratory failure on 2L O2 PRN - No symptoms or signs of acute exacerbation - Incentive spirometry   Chronic HFpEF - Euvolemic - Continue small dose of metoprolol    IIDM --last A1c 6.4.   --No need for BG checks. Outpatient Moun[facility]o  therapy   TR[facility] will continue to follow the patient.   Subjective and Interval History:  Pt reported severe pain in her hip that's only helped by dilaudid .  Mental status much improved today.  Pt reported urinary retention which she also experience for several days after her last hip surgery.     Objective: Vitals:   04/03/24 1451 04/03/24 1454 04/03/24 1455 04/03/24 1622  BP: 117/60   (!) 110/48  Pulse: (!) 111 (!) 110 (!) 110 (!) 102  Resp: 20     Temp: 98 F  (36.7 C)   (!) 97.1 F (36.2 C)  TempSrc:      SpO2: 96%  98% 92%  Weight: 84.4 kg     Height: 5' 5.5 (1.664 m)       Intake/Output Summary (Last 24 hours) at 04/03/2024 1956 Last data filed at 04/03/2024 1700 Gross per 24 hour  Intake 0 ml  Output 800 ml  Net -800 ml   Filed Weights   04/01/24 0937 04/03/24 1451  Weight: 75.8 kg 84.4 kg    Examination:   Constitutional: NAD, alert, oriented HEENT: conjunctivae and lids normal, EOMI CV: No cyanosis.   RESP: normal respiratory effort, on 2L Neuro: II - XII grossly intact.   Psych: Normal mood and affect.  Appropriate judgement and reason   Data Reviewed: I have personally reviewed labs and imaging studies  Time spent: 50 minutes  Kerry Haber, MD [facility] If 7PM-7AM, please contact night-coverage 04/03/2024, 7:56 PM   "

## 2024-06-10 ENCOUNTER — Encounter: Admitting: Internal Medicine

## 2024-06-10 ENCOUNTER — Ambulatory Visit
